# Patient Record
Sex: Female | Born: 1940
Health system: Southern US, Community
[De-identification: ages and names within clinical notes are randomized; demographics above are authoritative.]

## PROBLEM LIST (undated history)

## (undated) DIAGNOSIS — Z923 Personal history of irradiation: Secondary | ICD-10-CM

## (undated) DIAGNOSIS — R7309 Other abnormal glucose: Secondary | ICD-10-CM

## (undated) DIAGNOSIS — I10 Essential (primary) hypertension: Secondary | ICD-10-CM

## (undated) DIAGNOSIS — K219 Gastro-esophageal reflux disease without esophagitis: Secondary | ICD-10-CM

## (undated) DIAGNOSIS — E785 Hyperlipidemia, unspecified: Secondary | ICD-10-CM

## (undated) DIAGNOSIS — H269 Unspecified cataract: Secondary | ICD-10-CM

## (undated) DIAGNOSIS — D219 Benign neoplasm of connective and other soft tissue, unspecified: Secondary | ICD-10-CM

## (undated) DIAGNOSIS — M8580 Other specified disorders of bone density and structure, unspecified site: Secondary | ICD-10-CM

## (undated) DIAGNOSIS — C9110 Chronic lymphocytic leukemia of B-cell type not having achieved remission: Secondary | ICD-10-CM

## (undated) DIAGNOSIS — C8590 Non-Hodgkin lymphoma, unspecified, unspecified site: Secondary | ICD-10-CM

## (undated) HISTORY — DX: Hyperlipidemia, unspecified: E78.5

## (undated) HISTORY — DX: Other specified disorders of bone density and structure, unspecified site: M85.80

## (undated) HISTORY — DX: Gastro-esophageal reflux disease without esophagitis: K21.9

## (undated) HISTORY — PX: ABDOMINAL HYSTERECTOMY: SHX81

## (undated) HISTORY — PX: TONSILLECTOMY AND ADENOIDECTOMY: SUR1326

## (undated) HISTORY — DX: Unspecified cataract: H26.9

## (undated) HISTORY — DX: Chronic lymphocytic leukemia of B-cell type not having achieved remission: C91.10

## (undated) HISTORY — DX: Benign neoplasm of connective and other soft tissue, unspecified: D21.9

## (undated) HISTORY — DX: Other abnormal glucose: R73.09

## (undated) HISTORY — DX: Essential (primary) hypertension: I10

## (undated) HISTORY — PX: APPENDECTOMY: SHX54

---

## 1997-10-28 ENCOUNTER — Ambulatory Visit (HOSPITAL_COMMUNITY): Admission: RE | Admit: 1997-10-28 | Discharge: 1997-10-28 | Payer: Self-pay | Admitting: Gastroenterology

## 1997-11-05 ENCOUNTER — Ambulatory Visit (HOSPITAL_COMMUNITY): Admission: RE | Admit: 1997-11-05 | Discharge: 1997-11-05 | Payer: Self-pay | Admitting: Obstetrics and Gynecology

## 1998-11-16 ENCOUNTER — Ambulatory Visit (HOSPITAL_COMMUNITY): Admission: RE | Admit: 1998-11-16 | Discharge: 1998-11-16 | Payer: Self-pay | Admitting: Obstetrics and Gynecology

## 1998-11-16 ENCOUNTER — Encounter: Payer: Self-pay | Admitting: Obstetrics and Gynecology

## 1999-11-17 ENCOUNTER — Ambulatory Visit (HOSPITAL_COMMUNITY): Admission: RE | Admit: 1999-11-17 | Discharge: 1999-11-17 | Payer: Self-pay | Admitting: Obstetrics and Gynecology

## 1999-11-17 ENCOUNTER — Encounter: Payer: Self-pay | Admitting: Obstetrics and Gynecology

## 1999-11-29 ENCOUNTER — Other Ambulatory Visit: Admission: RE | Admit: 1999-11-29 | Discharge: 1999-11-29 | Payer: Self-pay | Admitting: Obstetrics and Gynecology

## 2000-10-19 ENCOUNTER — Encounter: Payer: Self-pay | Admitting: Obstetrics and Gynecology

## 2000-10-19 ENCOUNTER — Encounter: Admission: RE | Admit: 2000-10-19 | Discharge: 2000-10-19 | Payer: Self-pay | Admitting: Obstetrics and Gynecology

## 2000-11-23 ENCOUNTER — Encounter: Payer: Self-pay | Admitting: Obstetrics and Gynecology

## 2000-11-23 ENCOUNTER — Ambulatory Visit (HOSPITAL_COMMUNITY): Admission: RE | Admit: 2000-11-23 | Discharge: 2000-11-23 | Payer: Self-pay | Admitting: Obstetrics and Gynecology

## 2001-11-15 ENCOUNTER — Other Ambulatory Visit: Admission: RE | Admit: 2001-11-15 | Discharge: 2001-11-15 | Payer: Self-pay | Admitting: Obstetrics and Gynecology

## 2001-11-26 ENCOUNTER — Ambulatory Visit (HOSPITAL_COMMUNITY): Admission: RE | Admit: 2001-11-26 | Discharge: 2001-11-26 | Payer: Self-pay | Admitting: Obstetrics and Gynecology

## 2001-11-26 ENCOUNTER — Encounter: Payer: Self-pay | Admitting: Obstetrics and Gynecology

## 2001-12-10 ENCOUNTER — Ambulatory Visit (HOSPITAL_COMMUNITY): Admission: RE | Admit: 2001-12-10 | Discharge: 2001-12-10 | Payer: Self-pay | Admitting: Gastroenterology

## 2002-11-29 ENCOUNTER — Encounter: Payer: Self-pay | Admitting: Obstetrics and Gynecology

## 2002-11-29 ENCOUNTER — Ambulatory Visit (HOSPITAL_COMMUNITY): Admission: RE | Admit: 2002-11-29 | Discharge: 2002-11-29 | Payer: Self-pay | Admitting: Obstetrics and Gynecology

## 2003-12-03 ENCOUNTER — Ambulatory Visit (HOSPITAL_COMMUNITY): Admission: RE | Admit: 2003-12-03 | Discharge: 2003-12-03 | Payer: Self-pay | Admitting: Obstetrics and Gynecology

## 2004-12-02 ENCOUNTER — Ambulatory Visit (HOSPITAL_COMMUNITY): Admission: RE | Admit: 2004-12-02 | Discharge: 2004-12-02 | Payer: Self-pay | Admitting: Internal Medicine

## 2005-08-02 ENCOUNTER — Encounter: Admission: RE | Admit: 2005-08-02 | Discharge: 2005-08-02 | Payer: Self-pay | Admitting: Obstetrics and Gynecology

## 2005-10-01 ENCOUNTER — Emergency Department (HOSPITAL_COMMUNITY): Admission: EM | Admit: 2005-10-01 | Discharge: 2005-10-01 | Payer: Self-pay | Admitting: Emergency Medicine

## 2005-12-09 ENCOUNTER — Ambulatory Visit (HOSPITAL_COMMUNITY): Admission: RE | Admit: 2005-12-09 | Discharge: 2005-12-09 | Payer: Self-pay | Admitting: Obstetrics and Gynecology

## 2006-12-15 ENCOUNTER — Ambulatory Visit (HOSPITAL_COMMUNITY): Admission: RE | Admit: 2006-12-15 | Discharge: 2006-12-15 | Payer: Self-pay | Admitting: Obstetrics and Gynecology

## 2007-05-07 ENCOUNTER — Encounter: Admission: RE | Admit: 2007-05-07 | Discharge: 2007-05-07 | Payer: Self-pay | Admitting: Obstetrics and Gynecology

## 2007-12-18 ENCOUNTER — Ambulatory Visit (HOSPITAL_COMMUNITY): Admission: RE | Admit: 2007-12-18 | Discharge: 2007-12-18 | Payer: Self-pay | Admitting: Obstetrics and Gynecology

## 2008-12-18 ENCOUNTER — Ambulatory Visit (HOSPITAL_COMMUNITY): Admission: RE | Admit: 2008-12-18 | Discharge: 2008-12-18 | Payer: Self-pay | Admitting: Obstetrics and Gynecology

## 2009-12-21 ENCOUNTER — Ambulatory Visit (HOSPITAL_COMMUNITY): Admission: RE | Admit: 2009-12-21 | Discharge: 2009-12-21 | Payer: Self-pay | Admitting: Obstetrics and Gynecology

## 2010-04-04 HISTORY — PX: PAROTID GLAND TUMOR EXCISION: SHX5221

## 2010-06-15 ENCOUNTER — Other Ambulatory Visit (HOSPITAL_COMMUNITY)
Admission: RE | Admit: 2010-06-15 | Discharge: 2010-06-15 | Disposition: A | Discharge status: Home / Self Care | Payer: Medicare Other | Source: Ambulatory Visit | Attending: Otolaryngology | Admitting: Otolaryngology

## 2010-06-15 ENCOUNTER — Other Ambulatory Visit: Payer: Self-pay | Admitting: Otolaryngology

## 2010-06-15 DIAGNOSIS — R220 Localized swelling, mass and lump, head: Secondary | ICD-10-CM | POA: Insufficient documentation

## 2010-06-25 ENCOUNTER — Other Ambulatory Visit: Payer: Self-pay | Admitting: Otolaryngology

## 2010-07-02 ENCOUNTER — Ambulatory Visit
Admission: RE | Admit: 2010-07-02 | Discharge: 2010-07-02 | Disposition: A | Discharge status: Home / Self Care | Payer: Medicare Other | Source: Ambulatory Visit | Attending: Otolaryngology | Admitting: Otolaryngology

## 2010-07-02 MED ORDER — IOHEXOL 300 MG/ML  SOLN
75.0000 mL | Freq: Once | INTRAMUSCULAR | Status: AC | PRN
  Administered 2010-07-02: 75 mL via INTRAVENOUS

## 2010-09-08 ENCOUNTER — Other Ambulatory Visit: Payer: Self-pay | Admitting: Otolaryngology

## 2010-09-08 ENCOUNTER — Other Ambulatory Visit (HOSPITAL_COMMUNITY)
Admission: RE | Admit: 2010-09-08 | Discharge: 2010-09-08 | Disposition: A | Discharge status: Home / Self Care | Payer: Medicare Other | Source: Ambulatory Visit | Attending: Otolaryngology | Admitting: Otolaryngology

## 2010-09-08 DIAGNOSIS — R220 Localized swelling, mass and lump, head: Secondary | ICD-10-CM | POA: Insufficient documentation

## 2010-09-08 DIAGNOSIS — R221 Localized swelling, mass and lump, neck: Secondary | ICD-10-CM | POA: Insufficient documentation

## 2010-10-25 ENCOUNTER — Encounter (HOSPITAL_COMMUNITY)
Admission: RE | Admit: 2010-10-25 | Discharge: 2010-10-25 | Disposition: A | Discharge status: Home / Self Care | Payer: Medicare Other | Source: Ambulatory Visit | Attending: Otolaryngology | Admitting: Otolaryngology

## 2010-10-25 ENCOUNTER — Other Ambulatory Visit (HOSPITAL_COMMUNITY): Payer: Self-pay | Admitting: Otolaryngology

## 2010-10-25 ENCOUNTER — Ambulatory Visit (HOSPITAL_COMMUNITY)
Admission: RE | Admit: 2010-10-25 | Discharge: 2010-10-25 | Disposition: A | Discharge status: Home / Self Care | Payer: Medicare Other | Source: Ambulatory Visit | Attending: Otolaryngology | Admitting: Otolaryngology

## 2010-10-25 DIAGNOSIS — D490 Neoplasm of unspecified behavior of digestive system: Secondary | ICD-10-CM

## 2010-10-25 DIAGNOSIS — Z01812 Encounter for preprocedural laboratory examination: Secondary | ICD-10-CM | POA: Insufficient documentation

## 2010-10-25 DIAGNOSIS — Z01818 Encounter for other preprocedural examination: Secondary | ICD-10-CM | POA: Insufficient documentation

## 2010-10-25 DIAGNOSIS — C768 Malignant neoplasm of other specified ill-defined sites: Secondary | ICD-10-CM | POA: Insufficient documentation

## 2010-10-25 LAB — BASIC METABOLIC PANEL
BUN: 17 mg/dL (ref 6–23)
CO2: 30 mEq/L (ref 19–32)
Calcium: 10.4 mg/dL (ref 8.4–10.5)
Chloride: 102 mEq/L (ref 96–112)
Creatinine, Ser: 1.05 mg/dL (ref 0.50–1.10)
GFR calc Af Amer: >60 mL/min (ref >60)
GFR calc non Af Amer: 52 mL/min — ABNORMAL LOW (ref >60)
Glucose, Bld: 103 mg/dL — ABNORMAL HIGH (ref 70–99)
Potassium: 4.4 mEq/L (ref 3.5–5.1)
Sodium: 141 mEq/L (ref 135–145)

## 2010-10-25 LAB — CBC
HCT: 43.9 % (ref 36.0–46.0)
Hemoglobin: 15.3 g/dL — ABNORMAL HIGH (ref 12.0–15.0)
MCH: 31.2 pg (ref 26.0–34.0)
MCHC: 34.9 g/dL (ref 30.0–36.0)
MCV: 89.6 fL (ref 78.0–100.0)
Platelets: 188 10*3/uL (ref 150–400)
RBC: 4.9 MIL/uL (ref 3.87–5.11)
RDW: 13.1 % (ref 11.5–15.5)
WBC: 6.1 10*3/uL (ref 4.0–10.5)

## 2010-10-25 LAB — SURGICAL PCR SCREEN
MRSA, PCR: NEGATIVE
Staphylococcus aureus: NEGATIVE

## 2010-11-03 ENCOUNTER — Other Ambulatory Visit: Payer: Self-pay | Admitting: Otolaryngology

## 2010-11-03 ENCOUNTER — Ambulatory Visit (HOSPITAL_COMMUNITY)
Admission: RE | Admit: 2010-11-03 | Discharge: 2010-11-04 | Disposition: A | Discharge status: Home / Self Care | Payer: Medicare Other | Source: Ambulatory Visit | Attending: Otolaryngology | Admitting: Otolaryngology

## 2010-11-03 DIAGNOSIS — Z01812 Encounter for preprocedural laboratory examination: Secondary | ICD-10-CM | POA: Insufficient documentation

## 2010-11-03 DIAGNOSIS — Z01810 Encounter for preprocedural cardiovascular examination: Secondary | ICD-10-CM | POA: Insufficient documentation

## 2010-11-03 DIAGNOSIS — I10 Essential (primary) hypertension: Secondary | ICD-10-CM | POA: Insufficient documentation

## 2010-11-03 DIAGNOSIS — D119 Benign neoplasm of major salivary gland, unspecified: Secondary | ICD-10-CM | POA: Insufficient documentation

## 2010-11-03 DIAGNOSIS — Z01818 Encounter for other preprocedural examination: Secondary | ICD-10-CM | POA: Insufficient documentation

## 2010-12-07 NOTE — Op Note (Signed)
NAMESELEEN, WALTER                   ACCOUNT NO.:  [id number]  MEDICAL RECORD NO.:  [id number]  LOCATION:  5127                         FACILITY:  [facility]  PHYSICIAN:  Gloris Manchester. Lazarus Salines, M.D. DATE OF BIRTH:  [birth year]  DATE OF PROCEDURE:  11/03/2010 DATE OF DISCHARGE:                              OPERATIVE REPORT   PREOPERATIVE DIAGNOSIS:  Right parotid tumor.  POSTOPERATIVE DIAGNOSIS:  Right parotid tumor.  PROCEDURE PERFORMED:  Right superficial parotidectomy with facial nerve preservation.  SURGEON:  Gloris Manchester. Lazarus Salines, MD  ASSISTANT:  Aquilla Hacker, [facility]  ANESTHESIA:  General orotracheal.  BLOOD LOSS:  Less than 20 mL.  COMPLICATIONS:  None.  FINDINGS:  A 2.5-cm rubbery tumor in the tail of the right parotid gland.  Some fatty transformation of the glandular tissue.  Facial nerve identified and preserved.  PROCEDURE:  With the patient in a comfortable supine position, general orotracheal anesthesia was induced without difficulty.  At an appropriate level, the patient was placed in a slight reverse Trendelenburg.  The head was rotated to the left for access to the right neck.  A shoulder roll was placed and the neck was extended and the head supported in the standard fashion.  The NIMS system was applied in the standard fashion.  The neck was palpated with the findings as described above.  1% Xylocaine with 1:100,000 epinephrine, 7 mL total was infiltrated along the proposed wound line for intraoperative hemostasis. Several minutes were allowed for this to take effect.  A sterile preparation and draping of the right neck was accomplished in the standard fashion.  Crosshatches were applied on the transverse portion of the wound and at the ear lobule.  A modified Blair incision was sharply executed and carried down through the skin and subcutaneous fat.  The periparotid plane was dissected forward.  The skin flaps were dissected off the sternocleidomastoid muscle  inferiorly and posteriorly.  The earlobe was developed from the cartilaginous canal.  The earlobe was held backwards and the anterior flap was held forward with 2-0 silk tieback sutures.  Using the [facility] thermal scalpel, the plane in front of the cartilaginous canal was developed on a broad front up into the preauricular region, down behind the small tail of the gland and then down to the sternocleidomastoid muscle.  The tail of the gland was developed forward and the parotid gland including tumor rolled forward off the sternocleidomastoid muscle.  Working on a broad front, the plane was deepened to the bony canal.  Vessels were controlled with bipolar cautery or with a Shaw scalpel as identified.  Digastric muscle was identified.  Working in the notch between the external canal and the mastoid bone, the facial nerve was identified.  Using moist sponges and the [facility], the nerve was dissected forward and the midportion of the gland was incised using the Shaw scalpel.  The pes anserinus was identified.  The location of the tumor was such that it was felt to be appropriate to dissect along the inferior branch forward and leave the superior gland and branch alone.  The superior-most branch of the inferior branch was developed forward using the [facility] scalpel and  the gland was rolled downward away from the branch.  A second branch in the same area was developed in the same fashion.  A more inferior branch was developed and finally a fourth branch probably the cervical branch was developed forward.  Hemostasis was achieved with a Shaw scalpel, and with occasional small arteries and veins with a 3-0 silk ligature.  Care was taken not to violate the tumor which was quite anteroinferior in the gland.  The gland and tumor itself were developed forward off the sternocleidomastoid muscle and finally off the deep lobe of the parotid gland keeping the dissected facial nerve in direct vision.   The anterior aspect of the gland was dissected downward off the mastoid muscle and communicated with the inferior portion and then the specimen was fully excised.  The tumor was intact and had not been violated.  The facial nerve was also intact. Hemostasis was observed.  The specimen was sent for permanent pathologic interpretation.  The wound was irrigated.  Valsalva revealed no further bleeding.  A 10- French drain was placed in the depth of the wound.  The wound was reapproximated with interrupted 4-0 chromic suture in the subcutaneous layer, the drain was secured with a 3-0 nylon stitch, and the wound was closed in a cosmetic fashion with a running simple 5-0 Ethilon.  A good cosmetic reapproximation was accomplished.  The bulk of the gland was allowed to remain behind and a cosmetic contour of the cheek was excellent.  Hemostasis was observed and the drain was functioning adequately.  At this point, the procedure was completed.  The patient was returned to Anesthesia, awakened, extubated, and transferred to recovery in stable condition.  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 Anticipate routine postoperative recovery with attention to ice, elevation, analgesia, and suction drainage x24 hours.     Gloris Manchester. Lazarus Salines, M.D.     KTW/MEDQ  D:  11/03/2010  T:  11/03/2010  Job:  [id number]  cc:   Lovenia Kim, D.O.  Electronically Signed by Flo Shanks M.D. on 12/07/2010 09:55:12 AM

## 2010-12-13 ENCOUNTER — Other Ambulatory Visit (HOSPITAL_COMMUNITY): Payer: Self-pay | Admitting: Internal Medicine

## 2010-12-13 DIAGNOSIS — Z139 Encounter for screening, unspecified: Secondary | ICD-10-CM

## 2010-12-23 ENCOUNTER — Ambulatory Visit (HOSPITAL_COMMUNITY)
Admission: RE | Admit: 2010-12-23 | Discharge: 2010-12-23 | Disposition: A | Discharge status: Home / Self Care | Payer: Medicare Other | Source: Ambulatory Visit | Attending: Internal Medicine | Admitting: Internal Medicine

## 2010-12-23 DIAGNOSIS — Z139 Encounter for screening, unspecified: Secondary | ICD-10-CM

## 2010-12-23 DIAGNOSIS — Z1231 Encounter for screening mammogram for malignant neoplasm of breast: Secondary | ICD-10-CM | POA: Insufficient documentation

## 2011-03-05 LAB — HM PAP SMEAR: HM Pap smear: NORMAL

## 2011-05-31 ENCOUNTER — Ambulatory Visit (INDEPENDENT_AMBULATORY_CARE_PROVIDER_SITE_OTHER): Payer: Medicare Other | Admitting: Obstetrics and Gynecology

## 2011-05-31 DIAGNOSIS — Z124 Encounter for screening for malignant neoplasm of cervix: Secondary | ICD-10-CM

## 2011-11-25 ENCOUNTER — Other Ambulatory Visit (HOSPITAL_COMMUNITY): Payer: Self-pay | Admitting: Internal Medicine

## 2011-11-25 DIAGNOSIS — Z1231 Encounter for screening mammogram for malignant neoplasm of breast: Secondary | ICD-10-CM

## 2011-12-04 LAB — HM MAMMOGRAPHY: HM Mammogram: NORMAL

## 2011-12-26 ENCOUNTER — Ambulatory Visit (HOSPITAL_COMMUNITY)
Admission: RE | Admit: 2011-12-26 | Discharge: 2011-12-26 | Disposition: A | Discharge status: Home / Self Care | Payer: Medicare Other | Source: Ambulatory Visit | Attending: Internal Medicine | Admitting: Internal Medicine

## 2011-12-26 DIAGNOSIS — Z1231 Encounter for screening mammogram for malignant neoplasm of breast: Secondary | ICD-10-CM | POA: Insufficient documentation

## 2012-04-04 LAB — HM DEXA SCAN

## 2012-04-19 LAB — HM COLONOSCOPY

## 2012-11-21 ENCOUNTER — Other Ambulatory Visit (HOSPITAL_COMMUNITY): Payer: Self-pay | Admitting: Internal Medicine

## 2012-11-21 DIAGNOSIS — Z1231 Encounter for screening mammogram for malignant neoplasm of breast: Secondary | ICD-10-CM

## 2012-12-26 ENCOUNTER — Ambulatory Visit (HOSPITAL_COMMUNITY)
Admission: RE | Admit: 2012-12-26 | Discharge: 2012-12-26 | Disposition: A | Discharge status: Home / Self Care | Payer: Medicare Other | Source: Ambulatory Visit | Attending: Internal Medicine | Admitting: Internal Medicine

## 2012-12-26 DIAGNOSIS — Z1231 Encounter for screening mammogram for malignant neoplasm of breast: Secondary | ICD-10-CM

## 2013-02-22 ENCOUNTER — Encounter: Payer: Self-pay | Admitting: *Deleted

## 2013-02-22 DIAGNOSIS — I10 Essential (primary) hypertension: Secondary | ICD-10-CM | POA: Insufficient documentation

## 2013-02-22 DIAGNOSIS — K219 Gastro-esophageal reflux disease without esophagitis: Secondary | ICD-10-CM | POA: Insufficient documentation

## 2013-02-22 DIAGNOSIS — M8580 Other specified disorders of bone density and structure, unspecified site: Secondary | ICD-10-CM | POA: Insufficient documentation

## 2013-02-22 DIAGNOSIS — E785 Hyperlipidemia, unspecified: Secondary | ICD-10-CM | POA: Insufficient documentation

## 2013-02-22 DIAGNOSIS — R7309 Other abnormal glucose: Secondary | ICD-10-CM | POA: Insufficient documentation

## 2013-02-25 ENCOUNTER — Ambulatory Visit: Payer: Medicare Other | Admitting: Emergency Medicine

## 2013-02-25 ENCOUNTER — Encounter: Payer: Self-pay | Admitting: Emergency Medicine

## 2013-02-25 VITALS — BP 144/82 | HR 68 | Temp 97.8°F | Resp 16 | Ht 60.24 in | Wt 153.0 lb

## 2013-02-25 DIAGNOSIS — I10 Essential (primary) hypertension: Secondary | ICD-10-CM

## 2013-02-25 DIAGNOSIS — R7309 Other abnormal glucose: Secondary | ICD-10-CM

## 2013-02-25 DIAGNOSIS — E538 Deficiency of other specified B group vitamins: Secondary | ICD-10-CM

## 2013-02-25 DIAGNOSIS — E559 Vitamin D deficiency, unspecified: Secondary | ICD-10-CM

## 2013-02-25 DIAGNOSIS — Z0000 Encounter for general adult medical examination without abnormal findings: Secondary | ICD-10-CM

## 2013-02-25 DIAGNOSIS — E782 Mixed hyperlipidemia: Secondary | ICD-10-CM

## 2013-02-25 DIAGNOSIS — R5381 Other malaise: Secondary | ICD-10-CM

## 2013-02-25 LAB — VITAMIN B12: Vitamin B-12: 503 pg/mL (ref 211–911)

## 2013-02-25 LAB — BASIC METABOLIC PANEL WITH GFR
BUN: 16 mg/dL (ref 6–23)
CO2: 27 mEq/L (ref 19–32)
Calcium: 10.5 mg/dL (ref 8.4–10.5)
Chloride: 103 mEq/L (ref 96–112)
Creat: 1.08 mg/dL (ref 0.50–1.10)
GFR, Est African American: 59 mL/min — ABNORMAL LOW
GFR, Est Non African American: 51 mL/min — ABNORMAL LOW
Glucose, Bld: 93 mg/dL (ref 70–99)
Potassium: 4.4 mEq/L (ref 3.5–5.3)
Sodium: 140 mEq/L (ref 135–145)

## 2013-02-25 LAB — CBC WITH DIFFERENTIAL/PLATELET
Basophils Absolute: 0 10*3/uL (ref 0.0–0.1)
Basophils Relative: 1 % (ref 0–1)
Eosinophils Absolute: 0.1 10*3/uL (ref 0.0–0.7)
Eosinophils Relative: 2 % (ref 0–5)
HCT: 43 % (ref 36.0–46.0)
Hemoglobin: 14.9 g/dL (ref 12.0–15.0)
Lymphocytes Relative: 46 % (ref 12–46)
Lymphs Abs: 2.2 10*3/uL (ref 0.7–4.0)
MCH: 30.2 pg (ref 26.0–34.0)
MCHC: 34.7 g/dL (ref 30.0–36.0)
MCV: 87 fL (ref 78.0–100.0)
Monocytes Absolute: 0.5 10*3/uL (ref 0.1–1.0)
Monocytes Relative: 11 % (ref 3–12)
Neutro Abs: 2 10*3/uL (ref 1.7–7.7)
Neutrophils Relative %: 40 % — ABNORMAL LOW (ref 43–77)
Platelets: 190 10*3/uL (ref 150–400)
RBC: 4.94 MIL/uL (ref 3.87–5.11)
RDW: 14 % (ref 11.5–15.5)
WBC: 4.9 10*3/uL (ref 4.0–10.5)

## 2013-02-25 LAB — HEPATIC FUNCTION PANEL
ALT: 17 U/L (ref 0–35)
AST: 21 U/L (ref 0–37)
Albumin: 4.8 g/dL (ref 3.5–5.2)
Alkaline Phosphatase: 70 U/L (ref 39–117)
Bilirubin, Direct: 0.1 mg/dL (ref 0.0–0.3)
Indirect Bilirubin: 0.6 mg/dL (ref 0.0–0.9)
Total Bilirubin: 0.7 mg/dL (ref 0.3–1.2)
Total Protein: 7.3 g/dL (ref 6.0–8.3)

## 2013-02-25 LAB — HEMOGLOBIN A1C
Hgb A1c MFr Bld: 5.8 % — ABNORMAL HIGH (ref <5.7)
Mean Plasma Glucose: 120 mg/dL — ABNORMAL HIGH (ref <117)

## 2013-02-25 LAB — LIPID PANEL
Cholesterol: 191 mg/dL (ref 0–200)
HDL: 55 mg/dL (ref >39)
LDL Cholesterol: 112 mg/dL — ABNORMAL HIGH (ref 0–99)
Total CHOL/HDL Ratio: 3.5 Ratio
Triglycerides: 121 mg/dL (ref <150)
VLDL: 24 mg/dL (ref 0–40)

## 2013-02-25 LAB — MAGNESIUM: Magnesium: 2.3 mg/dL (ref 1.5–2.5)

## 2013-02-25 LAB — TSH: TSH: 1.579 u[IU]/mL (ref 0.350–4.500)

## 2013-02-25 NOTE — Patient Instructions (Signed)
Fat and Cholesterol Control Diet  Fat and cholesterol levels in your blood and organs are influenced by your diet. High levels of fat and cholesterol may lead to diseases of the heart, small and large blood vessels, gallbladder, liver, and pancreas.  CONTROLLING FAT AND CHOLESTEROL WITH DIET  Although exercise and lifestyle factors are important, your diet is key. That is because certain foods are known to raise cholesterol and others to lower it. The goal is to balance foods for their effect on cholesterol and more importantly, to replace saturated and trans fat with other types of fat, such as monounsaturated fat, polyunsaturated fat, and omega-3 fatty acids.  On average, a person should consume no more than 15 to 17 g of saturated fat daily. Saturated and trans fats are considered "bad" fats, and they will raise LDL cholesterol. Saturated fats are primarily found in animal products such as meats, butter, and cream. However, that does not mean you need to give up all your favorite foods. Today, there are good tasting, low-fat, low-cholesterol substitutes for most of the things you like to eat. Choose low-fat or nonfat alternatives. Choose round or loin cuts of red meat. These types of cuts are lowest in fat and cholesterol. Chicken (without the skin), fish, veal, and ground turkey breast are great choices. Eliminate fatty meats, such as hot dogs and salami. Even shellfish have little or no saturated fat. Have a 3 oz (85 g) portion when you eat lean meat, poultry, or fish.  Trans fats are also called "partially hydrogenated oils." They are oils that have been scientifically manipulated so that they are solid at room temperature resulting in a longer shelf life and improved taste and texture of foods in which they are added. Trans fats are found in stick margarine, some tub margarines, cookies, crackers, and baked goods.   When baking and cooking, oils are a great substitute for butter. The monounsaturated oils are  especially beneficial since it is believed they lower LDL and raise HDL. The oils you should avoid entirely are saturated tropical oils, such as coconut and palm.   Remember to eat a lot from food groups that are naturally free of saturated and trans fat, including fish, fruit, vegetables, beans, grains (barley, rice, couscous, bulgur wheat), and pasta (without cream sauces).   IDENTIFYING FOODS THAT LOWER FAT AND CHOLESTEROL   Soluble fiber may lower your cholesterol. This type of fiber is found in fruits such as apples, vegetables such as broccoli, potatoes, and carrots, legumes such as beans, peas, and lentils, and grains such as barley. Foods fortified with plant sterols (phytosterol) may also lower cholesterol. You should eat at least 2 g per day of these foods for a cholesterol lowering effect.   Read package labels to identify low-saturated fats, trans fat free, and low-fat foods at the supermarket. Select cheeses that have only 2 to 3 g saturated fat per ounce. Use a heart-healthy tub margarine that is free of trans fats or partially hydrogenated oil. When buying baked goods (cookies, crackers), avoid partially hydrogenated oils. Breads and muffins should be made from whole grains (whole-wheat or whole oat flour, instead of "flour" or "enriched flour"). Buy non-creamy canned soups with reduced salt and no added fats.   FOOD PREPARATION TECHNIQUES   Never deep-fry. If you must fry, either stir-fry, which uses very little fat, or use non-stick cooking sprays. When possible, broil, bake, or roast meats, and steam vegetables. Instead of putting butter or margarine on vegetables, use lemon   and herbs, applesauce, and cinnamon (for squash and sweet potatoes). Use nonfat yogurt, salsa, and low-fat dressings for salads.   LOW-SATURATED FAT / LOW-FAT FOOD SUBSTITUTES  Meats / Saturated Fat (g)  · Avoid: Steak, marbled (3 oz/85 g) / 11 g  · Choose: Steak, lean (3 oz/85 g) / 4 g  · Avoid: Hamburger (3 oz/85 g) / 7  g  · Choose: Hamburger, lean (3 oz/85 g) / 5 g  · Avoid: Ham (3 oz/85 g) / 6 g  · Choose: Ham, lean cut (3 oz/85 g) / 2.4 g  · Avoid: Chicken, with skin, dark meat (3 oz/85 g) / 4 g  · Choose: Chicken, skin removed, dark meat (3 oz/85 g) / 2 g  · Avoid: Chicken, with skin, light meat (3 oz/85 g) / 2.5 g  · Choose: Chicken, skin removed, light meat (3 oz/85 g) / 1 g  Dairy / Saturated Fat (g)  · Avoid: Whole milk (1 cup) / 5 g  · Choose: Low-fat milk, 2% (1 cup) / 3 g  · Choose: Low-fat milk, 1% (1 cup) / 1.5 g  · Choose: Skim milk (1 cup) / 0.3 g  · Avoid: Hard cheese (1 oz/28 g) / 6 g  · Choose: Skim milk cheese (1 oz/28 g) / 2 to 3 g  · Avoid: Cottage cheese, 4% fat (1 cup) / 6.5 g  · Choose: Low-fat cottage cheese, 1% fat (1 cup) / 1.5 g  · Avoid: Ice cream (1 cup) / 9 g  · Choose: Sherbet (1 cup) / 2.5 g  · Choose: Nonfat frozen yogurt (1 cup) / 0.3 g  · Choose: Frozen fruit bar / trace  · Avoid: Whipped cream (1 tbs) / 3.5 g  · Choose: Nondairy whipped topping (1 tbs) / 1 g  Condiments / Saturated Fat (g)  · Avoid: Mayonnaise (1 tbs) / 2 g  · Choose: Low-fat mayonnaise (1 tbs) / 1 g  · Avoid: Butter (1 tbs) / 7 g  · Choose: Extra light margarine (1 tbs) / 1 g  · Avoid: Coconut oil (1 tbs) / 11.8 g  · Choose: Olive oil (1 tbs) / 1.8 g  · Choose: Corn oil (1 tbs) / 1.7 g  · Choose: Safflower oil (1 tbs) / 1.2 g  · Choose: Sunflower oil (1 tbs) / 1.4 g  · Choose: Soybean oil (1 tbs) / 2.4 g  · Choose: Canola oil (1 tbs) / 1 g  Document Released: 03/21/2005 Document Revised: 07/16/2012 Document Reviewed: 09/09/2010  Exit[employer]® Patient Information ©2014 [employer].

## 2013-02-26 LAB — URINALYSIS, MICROSCOPIC ONLY
Casts: NONE SEEN
Crystals: NONE SEEN

## 2013-02-26 LAB — URINALYSIS, ROUTINE W REFLEX MICROSCOPIC
Bilirubin Urine: NEGATIVE
Glucose, UA: NEGATIVE mg/dL
Hgb urine dipstick: NEGATIVE
Ketones, ur: NEGATIVE mg/dL
Nitrite: NEGATIVE
Protein, ur: NEGATIVE mg/dL
Specific Gravity, Urine: 1.016 (ref 1.005–1.030)
Urobilinogen, UA: 0.2 mg/dL (ref 0.0–1.0)
pH: 6.5 (ref 5.0–8.0)

## 2013-02-26 LAB — INSULIN, FASTING: Insulin fasting, serum: 20 u[IU]/mL (ref 3–28)

## 2013-02-26 LAB — MICROALBUMIN / CREATININE URINE RATIO
Creatinine, Urine: 130.2 mg/dL
Microalb Creat Ratio: 3.8 mg/g (ref 0.0–30.0)
Microalb, Ur: 0.5 mg/dL (ref 0.00–1.89)

## 2013-02-26 LAB — VITAMIN D 25 HYDROXY (VIT D DEFICIENCY, FRACTURES): Vit D, 25-Hydroxy: 47 ng/mL (ref 30–89)

## 2013-03-02 NOTE — Progress Notes (Signed)
Subjective:    Patient ID: Jacqueline Hancock, female    DOB: 1940-07-24, 72 y.o.   MRN: [id number]  HPI Comments: 72 yo female CPE and presents for 3 month F/U for HTN, Cholesterol, Pre-Dm, D. Deficient. She has been doing well overall. Denies any falls. She notes BP and BS all good at home. She does have mild fatigue but thinks may be from stopping vitamins.   Hypertension  Hyperlipidemia    Current Outpatient Prescriptions on File Prior to Visit  Medication Sig Dispense Refill  . amLODipine (NORVASC) 5 MG tablet Take 5 mg by mouth daily.      . Ascorbic Acid (VITAMIN C) 1000 MG tablet Take 1,000 mg by mouth daily.      Marland Kitchen aspirin 81 MG tablet Take 81 mg by mouth daily.      . Cholecalciferol (VITAMIN D) 2000 UNITS tablet Take 2,000 Units by mouth 2 (two) times daily.      . Omega-3 Fatty Acids (FISH OIL) 1000 MG CAPS Take by mouth 2 (two) times daily.      Marland Kitchen omeprazole (PRILOSEC) 20 MG capsule Take 20 mg by mouth daily.       No current facility-administered medications on file prior to visit.   ALLERGIES Nexium and Ranitidine  Past Medical History  Diagnosis Date  . Hypertension   . Hyperlipidemia   . Elevated hemoglobin A1c   . GERD (gastroesophageal reflux disease)   . Osteopenia   . Fibroids   . Early cataracts, bilateral     Past Surgical History  Procedure Laterality Date  . 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 Abdominal hysterectomy    . Appendectomy    . Tonsillectomy and adenoidectomy      History  Substance Use Topics  . Smoking status: Never Smoker   . Smokeless tobacco: Not on file  . Alcohol Use: Not on file    Family History  Problem Relation Age of Onset  . Leukemia Mother   . Hypertension Father   . Diabetes Sister   . Heart disease Brother   . Diabetes Brother      Review of Systems  Constitutional: Positive for fatigue.  HENT:       Dr. Lazarus Salines- prn  Eyes:       Dr Nile Riggs- due  Gastrointestinal:       Dr. Kinnie Scales due 2019   Genitourinary:       GYN Dr. Penni Bombard- prn  All other systems reviewed and are negative.    BP 144/82  Pulse 68  Temp(Src) 97.8 F (36.6 C) (Temporal)  Resp 16  Ht 5' 0.24" (1.53 m)  Wt 153 lb (69.4 kg)  BMI 29.65 kg/m2     Objective:   Physical Exam  Nursing note and vitals reviewed. Constitutional: She is oriented to person, place, and time. She appears well-developed and well-nourished.  HENT:  Head: Normocephalic and atraumatic.  Right Ear: External ear normal.  Left Ear: External ear normal.  Nose: Nose normal.  Mouth/Throat: Oropharynx is clear and moist. No oropharyngeal exudate.  Eyes: Conjunctivae and EOM are normal. Pupils are equal, round, and reactive to light. No scleral icterus.  Neck: Normal range of motion. Neck supple. No JVD present. No tracheal deviation present. No thyromegaly present.  Cardiovascular: Normal rate, regular rhythm, normal heart sounds and intact distal pulses.   Pulmonary/Chest: Breath sounds normal.  Abdominal: Soft. Bowel sounds are normal. She exhibits no distension and no mass.  There is no tenderness. There is no rebound and no guarding.  Genitourinary:  Defer GYN  Musculoskeletal: Normal range of motion. She exhibits no edema and no tenderness.  Crepitus bil knees  Lymphadenopathy:    She has no cervical adenopathy.  Neurological: She is alert and oriented to person, place, and time. She has normal reflexes. No cranial nerve deficit. She exhibits normal muscle tone. Coordination normal.  Skin: Skin is warm and dry. No rash noted. No erythema. No pallor.  Psychiatric: She has a normal mood and affect. Her behavior is normal. Judgment and thought content normal.          Assessment & Plan:  1. CPE and 3 month F/U for HTN, Cholesterol, Pre-Dm, D. Deficient- check labs 2. Anemia/ fatigue check labs

## 2013-03-04 LAB — TB SKIN TEST
Induration: 0 mm
TB Skin Test: NEGATIVE

## 2013-03-25 ENCOUNTER — Other Ambulatory Visit: Payer: Medicare Other | Admitting: Internal Medicine

## 2013-03-25 DIAGNOSIS — I10 Essential (primary) hypertension: Secondary | ICD-10-CM

## 2013-03-25 DIAGNOSIS — R7309 Other abnormal glucose: Secondary | ICD-10-CM

## 2013-03-25 DIAGNOSIS — E538 Deficiency of other specified B group vitamins: Secondary | ICD-10-CM

## 2013-03-25 DIAGNOSIS — R5381 Other malaise: Secondary | ICD-10-CM

## 2013-03-25 DIAGNOSIS — Z0000 Encounter for general adult medical examination without abnormal findings: Secondary | ICD-10-CM

## 2013-03-25 DIAGNOSIS — E559 Vitamin D deficiency, unspecified: Secondary | ICD-10-CM

## 2013-03-25 DIAGNOSIS — E782 Mixed hyperlipidemia: Secondary | ICD-10-CM

## 2013-03-25 LAB — POC HEMOCCULT BLD/STL (HOME/3-CARD/SCREEN)
Card #2 Fecal Occult Blod, POC: NEGATIVE
Card #3 Fecal Occult Blood, POC: NEGATIVE
Fecal Occult Blood, POC: NEGATIVE

## 2013-05-24 ENCOUNTER — Other Ambulatory Visit: Payer: Self-pay | Admitting: Internal Medicine

## 2013-06-20 ENCOUNTER — Ambulatory Visit (INDEPENDENT_AMBULATORY_CARE_PROVIDER_SITE_OTHER): Payer: Medicare Other | Admitting: Internal Medicine

## 2013-06-20 ENCOUNTER — Encounter: Payer: Self-pay | Admitting: Internal Medicine

## 2013-06-20 VITALS — BP 118/62 | HR 72 | Temp 97.5°F | Resp 18 | Ht 60.5 in | Wt 151.2 lb

## 2013-06-20 DIAGNOSIS — I10 Essential (primary) hypertension: Secondary | ICD-10-CM

## 2013-06-20 DIAGNOSIS — E785 Hyperlipidemia, unspecified: Secondary | ICD-10-CM

## 2013-06-20 DIAGNOSIS — E559 Vitamin D deficiency, unspecified: Secondary | ICD-10-CM

## 2013-06-20 DIAGNOSIS — Z79899 Other long term (current) drug therapy: Secondary | ICD-10-CM | POA: Insufficient documentation

## 2013-06-20 DIAGNOSIS — R7309 Other abnormal glucose: Secondary | ICD-10-CM

## 2013-06-20 LAB — CBC WITH DIFFERENTIAL/PLATELET
Basophils Absolute: 0 10*3/uL (ref 0.0–0.1)
Basophils Relative: 0 % (ref 0–1)
Eosinophils Absolute: 0.1 10*3/uL (ref 0.0–0.7)
Eosinophils Relative: 2 % (ref 0–5)
HCT: 43.2 % (ref 36.0–46.0)
Hemoglobin: 15.1 g/dL — ABNORMAL HIGH (ref 12.0–15.0)
Lymphocytes Relative: 44 % (ref 12–46)
Lymphs Abs: 2 10*3/uL (ref 0.7–4.0)
MCH: 30.5 pg (ref 26.0–34.0)
MCHC: 35 g/dL (ref 30.0–36.0)
MCV: 87.3 fL (ref 78.0–100.0)
Monocytes Absolute: 0.4 10*3/uL (ref 0.1–1.0)
Monocytes Relative: 9 % (ref 3–12)
Neutro Abs: 2.1 10*3/uL (ref 1.7–7.7)
Neutrophils Relative %: 45 % (ref 43–77)
Platelets: 202 10*3/uL (ref 150–400)
RBC: 4.95 MIL/uL (ref 3.87–5.11)
RDW: 14 % (ref 11.5–15.5)
WBC: 4.6 10*3/uL (ref 4.0–10.5)

## 2013-06-20 LAB — HEMOGLOBIN A1C
Hgb A1c MFr Bld: 5.7 % — ABNORMAL HIGH (ref <5.7)
Mean Plasma Glucose: 117 mg/dL — ABNORMAL HIGH (ref <117)

## 2013-06-20 NOTE — Patient Instructions (Signed)

## 2013-06-20 NOTE — Progress Notes (Signed)
Patient ID: Jacqueline Hancock, female   DOB: 1940-06-09, 73 y.o.   MRN: [id number]    This very nice 73 y.o. MBF presents for 3 month follow up with Hypertension, Hyperlipidemia, Pre-Diabetes and Vitamin D Deficiency.    HTN predates since the 1990's.. BP has been controlled at home. Today's BP: 118/62 mmHg . Calculated GFR 51 in Nov 2014 consistant with Stage 3 CKDPatient denies any cardiac type chest pain, palpitations, dyspnea/orthopnea/PND, dizziness, claudication, or dependent edema.   Hyperlipidemia is not controlled with diet. Last lipids as below in Nov 2014 - not at goal.  . Patient denies myalgias or other med SE's.  Lab Results  Component Value Date   CHOL 191 02/25/2013   HDL 55 02/25/2013   LDLCALC 112* 02/25/2013   TRIG 121 02/25/2013   CHOLHDL 3.5 02/25/2013    Also, the patient has history of PreDiabetes since 2011 with last A1c of 5.8% in Nov 2014. Patient denies any symptoms of reactive hypoglycemia, diabetic polys, paresthesias or visual blurring.   Further, Patient has history of Vitamin D Deficiency with last vitamin D of 47 . Patient supplements vitamin D without any suspected side-effects.  Medication Sig  . amLODipine (NORVASC) 5 MG tablet take 1 tablet by mouth once daily  . Ascorbic Acid (VITAMIN C) 1000 MG tablet Take 1,000 mg by mouth daily.  Marland Kitchen aspirin 81 MG tablet Take 81 mg by mouth daily.  . Cholecalciferol (VITAMIN D) 2000 UNITS tablet Take 2,000 Units by mouth 2 (two) times daily.  . Omega-3 Fatty Acids (FISH OIL) 1000 MG CAPS Take by mouth 2 (two) times daily.  Marland Kitchen omeprazole (PRILOSEC) 20 MG capsule Take 20 mg by mouth daily.  . vitamin E 200 UNIT capsule Take 200 Units by mouth daily.      Allergies  Allergen Reactions  . Nexium [Esomeprazole Magnesium]     Headache  . Ranitidine     myalgias    PMHx:   Past Medical History  Diagnosis Date  . Hypertension   . Hyperlipidemia   . Elevated hemoglobin A1c   . GERD (gastroesophageal reflux disease)    . Osteopenia   . Fibroids   . Early cataracts, bilateral     FHx:    Reviewed / unchanged  SHx:    Reviewed / unchanged   Systems Review: Constitutional: Denies fever, chills, wt changes, headaches, insomnia, fatigue, night sweats, change in appetite. Eyes: Denies redness, blurred vision, diplopia, discharge, itchy, watery eyes.  ENT: Denies discharge, congestion, post nasal drip, epistaxis, sore throat, earache, hearing loss, dental pain, tinnitus, vertigo, sinus pain, snoring.  CV: Denies chest pain, palpitations, irregular heartbeat, syncope, dyspnea, diaphoresis, orthopnea, PND, claudication, edema. Respiratory: denies cough, dyspnea, DOE, pleurisy, hoarseness, laryngitis, wheezing.  Gastrointestinal: Denies dysphagia, odynophagia, heartburn, reflux, water brash, abdominal pain or cramps, nausea, vomiting, bloating, diarrhea, constipation, hematemesis, melena, hematochezia,  or hemorrhoids. Genitourinary: Denies dysuria, frequency, urgency, nocturia, hesitancy, discharge, hematuria, flank pain. Musculoskeletal: Denies arthralgias, myalgias, stiffness, jt. swelling, pain, limp, strain/sprain.  Skin: Denies pruritus, rash, hives, warts, acne, eczema, change in skin lesion(s). Neuro: No weakness, tremor, incoordination, spasms, paresthesia, or pain. Psychiatric: Denies confusion, memory loss, or sensory loss. Endo: Denies change in weight, skin, hair change.  Heme/Lymph: No excessive bleeding, bruising, orenlarged lymph nodes.   Exam:  BP 118/62  Pulse 72  Temp(Src) 97.5 F (36.4 C) (Temporal)  Resp 18  Ht 5' 0.5" (1.537 m)  Wt 151 lb 3.2 oz (68.584 kg)  BMI 29.03 kg/m2  Appears well nourished - in no distress. Eyes: PERRLA, EOMs, conjunctiva no swelling or erythema. Sinuses: No frontal/maxillary tenderness ENT/Mouth: EAC's clear, TM's nl w/o erythema, bulging. Nares clear w/o erythema, swelling, exudates. Oropharynx clear without erythema or exudates. Oral hygiene is good.  Tongue normal, non obstructing. Hearing intact.  Neck: Supple. Thyroid nl. Car 2+/2+ without bruits, nodes or JVD. Chest: Respirations nl with BS clear & equal w/o rales, rhonchi, wheezing or stridor.  Cor: Heart sounds normal w/ regular rate and rhythm without sig. murmurs, gallops, clicks, or rubs. Peripheral pulses normal and equal  without edema.  Abdomen: Soft & bowel sounds normal. Non-tender w/o guarding, rebound, hernias, masses, or organomegaly.  Lymphatics: Unremarkable.  Musculoskeletal: Full ROM all peripheral extremities, joint stability, 5/5 strength, and normal gait.  Skin: Warm, dry without exposed rashes, lesions, ecchymosis apparent.  Neuro: Cranial nerves intact, reflexes equal bilaterally. Sensory-motor testing grossly intact. Tendon reflexes grossly intact.  Pysch: Alert & oriented x 3. Insight and judgement nl & appropriate. No ideations.  Assessment and Plan:  1. Hypertension - Continue monitor blood pressure at home. Continue diet/meds same.  2. Hyperlipidemia - Continue diet/meds, exercise,& lifestyle modifications. Continue monitor periodic cholesterol/liver & renal functions   3. Pre-diabetes - Continue diet, exercise, lifestyle modifications. Monitor appropriate labs.  4. Vitamin D Deficiency - Continue supplementation.  Recommended regular exercise, BP monitoring, weight control, and discussed med and SE's. Recommended labs to assess and monitor clinical status. Further disposition pending results of labs.

## 2013-06-21 LAB — BASIC METABOLIC PANEL WITH GFR
BUN: 14 mg/dL (ref 6–23)
CO2: 30 mEq/L (ref 19–32)
Calcium: 10.1 mg/dL (ref 8.4–10.5)
Chloride: 99 mEq/L (ref 96–112)
Creat: 0.98 mg/dL (ref 0.50–1.10)
GFR, Est African American: 67 mL/min
GFR, Est Non African American: 58 mL/min — ABNORMAL LOW
Glucose, Bld: 98 mg/dL (ref 70–99)
Potassium: 4.1 mEq/L (ref 3.5–5.3)
Sodium: 139 mEq/L (ref 135–145)

## 2013-06-21 LAB — LIPID PANEL
Cholesterol: 201 mg/dL — ABNORMAL HIGH (ref 0–200)
HDL: 53 mg/dL (ref >39)
LDL Cholesterol: 120 mg/dL — ABNORMAL HIGH (ref 0–99)
Total CHOL/HDL Ratio: 3.8 Ratio
Triglycerides: 142 mg/dL (ref <150)
VLDL: 28 mg/dL (ref 0–40)

## 2013-06-21 LAB — HEPATIC FUNCTION PANEL
ALT: 17 U/L (ref 0–35)
AST: 21 U/L (ref 0–37)
Albumin: 5 g/dL (ref 3.5–5.2)
Alkaline Phosphatase: 63 U/L (ref 39–117)
Bilirubin, Direct: 0.2 mg/dL (ref 0.0–0.3)
Indirect Bilirubin: 1 mg/dL (ref 0.2–1.2)
Total Bilirubin: 1.2 mg/dL (ref 0.2–1.2)
Total Protein: 7.2 g/dL (ref 6.0–8.3)

## 2013-06-21 LAB — VITAMIN D 25 HYDROXY (VIT D DEFICIENCY, FRACTURES): Vit D, 25-Hydroxy: 74 ng/mL (ref 30–89)

## 2013-06-21 LAB — INSULIN, FASTING: Insulin fasting, serum: 24 u[IU]/mL (ref 3–28)

## 2013-06-21 LAB — MAGNESIUM: Magnesium: 2.2 mg/dL (ref 1.5–2.5)

## 2013-06-21 LAB — TSH: TSH: 2.718 u[IU]/mL (ref 0.350–4.500)

## 2013-09-20 ENCOUNTER — Encounter: Payer: Self-pay | Admitting: Emergency Medicine

## 2013-09-20 ENCOUNTER — Ambulatory Visit (INDEPENDENT_AMBULATORY_CARE_PROVIDER_SITE_OTHER): Payer: Medicare Other | Admitting: Emergency Medicine

## 2013-09-20 VITALS — BP 118/70 | HR 72 | Temp 98.0°F | Resp 16 | Ht 60.5 in | Wt 155.0 lb

## 2013-09-20 DIAGNOSIS — R209 Unspecified disturbances of skin sensation: Secondary | ICD-10-CM

## 2013-09-20 DIAGNOSIS — R7309 Other abnormal glucose: Secondary | ICD-10-CM

## 2013-09-20 DIAGNOSIS — E782 Mixed hyperlipidemia: Secondary | ICD-10-CM

## 2013-09-20 DIAGNOSIS — E538 Deficiency of other specified B group vitamins: Secondary | ICD-10-CM

## 2013-09-20 DIAGNOSIS — R202 Paresthesia of skin: Secondary | ICD-10-CM

## 2013-09-20 DIAGNOSIS — D649 Anemia, unspecified: Secondary | ICD-10-CM

## 2013-09-20 DIAGNOSIS — I10 Essential (primary) hypertension: Secondary | ICD-10-CM

## 2013-09-20 LAB — CBC WITH DIFFERENTIAL/PLATELET
Basophils Absolute: 0 10*3/uL (ref 0.0–0.1)
Basophils Relative: 0 % (ref 0–1)
Eosinophils Absolute: 0.1 10*3/uL (ref 0.0–0.7)
Eosinophils Relative: 3 % (ref 0–5)
HCT: 40.8 % (ref 36.0–46.0)
Hemoglobin: 14.1 g/dL (ref 12.0–15.0)
Lymphocytes Relative: 43 % (ref 12–46)
Lymphs Abs: 2.1 10*3/uL (ref 0.7–4.0)
MCH: 29.9 pg (ref 26.0–34.0)
MCHC: 34.6 g/dL (ref 30.0–36.0)
MCV: 86.4 fL (ref 78.0–100.0)
Monocytes Absolute: 0.4 10*3/uL (ref 0.1–1.0)
Monocytes Relative: 9 % (ref 3–12)
Neutro Abs: 2.2 10*3/uL (ref 1.7–7.7)
Neutrophils Relative %: 45 % (ref 43–77)
Platelets: 149 10*3/uL — ABNORMAL LOW (ref 150–400)
RBC: 4.72 MIL/uL (ref 3.87–5.11)
RDW: 13.8 % (ref 11.5–15.5)
WBC: 4.9 10*3/uL (ref 4.0–10.5)

## 2013-09-20 LAB — HEMOGLOBIN A1C
Hgb A1c MFr Bld: 5.8 % — ABNORMAL HIGH (ref <5.7)
Mean Plasma Glucose: 120 mg/dL — ABNORMAL HIGH (ref <117)

## 2013-09-20 NOTE — Progress Notes (Signed)
Subjective:    Patient ID: Jacqueline Hancock, female    DOB: 1941-02-24, 73 y.o.   MRN: [id number]  HPI Comments: 73 yo AAF presents for 3 month F/U for HTN, Cholesterol, Pre-Dm, D. Deficient. She is not exercising as much with hot weather. She is eating healthy for the most part. She notes BP good. She has not ever been on statin.   She notes right arm occasional crawling sensation. She denies recent injury or strain to neck/arm.   WBC             4.6   06/20/2013 HGB            15.1   06/20/2013 HCT            43.2   06/20/2013 PLT             202   06/20/2013 GLUCOSE          98   06/20/2013 CHOL            201   06/20/2013 TRIG            142   06/20/2013 HDL              53   06/20/2013 LDLCALC         120   06/20/2013 ALT              17   06/20/2013 AST              21   06/20/2013 NA              139   06/20/2013 K               4.1   06/20/2013 CL               99   06/20/2013 CREATININE     0.98   06/20/2013 BUN              14   06/20/2013 CO2              30   06/20/2013 TSH           2.718   06/20/2013 HGBA1C          5.7   06/20/2013 MICROALBUR     0.50   02/25/2013   Hypertension      Medication List       This list is accurate as of: 09/20/13 11:18 AM.  Always use your most recent med list.               amLODipine 5 MG tablet  Commonly known as:  NORVASC  take 1 tablet by mouth once daily     aspirin 81 MG tablet  Take 81 mg by mouth daily.     Fish Oil 1000 MG Caps  Take by mouth 2 (two) times daily.     vitamin C 1000 MG tablet  Take 1,000 mg by mouth daily.     Vitamin D 2000 UNITS tablet  Take 2,000 Units by mouth 2 (two) times daily.     vitamin E 200 UNIT capsule  Take 200 Units by mouth daily.       Allergies  Allergen Reactions  . Nexium [Esomeprazole Magnesium]     Headache  . Ranitidine     myalgias   Past Medical History  Diagnosis Date  . Hypertension   . Hyperlipidemia   . Elevated hemoglobin  A1c   . GERD (gastroesophageal reflux  disease)   . Osteopenia   . Fibroids   . Early cataracts, bilateral      Review of Systems  Neurological: Positive for numbness.  All other systems reviewed and are negative.  BP 118/70  Pulse 72  Temp(Src) 98 F (36.7 C) (Temporal)  Resp 16  Ht 5' 0.5" (1.537 m)  Wt 155 lb (70.308 kg)  BMI 29.76 kg/m2     Objective:   Physical Exam  Nursing note and vitals reviewed. Constitutional: She is oriented to person, place, and time. She appears well-developed and well-nourished. No distress.  HENT:  Head: Normocephalic and atraumatic.  Right Ear: External ear normal.  Left Ear: External ear normal.  Nose: Nose normal.  Mouth/Throat: Oropharynx is clear and moist.  Eyes: Conjunctivae and EOM are normal.  Neck: Normal range of motion. Neck supple. No JVD present. No thyromegaly present.  Cardiovascular: Normal rate, regular rhythm, normal heart sounds and intact distal pulses.   Pulmonary/Chest: Effort normal and breath sounds normal.  Abdominal: Soft. Bowel sounds are normal. She exhibits no distension and no mass. There is no tenderness. There is no rebound and no guarding.  Musculoskeletal: Normal range of motion. She exhibits no edema and no tenderness.  Lymphadenopathy:    She has no cervical adenopathy.  Neurological: She is alert and oriented to person, place, and time. No cranial nerve deficit.  Skin: Skin is warm and dry. No rash noted. No erythema. No pallor.  Psychiatric: She has a normal mood and affect. Her behavior is normal. Judgment and thought content normal.          Assessment & Plan:  1.  3 month F/U for HTN, Cholesterol, Pre-Dm, D. Deficient. Needs healthy diet, cardio QD and obtain healthy weight. Check Labs, Check BP if >130/80 call office   2. ? Anemia vs Degenerative changes with Arm tingling- Check labs, if NEG get neck / shoulder Right xray, patient w/c

## 2013-09-20 NOTE — Patient Instructions (Signed)
FYI Degenerative Disk Disease Degenerative disk disease is a condition caused by the changes that occur in the cushions of the backbone (spinal disks) as you grow older. Spinal disks are soft and compressible disks located between the bones of the spine (vertebrae). They act like shock absorbers. Degenerative disk disease can affect the whole spine. However, the neck and lower back are most commonly affected. Many changes can occur in the spinal disks with aging, such as:  The spinal disks may dry and shrink.  Small tears may occur in the tough, outer covering of the disk (annulus).  The disk space may become smaller due to loss of water.  Abnormal growths in the bone (spurs) may occur. This can put pressure on the nerve roots exiting the spinal canal, causing pain.  The spinal canal may become narrowed. CAUSES  Degenerative disk disease is a condition caused by the changes that occur in the spinal disks with aging. The exact cause is not known, but there is a genetic basis for many patients. Degenerative changes can occur due to loss of fluid in the disk. This makes the disk thinner and reduces the space between the backbones. Small cracks can develop in the outer layer of the disk. This can lead to the breakdown of the disk. You are more likely to get degenerative disk disease if you are overweight. Smoking cigarettes and doing heavy work such as weightlifting can also increase your risk of this condition. Degenerative changes can start after a sudden injury. Growth of bone spurs can compress the nerve roots and cause pain.  SYMPTOMS  The symptoms vary from person to person. Some people may have no pain, while others have severe pain. The pain may be so severe that it can limit your activities. The location of the pain depends on the part of your backbone that is affected. You will have neck or arm pain if a disk in the neck area is affected. You will have pain in your back, buttocks, or legs if a  disk in the lower back is affected. The pain becomes worse while bending, reaching up, or with twisting movements. The pain may start gradually and then get worse as time passes. It may also start after a major or minor injury. You may feel numbness or tingling in the arms or legs.  DIAGNOSIS  Your caregiver will ask you about your symptoms and about activities or habits that may cause the pain. He or she may also ask about any injuries, diseases, or treatments you have had earlier. Your caregiver will examine you to check for the range of movement that is possible in the affected area, to check for strength in your extremities, and to check for sensation in the areas of the arms and legs supplied by different nerve roots. An X-ray of the spine may be taken. Your caregiver may suggest other imaging tests, such as magnetic resonance imaging (MRI), if needed.  TREATMENT  Treatment includes rest, modifying your activities, and applying ice and heat. Your caregiver may prescribe medicines to reduce your pain and may ask you to do some exercises to strengthen your back. In some cases, you may need surgery. You and your caregiver will decide on the treatment that is best for you. HOME CARE INSTRUCTIONS   Follow proper lifting and walking techniques as advised by your caregiver.  Maintain good posture.  Exercise regularly as advised.  Perform relaxation exercises.  Change your sitting, standing, and sleeping habits as advised. Change  positions frequently.  Lose weight as advised.  Stop smoking if you smoke.  Wear supportive footwear. SEEK MEDICAL CARE IF:  Your pain does not go away within 1 to 4 weeks. SEEK IMMEDIATE MEDICAL CARE IF:   Your pain is severe.  You notice weakness in your arms, hands, or legs.  You begin to lose control of your bladder or bowel movements. MAKE SURE YOU:   Understand these instructions.  Will watch your condition.  Will get help right away if you are not  doing well or get worse. Document Released: 01/16/2007 Document Revised: 06/13/2011 Document Reviewed: 01/16/2007 [facility] Patient Information 2015 [place], [place]. This information is not intended to replace advice given to you by your health care provider. Make sure you discuss any questions you have with your health care provider.

## 2013-09-21 LAB — IRON AND TIBC
%SAT: 25 % (ref 20–55)
Iron: 80 ug/dL (ref 42–145)
TIBC: 325 ug/dL (ref 250–470)
UIBC: 245 ug/dL (ref 125–400)

## 2013-09-21 LAB — BASIC METABOLIC PANEL WITH GFR
BUN: 16 mg/dL (ref 6–23)
CO2: 24 mEq/L (ref 19–32)
Calcium: 9.6 mg/dL (ref 8.4–10.5)
Chloride: 104 mEq/L (ref 96–112)
Creat: 1.02 mg/dL (ref 0.50–1.10)
GFR, Est African American: 63 mL/min
GFR, Est Non African American: 55 mL/min — ABNORMAL LOW
Glucose, Bld: 89 mg/dL (ref 70–99)
Potassium: 4.2 mEq/L (ref 3.5–5.3)
Sodium: 141 mEq/L (ref 135–145)

## 2013-09-21 LAB — LIPID PANEL
Cholesterol: 175 mg/dL (ref 0–200)
HDL: 48 mg/dL (ref >39)
LDL Cholesterol: 104 mg/dL — ABNORMAL HIGH (ref 0–99)
Total CHOL/HDL Ratio: 3.6 Ratio
Triglycerides: 115 mg/dL (ref <150)
VLDL: 23 mg/dL (ref 0–40)

## 2013-09-21 LAB — HEPATIC FUNCTION PANEL
ALT: 14 U/L (ref 0–35)
AST: 19 U/L (ref 0–37)
Albumin: 4.3 g/dL (ref 3.5–5.2)
Alkaline Phosphatase: 55 U/L (ref 39–117)
Bilirubin, Direct: 0.2 mg/dL (ref 0.0–0.3)
Indirect Bilirubin: 0.7 mg/dL (ref 0.2–1.2)
Total Bilirubin: 0.9 mg/dL (ref 0.2–1.2)
Total Protein: 6.5 g/dL (ref 6.0–8.3)

## 2013-09-21 LAB — FOLATE RBC: RBC Folate: 212 ng/mL — ABNORMAL LOW (ref >280)

## 2013-09-21 LAB — INSULIN, FASTING: Insulin fasting, serum: 23 u[IU]/mL (ref 3–28)

## 2013-09-21 LAB — VITAMIN B12: Vitamin B-12: 357 pg/mL (ref 211–911)

## 2013-09-21 LAB — TSH: TSH: 1.425 u[IU]/mL (ref 0.350–4.500)

## 2013-09-23 ENCOUNTER — Other Ambulatory Visit: Payer: Self-pay | Admitting: *Deleted

## 2013-09-23 MED ORDER — AMLODIPINE BESYLATE 5 MG PO TABS: ORAL_TABLET | ORAL | Status: DC

## 2013-09-25 ENCOUNTER — Ambulatory Visit: Payer: Self-pay | Admitting: Emergency Medicine

## 2013-11-11 ENCOUNTER — Emergency Department (HOSPITAL_COMMUNITY): Payer: Medicare Other

## 2013-11-11 ENCOUNTER — Encounter (HOSPITAL_COMMUNITY): Payer: Self-pay | Admitting: Emergency Medicine

## 2013-11-11 ENCOUNTER — Emergency Department (HOSPITAL_COMMUNITY)
Admission: EM | Admit: 2013-11-11 | Discharge: 2013-11-11 | Disposition: A | Discharge status: Nursing Home (Includes ICF) | Payer: Medicare Other | Source: Home / Self Care | Attending: Family Medicine | Admitting: Family Medicine

## 2013-11-11 ENCOUNTER — Encounter (HOSPITAL_COMMUNITY): Payer: Self-pay | Admitting: Family Medicine

## 2013-11-11 ENCOUNTER — Emergency Department (HOSPITAL_COMMUNITY)
Admission: EM | Admit: 2013-11-11 | Discharge: 2013-11-11 | Disposition: A | Discharge status: Home / Self Care | Payer: Medicare Other | Attending: Emergency Medicine | Admitting: Emergency Medicine

## 2013-11-11 DIAGNOSIS — H1132 Conjunctival hemorrhage, left eye: Secondary | ICD-10-CM

## 2013-11-11 DIAGNOSIS — H1130 Conjunctival hemorrhage, unspecified eye: Secondary | ICD-10-CM | POA: Insufficient documentation

## 2013-11-11 DIAGNOSIS — S0502XA Injury of conjunctiva and corneal abrasion without foreign body, left eye, initial encounter: Secondary | ICD-10-CM

## 2013-11-11 DIAGNOSIS — S0510XA Contusion of eyeball and orbital tissues, unspecified eye, initial encounter: Secondary | ICD-10-CM | POA: Insufficient documentation

## 2013-11-11 DIAGNOSIS — Z7982 Long term (current) use of aspirin: Secondary | ICD-10-CM | POA: Diagnosis not present

## 2013-11-11 DIAGNOSIS — I10 Essential (primary) hypertension: Secondary | ICD-10-CM | POA: Diagnosis not present

## 2013-11-11 DIAGNOSIS — Z862 Personal history of diseases of the blood and blood-forming organs and certain disorders involving the immune mechanism: Secondary | ICD-10-CM | POA: Diagnosis not present

## 2013-11-11 DIAGNOSIS — S058X9A Other injuries of unspecified eye and orbit, initial encounter: Secondary | ICD-10-CM | POA: Diagnosis not present

## 2013-11-11 DIAGNOSIS — IMO0002 Reserved for concepts with insufficient information to code with codable children: Secondary | ICD-10-CM

## 2013-11-11 DIAGNOSIS — Z8542 Personal history of malignant neoplasm of other parts of uterus: Secondary | ICD-10-CM | POA: Diagnosis not present

## 2013-11-11 DIAGNOSIS — T148XXA Other injury of unspecified body region, initial encounter: Secondary | ICD-10-CM

## 2013-11-11 DIAGNOSIS — Z8719 Personal history of other diseases of the digestive system: Secondary | ICD-10-CM | POA: Insufficient documentation

## 2013-11-11 DIAGNOSIS — M949 Disorder of cartilage, unspecified: Secondary | ICD-10-CM

## 2013-11-11 DIAGNOSIS — Z79899 Other long term (current) drug therapy: Secondary | ICD-10-CM | POA: Diagnosis not present

## 2013-11-11 DIAGNOSIS — Z90710 Acquired absence of both cervix and uterus: Secondary | ICD-10-CM | POA: Diagnosis not present

## 2013-11-11 DIAGNOSIS — M899 Disorder of bone, unspecified: Secondary | ICD-10-CM | POA: Diagnosis not present

## 2013-11-11 DIAGNOSIS — Z8639 Personal history of other endocrine, nutritional and metabolic disease: Secondary | ICD-10-CM | POA: Insufficient documentation

## 2013-11-11 DIAGNOSIS — S0512XA Contusion of eyeball and orbital tissues, left eye, initial encounter: Secondary | ICD-10-CM

## 2013-11-11 MED ORDER — FLUORESCEIN SODIUM 1 MG OP STRP
1.0000 | ORAL_STRIP | Freq: Once | OPHTHALMIC | Status: AC
  Administered 2013-11-11: 1 via OPHTHALMIC
  Filled 2013-11-11: qty 1

## 2013-11-11 MED ORDER — CIPROFLOXACIN HCL 0.3 % OP SOLN
2.0000 [drp] | OPHTHALMIC | Status: DC
Start: 2013-11-11 — End: 2014-08-18

## 2013-11-11 MED ORDER — TETRACAINE HCL 0.5 % OP SOLN
1.0000 [drp] | Freq: Once | OPHTHALMIC | Status: AC
  Administered 2013-11-11: 1 [drp] via OPHTHALMIC
  Filled 2013-11-11: qty 2

## 2013-11-11 NOTE — ED Notes (Signed)
Patient placed in room. 

## 2013-11-11 NOTE — ED Notes (Signed)
Eye equipment placed at the bedside for PA Exam. PA made aware.

## 2013-11-11 NOTE — Discharge Instructions (Signed)
Please follow the instructions below. Please follow-up with your primary care provider form your visit today.  Don't hesitate to return for any concerns regarding your vision, or new/ worsening pain/    You have a scratch of the eye on the cornea (the clear part of the eye). This condition may be caused by trauma. It is a common problem for people who wear contact lenses. Proper treatment is important. No evidence of infection is noted today, but you could develop an infection called a corneal ulcer or have some retained foreign body that may or may not have been noted today in the ED. Ulcers are not only painful, but they may also scar the cornea and cause permanent damage to the eye.  If you wear contact lenses, do not use them until your eye caregiver approves. Follow-up care is necessary to be sure the corneal abrasion is healing if not completely resolved in 2-3 days. See your caregiver or eye specialist as suggested for followup. RETURN IMMEDIATELY IF YOU DEVELOP severe pain, pus drainage, new change in vision, fever, or other concerns.
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
## 2013-11-11 NOTE — ED Notes (Signed)
PA and NP at the bedside.
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
## 2013-11-12 NOTE — ED Provider Notes (Signed)
Medical screening examination/treatment/procedure(s) were performed by non-physician practitioner and as supervising physician I was immediately available for consultation/collaboration.   EKG Interpretation None       Threasa Beards, MD 11/12/13 [phone]

## 2013-11-28 ENCOUNTER — Other Ambulatory Visit: Payer: Self-pay | Admitting: Obstetrics and Gynecology

## 2013-11-28 DIAGNOSIS — Z1231 Encounter for screening mammogram for malignant neoplasm of breast: Secondary | ICD-10-CM

## 2013-12-04 ENCOUNTER — Telehealth: Payer: Self-pay | Admitting: Internal Medicine

## 2013-12-23 ENCOUNTER — Ambulatory Visit: Payer: Self-pay | Admitting: Physician Assistant

## 2013-12-31 ENCOUNTER — Ambulatory Visit (HOSPITAL_COMMUNITY)
Admission: RE | Admit: 2013-12-31 | Discharge: 2013-12-31 | Disposition: A | Discharge status: Home / Self Care | Payer: Medicare Other | Source: Ambulatory Visit | Attending: Obstetrics and Gynecology | Admitting: Obstetrics and Gynecology

## 2013-12-31 DIAGNOSIS — Z1231 Encounter for screening mammogram for malignant neoplasm of breast: Secondary | ICD-10-CM | POA: Insufficient documentation

## 2014-01-21 ENCOUNTER — Other Ambulatory Visit: Payer: Self-pay | Admitting: Emergency Medicine

## 2014-01-21 MED ORDER — AMLODIPINE BESYLATE 5 MG PO TABS: 5.0000 mg | ORAL_TABLET | Freq: Every day | ORAL | Status: DC

## 2014-02-26 ENCOUNTER — Encounter: Payer: Self-pay | Admitting: Emergency Medicine

## 2014-03-25 NOTE — Telephone Encounter (Signed)
ERROR

## 2014-03-26 ENCOUNTER — Encounter: Payer: Self-pay | Admitting: Emergency Medicine

## 2014-04-03 ENCOUNTER — Telehealth: Payer: Self-pay | Admitting: Internal Medicine

## 2014-04-03 NOTE — Telephone Encounter (Signed)
Calling to [place] for 05-2014  Thank you, Katrina Judeth Horn [facility] [phone] Fax [phone]

## 2014-05-16 ENCOUNTER — Encounter: Payer: Self-pay | Admitting: Emergency Medicine

## 2014-05-16 ENCOUNTER — Ambulatory Visit (INDEPENDENT_AMBULATORY_CARE_PROVIDER_SITE_OTHER): Payer: Medicare Other | Admitting: Emergency Medicine

## 2014-05-16 VITALS — BP 140/82 | HR 76 | Temp 98.0°F | Resp 16 | Ht 60.5 in | Wt 158.0 lb

## 2014-05-16 DIAGNOSIS — R6889 Other general symptoms and signs: Secondary | ICD-10-CM | POA: Diagnosis not present

## 2014-05-16 DIAGNOSIS — R0789 Other chest pain: Secondary | ICD-10-CM

## 2014-05-16 DIAGNOSIS — E782 Mixed hyperlipidemia: Secondary | ICD-10-CM | POA: Diagnosis not present

## 2014-05-16 DIAGNOSIS — I10 Essential (primary) hypertension: Secondary | ICD-10-CM | POA: Diagnosis not present

## 2014-05-16 DIAGNOSIS — R7309 Other abnormal glucose: Secondary | ICD-10-CM | POA: Diagnosis not present

## 2014-05-16 DIAGNOSIS — Z0000 Encounter for general adult medical examination without abnormal findings: Secondary | ICD-10-CM

## 2014-05-16 DIAGNOSIS — R739 Hyperglycemia, unspecified: Secondary | ICD-10-CM | POA: Diagnosis not present

## 2014-05-16 DIAGNOSIS — H65193 Other acute nonsuppurative otitis media, bilateral: Secondary | ICD-10-CM

## 2014-05-16 DIAGNOSIS — Z1212 Encounter for screening for malignant neoplasm of rectum: Secondary | ICD-10-CM

## 2014-05-16 DIAGNOSIS — R5383 Other fatigue: Secondary | ICD-10-CM

## 2014-05-16 DIAGNOSIS — R5381 Other malaise: Secondary | ICD-10-CM | POA: Diagnosis not present

## 2014-05-16 DIAGNOSIS — E538 Deficiency of other specified B group vitamins: Secondary | ICD-10-CM | POA: Diagnosis not present

## 2014-05-16 DIAGNOSIS — E559 Vitamin D deficiency, unspecified: Secondary | ICD-10-CM | POA: Diagnosis not present

## 2014-05-16 DIAGNOSIS — Z0001 Encounter for general adult medical examination with abnormal findings: Secondary | ICD-10-CM | POA: Diagnosis not present

## 2014-05-16 MED ORDER — AMOXICILLIN-POT CLAVULANATE 875-125 MG PO TABS: 1.0000 | ORAL_TABLET | Freq: Two times a day (BID) | ORAL | Status: AC

## 2014-05-16 NOTE — Patient Instructions (Signed)

## 2014-05-17 LAB — CBC WITH DIFFERENTIAL/PLATELET

## 2014-05-17 LAB — BASIC METABOLIC PANEL WITH GFR
BUN: 14 mg/dL (ref 6–23)
CO2: 29 mEq/L (ref 19–32)
Calcium: 10.1 mg/dL (ref 8.4–10.5)
Chloride: 101 mEq/L (ref 96–112)
Creat: 1 mg/dL (ref 0.50–1.10)
GFR, Est African American: 65 mL/min
GFR, Est Non African American: 56 mL/min — ABNORMAL LOW
Glucose, Bld: 93 mg/dL (ref 70–99)
Potassium: 4 mEq/L (ref 3.5–5.3)
Sodium: 141 mEq/L (ref 135–145)

## 2014-05-17 LAB — URINALYSIS, ROUTINE W REFLEX MICROSCOPIC
Bilirubin Urine: NEGATIVE
Glucose, UA: NEGATIVE mg/dL
Hgb urine dipstick: NEGATIVE
Ketones, ur: NEGATIVE mg/dL
Leukocytes, UA: NEGATIVE
Nitrite: NEGATIVE
Protein, ur: NEGATIVE mg/dL
Specific Gravity, Urine: <1.005 — ABNORMAL LOW (ref 1.005–1.030)
Urobilinogen, UA: 0.2 mg/dL (ref 0.0–1.0)
pH: 6.5 (ref 5.0–8.0)

## 2014-05-17 LAB — HEPATIC FUNCTION PANEL
ALT: 19 U/L (ref 0–35)
AST: 23 U/L (ref 0–37)
Albumin: 4.8 g/dL (ref 3.5–5.2)
Alkaline Phosphatase: 65 U/L (ref 39–117)
Bilirubin, Direct: 0.2 mg/dL (ref 0.0–0.3)
Indirect Bilirubin: 1.2 mg/dL (ref 0.2–1.2)
Total Bilirubin: 1.4 mg/dL — ABNORMAL HIGH (ref 0.2–1.2)
Total Protein: 7.4 g/dL (ref 6.0–8.3)

## 2014-05-17 LAB — LIPID PANEL
Cholesterol: 211 mg/dL — ABNORMAL HIGH (ref 0–200)
HDL: 54 mg/dL (ref >39)
LDL Cholesterol: 127 mg/dL — ABNORMAL HIGH (ref 0–99)
Total CHOL/HDL Ratio: 3.9 Ratio
Triglycerides: 152 mg/dL — ABNORMAL HIGH (ref <150)
VLDL: 30 mg/dL (ref 0–40)

## 2014-05-17 LAB — MICROALBUMIN / CREATININE URINE RATIO
Creatinine, Urine: 31.8 mg/dL
Microalb Creat Ratio: 6.3 mg/g (ref 0.0–30.0)
Microalb, Ur: 0.2 mg/dL (ref <2.0)

## 2014-05-17 LAB — TSH: TSH: 1.778 u[IU]/mL (ref 0.350–4.500)

## 2014-05-17 LAB — HEMOGLOBIN A1C

## 2014-05-17 LAB — INSULIN, FASTING: Insulin fasting, serum: 13.1 u[IU]/mL (ref 2.0–19.6)

## 2014-05-17 LAB — VITAMIN B12: Vitamin B-12: 588 pg/mL (ref 211–911)

## 2014-05-17 LAB — MAGNESIUM: Magnesium: 2.3 mg/dL (ref 1.5–2.5)

## 2014-05-17 LAB — VITAMIN D 25 HYDROXY (VIT D DEFICIENCY, FRACTURES): Vit D, 25-Hydroxy: 36 ng/mL (ref 30–100)

## 2014-05-18 LAB — FOLATE RBC: RBC Folate: 573 ng/mL (ref >280)
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
## 2014-05-28 ENCOUNTER — Encounter: Payer: Self-pay | Admitting: Cardiology

## 2014-05-28 DIAGNOSIS — I10 Essential (primary) hypertension: Secondary | ICD-10-CM | POA: Diagnosis not present

## 2014-05-28 DIAGNOSIS — K219 Gastro-esophageal reflux disease without esophagitis: Secondary | ICD-10-CM | POA: Diagnosis not present

## 2014-05-28 DIAGNOSIS — R0789 Other chest pain: Secondary | ICD-10-CM | POA: Diagnosis not present

## 2014-05-28 DIAGNOSIS — E668 Other obesity: Secondary | ICD-10-CM | POA: Diagnosis not present

## 2014-05-28 DIAGNOSIS — R079 Chest pain, unspecified: Secondary | ICD-10-CM | POA: Diagnosis not present

## 2014-05-28 DIAGNOSIS — E784 Other hyperlipidemia: Secondary | ICD-10-CM | POA: Diagnosis not present
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
## 2014-07-17 DIAGNOSIS — K219 Gastro-esophageal reflux disease without esophagitis: Secondary | ICD-10-CM | POA: Diagnosis not present

## 2014-07-17 DIAGNOSIS — R0789 Other chest pain: Secondary | ICD-10-CM | POA: Diagnosis not present

## 2014-07-17 DIAGNOSIS — I10 Essential (primary) hypertension: Secondary | ICD-10-CM | POA: Diagnosis not present

## 2014-07-25 ENCOUNTER — Other Ambulatory Visit: Payer: Self-pay | Admitting: *Deleted

## 2014-07-25 MED ORDER — AMLODIPINE BESYLATE 5 MG PO TABS: 5.0000 mg | ORAL_TABLET | Freq: Every day | ORAL | Status: DC

## 2014-08-17 ENCOUNTER — Encounter: Payer: Self-pay | Admitting: Internal Medicine

## 2014-08-17 NOTE — Progress Notes (Signed)
Patient ID: Jacqueline Hancock, female   DOB: June 20, 1940, 74 y.o.   MRN: [id number]  [name]  W

## 2014-08-18 ENCOUNTER — Ambulatory Visit: Payer: Medicare Other | Admitting: Internal Medicine

## 2014-08-18 ENCOUNTER — Ambulatory Visit (INDEPENDENT_AMBULATORY_CARE_PROVIDER_SITE_OTHER): Payer: Medicare Other | Admitting: Internal Medicine

## 2014-08-18 VITALS — BP 118/74 | HR 62 | Temp 98.2°F | Resp 16 | Ht 60.5 in | Wt 158.0 lb

## 2014-08-18 DIAGNOSIS — I10 Essential (primary) hypertension: Secondary | ICD-10-CM | POA: Diagnosis not present

## 2014-08-18 DIAGNOSIS — R7309 Other abnormal glucose: Secondary | ICD-10-CM | POA: Diagnosis not present

## 2014-08-18 DIAGNOSIS — Z79899 Other long term (current) drug therapy: Secondary | ICD-10-CM | POA: Diagnosis not present

## 2014-08-18 DIAGNOSIS — E785 Hyperlipidemia, unspecified: Secondary | ICD-10-CM | POA: Diagnosis not present

## 2014-08-18 DIAGNOSIS — E559 Vitamin D deficiency, unspecified: Secondary | ICD-10-CM

## 2014-08-18 LAB — HEPATIC FUNCTION PANEL
ALT: 20 U/L (ref 0–35)
AST: 20 U/L (ref 0–37)
Albumin: 4.6 g/dL (ref 3.5–5.2)
Alkaline Phosphatase: 63 U/L (ref 39–117)
Bilirubin, Direct: 0.3 mg/dL (ref 0.0–0.3)
Indirect Bilirubin: 0.8 mg/dL (ref 0.2–1.2)
Total Bilirubin: 1.1 mg/dL (ref 0.2–1.2)
Total Protein: 7.1 g/dL (ref 6.0–8.3)

## 2014-08-18 LAB — LIPID PANEL
Cholesterol: 194 mg/dL (ref 0–200)
HDL: 48 mg/dL (ref >=46)
LDL Cholesterol: 117 mg/dL — ABNORMAL HIGH (ref 0–99)
Total CHOL/HDL Ratio: 4 Ratio
Triglycerides: 146 mg/dL (ref <150)
VLDL: 29 mg/dL (ref 0–40)

## 2014-08-18 LAB — BASIC METABOLIC PANEL WITH GFR
BUN: 15 mg/dL (ref 6–23)
CO2: 29 mEq/L (ref 19–32)
Calcium: 10.2 mg/dL (ref 8.4–10.5)
Chloride: 102 mEq/L (ref 96–112)
Creat: 1.04 mg/dL (ref 0.50–1.10)
GFR, Est African American: 61 mL/min
GFR, Est Non African American: 53 mL/min — ABNORMAL LOW
Glucose, Bld: 94 mg/dL (ref 70–99)
Potassium: 4.4 mEq/L (ref 3.5–5.3)
Sodium: 143 mEq/L (ref 135–145)

## 2014-08-18 LAB — CBC WITH DIFFERENTIAL/PLATELET
Basophils Absolute: 0 10*3/uL (ref 0.0–0.1)
Basophils Relative: 0 % (ref 0–1)
Eosinophils Absolute: 0.1 10*3/uL (ref 0.0–0.7)
Eosinophils Relative: 2 % (ref 0–5)
HCT: 42.3 % (ref 36.0–46.0)
Hemoglobin: 14.5 g/dL (ref 12.0–15.0)
Lymphocytes Relative: 45 % (ref 12–46)
Lymphs Abs: 2.1 10*3/uL (ref 0.7–4.0)
MCH: 29.9 pg (ref 26.0–34.0)
MCHC: 34.3 g/dL (ref 30.0–36.0)
MCV: 87.2 fL (ref 78.0–100.0)
MPV: 11.4 fL (ref 8.6–12.4)
Monocytes Absolute: 0.4 10*3/uL (ref 0.1–1.0)
Monocytes Relative: 9 % (ref 3–12)
Neutro Abs: 2.1 10*3/uL (ref 1.7–7.7)
Neutrophils Relative %: 44 % (ref 43–77)
Platelets: 195 10*3/uL (ref 150–400)
RBC: 4.85 MIL/uL (ref 3.87–5.11)
RDW: 14 % (ref 11.5–15.5)
WBC: 4.7 10*3/uL (ref 4.0–10.5)

## 2014-08-18 LAB — MAGNESIUM: Magnesium: 2.3 mg/dL (ref 1.5–2.5)

## 2014-08-18 LAB — HEMOGLOBIN A1C
Hgb A1c MFr Bld: 5.8 % — ABNORMAL HIGH (ref <5.7)
Mean Plasma Glucose: 120 mg/dL — ABNORMAL HIGH (ref <117)

## 2014-08-18 NOTE — Progress Notes (Signed)
Patient ID: Jacqueline Hancock, female   DOB: 10/31/1940, 74 y.o.   MRN: [id number]  Assessment and Plan:  Hypertension:  -Continue medication,  -monitor blood pressure at home.  -Continue DASH diet.   -Reminder to go to the ER if any CP, SOB, nausea, dizziness, severe HA, changes vision/speech, left arm numbness and tingling, and jaw pain.  Cholesterol: -Continue diet and exercise.  -Check cholesterol.   Pre-diabetes: -Continue diet and exercise.  -Check A1C  Vitamin D Def: -check level -continue medications.   Continue diet and meds as discussed. Further disposition pending results of labs.  HPI 74 y.o. female  presents for 3 month follow up with hypertension, hyperlipidemia, prediabetes and vitamin D.   Her blood pressure has been controlled at home, today their BP is BP: 118/74 mmHg.   She does workout.  She reports some mild stretching and doing a little bit of walking.   She denies chest pain, shortness of breath, dizziness.   She is not on cholesterol medication and denies myalgias. Her cholesterol is not at goal. The cholesterol last visit was:   Lab Results  Component Value Date   CHOL 211* 05/16/2014   HDL 54 05/16/2014   LDLCALC 127* 05/16/2014   TRIG 152* 05/16/2014   CHOLHDL 3.9 05/16/2014     She has been working on diet and exercise for prediabetes, and denies foot ulcerations, hyperglycemia, hypoglycemia , increased appetite, nausea, paresthesia of the feet, polydipsia, polyuria, visual disturbances, vomiting and weight loss. Last A1C in the office was:  Lab Results  Component Value Date   HGBA1C CANCELED 05/16/2014    Patient is on Vitamin D supplement.  Lab Results  Component Value Date   VD25OH 36 05/16/2014      Current Medications:  Current Outpatient Prescriptions on File Prior to Visit  Medication Sig Dispense Refill  . amLODipine (NORVASC) 5 MG tablet Take 1 tablet (5 mg total) by mouth daily. 90 tablet 1  . Ascorbic Acid (VITAMIN C) 1000 MG tablet  Take 1,000 mg by mouth daily.    Marland Kitchen aspirin EC 81 MG tablet Take 81 mg by mouth daily.    Marland Kitchen b complex vitamins tablet Take 1 tablet by mouth daily.    . Cholecalciferol (VITAMIN D) 2000 UNITS tablet Take 4,000 Units by mouth daily.     . vitamin E 200 UNIT capsule Take 200 Units by mouth daily.     No current facility-administered medications on file prior to visit.    Medical History:  Past Medical History  Diagnosis Date  . Hypertension   . Hyperlipidemia   . Elevated hemoglobin A1c   . GERD (gastroesophageal reflux disease)   . Osteopenia   . Fibroids   . Early cataracts, bilateral     Allergies:  Allergies  Allergen Reactions  . Nexium [Esomeprazole Magnesium] Other (See Comments)    Headache  . Ranitidine Other (See Comments)    myalgias     Review of Systems:  Review of Systems  Constitutional: Negative for fever, chills and malaise/fatigue.  HENT: Negative for congestion, ear pain, sore throat and tinnitus.   Eyes: Negative.   Respiratory: Negative for cough, shortness of breath and wheezing.   Cardiovascular: Negative for chest pain, palpitations and leg swelling.  Gastrointestinal: Negative for heartburn, nausea, vomiting, diarrhea, constipation, blood in stool and melena.  Genitourinary: Negative.   Skin: Negative.   Neurological: Negative for dizziness, sensory change and headaches.  Psychiatric/Behavioral: Negative for depression. The patient is not  nervous/anxious and does not have insomnia.     Family history- Review and unchanged  Social history- Review and unchanged  Physical Exam: BP 118/74 mmHg  Pulse 62  Temp(Src) 98.2 F (36.8 C) (Temporal)  Resp 16  Ht 5' 0.5" (1.537 m)  Wt 158 lb (71.668 kg)  BMI 30.34 kg/m2 Wt Readings from Last 3 Encounters:  08/18/14 158 lb (71.668 kg)  05/16/14 158 lb (71.668 kg)  09/20/13 155 lb (70.308 kg)    General Appearance: Well nourished well developed, in no apparent distress. Eyes: PERRLA, EOMs,  conjunctiva no swelling or erythema ENT/Mouth: Ear canals normal without obstruction, swelling, erythma, discharge.  TMs normal bilaterally.  Oropharynx moist, clear, without exudate, or postoropharyngeal swelling. Neck: Supple, thyroid normal,no cervical adenopathy  Respiratory: Respiratory effort normal, Breath sounds clear A&P without rhonchi, wheeze, or rale.  No retractions, no accessory usage. Cardio: RRR with no MRGs. Brisk peripheral pulses without edema.  Abdomen: Soft, + BS,  Non tender, no guarding, rebound, hernias, masses. Musculoskeletal: Full ROM, 5/5 strength, Normal gait Skin: Warm, dry without rashes, lesions, ecchymosis.  Neuro: Awake and oriented X 3, Cranial nerves intact. Normal muscle tone, no cerebellar symptoms. Psych: Normal affect, Insight and Judgment appropriate.    FORCUCCI, COURTNEY, PA-C 10:11 AM [facility]

## 2014-08-18 NOTE — Patient Instructions (Signed)
We want weight loss that will last so you should lose 1-2 pounds a week.  THAT IS IT! Please pick THREE things a month to change. Once it is a habit check off the item. Then pick another three items off the list to become habits.  If you are already doing a habit on the list GREAT!  Cross that item off! o Don't drink your calories. Ie, alcohol, soda, fruit juice, and sweet tea.  o Drink more water. Drink a glass when you feel hungry or before each meal.  o Eat breakfast - Complex carb and protein (likeDannon light and fit yogurt, oatmeal, fruit, eggs, turkey bacon). o Measure your cereal.  Eat no more than one cup a day. (ie Kashi) o Eat an apple a day. o Add a vegetable a day. o Try a new vegetable a month. o Use Pam! Stop using oil or butter to cook. o Don't finish your plate or use smaller plates. o Share your dessert. o Eat sugar free Jello for dessert or frozen grapes. o Don't eat 2-3 hours before bed. o Switch to whole wheat bread, pasta, and brown rice. o Make healthier choices when you eat out. No fries! o Pick baked chicken, NOT fried. o Don't forget to SLOW DOWN when you eat. It is not going anywhere.  o Take the stairs. o Park far away in the parking lot o Lift soup cans (or weights) for 10 minutes while watching TV. o Walk at work for 10 minutes during break. o Walk outside 1 time a week with your friend, kids, dog, or significant other. o Start a walking group at church. o Walk the mall as much as you can tolerate.  o Keep a food diary. o Weigh yourself daily. o Walk for 15 minutes 3 days per week. o Cook at home more often and eat out less.  If life happens and you go back to old habits, it is okay.  Just start over. You can do it!   If you experience chest pain, get short of breath, or tired during the exercise, please stop immediately and inform your doctor.   

## 2014-08-19 LAB — INSULIN, RANDOM: Insulin: 19.3 u[IU]/mL (ref 2.0–19.6)

## 2014-08-20 LAB — VITAMIN D 1,25 DIHYDROXY
Vitamin D 1, 25 (OH)2 Total: 70 pg/mL (ref 18–72)
Vitamin D2 1, 25 (OH)2: <8 pg/mL
Vitamin D3 1, 25 (OH)2: 70 pg/mL

## 2014-08-25 ENCOUNTER — Ambulatory Visit: Payer: Self-pay | Admitting: Internal Medicine

## 2014-08-27 ENCOUNTER — Ambulatory Visit: Payer: Self-pay | Admitting: Internal Medicine

## 2015-01-20 ENCOUNTER — Other Ambulatory Visit: Payer: Self-pay | Admitting: Internal Medicine

## 2015-01-23 ENCOUNTER — Other Ambulatory Visit: Payer: Self-pay

## 2015-01-23 DIAGNOSIS — Z1231 Encounter for screening mammogram for malignant neoplasm of breast: Secondary | ICD-10-CM

## 2015-01-28 ENCOUNTER — Encounter: Payer: Self-pay | Admitting: Internal Medicine

## 2015-01-28 ENCOUNTER — Ambulatory Visit (INDEPENDENT_AMBULATORY_CARE_PROVIDER_SITE_OTHER): Payer: Medicare Other | Admitting: Internal Medicine

## 2015-01-28 ENCOUNTER — Other Ambulatory Visit: Payer: Self-pay | Admitting: *Deleted

## 2015-01-28 VITALS — BP 136/82 | HR 76 | Temp 97.7°F | Resp 16 | Ht 60.5 in | Wt 159.8 lb

## 2015-01-28 DIAGNOSIS — E785 Hyperlipidemia, unspecified: Secondary | ICD-10-CM | POA: Diagnosis not present

## 2015-01-28 DIAGNOSIS — E559 Vitamin D deficiency, unspecified: Secondary | ICD-10-CM | POA: Diagnosis not present

## 2015-01-28 DIAGNOSIS — K219 Gastro-esophageal reflux disease without esophagitis: Secondary | ICD-10-CM | POA: Diagnosis not present

## 2015-01-28 DIAGNOSIS — H903 Sensorineural hearing loss, bilateral: Secondary | ICD-10-CM | POA: Diagnosis not present

## 2015-01-28 DIAGNOSIS — Z6830 Body mass index (BMI) 30.0-30.9, adult: Secondary | ICD-10-CM | POA: Diagnosis not present

## 2015-01-28 DIAGNOSIS — Z79899 Other long term (current) drug therapy: Secondary | ICD-10-CM | POA: Diagnosis not present

## 2015-01-28 DIAGNOSIS — I10 Essential (primary) hypertension: Secondary | ICD-10-CM

## 2015-01-28 DIAGNOSIS — R7309 Other abnormal glucose: Secondary | ICD-10-CM

## 2015-01-28 DIAGNOSIS — H68012 Acute Eustachian salpingitis, left ear: Secondary | ICD-10-CM | POA: Diagnosis not present

## 2015-01-28 DIAGNOSIS — H9012 Conductive hearing loss, unilateral, left ear, with unrestricted hearing on the contralateral side: Secondary | ICD-10-CM | POA: Diagnosis not present

## 2015-01-28 DIAGNOSIS — Z23 Encounter for immunization: Secondary | ICD-10-CM

## 2015-01-28 LAB — CBC WITH DIFFERENTIAL/PLATELET
Basophils Absolute: 0 10*3/uL (ref 0.0–0.1)
Basophils Relative: 0 % (ref 0–1)
Eosinophils Absolute: 0.1 10*3/uL (ref 0.0–0.7)
Eosinophils Relative: 2 % (ref 0–5)
HCT: 41.5 % (ref 36.0–46.0)
Hemoglobin: 14.6 g/dL (ref 12.0–15.0)
Lymphocytes Relative: 49 % — ABNORMAL HIGH (ref 12–46)
Lymphs Abs: 2.7 10*3/uL (ref 0.7–4.0)
MCH: 30.6 pg (ref 26.0–34.0)
MCHC: 35.2 g/dL (ref 30.0–36.0)
MCV: 87 fL (ref 78.0–100.0)
MPV: 11.6 fL (ref 8.6–12.4)
Monocytes Absolute: 0.6 10*3/uL (ref 0.1–1.0)
Monocytes Relative: 11 % (ref 3–12)
Neutro Abs: 2.1 10*3/uL (ref 1.7–7.7)
Neutrophils Relative %: 38 % — ABNORMAL LOW (ref 43–77)
Platelets: 180 10*3/uL (ref 150–400)
RBC: 4.77 MIL/uL (ref 3.87–5.11)
RDW: 13.7 % (ref 11.5–15.5)
WBC: 5.5 10*3/uL (ref 4.0–10.5)

## 2015-01-28 LAB — LIPID PANEL
Cholesterol: 180 mg/dL (ref 125–200)
HDL: 50 mg/dL (ref >=46)
LDL Cholesterol: 96 mg/dL (ref <130)
Total CHOL/HDL Ratio: 3.6 Ratio (ref <=5.0)
Triglycerides: 168 mg/dL — ABNORMAL HIGH (ref <150)
VLDL: 34 mg/dL — ABNORMAL HIGH (ref <30)

## 2015-01-28 LAB — HEPATIC FUNCTION PANEL
ALT: 23 U/L (ref 6–29)
AST: 23 U/L (ref 10–35)
Albumin: 4.9 g/dL (ref 3.6–5.1)
Alkaline Phosphatase: 65 U/L (ref 33–130)
Bilirubin, Direct: 0.2 mg/dL (ref <=0.2)
Indirect Bilirubin: 0.9 mg/dL (ref 0.2–1.2)
Total Bilirubin: 1.1 mg/dL (ref 0.2–1.2)
Total Protein: 7.3 g/dL (ref 6.1–8.1)

## 2015-01-28 LAB — BASIC METABOLIC PANEL WITH GFR
BUN: 18 mg/dL (ref 7–25)
CO2: 28 mmol/L (ref 20–31)
Calcium: 10.2 mg/dL (ref 8.6–10.4)
Chloride: 101 mmol/L (ref 98–110)
Creat: 1.06 mg/dL — ABNORMAL HIGH (ref 0.60–0.93)
GFR, Est African American: 60 mL/min (ref >=60)
GFR, Est Non African American: 52 mL/min — ABNORMAL LOW (ref >=60)
Glucose, Bld: 107 mg/dL — ABNORMAL HIGH (ref 65–99)
Potassium: 4 mmol/L (ref 3.5–5.3)
Sodium: 140 mmol/L (ref 135–146)

## 2015-01-28 LAB — MAGNESIUM: Magnesium: 2.3 mg/dL (ref 1.5–2.5)

## 2015-01-28 MED ORDER — RANITIDINE HCL 300 MG PO TABS: ORAL_TABLET | ORAL | Status: DC

## 2015-01-28 MED ORDER — AMLODIPINE BESYLATE 5 MG PO TABS: 5.0000 mg | ORAL_TABLET | Freq: Every day | ORAL | Status: DC

## 2015-01-28 NOTE — Progress Notes (Signed)
Patient ID: Jacqueline Hancock, female   DOB: May 25, 1940, 74 y.o.   MRN: [id number]     This very nice 74 y.o. MBF presents for  follow up with Hypertension, Hyperlipidemia, Pre-Diabetes and Vitamin D Deficiency. Patient is also c/o some intermittent HB & reflux and admits some dietary indiscretions.     Patient is treated for HTN & BP has been controlled at home. Today's BP: 136/82 mmHg. Patient has had no complaints of any cardiac type chest pain, palpitations, dyspnea/orthopnea/PND, dizziness, claudication, or dependent edema.     Hyperlipidemia is controlled with diet & meds. Patient denies myalgias or other med SE's. Last Lipids were not at goal with Cholesterol 194; HDL 48; LDL 117*; Triglycerides 146 on 08/18/2014.      Also, the patient has history of Morbid Obesity (BMI 30.68) and consequent PreDiabetes and has had no symptoms of reactive hypoglycemia, diabetic polys, paresthesias or visual blurring.  Last A1c was  5.8% on 08/18/2014.      Further, the patient also has history of Vitamin D Deficiency and supplements vitamin D without any suspected side-effects. Last vitamin D was  36 on 05/16/2014.  Medication Sig  . amLODipine  5 MG tablet take 1 tablet by mouth once daily  . VITAMIN C 1000 MG tablet Take 1,000 mg by mouth daily.  Marland Kitchen aspirin EC 81 MG tablet Take 81 mg by mouth daily.  Marland Kitchen b complex vitamins tablet Take 1 tablet by mouth daily.  Marland Kitchen VITAMIN  2000 UNITS tablet Take 4,000 Units by mouth daily.   . vitamin E 200 UNIT capsule Take 200 Units by mouth daily.   No facility-administered medications prior to visit.    Allergies  Allergen Reactions  . Nexium [Esomeprazole Magnesium] Other (See Comments)    Headache  . Ranitidine Other (See Comments)    myalgias   PMHx:   Past Medical History  Diagnosis Date  . Hypertension   . Hyperlipidemia   . Elevated hemoglobin A1c   . GERD (gastroesophageal reflux disease)   . Osteopenia   . Fibroids   . Early cataracts, bilateral     Immunization History  Administered Date(s) Administered  . Influenza, High Dose Seasonal PF 01/28/2015  . Influenza-Unspecified 01/29/2013, 12/27/2013  . PPD Test 02/25/2013  . Pneumococcal Polysaccharide-23 02/25/2013  . Td 09/13/2001, 02/20/2012  . Tdap 02/25/2013  . Zoster 10/23/2006   Past Surgical History  Procedure Laterality Date  . 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 Abdominal hysterectomy    . Appendectomy    . Tonsillectomy and adenoidectomy     FHx:    Reviewed / unchanged  SHx:    Reviewed / unchanged  Systems Review:  Constitutional: Denies fever, chills, wt changes, headaches, insomnia, fatigue, night sweats, change in appetite. Eyes: Denies redness, blurred vision, diplopia, discharge, itchy, watery eyes.  ENT: Denies discharge, congestion, post nasal drip, epistaxis, sore throat, earache, hearing loss, dental pain, tinnitus, vertigo, sinus pain, snoring.  CV: Denies chest pain, palpitations, irregular heartbeat, syncope, dyspnea, diaphoresis, orthopnea, PND, claudication or edema. Respiratory: denies cough, dyspnea, DOE, pleurisy, hoarseness, laryngitis, wheezing.  Gastrointestinal: Denies dysphagia, odynophagia, heartburn, reflux, water brash, abdominal pain or cramps, nausea, vomiting, bloating, diarrhea, constipation, hematemesis, melena, hematochezia  or hemorrhoids. Genitourinary: Denies dysuria, frequency, urgency, nocturia, hesitancy, discharge, hematuria or flank pain. Musculoskeletal: Denies arthralgias, myalgias, stiffness, jt. swelling, pain, limping or strain/sprain.  Skin: Denies pruritus, rash, hives, warts, acne, eczema or change in skin lesion(s). Neuro: No  weakness, tremor, incoordination, spasms, paresthesia or pain. Psychiatric: Denies confusion, memory loss or sensory loss. Endo: Denies change in weight, skin or hair change.  Heme/Lymph: No excessive bleeding, bruising or enlarged lymph nodes.  Physical Exam  BP 136/82 mmHg   Pulse 76  Temp(Src) 97.7 F (36.5 C)  Resp 16  Ht 5' 0.5" (1.537 m)  Wt 159 lb 12.8 oz (72.485 kg)  BMI 30.68 kg/m2  Appears well nourished and in no distress. Eyes: PERRLA, EOMs, conjunctiva no swelling or erythema. Sinuses: No frontal/maxillary tenderness ENT/Mouth: EAC's clear, TM's nl w/o erythema, bulging. Nares clear w/o erythema, swelling, exudates. Oropharynx clear without erythema or exudates. Oral hygiene is good. Tongue normal, non obstructing. Hearing intact.  Neck: Supple. Thyroid nl. Car 2+/2+ without bruits, nodes or JVD. Chest: Respirations nl with BS clear & equal w/o rales, rhonchi, wheezing or stridor.  Cor: Heart sounds normal w/ regular rate and rhythm without sig. murmurs, gallops, clicks, or rubs. Peripheral pulses normal and equal  without edema.  Abdomen: Soft & bowel sounds normal. Non-tender w/o guarding, rebound, hernias, masses, or organomegaly.  Lymphatics: Unremarkable.  Musculoskeletal: Full ROM all peripheral extremities, joint stability, 5/5 strength, and normal gait.  Skin: Warm, dry without exposed rashes, lesions or ecchymosis apparent.  Neuro: Cranial nerves intact, reflexes equal bilaterally. Sensory-motor testing grossly intact by monofilament testing to the toes. Tendon reflexes grossly intact.  Pysch: Alert & oriented x 3.  Insight and judgement nl & appropriate. No ideations.  Assessment and Plan:  1. Essential hypertension  - TSH  2. Hyperlipidemia   3. PreDiabetes  - Hemoglobin A1c - Insulin, random  4. Vitamin D deficiency  - Vit D  25 hydroxy   5. Gastroesophageal reflux disease  - Diet discussed w/written instructions  - ranitidine (ZANTAC) 300 MG tablet; Take 1 to 2 tablets daily for heartburn   Dispense: 180 tablet; Refill: 99  6. Need for prophylactic vaccination and inoculation against influenza  - Flu vaccine HIGH DOSE PF (Fluzone High dose)  7. Medication management  - CBC with Differential/Platelet - BASIC  METABOLIC PANEL WITH GFR - Hepatic function panel - Magnesium - Lipid panel  8. BMI 30.68,  adult   Recommended regular exercise, BP monitoring, weight control, and discussed med and SE's. Recommended labs to assess and monitor clinical status. Further disposition pending results of labs. Over 30 minutes of exam, counseling, chart review was performed

## 2015-01-28 NOTE — Patient Instructions (Addendum)
Food Choices for Gastroesophageal Reflux Disease, Adult When you have gastroesophageal reflux disease (GERD), the foods you eat and your eating habits are very important. Choosing the right foods can help ease the discomfort of GERD. WHAT GENERAL GUIDELINES DO I NEED TO FOLLOW?  Choose fruits, vegetables, whole grains, low-fat dairy products, and low-fat meat, fish, and poultry.  Limit fats such as oils, salad dressings, butter, nuts, and avocado.  Keep a food diary to identify foods that cause symptoms.  Avoid foods that cause reflux. These may be different for different people.  Eat frequent small meals instead of three large meals each day.  Eat your meals slowly, in a relaxed setting.  Limit fried foods.  Cook foods using methods other than frying.  Avoid drinking alcohol.  Avoid drinking large amounts of liquids with your meals.  Avoid bending over or lying down until 2-3 hours after eating. WHAT FOODS ARE NOT RECOMMENDED? The following are some foods and drinks that may worsen your symptoms: Vegetables Tomatoes. Tomato juice. Tomato and spaghetti sauce. Chili peppers. Onion and garlic. Horseradish. Fruits Oranges, grapefruit, and lemon (fruit and juice). Meats High-fat meats, fish, and poultry. This includes hot dogs, ribs, ham, sausage, salami, and bacon. Dairy Whole milk and chocolate milk. Sour cream. Cream. Butter. Ice cream. Cream cheese.  Beverages Coffee and tea, with or without caffeine. Carbonated beverages or energy drinks. Condiments Hot sauce. Barbecue sauce.  Sweets/Desserts Chocolate and cocoa. Donuts. Peppermint and spearmint. Fats and Oils High-fat foods, including [place] fries and potato chips. Other Vinegar. Strong spices, such as black pepper, white pepper, red pepper, cayenne, curry powder, cloves, ginger, and chili powder. The items listed above may not be a complete list of foods and beverages to avoid. Contact your dietitian for more  information.   This information is not intended to replace advice given to you by your health care provider. Make sure you discuss any questions you have with your health care provider.   Document Released: 03/21/2005 Document Revised: 04/11/2014 Document Reviewed: 01/23/2013 Elsevier Interactive Patient Education 2016 [employer].  Gastroesophageal Reflux Disease, Adult Normally, food travels down the esophagus and stays in the stomach to be digested. However, when a person has gastroesophageal reflux disease (GERD), food and stomach acid move back up into the esophagus. When this happens, the esophagus becomes sore and inflamed. Over time, GERD can create small holes (ulcers) in the lining of the esophagus.  CAUSES This condition is caused by a problem with the muscle between the esophagus and the stomach (lower esophageal sphincter, or LES). Normally, the LES muscle closes after food passes through the esophagus to the stomach. When the LES is weakened or abnormal, it does not close properly, and that allows food and stomach acid to go back up into the esophagus. The LES can be weakened by certain dietary substances, medicines, and medical conditions, including:  Tobacco use.  Pregnancy.  Having a hiatal hernia.  Heavy alcohol use.  Certain foods and beverages, such as coffee, chocolate, onions, and peppermint. RISK FACTORS This condition is more likely to develop in:  People who have an increased body weight.  People who have connective tissue disorders.  People who use NSAID medicines. SYMPTOMS Symptoms of this condition include:  Heartburn.  Difficult or painful swallowing.  The feeling of having a lump in the throat.  Abitter taste in the mouth.  Bad breath.  Having a large amount of saliva.  Having an upset or bloated stomach.  Belching.  Chest pain.  Shortness of breath or wheezing.  Ongoing (chronic) cough or a night-time cough.  Wearing away of  tooth enamel.  Weight loss. Different conditions can cause chest pain. Make sure to see your health care provider if you experience chest pain. DIAGNOSIS Your health care provider will take a medical history and perform a physical exam. To determine if you have mild or severe GERD, your health care provider may also monitor how you respond to treatment. You may also have other tests, including:  An endoscopy toexamine your stomach and esophagus with a small camera.  A test thatmeasures the acidity level in your esophagus.  A test thatmeasures how much pressure is on your esophagus.  A barium swallow or modified barium swallow to show the shape, size, and functioning of your esophagus. TREATMENT The goal of treatment is to help relieve your symptoms and to prevent complications. Treatment for this condition may vary depending on how severe your symptoms are. Your health care provider may recommend:  Changes to your diet.  Medicine.  Surgery. HOME CARE INSTRUCTIONS Diet  Follow a diet as recommended by your health care provider. This may involve avoiding foods and drinks such as:  Coffee and tea (with or without caffeine).  Drinks that containalcohol.  Energy drinks and sports drinks.  Carbonated drinks or sodas.  Chocolate and cocoa.  Peppermint and mint flavorings.  Garlic and onions.  Horseradish.  Spicy and acidic foods, including peppers, chili powder, curry powder, vinegar, hot sauces, and barbecue sauce.  Citrus fruit juices and citrus fruits, such as oranges, lemons, and limes.  Tomato-based foods, such as red sauce, chili, salsa, and pizza with red sauce.  Fried and fatty foods, such as donuts, french fries, potato chips, and high-fat dressings.  High-fat meats, such as hot dogs and fatty cuts of red and white meats, such as rib eye steak, sausage, ham, and bacon.  High-fat dairy items, such as whole milk, butter, and cream cheese.  Eat small,  frequent meals instead of large meals.  Avoid drinking large amounts of liquid with your meals.  Avoid eating meals during the 2-3 hours before bedtime.  Avoid lying down right after you eat.  Do not exercise right after you eat. General Instructions  Pay attention to any changes in your symptoms.  Take over-the-counter and prescription medicines only as told by your health care provider. Do not take aspirin, ibuprofen, or other NSAIDs unless your health care provider told you to do so.  Do not use any tobacco products, including cigarettes, chewing tobacco, and e-cigarettes. If you need help quitting, ask your health care provider.  Wear loose-fitting clothing. Do not wear anything tight around your waist that causes pressure on your abdomen.  Raise (elevate) the head of your bed 6 inches (15cm).  Try to reduce your stress, such as with yoga or meditation. If you need help reducing stress, ask your health care provider.  If you are overweight, reduce your weight to an amount that is healthy for you. Ask your health care provider for guidance about a safe weight loss goal.  Keep all follow-up visits as told by your health care provider. This is important. SEEK MEDICAL CARE IF:  You have new symptoms.  You have unexplained weight loss.  You have difficulty swallowing, or it hurts to swallow.  You have wheezing or a persistent cough.  Your symptoms do not improve with treatment.  You have a hoarse voice. SEEK IMMEDIATE MEDICAL CARE IF:  You have pain  in your arms, neck, jaw, teeth, or back.  You feel sweaty, dizzy, or light-headed.  You have chest pain or shortness of breath.  You vomit and your vomit looks like blood or coffee grounds.  You faint.  Your stool is bloody or black.  You cannot swallow, drink, or eat.   This information is not intended to replace advice given to you by your health care provider. Make sure you discuss any questions you have with  your health care provider.   Document Released: 12/29/2004 Document Revised: 12/10/2014 Document Reviewed: 07/16/2014 Elsevier Interactive Patient Education 2016 [employer] Inc[employer]   +++++++++++++++++++++++++++++++++++++++++ Hypertension As your heart beats, it forces blood through your arteries. This force is your blood pressure. If the pressure is too high, it is called hypertension (HTN) or high blood pressure. HTN is dangerous because you may have it and not know it. High blood pressure may mean that your heart has to work harder to pump blood. Your arteries may be narrow or stiff. The extra work puts you at risk for heart disease, stroke, and other problems.  Blood pressure consists of two numbers, a higher number over a lower, 110/72, for example. It is stated as "110 over 72." The ideal is below 120 for the top number (systolic) and under 80 for the bottom (diastolic). Write down your blood pressure today. You should pay close attention to your blood pressure if you have certain conditions such as:  Heart failure.  Prior heart attack.  Diabetes  Chronic kidney disease.  Prior stroke.  Multiple risk factors for heart disease. To see if you have HTN, your blood pressure should be measured while you are seated with your arm held at the level of the heart. It should be measured at least twice. A one-time elevated blood pressure reading (especially in the Emergency Department) does not mean that you need treatment. There may be conditions in which the blood pressure is different between your right and left arms. It is important to see your caregiver soon for a recheck. Most people have essential hypertension which means that there is not a specific cause. This type of high blood pressure may be lowered by changing lifestyle factors such as:  Stress.  Smoking.  Lack of exercise.  Excessive weight.  Drug/tobacco/alcohol use.  Eating less salt. Most people do not have symptoms from  high blood pressure until it has caused damage to the body. Effective treatment can often prevent, delay or reduce that damage. TREATMENT  When a cause has been identified, treatment for high blood pressure is directed at the cause. There are a large number of medications to treat HTN. These fall into several categories, and your caregiver will help you select the medicines that are best for you. Medications may have side effects. You should review side effects with your caregiver. If your blood pressure stays high after you have made lifestyle changes or started on medicines,   Your medication(s) may need to be changed.  Other problems may need to be addressed.  Be certain you understand your prescriptions, and know how and when to take your medicine.  Be sure to follow up with your caregiver within the time frame advised (usually within two weeks) to have your blood pressure rechecked and to review your medications.  If you are taking more than one medicine to lower your blood pressure, make sure you know how and at what times they should be taken. Taking two medicines at the same time can result  in blood pressure that is too low. SEEK IMMEDIATE MEDICAL CARE IF:  You develop a severe headache, blurred or changing vision, or confusion.  You have unusual weakness or numbness, or a faint feeling.  You have severe chest or abdominal pain, vomiting, or breathing problems. MAKE SURE YOU:   Understand these instructions.  Will watch your condition.  Will get help right away if you are not doing well or get worse.  Diabetes and Exercise Exercising regularly is important. It is not just about losing weight. It has many health benefits, such as:  Improving your overall fitness, flexibility, and endurance.  Increasing your bone density.  Helping with weight control.  Decreasing your body fat.  Increasing your muscle strength.  Reducing stress and tension.  Improving your overall  health. People with diabetes who exercise gain additional benefits because exercise:  Reduces appetite.  Improves the body's use of blood sugar (glucose).  Helps lower or control blood glucose.  Decreases blood pressure.  Helps control blood lipids (such as cholesterol and triglycerides).  Improves the body's use of the hormone insulin by:  Increasing the body's insulin sensitivity.  Reducing the body's insulin needs.  Decreases the risk for heart disease because exercising:  Lowers cholesterol and triglycerides levels.  Increases the levels of good cholesterol (such as high-density lipoproteins [HDL]) in the body.  Lowers blood glucose levels. YOUR ACTIVITY PLAN  Choose an activity that you enjoy and set realistic goals. Your health care provider or diabetes educator can help you make an activity plan that works for you. You can break activities into 2 or 3 sessions throughout the day. Doing so is as good as one long session. Exercise ideas include:  Taking the dog for a walk.  Taking the stairs instead of the elevator.  Dancing to your favorite song.  Doing your favorite exercise with a friend. RECOMMENDATIONS FOR EXERCISING WITH TYPE 1 OR TYPE 2 DIABETES   Check your blood glucose before exercising. If blood glucose levels are greater than 240 mg/dL, check for urine ketones. Do not exercise if ketones are present.  Avoid injecting insulin into areas of the body that are going to be exercised. For example, avoid injecting insulin into:  The arms when playing tennis.  The legs when jogging.  Keep a record of:  Food intake before and after you exercise.  Expected peak times of insulin action.  Blood glucose levels before and after you exercise.  The type and amount of exercise you have done.  Review your records with your health care provider. Your health care provider will help you to develop guidelines for adjusting food intake and insulin amounts before and  after exercising.  If you take insulin or oral hypoglycemic agents, watch for signs and symptoms of hypoglycemia. They include:  Dizziness.  Shaking.  Sweating.  Chills.  Confusion.  Drink plenty of water while you exercise to prevent dehydration or heat stroke. Body water is lost during exercise and must be replaced.  Talk to your health care provider before starting an exercise program to make sure it is safe for you. Remember, almost any type of activity is better than none.  Cholesterol Cholesterol is a white, waxy, fat-like protein needed by your body in small amounts. The liver makes all the cholesterol you need. It is carried from the liver by the blood through the blood vessels. Deposits (plaque) may build up on blood vessel walls. This makes the arteries narrower and stiffer. Plaque increases the risk  for heart attack and stroke. You cannot feel your cholesterol level even if it is very high. The only way to know is by a blood test to check your lipid (fats) levels. Once you know your cholesterol levels, you should keep a record of the test results. Work with your caregiver to to keep your levels in the desired range. WHAT THE RESULTS MEAN:  Total cholesterol is a rough measure of all the cholesterol in your blood.  LDL is the so-called bad cholesterol. This is the type that deposits cholesterol in the walls of the arteries. You want this level to be low.  HDL is the good cholesterol because it cleans the arteries and carries the LDL away. You want this level to be high.  Triglycerides are fat that the body can either burn for energy or store. High levels are closely linked to heart disease. DESIRED LEVELS:  Total cholesterol below 200.  LDL below 100 for people at risk, below 70 for very high risk.  HDL above 50 is good, above 60 is best.  Triglycerides below 150. HOW TO LOWER YOUR CHOLESTEROL:  Diet.  Choose fish or white meat chicken and [place], roasted or  baked. Limit fatty cuts of red meat, fried foods, and processed meats, such as sausage and lunch meat.  Eat lots of fresh fruits and vegetables. Choose whole grains, beans, pasta, potatoes and cereals.  Use only small amounts of olive, corn or canola oils. Avoid butter, mayonnaise, shortening or palm kernel oils. Avoid foods with trans-fats.  Use skim/nonfat milk and low-fat/nonfat yogurt and cheeses. Avoid whole milk, cream, ice cream, egg yolks and cheeses. Healthy desserts include angel food cake, ginger snaps, animal crackers, hard candy, popsicles, and low-fat/nonfat frozen yogurt. Avoid pastries, cakes, pies and cookies.  Exercise.  A regular program helps decrease LDL and raises HDL.  Helps with weight control.  Do things that increase your activity level like gardening, walking, or taking the stairs.  Medication.  May be prescribed by your caregiver to help lowering cholesterol and the risk for heart disease.  You may need medicine even if your levels are normal if you have several risk factors. HOME CARE INSTRUCTIONS   Follow your diet and exercise programs as suggested by your caregiver.  Take medications as directed.  Have blood work done when your caregiver feels it is necessary. MAKE SURE YOU:   Understand these instructions.  Will watch your condition.  Will get help right away if you are not doing well or get worse.  Vitamin D Deficiency Vitamin D is an important vitamin that your body needs. Having too little of it in your body is called a deficiency. A very bad deficiency can make your bones soft and can cause a condition called rickets.  Vitamin D is important to your body for different reasons, such as:   It helps your body absorb 2 minerals called calcium and phosphorus.  It helps make your bones healthy.  It may prevent some diseases, such as diabetes and multiple sclerosis.  It helps your muscles and heart. You can get vitamin D in several ways. It  is a natural part of some foods. The vitamin is also added to some dairy products and cereals. Some people take vitamin D supplements. Also, your body makes vitamin D when you are in the sun. It changes the sun's rays into a form of the vitamin that your body can use. CAUSES   Not eating enough foods that contain vitamin D.  Not getting enough sunlight.  Having certain digestive system diseases that make it hard to absorb vitamin D. These diseases include Crohn's disease, chronic pancreatitis, and cystic fibrosis.  Having a surgery in which part of the stomach or small intestine is removed.  Being obese. Fat cells pull vitamin D out of your blood. That means that obese people may not have enough vitamin D left in their blood and in other body tissues.  Having chronic kidney or liver disease. RISK FACTORS Risk factors are things that make you more likely to develop a vitamin D deficiency. They include:  Being older.  Not being able to get outside very much.  Living in a nursing home.  Having had broken bones.  Having weak or thin bones (osteoporosis).  Having a disease or condition that changes how your body absorbs vitamin D.  Having dark skin.  Some medicines such as seizure medicines or steroids.  Being overweight or obese. SYMPTOMS Mild cases of vitamin D deficiency may not have any symptoms. If you have a very bad case, symptoms may include:  Bone pain.  Muscle pain.  Falling often.  Broken bones caused by a minor injury, due to osteoporosis. DIAGNOSIS A blood test is the best way to tell if you have a vitamin D deficiency. TREATMENT Vitamin D deficiency can be treated in different ways. Treatment for vitamin D deficiency depends on what is causing it. Options include:  Taking vitamin D supplements.  Taking a calcium supplement. Your caregiver will suggest what dose is best for you. HOME CARE INSTRUCTIONS  Take any supplements that your caregiver prescribes.  Follow the directions carefully. Take only the suggested amount.  Have your blood tested 2 months after you start taking supplements.  Eat foods that contain vitamin D. Healthy choices include:  Fortified dairy products, cereals, or juices. Fortified means vitamin D has been added to the food. Check the label on the package to be sure.  Fatty fish like salmon or trout.  Eggs.  Oysters.  Do not use a tanning bed.  Keep your weight at a healthy level. Lose weight if you need to.  Keep all follow-up appointments. Your caregiver will need to perform blood tests to make sure your vitamin D deficiency is going away. SEEK MEDICAL CARE IF:  You have any questions about your treatment.  You continue to have symptoms of vitamin D deficiency.  You have nausea or vomiting.  You are constipated.  You feel confused.  You have severe abdominal or back pain. MAKE SURE YOU:  Understand these instructions.  Will watch your condition.  Will get help right away if you are not doing well or get worse.

## 2015-01-29 ENCOUNTER — Ambulatory Visit
Admission: RE | Admit: 2015-01-29 | Discharge: 2015-01-29 | Disposition: A | Discharge status: Home / Self Care | Payer: Medicare Other | Source: Ambulatory Visit

## 2015-01-29 DIAGNOSIS — Z1231 Encounter for screening mammogram for malignant neoplasm of breast: Secondary | ICD-10-CM | POA: Diagnosis not present

## 2015-01-29 LAB — HEMOGLOBIN A1C
Hgb A1c MFr Bld: 5.7 % — ABNORMAL HIGH (ref <5.7)
Mean Plasma Glucose: 117 mg/dL — ABNORMAL HIGH (ref <117)

## 2015-01-29 LAB — VITAMIN D 25 HYDROXY (VIT D DEFICIENCY, FRACTURES): Vit D, 25-Hydroxy: 48 ng/mL (ref 30–100)

## 2015-01-29 LAB — TSH: TSH: 2.064 u[IU]/mL (ref 0.350–4.500)

## 2015-01-29 LAB — INSULIN, RANDOM: Insulin: 39.4 u[IU]/mL — ABNORMAL HIGH (ref 2.0–19.6)

## 2015-05-05 ENCOUNTER — Encounter: Payer: Self-pay | Admitting: Physician Assistant

## 2015-05-05 ENCOUNTER — Ambulatory Visit (INDEPENDENT_AMBULATORY_CARE_PROVIDER_SITE_OTHER): Payer: Medicare Other | Admitting: Physician Assistant

## 2015-05-05 VITALS — BP 140/80 | HR 82 | Temp 97.0°F | Resp 16 | Ht 60.5 in | Wt 151.8 lb

## 2015-05-05 DIAGNOSIS — I10 Essential (primary) hypertension: Secondary | ICD-10-CM | POA: Diagnosis not present

## 2015-05-05 DIAGNOSIS — K219 Gastro-esophageal reflux disease without esophagitis: Secondary | ICD-10-CM

## 2015-05-05 DIAGNOSIS — E785 Hyperlipidemia, unspecified: Secondary | ICD-10-CM | POA: Diagnosis not present

## 2015-05-05 DIAGNOSIS — E559 Vitamin D deficiency, unspecified: Secondary | ICD-10-CM

## 2015-05-05 DIAGNOSIS — Z79899 Other long term (current) drug therapy: Secondary | ICD-10-CM

## 2015-05-05 DIAGNOSIS — Z0001 Encounter for general adult medical examination with abnormal findings: Secondary | ICD-10-CM | POA: Diagnosis not present

## 2015-05-05 DIAGNOSIS — R6889 Other general symptoms and signs: Secondary | ICD-10-CM | POA: Diagnosis not present

## 2015-05-05 DIAGNOSIS — D259 Leiomyoma of uterus, unspecified: Secondary | ICD-10-CM | POA: Diagnosis not present

## 2015-05-05 DIAGNOSIS — R7309 Other abnormal glucose: Secondary | ICD-10-CM | POA: Diagnosis not present

## 2015-05-05 DIAGNOSIS — Z0000 Encounter for general adult medical examination without abnormal findings: Secondary | ICD-10-CM

## 2015-05-05 DIAGNOSIS — M8580 Other specified disorders of bone density and structure, unspecified site: Secondary | ICD-10-CM

## 2015-05-05 LAB — CBC WITH DIFFERENTIAL/PLATELET
Basophils Absolute: 0 10*3/uL (ref 0.0–0.1)
Basophils Relative: 0 % (ref 0–1)
Eosinophils Absolute: 0.1 10*3/uL (ref 0.0–0.7)
Eosinophils Relative: 2 % (ref 0–5)
HCT: 43.1 % (ref 36.0–46.0)
Hemoglobin: 14.7 g/dL (ref 12.0–15.0)
Lymphocytes Relative: 45 % (ref 12–46)
Lymphs Abs: 2 10*3/uL (ref 0.7–4.0)
MCH: 30.1 pg (ref 26.0–34.0)
MCHC: 34.1 g/dL (ref 30.0–36.0)
MCV: 88.3 fL (ref 78.0–100.0)
MPV: 10.8 fL (ref 8.6–12.4)
Monocytes Absolute: 0.4 10*3/uL (ref 0.1–1.0)
Monocytes Relative: 9 % (ref 3–12)
Neutro Abs: 2 10*3/uL (ref 1.7–7.7)
Neutrophils Relative %: 44 % (ref 43–77)
Platelets: 182 10*3/uL (ref 150–400)
RBC: 4.88 MIL/uL (ref 3.87–5.11)
RDW: 13.7 % (ref 11.5–15.5)
WBC: 4.5 10*3/uL (ref 4.0–10.5)

## 2015-05-05 LAB — BASIC METABOLIC PANEL WITH GFR
BUN: 12 mg/dL (ref 7–25)
CO2: 27 mmol/L (ref 20–31)
Calcium: 10.5 mg/dL — ABNORMAL HIGH (ref 8.6–10.4)
Chloride: 103 mmol/L (ref 98–110)
Creat: 1.11 mg/dL — ABNORMAL HIGH (ref 0.60–0.93)
GFR, Est African American: 57 mL/min — ABNORMAL LOW (ref >=60)
GFR, Est Non African American: 49 mL/min — ABNORMAL LOW (ref >=60)
Glucose, Bld: 85 mg/dL (ref 65–99)
Potassium: 4.3 mmol/L (ref 3.5–5.3)
Sodium: 139 mmol/L (ref 135–146)

## 2015-05-05 LAB — LIPID PANEL
Cholesterol: 179 mg/dL (ref 125–200)
HDL: 49 mg/dL (ref >=46)
LDL Cholesterol: 103 mg/dL (ref <130)
Total CHOL/HDL Ratio: 3.7 Ratio (ref <=5.0)
Triglycerides: 133 mg/dL (ref <150)
VLDL: 27 mg/dL (ref <30)

## 2015-05-05 LAB — HEPATIC FUNCTION PANEL
ALT: 17 U/L (ref 6–29)
AST: 22 U/L (ref 10–35)
Albumin: 4.5 g/dL (ref 3.6–5.1)
Alkaline Phosphatase: 67 U/L (ref 33–130)
Bilirubin, Direct: 0.2 mg/dL (ref <=0.2)
Indirect Bilirubin: 0.9 mg/dL (ref 0.2–1.2)
Total Bilirubin: 1.1 mg/dL (ref 0.2–1.2)
Total Protein: 7.2 g/dL (ref 6.1–8.1)

## 2015-05-05 LAB — TSH: TSH: 1.938 u[IU]/mL (ref 0.350–4.500)

## 2015-05-05 LAB — MAGNESIUM: Magnesium: 2 mg/dL (ref 1.5–2.5)

## 2015-05-05 MED ORDER — AMLODIPINE BESYLATE 5 MG PO TABS: 5.0000 mg | ORAL_TABLET | Freq: Every day | ORAL | Status: DC

## 2015-05-05 NOTE — Patient Instructions (Signed)
Preventive Care for Adults A healthy lifestyle and preventive care can promote health and wellness. Preventive health guidelines for women include the following key practices.  A routine yearly physical is a good way to check with your health care provider about your health and preventive screening. It is a chance to share any concerns and updates on your health and to receive a thorough exam.  Visit your dentist for a routine exam and preventive care every 6 months. Brush your teeth twice a day and floss once a day. Good oral hygiene prevents tooth decay and gum disease.  The frequency of eye exams is based on your age, health, family medical history, use of contact lenses, and other factors. Follow your health care provider's recommendations for frequency of eye exams.  Eat a healthy diet. Foods like vegetables, fruits, whole grains, low-fat dairy products, and lean protein foods contain the nutrients you need without too many calories. Decrease your intake of foods high in solid fats, added sugars, and salt. Eat the right amount of calories for you.Get information about a proper diet from your health care provider, if necessary.  Regular physical exercise is one of the most important things you can do for your health. Most adults should get at least 150 minutes of moderate-intensity exercise (any activity that increases your heart rate and causes you to sweat) each week. In addition, most adults need muscle-strengthening exercises on 2 or more days a week.  Maintain a healthy weight. The body mass index (BMI) is a screening tool to identify possible weight problems. It provides an estimate of body fat based on height and weight. Your health care provider can find your BMI and can help you achieve or maintain a healthy weight.For adults 20 years and older:  A BMI below 18.5 is considered underweight.  A BMI of 18.5 to 24.9 is normal.  A BMI of 25 to 29.9 is considered overweight.  A BMI of  30 and above is considered obese.  Maintain normal blood lipids and cholesterol levels by exercising and minimizing your intake of saturated fat. Eat a balanced diet with plenty of fruit and vegetables. If your lipid or cholesterol levels are high, you are over 50, or you are at high risk for heart disease, you may need your cholesterol levels checked more frequently.Ongoing high lipid and cholesterol levels should be treated with medicines if diet and exercise are not working.  If you smoke, find out from your health care provider how to quit. If you do not use tobacco, do not start.  Lung cancer screening is recommended for adults aged 86-80 years who are at high risk for developing lung cancer because of a history of smoking. A yearly low-dose CT scan of the lungs is recommended for people who have at least a 30-pack-year history of smoking and are a current smoker or have quit within the past 15 years. A pack year of smoking is smoking an average of 1 pack of cigarettes a day for 1 year (for example: 1 pack a day for 30 years or 2 packs a day for 15 years). Yearly screening should continue until the smoker has stopped smoking for at least 15 years. Yearly screening should be stopped for people who develop a health problem that would prevent them from having lung cancer treatment.  Avoid use of street drugs. Do not share needles with anyone. Ask for help if you need support or instructions about stopping the use of drugs.  High blood  pressure causes heart disease and increases the risk of stroke.  Ongoing high blood pressure should be treated with medicines if weight loss and exercise do not work.  If you are 55-79 years old, ask your health care provider if you should take aspirin to prevent strokes.  Diabetes screening involves taking a blood sample to check your fasting blood sugar level. This should be done once every 3 years, after age 45, if you are within normal weight and without risk  factors for diabetes. Testing should be considered at a younger age or be carried out more frequently if you are overweight and have at least 1 risk factor for diabetes.  Breast cancer screening is essential preventive care for women. You should practice "breast self-awareness." This means understanding the normal appearance and feel of your breasts and may include breast self-examination. Any changes detected, no matter how small, should be reported to a health care provider. Women in their 20s and 30s should have a clinical breast exam (CBE) by a health care provider as part of a regular health exam every 1 to 3 years. After age 40, women should have a CBE every year. Starting at age 40, women should consider having a mammogram (breast X-ray test) every year. Women who have a family history of breast cancer should talk to their health care provider about genetic screening. Women at a high risk of breast cancer should talk to their health care providers about having an MRI and a mammogram every year.  Breast cancer gene (BRCA)-related cancer risk assessment is recommended for women who have family members with BRCA-related cancers. BRCA-related cancers include breast, ovarian, tubal, and peritoneal cancers. Having family members with these cancers may be associated with an increased risk for harmful changes (mutations) in the breast cancer genes BRCA1 and BRCA2. Results of the assessment will determine the need for genetic counseling and BRCA1 and BRCA2 testing.  Routine pelvic exams to screen for cancer are no longer recommended for nonpregnant women who are considered low risk for cancer of the pelvic organs (ovaries, uterus, and vagina) and who do not have symptoms. Ask your health care provider if a screening pelvic exam is right for you.  If you have had past treatment for cervical cancer or a condition that could lead to cancer, you need Pap tests and screening for cancer for at least 20 years after  your treatment. If Pap tests have been discontinued, your risk factors (such as having a new sexual partner) need to be reassessed to determine if screening should be resumed. Some women have medical problems that increase the chance of getting cervical cancer. In these cases, your health care provider may recommend more frequent screening and Pap tests.    Colorectal cancer can be detected and often prevented. Most routine colorectal cancer screening begins at the age of 50 years and continues through age 75 years. However, your health care provider may recommend screening at an earlier age if you have risk factors for colon cancer. On a yearly basis, your health care provider may provide home test kits to check for hidden blood in the stool. Use of a small camera at the end of a tube, to directly examine the colon (sigmoidoscopy or colonoscopy), can detect the earliest forms of colorectal cancer. Talk to your health care provider about this at age 50, when routine screening begins. Direct exam of the colon should be repeated every 5-10 years through age 75 years, unless early forms of pre-cancerous polyps   or small growths are found.  Osteoporosis is a disease in which the bones lose minerals and strength with aging. This can result in serious bone fractures or breaks. The risk of osteoporosis can be identified using a bone density scan. Women ages 62 years and over and women at risk for fractures or osteoporosis should discuss screening with their health care providers. Ask your health care provider whether you should take a calcium supplement or vitamin D to reduce the rate of osteoporosis.  Menopause can be associated with physical symptoms and risks. Hormone replacement therapy is available to decrease symptoms and risks. You should talk to your health care provider about whether hormone replacement therapy is right for you.  Use sunscreen. Apply sunscreen liberally and repeatedly throughout the day.  You should seek shade when your shadow is shorter than you. Protect yourself by wearing long sleeves, pants, a wide-brimmed hat, and sunglasses year round, whenever you are outdoors.  Once a month, do a whole body skin exam, using a mirror to look at the skin on your back. Tell your health care provider of new moles, moles that have irregular borders, moles that are larger than a pencil eraser, or moles that have changed in shape or color.  Stay current with required vaccines (immunizations).  Influenza vaccine. All adults should be immunized every year.  Tetanus, diphtheria, and acellular pertussis (Td, Tdap) vaccine. Pregnant women should receive 1 dose of Tdap vaccine during each pregnancy. The dose should be obtained regardless of the length of time since the last dose. Immunization is preferred during the 27th-36th week of gestation. An adult who has not previously received Tdap or who does not know her vaccine status should receive 1 dose of Tdap. This initial dose should be followed by tetanus and diphtheria toxoids (Td) booster doses every 10 years. Adults with an unknown or incomplete history of completing a 3-dose immunization series with Td-containing vaccines should begin or complete a primary immunization series including a Tdap dose. Adults should receive a Td booster every 10 years.    Zoster vaccine. One dose is recommended for adults aged 4 years or older unless certain conditions are present.    Pneumococcal 13-valent conjugate (PCV13) vaccine. When indicated, a person who is uncertain of her immunization history and has no record of immunization should receive the PCV13 vaccine. An adult aged 35 years or older who has certain medical conditions and has not been previously immunized should receive 1 dose of PCV13 vaccine. This PCV13 should be followed with a dose of pneumococcal polysaccharide (PPSV23) vaccine. The PPSV23 vaccine dose should be obtained at least 8 weeks after the  dose of PCV13 vaccine. An adult aged 85 years or older who has certain medical conditions and previously received 1 or more doses of PPSV23 vaccine should receive 1 dose of PCV13. The PCV13 vaccine dose should be obtained 1 or more years after the last PPSV23 vaccine dose.    Pneumococcal polysaccharide (PPSV23) vaccine. When PCV13 is also indicated, PCV13 should be obtained first. All adults aged 15 years and older should be immunized. An adult younger than age 38 years who has certain medical conditions should be immunized. Any person who resides in a nursing home or long-term care facility should be immunized. An adult smoker should be immunized. People with an immunocompromised condition and certain other conditions should receive both PCV13 and PPSV23 vaccines. People with human immunodeficiency virus (HIV) infection should be immunized as soon as possible after diagnosis. Immunization during  chemotherapy or radiation therapy should be avoided. Routine use of PPSV23 vaccine is not recommended for American Indians, [place] Natives, or people younger than 65 years unless there are medical conditions that require PPSV23 vaccine. When indicated, people who have unknown immunization and have no record of immunization should receive PPSV23 vaccine. One-time revaccination 5 years after the first dose of PPSV23 is recommended for people aged 19-64 years who have chronic kidney failure, nephrotic syndrome, asplenia, or immunocompromised conditions. People who received 1-2 doses of PPSV23 before age 41 years should receive another dose of PPSV23 vaccine at age 24 years or later if at least 5 years have passed since the previous dose. Doses of PPSV23 are not needed for people immunized with PPSV23 at or after age 33 years.   Preventive Services / Frequency  Ages 3 years and over  Blood pressure check.  Lipid and cholesterol check.  Lung cancer screening. / Every year if you are aged 70-80 years and have a  30-pack-year history of smoking and currently smoke or have quit within the past 15 years. Yearly screening is stopped once you have quit smoking for at least 15 years or develop a health problem that would prevent you from having lung cancer treatment.  Clinical breast exam.** / Every year after age 30 years.  BRCA-related cancer risk assessment.** / For women who have family members with a BRCA-related cancer (breast, ovarian, tubal, or peritoneal cancers).  Mammogram.** / Every year beginning at age 44 years and continuing for as long as you are in good health. Consult with your health care provider.  Pap test.** / Every 3 years starting at age 75 years through age 63 or 28 years with 3 consecutive normal Pap tests. Testing can be stopped between 65 and 70 years with 3 consecutive normal Pap tests and no abnormal Pap or HPV tests in the past 10 years.  Fecal occult blood test (FOBT) of stool. / Every year beginning at age 66 years and continuing until age 39 years. You may not need to do this test if you get a colonoscopy every 10 years.  Flexible sigmoidoscopy or colonoscopy.** / Every 5 years for a flexible sigmoidoscopy or every 10 years for a colonoscopy beginning at age 14 years and continuing until age 52 years.  Hepatitis C blood test.** / For all people born from [birth year] through 1965 and any individual with known risks for hepatitis C.  Osteoporosis screening.** / A one-time screening for women ages 63 years and over and women at risk for fractures or osteoporosis.  Skin self-exam. / Monthly.  Influenza vaccine. / Every year.  Tetanus, diphtheria, and acellular pertussis (Tdap/Td) vaccine.** / 1 dose of Td every 10 years.  Zoster vaccine.** / 1 dose for adults aged 32 years or older.  Pneumococcal 13-valent conjugate (PCV13) vaccine.** / Consult your health care provider.  Pneumococcal polysaccharide (PPSV23) vaccine.** / 1 dose for all adults aged 55 years and older. Screening  for abdominal aortic aneurysm (AAA)  by ultrasound is recommended for people who have history of high blood pressure or who are current or former smokers.   Hiatal Hernia A hiatal hernia occurs when part of your stomach slides above the muscle that separates your abdomen from your chest (diaphragm). You can be born with a hiatal hernia (congenital), or it may develop over time. In almost all cases of hiatal hernia, only the top part of the stomach pushes through.  Many people have a hiatal hernia with no symptoms.  The larger the hernia, the more likely that you will have symptoms. In some cases, a hiatal hernia allows stomach acid to flow back into the tube that carries food from your mouth to your stomach (esophagus). This may cause heartburn symptoms. Severe heartburn symptoms may mean you have developed a condition called gastroesophageal reflux disease (GERD).  CAUSES  Hiatal hernias are caused by a weakness in the opening (hiatus) where your esophagus passes through your diaphragm to attach to the upper part of your stomach. You may be born with a weakness in your hiatus, or a weakness can develop. RISK FACTORS Older age is a major risk factor for a hiatal hernia. Anything that increases pressure on your diaphragm can also increase your risk of a hiatal hernia. This includes:  Pregnancy.  Excess weight.  Frequent constipation. SIGNS AND SYMPTOMS  People with a hiatal hernia often have no symptoms. If symptoms develop, they are almost always caused by GERD. They may include:  Heartburn.  Belching.  Indigestion.  Trouble swallowing.  Coughing or wheezing.  Sore throat.  Hoarseness.  Chest pain. DIAGNOSIS  A hiatal hernia is sometimes found during an exam for another problem. Your health care provider may suspect a hiatal hernia if you have symptoms of GERD. Tests may be done to diagnose GERD. These may include:  X-rays of your stomach or chest.  An upper gastrointestinal  (GI) series. This is an X-ray exam of your GI tract involving the use of a chalky liquid that you swallow. The liquid shows up clearly on the X-ray.  Endoscopy. This is a procedure to look into your stomach using a thin, flexible tube that has a tiny camera and light on the end of it. TREATMENT  If you have no symptoms, you may not need treatment. If you have symptoms, treatment may include:  Dietary and lifestyle changes to help reduce GERD symptoms.  Medicines. These may include:  Over-the-counter antacids.  Medicines that make your stomach empty more quickly.  Medicines that block the production of stomach acid (H2 blockers).  Stronger medicines to reduce stomach acid (proton pump inhibitors).  You may need surgery to repair the hernia if other treatments are not helping. HOME CARE INSTRUCTIONS   Take all medicines as directed by your health care provider.  Quit smoking, if you smoke.  Try to achieve and maintain a healthy body weight.  Eat frequent small meals instead of three large meals a day. This keeps your stomach from getting too full.  Eat slowly.  Do not lie down right after eating.  Do noteat 1-2 hours before bed.   Do not drink beverages with caffeine. These include cola, coffee, cocoa, and tea.  Do not drink alcohol.  Avoid foods that can make symptoms of GERD worse. These may include:  Fatty foods.  Citrus fruits.  Other foods and drinks that contain acid.  Avoid putting pressure on your belly. Anything that puts pressure on your belly increases the amount of acid that may be pushed up into your esophagus.   Avoid bending over, especially after eating.  Raise the head of your bed by putting blocks under the legs. This keeps your head and esophagus higher than your stomach.  Do not wear tight clothing around your chest or stomach.  Try not to strain when having a bowel movement, when urinating, or when lifting heavy objects. SEEK MEDICAL CARE  IF:  Your symptoms are not controlled with medicines or lifestyle changes.  You are having  trouble swallowing.  You have coughing or wheezing that will not go away. SEEK IMMEDIATE MEDICAL CARE IF:  Your pain is getting worse.  Your pain spreads to your arms, neck, jaw, teeth, or back.  You have shortness of breath.  You sweat for no reason.  You feel sick to your stomach (nauseous) or vomit.  You vomit blood.  You have bright red blood in your stools.  You have black, tarry stools.    This information is not intended to replace advice given to you by your health care provider. Make sure you discuss any questions you have with your health care provider.   Document Released: 06/11/2003 Document Revised: 04/11/2014 Document Reviewed: 03/08/2013 [employer] Interactive Patient Education [employer].
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
## 2015-05-06 LAB — VITAMIN D 25 HYDROXY (VIT D DEFICIENCY, FRACTURES): Vit D, 25-Hydroxy: 50 ng/mL (ref 30–100)

## 2015-05-06 LAB — HEMOGLOBIN A1C
Hgb A1c MFr Bld: 5.7 % — ABNORMAL HIGH (ref <5.7)
Mean Plasma Glucose: 117 mg/dL — ABNORMAL HIGH (ref <117)

## 2015-05-20 ENCOUNTER — Encounter: Payer: Self-pay | Admitting: Emergency Medicine

## 2015-06-12 ENCOUNTER — Ambulatory Visit (INDEPENDENT_AMBULATORY_CARE_PROVIDER_SITE_OTHER): Payer: Medicare Other | Admitting: *Deleted

## 2015-06-15 LAB — PTH, INTACT AND CALCIUM
Calcium: 10.3 mg/dL (ref 8.4–10.5)
PTH: 43 pg/mL (ref 14–64)

## 2015-08-07 ENCOUNTER — Encounter: Payer: Self-pay | Admitting: Physician Assistant

## 2015-09-10 ENCOUNTER — Ambulatory Visit (INDEPENDENT_AMBULATORY_CARE_PROVIDER_SITE_OTHER): Payer: Medicare Other | Admitting: Physician Assistant

## 2015-09-10 ENCOUNTER — Encounter: Payer: Self-pay | Admitting: Physician Assistant

## 2015-09-10 VITALS — BP 140/86 | HR 76 | Temp 97.9°F | Resp 14 | Ht 60.5 in | Wt 151.4 lb

## 2015-09-10 DIAGNOSIS — Z136 Encounter for screening for cardiovascular disorders: Secondary | ICD-10-CM

## 2015-09-10 DIAGNOSIS — K219 Gastro-esophageal reflux disease without esophagitis: Secondary | ICD-10-CM

## 2015-09-10 DIAGNOSIS — N183 Chronic kidney disease, stage 3 unspecified: Secondary | ICD-10-CM

## 2015-09-10 DIAGNOSIS — Z0000 Encounter for general adult medical examination without abnormal findings: Secondary | ICD-10-CM

## 2015-09-10 DIAGNOSIS — N1832 Chronic kidney disease, stage 3b: Secondary | ICD-10-CM | POA: Insufficient documentation

## 2015-09-10 DIAGNOSIS — I10 Essential (primary) hypertension: Secondary | ICD-10-CM | POA: Diagnosis not present

## 2015-09-10 DIAGNOSIS — Z79899 Other long term (current) drug therapy: Secondary | ICD-10-CM

## 2015-09-10 DIAGNOSIS — R7309 Other abnormal glucose: Secondary | ICD-10-CM

## 2015-09-10 DIAGNOSIS — D259 Leiomyoma of uterus, unspecified: Secondary | ICD-10-CM

## 2015-09-10 DIAGNOSIS — E559 Vitamin D deficiency, unspecified: Secondary | ICD-10-CM

## 2015-09-10 DIAGNOSIS — M8580 Other specified disorders of bone density and structure, unspecified site: Secondary | ICD-10-CM

## 2015-09-10 DIAGNOSIS — E785 Hyperlipidemia, unspecified: Secondary | ICD-10-CM

## 2015-09-10 DIAGNOSIS — Z0001 Encounter for general adult medical examination with abnormal findings: Secondary | ICD-10-CM

## 2015-09-10 LAB — LIPID PANEL
Cholesterol: 186 mg/dL (ref 125–200)
HDL: 57 mg/dL (ref >=46)
LDL Cholesterol: 100 mg/dL (ref <130)
Total CHOL/HDL Ratio: 3.3 Ratio (ref <=5.0)
Triglycerides: 143 mg/dL (ref <150)
VLDL: 29 mg/dL (ref <30)

## 2015-09-10 LAB — HEPATIC FUNCTION PANEL
ALT: 13 U/L (ref 6–29)
AST: 17 U/L (ref 10–35)
Albumin: 4.6 g/dL (ref 3.6–5.1)
Alkaline Phosphatase: 66 U/L (ref 33–130)
Bilirubin, Direct: 0.2 mg/dL (ref <=0.2)
Indirect Bilirubin: 0.7 mg/dL (ref 0.2–1.2)
Total Bilirubin: 0.9 mg/dL (ref 0.2–1.2)
Total Protein: 7 g/dL (ref 6.1–8.1)

## 2015-09-10 LAB — CBC WITH DIFFERENTIAL/PLATELET
Basophils Absolute: 0 cells/uL (ref 0–200)
Basophils Relative: 0 %
Eosinophils Absolute: 156 cells/uL (ref 15–500)
Eosinophils Relative: 3 %
HCT: 42.3 % (ref 35.0–45.0)
Hemoglobin: 14.5 g/dL (ref 11.7–15.5)
Lymphocytes Relative: 39 %
Lymphs Abs: 2028 cells/uL (ref 850–3900)
MCH: 30.9 pg (ref 27.0–33.0)
MCHC: 34.3 g/dL (ref 32.0–36.0)
MCV: 90 fL (ref 80.0–100.0)
MPV: 11.2 fL (ref 7.5–12.5)
Monocytes Absolute: 572 cells/uL (ref 200–950)
Monocytes Relative: 11 %
Neutro Abs: 2444 cells/uL (ref 1500–7800)
Neutrophils Relative %: 47 %
Platelets: 183 10*3/uL (ref 140–400)
RBC: 4.7 MIL/uL (ref 3.80–5.10)
RDW: 13.5 % (ref 11.0–15.0)
WBC: 5.2 10*3/uL (ref 3.8–10.8)

## 2015-09-10 LAB — HEMOGLOBIN A1C
Hgb A1c MFr Bld: 5.5 % (ref <5.7)
Mean Plasma Glucose: 111 mg/dL

## 2015-09-10 LAB — BASIC METABOLIC PANEL WITH GFR
BUN: 14 mg/dL (ref 7–25)
CO2: 27 mmol/L (ref 20–31)
Calcium: 9.8 mg/dL (ref 8.6–10.4)
Chloride: 104 mmol/L (ref 98–110)
Creat: 1.14 mg/dL — ABNORMAL HIGH (ref 0.60–0.93)
GFR, Est African American: 54 mL/min — ABNORMAL LOW (ref >=60)
GFR, Est Non African American: 47 mL/min — ABNORMAL LOW (ref >=60)
Glucose, Bld: 87 mg/dL (ref 65–99)
Potassium: 4.5 mmol/L (ref 3.5–5.3)
Sodium: 139 mmol/L (ref 135–146)

## 2015-09-10 LAB — TSH: TSH: 2.17 mIU/L

## 2015-09-10 LAB — MAGNESIUM: Magnesium: 2.2 mg/dL (ref 1.5–2.5)

## 2015-09-10 NOTE — Patient Instructions (Signed)
Monitor your blood pressure at home. Go to the ER if any CP, SOB, nausea, dizziness, severe HA, changes vision/speech  Goal BP:  For patients younger than 60: Goal BP < 140/90. For patients 60 and older: Goal BP < 150/90. For patients with diabetes: Goal BP < 140/90. Your most recent BP: BP: 140/86 mmHg   Take your medications faithfully as instructed. Maintain a healthy weight. Get at least 150 minutes of aerobic exercise per week. Minimize salt intake. Minimize alcohol intake  DASH Eating Plan DASH stands for "Dietary Approaches to Stop Hypertension." The DASH eating plan is a healthy eating plan that has been shown to reduce high blood pressure (hypertension). Additional health benefits may include reducing the risk of type 2 diabetes mellitus, heart disease, and stroke. The DASH eating plan may also help with weight loss. WHAT DO I NEED TO KNOW ABOUT THE DASH EATING PLAN? For the DASH eating plan, you will follow these general guidelines:  Choose foods with a percent daily value for sodium of less than 5% (as listed on the food label).  Use salt-free seasonings or herbs instead of table salt or sea salt.  Check with your health care provider or pharmacist before using salt substitutes.  Eat lower-sodium products, often labeled as "lower sodium" or "no salt added."  Eat fresh foods.  Eat more vegetables, fruits, and low-fat dairy products.  Choose whole grains. Look for the word "whole" as the first word in the ingredient list.  Choose fish and skinless chicken or [place] more often than red meat. Limit fish, poultry, and meat to 6 oz (170 g) each day.  Limit sweets, desserts, sugars, and sugary drinks.  Choose heart-healthy fats.  Limit cheese to 1 oz (28 g) per day.  Eat more home-cooked food and less restaurant, buffet, and fast food.  Limit fried foods.  Cook foods using methods other than frying.  Limit canned vegetables. If you do use them, rinse them well to  decrease the sodium.  When eating at a restaurant, ask that your food be prepared with less salt, or no salt if possible. WHAT FOODS CAN I EAT? Seek help from a dietitian for individual calorie needs. Grains Whole grain or whole wheat bread. Brown rice. Whole grain or whole wheat pasta. Quinoa, bulgur, and whole grain cereals. Low-sodium cereals. Corn or whole wheat flour tortillas. Whole grain cornbread. Whole grain crackers. Low-sodium crackers. Vegetables Fresh or frozen vegetables (raw, steamed, roasted, or grilled). Low-sodium or reduced-sodium tomato and vegetable juices. Low-sodium or reduced-sodium tomato sauce and paste. Low-sodium or reduced-sodium canned vegetables.  Fruits All fresh, canned (in natural juice), or frozen fruits. Meat and Other Protein Products Ground beef (85% or leaner), grass-fed beef, or beef trimmed of fat. Skinless chicken or [place]. Ground chicken or [place]. Pork trimmed of fat. All fish and seafood. Eggs. Dried beans, peas, or lentils. Unsalted nuts and seeds. Unsalted canned beans. Dairy Low-fat dairy products, such as skim or 1% milk, 2% or reduced-fat cheeses, low-fat ricotta or cottage cheese, or plain low-fat yogurt. Low-sodium or reduced-sodium cheeses. Fats and Oils Tub margarines without trans fats. Light or reduced-fat mayonnaise and salad dressings (reduced sodium). Avocado. Safflower, olive, or canola oils. Natural peanut or almond butter. Other Unsalted popcorn and pretzels. The items listed above may not be a complete list of recommended foods or beverages. Contact your dietitian for more options. WHAT FOODS ARE NOT RECOMMENDED? Grains White bread. White pasta. White rice. Refined cornbread. Bagels and croissants. Crackers that contain  trans fat. Vegetables Creamed or fried vegetables. Vegetables in a cheese sauce. Regular canned vegetables. Regular canned tomato sauce and paste. Regular tomato and vegetable juices. Fruits Dried fruits. Canned  fruit in light or heavy syrup. Fruit juice. Meat and Other Protein Products Fatty cuts of meat. Ribs, chicken wings, bacon, sausage, bologna, salami, chitterlings, fatback, hot dogs, bratwurst, and packaged luncheon meats. Salted nuts and seeds. Canned beans with salt. Dairy Whole or 2% milk, cream, half-and-half, and cream cheese. Whole-fat or sweetened yogurt. Full-fat cheeses or blue cheese. Nondairy creamers and whipped toppings. Processed cheese, cheese spreads, or cheese curds. Condiments Onion and garlic salt, seasoned salt, table salt, and sea salt. Canned and packaged gravies. Worcestershire sauce. Tartar sauce. Barbecue sauce. Teriyaki sauce. Soy sauce, including reduced sodium. Steak sauce. Fish sauce. Oyster sauce. Cocktail sauce. Horseradish. Ketchup and mustard. Meat flavorings and tenderizers. Bouillon cubes. Hot sauce. Tabasco sauce. Marinades. Taco seasonings. Relishes. Fats and Oils Butter, stick margarine, lard, shortening, ghee, and bacon fat. Coconut, palm kernel, or palm oils. Regular salad dressings. Other Pickles and olives. Salted popcorn and pretzels. The items listed above may not be a complete list of foods and beverages to avoid. Contact your dietitian for more information. WHERE CAN I FIND MORE INFORMATION? National Heart, Lung, and Blood Institute: www.nhlbi.nih.gov/health/health-topics/topics/dash/ Document Released: 03/10/2011 Document Revised: 08/05/2013 Document Reviewed: 01/23/2013 ExitCare Patient Information 2015 [employer]. This information is not intended to replace advice given to you by your health care provider. Make sure you discuss any questions you have with your health care provider.  

## 2015-09-10 NOTE — Progress Notes (Signed)
CPE AND FOLLOW UP  Assessment:   1. Essential hypertension - continue medications, DASH diet, exercise and monitor at home. Call if greater than 130/80.  - CBC with Differential/Platelet - BASIC METABOLIC PANEL WITH GFR - Hepatic function panel - TSH  2. PreDiabetes Discussed general issues about diabetes pathophysiology and management., Educational material distributed., Suggested low cholesterol diet., Encouraged aerobic exercise., Discussed foot care., Reminded to get yearly retinal exam. - Hemoglobin A1c  3. Morbid obesity, unspecified obesity type ([place]) Obesity with co morbidities- long discussion about weight loss, diet, and exercise  4. Hyperlipidemia -continue medications, check lipids, decrease fatty foods, increase activity.  - Lipid panel  5. Osteopenia Osteopenia- get dexa with GYN, continue Vit D and Ca, weight bearing exercises  6. Vitamin D deficiency - VITAMIN D 25 Hydroxy (Vit-D Deficiency, Fractures)  7. Medication management - Magnesium  8. Gastroesophageal reflux disease, esophagitis presence not specified Continue zantac, symptoms controlled  9. 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 AA female who presents for CPEand 3 month follow up on hypertension, prediabetes, hyperlipidemia, vitamin D def.   Her blood pressure has been controlled at home, today their BP is BP: 140/86 mmHg  BP Readings from Last 3 Encounters:  09/10/15 140/86  05/05/15 140/80  01/28/15 136/82   She does workout, does it in her house. She denies chest pain, shortness of breath, dizziness.  She is not on cholesterol medication and denies  myalgias. Her cholesterol is at goal. The cholesterol last visit was:   Lab Results  Component Value Date   CHOL 179 05/05/2015   HDL 49 05/05/2015   LDLCALC 103 05/05/2015   TRIG 133 05/05/2015   CHOLHDL 3.7 05/05/2015   She has been working on diet and exercise for prediabetes, and denies paresthesia of the feet, polydipsia, polyuria and visual disturbances. Last A1C in the office was:  Lab Results  Component Value Date   HGBA1C 5.7* 05/05/2015   Lab Results  Component Value Date   GFRAA 57* 05/05/2015   Patient is on Vitamin D supplement. Lab Results  Component Value Date   VD25OH 50 05/05/2015     BMI is Body mass index is 29.07 kg/(m^2)., she is working on diet and exercise. Wt Readings from Last 3 Encounters:  09/10/15 151 lb 6.4 oz (68.675 kg)  05/05/15 151 lb 12.8 oz (68.856 kg)  01/28/15 159 lb 12.8 oz (72.485 kg)    Medication Review Current Outpatient Prescriptions on File Prior to Visit  Medication Sig Dispense Refill  . amLODipine (NORVASC) 5 MG tablet Take 1 tablet (5 mg total) by mouth daily. 90 tablet 1  . Ascorbic Acid (VITAMIN C) 1000 MG tablet Take 1,000 mg by mouth daily.    Marland Kitchen aspirin EC 81 MG tablet Take 81 mg by mouth daily.    Marland Kitchen b complex vitamins tablet Take 1 tablet by mouth daily.    . Cholecalciferol (VITAMIN D) 2000 UNITS tablet Take 4,000 Units by mouth daily.     . ranitidine (ZANTAC) 300 MG tablet Take 1 to 2 tablets daily for heartburn & reflux to allow wean and transition from PPI / pantoprazole 180 tablet 99  .  vitamin E 200 UNIT capsule Take 200 Units by mouth daily.     No current facility-administered medications on file prior to visit.   Allergies  Allergen Reactions  . Nexium [Esomeprazole Magnesium] Other (See Comments)    Headache    Current Problems (verified) Patient Active Problem List   Diagnosis Date Noted  . BMI 30.68,  adult 01/28/2015  . Morbid obesity (BMI 30.68)   ([place]) 01/28/2015  . Vitamin D deficiency  06/20/2013  . Medication management 06/20/2013  . Hypertension   . Hyperlipidemia   . PreDiabetes   . GERD (gastroesophageal reflux disease)   . Osteopenia   . Fibroids     Screening Tests Immunization History  Administered Date(s) Administered  . Influenza, High Dose Seasonal PF 01/28/2015  . Influenza-Unspecified 01/29/2013, 12/27/2013  . PPD Test 02/25/2013  . Pneumococcal Polysaccharide-23 02/25/2013  . Td 09/13/2001, 02/20/2012  . Tdap 02/25/2013  . Zoster 10/23/2006   Preventative care: Last colonoscopy: 2014 due 2019 Last mammogram: 01/2015 Last pap smear/pelvic exam: 2015 at GYN q 2 years DEXA: 2014 at GYN stable osteopenia  Prior vaccinations: TD or Tdap: 2014  Influenza: 2016 Pneumococcal: 2014 Prevnar13: DUE out of in the office Shingles/Zostavax: 2008  Names of Other Physician/Practitioners you currently use: 1. [facility]- here for primary care 2. Dr. Gershon Crane, eye doctor, last visit 1 year ago, she is due, she wears glasses 3. Has partial, dentist, last visit 3 years ago Patient Care Team: Unk Pinto, MD as PCP - General (Internal Medicine) Jodi Marble, MD as Consulting Physician (Otolaryngology) Richmond Campbell, MD as Consulting Physician (Gastroenterology) Eldred Manges, MD as Consulting Physician (Obstetrics and Gynecology) Rutherford Guys, MD as Consulting Physician (Ophthalmology)  Past Surgical History  Procedure Laterality Date  . 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 Abdominal hysterectomy    . Appendectomy    . Tonsillectomy and adenoidectomy     Family History  Problem Relation Age of Onset  . Leukemia Mother   . Hypertension Father   . Diabetes Sister   . Heart disease Brother   . Diabetes Brother    Social History  Substance Use Topics  . Smoking status: Never Smoker   . Smokeless tobacco: None  . Alcohol Use: No    Objective:   Today's Vitals   09/10/15 1008  BP:  140/86  Pulse: 76  Temp: 97.9 F (36.6 C)  TempSrc: Temporal  Resp: 14  Height: 5' 0.5" (1.537 m)  Weight: 151 lb 6.4 oz (68.675 kg)  SpO2: 97%   Body mass index is 29.07 kg/(m^2).  General appearance: alert, no distress, WD/WN,  female HEENT: normocephalic, sclerae anicteric, TMs pearly, nares patent, no discharge or erythema, pharynx normal Oral cavity: MMM, no lesions Neck: supple, no lymphadenopathy, no thyromegaly, no masses Heart: RRR, normal S1, S2, no murmurs Lungs: CTA bilaterally, no wheezes, rhonchi, or rales Abdomen: +bs, soft, non tender, non distended, no masses, no hepatomegaly, no splenomegaly Musculoskeletal: nontender, no swelling, no obvious deformity Extremities: no edema, no cyanosis, no clubbing Pulses: 2+ symmetric, upper and lower extremities, normal cap refill Neurological: alert, oriented x 3, CN2-12 intact, strength normal upper extremities and lower extremities, sensation normal throughout, DTRs 2+ throughout, no cerebellar signs, gait normal Psychiatric: normal affect, behavior normal, pleasant  Breast: defer Gyn: defer Rectal: defer     Vicie Mutters, PA-C   09/10/2015

## 2015-09-10 NOTE — Addendum Note (Signed)
Addended by: Vicie Mutters R on: 09/10/2015 12:17 PM   Modules accepted: Orders, SmartSet

## 2015-09-11 LAB — VITAMIN D 25 HYDROXY (VIT D DEFICIENCY, FRACTURES): Vit D, 25-Hydroxy: 53 ng/mL (ref 30–100)

## 2015-09-20 NOTE — Addendum Note (Signed)
Addended by: HELMER, REBECCA A on: 09/20/2015 05:03 PM   Modules accepted: Orders

## 2015-12-11 ENCOUNTER — Encounter: Payer: Self-pay | Admitting: Internal Medicine

## 2015-12-11 ENCOUNTER — Ambulatory Visit (INDEPENDENT_AMBULATORY_CARE_PROVIDER_SITE_OTHER): Payer: Medicare Other | Admitting: Internal Medicine

## 2015-12-11 VITALS — BP 136/82 | HR 76 | Temp 97.0°F | Ht 60.5 in | Wt 148.8 lb

## 2015-12-11 DIAGNOSIS — R7309 Other abnormal glucose: Secondary | ICD-10-CM

## 2015-12-11 DIAGNOSIS — E785 Hyperlipidemia, unspecified: Secondary | ICD-10-CM

## 2015-12-11 DIAGNOSIS — K219 Gastro-esophageal reflux disease without esophagitis: Secondary | ICD-10-CM | POA: Diagnosis not present

## 2015-12-11 DIAGNOSIS — Z79899 Other long term (current) drug therapy: Secondary | ICD-10-CM

## 2015-12-11 DIAGNOSIS — I10 Essential (primary) hypertension: Secondary | ICD-10-CM

## 2015-12-11 DIAGNOSIS — E559 Vitamin D deficiency, unspecified: Secondary | ICD-10-CM | POA: Diagnosis not present

## 2015-12-11 DIAGNOSIS — Z23 Encounter for immunization: Secondary | ICD-10-CM

## 2015-12-11 DIAGNOSIS — N183 Chronic kidney disease, stage 3 unspecified: Secondary | ICD-10-CM

## 2015-12-11 LAB — CBC WITH DIFFERENTIAL/PLATELET
Basophils Absolute: 0 cells/uL (ref 0–200)
Basophils Relative: 0 %
Eosinophils Absolute: 116 cells/uL (ref 15–500)
Eosinophils Relative: 2 %
HCT: 43.7 % (ref 35.0–45.0)
Hemoglobin: 15.5 g/dL (ref 11.7–15.5)
Lymphocytes Relative: 44 %
Lymphs Abs: 2552 cells/uL (ref 850–3900)
MCH: 31.1 pg (ref 27.0–33.0)
MCHC: 35.5 g/dL (ref 32.0–36.0)
MCV: 87.6 fL (ref 80.0–100.0)
MPV: 11.6 fL (ref 7.5–12.5)
Monocytes Absolute: 754 cells/uL (ref 200–950)
Monocytes Relative: 13 %
Neutro Abs: 2378 cells/uL (ref 1500–7800)
Neutrophils Relative %: 41 %
Platelets: 173 10*3/uL (ref 140–400)
RBC: 4.99 MIL/uL (ref 3.80–5.10)
RDW: 13.5 % (ref 11.0–15.0)
WBC: 5.8 10*3/uL (ref 3.8–10.8)

## 2015-12-11 LAB — TSH: TSH: 2.35 mIU/L

## 2015-12-11 NOTE — Progress Notes (Signed)
[place] [facility] Unk Pinto, M.D.        Jacqueline Hancock. Silverio Lay, P.A.-C       Starlyn Skeans, P.A.-C   [facility]                [address] [place], [place] [id number] Telephone [phone] Telefax [phone] ______________________________________________________________________     This very nice 75 y.o. MBF presents for 3 month follow up with Hypertension, Hyperlipidemia, Pre-Diabetes and Vitamin D Deficiency. Patient also has hx/o GERD currently controlled with diet and medications.      Patient is treated for HTN circa 2000 & BP has been controlled at home. Today's BP is 136/82. Patient has had no complaints of any cardiac type chest pain, palpitations, dyspnea/orthopnea/PND, dizziness, claudication, or dependent edema.     Hyperlipidemia is controlled with diet & meds. Patient denies myalgias or other med SE's. Last Lipids were at goal: Lab Results  Component Value Date   CHOL 186 09/10/2015   HDL 57 09/10/2015   LDLCALC 100 09/10/2015   TRIG 143 09/10/2015   CHOLHDL 3.3 09/10/2015      Also, the patient has history of PreDiabetes with A1c 5.8% in and has had no symptoms of reactive hypoglycemia, diabetic polys, paresthesias or visual blurring.  Last A1c was at goal: Lab Results  Component Value Date   HGBA1C 5.5 09/10/2015      Further, the patient also has history of Vitamin D Deficiency of "36" in 2016 and supplements vitamin D without any suspected side-effects. Last vitamin D was still low:  Lab Results  Component Value Date   VD25OH 73 09/10/2015   Current Outpatient Prescriptions on File Prior to Visit  Medication Sig  . amLODipine (NORVASC) 5 MG tablet Take 1 tablet (5 mg total) by mouth daily.  . Ascorbic Acid (VITAMIN C) 1000 MG tablet Take 1,000 mg by mouth daily.  Marland Kitchen aspirin EC 81 MG tablet Take 81 mg by mouth daily.  Marland Kitchen b complex vitamins tablet Take 1 tablet by mouth daily.   . Cholecalciferol (VITAMIN D) 2000 UNITS tablet Take 4,000 Units by mouth daily.   . ranitidine (ZANTAC) 300 MG tablet Take 1 to 2 tablets daily for heartburn & reflux to allow wean and transition from PPI / pantoprazole  . vitamin E 200 UNIT capsule Take 200 Units by mouth daily.   No current facility-administered medications on file prior to visit.    Allergies  Allergen Reactions  . Nexium [Esomeprazole Magnesium] Other (See Comments)    Headache   PMHx:   Past Medical History:  Diagnosis Date  . Early cataracts, bilateral   . Elevated hemoglobin A1c   . Fibroids   . GERD (gastroesophageal reflux disease)   . Hyperlipidemia   . Hypertension   . Osteopenia    Immunization History  Administered Date(s) Administered  . Influenza, High Dose Seasonal PF 01/28/2015, 12/11/2015  . Influenza-Unspecified 01/29/2013, 12/27/2013  . PPD Test 02/25/2013  . Pneumococcal Polysaccharide-23 02/25/2013  . Td 09/13/2001, 02/20/2012  . Tdap 02/25/2013  . Zoster 10/23/2006   Past Surgical History:  Procedure Laterality Date  . ABDOMINAL HYSTERECTOMY    . APPENDECTOMY    . 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 TONSILLECTOMY AND ADENOIDECTOMY     FHx:    Reviewed / unchanged  SHx:    Reviewed / unchanged  Systems Review:  Constitutional: Denies fever, chills, wt changes, headaches, insomnia, fatigue, night sweats, change in appetite. Eyes: Denies redness, blurred vision, diplopia, discharge, itchy, watery eyes.  ENT: Denies discharge, congestion, post nasal drip, epistaxis, sore throat, earache, hearing loss, dental pain, tinnitus, vertigo, sinus pain, snoring.  CV: Denies chest pain, palpitations, irregular heartbeat, syncope, dyspnea, diaphoresis, orthopnea, PND, claudication or edema. Respiratory: denies cough, dyspnea, DOE, pleurisy, hoarseness, laryngitis, wheezing.  Gastrointestinal: Denies dysphagia, odynophagia, heartburn, reflux, water brash, abdominal pain or  cramps, nausea, vomiting, bloating, diarrhea, constipation, hematemesis, melena, hematochezia  or hemorrhoids. Genitourinary: Denies dysuria, frequency, urgency, nocturia, hesitancy, discharge, hematuria or flank pain. Musculoskeletal: Denies arthralgias, myalgias, stiffness, jt. swelling, pain, limping or strain/sprain.  Skin: Denies pruritus, rash, hives, warts, acne, eczema or change in skin lesion(s). Neuro: No weakness, tremor, incoordination, spasms, paresthesia or pain. Psychiatric: Denies confusion, memory loss or sensory loss. Endo: Denies change in weight, skin or hair change.  Heme/Lymph: No excessive bleeding, bruising or enlarged lymph nodes.  Physical Exam BP 136/82   Pulse 76   Temp 97 F (36.1 C)   Ht 5' 0.5" (1.537 m)   Wt 148 lb 12.8 oz (67.5 kg)   BMI 28.58 kg/m   Appears well nourished and in no distress.  Eyes: PERRLA, EOMs, conjunctiva no swelling or erythema. Sinuses: No frontal/maxillary tenderness ENT/Mouth: EAC's clear, TM's nl w/o erythema, bulging. Nares clear w/o erythema, swelling, exudates. Oropharynx clear without erythema or exudates. Oral hygiene is good. Tongue normal, non obstructing. Hearing intact.  Neck: Supple. Thyroid nl. Car 2+/2+ without bruits, nodes or JVD. Chest: Respirations nl with BS clear & equal w/o rales, rhonchi, wheezing or stridor.  Cor: Heart sounds normal w/ regular rate and rhythm without sig. murmurs, gallops, clicks, or rubs. Peripheral pulses normal and equal  without edema.  Abdomen: Soft & bowel sounds normal. Non-tender w/o guarding, rebound, hernias, masses, or organomegaly.  Lymphatics: Unremarkable.  Musculoskeletal: Full ROM all peripheral extremities, joint stability, 5/5 strength, and normal gait.  Skin: Warm, dry without exposed rashes, lesions or ecchymosis apparent.  Neuro: Cranial nerves intact, reflexes equal bilaterally. Sensory-motor testing grossly intact. Tendon reflexes grossly intact.  Pysch: Alert &  oriented x 3.  Insight and judgement nl & appropriate. No ideations.  Assessment and Plan:  1. Essential hypertension  - Continue medication, monitor blood pressure at home. Continue DASH diet. Reminder to go to the ER if any CP, SOB, nausea, dizziness, severe HA, changes vision/speech, left arm numbness and tingling and jaw pain. - TSH  2. Hyperlipidemia  - Continue diet/meds, exercise,& lifestyle modifications. Continue monitor periodic cholesterol/liver & renal functions  - Lipid panel - TSH  3. PreDiabetes  - Continue diet, exercise, lifestyle modifications. Monitor appropriate labs. - Hemoglobin A1c - Insulin, random  4. Vitamin D deficiency  - Continue supplementation. - VITAMIN D 25 Hydroxy   5. Gastroesophageal reflux disease   6. CKD (chronic kidney disease) stage 3, GFR 30-59 ml/min   7. Medication management  - CBC with Differential/Platelet - BASIC METABOLIC PANEL WITH GFR - Hepatic function panel - Magnesium  8. Need for prophylactic vaccination and inoculation against influenza  - Flu vaccine HIGH DOSE PF (Fluzone High dose)   Recommended regular exercise, BP monitoring, weight control, and discussed med and SE's. Recommended labs to assess and monitor clinical status. Further disposition pending results of labs. Over 30 minutes of exam, counseling, chart review was performed

## 2015-12-11 NOTE — Patient Instructions (Signed)

## 2015-12-12 LAB — BASIC METABOLIC PANEL WITHOUT GFR
BUN: 13 mg/dL (ref 7–25)
CO2: 24 mmol/L (ref 20–31)
Calcium: 10.2 mg/dL (ref 8.6–10.4)
Chloride: 103 mmol/L (ref 98–110)
Creat: 1.08 mg/dL — ABNORMAL HIGH (ref 0.60–0.93)
GFR, Est African American: 58 mL/min — ABNORMAL LOW (ref >=60)
GFR, Est Non African American: 50 mL/min — ABNORMAL LOW (ref >=60)
Glucose, Bld: 96 mg/dL (ref 65–99)
Potassium: 4.3 mmol/L (ref 3.5–5.3)
Sodium: 142 mmol/L (ref 135–146)

## 2015-12-12 LAB — MAGNESIUM: Magnesium: 2.3 mg/dL (ref 1.5–2.5)

## 2015-12-12 LAB — HEPATIC FUNCTION PANEL
ALT: 13 U/L (ref 6–29)
AST: 18 U/L (ref 10–35)
Albumin: 5 g/dL (ref 3.6–5.1)
Alkaline Phosphatase: 64 U/L (ref 33–130)
Bilirubin, Direct: 0.2 mg/dL (ref <=0.2)
Indirect Bilirubin: 1 mg/dL (ref 0.2–1.2)
Total Bilirubin: 1.2 mg/dL (ref 0.2–1.2)
Total Protein: 7.3 g/dL (ref 6.1–8.1)

## 2015-12-12 LAB — HEMOGLOBIN A1C
Hgb A1c MFr Bld: 5.2 % (ref <5.7)
Mean Plasma Glucose: 103 mg/dL

## 2015-12-12 LAB — LIPID PANEL
Cholesterol: 197 mg/dL (ref 125–200)
HDL: 65 mg/dL (ref >=46)
LDL Cholesterol: 115 mg/dL (ref <130)
Total CHOL/HDL Ratio: 3 ratio (ref <=5.0)
Triglycerides: 85 mg/dL (ref <150)
VLDL: 17 mg/dL (ref <30)

## 2015-12-12 LAB — VITAMIN D 25 HYDROXY (VIT D DEFICIENCY, FRACTURES): Vit D, 25-Hydroxy: 45 ng/mL (ref 30–100)

## 2015-12-12 LAB — INSULIN, RANDOM: Insulin: 16.2 u[IU]/mL (ref 2.0–19.6)

## 2015-12-18 ENCOUNTER — Ambulatory Visit: Payer: Self-pay | Admitting: Internal Medicine

## 2016-01-06 ENCOUNTER — Other Ambulatory Visit: Payer: Self-pay | Admitting: Obstetrics and Gynecology

## 2016-01-06 DIAGNOSIS — Z1231 Encounter for screening mammogram for malignant neoplasm of breast: Secondary | ICD-10-CM

## 2016-01-20 ENCOUNTER — Other Ambulatory Visit: Payer: Self-pay | Admitting: *Deleted

## 2016-01-20 MED ORDER — AMLODIPINE BESYLATE 5 MG PO TABS: 5.0000 mg | ORAL_TABLET | Freq: Every day | ORAL | 1 refills | Status: DC

## 2016-02-07 ENCOUNTER — Other Ambulatory Visit: Payer: Self-pay | Admitting: Physician Assistant

## 2016-03-11 ENCOUNTER — Other Ambulatory Visit: Payer: Self-pay | Admitting: Physician Assistant

## 2016-03-11 ENCOUNTER — Encounter: Payer: Self-pay | Admitting: Physician Assistant

## 2016-03-11 ENCOUNTER — Ambulatory Visit (INDEPENDENT_AMBULATORY_CARE_PROVIDER_SITE_OTHER): Payer: Medicare Other | Admitting: Physician Assistant

## 2016-03-11 ENCOUNTER — Ambulatory Visit
Admission: RE | Admit: 2016-03-11 | Discharge: 2016-03-11 | Disposition: A | Discharge status: Home / Self Care | Payer: Medicare Other | Source: Ambulatory Visit | Attending: Obstetrics and Gynecology | Admitting: Obstetrics and Gynecology

## 2016-03-11 VITALS — BP 128/80 | HR 84 | Temp 97.1°F | Resp 16 | Ht 60.5 in | Wt 148.8 lb

## 2016-03-11 DIAGNOSIS — N183 Chronic kidney disease, stage 3 unspecified: Secondary | ICD-10-CM

## 2016-03-11 DIAGNOSIS — Z79899 Other long term (current) drug therapy: Secondary | ICD-10-CM | POA: Diagnosis not present

## 2016-03-11 DIAGNOSIS — Z1231 Encounter for screening mammogram for malignant neoplasm of breast: Secondary | ICD-10-CM

## 2016-03-11 DIAGNOSIS — E785 Hyperlipidemia, unspecified: Secondary | ICD-10-CM | POA: Diagnosis not present

## 2016-03-11 DIAGNOSIS — I10 Essential (primary) hypertension: Secondary | ICD-10-CM | POA: Diagnosis not present

## 2016-03-11 NOTE — Patient Instructions (Signed)
    Bad carbs also include fruit juice, alcohol, and sweet tea. These are empty calories that do not signal to your brain that you are full.   Please remember the good carbs are still carbs which convert into sugar. So please measure them out no more than 1/2-1 cup of rice, oatmeal, pasta, and beans  Veggies are however free foods! Pile them on.   Not all fruit is created equal. Please see the list below, the fruit at the bottom is higher in sugars than the fruit at the top. Please avoid all dried fruits.     What is the TMJ? The temporomandibular (tem-PUH-ro-man-DIB-yoo-ler) joint, or the TMJ, connects the upper and lower jawbones. This joint allows the jaw to open wide and move back and forth when you chew, talk, or yawn.There are also several muscles that help this joint move. There can be muscle tightness and pain in the muscle that can cause several symptoms.  What causes TMJ pain? There are many causes of TMJ pain. Repeated chewing (for example, chewing gum) and clenching your teeth can cause pain in the joint. Some TMJ pain has no obvious cause. What can I do to ease the pain? There are many things you can do to help your pain get better. When you have pain:  Eat soft foods and stay away from chewy foods (for example, taffy) Try to use both sides of your mouth to chew Don't chew gum Massage Don't open your mouth wide (for example, during yawning or singing) Don't bite your cheeks or fingernails Lower your amount of stress and worry Applying a warm, damp washcloth to the joint may help. Over-the-counter pain medicines such as ibuprofen (one brand: Advil) or acetaminophen (one brand: Tylenol) might also help. Do not use these medicines if you are allergic to them or if your doctor told you not to use them. How can I stop the pain from coming back? When your pain is better, you can do these exercises to make your muscles stronger and to keep the pain from coming back:  Resisted mouth  opening: Place your thumb or two fingers under your chin and open your mouth slowly, pushing up lightly on your chin with your thumb. Hold for three to six seconds. Close your mouth slowly. Resisted mouth closing: Place your thumbs under your chin and your two index fingers on the ridge between your mouth and the bottom of your chin. Push down lightly on your chin as you close your mouth. Tongue up: Slowly open and close your mouth while keeping the tongue touching the roof of the mouth. Side-to-side jaw movement: Place an object about one fourth of an inch thick (for example, two tongue depressors) between your front teeth. Slowly move your jaw from side to side. Increase the thickness of the object as the exercise becomes easier Forward jaw movement: Place an object about one fourth of an inch thick between your front teeth and move the bottom jaw forward so that the bottom teeth are in front of the top teeth. Increase the thickness of the object as the exercise becomes easier. These exercises should not be painful. If it hurts to do these exercises, stop doing them and talk to your family doctor.

## 2016-03-11 NOTE — Progress Notes (Signed)
Patient ID: JORDAIN ABELSON, female   DOB: 1940/05/15, 75 y.o.   MRN: [id number]  Assessment and Plan:  Hypertension:  -Continue medication,  -monitor blood pressure at home.  -Continue DASH diet.   -Reminder to go to the ER if any CP, SOB, nausea, dizziness, severe HA, changes vision/speech, left arm numbness and tingling, and jaw pain.  Cholesterol: -Continue diet and exercise.  -Check cholesterol.   Pre-diabetes: -Continue diet and exercise.  -Check A1C  Vitamin D Def: -check level -continue medications.   Continue diet and meds as discussed. Further disposition pending results of labs. Future Appointments Date Time Provider [place]  03/11/2016 10:45 AM Vicie Mutters, PA-C [facility] None  03/11/2016 4:20 PM [facility]  06/10/2016 10:45 AM Unk Pinto, MD [facility] None  09/13/2016 10:00 AM Vicie Mutters, PA-C [facility] None    HPI 75 y.o. female  presents for 3 month follow up with hypertension, hyperlipidemia, prediabetes and vitamin D.   Her blood pressure has been controlled at home, today their BP is BP: 128/80.   She does workout.  She reports some mild stretching and doing a little bit of walking.   She denies chest pain, shortness of breath, dizziness.  Has appointment at 4 pm for [facility].   She is not on cholesterol medication and denies myalgias. Her cholesterol is not at goal. The cholesterol last visit was:   Lab Results  Component Value Date   CHOL 197 12/11/2015   HDL 65 12/11/2015   LDLCALC 115 12/11/2015   TRIG 85 12/11/2015   CHOLHDL 3.0 12/11/2015    She has been working on diet and exercise for prediabetes, and denies foot ulcerations, hyperglycemia, hypoglycemia , increased appetite, nausea, paresthesia of the feet, polydipsia, polyuria, visual disturbances, vomiting and weight loss. Last A1C in the office was:  Lab Results  Component Value Date   HGBA1C 5.2 12/11/2015   Patient is on Vitamin D supplement.  Lab  Results  Component Value Date   VD25OH 45 12/11/2015     BMI is Body mass index is 28.58 kg/m., she is working on diet and exercise. Wt Readings from Last 3 Encounters:  03/11/16 148 lb 12.8 oz (67.5 kg)  12/11/15 148 lb 12.8 oz (67.5 kg)  09/10/15 151 lb 6.4 oz (68.7 kg)    Current Medications:  Current Outpatient Prescriptions on File Prior to Visit  Medication Sig Dispense Refill  . amLODipine (NORVASC) 5 MG tablet Take 1 tablet (5 mg total) by mouth daily. 90 tablet 1  . Ascorbic Acid (VITAMIN C) 1000 MG tablet Take 1,000 mg by mouth daily.    Marland Kitchen aspirin EC 81 MG tablet Take 81 mg by mouth daily.    Marland Kitchen b complex vitamins tablet Take 1 tablet by mouth daily.    . Cholecalciferol (VITAMIN D) 2000 UNITS tablet Take 4,000 Units by mouth daily.     . ranitidine (ZANTAC) 300 MG tablet Take 1 to 2 tablets daily for heartburn & reflux to allow wean and transition from PPI / pantoprazole 180 tablet 99  . vitamin E 200 UNIT capsule Take 200 Units by mouth daily.     No current facility-administered medications on file prior to visit.     Medical History:  Past Medical History:  Diagnosis Date  . Early cataracts, bilateral   . Elevated hemoglobin A1c   . Fibroids   . GERD (gastroesophageal reflux disease)   . Hyperlipidemia   . Hypertension   . Osteopenia  Allergies:  Allergies  Allergen Reactions  . Nexium [Esomeprazole Magnesium] Other (See Comments)    Headache     Review of Systems:  Review of Systems  Constitutional: Negative for chills, fever and malaise/fatigue.  HENT: Negative for congestion, ear pain, sore throat and tinnitus.   Eyes: Negative.   Respiratory: Negative for cough, shortness of breath and wheezing.   Cardiovascular: Negative for chest pain, palpitations and leg swelling.  Gastrointestinal: Negative for blood in stool, constipation, diarrhea, heartburn, melena, nausea and vomiting.  Genitourinary: Negative.   Skin: Negative.   Neurological:  Negative for dizziness, sensory change and headaches.  Psychiatric/Behavioral: Negative for depression. The patient is not nervous/anxious and does not have insomnia.     Family history- Review and unchanged  Social history- Review and unchanged  Physical Exam: BP 128/80   Pulse 84   Temp 97.1 F (36.2 C)   Resp 16   Ht 5' 0.5" (1.537 m)   Wt 148 lb 12.8 oz (67.5 kg)   SpO2 99%   BMI 28.58 kg/m  Wt Readings from Last 3 Encounters:  03/11/16 148 lb 12.8 oz (67.5 kg)  12/11/15 148 lb 12.8 oz (67.5 kg)  09/10/15 151 lb 6.4 oz (68.7 kg)    General Appearance: Well nourished well developed, in no apparent distress. Eyes: PERRLA, EOMs, conjunctiva no swelling or erythema ENT/Mouth: Ear canals normal without obstruction, swelling, erythma, discharge.  TMs normal bilaterally.  Oropharynx moist, clear, without exudate, or postoropharyngeal swelling. Neck: Supple, thyroid normal,no cervical adenopathy  Respiratory: Respiratory effort normal, Breath sounds clear A&P without rhonchi, wheeze, or rale.  No retractions, no accessory usage. Cardio: RRR with no MRGs. Brisk peripheral pulses without edema.  Abdomen: Soft, + BS,  Non tender, no guarding, rebound, hernias, masses. Musculoskeletal: Full ROM, 5/5 strength, Normal gait Skin: Warm, dry without rashes, lesions, ecchymosis.  Neuro: Awake and oriented X 3, Cranial nerves intact. Normal muscle tone, no cerebellar symptoms. Psych: Normal affect, Insight and Judgment appropriate.    Vicie Mutters, PA-C 9:34 AM [facility]

## 2016-03-15 LAB — BASIC METABOLIC PANEL WITH GFR
BUN: 12 mg/dL (ref 7–25)
CO2: 30 mmol/L (ref 20–31)
Calcium: 10.3 mg/dL (ref 8.6–10.4)
Chloride: 104 mmol/L (ref 98–110)
Creat: 1.18 mg/dL — ABNORMAL HIGH (ref 0.60–0.93)
GFR, Est African American: 52 mL/min — ABNORMAL LOW (ref >=60)
GFR, Est Non African American: 45 mL/min — ABNORMAL LOW (ref >=60)
Glucose, Bld: 81 mg/dL (ref 65–99)
Potassium: 4.2 mmol/L (ref 3.5–5.3)
Sodium: 141 mmol/L (ref 135–146)

## 2016-03-15 LAB — CBC WITH DIFFERENTIAL/PLATELET
Basophils Absolute: 60 cells/uL (ref 0–200)
Basophils Relative: 1 %
Eosinophils Absolute: 180 cells/uL (ref 15–500)
Eosinophils Relative: 3 %
HCT: 45.6 % — ABNORMAL HIGH (ref 35.0–45.0)
Hemoglobin: 15.7 g/dL — ABNORMAL HIGH (ref 11.7–15.5)
Lymphocytes Relative: 47 %
Lymphs Abs: 2820 cells/uL (ref 850–3900)
MCH: 30.4 pg (ref 27.0–33.0)
MCHC: 34.4 g/dL (ref 32.0–36.0)
MCV: 88.4 fL (ref 80.0–100.0)
MPV: 12.1 fL (ref 7.5–12.5)
Monocytes Absolute: 540 cells/uL (ref 200–950)
Monocytes Relative: 9 %
Neutro Abs: 2400 cells/uL (ref 1500–7800)
Neutrophils Relative %: 40 %
Platelets: 192 10*3/uL (ref 140–400)
RBC: 5.16 MIL/uL — ABNORMAL HIGH (ref 3.80–5.10)
RDW: 13 % (ref 11.0–15.0)
WBC: 6 10*3/uL (ref 3.8–10.8)

## 2016-03-15 LAB — LIPID PANEL
Cholesterol: 204 mg/dL — ABNORMAL HIGH (ref <200)
HDL: 63 mg/dL (ref >50)
LDL Cholesterol: 113 mg/dL — ABNORMAL HIGH (ref <100)
Total CHOL/HDL Ratio: 3.2 Ratio (ref <5.0)
Triglycerides: 138 mg/dL (ref <150)
VLDL: 28 mg/dL (ref <30)

## 2016-03-15 LAB — HEPATIC FUNCTION PANEL
ALT: 14 U/L (ref 6–29)
AST: 20 U/L (ref 10–35)
Albumin: 5 g/dL (ref 3.6–5.1)
Alkaline Phosphatase: 66 U/L (ref 33–130)
Bilirubin, Direct: 0.2 mg/dL (ref <=0.2)
Indirect Bilirubin: 0.9 mg/dL (ref 0.2–1.2)
Total Bilirubin: 1.1 mg/dL (ref 0.2–1.2)
Total Protein: 7.2 g/dL (ref 6.1–8.1)

## 2016-03-15 LAB — TSH: TSH: 2.41 mIU/L

## 2016-03-15 LAB — MAGNESIUM: Magnesium: 2.1 mg/dL (ref 1.5–2.5)

## 2016-05-10 ENCOUNTER — Other Ambulatory Visit: Payer: Self-pay | Admitting: Internal Medicine

## 2016-05-30 DIAGNOSIS — H524 Presbyopia: Secondary | ICD-10-CM | POA: Diagnosis not present

## 2016-05-30 DIAGNOSIS — H5203 Hypermetropia, bilateral: Secondary | ICD-10-CM | POA: Diagnosis not present

## 2016-06-10 ENCOUNTER — Encounter: Payer: Self-pay | Admitting: Internal Medicine

## 2016-06-10 ENCOUNTER — Ambulatory Visit (INDEPENDENT_AMBULATORY_CARE_PROVIDER_SITE_OTHER): Payer: Medicare Other | Admitting: Internal Medicine

## 2016-06-10 VITALS — BP 122/64 | HR 72 | Temp 97.9°F | Resp 16 | Ht 60.5 in | Wt 146.6 lb

## 2016-06-10 DIAGNOSIS — I10 Essential (primary) hypertension: Secondary | ICD-10-CM | POA: Diagnosis not present

## 2016-06-10 DIAGNOSIS — E782 Mixed hyperlipidemia: Secondary | ICD-10-CM | POA: Diagnosis not present

## 2016-06-10 DIAGNOSIS — E559 Vitamin D deficiency, unspecified: Secondary | ICD-10-CM | POA: Diagnosis not present

## 2016-06-10 DIAGNOSIS — Z79899 Other long term (current) drug therapy: Secondary | ICD-10-CM

## 2016-06-10 DIAGNOSIS — R7309 Other abnormal glucose: Secondary | ICD-10-CM | POA: Diagnosis not present

## 2016-06-10 DIAGNOSIS — K219 Gastro-esophageal reflux disease without esophagitis: Secondary | ICD-10-CM | POA: Diagnosis not present

## 2016-06-10 LAB — LIPID PANEL
Cholesterol: 207 mg/dL — ABNORMAL HIGH (ref <200)
HDL: 68 mg/dL (ref >50)
LDL Cholesterol: 107 mg/dL — ABNORMAL HIGH (ref <100)
Total CHOL/HDL Ratio: 3 Ratio (ref <5.0)
Triglycerides: 161 mg/dL — ABNORMAL HIGH (ref <150)
VLDL: 32 mg/dL — ABNORMAL HIGH (ref <30)

## 2016-06-10 LAB — BASIC METABOLIC PANEL WITH GFR
BUN: 19 mg/dL (ref 7–25)
CO2: 26 mmol/L (ref 20–31)
Calcium: 10 mg/dL (ref 8.6–10.4)
Chloride: 101 mmol/L (ref 98–110)
Creat: 1.14 mg/dL — ABNORMAL HIGH (ref 0.60–0.93)
GFR, Est African American: 54 mL/min — ABNORMAL LOW (ref >=60)
GFR, Est Non African American: 47 mL/min — ABNORMAL LOW (ref >=60)
Glucose, Bld: 82 mg/dL (ref 65–99)
Potassium: 4.7 mmol/L (ref 3.5–5.3)
Sodium: 140 mmol/L (ref 135–146)

## 2016-06-10 LAB — HEPATIC FUNCTION PANEL
ALT: 17 U/L (ref 6–29)
AST: 20 U/L (ref 10–35)
Albumin: 5.1 g/dL (ref 3.6–5.1)
Alkaline Phosphatase: 75 U/L (ref 33–130)
Bilirubin, Direct: 0.2 mg/dL (ref <=0.2)
Indirect Bilirubin: 0.9 mg/dL (ref 0.2–1.2)
Total Bilirubin: 1.1 mg/dL (ref 0.2–1.2)
Total Protein: 7.5 g/dL (ref 6.1–8.1)

## 2016-06-10 LAB — MAGNESIUM: Magnesium: 2.1 mg/dL (ref 1.5–2.5)

## 2016-06-10 LAB — CBC WITH DIFFERENTIAL/PLATELET
Basophils Absolute: 0 cells/uL (ref 0–200)
Basophils Relative: 0 %
Eosinophils Absolute: 118 cells/uL (ref 15–500)
Eosinophils Relative: 2 %
HCT: 45.5 % — ABNORMAL HIGH (ref 35.0–45.0)
Hemoglobin: 15.4 g/dL (ref 11.7–15.5)
Lymphocytes Relative: 49 %
Lymphs Abs: 2891 cells/uL (ref 850–3900)
MCH: 30.8 pg (ref 27.0–33.0)
MCHC: 33.8 g/dL (ref 32.0–36.0)
MCV: 91 fL (ref 80.0–100.0)
MPV: 11.1 fL (ref 7.5–12.5)
Monocytes Absolute: 708 cells/uL (ref 200–950)
Monocytes Relative: 12 %
Neutro Abs: 2183 cells/uL (ref 1500–7800)
Neutrophils Relative %: 37 %
Platelets: 181 10*3/uL (ref 140–400)
RBC: 5 MIL/uL (ref 3.80–5.10)
RDW: 13.8 % (ref 11.0–15.0)
WBC: 5.9 10*3/uL (ref 3.8–10.8)

## 2016-06-10 LAB — TSH: TSH: 2.26 mIU/L

## 2016-06-10 MED ORDER — RANITIDINE HCL 300 MG PO TABS: ORAL_TABLET | ORAL | 1 refills | Status: DC

## 2016-06-10 NOTE — Patient Instructions (Signed)

## 2016-06-10 NOTE — Progress Notes (Signed)
[place] [facility] [facility] Unk Pinto, M.D.        Uvaldo Bristle. Silverio Lay, P.A.-C       Starlyn Skeans, P.A.-C  [facility]                [address] [place], [place] [address] Telephone [phone] Telefax [phone] ______________________________________________________________________     This very nice 76 y.o. MBF presents for 3 month follow up with Hypertension, Hyperlipidemia, Pre-Diabetes and Vitamin D Deficiency. Patient also has GERD & take Ranitidine sporadically and has occasional dyspeptic flares from GI indiscretions.      Patient is treated for HTN (2000)  & BP has been controlled at home. Today's BP is at goal - 122/64. Patient has had no complaints of any cardiac type chest pain, palpitations, dyspnea/orthopnea/PND, dizziness, claudication, or dependent edema.     Hyperlipidemia is controlled with diet & meds. Patient denies myalgias or other med SE's. Last Lipids were  Lab Results  Component Value Date   CHOL 204 (H) 03/11/2016   HDL 63 03/11/2016   LDLCALC 113 (H) 03/11/2016   TRIG 138 03/11/2016   CHOLHDL 3.2 03/11/2016      Also, the patient has history of PreDiabetes (A1c 5.8% in 2008) and has had no symptoms of reactive hypoglycemia, diabetic polys, paresthesias or visual blurring.  Last A1c was  Lab Results  Component Value Date   HGBA1C 5.2 12/11/2015      Further, the patient also has history of Vitamin D Deficiency ("36" in 2016)  and supplements vitamin D sporadically. Last vitamin D was   Lab Results  Component Value Date   VD25OH 45 12/11/2015   Current Outpatient Prescriptions on File Prior to Visit  Medication Sig  . amLODipine 5 MG tablet TAKE 1 TAB  DAILY  . VITAMIN C 1000 MG  Take  daily.  Marland Kitchen aspirin EC 81 MG  Take  daily.  Marland Kitchen b complex vitamins  Take 1 tab daily.  Marland Kitchen VITAMIN D 2000 UNITS  Take 4,000 Units  daily.   . ranitidine  300 MG  Take 1 to 2 tab daily for  heartburn & reflux  . vitamin E 200 UNIT  Take 200 Units by mouth daily.   Allergies  Allergen Reactions  . Nexium [Esomeprazole] Headache   PMHx:   Past Medical History:  Diagnosis Date  . Early cataracts, bilateral   . Elevated hemoglobin A1c   . Fibroids   . GERD (gastroesophageal reflux disease)   . Hyperlipidemia   . Hypertension   . Osteopenia    Immunization History  Administered Date(s) Administered  . Influenza, High Dose Seasonal PF 01/28/2015, 12/11/2015  . Influenza-Unspecified 01/29/2013, 12/27/2013  . PPD Test 02/25/2013  . Pneumococcal Polysaccharide-23 02/25/2013  . Td 09/13/2001, 02/20/2012  . Tdap 02/25/2013  . Zoster 10/23/2006   Past Surgical History:  Procedure Laterality Date  . ABDOMINAL HYSTERECTOMY    . APPENDECTOMY    . 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 TONSILLECTOMY AND ADENOIDECTOMY     FHx:    Reviewed / unchanged  SHx:    Reviewed / unchanged  Systems Review:  Constitutional: Denies fever, chills, wt changes, headaches, insomnia, fatigue, night sweats, change in appetite. Eyes: Denies redness, blurred vision, diplopia, discharge, itchy, watery eyes.  ENT: Denies discharge, congestion, post nasal drip, epistaxis, sore throat, earache, hearing loss,  dental pain, tinnitus, vertigo, sinus pain, snoring.  CV: Denies chest pain, palpitations, irregular heartbeat, syncope, dyspnea, diaphoresis, orthopnea, PND, claudication or edema. Respiratory: denies cough, dyspnea, DOE, pleurisy, hoarseness, laryngitis, wheezing.  Gastrointestinal: Denies dysphagia, odynophagia,  water brash, abdominal pain or cramps, nausea, vomiting, bloating, diarrhea, constipation, hematemesis, melena, hematochezia  or hemorrhoids. Has occasional heartburn &  Reflux sx's. Genitourinary: Denies dysuria, frequency, urgency, nocturia, hesitancy, discharge, hematuria or flank pain. Musculoskeletal: Denies arthralgias, myalgias, stiffness, jt. swelling, pain,  limping or strain/sprain.  Skin: Denies pruritus, rash, hives, warts, acne, eczema or change in skin lesion(s). Neuro: No weakness, tremor, incoordination, spasms, paresthesia or pain. Psychiatric: Denies confusion, memory loss or sensory loss. Endo: Denies change in weight, skin or hair change.  Heme/Lymph: No excessive bleeding, bruising or enlarged lymph nodes.  Physical Exam  BP 122/64   Pulse 72   Temp 97.9 F (36.6 C)   Resp 16   Ht 5' 0.5" (1.537 m)   Wt 146 lb 9.6 oz (66.5 kg)   BMI 28.16 kg/m   Appears well nourished and in no distress.  Eyes: PERRLA, EOMs, conjunctiva no swelling or erythema. Sinuses: No frontal/maxillary tenderness ENT/Mouth: EAC's clear, TM's nl w/o erythema, bulging. Nares clear w/o erythema, swelling, exudates. Oropharynx clear without erythema or exudates. Oral hygiene is good. Tongue normal, non obstructing. Hearing intact.  Neck: Supple. Thyroid nl. Car 2+/2+ without bruits, nodes or JVD. Chest: Respirations nl with BS clear & equal w/o rales, rhonchi, wheezing or stridor.  Cor: Heart sounds normal w/ regular rate and rhythm without sig. murmurs, gallops, clicks, or rubs. Peripheral pulses normal and equal  without edema.  Abdomen: Soft & bowel sounds normal. Non-tender w/o guarding, rebound, hernias, masses, or organomegaly.  Lymphatics: Unremarkable.  Musculoskeletal: Full ROM all peripheral extremities, joint stability, 5/5 strength, and normal gait.  Skin: Warm, dry without exposed rashes, lesions or ecchymosis apparent.  Neuro: Cranial nerves intact, reflexes equal bilaterally. Sensory-motor testing grossly intact. Tendon reflexes grossly intact.  Pysch: Alert & oriented x 3.  Insight and judgement nl & appropriate. No ideations.  Assessment and Plan:  1. Essential hypertension  - Continue medication, monitor blood pressure at home.  - Continue DASH diet. Reminder to go to the ER if any CP,  SOB, nausea, dizziness, severe HA, changes  vision/speech,  left arm numbness and tingling and jaw pain. - CBC with Differential/Platelet - BASIC METABOLIC PANEL WITH GFR - Magnesium - TSH  2. Mixed hyperlipidemia  - Continue diet/meds, exercise,& lifestyle modifications.  - Continue monitor periodic cholesterol/liver & renal functions  - Hepatic function panel - Lipid panel - TSH  3. PreDiabetes  - Continue diet, exercise, lifestyle modifications.  - Monitor appropriate labs. - Hemoglobin A1c - Insulin, random  4. Vitamin D deficiency  - Continue supplementation. - VITAMIN D 25 Hydroxy   5. Gastroesophageal reflux disease   6. Medication management  - CBC with Differential/Platelet - BASIC METABOLIC PANEL WITH GFR - Hepatic function panel - Magnesium - Lipid panel - TSH - Hemoglobin A1c - Insulin, random - VITAMIN D 25 Hydroxy        Recommended regular exercise, BP monitoring, weight control, and discussed med and SE's. Recommended labs to assess and monitor clinical status. Further disposition pending results of labs. Over 30 minutes of exam, counseling, chart review was performed

## 2016-06-11 LAB — HEMOGLOBIN A1C
Hgb A1c MFr Bld: 5.3 % (ref <5.7)
Mean Plasma Glucose: 105 mg/dL

## 2016-06-11 LAB — VITAMIN D 25 HYDROXY (VIT D DEFICIENCY, FRACTURES): Vit D, 25-Hydroxy: 44 ng/mL (ref 30–100)

## 2016-06-13 LAB — INSULIN, RANDOM: Insulin: 19 u[IU]/mL (ref 2.0–19.6)

## 2016-06-29 ENCOUNTER — Other Ambulatory Visit: Payer: Self-pay | Admitting: *Deleted

## 2016-06-29 MED ORDER — CIMETIDINE 400 MG PO TABS: 400.0000 mg | ORAL_TABLET | Freq: Two times a day (BID) | ORAL | 1 refills | Status: DC

## 2016-07-22 DIAGNOSIS — Z01419 Encounter for gynecological examination (general) (routine) without abnormal findings: Secondary | ICD-10-CM | POA: Diagnosis not present

## 2016-08-30 ENCOUNTER — Other Ambulatory Visit: Payer: Self-pay | Admitting: *Deleted

## 2016-08-30 MED ORDER — AMLODIPINE BESYLATE 5 MG PO TABS: 5.0000 mg | ORAL_TABLET | Freq: Every day | ORAL | 1 refills | Status: DC

## 2016-09-13 ENCOUNTER — Encounter: Payer: Self-pay | Admitting: Physician Assistant

## 2016-09-30 NOTE — Progress Notes (Signed)
CPE AND FOLLOW UP  Assessment:   Encounter for general adult medical examination with abnormal findings Declines prevnar this visit  Essential hypertension - continue medications, DASH diet, exercise and monitor at home. Call if greater than 130/80.  -     CBC with Differential/Platelet -     Hepatic function panel -     BASIC METABOLIC PANEL WITH GFR -     TSH -     Urinalysis, Routine w reflex microscopic -     Microalbumin / creatinine urine ratio -     EKG 12-Lead  CKD (chronic kidney disease) stage 3, GFR 30-59 ml/min Increase fluids, avoid NSAIDS, monitor sugars, will monitor -     BASIC METABOLIC PANEL WITH GFR  Hyperlipidemia, unspecified hyperlipidemia type -continue medications, check lipids, decrease fatty foods, increase activity.  -     Lipid panel  PreDiabetes Discussed general issues about diabetes pathophysiology and management., Educational material distributed., Suggested low cholesterol diet., Encouraged aerobic exercise., Discussed foot care., Reminded to get yearly retinal exam.  Medication management -     Magnesium  Morbid obesity (BMI 30.68)   (HCC) - long discussion about weight loss, diet, and exercise  Gastroesophageal reflux disease, esophagitis presence not specified Continue PPI/H2 blocker, diet discussed  Osteopenia, unspecified location Monitored at osteoporosis  Vitamin D deficiency Continue supplement  BMI 30.68,  adult  Anemia, unspecified type -     Iron and TIBC -     Vitamin B12  Cough  multifactorial-  get on PPI, may need to refer GI since intolerant of several meds.  Get on allergy pill, voice rest, suppress cough with OTC sugar free candy  Follow up if not better or if gets worse call office or go to ER   Over 30 minutes of exam, counseling, chart review, and critical decision making was performed Future Appointments Date Time Provider [place]  10/03/2016 2:00 PM Vicie Mutters, PA-C [facility] None   10/04/2017 2:00 PM Vicie Mutters, PA-C [facility] None     Subjective:   Jacqueline Hancock is a 76 y.o. AA female who presents for CPE and 3 month follow up on hypertension, prediabetes, hyperlipidemia, vitamin D def.   Patient started allergy pill Sunday, started to have sneezing, coughing, non productive, clear runny rhinorrhea, had some leg swelling at the end of the day, throat itching. Weight is stable, no fever or chills. No SOB, no orthopnea but sleeps on wedge for GERD. She is unable to tolerate nexium, zantac, and tagamet.   Her blood pressure has been controlled at home, today their BP is BP: 124/86  BP Readings from Last 3 Encounters:  10/03/16 124/86  06/10/16 122/64  03/11/16 128/80   She does workout, does it in her house. She denies chest pain, shortness of breath, dizziness.  She is not on cholesterol medication and denies myalgias. Her cholesterol is at goal. The cholesterol last visit was:   Lab Results  Component Value Date   CHOL 207 (H) 06/10/2016   HDL 68 06/10/2016   LDLCALC 107 (H) 06/10/2016   TRIG 161 (H) 06/10/2016   CHOLHDL 3.0 06/10/2016   She has been working on diet and exercise for prediabetes, and denies paresthesia of the feet, polydipsia, polyuria and visual disturbances. Last A1C in the office was:  Lab Results  Component Value Date   HGBA1C 5.3 06/10/2016   Lab Results  Component Value Date   GFRAA 54 (L) 06/10/2016   Patient is on Vitamin D supplement.  Lab Results  Component Value Date   VD25OH 44 06/10/2016     BMI is Body mass index is 28.62 kg/m., she is working on diet and exercise. Wt Readings from Last 3 Encounters:  10/03/16 149 lb (67.6 kg)  06/10/16 146 lb 9.6 oz (66.5 kg)  03/11/16 148 lb 12.8 oz (67.5 kg)    Medication Review Current Outpatient Prescriptions on File Prior to Visit  Medication Sig Dispense Refill  . amLODipine (NORVASC) 5 MG tablet Take 1 tablet (5 mg total) by mouth daily. 90 tablet 1  . Ascorbic Acid  (VITAMIN C) 1000 MG tablet Take 1,000 mg by mouth daily.    Marland Kitchen aspirin EC 81 MG tablet Take 81 mg by mouth daily.    Marland Kitchen b complex vitamins tablet Take 1 tablet by mouth daily.    . Cholecalciferol (VITAMIN D) 2000 UNITS tablet Take 4,000 Units by mouth daily.     . cimetidine (TAGAMET) 400 MG tablet Take 1 tablet (400 mg total) by mouth 2 (two) times daily. 180 tablet 1  . vitamin E 200 UNIT capsule Take 200 Units by mouth daily.     No current facility-administered medications on file prior to visit.    Allergies  Allergen Reactions  . Nexium [Esomeprazole Magnesium] Other (See Comments)    Headache    Current Problems (verified) Patient Active Problem List   Diagnosis Date Noted  . CKD (chronic kidney disease) stage 3, GFR 30-59 ml/min 09/10/2015  . BMI 30.68,  adult 01/28/2015  . Morbid obesity (BMI 30.68)   ([place]) 01/28/2015  . Vitamin D deficiency 06/20/2013  . Medication management 06/20/2013  . Hypertension   . Hyperlipidemia   . PreDiabetes   . GERD (gastroesophageal reflux disease)   . Osteopenia     Screening Tests Immunization History  Administered Date(s) Administered  . Influenza, High Dose Seasonal PF 01/28/2015, 12/11/2015  . Influenza-Unspecified 01/29/2013, 12/27/2013  . PPD Test 02/25/2013  . Pneumococcal Polysaccharide-23 02/25/2013  . Td 09/13/2001, 02/20/2012  . Tdap 02/25/2013  . Zoster 10/23/2006   Preventative care: Last colonoscopy: 2014 due 2019 Last mammogram: 03/2016 Last pap smear/pelvic exam: 2015 at GYN q 2 years DEXA: 2014 at GYN stable osteopenia CXR 2012  Prior vaccinations: TD or Tdap: 2014  Influenza: 2016 Pneumococcal: 2014 Prevnar13: Declines, wants next OV Shingles/Zostavax: 2008  Names of Other Physician/Practitioners you currently use: 1. [facility]- here for primary care 2. Dr. Gershon Crane, eye doctor, last visit Jan 2018  she wears glasses 3. Has partial, dentist, last visit 3 years  ago Patient Care Team: Unk Pinto, MD as PCP - General (Internal Medicine) Jodi Marble, MD as Consulting Physician (Otolaryngology) Richmond Campbell, MD as Consulting Physician (Gastroenterology) Leo Grosser Seymour Bars, MD as Consulting Physician (Obstetrics and Gynecology) Rutherford Guys, MD as Consulting Physician (Ophthalmology)  Allergies Allergies  Allergen Reactions  . Nexium [Esomeprazole Magnesium] Other (See Comments)    Headache    SURGICAL HISTORY She  has a past surgical history that includes Parotid gland tumor excision (2012); Abdominal hysterectomy; Appendectomy; and Tonsillectomy and adenoidectomy. FAMILY HISTORY Her family history includes Diabetes in her brother and sister; Heart disease in her brother; Hypertension in her father; Leukemia in her mother. SOCIAL HISTORY She  reports that she has never smoked. She has never used smokeless tobacco. She reports that she does not drink alcohol.  Objective:   Today's Vitals   10/03/16 1339  BP: 124/86  Pulse: 78  Resp: 16  Temp:  97.2 F (36.2 C)  SpO2: 98%  Weight: 149 lb (67.6 kg)  Height: 5' 0.5" (1.537 m)  PainSc: 5    Body mass index is 28.62 kg/m.  General appearance: alert, no distress, WD/WN,  female HEENT: normocephalic, sclerae anicteric, TMs pearly, nares patent, no discharge or erythema, pharynx normal Oral cavity: MMM, no lesions Neck: supple, no lymphadenopathy, no thyromegaly, no masses Heart: RRR, normal S1, S2, no murmurs Lungs: CTA bilaterally, no wheezes, rhonchi, or rales Abdomen: +bs, soft, non tender, non distended, no masses, no hepatomegaly, no splenomegaly Musculoskeletal: nontender, no swelling, no obvious deformity Extremities: no edema, no cyanosis, no clubbing Pulses: 2+ symmetric, upper and lower extremities, normal cap refill Neurological: alert, oriented x 3, CN2-12 intact, strength normal upper extremities and lower extremities, sensation normal throughout, DTRs 2+  throughout, no cerebellar signs, gait normal Psychiatric: normal affect, behavior normal, pleasant  Breast: defer Gyn: defer Rectal: defer     Vicie Mutters, PA-C   10/03/2016

## 2016-10-03 ENCOUNTER — Encounter: Payer: Self-pay | Admitting: Physician Assistant

## 2016-10-03 ENCOUNTER — Ambulatory Visit (INDEPENDENT_AMBULATORY_CARE_PROVIDER_SITE_OTHER): Payer: Medicare Other | Admitting: Physician Assistant

## 2016-10-03 VITALS — BP 124/86 | HR 78 | Temp 97.2°F | Resp 16 | Ht 60.5 in | Wt 149.0 lb

## 2016-10-03 DIAGNOSIS — I10 Essential (primary) hypertension: Secondary | ICD-10-CM

## 2016-10-03 DIAGNOSIS — E785 Hyperlipidemia, unspecified: Secondary | ICD-10-CM

## 2016-10-03 DIAGNOSIS — M8580 Other specified disorders of bone density and structure, unspecified site: Secondary | ICD-10-CM | POA: Diagnosis not present

## 2016-10-03 DIAGNOSIS — Z79899 Other long term (current) drug therapy: Secondary | ICD-10-CM | POA: Diagnosis not present

## 2016-10-03 DIAGNOSIS — K219 Gastro-esophageal reflux disease without esophagitis: Secondary | ICD-10-CM

## 2016-10-03 DIAGNOSIS — Z136 Encounter for screening for cardiovascular disorders: Secondary | ICD-10-CM | POA: Diagnosis not present

## 2016-10-03 DIAGNOSIS — R059 Cough, unspecified: Secondary | ICD-10-CM

## 2016-10-03 DIAGNOSIS — R6889 Other general symptoms and signs: Secondary | ICD-10-CM | POA: Diagnosis not present

## 2016-10-03 DIAGNOSIS — Z6830 Body mass index (BMI) 30.0-30.9, adult: Secondary | ICD-10-CM

## 2016-10-03 DIAGNOSIS — N183 Chronic kidney disease, stage 3 unspecified: Secondary | ICD-10-CM

## 2016-10-03 DIAGNOSIS — R7309 Other abnormal glucose: Secondary | ICD-10-CM

## 2016-10-03 DIAGNOSIS — R05 Cough: Secondary | ICD-10-CM

## 2016-10-03 DIAGNOSIS — Z0001 Encounter for general adult medical examination with abnormal findings: Secondary | ICD-10-CM | POA: Diagnosis not present

## 2016-10-03 DIAGNOSIS — D649 Anemia, unspecified: Secondary | ICD-10-CM | POA: Diagnosis not present

## 2016-10-03 DIAGNOSIS — E559 Vitamin D deficiency, unspecified: Secondary | ICD-10-CM | POA: Diagnosis not present

## 2016-10-03 LAB — TSH: TSH: 2.23 mIU/L

## 2016-10-03 LAB — LIPID PANEL
Cholesterol: 182 mg/dL (ref <200)
HDL: 53 mg/dL (ref >50)
LDL Cholesterol: 94 mg/dL (ref <100)
Total CHOL/HDL Ratio: 3.4 Ratio (ref <5.0)
Triglycerides: 174 mg/dL — ABNORMAL HIGH (ref <150)
VLDL: 35 mg/dL — ABNORMAL HIGH (ref <30)

## 2016-10-03 LAB — CBC WITH DIFFERENTIAL/PLATELET
Basophils Absolute: 0 cells/uL (ref 0–200)
Basophils Relative: 0 %
Eosinophils Absolute: 178 cells/uL (ref 15–500)
Eosinophils Relative: 2 %
HCT: 43.8 % (ref 35.0–45.0)
Hemoglobin: 14.9 g/dL (ref 11.7–15.5)
Lymphocytes Relative: 41 %
Lymphs Abs: 3649 cells/uL (ref 850–3900)
MCH: 30.5 pg (ref 27.0–33.0)
MCHC: 34 g/dL (ref 32.0–36.0)
MCV: 89.8 fL (ref 80.0–100.0)
MPV: 11.2 fL (ref 7.5–12.5)
Monocytes Absolute: 801 cells/uL (ref 200–950)
Monocytes Relative: 9 %
Neutro Abs: 4272 cells/uL (ref 1500–7800)
Neutrophils Relative %: 48 %
Platelets: 202 10*3/uL (ref 140–400)
RBC: 4.88 MIL/uL (ref 3.80–5.10)
RDW: 13.4 % (ref 11.0–15.0)
WBC: 8.9 10*3/uL (ref 3.8–10.8)

## 2016-10-03 LAB — BASIC METABOLIC PANEL WITH GFR
BUN: 15 mg/dL (ref 7–25)
CO2: 25 mmol/L (ref 20–31)
Calcium: 10.1 mg/dL (ref 8.6–10.4)
Chloride: 98 mmol/L (ref 98–110)
Creat: 1.21 mg/dL — ABNORMAL HIGH (ref 0.60–0.93)
GFR, Est African American: 50 mL/min — ABNORMAL LOW (ref >=60)
GFR, Est Non African American: 44 mL/min — ABNORMAL LOW (ref >=60)
Glucose, Bld: 96 mg/dL (ref 65–99)
Potassium: 4.5 mmol/L (ref 3.5–5.3)
Sodium: 139 mmol/L (ref 135–146)

## 2016-10-03 LAB — IRON AND TIBC
%SAT: 12 % (ref 11–50)
Iron: 44 ug/dL — ABNORMAL LOW (ref 45–160)
TIBC: 368 ug/dL (ref 250–450)
UIBC: 324 ug/dL

## 2016-10-03 LAB — HEPATIC FUNCTION PANEL
ALT: 15 U/L (ref 6–29)
AST: 19 U/L (ref 10–35)
Albumin: 4.9 g/dL (ref 3.6–5.1)
Alkaline Phosphatase: 76 U/L (ref 33–130)
Bilirubin, Direct: 0.2 mg/dL (ref <=0.2)
Indirect Bilirubin: 0.8 mg/dL (ref 0.2–1.2)
Total Bilirubin: 1 mg/dL (ref 0.2–1.2)
Total Protein: 7.5 g/dL (ref 6.1–8.1)

## 2016-10-03 LAB — MAGNESIUM: Magnesium: 2.2 mg/dL (ref 1.5–2.5)

## 2016-10-03 NOTE — Patient Instructions (Addendum)
Get back on omeprazole 20mg  from over the counter for 2 weeks See if this helps the cough Stay on the allergy pill Call the office if you have any worsening cough, or if you get any fever/chills.   Common causes of cough OR hoarseness OR sore throat:   Allergies, Viral Infections, Acid Reflux and Bacterial Infections.  1) Allergies and viral infections cause a cough OR sore throat by post nasal drip and are often worse at night, can also have sneezing, lower grade fevers, clear/yellow mucus. This is best treated with allergy medications or nasal sprays.  Please get on allegra for 1-2 weeks The strongest is allegra or fexafinadine  Cheapest at [employer], [employer], [employer]  2) Bacterial infections are more severe than allergies or viral infections with fever, teeth pain, fatigue. This can be treated with prednisone and the same over the counter medication and after 7 days can be treated with an antibiotic.   3) Silent reflux/GERD can cause a cough OR sore throat OR hoarseness WITHOUT heart burn because the esophagus that goes to the stomach and trachea that goes to the lungs are very close and when you lay down the acid can irritate your throat and lungs. This can cause hoarseness, cough, and wheezing. Please stop any alcohol or anti-inflammatories like aleve/advil/ibuprofen and start an over the counter Prilosec or omeprazole 1-2 times daily 65mins before food for 2 weeks, then switch to over the counter zantac/ratinidine or pepcid/famotadine once at night for 2 weeks.   4) sometimes irritation causes more irritation. Try voice rest, use sugar free cough drops to prevent coughing, and try to stop clearing your throat.   If you ever have a cough that does not go away after trying these things please make a follow up visit for further evaluation or we can refer you to a specialist. Or if you ever have shortness of breath or chest pain go to the ER.    Please pick one of the over the counter allergy  medications below and take it once daily for allergies.  Claritin or loratadine cheapest but likely the weakest  Zyrtec or certizine at night because it can make you sleepy The strongest is allegra or fexafinadine  Cheapest at [employer], [employer], [employer]  - Try the Flonase or Nasonex. Remember to spray each nostril twice towards the outer part of your eye.  Do not sniff but instead pinch your nose and tilt your head back to help the medicine get into your sinuses.  The best time to do this is at bedtime.Stop if you get blurred vision or nose bleeds.  -While drinking fluids, pinch and hold nose close and swallow, to help open eustachian tubes to drain fluid behind ear drums.  if worsening HA, changes vision/speech, imbalance, weakness go to the ER   Simple math prevails.    1st - exercise does not produce significant weight loss - at best one converts fat into muscle , "bulks up", loses inches, but usually stays "weight neutral"     2nd - think of your body weightas a check book: If you eat more calories than you burn up - you save money or gain weight .... Or if you spend more money than you put in the check book, ie burn up more calories than you eat, then you lose weight     3rd - if you walk or run 1 mile, you burn up 100 calories - you have to burn up 3,500 calories to lose 1  pound, ie you have to walk/run 35 miles to lose 1 measly pound. So if you want to lose 10 #, then you have to walk/run 350 miles, so.... clearly exercise is not the solution.     4. So if you consume 1,500 calories, then you have to burn up the equivalent of 15 miles to stay weight neutral - It also stands to reason that if you consume 1,500 cal/day and don't lose weight, then you must be burning up about 1,500 cals/day to stay weight neutral.     5. If you really want to lose weight, you must cut your calorie intake 300 calories /day and at that rate you should lose about 1 # every 3 days.   6. Please purchase Dr Fara Olden  Fuhrman's book(s) "The End of Dieting" & "Eat to Live" . It has some great concepts and recipes.

## 2016-10-04 LAB — URINALYSIS, ROUTINE W REFLEX MICROSCOPIC
Bilirubin Urine: NEGATIVE
Glucose, UA: NEGATIVE
Ketones, ur: NEGATIVE
Nitrite: NEGATIVE
Protein, ur: NEGATIVE
Specific Gravity, Urine: 1.016 (ref 1.001–1.035)
pH: 6.5 (ref 5.0–8.0)

## 2016-10-04 LAB — VITAMIN B12: Vitamin B-12: 497 pg/mL (ref 200–1100)

## 2016-10-04 LAB — URINALYSIS, MICROSCOPIC ONLY
Bacteria, UA: NONE SEEN [HPF]
Casts: NONE SEEN [LPF]
Crystals: NONE SEEN [HPF]
RBC / HPF: NONE SEEN RBC/HPF (ref <=2)
Yeast: NONE SEEN [HPF]

## 2016-10-04 LAB — MICROALBUMIN / CREATININE URINE RATIO
Creatinine, Urine: 174 mg/dL (ref 20–320)
Microalb Creat Ratio: 8 mcg/mg creat (ref <30)
Microalb, Ur: 1.4 mg/dL

## 2016-10-06 NOTE — Progress Notes (Signed)
Pt aware of lab results & voiced understanding of those results.

## 2017-01-10 ENCOUNTER — Other Ambulatory Visit: Payer: Self-pay | Admitting: Internal Medicine

## 2017-01-23 NOTE — Progress Notes (Signed)
MEDICARE ANNUAL WELLNESS VISIT AND FOLLOW UP  Assessment:    Essential hypertension - continue medications, DASH diet, exercise and monitor at home. Call if greater than 130/80.  -     CBC with Differential/Platelet -     BASIC METABOLIC PANEL WITH GFR -     Hepatic function panel -     TSH  CKD (chronic kidney disease) stage 3, GFR 30-59 ml/min (HCC) -     BASIC METABOLIC PANEL WITH GFR - Increase fluids, avoid NSAIDS, monitor sugars, will monitor  Hyperlipidemia, unspecified hyperlipidemia type -     Lipid panel -continue medications, check lipids, decrease fatty foods, increase activity.   Medication management -     Magnesium  PreDiabetes Discussed general issues about diabetes pathophysiology and management., Educational material distributed., Suggested low cholesterol diet., Encouraged aerobic exercise., Discussed foot care., Reminded to get yearly retinal exam.  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, doing heating pad and call back  Over 30 minutes of exam, counseling, chart review, and critical decision making was performed Future Appointments Date Time Provider [place]  10/04/2017 2:00 PM Vicie Mutters, PA-C [facility] None    Plan:   During the course of the visit the patient was educated and counseled about appropriate screening and preventive services including:    Pneumococcal vaccine   Influenza vaccine  Td vaccine  Prevnar 13  Screening electrocardiogram  Screening  mammography  Bone densitometry screening  Colorectal cancer screening  Diabetes screening  Glaucoma screening  Nutrition counseling   Advanced directives: given info/requested copies  Subjective:   LAVERNA DOSSETT is a 76 y.o. AA female who presents for Medicare Annual Wellness Visit and 3 month follow up on hypertension, prediabetes, hyperlipidemia, vitamin D def.   She states x 4 months, when she chews/eats, she will have clear liquid rolls down her right ear/neck, states will actually feel the liquid, will have soreness. 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 She does have GERd, she does feel like there is something in her throat.   Her blood pressure has been controlled at home, today their BP is BP: 120/80 She does workout, does it in her house. She denies chest pain, shortness of breath, dizziness.  She is not on cholesterol medication and denies myalgias. Her cholesterol is at goal. The cholesterol last visit was:   Lab Results  Component Value Date   CHOL 182 10/03/2016   HDL 53 10/03/2016   LDLCALC 94 10/03/2016   TRIG 174 (H) 10/03/2016   CHOLHDL 3.4 10/03/2016   She has been working on diet and exercise for prediabetes, and denies paresthesia of the feet, polydipsia, polyuria and visual disturbances. Last A1C in the office was:  Lab Results  Component Value Date   HGBA1C 5.3 06/10/2016   Lab Results  Component Value Date   GFRAA 50 (L) 10/03/2016   Patient is on Vitamin D supplement. Lab Results  Component Value Date   VD25OH 44 06/10/2016     BMI is Body mass index is 29.31 kg/m., she is  working on diet and exercise. Wt Readings from Last 3 Encounters:  01/24/17 152 lb 9.6 oz (69.2 kg)  10/03/16 149 lb (67.6 kg)  06/10/16 146 lb 9.6 oz (66.5 kg)    Medication Review Current Outpatient Prescriptions on File Prior to Visit  Medication Sig Dispense Refill  . amLODipine (NORVASC) 5 MG tablet TAKE 1 TABLET BY MOUTH  DAILY 90 tablet 1  . Ascorbic  Acid (VITAMIN C) 1000 MG tablet Take 1,000 mg by mouth daily.    Marland Kitchen aspirin EC 81 MG tablet Take 81 mg by mouth daily.    Marland Kitchen b complex vitamins tablet Take 1 tablet by mouth daily.    . Cholecalciferol (VITAMIN D) 2000 UNITS tablet Take 4,000 Units by mouth daily.     . vitamin E 200 UNIT capsule Take 200 Units by mouth daily.     No current facility-administered medications on file prior to visit.     Current Problems (verified) Patient Active Problem List   Diagnosis Date Noted  . CKD (chronic kidney disease) stage 3, GFR 30-59 ml/min (HCC) 09/10/2015  . BMI 30.68,  adult 01/28/2015  . Vitamin D deficiency 06/20/2013  . Medication management 06/20/2013  . Hypertension   . Hyperlipidemia   . PreDiabetes   . GERD (gastroesophageal reflux disease)   . Osteopenia     Screening Tests Immunization History  Administered Date(s) Administered  . Influenza, High Dose Seasonal PF 01/28/2015, 12/11/2015  . Influenza-Unspecified 01/29/2013, 12/27/2013  . PPD Test 02/25/2013  . Pneumococcal Conjugate-13 01/24/2017  . Pneumococcal Polysaccharide-23 02/25/2013  . Td 09/13/2001, 02/20/2012  . Tdap 02/25/2013  . Zoster 10/23/2006   Preventative care: Last colonoscopy: 2014 due 2019 Last mammogram: 03/2016 Last pap smear/pelvic exam: 2015 at GYN q 2 years DEXA: 2014 at GYN stable osteopenia CXR 2012  Prior vaccinations: TD or Tdap: 2014  Influenza: out of high dose flu in the office Pneumococcal: 2014 Prevnar13: 2018 Shingles/Zostavax: 2008  Names of Other Physician/Practitioners you currently use: 1. [facility]- here for primary care 2. Dr. Gershon Crane, eye doctor, last visit Jan 2018 3. Has partial, dentist, last visit 2 years Patient Care Team: Unk Pinto, MD as PCP - General (Internal Medicine) Jodi Marble, MD as Consulting Physician (Otolaryngology) Richmond Campbell, MD as Consulting Physician (Gastroenterology) Leo Grosser Seymour Bars,  MD as Consulting Physician (Obstetrics and Gynecology) Rutherford Guys, MD as Consulting Physician (Ophthalmology)  Allergies Allergies  Allergen Reactions  . Nexium [Esomeprazole Magnesium] Other (See Comments)    Headache    SURGICAL HISTORY She  has a past surgical history that includes Parotid gland tumor excision (2012); Abdominal hysterectomy; Appendectomy; and Tonsillectomy and adenoidectomy. FAMILY HISTORY Her family history includes Diabetes in her brother and sister; Heart disease in her brother; Hypertension in her father; Leukemia in her mother. SOCIAL HISTORY She  reports that she has never smoked. She has never used smokeless tobacco. She reports that she does not drink alcohol.  MEDICARE WELLNESS OBJECTIVES: Physical activity: Current Exercise Habits: Home exercise routine, Type of exercise: walking;strength training/weights, Time (Minutes): 15, Frequency (Times/Week): 6, Weekly Exercise (Minutes/Week): 90, Intensity: Mild Cardiac risk factors: Cardiac Risk Factors include: advanced age (>31men, >59 women);dyslipidemia;hypertension;sedentary lifestyle Depression/mood screen:   Depression screen [facility] 2/9 01/24/2017  Decreased Interest 0  Down, Depressed, Hopeless 0  PHQ - 2 Score 0    ADLs:  In your present state of health, do you have any difficulty performing the following activities: 01/24/2017 06/10/2016  Hearing? N N  Vision? N N  Difficulty concentrating or making decisions? N N  Walking or climbing stairs? N N  Dressing or bathing? N N  Doing errands, shopping? N N  Some recent data might be hidden     Cognitive Testing  Alert? Yes  Normal Appearance?Yes  Oriented to person? Yes  Place? Yes   Time? Yes  Recall of three objects?  Yes  Can perform simple calculations? Yes  Displays appropriate judgment?Yes  Can read the correct time from a watch face?Yes  EOL planning: Does Patient Have a Medical Advance Directive?: Yes Type of Advance Directive:  Healthcare Power of Attorney, Living will Copy of [place] in Chart?: No - copy requested   Objective:   Today's Vitals   01/24/17 0915  BP: 120/80  Pulse: 77  Resp: 14  Temp: (!) 97.4 F (36.3 C)  SpO2: 99%  Weight: 152 lb 9.6 oz (69.2 kg)  Height: 5' 0.5" (1.537 m)  PainSc: 0-No pain   Body mass index is 29.31 kg/m.  General appearance: alert, no distress, WD/WN,  female HEENT: normocephalic, sclerae anicteric, TMs pearly, nares patent, no discharge or erythema, pharynx normal Oral cavity: MMM, no lesions Neck: supple, no lymphadenopathy, no thyromegaly, no masses Heart: RRR, normal S1, S2, no murmurs Lungs: CTA bilaterally, no wheezes, rhonchi, or rales Abdomen: +bs, soft, + epigastric tender, non distended, no masses, no hepatomegaly, no splenomegaly Musculoskeletal: nontender, no swelling, no obvious deformity Extremities: no edema, no cyanosis, no clubbing Pulses: 2+ symmetric, upper and lower extremities, normal cap refill Neurological: alert, oriented x 3, CN2-12 intact, strength normal upper extremities and lower extremities, sensation normal throughout, DTRs 2+ throughout, no cerebellar signs, gait normal Psychiatric: normal affect, behavior normal, pleasant  Skin: seb cyst right post auricular, no lymphadenopathy Breast: defer Gyn: defer Rectal: defer   Medicare Attestation I have personally reviewed: The patient's medical and social history Their use of alcohol, tobacco or illicit drugs Their current medications and supplements The patient's functional ability including ADLs,fall risks, home safety risks, cognitive, and hearing and visual impairment Diet and physical activities Evidence for depression or mood disorders  The patient's weight, height, BMI, and visual acuity have been recorded in the chart.  I have made referrals, counseling, and provided education to the patient based on review of the above and I have provided the patient  with a written personalized care plan for preventive services.     Vicie Mutters, PA-C   01/24/2017

## 2017-01-24 ENCOUNTER — Encounter: Payer: Self-pay | Admitting: Physician Assistant

## 2017-01-24 ENCOUNTER — Ambulatory Visit (INDEPENDENT_AMBULATORY_CARE_PROVIDER_SITE_OTHER): Payer: Medicare Other | Admitting: Physician Assistant

## 2017-01-24 VITALS — BP 120/80 | HR 77 | Temp 97.4°F | Resp 14 | Ht 60.5 in | Wt 152.6 lb

## 2017-01-24 DIAGNOSIS — M8580 Other specified disorders of bone density and structure, unspecified site: Secondary | ICD-10-CM

## 2017-01-24 DIAGNOSIS — R7309 Other abnormal glucose: Secondary | ICD-10-CM | POA: Diagnosis not present

## 2017-01-24 DIAGNOSIS — I10 Essential (primary) hypertension: Secondary | ICD-10-CM

## 2017-01-24 DIAGNOSIS — H9201 Otalgia, right ear: Secondary | ICD-10-CM | POA: Diagnosis not present

## 2017-01-24 DIAGNOSIS — Z0001 Encounter for general adult medical examination with abnormal findings: Secondary | ICD-10-CM | POA: Diagnosis not present

## 2017-01-24 DIAGNOSIS — E559 Vitamin D deficiency, unspecified: Secondary | ICD-10-CM

## 2017-01-24 DIAGNOSIS — N183 Chronic kidney disease, stage 3 unspecified: Secondary | ICD-10-CM

## 2017-01-24 DIAGNOSIS — Z79899 Other long term (current) drug therapy: Secondary | ICD-10-CM | POA: Diagnosis not present

## 2017-01-24 DIAGNOSIS — K219 Gastro-esophageal reflux disease without esophagitis: Secondary | ICD-10-CM | POA: Diagnosis not present

## 2017-01-24 DIAGNOSIS — R6889 Other general symptoms and signs: Secondary | ICD-10-CM | POA: Diagnosis not present

## 2017-01-24 DIAGNOSIS — E785 Hyperlipidemia, unspecified: Secondary | ICD-10-CM | POA: Diagnosis not present

## 2017-01-24 DIAGNOSIS — Z0000 Encounter for general adult medical examination without abnormal findings: Secondary | ICD-10-CM

## 2017-01-24 DIAGNOSIS — Z23 Encounter for immunization: Secondary | ICD-10-CM

## 2017-01-24 LAB — CBC WITH DIFFERENTIAL/PLATELET
Basophils Absolute: 32 cells/uL (ref 0–200)
Basophils Relative: 0.6 %
Eosinophils Absolute: 130 cells/uL (ref 15–500)
Eosinophils Relative: 2.4 %
HCT: 42 % (ref 35.0–45.0)
Hemoglobin: 14.5 g/dL (ref 11.7–15.5)
Lymphs Abs: 2743 cells/uL (ref 850–3900)
MCH: 30.3 pg (ref 27.0–33.0)
MCHC: 34.5 g/dL (ref 32.0–36.0)
MCV: 87.9 fL (ref 80.0–100.0)
MPV: 11.3 fL (ref 7.5–12.5)
Monocytes Relative: 8.3 %
Neutro Abs: 2047 cells/uL (ref 1500–7800)
Neutrophils Relative %: 37.9 %
Platelets: 202 10*3/uL (ref 140–400)
RBC: 4.78 10*6/uL (ref 3.80–5.10)
RDW: 12.7 % (ref 11.0–15.0)
Total Lymphocyte: 50.8 %
WBC mixed population: 448 cells/uL (ref 200–950)
WBC: 5.4 10*3/uL (ref 3.8–10.8)

## 2017-01-24 LAB — LIPID PANEL
Cholesterol: 190 mg/dL (ref <200)
HDL: 58 mg/dL (ref >50)
LDL Cholesterol (Calc): 106 mg/dL (calc) — ABNORMAL HIGH
Non-HDL Cholesterol (Calc): 132 mg/dL (calc) — ABNORMAL HIGH (ref <130)
Total CHOL/HDL Ratio: 3.3 (calc) (ref <5.0)
Triglycerides: 147 mg/dL (ref <150)

## 2017-01-24 LAB — HEPATIC FUNCTION PANEL
AG Ratio: 2.3 (calc) (ref 1.0–2.5)
ALT: 17 U/L (ref 6–29)
AST: 21 U/L (ref 10–35)
Albumin: 4.9 g/dL (ref 3.6–5.1)
Alkaline phosphatase (APISO): 63 U/L (ref 33–130)
Bilirubin, Direct: 0.2 mg/dL (ref 0.0–0.2)
Globulin: 2.1 g/dL (calc) (ref 1.9–3.7)
Indirect Bilirubin: 1 mg/dL (calc) (ref 0.2–1.2)
Total Bilirubin: 1.2 mg/dL (ref 0.2–1.2)
Total Protein: 7 g/dL (ref 6.1–8.1)

## 2017-01-24 LAB — BASIC METABOLIC PANEL WITH GFR
BUN/Creatinine Ratio: 14 (calc) (ref 6–22)
BUN: 16 mg/dL (ref 7–25)
CO2: 32 mmol/L (ref 20–32)
Calcium: 10.2 mg/dL (ref 8.6–10.4)
Chloride: 101 mmol/L (ref 98–110)
Creat: 1.12 mg/dL — ABNORMAL HIGH (ref 0.60–0.93)
GFR, Est African American: 55 mL/min/{1.73_m2} — ABNORMAL LOW (ref >=60)
GFR, Est Non African American: 48 mL/min/{1.73_m2} — ABNORMAL LOW (ref >=60)
Glucose, Bld: 97 mg/dL (ref 65–99)
Potassium: 4.8 mmol/L (ref 3.5–5.3)
Sodium: 141 mmol/L (ref 135–146)

## 2017-01-24 LAB — MAGNESIUM: Magnesium: 2.2 mg/dL (ref 1.5–2.5)

## 2017-01-24 LAB — TSH: TSH: 2.18 mIU/L (ref 0.40–4.50)

## 2017-01-24 NOTE — Patient Instructions (Addendum)
Get on zantac or pepcid at night for sleep for at least 4 weeks Do heating pad on right cheek twice a day for 7 days If it does not get better can refer to Ear, nose and throat doctor     Bad carbs also include fruit juice, alcohol, and sweet tea. These are empty calories that do not signal to your brain that you are full.   Please remember the good carbs are still carbs which convert into sugar. So please measure them out no more than 1/2-1 cup of rice, oatmeal, pasta, and beans  Veggies are however free foods! Pile them on.   Not all fruit is created equal. Please see the list below, the fruit at the bottom is higher in sugars than the fruit at the top. Please avoid all dried fruits.

## 2017-01-25 NOTE — Progress Notes (Signed)
Pt aware of lab results & voiced understanding of those results.

## 2017-02-27 ENCOUNTER — Other Ambulatory Visit: Payer: Self-pay | Admitting: *Deleted

## 2017-02-27 MED ORDER — AMLODIPINE BESYLATE 5 MG PO TABS: 5.0000 mg | ORAL_TABLET | Freq: Every day | ORAL | 1 refills | Status: DC

## 2017-03-07 ENCOUNTER — Telehealth: Payer: Self-pay

## 2017-03-07 NOTE — Telephone Encounter (Signed)
Pt called to report a recall on her [place], pt states she seen it on TV.   Pt has been instructed to call her pharmacy & find out if this recall is on her meds. The recall is on one specific manufacturer & not all. Pt states she will call & ascertain that information & will call our office back with that information & if it was her med that was recalled, we will write a new rx for her.  Pt voiced understanding & hung up.

## 2017-03-09 ENCOUNTER — Other Ambulatory Visit: Payer: Self-pay | Admitting: *Deleted

## 2017-03-09 MED ORDER — RANITIDINE HCL 300 MG PO TABS: 300.0000 mg | ORAL_TABLET | Freq: Two times a day (BID) | ORAL | 1 refills | Status: DC

## 2017-03-20 DIAGNOSIS — Z1231 Encounter for screening mammogram for malignant neoplasm of breast: Secondary | ICD-10-CM | POA: Diagnosis not present

## 2017-04-05 DIAGNOSIS — K219 Gastro-esophageal reflux disease without esophagitis: Secondary | ICD-10-CM | POA: Diagnosis not present

## 2017-04-05 DIAGNOSIS — L7452 Secondary focal hyperhidrosis: Secondary | ICD-10-CM | POA: Diagnosis not present

## 2017-05-23 DIAGNOSIS — Z86010 Personal history of colonic polyps: Secondary | ICD-10-CM | POA: Diagnosis not present

## 2017-05-23 DIAGNOSIS — Z1211 Encounter for screening for malignant neoplasm of colon: Secondary | ICD-10-CM | POA: Diagnosis not present

## 2017-05-23 LAB — HM COLONOSCOPY

## 2017-06-13 ENCOUNTER — Other Ambulatory Visit: Payer: Self-pay | Admitting: *Deleted

## 2017-06-13 MED ORDER — AMLODIPINE BESYLATE 5 MG PO TABS: 5.0000 mg | ORAL_TABLET | Freq: Every day | ORAL | 0 refills | Status: DC

## 2017-06-20 DIAGNOSIS — H524 Presbyopia: Secondary | ICD-10-CM | POA: Diagnosis not present

## 2017-06-20 DIAGNOSIS — H5203 Hypermetropia, bilateral: Secondary | ICD-10-CM | POA: Diagnosis not present

## 2017-06-20 DIAGNOSIS — H2511 Age-related nuclear cataract, right eye: Secondary | ICD-10-CM | POA: Diagnosis not present

## 2017-06-20 DIAGNOSIS — H2512 Age-related nuclear cataract, left eye: Secondary | ICD-10-CM | POA: Diagnosis not present

## 2017-06-20 DIAGNOSIS — H25012 Cortical age-related cataract, left eye: Secondary | ICD-10-CM | POA: Diagnosis not present

## 2017-07-17 ENCOUNTER — Encounter: Payer: Self-pay | Admitting: Internal Medicine

## 2017-07-17 ENCOUNTER — Ambulatory Visit (INDEPENDENT_AMBULATORY_CARE_PROVIDER_SITE_OTHER): Payer: Medicare Other | Admitting: Internal Medicine

## 2017-07-17 VITALS — BP 126/60 | HR 88 | Temp 97.9°F | Resp 16 | Ht 60.5 in | Wt 157.6 lb

## 2017-07-17 DIAGNOSIS — I10 Essential (primary) hypertension: Secondary | ICD-10-CM

## 2017-07-17 DIAGNOSIS — B351 Tinea unguium: Secondary | ICD-10-CM | POA: Diagnosis not present

## 2017-07-17 DIAGNOSIS — J40 Bronchitis, not specified as acute or chronic: Secondary | ICD-10-CM | POA: Diagnosis not present

## 2017-07-17 DIAGNOSIS — Z79899 Other long term (current) drug therapy: Secondary | ICD-10-CM | POA: Diagnosis not present

## 2017-07-17 DIAGNOSIS — K219 Gastro-esophageal reflux disease without esophagitis: Secondary | ICD-10-CM | POA: Diagnosis not present

## 2017-07-17 DIAGNOSIS — R7309 Other abnormal glucose: Secondary | ICD-10-CM | POA: Diagnosis not present

## 2017-07-17 DIAGNOSIS — E559 Vitamin D deficiency, unspecified: Secondary | ICD-10-CM | POA: Diagnosis not present

## 2017-07-17 DIAGNOSIS — E782 Mixed hyperlipidemia: Secondary | ICD-10-CM

## 2017-07-17 MED ORDER — TERBINAFINE HCL 250 MG PO TABS: ORAL_TABLET | ORAL | 0 refills | Status: DC

## 2017-07-17 MED ORDER — PROMETHAZINE-DM 6.25-15 MG/5ML PO SYRP: ORAL_SOLUTION | ORAL | 1 refills | Status: DC

## 2017-07-17 MED ORDER — AZITHROMYCIN 250 MG PO TABS: ORAL_TABLET | ORAL | 1 refills | Status: DC

## 2017-07-17 MED ORDER — PREDNISONE 20 MG PO TABS: ORAL_TABLET | ORAL | 0 refills | Status: DC

## 2017-07-17 NOTE — Patient Instructions (Signed)

## 2017-07-17 NOTE — Progress Notes (Signed)
This very nice 77 y.o. MBF presents for 3 month follow up with HTN, HLD, Pre-Diabetes and Vitamin D Deficiency. Patient has hx/o GERD controlled on Omeprazole.      Patient also expresses concern re: thickened/discolored toenails. In addition she has a 4-5 day prodrome of head & chest congestion with a thick greenish yellow sputum.      Patient is treated for HTN (2000)  & BP has been controlled at home. Today's BP is at goal - 126/60. Patient has had no complaints of any cardiac type chest pain, palpitations, dyspnea / orthopnea / PND, dizziness, claudication, or dependent edema.     Hyperlipidemia is not controlled with diet. Last Lipids were not at goal: Lab Results  Component Value Date   CHOL 190 01/24/2017   HDL 58 01/24/2017   LDLCALC 106 (H) 01/24/2017   TRIG 147 01/24/2017   CHOLHDL 3.3 01/24/2017      Also, the patient has history of PreDiabetes (A1c 5.8%/2008) and has had no symptoms of reactive hypoglycemia, diabetic polys, paresthesias or visual blurring.  She has gained 12# over the last 6 months. Last A1c was Normal & at goal: Lab Results  Component Value Date   HGBA1C 5.3 06/10/2016      Further, the patient also has history of Vitamin D Deficiency ("36"/2016) and supplements vitamin D without any suspected side-effects. Last vitamin D was low: Lab Results  Component Value Date   VD25OH 64 06/10/2016   Current Outpatient Medications on File Prior to Visit  Medication Sig  . amLODipine (NORVASC) 5 MG tablet Take 1 tablet (5 mg total) by mouth daily.  . Ascorbic Acid (VITAMIN C) 1000 MG tablet Take 1,000 mg by mouth daily.  Marland Kitchen aspirin EC 81 MG tablet Take 81 mg by mouth daily.  Marland Kitchen b complex vitamins tablet Take 1 tablet by mouth daily.  . Cholecalciferol (VITAMIN D) 2000 UNITS tablet Take 4,000 Units by mouth daily.   Marland Kitchen omeprazole (PRILOSEC) 40 MG capsule Take 40 mg by mouth daily.  . vitamin E 200 UNIT capsule Take 200 Units by mouth daily.   No current  facility-administered medications on file prior to visit.    Allergies  Allergen Reactions  . Nexium [Esomeprazole Magnesium] Other (See Comments)    Headache   PMHx:   Past Medical History:  Diagnosis Date  . Early cataracts, bilateral   . Elevated hemoglobin A1c   . Fibroids   . GERD (gastroesophageal reflux disease)   . Hyperlipidemia   . Hypertension   . Osteopenia    Immunization History  Administered Date(s) Administered  . Influenza, High Dose Seasonal PF 01/28/2015, 12/11/2015  . Influenza-Unspecified 01/29/2013, 12/27/2013, 01/25/2017  . PPD Test 02/25/2013  . Pneumococcal Conjugate-13 01/24/2017  . Pneumococcal Polysaccharide-23 02/25/2013  . Td 09/13/2001, 02/20/2012  . Tdap 02/25/2013  . Zoster 10/23/2006   Past Surgical History:  Procedure Laterality Date  . ABDOMINAL HYSTERECTOMY    . APPENDECTOMY    . 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 TONSILLECTOMY AND ADENOIDECTOMY     FHx:    Reviewed / unchanged  SHx:    Reviewed / unchanged  Systems Review:  Constitutional: Denies fever, chills, wt changes, headaches, insomnia, fatigue, night sweats, change in appetite. Eyes: Denies redness, blurred vision, diplopia, discharge, itchy, watery eyes.  ENT: Denies discharge, congestion, post nasal drip, epistaxis, sore throat, earache, hearing loss, dental pain, tinnitus, vertigo, sinus pain, snoring.  CV: Denies chest  pain, palpitations, irregular heartbeat, syncope, dyspnea, diaphoresis, orthopnea, PND, claudication or edema. Respiratory:  Has congestion as above.  Gastrointestinal: Denies dysphagia, odynophagia, heartburn, reflux, water brash, abdominal pain or cramps, nausea, vomiting, bloating, diarrhea, constipation, hematemesis, melena, hematochezia  or hemorrhoids. Genitourinary: Denies dysuria, frequency, urgency, nocturia, hesitancy, discharge, hematuria or flank pain. Musculoskeletal: Denies arthralgias, myalgias, stiffness, jt. swelling, pain,  limping or strain/sprain.  Skin: Denies pruritus, rash, hives, warts, acne, eczema or change in skin lesion(s). Neuro: No weakness, tremor, incoordination, spasms, paresthesia or pain. Psychiatric: Denies confusion, memory loss or sensory loss. Endo: Denies change in weight, skin or hair change.  Heme/Lymph: No excessive bleeding, bruising or enlarged lymph nodes.  Physical Exam  BP 126/60   Pulse 88   Temp 97.9 F (36.6 C)   Resp 16   Ht 5' 0.5" (1.537 m)   Wt 157 lb 9.6 oz (71.5 kg)   BMI 30.27 kg/m   Appears  over nourished, well groomed  and in no distress. Congested cough. No stridor. Eyes: PERRLA, EOMs, conjunctiva no swelling or erythema. Sinuses: No frontal/maxillary tenderness ENT/Mouth: EAC's clear, TM's nl w/o erythema, bulging. Nares clear w/o erythema, swelling, exudates. Oropharynx clear without erythema or exudates. Oral hygiene is good. Tongue normal, non obstructing. Hearing intact.  Neck: Supple. Thyroid not palpable. Car 2+/2+ without bruits, nodes or JVD. Chest: Bilat scattered coarse rales &  Rhonchi w/o wheezing or stridor.  Cor: Heart sounds normal w/ regular rate and rhythm without sig. murmurs, gallops, clicks or rubs. Peripheral pulses normal and equal  without edema.  Abdomen: Soft, rotund & bowel sounds normal. Non-tender w/o guarding, rebound, hernias, masses or organomegaly.  Lymphatics: Unremarkable.  Musculoskeletal: Full ROM all peripheral extremities, joint stability, 5/5 strength and normal gait.  Skin: Warm, dry without exposed rashes, lesions or ecchymosis apparent. Thickened dystrophic chalky toenails.  Neuro: Cranial nerves intact, reflexes equal bilaterally. Sensory-motor testing grossly intact. Tendon reflexes grossly intact.  Pysch: Alert & oriented x 3.  Insight and judgement nl & appropriate. No ideations.  Assessment and Plan:  1. Essential hypertension  - Continue medication, monitor blood pressure at home.  - Continue DASH diet.   Reminder to go to the ER if any CP,  SOB, nausea, dizziness, severe HA, changes vision/speech.  - CBC with Differential/Platelet - BASIC METABOLIC PANEL WITH GFR - Magnesium - TSH  2. Hyperlipidemia, mixed  - Continue diet/meds, exercise,& lifestyle modifications.  - Continue monitor periodic cholesterol/liver & renal functions   - Hepatic function panel - Lipid panel - TSH  3. Abnormal glucose  - Hemoglobin A1c - Insulin, random  4. Vitamin D deficiency  - Continue diet, exercise, lifestyle modifications.  - Monitor appropriate labs. - Continue supplementation.  - VITAMIN D 25 Hydroxy  5. PreDiabetes  - Hemoglobin A1c - Insulin, random  6. Gastroesophageal reflux disease, esophagitis presence not specified  - CBC with Differential/Platelet  7. Bronchitis  - predniSONE (DELTASONE) 20 MG tablet; 1 tab 3 x day for 2 days, then 1 tab 2 x day for 2 days, then 1 tab 1 x day for 3 days  Dispense: 13 tablet; Refill: 0  - azithromycin (ZITHROMAX) 250 MG tablet; Take 2 tablets (500 mg) on  Day 1,  followed by 1 tablet (250 mg) once daily on Days 2 through 5.  Dispense: 6 each; Refill: 1  - promethazine-dextromethorphan (PROMETHAZINE-DM) 6.25-15 MG/5ML syrup; Take 1 to 2 tsp enery 4 hours if needed for cough  Dispense: 360 mL; Refill: 1  8.  Onychomycosis of toenail  - terbinafine (LAMISIL) 250 MG tablet; Take 1 tablet daily for Toenail Fungus  Dispense: 90 tablet; Refill: 0 - [place] - 6 weeks to check HFP  9. Medication management  - CBC with Differential/Platelet - BASIC METABOLIC PANEL WITH GFR - Hepatic function panel - Magnesium - Lipid panel - TSH - Hemoglobin A1c - Insulin, random - VITAMIN D 25 Hydroxyl           Discussed  regular exercise, BP monitoring, weight control to achieve/maintain BMI less than 25 and discussed med and SE's. Recommended labs to assess and monitor clinical status with further disposition pending results of labs. Over 30 minutes of  exam, counseling, chart review was performed.

## 2017-07-18 LAB — LIPID PANEL
Cholesterol: 145 mg/dL (ref <200)
HDL: 45 mg/dL — ABNORMAL LOW (ref >50)
LDL Cholesterol (Calc): 81 mg/dL (calc)
Non-HDL Cholesterol (Calc): 100 mg/dL (calc) (ref <130)
Total CHOL/HDL Ratio: 3.2 (calc) (ref <5.0)
Triglycerides: 97 mg/dL (ref <150)

## 2017-07-18 LAB — CBC WITH DIFFERENTIAL/PLATELET
Basophils Absolute: 35 cells/uL (ref 0–200)
Basophils Relative: 0.3 %
Eosinophils Absolute: 118 cells/uL (ref 15–500)
Eosinophils Relative: 1 %
HCT: 35.8 % (ref 35.0–45.0)
Hemoglobin: 12.2 g/dL (ref 11.7–15.5)
Lymphs Abs: 3387 cells/uL (ref 850–3900)
MCH: 29.8 pg (ref 27.0–33.0)
MCHC: 34.1 g/dL (ref 32.0–36.0)
MCV: 87.3 fL (ref 80.0–100.0)
MPV: 12 fL (ref 7.5–12.5)
Monocytes Relative: 11.4 %
Neutro Abs: 6915 cells/uL (ref 1500–7800)
Neutrophils Relative %: 58.6 %
Platelets: 173 10*3/uL (ref 140–400)
RBC: 4.1 10*6/uL (ref 3.80–5.10)
RDW: 12.4 % (ref 11.0–15.0)
Total Lymphocyte: 28.7 %
WBC mixed population: 1345 cells/uL — ABNORMAL HIGH (ref 200–950)
WBC: 11.8 10*3/uL — ABNORMAL HIGH (ref 3.8–10.8)

## 2017-07-18 LAB — BASIC METABOLIC PANEL WITH GFR
BUN/Creatinine Ratio: 13 (calc) (ref 6–22)
BUN: 16 mg/dL (ref 7–25)
CO2: 29 mmol/L (ref 20–32)
Calcium: 9.8 mg/dL (ref 8.6–10.4)
Chloride: 102 mmol/L (ref 98–110)
Creat: 1.22 mg/dL — ABNORMAL HIGH (ref 0.60–0.93)
GFR, Est African American: 49 mL/min/{1.73_m2} — ABNORMAL LOW (ref >=60)
GFR, Est Non African American: 43 mL/min/{1.73_m2} — ABNORMAL LOW (ref >=60)
Glucose, Bld: 107 mg/dL — ABNORMAL HIGH (ref 65–99)
Potassium: 4.3 mmol/L (ref 3.5–5.3)
Sodium: 141 mmol/L (ref 135–146)

## 2017-07-18 LAB — HEMOGLOBIN A1C
Hgb A1c MFr Bld: 5.7 %{Hb} — ABNORMAL HIGH (ref <5.7)
Mean Plasma Glucose: 117 (calc)
eAG (mmol/L): 6.5 (calc)

## 2017-07-18 LAB — HEPATIC FUNCTION PANEL
AG Ratio: 1.8 (calc) (ref 1.0–2.5)
ALT: 24 U/L (ref 6–29)
AST: 24 U/L (ref 10–35)
Albumin: 4.5 g/dL (ref 3.6–5.1)
Alkaline phosphatase (APISO): 71 U/L (ref 33–130)
Bilirubin, Direct: 0.3 mg/dL — ABNORMAL HIGH (ref 0.0–0.2)
Globulin: 2.5 g/dL (calc) (ref 1.9–3.7)
Indirect Bilirubin: 1.2 mg/dL (calc) (ref 0.2–1.2)
Total Bilirubin: 1.5 mg/dL — ABNORMAL HIGH (ref 0.2–1.2)
Total Protein: 7 g/dL (ref 6.1–8.1)

## 2017-07-18 LAB — TSH: TSH: 2.1 m[IU]/L (ref 0.40–4.50)

## 2017-07-18 LAB — MAGNESIUM: Magnesium: 2.3 mg/dL (ref 1.5–2.5)

## 2017-07-18 LAB — VITAMIN D 25 HYDROXY (VIT D DEFICIENCY, FRACTURES): Vit D, 25-Hydroxy: 49 ng/mL (ref 30–100)

## 2017-07-18 LAB — INSULIN, RANDOM: Insulin: 16.1 u[IU]/mL (ref 2.0–19.6)

## 2017-07-26 ENCOUNTER — Ambulatory Visit: Payer: Self-pay | Admitting: Internal Medicine

## 2017-08-01 ENCOUNTER — Other Ambulatory Visit: Payer: Self-pay | Admitting: Internal Medicine

## 2017-08-02 DIAGNOSIS — H2511 Age-related nuclear cataract, right eye: Secondary | ICD-10-CM | POA: Diagnosis not present

## 2017-08-02 DIAGNOSIS — H2512 Age-related nuclear cataract, left eye: Secondary | ICD-10-CM | POA: Diagnosis not present

## 2017-08-02 DIAGNOSIS — H25012 Cortical age-related cataract, left eye: Secondary | ICD-10-CM | POA: Diagnosis not present

## 2017-08-02 DIAGNOSIS — H25011 Cortical age-related cataract, right eye: Secondary | ICD-10-CM | POA: Diagnosis not present

## 2017-08-16 DIAGNOSIS — H2512 Age-related nuclear cataract, left eye: Secondary | ICD-10-CM | POA: Diagnosis not present

## 2017-08-16 DIAGNOSIS — H25012 Cortical age-related cataract, left eye: Secondary | ICD-10-CM | POA: Diagnosis not present

## 2017-08-16 DIAGNOSIS — H2511 Age-related nuclear cataract, right eye: Secondary | ICD-10-CM | POA: Diagnosis not present

## 2017-08-29 ENCOUNTER — Ambulatory Visit (INDEPENDENT_AMBULATORY_CARE_PROVIDER_SITE_OTHER): Payer: Medicare Other | Admitting: *Deleted

## 2017-08-29 DIAGNOSIS — Z79899 Other long term (current) drug therapy: Secondary | ICD-10-CM | POA: Diagnosis not present

## 2017-08-29 DIAGNOSIS — B351 Tinea unguium: Secondary | ICD-10-CM

## 2017-08-29 LAB — HEPATIC FUNCTION PANEL
AG Ratio: 2.1 (calc) (ref 1.0–2.5)
ALT: 16 U/L (ref 6–29)
AST: 19 U/L (ref 10–35)
Albumin: 4.6 g/dL (ref 3.6–5.1)
Alkaline phosphatase (APISO): 63 U/L (ref 33–130)
Bilirubin, Direct: 0.2 mg/dL (ref 0.0–0.2)
Globulin: 2.2 g/dL (calc) (ref 1.9–3.7)
Indirect Bilirubin: 0.7 mg/dL (calc) (ref 0.2–1.2)
Total Bilirubin: 0.9 mg/dL (ref 0.2–1.2)
Total Protein: 6.8 g/dL (ref 6.1–8.1)

## 2017-08-29 NOTE — Progress Notes (Signed)
Patient is here for a HFP due to starting Terbinafine for toe nail fungus. Patient states her legs have been hurting and asked if the medication could be the cause.  Per Dr Melford Aase, the medication should be the cause and advised the patient that the OTC paint on products do not work as well as the oral RX.

## 2017-09-02 HISTORY — PX: CATARACT EXTRACTION, BILATERAL: SHX1313

## 2017-09-11 ENCOUNTER — Other Ambulatory Visit: Payer: Self-pay | Admitting: *Deleted

## 2017-09-11 MED ORDER — AMLODIPINE BESYLATE 5 MG PO TABS: 5.0000 mg | ORAL_TABLET | Freq: Every day | ORAL | 1 refills | Status: DC

## 2017-10-04 ENCOUNTER — Encounter: Payer: Self-pay | Admitting: Physician Assistant

## 2017-10-30 ENCOUNTER — Encounter: Payer: Self-pay | Admitting: Physician Assistant

## 2017-12-06 NOTE — Progress Notes (Signed)
MEDICARE ANNUAL WELLNESS VISIT AND FOLLOW UP  Assessment:    Essential hypertension - continue medications, DASH diet, exercise and monitor at home. Call if greater than 130/80.  -     CBC with Differential/Platelet -     BASIC METABOLIC PANEL WITH GFR -     Hepatic function panel -     TSH  CKD (chronic kidney disease) stage 3, GFR 30-59 ml/min (HCC) -     BASIC METABOLIC PANEL WITH GFR - Increase fluids, avoid NSAIDS, monitor sugars, will monitor  Hyperlipidemia, unspecified hyperlipidemia type -     Lipid panel -continue medications, check lipids, decrease fatty foods, increase activity.   Medication management -     Magnesium  PreDiabetes Discussed disease progression and risks Discussed diet/exercise, weight management and risk modification  Vitamin D deficiency Continue supplement  Encounter for Medicare annual wellness exam 77 year  Osteopenia, unspecified location Osteopenia- get dexa with MGM, continue Vit D and Ca, weight bearing exercises  Gastroesophageal reflux disease, esophagitis presence not specified Continue PPI  BMI 30 - follow up 4 months for progress monitoring - increase veggies, decrease carbs - long discussion about weight loss, diet, and exercise   Over 30 minutes of exam, counseling, chart review, and critical decision making was performed No future appointments.  Plan:   During the course of the visit the patient was educated and counseled about appropriate screening and preventive services including:    Pneumococcal vaccine   Influenza vaccine  Td vaccine  Prevnar 13  Screening electrocardiogram  Screening mammography  Bone densitometry screening  Colorectal cancer screening  Diabetes screening  Glaucoma screening  Nutrition counseling   Advanced directives: given info/requested copies  Subjective:   Jacqueline Hancock is a 77 y.o. AA female who presents for Medicare Annual Wellness Visit and 3 month follow up on  hypertension, prediabetes, hyperlipidemia, vitamin D def.   She states she feels that her stomach is getting larger, she feels bloated, she is on prilosec 40mg  that helps with her heart burn, she has BM daily, sometimes loose stool, no diarrhea, no cramping, some urgency occ. She had CT AB 2009 that was normal, colonoscopy 05/2017.   BMI is Body mass index is 30.73 kg/m., she is working on diet and exercise. Going to start [facility] Monday.  Wt Readings from Last 3 Encounters:  12/08/17 160 lb (72.6 kg)  07/17/17 157 lb 9.6 oz (71.5 kg)  01/24/17 152 lb 9.6 oz (69.2 kg)    Her blood pressure has been controlled at home, today their BP is BP: 120/70 She does workout, does it in her house. She denies chest pain, shortness of breath, dizziness.  She is not on cholesterol medication and denies myalgias. Her cholesterol is at goal. The cholesterol last visit was:   Lab Results  Component Value Date   CHOL 145 07/17/2017   HDL 45 (L) 07/17/2017   LDLCALC 81 07/17/2017   TRIG 97 07/17/2017   CHOLHDL 3.2 07/17/2017   She has been working on diet and exercise for prediabetes, and denies paresthesia of the feet, polydipsia, polyuria and visual disturbances. Last A1C in the office was:  Lab Results  Component Value Date   HGBA1C 5.7 (H) 07/17/2017   Lab Results  Component Value Date   GFRAA 49 (L) 07/17/2017   Patient is on Vitamin D supplement. Lab Results  Component Value Date   VD25OH 49 07/17/2017      Medication Review Current Outpatient Medications on File Prior  to Visit  Medication Sig Dispense Refill  . amLODipine (NORVASC) 5 MG tablet Take 1 tablet (5 mg total) by mouth daily. 90 tablet 1  . Ascorbic Acid (VITAMIN C) 1000 MG tablet Take 1,000 mg by mouth daily.    Marland Kitchen aspirin EC 81 MG tablet Take 81 mg by mouth daily.    Marland Kitchen b complex vitamins tablet Take 1 tablet by mouth daily.    . Cholecalciferol (VITAMIN D) 2000 UNITS tablet Take 4,000 Units by mouth daily.     Marland Kitchen omeprazole  (PRILOSEC) 40 MG capsule Take 40 mg by mouth daily.    . promethazine-dextromethorphan (PROMETHAZINE-DM) 6.25-15 MG/5ML syrup Take 1 to 2 tsp enery 4 hours if needed for cough 360 mL 1  . terbinafine (LAMISIL) 250 MG tablet Take 1 tablet daily for Toenail Fungus 90 tablet 0  . vitamin E 200 UNIT capsule Take 200 Units by mouth daily.     No current facility-administered medications on file prior to visit.     Current Problems (verified) Patient Active Problem List   Diagnosis Date Noted  . CKD (chronic kidney disease) stage 3, GFR 30-59 ml/min (HCC) 09/10/2015  . Vitamin D deficiency 06/20/2013  . Medication management 06/20/2013  . Hypertension   . Hyperlipidemia   . PreDiabetes   . GERD (gastroesophageal reflux disease)   . Osteopenia     Screening Tests Immunization History  Administered Date(s) Administered  . Influenza, High Dose Seasonal PF 01/28/2015, 12/11/2015  . Influenza-Unspecified 01/29/2013, 12/27/2013, 01/25/2017  . PPD Test 02/25/2013  . Pneumococcal Conjugate-13 01/24/2017  . Pneumococcal Polysaccharide-23 02/25/2013  . Td 09/13/2001, 02/20/2012  . Tdap 02/25/2013  . Zoster 10/23/2006   Preventative care: Last colonoscopy:  2019 Last mammogram: 03/2016 Last pap smear/pelvic exam: 2015 at GYN q 2 years DEXA: 2014 at GYN stable osteopenia CXR 2012  Prior vaccinations: TD or Tdap: 2014  Influenza:  2018 Pneumococcal: 2014 Prevnar13: 2018 Shingles/Zostavax: 2008  Names of Other Physician/Practitioners you currently use: 1. [facility]- here for primary care 2. Dr. Gershon Crane, eye doctor, last visit May 2018 3. Has partial, dentist, last visit 2 years Patient Care Team: Unk Pinto, MD as PCP - General (Internal Medicine) Jodi Marble, MD as Consulting Physician (Otolaryngology) Richmond Campbell, MD as Consulting Physician (Gastroenterology) Leo Grosser Seymour Bars, MD as Consulting Physician (Obstetrics and  Gynecology) Rutherford Guys, MD as Consulting Physician (Ophthalmology)  Allergies Allergies  Allergen Reactions  . Nexium [Esomeprazole Magnesium] Other (See Comments)    Headache    SURGICAL HISTORY She  has a past surgical history that includes Parotid gland tumor excision (2012); Abdominal hysterectomy; Appendectomy; and Tonsillectomy and adenoidectomy. FAMILY HISTORY Her family history includes Diabetes in her brother and sister; Heart disease in her brother; Hypertension in her father; Leukemia in her mother. SOCIAL HISTORY She  reports that she has never smoked. She has never used smokeless tobacco. She reports that she does not drink alcohol.  MEDICARE WELLNESS OBJECTIVES: Physical activity: Current Exercise Habits: The patient does not participate in regular exercise at present(wants to start gym monday) Cardiac risk factors: Cardiac Risk Factors include: advanced age (>21men, >38 women);dyslipidemia;hypertension;sedentary lifestyle;obesity (BMI >30kg/m2) Depression/mood screen:   Depression screen [facility] 2/9 12/08/2017  Decreased Interest 0  Down, Depressed, Hopeless 0  PHQ - 2 Score 0    ADLs:  In your present state of health, do you have any difficulty performing the following activities: 12/08/2017 07/17/2017  Hearing? N N  Vision? N N  Difficulty  concentrating or making decisions? N N  Walking or climbing stairs? N N  Dressing or bathing? N N  Doing errands, shopping? N N  Some recent data might be hidden     Cognitive Testing  Alert? Yes  Normal Appearance?Yes  Oriented to person? Yes  Place? Yes   Time? Yes  Recall of three objects?  Yes  Can perform simple calculations? Yes  Displays appropriate judgment?Yes  Can read the correct time from a watch face?Yes  EOL planning: Does Patient Have a Medical Advance Directive?: Yes Type of Advance Directive: Healthcare Power of Attorney, Living will Copy of [place] in Chart?: No - copy requested    Objective:   Today's Vitals   12/08/17 0919  BP: 120/70  Pulse: 73  Resp: 14  Temp: 98.6 F (37 C)  SpO2: 99%  Weight: 160 lb (72.6 kg)  Height: 5' 0.5" (1.537 m)   Body mass index is 30.73 kg/m.  General appearance: alert, no distress, WD/WN,  female HEENT: normocephalic, sclerae anicteric, TMs pearly, nares patent, no discharge or erythema, pharynx normal Oral cavity: MMM, no lesions Neck: supple, no lymphadenopathy, no thyromegaly, no masses Heart: RRR, normal S1, S2, no murmurs Lungs: CTA bilaterally, no wheezes, rhonchi, or rales Abdomen: +bs, soft, + epigastric tender, non distended, no masses, no hepatomegaly, no splenomegaly Musculoskeletal: nontender, no swelling, no obvious deformity Extremities: no edema, no cyanosis, no clubbing Pulses: 2+ symmetric, upper and lower extremities, normal cap refill Neurological: alert, oriented x 3, CN2-12 intact, strength normal upper extremities and lower extremities, sensation normal throughout, DTRs 2+ throughout, no cerebellar signs, gait normal Psychiatric: normal affect, behavior normal, pleasant  Skin: seb cyst right post auricular, no lymphadenopathy Breast: defer Gyn: defer Rectal: defer   Medicare Attestation I have personally reviewed: The patient's medical and social history Their use of alcohol, tobacco or illicit drugs Their current medications and supplements The patient's functional ability including ADLs,fall risks, home safety risks, cognitive, and hearing and visual impairment Diet and physical activities Evidence for depression or mood disorders  The patient's weight, height, BMI, and visual acuity have been recorded in the chart.  I have made referrals, counseling, and provided education to the patient based on review of the above and I have provided the patient with a written personalized care plan for preventive services.     Vicie Mutters, PA-C   12/08/2017

## 2017-12-08 ENCOUNTER — Ambulatory Visit (INDEPENDENT_AMBULATORY_CARE_PROVIDER_SITE_OTHER): Payer: Medicare Other | Admitting: Physician Assistant

## 2017-12-08 ENCOUNTER — Encounter: Payer: Self-pay | Admitting: Physician Assistant

## 2017-12-08 VITALS — BP 120/70 | HR 73 | Temp 98.6°F | Resp 14 | Ht 60.5 in | Wt 160.0 lb

## 2017-12-08 DIAGNOSIS — N183 Chronic kidney disease, stage 3 unspecified: Secondary | ICD-10-CM

## 2017-12-08 DIAGNOSIS — Z6830 Body mass index (BMI) 30.0-30.9, adult: Secondary | ICD-10-CM

## 2017-12-08 DIAGNOSIS — E785 Hyperlipidemia, unspecified: Secondary | ICD-10-CM

## 2017-12-08 DIAGNOSIS — E559 Vitamin D deficiency, unspecified: Secondary | ICD-10-CM | POA: Diagnosis not present

## 2017-12-08 DIAGNOSIS — R7309 Other abnormal glucose: Secondary | ICD-10-CM | POA: Diagnosis not present

## 2017-12-08 DIAGNOSIS — M8580 Other specified disorders of bone density and structure, unspecified site: Secondary | ICD-10-CM

## 2017-12-08 DIAGNOSIS — K219 Gastro-esophageal reflux disease without esophagitis: Secondary | ICD-10-CM

## 2017-12-08 DIAGNOSIS — Z79899 Other long term (current) drug therapy: Secondary | ICD-10-CM

## 2017-12-08 DIAGNOSIS — Z0000 Encounter for general adult medical examination without abnormal findings: Secondary | ICD-10-CM

## 2017-12-08 DIAGNOSIS — Z0001 Encounter for general adult medical examination with abnormal findings: Secondary | ICD-10-CM | POA: Diagnosis not present

## 2017-12-08 DIAGNOSIS — R6889 Other general symptoms and signs: Secondary | ICD-10-CM

## 2017-12-08 DIAGNOSIS — I10 Essential (primary) hypertension: Secondary | ICD-10-CM

## 2017-12-08 NOTE — Patient Instructions (Addendum)
Being dehydrated can hurt your kidneys, cause fatigue, headaches, muscle aches, joint pain, and dry skin/nails so please increase your fluids.   Drink 80-100 oz a day of water, measure it out! Eat 3 meals a day, have to do breakfast, eat protein- hard boiled eggs, protein bar like nature [place] protein bar, greek yogurt like oikos triple zero, chobani 100, or light n fit greek  Can check out plantnanny app on your phone to help you keep track of your water   Check out  Mini habits for weight loss book  2 apps for tracking food is myfitness pal  loseit OR can take picture of your food  HOW TO SCHEDULE A MAMMOGRAM- due Dec  Lea  7 a.m.-6:30 p.m., Monday 7 a.m.-5 p.m., Tuesday-Friday Schedule an appointment by calling [phone].  For the bloating try an over the counter probiotic once a day for 1 month and see if this helps.    FIBER SUPPLEMENT  Benefiber or Citracel is good for constipation/diarrhea/irritable bowel syndrome, it helps with weight loss and can help lower your bad cholesterol. Please do 1 TBSP in the morning in water, coffee, or tea. It can take up to a month before you can see a difference with your bowel movements. It is cheapest from [employer], sam's, [employer].      When it comes to diets, agreement about the perfect plan isn't easy to find, even among the experts. Experts at the [employer] developed an idea known as the Healthy Eating Plate. Just imagine a plate divided into logical, healthy portions.  The emphasis is on diet quality:  Load up on vegetables and fruits - one-half of your plate: Aim for color and variety, and remember that potatoes don't count.  Go for whole grains - one-quarter of your plate: Whole wheat, barley, wheat berries, quinoa, oats, brown rice, and foods made with them. If you want pasta, go with whole wheat pasta.  Protein power - one-quarter of your plate: Fish, chicken,  beans, and nuts are all healthy, versatile protein sources. Limit red meat.  The diet, however, does go beyond the plate, offering a few other suggestions.  Use healthy plant oils, such as olive, canola, soy, corn, sunflower and peanut. Check the labels, and avoid partially hydrogenated oil, which have unhealthy trans fats.  If you're thirsty, drink water. Coffee and tea are good in moderation, but skip sugary drinks and limit milk and dairy products to one or two daily servings.  The type of carbohydrate in the diet is more important than the amount. Some sources of carbohydrates, such as vegetables, fruits, whole grains, and beans-are healthier than others.  Finally, stay active.

## 2017-12-09 LAB — TSH: TSH: 2 mIU/L (ref 0.40–4.50)

## 2017-12-09 LAB — COMPLETE METABOLIC PANEL WITH GFR
AG Ratio: 2.1 (calc) (ref 1.0–2.5)
ALT: 18 U/L (ref 6–29)
AST: 20 U/L (ref 10–35)
Albumin: 4.9 g/dL (ref 3.6–5.1)
Alkaline phosphatase (APISO): 73 U/L (ref 33–130)
BUN/Creatinine Ratio: 16 (calc) (ref 6–22)
BUN: 22 mg/dL (ref 7–25)
CO2: 29 mmol/L (ref 20–32)
Calcium: 10.6 mg/dL — ABNORMAL HIGH (ref 8.6–10.4)
Chloride: 103 mmol/L (ref 98–110)
Creat: 1.35 mg/dL — ABNORMAL HIGH (ref 0.60–0.93)
GFR, Est African American: 44 mL/min/{1.73_m2} — ABNORMAL LOW (ref >=60)
GFR, Est Non African American: 38 mL/min/{1.73_m2} — ABNORMAL LOW (ref >=60)
Globulin: 2.3 g/dL (calc) (ref 1.9–3.7)
Glucose, Bld: 99 mg/dL (ref 65–99)
Potassium: 5.1 mmol/L (ref 3.5–5.3)
Sodium: 142 mmol/L (ref 135–146)
Total Bilirubin: 0.7 mg/dL (ref 0.2–1.2)
Total Protein: 7.2 g/dL (ref 6.1–8.1)

## 2017-12-09 LAB — HEMOGLOBIN A1C
Hgb A1c MFr Bld: 5.7 % of total Hgb — ABNORMAL HIGH (ref <5.7)
Mean Plasma Glucose: 117 (calc)
eAG (mmol/L): 6.5 (calc)

## 2017-12-09 LAB — CBC WITH DIFFERENTIAL/PLATELET
Basophils Absolute: 20 cells/uL (ref 0–200)
Basophils Relative: 0.4 %
Eosinophils Absolute: 163 cells/uL (ref 15–500)
Eosinophils Relative: 3.2 %
HCT: 43.4 % (ref 35.0–45.0)
Hemoglobin: 14.9 g/dL (ref 11.7–15.5)
Lymphs Abs: 2310 cells/uL (ref 850–3900)
MCH: 30.4 pg (ref 27.0–33.0)
MCHC: 34.3 g/dL (ref 32.0–36.0)
MCV: 88.6 fL (ref 80.0–100.0)
MPV: 12.6 fL — ABNORMAL HIGH (ref 7.5–12.5)
Monocytes Relative: 9.9 %
Neutro Abs: 2101 cells/uL (ref 1500–7800)
Neutrophils Relative %: 41.2 %
Platelets: 140 10*3/uL (ref 140–400)
RBC: 4.9 10*6/uL (ref 3.80–5.10)
RDW: 12.5 % (ref 11.0–15.0)
Total Lymphocyte: 45.3 %
WBC mixed population: 505 cells/uL (ref 200–950)
WBC: 5.1 10*3/uL (ref 3.8–10.8)

## 2017-12-09 LAB — LIPID PANEL
Cholesterol: 164 mg/dL (ref <200)
HDL: 43 mg/dL — ABNORMAL LOW (ref >50)
LDL Cholesterol (Calc): 92 mg/dL (calc)
Non-HDL Cholesterol (Calc): 121 mg/dL (calc) (ref <130)
Total CHOL/HDL Ratio: 3.8 (calc) (ref <5.0)
Triglycerides: 203 mg/dL — ABNORMAL HIGH (ref <150)

## 2017-12-09 LAB — MAGNESIUM: Magnesium: 2.2 mg/dL (ref 1.5–2.5)

## 2017-12-10 NOTE — Addendum Note (Signed)
Addended by: Vicie Mutters R on: 12/10/2017 05:58 AM   Modules accepted: Orders

## 2017-12-12 ENCOUNTER — Other Ambulatory Visit: Payer: Self-pay | Admitting: Internal Medicine

## 2018-01-02 DIAGNOSIS — Z961 Presence of intraocular lens: Secondary | ICD-10-CM | POA: Diagnosis not present

## 2018-01-04 ENCOUNTER — Other Ambulatory Visit: Payer: Self-pay | Admitting: Physician Assistant

## 2018-01-04 DIAGNOSIS — Z1231 Encounter for screening mammogram for malignant neoplasm of breast: Secondary | ICD-10-CM

## 2018-01-09 ENCOUNTER — Ambulatory Visit (INDEPENDENT_AMBULATORY_CARE_PROVIDER_SITE_OTHER): Payer: Medicare Other

## 2018-01-09 DIAGNOSIS — N183 Chronic kidney disease, stage 3 unspecified: Secondary | ICD-10-CM

## 2018-01-09 NOTE — Progress Notes (Signed)
Pt presents for bld lab work which those labs have been in [facility]. Pt reports no new meds or concerns at this time.

## 2018-01-10 LAB — BASIC METABOLIC PANEL WITH GFR
BUN/Creatinine Ratio: 16 (calc) (ref 6–22)
BUN: 21 mg/dL (ref 7–25)
CO2: 30 mmol/L (ref 20–32)
Calcium: 10 mg/dL (ref 8.6–10.4)
Chloride: 103 mmol/L (ref 98–110)
Creat: 1.29 mg/dL — ABNORMAL HIGH (ref 0.60–0.93)
GFR, Est African American: 46 mL/min/{1.73_m2} — ABNORMAL LOW (ref >=60)
GFR, Est Non African American: 40 mL/min/{1.73_m2} — ABNORMAL LOW (ref >=60)
Glucose, Bld: 98 mg/dL (ref 65–99)
Potassium: 5 mmol/L (ref 3.5–5.3)
Sodium: 142 mmol/L (ref 135–146)

## 2018-01-10 LAB — PTH, INTACT AND CALCIUM
Calcium: 10 mg/dL (ref 8.6–10.4)
PTH: 55 pg/mL (ref 14–64)

## 2018-03-05 ENCOUNTER — Ambulatory Visit
Admission: RE | Admit: 2018-03-05 | Discharge: 2018-03-05 | Disposition: A | Discharge status: Home / Self Care | Payer: Medicare Other | Source: Ambulatory Visit | Attending: Physician Assistant | Admitting: Physician Assistant

## 2018-03-05 DIAGNOSIS — Z13820 Encounter for screening for osteoporosis: Secondary | ICD-10-CM | POA: Diagnosis not present

## 2018-03-05 DIAGNOSIS — M8580 Other specified disorders of bone density and structure, unspecified site: Secondary | ICD-10-CM

## 2018-03-05 DIAGNOSIS — Z780 Asymptomatic menopausal state: Secondary | ICD-10-CM | POA: Diagnosis not present

## 2018-03-16 ENCOUNTER — Other Ambulatory Visit: Payer: Self-pay

## 2018-03-16 MED ORDER — AMLODIPINE BESYLATE 5 MG PO TABS: 5.0000 mg | ORAL_TABLET | Freq: Every day | ORAL | 1 refills | Status: DC

## 2018-04-12 NOTE — Progress Notes (Signed)
MEDICARE ANNUAL WELLNESS VISIT AND FOLLOW UP  Assessment:    Essential hypertension - continue medications, DASH diet, exercise and monitor at home. Call if greater than 130/80.  -     CBC with Differential/Platelet -     BASIC METABOLIC PANEL WITH GFR -     Hepatic function panel -     TSH  CKD (chronic kidney disease) stage 3, GFR 30-59 ml/min (HCC) -     BASIC METABOLIC PANEL WITH GFR - Increase fluids, avoid NSAIDS, monitor sugars, will monitor  Hyperlipidemia, unspecified hyperlipidemia type -     Lipid panel -continue medications, check lipids, decrease fatty foods, increase activity.   Medication management -     Magnesium  PreDiabetes Discussed disease progression and risks Discussed diet/exercise, weight management and risk modification  Vitamin D deficiency Continue supplement  Encounter for Medicare annual wellness exam 1 year Get MGM  Osteopenia, unspecified location  continue Vit D and Ca, weight bearing exercises  Gastroesophageal reflux disease, esophagitis presence not specified Continue PPI  BMI 31 - follow up 6 months for progress monitoring - increase veggies, decrease carbs - long discussion about weight loss, diet, and exercise   Over 30 minutes of exam, counseling, chart review, and critical decision making was performed Future Appointments  Date Time Provider [place]  12/14/2018 10:45 AM Vicie Mutters, PA-C [facility] None  04/23/2019 10:00 AM Vicie Mutters, PA-C [facility] None    Plan:   During the course of the visit the patient was educated and counseled about appropriate screening and preventive services including:    Pneumococcal vaccine   Influenza vaccine  Td vaccine  Prevnar 13  Screening electrocardiogram  Screening mammography  Bone densitometry screening  Colorectal cancer screening  Diabetes screening  Glaucoma screening  Nutrition counseling   Advanced directives: given info/requested  copies  Subjective:   Jacqueline Hancock is a 78 y.o. AA female who presents for Medicare Annual Wellness Visit and 3 month follow up on hypertension, prediabetes, hyperlipidemia, vitamin D def.   BMI is Body mass index is 31.05 kg/m., she is working on diet and exercise. Wt Readings from Last 3 Encounters:  04/13/18 159 lb (72.1 kg)  12/08/17 160 lb (72.6 kg)  07/17/17 157 lb 9.6 oz (71.5 kg)    Her blood pressure has been controlled at home, today their BP is BP: 140/84 She does workout, does it in her house. She denies chest pain, shortness of breath, dizziness.  She is not on cholesterol medication and denies myalgias. Her cholesterol is at goal. The cholesterol last visit was:   Lab Results  Component Value Date   CHOL 164 12/08/2017   HDL 43 (L) 12/08/2017   LDLCALC 92 12/08/2017   TRIG 203 (H) 12/08/2017   CHOLHDL 3.8 12/08/2017   She has been working on diet and exercise for prediabetes, and denies paresthesia of the feet, polydipsia, polyuria and visual disturbances. Last A1C in the office was:  Lab Results  Component Value Date   HGBA1C 5.7 (H) 12/08/2017   Lab Results  Component Value Date   GFRAA 46 (L) 01/09/2018   Patient is on Vitamin D supplement. Lab Results  Component Value Date   VD25OH 49 07/17/2017      Medication Review Current Outpatient Medications on File Prior to Visit  Medication Sig Dispense Refill  . amLODipine (NORVASC) 5 MG tablet Take 1 tablet (5 mg total) by mouth daily. 90 tablet 1  . Ascorbic Acid (VITAMIN C) 1000 MG  tablet Take 1,000 mg by mouth daily.    Marland Kitchen aspirin EC 81 MG tablet Take 81 mg by mouth daily.    Marland Kitchen b complex vitamins tablet Take 1 tablet by mouth daily.    . Cholecalciferol (VITAMIN D) 2000 UNITS tablet Take 4,000 Units by mouth daily.      No current facility-administered medications on file prior to visit.     Current Problems (verified) Patient Active Problem List   Diagnosis Date Noted  . CKD (chronic kidney  disease) stage 3, GFR 30-59 ml/min (HCC) 09/10/2015  . Vitamin D deficiency 06/20/2013  . Medication management 06/20/2013  . Hypertension   . Hyperlipidemia   . PreDiabetes   . GERD (gastroesophageal reflux disease)   . Osteopenia     Screening Tests Immunization History  Administered Date(s) Administered  . Influenza, High Dose Seasonal PF 01/28/2015, 12/11/2015  . Influenza-Unspecified 01/29/2013, 12/27/2013, 01/25/2017  . PPD Test 02/25/2013  . Pneumococcal Conjugate-13 01/24/2017  . Pneumococcal Polysaccharide-23 02/25/2013  . Td 09/13/2001, 02/20/2012  . Tdap 02/25/2013  . Zoster 10/23/2006   Preventative care: Last colonoscopy:  2019 Last mammogram: 03/2016 DUE given number Last pap smear/pelvic exam: 2015 at GYN q 2 years DEXA: 03/2018 -0.7 NORMAL CXR 2012  Prior vaccinations: TD or Tdap: 2014  Influenza:  2019 Pneumococcal: 2014 Prevnar13: 2018 Shingles/Zostavax: 2008  Names of Other Physician/Practitioners you currently use: 1. [facility]- here for primary care 2. Dr. Gershon Crane, eye doctor, last visit June 2019 cataract surgery 3. Has partial, dentist, last visit 2 years Patient Care Team: Unk Pinto, MD as PCP - General (Internal Medicine) Jodi Marble, MD as Consulting Physician (Otolaryngology) Richmond Campbell, MD as Consulting Physician (Gastroenterology) Leo Grosser Seymour Bars, MD as Consulting Physician (Obstetrics and Gynecology) Rutherford Guys, MD as Consulting Physician (Ophthalmology)  Allergies Allergies  Allergen Reactions  . Nexium [Esomeprazole Magnesium] Other (See Comments)    Headache    SURGICAL HISTORY She  has a past surgical history that includes Parotid gland tumor excision (2012); Abdominal hysterectomy; Appendectomy; Tonsillectomy and adenoidectomy; and Cataract extraction, bilateral (Bilateral, 09/2017). FAMILY HISTORY Her family history includes Diabetes in her brother and sister;  Heart disease in her brother; Hypertension in her father; Leukemia in her mother. SOCIAL HISTORY She  reports that she has never smoked. She has never used smokeless tobacco. She reports that she does not drink alcohol.  MEDICARE WELLNESS OBJECTIVES: Physical activity: Current Exercise Habits: The patient does not participate in regular exercise at present(has to take care of her husband with dementia) Cardiac risk factors: Cardiac Risk Factors include: advanced age (>58men, >35 women);dyslipidemia;hypertension;sedentary lifestyle;obesity (BMI >30kg/m2) Depression/mood screen:   Depression screen [facility] 2/9 04/13/2018  Decreased Interest 0  Down, Depressed, Hopeless 0  PHQ - 2 Score 0    ADLs:  In your present state of health, do you have any difficulty performing the following activities: 04/13/2018 12/08/2017  Hearing? N N  Vision? N N  Difficulty concentrating or making decisions? N N  Walking or climbing stairs? N N  Dressing or bathing? N N  Doing errands, shopping? N N  Some recent data might be hidden     Cognitive Testing  Alert? Yes  Normal Appearance?Yes  Oriented to person? Yes  Place? Yes   Time? Yes  Recall of three objects?  Yes  Can perform simple calculations? Yes  Displays appropriate judgment?Yes  Can read the correct time from a watch face?Yes  EOL planning: Does Patient Have a Medical  Advance Directive?: Yes Type of Advance Directive: Healthcare Power of Attorney, Living will Does patient want to make changes to medical advance directive?: No - Patient declined Copy of [place] in Chart?: No - copy requested  Yes has a copy  Objective:   Today's Vitals   04/13/18 1009  BP: 140/84  Pulse: 89  Temp: 97.8 F (36.6 C)  SpO2: 98%  Weight: 159 lb (72.1 kg)  Height: 5' (1.524 m)   Body mass index is 31.05 kg/m.  General appearance: alert, no distress, WD/WN,  female HEENT: normocephalic, sclerae anicteric, TMs pearly, nares patent, no  discharge or erythema, pharynx normal Oral cavity: MMM, no lesions Neck: supple, no lymphadenopathy, no thyromegaly, no masses Heart: RRR, normal S1, S2, no murmurs Lungs: CTA bilaterally, no wheezes, rhonchi, or rales Abdomen: +bs, soft, + epigastric tender, non distended, no masses, no hepatomegaly, no splenomegaly Musculoskeletal: nontender, no swelling, no obvious deformity Extremities: no edema, no cyanosis, no clubbing Pulses: 2+ symmetric, upper and lower extremities, normal cap refill Neurological: alert, oriented x 3, CN2-12 intact, strength normal upper extremities and lower extremities, sensation normal throughout, DTRs 2+ throughout, no cerebellar signs, gait normal Psychiatric: normal affect, behavior normal, pleasant  Skin: seb cyst right post auricular, no lymphadenopathy Breast: defer Gyn: defer Rectal: defer   Medicare Attestation I have personally reviewed: The patient's medical and social history Their use of alcohol, tobacco or illicit drugs Their current medications and supplements The patient's functional ability including ADLs,fall risks, home safety risks, cognitive, and hearing and visual impairment Diet and physical activities Evidence for depression or mood disorders  The patient's weight, height, BMI, and visual acuity have been recorded in the chart.  I have made referrals, counseling, and provided education to the patient based on review of the above and I have provided the patient with a written personalized care plan for preventive services.     Vicie Mutters, PA-C   04/13/2018

## 2018-04-13 ENCOUNTER — Encounter: Payer: Self-pay | Admitting: Physician Assistant

## 2018-04-13 ENCOUNTER — Ambulatory Visit (INDEPENDENT_AMBULATORY_CARE_PROVIDER_SITE_OTHER): Payer: Medicare Other | Admitting: Physician Assistant

## 2018-04-13 VITALS — BP 140/84 | HR 89 | Temp 97.8°F | Ht 60.0 in | Wt 159.0 lb

## 2018-04-13 DIAGNOSIS — E559 Vitamin D deficiency, unspecified: Secondary | ICD-10-CM

## 2018-04-13 DIAGNOSIS — N183 Chronic kidney disease, stage 3 unspecified: Secondary | ICD-10-CM

## 2018-04-13 DIAGNOSIS — E785 Hyperlipidemia, unspecified: Secondary | ICD-10-CM

## 2018-04-13 DIAGNOSIS — Z0001 Encounter for general adult medical examination with abnormal findings: Secondary | ICD-10-CM | POA: Diagnosis not present

## 2018-04-13 DIAGNOSIS — M8580 Other specified disorders of bone density and structure, unspecified site: Secondary | ICD-10-CM

## 2018-04-13 DIAGNOSIS — R7309 Other abnormal glucose: Secondary | ICD-10-CM | POA: Diagnosis not present

## 2018-04-13 DIAGNOSIS — R6889 Other general symptoms and signs: Secondary | ICD-10-CM | POA: Diagnosis not present

## 2018-04-13 DIAGNOSIS — Z0000 Encounter for general adult medical examination without abnormal findings: Secondary | ICD-10-CM

## 2018-04-13 DIAGNOSIS — I10 Essential (primary) hypertension: Secondary | ICD-10-CM | POA: Diagnosis not present

## 2018-04-13 DIAGNOSIS — K219 Gastro-esophageal reflux disease without esophagitis: Secondary | ICD-10-CM

## 2018-04-13 DIAGNOSIS — Z79899 Other long term (current) drug therapy: Secondary | ICD-10-CM

## 2018-04-13 NOTE — Patient Instructions (Addendum)
HOW TO SCHEDULE A MAMMOGRAM  [facility]  7 a.m.-6:30 p.m., Monday 7 a.m.-5 p.m., Tuesday-Friday Schedule an appointment by calling [phone].    HYPERTENSION INFORMATION  Monitor your blood pressure at home, please keep a record and bring that in with you to your next office visit.   Go to the ER if any CP, SOB, nausea, dizziness, severe HA, changes vision/speech  Due to a recent study, SPRINT, we have changed our goal for the systolic or top blood pressure number. Ideally we want your top number at 120.  In the [facility] Trial, 5000 people were randomized to a goal BP of 120 and 5000 people were randomized to a goal BP of less than 140. The patients with the goal BP at 120 had LESS DEMENTIA, LESS HEART ATTACKS, AND LESS STROKES, AS WELL AS OVERALL DECREASED MORTALITY OR DEATH RATE.   If you are willing, our goal BP is the top number of 120.  Your most recent BP: BP: 140/84   Take your medications faithfully as instructed. Maintain a healthy weight. Get at least 150 minutes of aerobic exercise per week. Minimize salt intake. Minimize alcohol intake  DASH Eating Plan DASH stands for "Dietary Approaches to Stop Hypertension." The DASH eating plan is a healthy eating plan that has been shown to reduce high blood pressure (hypertension). Additional health benefits may include reducing the risk of type 2 diabetes mellitus, heart disease, and stroke. The DASH eating plan may also help with weight loss. WHAT DO I NEED TO KNOW ABOUT THE DASH EATING PLAN? For the DASH eating plan, you will follow these general guidelines:  Choose foods with a percent daily value for sodium of less than 5% (as listed on the food label).  Use salt-free seasonings or herbs instead of table salt or sea salt.  Check with your health care provider or pharmacist before using salt substitutes.  Eat lower-sodium products, often labeled as "lower sodium" or "no salt added."  Eat  fresh foods.  Eat more vegetables, fruits, and low-fat dairy products.  Choose whole grains. Look for the word "whole" as the first word in the ingredient list.  Choose fish and skinless chicken or [place] more often than red meat. Limit fish, poultry, and meat to 6 oz (170 g) each day.  Limit sweets, desserts, sugars, and sugary drinks.  Choose heart-healthy fats.  Limit cheese to 1 oz (28 g) per day.  Eat more home-cooked food and less restaurant, buffet, and fast food.  Limit fried foods.  Cook foods using methods other than frying.  Limit canned vegetables. If you do use them, rinse them well to decrease the sodium.  When eating at a restaurant, ask that your food be prepared with less salt, or no salt if possible. WHAT FOODS CAN I EAT? Seek help from a dietitian for individual calorie needs. Grains Whole grain or whole wheat bread. Brown rice. Whole grain or whole wheat pasta. Quinoa, bulgur, and whole grain cereals. Low-sodium cereals. Corn or whole wheat flour tortillas. Whole grain cornbread. Whole grain crackers. Low-sodium crackers. Vegetables Fresh or frozen vegetables (raw, steamed, roasted, or grilled). Low-sodium or reduced-sodium tomato and vegetable juices. Low-sodium or reduced-sodium tomato sauce and paste. Low-sodium or reduced-sodium canned vegetables.  Fruits All fresh, canned (in natural juice), or frozen fruits. Meat and Other Protein Products Ground beef (85% or leaner), grass-fed beef, or beef trimmed of fat. Skinless chicken or [place]. Ground chicken or [place]. Pork trimmed of fat.  All fish and seafood. Eggs. Dried beans, peas, or lentils. Unsalted nuts and seeds. Unsalted canned beans. Dairy Low-fat dairy products, such as skim or 1% milk, 2% or reduced-fat cheeses, low-fat ricotta or cottage cheese, or plain low-fat yogurt. Low-sodium or reduced-sodium cheeses. Fats and Oils Tub margarines without trans fats. Light or reduced-fat mayonnaise and salad  dressings (reduced sodium). Avocado. Safflower, olive, or canola oils. Natural peanut or almond butter. Other Unsalted popcorn and pretzels. The items listed above may not be a complete list of recommended foods or beverages. Contact your dietitian for more options. WHAT FOODS ARE NOT RECOMMENDED? Grains White bread. White pasta. White rice. Refined cornbread. Bagels and croissants. Crackers that contain trans fat. Vegetables Creamed or fried vegetables. Vegetables in a cheese sauce. Regular canned vegetables. Regular canned tomato sauce and paste. Regular tomato and vegetable juices. Fruits Dried fruits. Canned fruit in light or heavy syrup. Fruit juice. Meat and Other Protein Products Fatty cuts of meat. Ribs, chicken wings, bacon, sausage, bologna, salami, chitterlings, fatback, hot dogs, bratwurst, and packaged luncheon meats. Salted nuts and seeds. Canned beans with salt. Dairy Whole or 2% milk, cream, half-and-half, and cream cheese. Whole-fat or sweetened yogurt. Full-fat cheeses or blue cheese. Nondairy creamers and whipped toppings. Processed cheese, cheese spreads, or cheese curds. Condiments Onion and garlic salt, seasoned salt, table salt, and sea salt. Canned and packaged gravies. Worcestershire sauce. Tartar sauce. Barbecue sauce. Teriyaki sauce. Soy sauce, including reduced sodium. Steak sauce. Fish sauce. Oyster sauce. Cocktail sauce. Horseradish. Ketchup and mustard. Meat flavorings and tenderizers. Bouillon cubes. Hot sauce. Tabasco sauce. Marinades. Taco seasonings. Relishes. Fats and Oils Butter, stick margarine, lard, shortening, ghee, and bacon fat. Coconut, palm kernel, or palm oils. Regular salad dressings. Other Pickles and olives. Salted popcorn and pretzels. The items listed above may not be a complete list of foods and beverages to avoid. Contact your dietitian for more information. WHERE CAN I FIND MORE INFORMATION? National Heart, Lung, and Blood Institute:  travelstabloid.com Document Released: 03/10/2011 Document Revised: 08/05/2013 Document Reviewed: 01/23/2013 [facility] Patient Information 2015 [place], [place]. This information is not intended to replace advice given to you by your health care provider. Make sure you discuss any questions you have with your health care provider.     Do the carpal tunnel brace from a pharmacy at night for 4-6 weeks, if this does not help we can refer to ortho.  Carpal Tunnel Syndrome  Carpal tunnel syndrome is a condition that causes pain in your hand and arm. The carpal tunnel is a narrow area located on the palm side of your wrist. Repeated wrist motion or certain diseases may cause swelling within the tunnel. This swelling pinches the main nerve in the wrist (median nerve). What are the causes? This condition may be caused by:  Repeated wrist motions.  Wrist injuries.  Arthritis.  A cyst or tumor in the carpal tunnel.  Fluid buildup during pregnancy.  Sometimes the cause of this condition is not known. What increases the risk? This condition is more likely to develop in:  People who have jobs that cause them to repeatedly move their wrists in the same motion, such as Art gallery manager.  Women.  People with certain conditions, such as: ? Diabetes. ? Obesity. ? An underactive thyroid (hypothyroidism). ? Kidney failure.  What are the signs or symptoms? Symptoms of this condition include:  A tingling feeling in your fingers, especially in your thumb, index, and middle fingers.  Tingling or numbness in your  hand.  An aching feeling in your entire arm, especially when your wrist and elbow are bent for long periods of time.  Wrist pain that goes up your arm to your shoulder.  Pain that goes down into your palm or fingers.  A weak feeling in your hands. You may have trouble grabbing and holding items.  Your symptoms may feel worse during the  night. How is this diagnosed? This condition is diagnosed with a medical history and physical exam. You may also have tests, including:  An electromyogram (EMG). This test measures electrical signals sent by your nerves into the muscles.  X-rays.  How is this treated? Treatment for this condition includes:  Lifestyle changes. It is important to stop doing or modify the activity that caused your condition.  Physical or occupational therapy.  Medicines for pain and inflammation. This may include medicine that is injected into your wrist.  A wrist splint.  Surgery.  Follow these instructions at home: If you have a splint:  Wear it as told by your health care provider. Remove it only as told by your health care provider.  Loosen the splint if your fingers become numb and tingle, or if they turn cold and blue.  Keep the splint clean and dry. General instructions  Take over-the-counter and prescription medicines only as told by your health care provider.  Rest your wrist from any activity that may be causing your pain. If your condition is work related, talk to your employer about changes that can be made, such as getting a wrist pad to use while typing.  If directed, apply ice to the painful area: ? Put ice in a plastic bag. ? Place a towel between your skin and the bag. ? Leave the ice on for 20 minutes, 2-3 times per day.  Keep all follow-up visits as told by your health care provider. This is important.  Do any exercises as told by your health care provider, physical therapist, or occupational therapist. Contact a health care provider if:  You have new symptoms.  Your pain is not controlled with medicines.  Your symptoms get worse. This information is not intended to replace advice given to you by your health care provider. Make sure you discuss any questions you have with your health care provider. Document Released: 03/18/2000 Document Revised: 07/30/2015 Document  Reviewed: 08/06/2014 Elsevier Interactive Patient Education  [employer].

## 2018-04-14 LAB — HEMOGLOBIN A1C
Hgb A1c MFr Bld: 5.8 % of total Hgb — ABNORMAL HIGH (ref <5.7)
Mean Plasma Glucose: 120 (calc)
eAG (mmol/L): 6.6 (calc)

## 2018-04-14 LAB — LIPID PANEL
Cholesterol: 175 mg/dL (ref <200)
HDL: 46 mg/dL — ABNORMAL LOW (ref >50)
LDL Cholesterol (Calc): 104 mg/dL (calc) — ABNORMAL HIGH
Non-HDL Cholesterol (Calc): 129 mg/dL (calc) (ref <130)
Total CHOL/HDL Ratio: 3.8 (calc) (ref <5.0)
Triglycerides: 150 mg/dL — ABNORMAL HIGH (ref <150)

## 2018-04-14 LAB — CBC WITH DIFFERENTIAL/PLATELET
Absolute Monocytes: 513 cells/uL (ref 200–950)
Basophils Absolute: 30 cells/uL (ref 0–200)
Basophils Relative: 0.5 %
Eosinophils Absolute: 148 cells/uL (ref 15–500)
Eosinophils Relative: 2.5 %
HCT: 43.6 % (ref 35.0–45.0)
Hemoglobin: 14.8 g/dL (ref 11.7–15.5)
Lymphs Abs: 2897 cells/uL (ref 850–3900)
MCH: 30.3 pg (ref 27.0–33.0)
MCHC: 33.9 g/dL (ref 32.0–36.0)
MCV: 89.3 fL (ref 80.0–100.0)
MPV: 11.5 fL (ref 7.5–12.5)
Monocytes Relative: 8.7 %
Neutro Abs: 2313 cells/uL (ref 1500–7800)
Neutrophils Relative %: 39.2 %
Platelets: 177 10*3/uL (ref 140–400)
RBC: 4.88 10*6/uL (ref 3.80–5.10)
RDW: 12.7 % (ref 11.0–15.0)
Total Lymphocyte: 49.1 %
WBC: 5.9 10*3/uL (ref 3.8–10.8)

## 2018-04-14 LAB — COMPLETE METABOLIC PANEL WITH GFR
AG Ratio: 2 (calc) (ref 1.0–2.5)
ALT: 15 U/L (ref 6–29)
AST: 18 U/L (ref 10–35)
Albumin: 4.7 g/dL (ref 3.6–5.1)
Alkaline phosphatase (APISO): 63 U/L (ref 33–130)
BUN/Creatinine Ratio: 17 (calc) (ref 6–22)
BUN: 20 mg/dL (ref 7–25)
CO2: 30 mmol/L (ref 20–32)
Calcium: 10.3 mg/dL (ref 8.6–10.4)
Chloride: 103 mmol/L (ref 98–110)
Creat: 1.2 mg/dL — ABNORMAL HIGH (ref 0.60–0.93)
GFR, Est African American: 50 mL/min/{1.73_m2} — ABNORMAL LOW (ref >=60)
GFR, Est Non African American: 44 mL/min/{1.73_m2} — ABNORMAL LOW (ref >=60)
Globulin: 2.3 g/dL (calc) (ref 1.9–3.7)
Glucose, Bld: 90 mg/dL (ref 65–99)
Potassium: 4.6 mmol/L (ref 3.5–5.3)
Sodium: 143 mmol/L (ref 135–146)
Total Bilirubin: 0.9 mg/dL (ref 0.2–1.2)
Total Protein: 7 g/dL (ref 6.1–8.1)

## 2018-04-14 LAB — MAGNESIUM: Magnesium: 2 mg/dL (ref 1.5–2.5)

## 2018-04-14 LAB — TSH: TSH: 2.02 mIU/L (ref 0.40–4.50)

## 2018-06-08 ENCOUNTER — Other Ambulatory Visit: Payer: Self-pay | Admitting: Physician Assistant

## 2018-09-10 ENCOUNTER — Other Ambulatory Visit: Payer: Self-pay

## 2018-09-10 MED ORDER — AMLODIPINE BESYLATE 5 MG PO TABS: 5.0000 mg | ORAL_TABLET | Freq: Every day | ORAL | 1 refills | Status: DC

## 2018-10-26 ENCOUNTER — Encounter: Payer: Self-pay | Admitting: Internal Medicine

## 2018-11-05 ENCOUNTER — Other Ambulatory Visit: Payer: Self-pay

## 2018-11-05 ENCOUNTER — Encounter: Payer: Self-pay | Admitting: Internal Medicine

## 2018-11-05 ENCOUNTER — Ambulatory Visit (INDEPENDENT_AMBULATORY_CARE_PROVIDER_SITE_OTHER): Payer: Medicare Other | Admitting: Internal Medicine

## 2018-11-05 VITALS — BP 142/80 | HR 76 | Temp 97.9°F | Resp 16 | Ht 60.0 in | Wt 161.4 lb

## 2018-11-05 DIAGNOSIS — E782 Mixed hyperlipidemia: Secondary | ICD-10-CM | POA: Diagnosis not present

## 2018-11-05 DIAGNOSIS — K219 Gastro-esophageal reflux disease without esophagitis: Secondary | ICD-10-CM | POA: Diagnosis not present

## 2018-11-05 DIAGNOSIS — Z8249 Family history of ischemic heart disease and other diseases of the circulatory system: Secondary | ICD-10-CM

## 2018-11-05 DIAGNOSIS — I10 Essential (primary) hypertension: Secondary | ICD-10-CM | POA: Diagnosis not present

## 2018-11-05 DIAGNOSIS — E559 Vitamin D deficiency, unspecified: Secondary | ICD-10-CM

## 2018-11-05 DIAGNOSIS — Z136 Encounter for screening for cardiovascular disorders: Secondary | ICD-10-CM

## 2018-11-05 DIAGNOSIS — Z0001 Encounter for general adult medical examination with abnormal findings: Secondary | ICD-10-CM | POA: Diagnosis not present

## 2018-11-05 DIAGNOSIS — R7309 Other abnormal glucose: Secondary | ICD-10-CM

## 2018-11-05 DIAGNOSIS — Z79899 Other long term (current) drug therapy: Secondary | ICD-10-CM

## 2018-11-05 DIAGNOSIS — Z1211 Encounter for screening for malignant neoplasm of colon: Secondary | ICD-10-CM

## 2018-11-05 MED ORDER — PREDNISONE 20 MG PO TABS: ORAL_TABLET | ORAL | 0 refills | Status: DC

## 2018-11-05 NOTE — Progress Notes (Signed)
Annual Screening/Preventative Visit & Comprehensive Evaluation &  Examination     This very nice 78 y.o. MBF presents for a Screening /Preventative Visit & comprehensive evaluation and management of multiple medical co-morbidities.  Patient has been followed for HTN, HLD, Prediabetes  and Vitamin D Deficiency. Patient has GERD controlled on her meds.      HTN predates since 2000. Patient's BP has been controlled at home and patient denies any cardiac symptoms as chest pain, palpitations, shortness of breath, dizziness or ankle swelling. 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 Today's BP was elevated at 142/80 and rechecked at 154/80     Patient's hyperlipidemia is not controlled with diet. Last lipids were not at goal: Lab Results  Component Value Date   CHOL 175 04/13/2018   HDL 46 (L) 04/13/2018   LDLCALC 104 (H) 04/13/2018   TRIG 150 (H) 04/13/2018   CHOLHDL 3.8 04/13/2018      Patient has hx/o prediabetes (A1c 5.8% / 2008) and patient denies reactive hypoglycemic symptoms, visual blurring, diabetic polys or paresthesias. Last A1c was not at goal: Lab Results  Component Value Date   HGBA1C 5.8 (H) 04/13/2018      Finally, patient has history of Vitamin D Deficiency ("36" / 2016)  and last Vitamin D was not at goal (70-100):  Lab Results  Component Value Date   VD25OH 49 07/17/2017   Current Outpatient Medications on File Prior to Visit  Medication Sig  . amLODipine (NORVASC) 5 MG tablet Take 1 tablet (5 mg total) by mouth daily.  . Ascorbic Acid (VITAMIN C) 1000 MG tablet Take 1,000 mg by mouth daily.  Marland Kitchen aspirin EC 81 MG tablet Take 81 mg by mouth daily.  . Cholecalciferol (VITAMIN D) 2000 UNITS tablet Take 4,000 Units by mouth daily.    No current facility-administered medications on file prior to visit.    Allergies  Allergen Reactions  . Nexium [Esomeprazole Magnesium] Other (See Comments)    Headache   Past Medical History:  Diagnosis Date  . Early  cataracts, bilateral   . Elevated hemoglobin A1c   . Fibroids   . GERD (gastroesophageal reflux disease)   . Hyperlipidemia   . Hypertension   . Osteopenia    Health Maintenance  Topic Date Due  . INFLUENZA VACCINE  11/03/2018  . TETANUS/TDAP  02/26/2023  . DEXA SCAN  Completed  . PNA vac Low Risk Adult  Completed   Immunization History  Administered Date(s) Administered  . Influenza, High Dose Seasonal PF 01/28/2015, 12/11/2015  . Influenza-Unspecified 01/29/2013, 12/27/2013, 01/25/2017  . PPD Test 02/25/2013  . Pneumococcal Conjugate-13 01/24/2017  . Pneumococcal Polysaccharide-23 02/25/2013  . Td 09/13/2001, 02/20/2012  . Tdap 02/25/2013  . Zoster 10/23/2006   Last Colon - 05/23/2017 - Dr Earlean Shawl - Recc 5 yr f/u - due Feb 2015  Last MGM - 03/2018 at GYN office  Past Surgical History:  Procedure Laterality Date  . ABDOMINAL HYSTERECTOMY    . APPENDECTOMY    . CATARACT EXTRACTION, BILATERAL Bilateral 09/2017   Dr. Richardean Sale  . 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 TONSILLECTOMY AND ADENOIDECTOMY     Family History  Problem Relation Age of Onset  . Leukemia Mother   . Hypertension Father   . Diabetes Sister   . Heart disease Brother   . Diabetes Brother    Social History   Tobacco Use  . Smoking status: Never Smoker  . Smokeless tobacco: Never Used  Substance Use Topics  . Alcohol use: No  . Drug use: Not on file    ROS Constitutional: Denies fever, chills, weight loss/gain, headaches, insomnia,  night sweats, and change in appetite. Does c/o fatigue. Eyes: Denies redness, blurred vision, diplopia, discharge, itchy, watery eyes.  ENT: Denies discharge, congestion, post nasal drip, epistaxis, sore throat, earache, hearing loss, dental pain, Tinnitus, Vertigo, Sinus pain, snoring.  Cardio: Denies chest pain, palpitations, irregular heartbeat, syncope, dyspnea, diaphoresis, orthopnea, PND, claudication, edema Respiratory: denies cough, dyspnea, DOE,  pleurisy, hoarseness, laryngitis, wheezing.  Gastrointestinal: Denies dysphagia, heartburn, reflux, water brash, pain, cramps, nausea, vomiting, bloating, diarrhea, constipation, hematemesis, melena, hematochezia, jaundice, hemorrhoids Genitourinary: Denies dysuria, frequency, urgency, nocturia, hesitancy, discharge, hematuria, flank pain Breast: Breast lumps, nipple discharge, bleeding.  Musculoskeletal: Denies arthralgia, myalgia, stiffness, Jt. Swelling, pain, limp, and strain/sprain. Denies falls. Skin: Denies puritis, rash, hives, warts, acne, eczema, changing in skin lesion Neuro: No weakness, tremor, incoordination, spasms, paresthesia, pain Psychiatric: Denies confusion, memory loss, sensory loss. Denies Depression. Endocrine: Denies change in weight, skin, hair change, nocturia, and paresthesia, diabetic polys, visual blurring, hyper / hypo glycemic episodes.  Heme/Lymph: No excessive bleeding, bruising, enlarged lymph nodes.  Physical Exam  BP (!) 142/80   Pulse 76   Temp 97.9 F (36.6 C)   Resp 16   Ht 5' (1.524 m)   Wt 161 lb 6.4 oz (73.2 kg)   BMI 31.52 kg/m   BP rechecked at 154/80  General Appearance: Over nourished, well groomed and in no apparent distress.  Eyes:  Aphakia, OU.  PERRLA, EOMs, conjunctiva no swelling or erythema, normal fundi and vessels. Sinuses: No frontal/maxillary tenderness ENT/Mouth: EACs patent / TMs  nl. Nares clear without erythema, swelling, mucoid exudates. Oral hygiene is good. No erythema, swelling, or exudate. Tongue normal, non-obstructing. Tonsils not swollen or erythematous. Hearing normal.  Neck: Supple, thyroid not palpable. No bruits, nodes or JVD. Respiratory: Respiratory effort normal.  BS equal and clear bilateral without rales, rhonci, wheezing or stridor. Cardio: Heart sounds are normal with regular rate and rhythm and no murmurs, rubs or gallops. Peripheral pulses are normal and equal bilaterally without edema. No aortic or  femoral bruits. Chest: symmetric with normal excursions and percussion. Breasts: Symmetric, without lumps, nipple discharge, retractions, or fibrocystic changes.  Abdomen: Rotund, soft with bowel sounds active. Nontender, no guarding, rebound, hernias, masses, or organomegaly.  Lymphatics: Non tender without lymphadenopathy.  Musculoskeletal: Full ROM all peripheral extremities, joint stability, 5/5 strength, and normal gait. Skin: Warm and dry without rashes, lesions, cyanosis, clubbing or  ecchymosis.  Neuro: Cranial nerves intact, reflexes equal bilaterally. Normal muscle tone, no cerebellar symptoms. Sensation intact.  Pysch: Alert and oriented X 3, normal affect, Insight and Judgment appropriate.   Assessment and Plan  1. Annual Preventative Screening Examination  2. Essential hypertension  - advised bid monitoring of BP's & call if over 150/90  - EKG 12-Lead - Urinalysis, Routine w reflex microscopic - Microalbumin / creatinine urine ratio - CBC with Differential/Platelet - COMPLETE METABOLIC PANEL WITH GFR - Magnesium  3. Hyperlipidemia, mixed  - EKG 12-Lead - Lipid panel - TSH  4. Abnormal glucose  - EKG 12-Lead - Hemoglobin A1c - Insulin, random  5. Vitamin D deficiency  - VITAMIN D 25 Hydroxyl  6. PreDiabetes  - EKG 12-Lead - Hemoglobin A1c - Insulin, random  7. Gastroesophageal reflux disease  - CBC with Differential/Platelet  8. Screening for colorectal cancer  - POC Hemoccult Bld/Stl   9. Screening for  ischemic heart disease  - EKG 12-Lead  10. FHx: heart disease  - EKG 12-Lead  11. Medication management  - Urinalysis, Routine w reflex microscopic - Microalbumin / creatinine urine ratio - CBC with Differential/Platelet - COMPLETE METABOLIC PANEL WITH GFR - Magnesium - Lipid panel - TSH - Hemoglobin A1c - Insulin, random - VITAMIN D 25 Hydroxyl        Patient was counseled in prudent diet to achieve/maintain BMI less than 25 for  weight control, BP monitoring, regular exercise and medications. Discussed med's effects and SE's. Screening labs and tests as requested with regular follow-up as recommended. Over 40 minutes of exam, counseling, chart review and high complex critical decision making was performed.   Kirtland Bouchard, MD

## 2018-11-05 NOTE — Patient Instructions (Signed)

## 2018-11-06 LAB — URINALYSIS, ROUTINE W REFLEX MICROSCOPIC
Bacteria, UA: NONE SEEN /HPF
Bilirubin Urine: NEGATIVE
Glucose, UA: NEGATIVE
Hgb urine dipstick: NEGATIVE
Hyaline Cast: NONE SEEN /LPF
Ketones, ur: NEGATIVE
Nitrite: NEGATIVE
Protein, ur: NEGATIVE
RBC / HPF: NONE SEEN /HPF (ref 0–2)
Specific Gravity, Urine: 1.01 (ref 1.001–1.03)
pH: 6.5 (ref 5.0–8.0)

## 2018-11-06 LAB — CBC WITH DIFFERENTIAL/PLATELET
Absolute Monocytes: 476 cells/uL (ref 200–950)
Basophils Absolute: 20 cells/uL (ref 0–200)
Basophils Relative: 0.3 %
Eosinophils Absolute: 156 cells/uL (ref 15–500)
Eosinophils Relative: 2.3 %
HCT: 41.5 % (ref 35.0–45.0)
Hemoglobin: 14.3 g/dL (ref 11.7–15.5)
Lymphs Abs: 3522 cells/uL (ref 850–3900)
MCH: 30.8 pg (ref 27.0–33.0)
MCHC: 34.5 g/dL (ref 32.0–36.0)
MCV: 89.4 fL (ref 80.0–100.0)
MPV: 12.7 fL — ABNORMAL HIGH (ref 7.5–12.5)
Monocytes Relative: 7 %
Neutro Abs: 2625 cells/uL (ref 1500–7800)
Neutrophils Relative %: 38.6 %
Platelets: 128 10*3/uL — ABNORMAL LOW (ref 140–400)
RBC: 4.64 10*6/uL (ref 3.80–5.10)
RDW: 12.5 % (ref 11.0–15.0)
Total Lymphocyte: 51.8 %
WBC: 6.8 10*3/uL (ref 3.8–10.8)

## 2018-11-06 LAB — COMPLETE METABOLIC PANEL WITH GFR
AG Ratio: 2.1 (calc) (ref 1.0–2.5)
ALT: 22 U/L (ref 6–29)
AST: 22 U/L (ref 10–35)
Albumin: 4.8 g/dL (ref 3.6–5.1)
Alkaline phosphatase (APISO): 57 U/L (ref 37–153)
BUN/Creatinine Ratio: 14 (calc) (ref 6–22)
BUN: 16 mg/dL (ref 7–25)
CO2: 27 mmol/L (ref 20–32)
Calcium: 10.3 mg/dL (ref 8.6–10.4)
Chloride: 103 mmol/L (ref 98–110)
Creat: 1.15 mg/dL — ABNORMAL HIGH (ref 0.60–0.93)
GFR, Est African American: 53 mL/min/{1.73_m2} — ABNORMAL LOW (ref >=60)
GFR, Est Non African American: 46 mL/min/{1.73_m2} — ABNORMAL LOW (ref >=60)
Globulin: 2.3 g/dL (calc) (ref 1.9–3.7)
Glucose, Bld: 96 mg/dL (ref 65–99)
Potassium: 4.4 mmol/L (ref 3.5–5.3)
Sodium: 141 mmol/L (ref 135–146)
Total Bilirubin: 1.1 mg/dL (ref 0.2–1.2)
Total Protein: 7.1 g/dL (ref 6.1–8.1)

## 2018-11-06 LAB — HEMOGLOBIN A1C
Hgb A1c MFr Bld: 5.8 % of total Hgb — ABNORMAL HIGH (ref <5.7)
Mean Plasma Glucose: 120 (calc)
eAG (mmol/L): 6.6 (calc)

## 2018-11-06 LAB — MICROALBUMIN / CREATININE URINE RATIO
Creatinine, Urine: 67 mg/dL (ref 20–275)
Microalb Creat Ratio: 9 mcg/mg creat (ref <30)
Microalb, Ur: 0.6 mg/dL

## 2018-11-06 LAB — MAGNESIUM: Magnesium: 2.2 mg/dL (ref 1.5–2.5)

## 2018-11-06 LAB — VITAMIN D 25 HYDROXY (VIT D DEFICIENCY, FRACTURES): Vit D, 25-Hydroxy: 49 ng/mL (ref 30–100)

## 2018-11-06 LAB — LIPID PANEL
Cholesterol: 171 mg/dL (ref <200)
HDL: 41 mg/dL — ABNORMAL LOW (ref >=50)
LDL Cholesterol (Calc): 96 mg/dL (calc)
Non-HDL Cholesterol (Calc): 130 mg/dL (calc) — ABNORMAL HIGH (ref <130)
Total CHOL/HDL Ratio: 4.2 (calc) (ref <5.0)
Triglycerides: 217 mg/dL — ABNORMAL HIGH (ref <150)

## 2018-11-06 LAB — TSH: TSH: 3.46 mIU/L (ref 0.40–4.50)

## 2018-11-06 LAB — INSULIN, RANDOM: Insulin: 16 u[IU]/mL

## 2018-12-14 ENCOUNTER — Ambulatory Visit: Payer: Self-pay | Admitting: Physician Assistant

## 2019-01-03 DIAGNOSIS — H524 Presbyopia: Secondary | ICD-10-CM | POA: Diagnosis not present

## 2019-01-03 DIAGNOSIS — H04123 Dry eye syndrome of bilateral lacrimal glands: Secondary | ICD-10-CM | POA: Diagnosis not present

## 2019-01-03 DIAGNOSIS — Z961 Presence of intraocular lens: Secondary | ICD-10-CM | POA: Diagnosis not present

## 2019-01-31 ENCOUNTER — Other Ambulatory Visit: Payer: Self-pay | Admitting: Physician Assistant

## 2019-02-06 NOTE — Progress Notes (Signed)
Patient ID: Jacqueline Hancock, female   DOB: [birth year], 78 y.o.   MRN: [id number]  Assessment and Plan:  Abdominal swelling + upper AB swelling, possible fluid wave and hepatomegaly on exam, will get AB [place] and labs today Weight loss encouraged No nausea, vomiting, AB pain, SOB with exertion, CP -     CBC with Diff -     COMPLETE METABOLIC PANEL WITH GFR -     US Abdomen Complete  AB pain; Future -     Gamma GT  Essential hypertension - continue medications, DASH diet, exercise and monitor at home. Call if greater than 130/80.  -     CBC with Diff -     COMPLETE METABOLIC PANEL WITH GFR -     TSH  Stage 3 chronic kidney disease, unspecified whether stage 3a or 3b CKD -     COMPLETE METABOLIC PANEL WITH GFR Increase fluids, avoid NSAIDS, monitor sugars, will monitor  Hyperlipidemia, unspecified hyperlipidemia type -     Lipid Profile check lipids decrease fatty foods increase activity.   Abnormal glucose -     Hemoglobin A1c ([facility])  Medication management -     Magnesium -     Gamma GT  Vitamin D deficiency -     Vitamin D (25 hydroxy)   Continue diet and meds as discussed. Further disposition pending results of labs. Future Appointments  Date Time Provider [place]  05/14/2019  9:00 AM Vicie Mutters, PA-C [facility] None  08/12/2019  9:30 AM Unk Pinto, MD [facility] None  12/11/2019  3:00 PM Unk Pinto, MD [facility] None    HPI 78 y.o. female  presents for 3 month follow up with hypertension, hyperlipidemia, prediabetes and vitamin D.  She complains of new AB swelling x several months, having to sleep on pillows due to SOB, she denies SOB with exertion, leg swelling, nausea, vomiting, diarrhea, constipation.  Normal CT AB 2009 other than fatty liver.   She also complains of right leg numbness, comes and goes, at night, some SI/right back pain.    Her blood pressure has been controlled at home, today their BP is BP: 138/76.   She does not work out,  however she takes care of her husband with dementia daily, keeps the house, feeds him, and does his medications.  She denies chest pain, shortness of breath, dizziness.  BMI is Body mass index is 31.76 kg/m., she is working on diet and exercise. Wt Readings from Last 3 Encounters:  02/11/19 162 lb 9.6 oz (73.8 kg)  11/05/18 161 lb 6.4 oz (73.2 kg)  04/13/18 159 lb (72.1 kg)    She is not on cholesterol medication and denies myalgias. Her cholesterol is not at goal. The cholesterol last visit was:   Lab Results  Component Value Date   CHOL 171 11/05/2018   HDL 41 (L) 11/05/2018   LDLCALC 96 11/05/2018   TRIG 217 (H) 11/05/2018   CHOLHDL 4.2 11/05/2018    She has been working on diet and exercise for prediabetes, and denies foot ulcerations, hyperglycemia, hypoglycemia , increased appetite, nausea, paresthesia of the feet, polydipsia, polyuria, visual disturbances, vomiting and weight loss. Last A1C in the office was:  Lab Results  Component Value Date   HGBA1C 5.8 (H) 11/05/2018   Patient is on Vitamin D supplement.  Lab Results  Component Value Date   VD25OH 49 11/05/2018      Current Medications:  Current Outpatient Medications on File Prior to Visit  Medication  Sig Dispense Refill  . amLODipine (NORVASC) 5 MG tablet TAKE 1 TABLET BY MOUTH  DAILY 90 tablet 3  . Ascorbic Acid (VITAMIN C) 1000 MG tablet Take 1,000 mg by mouth daily.    Marland Kitchen aspirin EC 81 MG tablet Take 81 mg by mouth daily.    . Cholecalciferol (VITAMIN D) 2000 UNITS tablet Take 4,000 Units by mouth daily.      No current facility-administered medications on file prior to visit.     Medical History:  Past Medical History:  Diagnosis Date  . Early cataracts, bilateral   . Elevated hemoglobin A1c   . Fibroids   . GERD (gastroesophageal reflux disease)   . Hyperlipidemia   . Hypertension   . Osteopenia     Allergies:  Allergies  Allergen Reactions  . Nexium [Esomeprazole Magnesium] Other (See Comments)     Headache     Review of Systems:  Review of Systems  Constitutional: Negative for chills, fever and malaise/fatigue.  HENT: Negative for congestion, ear pain, sore throat and tinnitus.   Eyes: Negative.   Respiratory: Negative for cough, shortness of breath and wheezing.   Cardiovascular: Negative for chest pain, palpitations and leg swelling.  Gastrointestinal: Negative for abdominal pain, blood in stool, constipation, diarrhea, heartburn, melena, nausea and vomiting.       AB bloating  Genitourinary: Negative.  Negative for frequency and urgency.  Musculoskeletal: Positive for back pain. Negative for falls, joint pain, myalgias and neck pain.  Skin: Negative.   Neurological: Negative for dizziness, sensory change and headaches.  Psychiatric/Behavioral: Negative for depression. The patient is not nervous/anxious and does not have insomnia.     Family history- Review and unchanged  Social history- Review and unchanged  Physical Exam: BP 138/76   Pulse 67   Temp 98.1 F (36.7 C)   Wt 162 lb 9.6 oz (73.8 kg)   SpO2 97%   BMI 31.76 kg/m  Wt Readings from Last 3 Encounters:  02/11/19 162 lb 9.6 oz (73.8 kg)  11/05/18 161 lb 6.4 oz (73.2 kg)  04/13/18 159 lb (72.1 kg)   General Appearance: Well nourished well developed, in no apparent distress. Eyes: PERRLA, EOMs, conjunctiva no swelling or erythema ENT/Mouth: Ear canals normal without obstruction, swelling, erythma, discharge.  TMs normal bilaterally.  Oropharynx moist, clear, without exudate, or postoropharyngeal swelling. Neck: Supple, thyroid normal,no cervical adenopathy  Respiratory: Respiratory effort normal, Breath sounds clear A&P without rhonchi, wheeze, or rale.  No retractions, no accessory usage. Cardio: RRR with no MRGs. Brisk peripheral pulses without edema.  Abdomen: Soft, + fluid wave, decreased BS,  Non tender + hepatomegaly, no guarding, rebound, hernias.  Musculoskeletal: Full ROM, 5/5 strength, Normal  gait, negative straight leg raise, + right SI tenderness Skin: Warm, dry without rashes, lesions, ecchymosis.  Neuro: Awake and oriented X 3, Cranial nerves intact. Normal muscle tone, no cerebellar symptoms. Psych: Normal affect, Insight and Judgment appropriate.    Vicie Mutters, PA-C 8:48 AM [facility]

## 2019-02-11 ENCOUNTER — Ambulatory Visit (INDEPENDENT_AMBULATORY_CARE_PROVIDER_SITE_OTHER): Payer: Medicare Other | Admitting: Physician Assistant

## 2019-02-11 ENCOUNTER — Other Ambulatory Visit: Payer: Self-pay

## 2019-02-11 ENCOUNTER — Encounter: Payer: Self-pay | Admitting: Physician Assistant

## 2019-02-11 VITALS — BP 138/76 | HR 67 | Temp 98.1°F | Wt 162.6 lb

## 2019-02-11 DIAGNOSIS — E785 Hyperlipidemia, unspecified: Secondary | ICD-10-CM | POA: Diagnosis not present

## 2019-02-11 DIAGNOSIS — Z79899 Other long term (current) drug therapy: Secondary | ICD-10-CM

## 2019-02-11 DIAGNOSIS — R7309 Other abnormal glucose: Secondary | ICD-10-CM

## 2019-02-11 DIAGNOSIS — R1900 Intra-abdominal and pelvic swelling, mass and lump, unspecified site: Secondary | ICD-10-CM

## 2019-02-11 DIAGNOSIS — E559 Vitamin D deficiency, unspecified: Secondary | ICD-10-CM

## 2019-02-11 DIAGNOSIS — I10 Essential (primary) hypertension: Secondary | ICD-10-CM | POA: Diagnosis not present

## 2019-02-11 DIAGNOSIS — N183 Chronic kidney disease, stage 3 unspecified: Secondary | ICD-10-CM

## 2019-02-11 NOTE — Patient Instructions (Signed)
YOU CAN CALL TO MAKE AN ULTRASOUND..  I have put in an order for an ultrasound for you to have You can set them up at your convenience by calling this number [phone] You will likely have the ultrasound at [address]  If you have any issues call our office and we will set this up for you.  WE WILL SCHEDULE AN ULTRASOUND FOR YOU.   Fatty liver or Nonalcoholic fatty liver disease (NASH)  Now the leading cause of liver failure in the [place].  It is normally from such risk factors as obesity, diabetes, insulin resistance, high cholesterol, or metabolic syndrome.  The only definitive therapy is weight loss and exercise.    Suggest walking 20-30 mins daily.  Decreasing carbohydrates, increasing veggies.  Vitamin E 800 IU a day may be beneficial. Liver cancer has been noted in patient with fatty liver without cirrhosis.  Will monitor closely   Fatty Liver Fatty liver is the accumulation of fat in liver cells. It is also called hepatosteatosis or steatohepatitis. It is normal for your liver to contain some fat. If fat is more than 5 to 10% of your liver's weight, you have fatty liver.  There are often no symptoms (problems) for years while damage is still occurring. People often learn about their fatty liver when they have medical tests for other reasons. Fat can damage your liver for years or even decades without causing problems. When it becomes severe, it can cause fatigue, weight loss, weakness, and confusion. This makes you more likely to develop more serious liver problems. The liver is the largest organ in the body. It does a lot of work and often gives no warning signs when it is sick until late in a disease. The liver has many important jobs including:  Breaking down foods.  Storing vitamins, iron, and other minerals.  Making proteins.  Making bile for food digestion.  Breaking down many products including medications, alcohol and some poisons.   PROGNOSIS  Fatty liver may cause no damage or it can lead to an inflammation of the liver. This is, called steatohepatitis.  Over time the liver may become scarred and hardened. This condition is called cirrhosis. Cirrhosis is serious and may lead to liver failure or cancer. NASH is one of the leading causes of cirrhosis. About 10-20% of Americans have fatty liver and a smaller 2-5% has NASH.  TREATMENT   Weight loss, fat restriction, and exercise in overweight patients produces inconsistent results but is worth trying.  Good control of diabetes may reduce fatty liver.  Eat a balanced, healthy diet.  Increase your physical activity.  There are no medical or surgical treatments for a fatty liver or NASH, but improving your diet and increasing your exercise may help prevent or reverse some of the damage.  General eating tips  What to Avoid . Avoid added sugars o Often added sugar can be found in processed foods such as many condiments, dry cereals, cakes, cookies, chips, crisps, crackers, candies, sweetened drinks, etc.  o Read labels and AVOID/DECREASE use of foods with the following in their ingredient list: Sugar, fructose, high fructose corn syrup, sucrose, glucose, maltose, dextrose, molasses, cane sugar, brown sugar, any type of syrup, agave nectar, etc.   . Avoid snacking in between meals- drink water or if you feel you need a snack, pick a high water content snack such as cucumbers, watermelon, or any veggie.  Marland Kitchen Avoid foods made with flour  o If you are going to eat food made with flour, choose those made with whole-grains; and, minimize your consumption as much as is tolerable . Avoid processed foods o These foods are generally stocked in the middle of the grocery store.  o Focus on shopping on the perimeter of the grocery.  What to Include . Vegetables o GREEN LEAFY VEGETABLES: Kale, spinach, mustard greens, collard greens, cabbage, broccoli, etc. o OTHER: Asparagus,  cauliflower, eggplant, carrots, peas, Brussel sprouts, tomatoes, bell peppers, zucchini, beets, cucumbers, etc. . Grains, seeds, and legumes o Beans: kidney beans, black eyed peas, garbanzo beans, black beans, pinto beans, etc. o Whole, unrefined grains: brown rice, barley, bulgur, oatmeal, etc. . Healthy fats  o Avoid highly processed fats such as vegetable oil o Examples of healthy fats: avocado, olives, virgin olive oil, dark chocolate (?72% Cocoa), nuts (peanuts, almonds, walnuts, cashews, pecans, etc.) o Please still do small amount of these healthy fats, they are dense in calories.  . Low - Moderate Intake of Animal Sources of Protein o Meat sources: chicken, [place], salmon, tuna. Limit to 4 ounces of meat at one time or the size of your palm. o Consider limiting dairy sources, but when choosing dairy focus on: PLAIN [place] yogurt, cottage cheese, high-protein milk . Fruit o Choose berries

## 2019-02-12 LAB — COMPLETE METABOLIC PANEL WITH GFR
AG Ratio: 2.4 (calc) (ref 1.0–2.5)
ALT: 16 U/L (ref 6–29)
AST: 18 U/L (ref 10–35)
Albumin: 4.6 g/dL (ref 3.6–5.1)
Alkaline phosphatase (APISO): 55 U/L (ref 37–153)
BUN/Creatinine Ratio: 14 (calc) (ref 6–22)
BUN: 16 mg/dL (ref 7–25)
CO2: 27 mmol/L (ref 20–32)
Calcium: 10.1 mg/dL (ref 8.6–10.4)
Chloride: 104 mmol/L (ref 98–110)
Creat: 1.15 mg/dL — ABNORMAL HIGH (ref 0.60–0.93)
GFR, Est African American: 53 mL/min/{1.73_m2} — ABNORMAL LOW (ref >=60)
GFR, Est Non African American: 46 mL/min/{1.73_m2} — ABNORMAL LOW (ref >=60)
Globulin: 1.9 g/dL (calc) (ref 1.9–3.7)
Glucose, Bld: 98 mg/dL (ref 65–99)
Potassium: 4.3 mmol/L (ref 3.5–5.3)
Sodium: 139 mmol/L (ref 135–146)
Total Bilirubin: 0.8 mg/dL (ref 0.2–1.2)
Total Protein: 6.5 g/dL (ref 6.1–8.1)

## 2019-02-12 LAB — CBC WITH DIFFERENTIAL/PLATELET
Absolute Monocytes: 602 cells/uL (ref 200–950)
Basophils Absolute: 19 cells/uL (ref 0–200)
Basophils Relative: 0.3 %
Eosinophils Absolute: 102 cells/uL (ref 15–500)
Eosinophils Relative: 1.6 %
HCT: 42.1 % (ref 35.0–45.0)
Hemoglobin: 14.2 g/dL (ref 11.7–15.5)
Lymphs Abs: 3565 cells/uL (ref 850–3900)
MCH: 30.3 pg (ref 27.0–33.0)
MCHC: 33.7 g/dL (ref 32.0–36.0)
MCV: 89.8 fL (ref 80.0–100.0)
MPV: 11.4 fL (ref 7.5–12.5)
Monocytes Relative: 9.4 %
Neutro Abs: 2112 cells/uL (ref 1500–7800)
Neutrophils Relative %: 33 %
Platelets: 161 10*3/uL (ref 140–400)
RBC: 4.69 10*6/uL (ref 3.80–5.10)
RDW: 12.3 % (ref 11.0–15.0)
Total Lymphocyte: 55.7 %
WBC: 6.4 10*3/uL (ref 3.8–10.8)

## 2019-02-12 LAB — GAMMA GT: GGT: 20 U/L (ref 3–65)

## 2019-02-12 LAB — LIPID PANEL
Cholesterol: 155 mg/dL (ref <200)
HDL: 41 mg/dL — ABNORMAL LOW (ref >=50)
LDL Cholesterol (Calc): 91 mg/dL (calc)
Non-HDL Cholesterol (Calc): 114 mg/dL (calc) (ref <130)
Total CHOL/HDL Ratio: 3.8 (calc) (ref <5.0)
Triglycerides: 134 mg/dL (ref <150)

## 2019-02-12 LAB — HEMOGLOBIN A1C
Hgb A1c MFr Bld: 5.8 % of total Hgb — ABNORMAL HIGH (ref <5.7)
Mean Plasma Glucose: 120 (calc)
eAG (mmol/L): 6.6 (calc)

## 2019-02-12 LAB — MAGNESIUM: Magnesium: 2.1 mg/dL (ref 1.5–2.5)

## 2019-02-12 LAB — VITAMIN D 25 HYDROXY (VIT D DEFICIENCY, FRACTURES): Vit D, 25-Hydroxy: 68 ng/mL (ref 30–100)

## 2019-02-12 LAB — TSH: TSH: 2.64 mIU/L (ref 0.40–4.50)

## 2019-02-18 DIAGNOSIS — M8580 Other specified disorders of bone density and structure, unspecified site: Secondary | ICD-10-CM | POA: Diagnosis not present

## 2019-02-18 DIAGNOSIS — Z1231 Encounter for screening mammogram for malignant neoplasm of breast: Secondary | ICD-10-CM | POA: Diagnosis not present

## 2019-02-18 DIAGNOSIS — Z124 Encounter for screening for malignant neoplasm of cervix: Secondary | ICD-10-CM | POA: Diagnosis not present

## 2019-02-20 ENCOUNTER — Ambulatory Visit
Admission: RE | Admit: 2019-02-20 | Discharge: 2019-02-20 | Disposition: A | Discharge status: Home / Self Care | Payer: Medicare Other | Source: Ambulatory Visit | Attending: Physician Assistant | Admitting: Physician Assistant

## 2019-02-20 DIAGNOSIS — R1900 Intra-abdominal and pelvic swelling, mass and lump, unspecified site: Secondary | ICD-10-CM

## 2019-02-20 DIAGNOSIS — K760 Fatty (change of) liver, not elsewhere classified: Secondary | ICD-10-CM | POA: Diagnosis not present

## 2019-02-21 ENCOUNTER — Other Ambulatory Visit: Payer: Self-pay | Admitting: Physician Assistant

## 2019-02-21 DIAGNOSIS — R1900 Intra-abdominal and pelvic swelling, mass and lump, unspecified site: Secondary | ICD-10-CM

## 2019-02-21 DIAGNOSIS — K760 Fatty (change of) liver, not elsewhere classified: Secondary | ICD-10-CM

## 2019-02-21 DIAGNOSIS — R160 Hepatomegaly, not elsewhere classified: Secondary | ICD-10-CM

## 2019-03-06 DIAGNOSIS — M8580 Other specified disorders of bone density and structure, unspecified site: Secondary | ICD-10-CM | POA: Diagnosis not present

## 2019-03-06 DIAGNOSIS — M8589 Other specified disorders of bone density and structure, multiple sites: Secondary | ICD-10-CM | POA: Diagnosis not present

## 2019-03-06 LAB — HM DEXA SCAN

## 2019-03-13 DIAGNOSIS — K760 Fatty (change of) liver, not elsewhere classified: Secondary | ICD-10-CM | POA: Diagnosis not present

## 2019-03-13 DIAGNOSIS — R160 Hepatomegaly, not elsewhere classified: Secondary | ICD-10-CM | POA: Diagnosis not present

## 2019-03-15 ENCOUNTER — Other Ambulatory Visit: Payer: Self-pay | Admitting: Internal Medicine

## 2019-03-15 DIAGNOSIS — R160 Hepatomegaly, not elsewhere classified: Secondary | ICD-10-CM

## 2019-03-18 ENCOUNTER — Encounter: Payer: Self-pay | Admitting: Internal Medicine

## 2019-03-19 ENCOUNTER — Other Ambulatory Visit: Payer: Self-pay | Admitting: *Deleted

## 2019-03-19 MED ORDER — AMLODIPINE BESYLATE 5 MG PO TABS: 5.0000 mg | ORAL_TABLET | Freq: Every day | ORAL | 3 refills | Status: DC

## 2019-04-02 ENCOUNTER — Ambulatory Visit
Admission: RE | Admit: 2019-04-02 | Discharge: 2019-04-02 | Disposition: A | Discharge status: Home / Self Care | Payer: Medicare Other | Source: Ambulatory Visit | Attending: Internal Medicine | Admitting: Internal Medicine

## 2019-04-02 DIAGNOSIS — R160 Hepatomegaly, not elsewhere classified: Secondary | ICD-10-CM

## 2019-04-02 DIAGNOSIS — K760 Fatty (change of) liver, not elsewhere classified: Secondary | ICD-10-CM | POA: Diagnosis not present

## 2019-04-05 DIAGNOSIS — R0600 Dyspnea, unspecified: Secondary | ICD-10-CM

## 2019-04-05 HISTORY — DX: Dyspnea, unspecified: R06.00

## 2019-04-18 ENCOUNTER — Ambulatory Visit: Payer: Self-pay | Admitting: Physician Assistant

## 2019-04-23 ENCOUNTER — Encounter: Payer: Self-pay | Admitting: Physician Assistant

## 2019-05-14 ENCOUNTER — Ambulatory Visit: Payer: Self-pay | Admitting: Physician Assistant

## 2019-05-14 ENCOUNTER — Ambulatory Visit: Payer: Self-pay | Admitting: Adult Health Nurse Practitioner

## 2019-05-21 ENCOUNTER — Ambulatory Visit (INDEPENDENT_AMBULATORY_CARE_PROVIDER_SITE_OTHER): Payer: Medicare Other | Admitting: Adult Health Nurse Practitioner

## 2019-05-21 ENCOUNTER — Encounter: Payer: Self-pay | Admitting: Adult Health Nurse Practitioner

## 2019-05-21 ENCOUNTER — Other Ambulatory Visit: Payer: Self-pay

## 2019-05-21 VITALS — BP 122/62 | HR 77 | Temp 97.9°F | Ht 60.0 in | Wt 155.0 lb

## 2019-05-21 DIAGNOSIS — E785 Hyperlipidemia, unspecified: Secondary | ICD-10-CM

## 2019-05-21 DIAGNOSIS — K219 Gastro-esophageal reflux disease without esophagitis: Secondary | ICD-10-CM

## 2019-05-21 DIAGNOSIS — Z0001 Encounter for general adult medical examination with abnormal findings: Secondary | ICD-10-CM

## 2019-05-21 DIAGNOSIS — Z0000 Encounter for general adult medical examination without abnormal findings: Secondary | ICD-10-CM

## 2019-05-21 DIAGNOSIS — N183 Chronic kidney disease, stage 3 unspecified: Secondary | ICD-10-CM | POA: Diagnosis not present

## 2019-05-21 DIAGNOSIS — R7309 Other abnormal glucose: Secondary | ICD-10-CM

## 2019-05-21 DIAGNOSIS — M8580 Other specified disorders of bone density and structure, unspecified site: Secondary | ICD-10-CM

## 2019-05-21 DIAGNOSIS — I10 Essential (primary) hypertension: Secondary | ICD-10-CM | POA: Diagnosis not present

## 2019-05-21 DIAGNOSIS — R6889 Other general symptoms and signs: Secondary | ICD-10-CM | POA: Diagnosis not present

## 2019-05-21 DIAGNOSIS — E559 Vitamin D deficiency, unspecified: Secondary | ICD-10-CM | POA: Diagnosis not present

## 2019-05-21 DIAGNOSIS — Z79899 Other long term (current) drug therapy: Secondary | ICD-10-CM

## 2019-05-21 DIAGNOSIS — Z6830 Body mass index (BMI) 30.0-30.9, adult: Secondary | ICD-10-CM

## 2019-05-21 MED ORDER — AMLODIPINE BESYLATE 5 MG PO TABS: 5.0000 mg | ORAL_TABLET | Freq: Every day | ORAL | 3 refills | Status: DC

## 2019-05-21 NOTE — Patient Instructions (Signed)
We will call you in 1-3 days with your lab results.  Increase your water intake to 4-5 bottles a day.    Vit D  & Vit C 1,000 mg   are recommended to help protect  against the Covid-19 and other Corona viruses.    Also it's recommended  to take  Zinc 50 mg  to help  protect against the Covid-19   and best place to get  is also on [email or web address]  and don't pay more than 6-8 cents /pill !  ================================ Coronavirus (COVID-19) Are you at risk?  Are you at risk for the Coronavirus (COVID-19)?  To be considered HIGH RISK for Coronavirus (COVID-19), you have to meet the following criteria:  . Traveled to [place], [place], [place], [place] or [place]; or in the [place] to [place], [place], [place]  . or [place]; and have fever, cough, and shortness of breath within the last 2 weeks of travel OR . Been in close contact with a person diagnosed with COVID-19 within the last 2 weeks and have  . fever, cough,and shortness of breath .  . IF YOU DO NOT MEET THESE CRITERIA, YOU ARE CONSIDERED LOW RISK FOR COVID-19.  What to do if you are HIGH RISK for COVID-19?  Marland Kitchen If you are having a medical emergency, call 911. . Seek medical care right away. Before you go to a doctor's office, urgent care or emergency department, .  call ahead and tell them about your recent travel, contact with someone diagnosed with COVID-19  .  and your symptoms.  . You should receive instructions from your physician's office regarding next steps of care.  . When you arrive at healthcare provider, tell the healthcare staff immediately you have returned from  . visiting [place], [place], [place], [place] or [place]; or traveled in the [place] to [place], [place],  . [place] or [place] in the last two weeks or you have been in close contact with a person diagnosed with  . COVID-19 in the last 2 weeks.   . Tell the health care staff about your symptoms: fever, cough and shortness  of breath. . After you have been seen by a medical provider, you will be either: o Tested for (COVID-19) and discharged home on quarantine except to seek medical care if  o symptoms worsen, and asked to  - Stay home and avoid contact with others until you get your results (4-5 days)  - Avoid travel on public transportation if possible (such as bus, train, or airplane) or o Sent to the Emergency Department by EMS for evaluation, COVID-19 testing  and  o possible admission depending on your condition and test results.  What to do if you are LOW RISK for COVID-19?  Reduce your risk of any infection by using the same precautions used for avoiding the common cold or flu:  Marland Kitchen Wash your hands often with soap and warm water for at least 20 seconds.  If soap and water are not readily available,  . use an alcohol-based hand sanitizer with at least 60% alcohol.  . If coughing or sneezing, cover your mouth and nose by coughing or sneezing into the elbow areas of your shirt or coat, .  into a tissue or into your sleeve (not your hands). . Avoid shaking hands with others and consider head nods or verbal greetings only. . Avoid touching your eyes, nose, or mouth with unwashed hands.  . Avoid close contact  with people who are sick. . Avoid places or events with large numbers of people in one location, like concerts or sporting events. . Carefully consider travel plans you have or are making. . If you are planning any travel outside or inside the [place], visit the CDC's Travelers' Health webpage for the latest health notices. . If you have some symptoms but not all symptoms, continue to monitor at home and seek medical attention  . if your symptoms worsen. . If you are having a medical emergency, call 911.   . >>>>>>>>>>>>>>>>>>>>>>>>>>>>>>>>> . We Do NOT Approve of  [facility], [employer]  Our Patients  To Do Home Visits & We Do NOT Approve of [employer] > > > > > > > > > > >  > > > > > > > > > > > > > > > > > > > > > > > > > > > >  Preventive Care for Adults  A healthy lifestyle and preventive care can promote health and wellness. Preventive health guidelines for women include the following key practices.  A routine yearly physical is a good way to check with your health care provider about your health and preventive screening. It is a chance to share any concerns and updates on your health and to receive a thorough exam.  Visit your dentist for a routine exam and preventive care every 6 months. Brush your teeth twice a day and floss once a day. Good oral hygiene prevents tooth decay and gum disease.  The frequency of eye exams is based on your age, health, family medical history, use of contact lenses, and other factors. Follow your health care provider's recommendations for frequency of eye exams.  Eat a healthy diet. Foods like vegetables, fruits, whole grains, low-fat dairy products, and lean protein foods contain the nutrients you need without too many calories. Decrease your intake of foods high in solid fats, added sugars, and salt. Eat the right amount of calories for you. Get information about a proper diet from your health care provider, if necessary.  Regular physical exercise is one of the most important things you can do for your health. Most adults should get at least 150 minutes of moderate-intensity exercise (any activity that increases your heart rate and causes you to sweat) each week. In addition, most adults need muscle-strengthening exercises on 2 or more days a week.  Maintain a healthy weight. The body mass index (BMI) is a screening tool to identify possible weight problems. It provides an estimate of body fat based on height and weight. Your health care provider can find your BMI and can help you achieve or maintain a healthy weight. For adults 20 years and older:  A BMI below 18.5 is considered underweight.  A BMI of 18.5 to 24.9 is  normal.  A BMI of 25 to 29.9 is considered overweight.  A BMI of 30 and above is considered obese.  Maintain normal blood lipids and cholesterol levels by exercising and minimizing your intake of saturated fat. Eat a balanced diet with plenty of fruit and vegetables. If your lipid or cholesterol levels are high, you are over 50, or you are at high risk for heart disease, you may need your cholesterol levels checked more frequently. Ongoing high lipid and cholesterol levels should be treated with medicines if diet and exercise are not working.  If you smoke, find out from your health care provider how to quit. If you do not use  tobacco, do not start.  Lung cancer screening is recommended for adults aged 87-80 years who are at high risk for developing lung cancer because of a history of smoking. A yearly low-dose CT scan of the lungs is recommended for people who have at least a 30-pack-year history of smoking and are a current smoker or have quit within the past 15 years. A pack year of smoking is smoking an average of 1 pack of cigarettes a day for 1 year (for example: 1 pack a day for 30 years or 2 packs a day for 15 years). Yearly screening should continue until the smoker has stopped smoking for at least 15 years. Yearly screening should be stopped for people who develop a health problem that would prevent them from having lung cancer treatment.  Avoid use of street drugs. Do not share needles with anyone. Ask for help if you need support or instructions about stopping the use of drugs.  High blood pressure causes heart disease and increases the risk of stroke.  Ongoing high blood pressure should be treated with medicines if weight loss and exercise do not work.  If you are 65-66 years old, ask your health care provider if you should take aspirin to prevent strokes.  Diabetes screening involves taking a blood sample to check your fasting blood sugar level. This should be done once every 3 years,  after age 38, if you are within normal weight and without risk factors for diabetes. Testing should be considered at a younger age or be carried out more frequently if you are overweight and have at least 1 risk factor for diabetes.  Breast cancer screening is essential preventive care for women. You should practice "breast self-awareness." This means understanding the normal appearance and feel of your breasts and may include breast self-examination. Any changes detected, no matter how small, should be reported to a health care provider. Women in their 34s and 30s should have a clinical breast exam (CBE) by a health care provider as part of a regular health exam every 1 to 3 years. After age 26, women should have a CBE every year. Starting at age 54, women should consider having a mammogram (breast X-ray test) every year. Women who have a family history of breast cancer should talk to their health care provider about genetic screening. Women at a high risk of breast cancer should talk to their health care providers about having an MRI and a mammogram every year.  Breast cancer gene (BRCA)-related cancer risk assessment is recommended for women who have family members with BRCA-related cancers. BRCA-related cancers include breast, ovarian, tubal, and peritoneal cancers. Having family members with these cancers may be associated with an increased risk for harmful changes (mutations) in the breast cancer genes BRCA1 and BRCA2. Results of the assessment will determine the need for genetic counseling and BRCA1 and BRCA2 testing.  Routine pelvic exams to screen for cancer are no longer recommended for nonpregnant women who are considered low risk for cancer of the pelvic organs (ovaries, uterus, and vagina) and who do not have symptoms. Ask your health care provider if a screening pelvic exam is right for you.  If you have had past treatment for cervical cancer or a condition that could lead to cancer, you need  Pap tests and screening for cancer for at least 20 years after your treatment. If Pap tests have been discontinued, your risk factors (such as having a new sexual partner) need to be reassessed to determine  if screening should be resumed. Some women have medical problems that increase the chance of getting cervical cancer. In these cases, your health care provider may recommend more frequent screening and Pap tests.    Colorectal cancer can be detected and often prevented. Most routine colorectal cancer screening begins at the age of 49 years and continues through age 29 years. However, your health care provider may recommend screening at an earlier age if you have risk factors for colon cancer. On a yearly basis, your health care provider may provide home test kits to check for hidden blood in the stool. Use of a small camera at the end of a tube, to directly examine the colon (sigmoidoscopy or colonoscopy), can detect the earliest forms of colorectal cancer. Talk to your health care provider about this at age 67, when routine screening begins.  Direct exam of the colon should be repeated every 5-10 years through age 24 years, unless early forms of pre-cancerous polyps or small growths are found.  Osteoporosis is a disease in which the bones lose minerals and strength with aging. This can result in serious bone fractures or breaks. The risk of osteoporosis can be identified using a bone density scan. Women ages 40 years and over and women at risk for fractures or osteoporosis should discuss screening with their health care providers. Ask your health care provider whether you should take a calcium supplement or vitamin D to reduce the rate of osteoporosis.  Menopause can be associated with physical symptoms and risks. Hormone replacement therapy is available to decrease symptoms and risks. You should talk to your health care provider about whether hormone replacement therapy is right for you.  Use  sunscreen. Apply sunscreen liberally and repeatedly throughout the day. You should seek shade when your shadow is shorter than you. Protect yourself by wearing long sleeves, pants, a wide-brimmed hat, and sunglasses year round, whenever you are outdoors.  Once a month, do a whole body skin exam, using a mirror to look at the skin on your back. Tell your health care provider of new moles, moles that have irregular borders, moles that are larger than a pencil eraser, or moles that have changed in shape or color.  Stay current with required vaccines (immunizations).  Influenza vaccine. All adults should be immunized every year.  Tetanus, diphtheria, and acellular pertussis (Td, Tdap) vaccine. Pregnant women should receive 1 dose of Tdap vaccine during each pregnancy. The dose should be obtained regardless of the length of time since the last dose. Immunization is preferred during the 27th-36th week of gestation. An adult who has not previously received Tdap or who does not know her vaccine status should receive 1 dose of Tdap. This initial dose should be followed by tetanus and diphtheria toxoids (Td) booster doses every 10 years. Adults with an unknown or incomplete history of completing a 3-dose immunization series with Td-containing vaccines should begin or complete a primary immunization series including a Tdap dose. Adults should receive a Td booster every 10 years.    Zoster vaccine. One dose is recommended for adults aged 62 years or older unless certain conditions are present.    Pneumococcal 13-valent conjugate (PCV13) vaccine. When indicated, a person who is uncertain of her immunization history and has no record of immunization should receive the PCV13 vaccine. An adult aged 21 years or older who has certain medical conditions and has not been previously immunized should receive 1 dose of PCV13 vaccine. This PCV13 should be followed with  a dose of pneumococcal polysaccharide (PPSV23) vaccine.  The PPSV23 vaccine dose should be obtained at least 1 or more year(s) after the dose of PCV13 vaccine. An adult aged 81 years or older who has certain medical conditions and previously received 1 or more doses of PPSV23 vaccine should receive 1 dose of PCV13. The PCV13 vaccine dose should be obtained 1 or more years after the last PPSV23 vaccine dose.    Pneumococcal polysaccharide (PPSV23) vaccine. When PCV13 is also indicated, PCV13 should be obtained first. All adults aged 70 years and older should be immunized. An adult younger than age 48 years who has certain medical conditions should be immunized. Any person who resides in a nursing home or long-term care facility should be immunized. An adult smoker should be immunized. People with an immunocompromised condition and certain other conditions should receive both PCV13 and PPSV23 vaccines. People with human immunodeficiency virus (HIV) infection should be immunized as soon as possible after diagnosis. Immunization during chemotherapy or radiation therapy should be avoided. Routine use of PPSV23 vaccine is not recommended for American Indians, [place] Natives, or people younger than 65 years unless there are medical conditions that require PPSV23 vaccine. When indicated, people who have unknown immunization and have no record of immunization should receive PPSV23 vaccine. One-time revaccination 5 years after the first dose of PPSV23 is recommended for people aged 19-64 years who have chronic kidney failure, nephrotic syndrome, asplenia, or immunocompromised conditions. People who received 1-2 doses of PPSV23 before age 61 years should receive another dose of PPSV23 vaccine at age 77 years or later if at least 5 years have passed since the previous dose. Doses of PPSV23 are not needed for people immunized with PPSV23 at or after age 96 years.   Preventive Services / Frequency  Ages 26 years and over  Blood pressure check.  Lipid and cholesterol  check.  Lung cancer screening. / Every year if you are aged 52-80 years and have a 30-pack-year history of smoking and currently smoke or have quit within the past 15 years. Yearly screening is stopped once you have quit smoking for at least 15 years or develop a health problem that would prevent you from having lung cancer treatment.  Clinical breast exam.** / Every year after age 28 years.   BRCA-related cancer risk assessment.** / For women who have family members with a BRCA-related cancer (breast, ovarian, tubal, or peritoneal cancers).  Mammogram.** / Every year beginning at age 19 years and continuing for as long as you are in good health. Consult with your health care provider.  Pap test.** / Every 3 years starting at age 14 years through age 29 or 23 years with 3 consecutive normal Pap tests. Testing can be stopped between 65 and 70 years with 3 consecutive normal Pap tests and no abnormal Pap or HPV tests in the past 10 years.  Fecal occult blood test (FOBT) of stool. / Every year beginning at age 58 years and continuing until age 65 years. You may not need to do this test if you get a colonoscopy every 10 years.  Flexible sigmoidoscopy or colonoscopy.** / Every 5 years for a flexible sigmoidoscopy or every 10 years for a colonoscopy beginning at age 66 years and continuing until age 42 years.  Hepatitis C blood test.** / For all people born from [birth year] through 1965 and any individual with known risks for hepatitis C.  Osteoporosis screening.** / A one-time screening for women ages 70 years and over  and women at risk for fractures or osteoporosis.  Skin self-exam. / Monthly.  Influenza vaccine. / Every year.  Tetanus, diphtheria, and acellular pertussis (Tdap/Td) vaccine.** / 1 dose of Td every 10 years.  Zoster vaccine.** / 1 dose for adults aged 38 years or older.  Pneumococcal 13-valent conjugate (PCV13) vaccine.** / Consult your health care provider.  Pneumococcal  polysaccharide (PPSV23) vaccine.** / 1 dose for all adults aged 15 years and older. Screening for abdominal aortic aneurysm (AAA)  by ultrasound is recommended for people who have history of high blood pressure or who are current or former smokers. ++++++++++++++++++++ Recommend Adult Low Dose Aspirin or  coated  Aspirin 81 mg daily  To reduce risk of Colon Cancer 40 %,  Skin Cancer 26 % ,  Melanoma 46%  and  Pancreatic cancer 60% ++++++++++++++++++++ Vitamin D goal  is between 70-100.  Please make sure that you are taking your Vitamin D as directed.  It is very important as a natural anti-inflammatory  helping hair, skin, and nails, as well as reducing stroke and heart attack risk.  It helps your bones and helps with mood. It also decreases numerous cancer risks so please take it as directed.  Low Vit D is associated with a 200-300% higher risk for CANCER  and 200-300% higher risk for HEART   ATTACK  &  STROKE.   .....................................Marland Kitchen It is also associated with higher death rate at younger ages,  autoimmune diseases like Rheumatoid arthritis, Lupus, Multiple Sclerosis.    Also many other serious conditions, like depression, Alzheimer's Dementia, infertility, muscle aches, fatigue, fibromyalgia - just to name a few. ++++++++++++++++++ Recommend the book "The END of DIETING" by Dr Excell Seltzer  & the book "The END of DIABETES " by Dr Excell Seltzer At [email or web address] - get book & Audio CD's    Being diabetic has a  300% increased risk for heart attack, stroke, cancer, and alzheimer- type vascular dementia. It is very important that you work harder with diet by avoiding all foods that are white. Avoid white rice (brown & wild rice is OK), white potatoes (sweetpotatoes in moderation is OK), White bread or wheat bread or anything made out of white flour like bagels, donuts, rolls, buns, biscuits, cakes, pastries, cookies, pizza crust, and pasta (made from white flour & egg whites)  - vegetarian pasta or spinach or wheat pasta is OK. Multigrain breads like Arnold's or [employer], or multigrain sandwich thins or flatbreads.  Diet, exercise and weight loss can reverse and cure diabetes in the early stages.  Diet, exercise and weight loss is very important in the control and prevention of complications of diabetes which affects every system in your body, ie. Brain - dementia/stroke, eyes - glaucoma/blindness, heart - heart attack/heart failure, kidneys - dialysis, stomach - gastric paralysis, intestines - malabsorption, nerves - severe painful neuritis, circulation - gangrene & loss of a leg(s), and finally cancer and Alzheimers.    I recommend avoid fried & greasy foods,  sweets/candy, white rice (brown or wild rice or Quinoa is OK), white potatoes (sweet potatoes are OK) - anything made from white flour - bagels, doughnuts, rolls, buns, biscuits,white and wheat breads, pizza crust and traditional pasta made of white flour & egg white(vegetarian pasta or spinach or wheat pasta is OK).  Multi-grain bread is OK - like multi-grain flat bread or sandwich thins. Avoid alcohol in excess. Exercise is also important.    Eat all the vegetables you want - avoid meat,  especially red meat and dairy - especially cheese.  Cheese is the most concentrated form of trans-fats which is the worst thing to clog up our arteries. Veggie cheese is OK which can be found in the fresh produce section at [employer] or [employer] or [employer]  +++++++++++++++++++ DASH Eating Plan  DASH stands for "Dietary Approaches to Stop Hypertension."   The DASH eating plan is a healthy eating plan that has been shown to reduce high blood pressure (hypertension). Additional health benefits may include reducing the risk of type 2 diabetes mellitus, heart disease, and stroke. The DASH eating plan may also help with weight loss. WHAT DO I NEED TO KNOW ABOUT THE DASH EATING PLAN? For the DASH eating plan, you will  follow these general guidelines:  Choose foods with a percent daily value for sodium of less than 5% (as listed on the food label).  Use salt-free seasonings or herbs instead of table salt or sea salt.  Check with your health care provider or pharmacist before using salt substitutes.  Eat lower-sodium products, often labeled as "lower sodium" or "no salt added."  Eat fresh foods.  Eat more vegetables, fruits, and low-fat dairy products.  Choose whole grains. Look for the word "whole" as the first word in the ingredient list.  Choose fish   Limit sweets, desserts, sugars, and sugary drinks.  Choose heart-healthy fats.  Eat veggie cheese   Eat more home-cooked food and less restaurant, buffet, and fast food.  Limit fried foods.  Cook foods using methods other than frying.  Limit canned vegetables. If you do use them, rinse them well to decrease the sodium.  When eating at a restaurant, ask that your food be prepared with less salt, or no salt if possible.                      WHAT FOODS CAN I EAT? Read Dr Fara Olden Fuhrman's books on The End of Dieting & The End of Diabetes  Grains Whole grain or whole wheat bread. Brown rice. Whole grain or whole wheat pasta. Quinoa, bulgur, and whole grain cereals. Low-sodium cereals. Corn or whole wheat flour tortillas. Whole grain cornbread. Whole grain crackers. Low-sodium crackers.  Vegetables Fresh or frozen vegetables (raw, steamed, roasted, or grilled). Low-sodium or reduced-sodium tomato and vegetable juices. Low-sodium or reduced-sodium tomato sauce and paste. Low-sodium or reduced-sodium canned vegetables.   Fruits All fresh, canned (in natural juice), or frozen fruits.  Protein Products  All fish and seafood.  Dried beans, peas, or lentils. Unsalted nuts and seeds. Unsalted canned beans.  Dairy Low-fat dairy products, such as skim or 1% milk, 2% or reduced-fat cheeses, low-fat ricotta or cottage cheese, or plain low-fat yogurt.  Low-sodium or reduced-sodium cheeses.  Fats and Oils Tub margarines without trans fats. Light or reduced-fat mayonnaise and salad dressings (reduced sodium). Avocado. Safflower, olive, or canola oils. Natural peanut or almond butter.  Other Unsalted popcorn and pretzels. The items listed above may not be a complete list of recommended foods or beverages. Contact your dietitian for more options.  +++++++++++++++  WHAT FOODS ARE NOT RECOMMENDED? Grains/ White flour or wheat flour White bread. White pasta. White rice. Refined cornbread. Bagels and croissants. Crackers that contain trans fat.  Vegetables  Creamed or fried vegetables. Vegetables in a . Regular canned vegetables. Regular canned tomato sauce and paste. Regular tomato and vegetable juices.  Fruits Dried fruits. Canned fruit in light or heavy syrup. Fruit juice.  Meat and  Other Protein Products Meat in general - RED meat & White meat.  Fatty cuts of meat. Ribs, chicken wings, all processed meats as bacon, sausage, bologna, salami, fatback, hot dogs, bratwurst and packaged luncheon meats.  Dairy Whole or 2% milk, cream, half-and-half, and cream cheese. Whole-fat or sweetened yogurt. Full-fat cheeses or blue cheese. Non-dairy creamers and whipped toppings. Processed cheese, cheese spreads, or cheese curds.  Condiments Onion and garlic salt, seasoned salt, table salt, and sea salt. Canned and packaged gravies. Worcestershire sauce. Tartar sauce. Barbecue sauce. Teriyaki sauce. Soy sauce, including reduced sodium. Steak sauce. Fish sauce. Oyster sauce. Cocktail sauce. Horseradish. Ketchup and mustard. Meat flavorings and tenderizers. Bouillon cubes. Hot sauce. Tabasco sauce. Marinades. Taco seasonings. Relishes.  Fats and Oils Butter, stick margarine, lard, shortening and bacon fat. Coconut, palm kernel, or palm oils. Regular salad dressings.  Pickles and olives. Salted popcorn and pretzels.  The items listed above may not be  a complete list of foods and beverages to avoid.

## 2019-05-21 NOTE — Progress Notes (Signed)
MEDICARE ANNUAL WELLNESS VISIT AND 3 MONTH FOLLOW UP  Assessment:   Encounter for Medicare annual wellness exam [place]  Essential hypertension Continue current medications: Norvasc 5mg  Monitor blood pressure at home; call if consistently over 130/80 Continue DASH diet.   Reminder to go to the ER if any CP, SOB, nausea, dizziness, severe HA, changes vision/speech, left arm numbness and tingling and jaw pain. -     CBC with Differential/Platelet -     BASIC METABOLIC PANEL WITH GFR   Hyperlipidemia, unspecified hyperlipidemia type Continue medications: Discussed dietary and exercise modifications Low fat diet -     Lipid panel  CKD (chronic kidney disease) stage 3, GFR 30-59 ml/min (HCC) Increase fluids  Avoid NSAIDS Blood pressure control Monitor sugars  Will continue to monitor -     BASIC METABOLIC PANEL WITH GFR  Gastroesophageal reflux disease, esophagitis presence not specified Doing well at this time Diet discussed, PRN OTC if needed Monitor for triggers Avoid food with high acid content Avoid excessive cafeine Increase water intake  Vitamin D deficiency Continue supplementation Taking Vitamin D 4,000 IU daily  Osteopenia, unspecified location Osteopenia- DEXA UTD Continue Vit D and Ca, weight bearing exercises Continue to monitor  Abnormal Glucose Discussed disease progression and risks Discussed diet/exercise, weight management and risk modification  BMI 30 - follow up 4 months for progress monitoring - increase veggies, decrease carbs - long discussion about weight loss, diet, and exercise  Medication management Continued  Over 40 minutes of interview, exam, counseling, chart review, and critical decision making was performed Future Appointments  Date Time Provider [place]  12/12/2019 11:00 AM Unk Pinto, MD [facility] None    Plan:   During the course of the visit the patient was educated and counseled about appropriate  screening and preventive services including:    Pneumococcal vaccine   Influenza vaccine  Td vaccine  Prevnar 13  Screening electrocardiogram  Screening mammography  Bone densitometry screening  Colorectal cancer screening  Diabetes screening  Glaucoma screening  Nutrition counseling   Advanced directives: given info/requested copies  Subjective:   Jacqueline Hancock is a 79 y.o. AA female who presents for Medicare Annual Wellness Visit and 3 month follow up on HTN, HLD, GERD, abnormal glucose, weight and vitamin D def.   She reports overall she is doing well today and has no health or medication concerns.  BMI is Body mass index is 30.27 kg/m., she is working on diet and exercise. Going to start [facility] Monday.  Wt Readings from Last 3 Encounters:  05/21/19 155 lb (70.3 kg)  02/11/19 162 lb 9.6 oz (73.8 kg)  11/05/18 161 lb 6.4 oz (73.2 kg)    Her blood pressure has been controlled at home, today their BP is BP: 122/62 She does workout, does it in her house. She denies chest pain, shortness of breath, dizziness.  She is not on cholesterol medication and denies myalgias. Her cholesterol is at goal. The cholesterol last visit was:   Lab Results  Component Value Date   CHOL 155 02/11/2019   HDL 41 (L) 02/11/2019   LDLCALC 91 02/11/2019   TRIG 134 02/11/2019   CHOLHDL 3.8 02/11/2019   She has been working on diet and exercise for prediabetes, and denies paresthesia of the feet, polydipsia, polyuria and visual disturbances. Last A1C in the office was:  Lab Results  Component Value Date   HGBA1C 5.8 (H) 02/11/2019   Lab Results  Component Value Date   GFRAA  53 (L) 02/11/2019   Patient is on Vitamin D supplement. Lab Results  Component Value Date   VD25OH 68 02/11/2019      Medication Review Current Outpatient Medications on File Prior to Visit  Medication Sig Dispense Refill  . Ascorbic Acid (VITAMIN C) 1000 MG tablet Take 1,000 mg by mouth daily.    .  Cholecalciferol (VITAMIN D) 2000 UNITS tablet Take 4,000 Units by mouth daily.     . Ferrous Sulfate (IRON) 325 (65 Fe) MG TABS Take by mouth daily.     No current facility-administered medications on file prior to visit.    Current Problems (verified) Patient Active Problem List   Diagnosis Date Noted  . CKD (chronic kidney disease) stage 3, GFR 30-59 ml/min 09/10/2015  . Vitamin D deficiency 06/20/2013  . Medication management 06/20/2013  . Hypertension   . Hyperlipidemia   . Abnormal glucose   . GERD (gastroesophageal reflux disease)   . Osteopenia     Screening Tests Immunization History  Administered Date(s) Administered  . Influenza, High Dose Seasonal PF 01/28/2015, 12/11/2015, 01/25/2017, 01/22/2018, 01/10/2019, 02/03/2019  . Influenza-Unspecified 01/29/2013, 12/27/2013, 01/25/2017  . PFIZER SARS-COV-2 Vaccination 05/11/2019  . PPD Test 02/25/2013  . Pneumococcal Conjugate-13 01/24/2017  . Pneumococcal Polysaccharide-23 02/25/2013  . Td 09/13/2001, 02/20/2012  . Tdap 02/25/2013  . Zoster 10/23/2006   Preventative care: Last colonoscopy:  2019 Last mammogram: 12/20120 [facility] Last pap smear/pelvic exam: 2015  DEXA: 03/2019 osteopenia CXR 2012  Prior vaccinations: TD or Tdap: 2014  Influenza:  2018 Pneumococcal: 2014 Prevnar13: 2018 Shingles/Zostavax: 2008  COVID: 05/11/2019, Second schedule 05/27/2019  Names of Other Physician/Practitioners you currently use: 1. [facility]- here for primary care 2. Dr. Gershon Crane, eye doctor, last visit May 2018 3. Has partial, dentist, last visit 2 years Patient Care Team: Unk Pinto, MD as PCP - General (Internal Medicine) Jodi Marble, MD as Consulting Physician (Otolaryngology) Richmond Campbell, MD as Consulting Physician (Gastroenterology) Leo Grosser, Seymour Bars, MD (Inactive) as Consulting Physician (Obstetrics and Gynecology) Rutherford Guys, MD as Consulting  Physician (Ophthalmology)  Allergies Allergies  Allergen Reactions  . Nexium [Esomeprazole Magnesium] Other (See Comments)    Headache    SURGICAL HISTORY She  has a past surgical history that includes Parotid gland tumor excision (2012); Abdominal hysterectomy; Appendectomy; Tonsillectomy and adenoidectomy; and Cataract extraction, bilateral (Bilateral, 09/2017). FAMILY HISTORY Her family history includes Diabetes in her brother, brother, and sister; Heart disease in her brother; Hypertension in her father; Leukemia in her mother. SOCIAL HISTORY She  reports that she has never smoked. She has never used smokeless tobacco. She reports that she does not drink alcohol.  MEDICARE WELLNESS OBJECTIVES: Physical activity: Current Exercise Habits: Home exercise routine, Type of exercise: stretching, Time (Minutes): 15, Frequency (Times/Week): 7, Weekly Exercise (Minutes/Week): 105, Intensity: Mild Cardiac risk factors: Cardiac Risk Factors include: advanced age (>61men, >82 women);hypertension Depression/mood screen:   Depression screen [facility] 2/9 05/21/2019  Decreased Interest 0  Down, Depressed, Hopeless 0  PHQ - 2 Score 0    ADLs:  In your present state of health, do you have any difficulty performing the following activities: 05/21/2019 11/05/2018  Hearing? N N  Vision? N N  Difficulty concentrating or making decisions? N N  Walking or climbing stairs? N N  Dressing or bathing? N N  Doing errands, shopping? N N  Preparing Food and eating ? N -  Using the Toilet? N -  In the past six months, have  you accidently leaked urine? N -  Do you have problems with loss of bowel control? N -  Managing your Medications? N -  Managing your Finances? N -  Housekeeping or managing your Housekeeping? N -  Some recent data might be hidden     Cognitive Testing  Alert? Yes  Normal Appearance?Yes  Oriented to person? Yes  Place? Yes   Time? Yes  Recall of three objects?  Yes  Can perform simple  calculations? Yes  Displays appropriate judgment?Yes  Can read the correct time from a watch face?Yes  EOL planning: Does Patient Have a Medical Advance Directive?: Yes Type of Advance Directive: Healthcare Power of Attorney, Living will Does patient want to make changes to medical advance directive?: No - Patient declined   Objective:   Today's Vitals   05/21/19 1055  BP: 122/62  Pulse: 77  Temp: 97.9 F (36.6 C)  SpO2: 99%  Weight: 155 lb (70.3 kg)  Height: 5' (1.524 m)   Body mass index is 30.27 kg/m.  General appearance: alert, no distress, WD/WN,  female HEENT: normocephalic, sclerae anicteric, TMs pearly, nares patent, no discharge or erythema, pharynx normal Oral cavity: MMM, no lesions Neck: supple, no lymphadenopathy, no thyromegaly, no masses Heart: RRR, normal S1, S2, no murmurs Lungs: CTA bilaterally, no wheezes, rhonchi, or rales Abdomen: +bs, soft, + epigastric tender, non distended, no masses, no hepatomegaly, no splenomegaly Musculoskeletal: nontender, no swelling, no obvious deformity Extremities: no edema, no cyanosis, no clubbing Pulses: 2+ symmetric, upper and lower extremities, normal cap refill Neurological: alert, oriented x 3, CN2-12 intact, strength normal upper extremities and lower extremities, sensation normal throughout, DTRs 2+ throughout, no cerebellar signs, gait normal Psychiatric: normal affect, behavior normal, pleasant  Skin: seb cyst right post auricular, no lymphadenopathy Breast: defer Gyn: defer  Medicare Attestation I have personally reviewed: The patient's medical and social history Their use of alcohol, tobacco or illicit drugs Their current medications and supplements The patient's functional ability including ADLs,fall risks, home safety risks, cognitive, and hearing and visual impairment Diet and physical activities Evidence for depression or mood disorders  The patient's weight, height, BMI, and visual acuity have been  recorded in the chart.  I have made referrals, counseling, and provided education to the patient based on review of the above and I have provided the patient with a written personalized care plan for preventive services.     Garnet Sierras, NP   05/27/2019

## 2019-05-22 LAB — COMPLETE METABOLIC PANEL WITH GFR
AG Ratio: 2.2 (calc) (ref 1.0–2.5)
ALT: 15 U/L (ref 6–29)
AST: 17 U/L (ref 10–35)
Albumin: 4.7 g/dL (ref 3.6–5.1)
Alkaline phosphatase (APISO): 66 U/L (ref 37–153)
BUN/Creatinine Ratio: 18 (calc) (ref 6–22)
BUN: 19 mg/dL (ref 7–25)
CO2: 27 mmol/L (ref 20–32)
Calcium: 10.2 mg/dL (ref 8.6–10.4)
Chloride: 104 mmol/L (ref 98–110)
Creat: 1.07 mg/dL — ABNORMAL HIGH (ref 0.60–0.93)
GFR, Est African American: 58 mL/min/{1.73_m2} — ABNORMAL LOW (ref >=60)
GFR, Est Non African American: 50 mL/min/{1.73_m2} — ABNORMAL LOW (ref >=60)
Globulin: 2.1 g/dL (calc) (ref 1.9–3.7)
Glucose, Bld: 102 mg/dL — ABNORMAL HIGH (ref 65–99)
Potassium: 4.3 mmol/L (ref 3.5–5.3)
Sodium: 140 mmol/L (ref 135–146)
Total Bilirubin: 0.9 mg/dL (ref 0.2–1.2)
Total Protein: 6.8 g/dL (ref 6.1–8.1)

## 2019-05-22 LAB — CBC WITH DIFFERENTIAL/PLATELET
Absolute Monocytes: 845 cells/uL (ref 200–950)
Basophils Absolute: 33 cells/uL (ref 0–200)
Basophils Relative: 0.5 %
Eosinophils Absolute: 99 cells/uL (ref 15–500)
Eosinophils Relative: 1.5 %
HCT: 40.8 % (ref 35.0–45.0)
Hemoglobin: 13.9 g/dL (ref 11.7–15.5)
Lymphs Abs: 3241 cells/uL (ref 850–3900)
MCH: 29.8 pg (ref 27.0–33.0)
MCHC: 34.1 g/dL (ref 32.0–36.0)
MCV: 87.6 fL (ref 80.0–100.0)
MPV: 11.5 fL (ref 7.5–12.5)
Monocytes Relative: 12.8 %
Neutro Abs: 2383 cells/uL (ref 1500–7800)
Neutrophils Relative %: 36.1 %
Platelets: 134 10*3/uL — ABNORMAL LOW (ref 140–400)
RBC: 4.66 10*6/uL (ref 3.80–5.10)
RDW: 12.7 % (ref 11.0–15.0)
Total Lymphocyte: 49.1 %
WBC: 6.6 10*3/uL (ref 3.8–10.8)

## 2019-05-22 LAB — LIPID PANEL
Cholesterol: 150 mg/dL (ref <200)
HDL: 41 mg/dL — ABNORMAL LOW (ref >=50)
LDL Cholesterol (Calc): 87 mg/dL (calc)
Non-HDL Cholesterol (Calc): 109 mg/dL (calc) (ref <130)
Total CHOL/HDL Ratio: 3.7 (calc) (ref <5.0)
Triglycerides: 120 mg/dL (ref <150)

## 2019-05-22 LAB — MAGNESIUM: Magnesium: 2.2 mg/dL (ref 1.5–2.5)

## 2019-07-01 NOTE — Progress Notes (Signed)
Assessment and Plan:  Jacqueline Hancock was seen today for sciatica.  Diagnoses and all orders for this visit:  Acute pain of left shoulder -   meloxicam (MOBIC) 15 MG tablet; Take one daily with food for 2 weeks, can take with tylenol, can not take with aleve, iburpofen, then as needed daily for pain  Sciatic nerve pain, right Paresthesias -  meloxicam (MOBIC) 15 MG tablet; Take one daily with food for 2 weeks, can take with tylenol, can not take with aleve, iburpofen, then as needed daily for pain  -     gabapentin (NEURONTIN) 100 MG capsule; Take 1 capsule (100 mg total) by mouth 3 (three) times daily. As needed for nerve pain. Take at bedtimes to help with pain.  Discussed medications and side effects.  May take during the day.  Know how medication effect you before driving, can make drowsy.  Contact office with new or worsening symptoms.  Consider imaging? Ortho/nuero consultations     Further disposition pending results of labs. Discussed med's effects and SE's.   Over 30 minutes of face to face interview exam, counseling, chart review, and critical decision making was performed.   Future Appointments  Date Time Provider [place]  12/12/2019 11:00 AM Unk Pinto, MD [facility] None    ------------------------------------------------------------------------------------------------------------------   HPI 79 y.o.female presents for pain that is throbbing in her right leg and left arm.  She said it happens mostly at night but also during the day. He pain starts at hip all shoots down her leg.  Feels like something is crawling under her skin and it is worse at night. She has tried cy hot, patches.  Heat makes it worse.  Nothing makes it better.  It started a couple months ago.  Denies any tingling in her feet, falls, near misses, muscle weakness or trauma. She reports left arm pain that is tingling crawling under her skin that is intermittent.  Denies any numbness tingling in  finger tips.  Denies any shoulder pain, muscle weakness or trauma.     Past Medical History:  Diagnosis Date  . Early cataracts, bilateral   . Elevated hemoglobin A1c   . Fibroids   . GERD (gastroesophageal reflux disease)   . Hyperlipidemia   . Hypertension   . Osteopenia      Allergies  Allergen Reactions  . Nexium [Esomeprazole Magnesium] Other (See Comments)    Headache    Current Outpatient Medications on File Prior to Visit  Medication Sig  . amLODipine (NORVASC) 5 MG tablet Take 1 tablet (5 mg total) by mouth daily.  . Ascorbic Acid (VITAMIN C) 1000 MG tablet Take 1,000 mg by mouth daily.  . Cholecalciferol (VITAMIN D) 2000 UNITS tablet Take 4,000 Units by mouth daily.   . Ferrous Sulfate (IRON) 325 (65 Fe) MG TABS Take by mouth daily.   No current facility-administered medications on file prior to visit.    ROS: all negative except above.   Physical Exam:  BP 130/66   Pulse 74   Temp (!) 97.5 F (36.4 C)   Wt 154 lb 9.6 oz (70.1 kg)   SpO2 98%   BMI 30.19 kg/m   General Appearance: Well nourished, in no apparent distress. Eyes: PERRLA, EOMs, conjunctiva no swelling or erythema Sinuses: No Frontal/maxillary tenderness Neck: Supple, thyroid normal.  Respiratory: Respiratory effort normal, BS equal bilaterally without rales, rhonchi, wheezing or stridor.  Cardio: RRR with no MRGs. Brisk peripheral pulses without edema.  Abdomen: Soft, + BS.  Non tender, no guarding, rebound, hernias, masses. Lymphatics: Non tender without lymphadenopathy.  Musculoskeletal: Full ROM, 5/5 strength, normal gait. Skin: Warm, dry without rashes, lesions, ecchymosis.  Neuro: Cranial nerves intact. Normal muscle tone, no cerebellar symptoms. Sensation intact.  Psych: Awake and oriented X 3, normal affect, Insight and Judgment appropriate.     Garnet Sierras, NP 12:31 PM [facility]

## 2019-07-02 ENCOUNTER — Ambulatory Visit (INDEPENDENT_AMBULATORY_CARE_PROVIDER_SITE_OTHER): Payer: Medicare Other | Admitting: Adult Health Nurse Practitioner

## 2019-07-02 ENCOUNTER — Other Ambulatory Visit: Payer: Self-pay

## 2019-07-02 ENCOUNTER — Encounter: Payer: Self-pay | Admitting: Adult Health Nurse Practitioner

## 2019-07-02 ENCOUNTER — Other Ambulatory Visit: Payer: Self-pay | Admitting: Adult Health Nurse Practitioner

## 2019-07-02 VITALS — BP 130/66 | HR 74 | Temp 97.5°F | Wt 154.6 lb

## 2019-07-02 DIAGNOSIS — M25512 Pain in left shoulder: Secondary | ICD-10-CM | POA: Diagnosis not present

## 2019-07-02 DIAGNOSIS — R202 Paresthesia of skin: Secondary | ICD-10-CM | POA: Diagnosis not present

## 2019-07-02 DIAGNOSIS — M5431 Sciatica, right side: Secondary | ICD-10-CM

## 2019-07-02 MED ORDER — GABAPENTIN 100 MG PO CAPS: 100.0000 mg | ORAL_CAPSULE | Freq: Three times a day (TID) | ORAL | 2 refills | Status: DC

## 2019-07-02 MED ORDER — MELOXICAM 15 MG PO TABS: ORAL_TABLET | ORAL | 1 refills | Status: DC

## 2019-07-02 NOTE — Patient Instructions (Addendum)
   We are going to send in Meloxicam for you to take one tablet every day for two weeks.  Take this with food to prevent stomach upset.  Please ice your hip and should for 15-73min a time, four times a day.  You may try Voltaren gel (diclofenac)  You can get this at any pharmacy.   We will send in Gabapentin 100mg  tablet for you to take at bedtime.    Please let us know if you have new or worsening symptoms.

## 2019-08-02 ENCOUNTER — Other Ambulatory Visit: Payer: Self-pay | Admitting: Adult Health Nurse Practitioner

## 2019-08-02 DIAGNOSIS — R202 Paresthesia of skin: Secondary | ICD-10-CM

## 2019-08-12 ENCOUNTER — Ambulatory Visit: Payer: Medicare Other | Admitting: Internal Medicine

## 2019-09-29 ENCOUNTER — Other Ambulatory Visit: Payer: Self-pay | Admitting: Adult Health Nurse Practitioner

## 2019-09-29 DIAGNOSIS — R202 Paresthesia of skin: Secondary | ICD-10-CM

## 2019-09-29 DIAGNOSIS — M25512 Pain in left shoulder: Secondary | ICD-10-CM

## 2019-12-11 ENCOUNTER — Encounter: Payer: Medicare Other | Admitting: Internal Medicine

## 2019-12-12 ENCOUNTER — Encounter: Payer: Medicare Other | Admitting: Internal Medicine

## 2019-12-24 ENCOUNTER — Other Ambulatory Visit: Payer: Self-pay

## 2019-12-24 ENCOUNTER — Encounter: Payer: Self-pay | Admitting: Adult Health Nurse Practitioner

## 2019-12-24 ENCOUNTER — Ambulatory Visit (INDEPENDENT_AMBULATORY_CARE_PROVIDER_SITE_OTHER): Payer: Medicare Other | Admitting: Adult Health Nurse Practitioner

## 2019-12-24 VITALS — BP 120/66 | HR 73 | Temp 97.9°F | Ht 59.75 in | Wt 142.0 lb

## 2019-12-24 DIAGNOSIS — R7309 Other abnormal glucose: Secondary | ICD-10-CM

## 2019-12-24 DIAGNOSIS — N183 Chronic kidney disease, stage 3 unspecified: Secondary | ICD-10-CM

## 2019-12-24 DIAGNOSIS — K219 Gastro-esophageal reflux disease without esophagitis: Secondary | ICD-10-CM

## 2019-12-24 DIAGNOSIS — Z136 Encounter for screening for cardiovascular disorders: Secondary | ICD-10-CM

## 2019-12-24 DIAGNOSIS — Z0000 Encounter for general adult medical examination without abnormal findings: Secondary | ICD-10-CM

## 2019-12-24 DIAGNOSIS — E559 Vitamin D deficiency, unspecified: Secondary | ICD-10-CM | POA: Diagnosis not present

## 2019-12-24 DIAGNOSIS — M8580 Other specified disorders of bone density and structure, unspecified site: Secondary | ICD-10-CM

## 2019-12-24 DIAGNOSIS — Z6827 Body mass index (BMI) 27.0-27.9, adult: Secondary | ICD-10-CM

## 2019-12-24 DIAGNOSIS — Z0001 Encounter for general adult medical examination with abnormal findings: Secondary | ICD-10-CM

## 2019-12-24 DIAGNOSIS — Z1389 Encounter for screening for other disorder: Secondary | ICD-10-CM

## 2019-12-24 DIAGNOSIS — E785 Hyperlipidemia, unspecified: Secondary | ICD-10-CM

## 2019-12-24 DIAGNOSIS — D508 Other iron deficiency anemias: Secondary | ICD-10-CM

## 2019-12-24 DIAGNOSIS — R8290 Unspecified abnormal findings in urine: Secondary | ICD-10-CM

## 2019-12-24 DIAGNOSIS — Z79899 Other long term (current) drug therapy: Secondary | ICD-10-CM

## 2019-12-24 DIAGNOSIS — I10 Essential (primary) hypertension: Secondary | ICD-10-CM

## 2019-12-24 DIAGNOSIS — R300 Dysuria: Secondary | ICD-10-CM | POA: Diagnosis not present

## 2019-12-24 MED ORDER — AMLODIPINE BESYLATE 5 MG PO TABS: 5.0000 mg | ORAL_TABLET | Freq: Every day | ORAL | 3 refills | Status: DC

## 2019-12-24 NOTE — Progress Notes (Signed)
[place] VISIT AND FOLLOW UP  Assessment / Plan   [name] was seen today for annual exam.  Diagnoses and all orders for this visit:  Encounter for general adult medical examination with abnormal findings Yearly  Essential hypertension Continue current medications: Amlodipine 5mg  Monitor blood pressure at home; call if consistently over 130/80 Continue DASH diet.   Reminder to go to the ER if any CP, SOB, nausea, dizziness, severe HA, changes vision/speech, left arm numbness and tingling and jaw pain. -     CBC with Differential/Platelet -     COMPLETE METABOLIC PANEL WITH GFR -     TSH -     EKG 12-Lead -     amLODipine (NORVASC) 5 MG tablet; Take 1 tablet (5 mg total) by mouth daily.  Hyperlipidemia, unspecified hyperlipidemia type Controlled with diet and exercise Discussed dietary and exercise modifications Low fat diet -     Lipid panel  Gastroesophageal reflux disease, unspecified whether esophagitis present Doing well at this time Diet discussed Monitor for triggers Avoid food with high acid content Avoid excessive cafeine Increase water intake   Vitamin D deficiency Continue supplementation to maintain goal of 70-100 Taking Vitamin D 5,000 IU daily -     VITAMIN D 25 Hydroxy (Vit-D Deficiency, Fractures)  Osteopenia, unspecified location DEXA UTD, Q2 Follows with GYN  Stage 3 chronic kidney disease, unspecified whether stage 3a or 3b CKD Increase fluids  Avoid NSAIDS Blood pressure control Monitor sugars  Will continue to monitor  Screening for blood or protein in urine -     Urinalysis w microscopic + reflex cultur  Abnormal glucose Discussed dietary and exercise modifications -     Hemoglobin A1c  Other iron deficiency anemia No supplementation at this time -     Iron,Total/Total Iron Binding Cap  BMI 27.0 - 27.9 Improving Down 12lbs since last OV Discussed dietary and exercise modifications  Medication  management Continued  Other orders -     Iron,Total/Total Iron Binding Cap -     Urine Culture -     REFLEXIVE URINE CULTURE -     Ferritin   Over 40 minutes of face to face interview, exam, counseling, chart review, and critical decision making was performed Future Appointments  Date Time Provider [place]  06/24/2020 11:00 AM Garnet Sierras, NP [facility] None  12/28/2020 10:00 AM Garnet Sierras, NP [facility] None      Subjective:   Jacqueline Hancock is a 79 y.o.  female who presents for CPE and 3 month follow up on HTN, HLD, prediabetes/abnormal glucose, vitamin D def.   BMI is Body mass index is 27.97 kg/m., she is working on diet and exercise. Wt Readings from Last 3 Encounters:  12/24/19 142 lb (64.4 kg)  07/02/19 154 lb 9.6 oz (70.1 kg)  05/21/19 155 lb (70.3 kg)    Her blood pressure has been controlled at home, today their BP is BP: 120/66 She does workout, does it in her house. She denies chest pain, shortness of breath, dizziness.  She is not on cholesterol medication and denies myalgias. Her cholesterol is at goal. The cholesterol last visit was:   Lab Results  Component Value Date   CHOL 96 12/24/2019   HDL 28 (L) 12/24/2019   LDLCALC 49 12/24/2019   TRIG 104 12/24/2019   CHOLHDL 3.4 12/24/2019   She has been working on diet and exercise for prediabetes, and denies paresthesia of the feet, polydipsia, polyuria and visual disturbances. Last  A1C in the office was:  Lab Results  Component Value Date   HGBA1C 4.5 12/24/2019   Lab Results  Component Value Date   GFRAA 49 (L) 12/24/2019   Patient is on Vitamin D supplement. Lab Results  Component Value Date   VD25OH 92 12/24/2019      Medication Review Current Outpatient Medications on File Prior to Visit  Medication Sig Dispense Refill  . Ascorbic Acid (VITAMIN C) 1000 MG tablet Take 1,000 mg by mouth daily.    . calcium carbonate (OSCAL) 1500 (600 Ca) MG TABS tablet Take 500 mg by mouth  daily.    . Cholecalciferol (VITAMIN D) 2000 UNITS tablet Take 4,000 Units by mouth daily.     . Ferrous Sulfate (IRON) 325 (65 Fe) MG TABS Take by mouth daily.    Marland Kitchen gabapentin (NEURONTIN) 100 MG capsule Take 1 capsule 3 x /day for Neuropathy Pain 270 capsule 0   No current facility-administered medications on file prior to visit.    Current Problems (verified) Patient Active Problem List   Diagnosis Date Noted  . CKD (chronic kidney disease) stage 3, GFR 30-59 ml/min 09/10/2015  . Vitamin D deficiency 06/20/2013  . Medication management 06/20/2013  . Hypertension   . Hyperlipidemia   . Abnormal glucose   . GERD (gastroesophageal reflux disease)   . Osteopenia     Screening Tests Immunization History  Administered Date(s) Administered  . Influenza, High Dose Seasonal PF 01/28/2015, 12/11/2015, 01/25/2017, 01/22/2018, 01/10/2019, 02/03/2019  . Influenza-Unspecified 01/29/2013, 12/27/2013, 01/25/2017  . PFIZER SARS-COV-2 Vaccination 05/11/2019, 06/03/2019  . PPD Test 02/25/2013  . Pneumococcal Conjugate-13 01/24/2017  . Pneumococcal Polysaccharide-23 02/25/2013  . Td 09/13/2001, 02/20/2012  . Tdap 02/25/2013  . Zoster 10/23/2006   Preventative care: Last colonoscopy:  2019 due in 5years Dr Earlean Shawl Last mammogram: 03/2016 DUE scheduled for 02/2020 Last pap smear/pelvic exam: 2015 at GYN qQ2 years DEXA: 03/2018 osteopenia, Ca & Vit D CXR 2012    Prior vaccinations: TD or Tdap: 2014  Influenza:  2019 Pneumococcal: 2014 Prevnar13: 2018 Shingles/Zostavax: 2008  Names of Other Physician/Practitioners you currently use: 1. [facility]- here for primary care 2. Dr. Gershon Crane, eye doctor, 2020, due for 2021 3. Has partial, dentist, last visit 2 years  Patient Care Team: Unk Pinto, MD as PCP - General (Internal Medicine) Jodi Marble, MD as Consulting Physician (Otolaryngology) Richmond Campbell, MD as Consulting Physician  (Gastroenterology) Leo Grosser, Seymour Bars, MD (Inactive) as Consulting Physician (Obstetrics and Gynecology) Rutherford Guys, MD as Consulting Physician (Ophthalmology)  Allergies Allergies  Allergen Reactions  . Nexium [Esomeprazole Magnesium] Other (See Comments)    Headache    SURGICAL HISTORY She  has a past surgical history that includes Parotid gland tumor excision (2012); Abdominal hysterectomy; Appendectomy; Tonsillectomy and adenoidectomy; and Cataract extraction, bilateral (Bilateral, 09/2017). FAMILY HISTORY Her family history includes Diabetes in her brother, brother, and sister; Heart disease in her brother; Hypertension in her father; Leukemia in her mother. SOCIAL HISTORY She  reports that she has never smoked. She has never used smokeless tobacco. She reports that she does not drink alcohol.    Objective:   Today's Vitals   12/24/19 1355  BP: 120/66  Pulse: 73  Temp: 97.9 F (36.6 C)  SpO2: 96%  Weight: 142 lb (64.4 kg)  Height: 4' 11.75" (1.518 m)   Body mass index is 27.97 kg/m.  General appearance: alert, no distress, WD/WN,  female HEENT: normocephalic, sclerae anicteric, TMs pearly, nares patent, no  discharge or erythema, pharynx normal Oral cavity: MMM, no lesions Neck: supple, no lymphadenopathy, no thyromegaly, no masses Heart: RRR, normal S1, S2, no murmurs Lungs: CTA bilaterally, no wheezes, rhonchi, or rales Abdomen: +bs, soft, + epigastric tender, non distended, no masses, no hepatomegaly, no splenomegaly Musculoskeletal: nontender, no swelling, no obvious deformity Extremities: no edema, no cyanosis, no clubbing Pulses: 2+ symmetric, upper and lower extremities, normal cap refill Neurological: alert, oriented x 3, CN2-12 intact, strength normal upper extremities and lower extremities, sensation normal throughout, DTRs 2+ throughout, no cerebellar signs, gait normal Psychiatric: normal affect, behavior normal, pleasant  Skin: seb cyst right post  auricular, no lymphadenopathy Breast: defer Gyn: defer Rectal: defer  EKG: NSR  Garnet Sierras, NP   12/24/2019

## 2019-12-26 ENCOUNTER — Other Ambulatory Visit: Payer: Self-pay | Admitting: Adult Health Nurse Practitioner

## 2019-12-26 DIAGNOSIS — D72820 Lymphocytosis (symptomatic): Secondary | ICD-10-CM

## 2019-12-26 DIAGNOSIS — D649 Anemia, unspecified: Secondary | ICD-10-CM

## 2019-12-27 LAB — CBC WITH DIFFERENTIAL/PLATELET
Absolute Monocytes: 5435 cells/uL — ABNORMAL HIGH (ref 200–950)
Basophils Absolute: 55 cells/uL (ref 0–200)
Basophils Relative: 0.3 %
Eosinophils Absolute: 110 cells/uL (ref 15–500)
Eosinophils Relative: 0.6 %
HCT: 30.7 % — ABNORMAL LOW (ref 35.0–45.0)
Hemoglobin: 10.3 g/dL — ABNORMAL LOW (ref 11.7–15.5)
Lymphs Abs: 10285 cells/uL — ABNORMAL HIGH (ref 850–3900)
MCH: 30.7 pg (ref 27.0–33.0)
MCHC: 33.6 g/dL (ref 32.0–36.0)
MCV: 91.6 fL (ref 80.0–100.0)
MPV: 11.6 fL (ref 7.5–12.5)
Monocytes Relative: 29.7 %
Neutro Abs: 2416 cells/uL (ref 1500–7800)
Neutrophils Relative %: 13.2 %
Platelets: 135 10*3/uL — ABNORMAL LOW (ref 140–400)
RBC: 3.35 10*6/uL — ABNORMAL LOW (ref 3.80–5.10)
RDW: 13.1 % (ref 11.0–15.0)
Total Lymphocyte: 56.2 %
WBC: 18.3 10*3/uL — ABNORMAL HIGH (ref 3.8–10.8)

## 2019-12-27 LAB — COMPLETE METABOLIC PANEL WITH GFR
AG Ratio: 2.1 (calc) (ref 1.0–2.5)
ALT: 16 U/L (ref 6–29)
AST: 20 U/L (ref 10–35)
Albumin: 4.7 g/dL (ref 3.6–5.1)
Alkaline phosphatase (APISO): 95 U/L (ref 37–153)
BUN/Creatinine Ratio: 17 (calc) (ref 6–22)
BUN: 21 mg/dL (ref 7–25)
CO2: 27 mmol/L (ref 20–32)
Calcium: 10.1 mg/dL (ref 8.6–10.4)
Chloride: 104 mmol/L (ref 98–110)
Creat: 1.22 mg/dL — ABNORMAL HIGH (ref 0.60–0.93)
GFR, Est African American: 49 mL/min/{1.73_m2} — ABNORMAL LOW (ref >=60)
GFR, Est Non African American: 42 mL/min/{1.73_m2} — ABNORMAL LOW (ref >=60)
Globulin: 2.2 g/dL (calc) (ref 1.9–3.7)
Glucose, Bld: 90 mg/dL (ref 65–99)
Potassium: 4.7 mmol/L (ref 3.5–5.3)
Sodium: 142 mmol/L (ref 135–146)
Total Bilirubin: 2 mg/dL — ABNORMAL HIGH (ref 0.2–1.2)
Total Protein: 6.9 g/dL (ref 6.1–8.1)

## 2019-12-27 LAB — URINALYSIS W MICROSCOPIC + REFLEX CULTURE
Bacteria, UA: NONE SEEN /HPF
Bilirubin Urine: NEGATIVE
Glucose, UA: NEGATIVE
Hgb urine dipstick: NEGATIVE
Hyaline Cast: NONE SEEN /LPF
Ketones, ur: NEGATIVE
Nitrites, Initial: NEGATIVE
Protein, ur: NEGATIVE
RBC / HPF: NONE SEEN /HPF (ref 0–2)
Specific Gravity, Urine: 1.017 (ref 1.001–1.03)
pH: 6 (ref 5.0–8.0)

## 2019-12-27 LAB — CULTURE INDICATED

## 2019-12-27 LAB — VITAMIN D 25 HYDROXY (VIT D DEFICIENCY, FRACTURES): Vit D, 25-Hydroxy: 92 ng/mL (ref 30–100)

## 2019-12-27 LAB — URINE CULTURE
MICRO NUMBER:: 10978692
SPECIMEN QUALITY:: ADEQUATE

## 2019-12-27 LAB — HEMOGLOBIN A1C
Hgb A1c MFr Bld: 4.5 % of total Hgb (ref <5.7)
Mean Plasma Glucose: 82 (calc)
eAG (mmol/L): 4.6 (calc)

## 2019-12-27 LAB — LIPID PANEL
Cholesterol: 96 mg/dL (ref <200)
HDL: 28 mg/dL — ABNORMAL LOW (ref >=50)
LDL Cholesterol (Calc): 49 mg/dL (calc)
Non-HDL Cholesterol (Calc): 68 mg/dL (calc) (ref <130)
Total CHOL/HDL Ratio: 3.4 (calc) (ref <5.0)
Triglycerides: 104 mg/dL (ref <150)

## 2019-12-27 LAB — TEST AUTHORIZATION

## 2019-12-27 LAB — IRON, TOTAL/TOTAL IRON BINDING CAP
%SAT: 12 % (calc) — ABNORMAL LOW (ref 16–45)
Iron: 43 ug/dL — ABNORMAL LOW (ref 45–160)
TIBC: 345 mcg/dL (calc) (ref 250–450)

## 2019-12-27 LAB — TSH: TSH: 1.95 mIU/L (ref 0.40–4.50)

## 2019-12-27 LAB — FERRITIN: Ferritin: 412 ng/mL — ABNORMAL HIGH (ref 16–288)

## 2019-12-30 ENCOUNTER — Other Ambulatory Visit: Payer: Medicare Other

## 2019-12-30 ENCOUNTER — Other Ambulatory Visit: Payer: Self-pay

## 2019-12-30 DIAGNOSIS — D72820 Lymphocytosis (symptomatic): Secondary | ICD-10-CM

## 2019-12-30 DIAGNOSIS — D649 Anemia, unspecified: Secondary | ICD-10-CM

## 2020-01-03 LAB — PROTEIN ELECTROPHORESIS, SERUM, WITH REFLEX
Albumin ELP: 3.9 g/dL (ref 3.8–4.8)
Alpha 1: 0.4 g/dL — ABNORMAL HIGH (ref 0.2–0.3)
Alpha 2: 0.5 g/dL (ref 0.5–0.9)
Beta 2: 0.3 g/dL (ref 0.2–0.5)
Beta Globulin: 0.4 g/dL (ref 0.4–0.6)
Gamma Globulin: 0.8 g/dL (ref 0.8–1.7)
Total Protein: 6.3 g/dL (ref 6.1–8.1)

## 2020-01-03 LAB — CBC WITH DIFFERENTIAL/PLATELET
Absolute Monocytes: 6733 cells/uL — ABNORMAL HIGH (ref 200–950)
Basophils Absolute: 56 cells/uL (ref 0–200)
Basophils Relative: 0.3 %
Eosinophils Absolute: 112 cells/uL (ref 15–500)
Eosinophils Relative: 0.6 %
HCT: 29.3 % — ABNORMAL LOW (ref 35.0–45.0)
Hemoglobin: 9.8 g/dL — ABNORMAL LOW (ref 11.7–15.5)
Lymphs Abs: 8947 cells/uL — ABNORMAL HIGH (ref 850–3900)
MCH: 31 pg (ref 27.0–33.0)
MCHC: 33.4 g/dL (ref 32.0–36.0)
MCV: 92.7 fL (ref 80.0–100.0)
MPV: 11.4 fL (ref 7.5–12.5)
Monocytes Relative: 36.2 %
Neutro Abs: 2753 cells/uL (ref 1500–7800)
Neutrophils Relative %: 14.8 %
Platelets: 133 10*3/uL — ABNORMAL LOW (ref 140–400)
RBC: 3.16 10*6/uL — ABNORMAL LOW (ref 3.80–5.10)
RDW: 13.7 % (ref 11.0–15.0)
Total Lymphocyte: 48.1 %
WBC: 18.6 10*3/uL — ABNORMAL HIGH (ref 3.8–10.8)

## 2020-01-03 LAB — LEUKEMIA/LYMPHOMA EVALUATION PANEL
NUMBER OF MARKERS:: 22
VIABILITY:: 89 %

## 2020-01-03 LAB — RETICULOCYTES
ABS Retic: 110600 cells/uL — ABNORMAL HIGH (ref 20000–8000)
Retic Ct Pct: 3.5 %

## 2020-01-06 DIAGNOSIS — Z961 Presence of intraocular lens: Secondary | ICD-10-CM | POA: Diagnosis not present

## 2020-01-06 DIAGNOSIS — H43392 Other vitreous opacities, left eye: Secondary | ICD-10-CM | POA: Diagnosis not present

## 2020-01-06 DIAGNOSIS — H524 Presbyopia: Secondary | ICD-10-CM | POA: Diagnosis not present

## 2020-01-10 ENCOUNTER — Other Ambulatory Visit: Payer: Self-pay | Admitting: Adult Health Nurse Practitioner

## 2020-01-10 ENCOUNTER — Other Ambulatory Visit: Payer: Self-pay

## 2020-01-10 DIAGNOSIS — D649 Anemia, unspecified: Secondary | ICD-10-CM

## 2020-01-10 LAB — POC HEMOCCULT BLD/STL (HOME/3-CARD/SCREEN)
Card #2 Fecal Occult Blod, POC: NEGATIVE
Card #3 Fecal Occult Blood, POC: NEGATIVE
Fecal Occult Blood, POC: NEGATIVE

## 2020-01-14 ENCOUNTER — Other Ambulatory Visit: Payer: Self-pay | Admitting: Adult Health Nurse Practitioner

## 2020-01-14 DIAGNOSIS — N183 Chronic kidney disease, stage 3 unspecified: Secondary | ICD-10-CM

## 2020-01-14 DIAGNOSIS — D649 Anemia, unspecified: Secondary | ICD-10-CM

## 2020-01-22 ENCOUNTER — Other Ambulatory Visit: Payer: Medicare Other

## 2020-01-22 ENCOUNTER — Other Ambulatory Visit: Payer: Self-pay

## 2020-01-22 DIAGNOSIS — D649 Anemia, unspecified: Secondary | ICD-10-CM | POA: Diagnosis not present

## 2020-01-22 DIAGNOSIS — N183 Chronic kidney disease, stage 3 unspecified: Secondary | ICD-10-CM

## 2020-01-23 LAB — CBC WITH DIFFERENTIAL/PLATELET
Absolute Monocytes: 9521 cells/uL — ABNORMAL HIGH (ref 200–950)
Basophils Absolute: 53 cells/uL (ref 0–200)
Basophils Relative: 0.2 %
Eosinophils Absolute: 0 cells/uL — ABNORMAL LOW (ref 15–500)
Eosinophils Relative: 0 %
HCT: 22.6 % — ABNORMAL LOW (ref 35.0–45.0)
Hemoglobin: 7.7 g/dL — ABNORMAL LOW (ref 11.7–15.5)
Lymphs Abs: 13492 cells/uL — ABNORMAL HIGH (ref 850–3900)
MCH: 32 pg (ref 27.0–33.0)
MCHC: 34.1 g/dL (ref 32.0–36.0)
MCV: 93.8 fL (ref 80.0–100.0)
MPV: 11.1 fL (ref 7.5–12.5)
Monocytes Relative: 36.2 %
Neutro Abs: 3235 cells/uL (ref 1500–7800)
Neutrophils Relative %: 12.3 %
Platelets: 149 10*3/uL (ref 140–400)
RBC: 2.41 10*6/uL — ABNORMAL LOW (ref 3.80–5.10)
RDW: 15.6 % — ABNORMAL HIGH (ref 11.0–15.0)
Total Lymphocyte: 51.3 %
WBC: 26.3 10*3/uL — ABNORMAL HIGH (ref 3.8–10.8)

## 2020-01-23 LAB — COMPLETE METABOLIC PANEL WITH GFR
AG Ratio: 2.1 (calc) (ref 1.0–2.5)
ALT: 13 U/L (ref 6–29)
AST: 19 U/L (ref 10–35)
Albumin: 4.4 g/dL (ref 3.6–5.1)
Alkaline phosphatase (APISO): 88 U/L (ref 37–153)
BUN/Creatinine Ratio: 21 (calc) (ref 6–22)
BUN: 27 mg/dL — ABNORMAL HIGH (ref 7–25)
CO2: 25 mmol/L (ref 20–32)
Calcium: 9.5 mg/dL (ref 8.6–10.4)
Chloride: 102 mmol/L (ref 98–110)
Creat: 1.26 mg/dL — ABNORMAL HIGH (ref 0.60–0.93)
GFR, Est African American: 47 mL/min/{1.73_m2} — ABNORMAL LOW (ref >=60)
GFR, Est Non African American: 40 mL/min/{1.73_m2} — ABNORMAL LOW (ref >=60)
Globulin: 2.1 g/dL (calc) (ref 1.9–3.7)
Glucose, Bld: 91 mg/dL (ref 65–99)
Potassium: 4.5 mmol/L (ref 3.5–5.3)
Sodium: 139 mmol/L (ref 135–146)
Total Bilirubin: 3.1 mg/dL — ABNORMAL HIGH (ref 0.2–1.2)
Total Protein: 6.5 g/dL (ref 6.1–8.1)

## 2020-01-24 NOTE — Progress Notes (Signed)
Spoke with patient via telephone.  Instructed to go to ER, [facility].  Patient has been asymptomatic since 12/24/19 OV.  Continual decline in Hgb 10.3 and now 7.7 over the course of one month.  Hemoccult cards in office negative.  Patient reports she will work to get someone to care for her husband so she can go to ER.  Has been feeling more tired over the past week.   Garnet Sierras, Laqueta Jean, DNP [facility] 01/24/2020  10:55 AM

## 2020-01-25 ENCOUNTER — Observation Stay (HOSPITAL_COMMUNITY)
Admission: EM | Admit: 2020-01-25 | Discharge: 2020-01-26 | Disposition: A | Discharge status: Home / Self Care | Payer: Medicare Other | Attending: Internal Medicine | Admitting: Internal Medicine

## 2020-01-25 ENCOUNTER — Other Ambulatory Visit: Payer: Self-pay

## 2020-01-25 ENCOUNTER — Encounter (HOSPITAL_COMMUNITY): Payer: Self-pay | Admitting: Emergency Medicine

## 2020-01-25 DIAGNOSIS — I159 Secondary hypertension, unspecified: Secondary | ICD-10-CM | POA: Diagnosis not present

## 2020-01-25 DIAGNOSIS — D6489 Other specified anemias: Secondary | ICD-10-CM | POA: Diagnosis not present

## 2020-01-25 DIAGNOSIS — D72820 Lymphocytosis (symptomatic): Secondary | ICD-10-CM

## 2020-01-25 DIAGNOSIS — C8310 Mantle cell lymphoma, unspecified site: Secondary | ICD-10-CM | POA: Diagnosis present

## 2020-01-25 DIAGNOSIS — Z20822 Contact with and (suspected) exposure to covid-19: Secondary | ICD-10-CM | POA: Insufficient documentation

## 2020-01-25 DIAGNOSIS — D649 Anemia, unspecified: Secondary | ICD-10-CM | POA: Diagnosis not present

## 2020-01-25 DIAGNOSIS — I129 Hypertensive chronic kidney disease with stage 1 through stage 4 chronic kidney disease, or unspecified chronic kidney disease: Secondary | ICD-10-CM | POA: Diagnosis not present

## 2020-01-25 DIAGNOSIS — D631 Anemia in chronic kidney disease: Secondary | ICD-10-CM | POA: Diagnosis present

## 2020-01-25 DIAGNOSIS — N1832 Chronic kidney disease, stage 3b: Secondary | ICD-10-CM | POA: Diagnosis present

## 2020-01-25 DIAGNOSIS — N183 Chronic kidney disease, stage 3 unspecified: Secondary | ICD-10-CM

## 2020-01-25 DIAGNOSIS — Z79899 Other long term (current) drug therapy: Secondary | ICD-10-CM | POA: Insufficient documentation

## 2020-01-25 DIAGNOSIS — I10 Essential (primary) hypertension: Secondary | ICD-10-CM | POA: Diagnosis present

## 2020-01-25 DIAGNOSIS — E785 Hyperlipidemia, unspecified: Secondary | ICD-10-CM | POA: Diagnosis present

## 2020-01-25 DIAGNOSIS — R799 Abnormal finding of blood chemistry, unspecified: Secondary | ICD-10-CM | POA: Diagnosis present

## 2020-01-25 LAB — CBC WITH DIFFERENTIAL/PLATELET
Abs Immature Granulocytes: 0.05 10*3/uL (ref 0.00–0.07)
Basophils Absolute: 0.1 10*3/uL (ref 0.0–0.1)
Basophils Relative: 0 %
Eosinophils Absolute: 0.1 10*3/uL (ref 0.0–0.5)
Eosinophils Relative: 0 %
HCT: 22 % — ABNORMAL LOW (ref 36.0–46.0)
Hemoglobin: 7.1 g/dL — ABNORMAL LOW (ref 12.0–15.0)
Immature Granulocytes: 0 %
Lymphocytes Relative: 60 %
Lymphs Abs: 14.3 10*3/uL — ABNORMAL HIGH (ref 0.7–4.0)
MCH: 32.1 pg (ref 26.0–34.0)
MCHC: 32.3 g/dL (ref 30.0–36.0)
MCV: 99.5 fL (ref 80.0–100.0)
Monocytes Absolute: 6.6 10*3/uL — ABNORMAL HIGH (ref 0.1–1.0)
Monocytes Relative: 28 %
Neutro Abs: 2.9 10*3/uL (ref 1.7–7.7)
Neutrophils Relative %: 12 %
Platelets: 141 10*3/uL — ABNORMAL LOW (ref 150–400)
RBC: 2.21 MIL/uL — ABNORMAL LOW (ref 3.87–5.11)
RDW: 18.6 % — ABNORMAL HIGH (ref 11.5–15.5)
WBC: 24 10*3/uL — ABNORMAL HIGH (ref 4.0–10.5)
nRBC: 0 % (ref 0.0–0.2)

## 2020-01-25 LAB — COMPREHENSIVE METABOLIC PANEL
ALT: 15 U/L (ref 0–44)
AST: 19 U/L (ref 15–41)
Albumin: 4.2 g/dL (ref 3.5–5.0)
Alkaline Phosphatase: 86 U/L (ref 38–126)
Anion gap: 10 (ref 5–15)
BUN: 21 mg/dL (ref 8–23)
CO2: 26 mmol/L (ref 22–32)
Calcium: 9.2 mg/dL (ref 8.9–10.3)
Chloride: 103 mmol/L (ref 98–111)
Creatinine, Ser: 1.17 mg/dL — ABNORMAL HIGH (ref 0.44–1.00)
GFR, Estimated: 47 mL/min — ABNORMAL LOW (ref >60)
Glucose, Bld: 111 mg/dL — ABNORMAL HIGH (ref 70–99)
Potassium: 4.1 mmol/L (ref 3.5–5.1)
Sodium: 139 mmol/L (ref 135–145)
Total Bilirubin: 3 mg/dL — ABNORMAL HIGH (ref 0.3–1.2)
Total Protein: 6.8 g/dL (ref 6.5–8.1)

## 2020-01-25 LAB — RETICULOCYTES
Immature Retic Fract: 27 % — ABNORMAL HIGH (ref 2.3–15.9)
RBC.: 2.17 MIL/uL — ABNORMAL LOW (ref 3.87–5.11)
Retic Count, Absolute: 172.1 10*3/uL (ref 19.0–186.0)
Retic Ct Pct: 7.9 % — ABNORMAL HIGH (ref 0.4–3.1)

## 2020-01-25 LAB — URINALYSIS, ROUTINE W REFLEX MICROSCOPIC
Bilirubin Urine: NEGATIVE
Glucose, UA: NEGATIVE mg/dL
Hgb urine dipstick: NEGATIVE
Ketones, ur: NEGATIVE mg/dL
Leukocytes,Ua: NEGATIVE
Nitrite: NEGATIVE
Protein, ur: NEGATIVE mg/dL
Specific Gravity, Urine: 1.015 (ref 1.005–1.030)
pH: 5 (ref 5.0–8.0)

## 2020-01-25 LAB — RESPIRATORY PANEL BY RT PCR (FLU A&B, COVID)
Influenza A by PCR: NEGATIVE
Influenza B by PCR: NEGATIVE
SARS Coronavirus 2 by RT PCR: NEGATIVE

## 2020-01-25 LAB — PREPARE RBC (CROSSMATCH)

## 2020-01-25 MED ORDER — FERROUS SULFATE 325 (65 FE) MG PO TABS
325.0000 mg | ORAL_TABLET | Freq: Every day | ORAL | Status: DC
  Administered 2020-01-26: 325 mg via ORAL
  Filled 2020-01-25: qty 1

## 2020-01-25 MED ORDER — AMLODIPINE BESYLATE 5 MG PO TABS
5.0000 mg | ORAL_TABLET | Freq: Every day | ORAL | Status: DC
  Administered 2020-01-26: 5 mg via ORAL
  Filled 2020-01-25: qty 1

## 2020-01-25 MED ORDER — SODIUM CHLORIDE 0.9% FLUSH
3.0000 mL | Freq: Two times a day (BID) | INTRAVENOUS | Status: DC
  Administered 2020-01-25 – 2020-01-26 (×2): 3 mL via INTRAVENOUS

## 2020-01-25 MED ORDER — SODIUM CHLORIDE 0.9% FLUSH: 3.0000 mL | INTRAVENOUS | Status: DC | PRN

## 2020-01-25 MED ORDER — SODIUM CHLORIDE 0.9% IV SOLUTION: Freq: Once | INTRAVENOUS | Status: DC

## 2020-01-25 MED ORDER — ZOLPIDEM TARTRATE 5 MG PO TABS: 5.0000 mg | ORAL_TABLET | Freq: Every evening | ORAL | Status: DC | PRN

## 2020-01-25 MED ORDER — SODIUM CHLORIDE 0.9 % IV SOLN: 10.0000 mL/h | Freq: Once | INTRAVENOUS | Status: DC

## 2020-01-25 MED ORDER — SODIUM CHLORIDE 0.9 % IV SOLN: 250.0000 mL | INTRAVENOUS | Status: DC | PRN

## 2020-01-25 MED ORDER — ENOXAPARIN SODIUM 40 MG/0.4ML ~~LOC~~ SOLN
40.0000 mg | SUBCUTANEOUS | Status: DC
  Administered 2020-01-25: 40 mg via SUBCUTANEOUS
  Filled 2020-01-25: qty 0.4

## 2020-01-25 NOTE — ED Notes (Signed)
Pt reports being difficult to get an IV on. x1 unsuccessful attempt at obtaining an IV.

## 2020-01-25 NOTE — ED Notes (Addendum)
Per blood bank, patient has antibodies in her blood, so it could take 1-3 hours to finish preparing the RBCs.

## 2020-01-25 NOTE — ED Provider Notes (Signed)
Assumed care of patient at change of shift.  Patient was waiting for transfusion with plan for discharge to follow-up with hematology. Physical Exam  BP 140/63   Pulse 76   Temp 98.3 F (36.8 C)   Resp 18   Ht 4\' 11"  (1.499 m)   Wt 66.7 kg   SpO2 100%   BMI 29.69 kg/m   Physical Exam Well-appearing, no acute distress, respirations even unlabored. ED Course/Procedures     Procedures  MDM  Informed by lab, due to antibodies patient is unable to receive transfusion at this time.  Plan is to consult hospitalist for admission for transfusion once available.  Case discussed with Dr. Linda Hedges with [facility] who will consult for admission.      Tacy Learn, PA-C 01/25/20 2032    Lacretia Leigh, MD 01/26/20 [phone]

## 2020-01-25 NOTE — H&P (Signed)
History and Physical    Jacqueline Hancock:[id number] DOB: March 19, 1941 DOA: 01/25/2020  PCP: Unk Pinto, MD (Confirm with patient/family/NH records and if not entered, this has to be entered at [facility] point of entry) Patient coming from: home  I have personally briefly reviewed patient's old medical records in [place]  Chief Complaint: anemia with generalized fatigue  HPI: Jacqueline Hancock is a 79 y.o. female with medical history significant of hypertension, chronic kidney disease --presents the emergency department for evaluation of low blood counts at the urging of her primary provider.  It appears that the patient had a routine evaluation on 12/24/2019.  At that time it was noted that her hemoglobin was 10.3 and her white blood cell count was 18.3 with lymphocytosis (10.3) and monocytosis (5.4).  Patient had labs rechecked on 9/27.  Hemoglobin was then 9.8 with white blood cell count of 18.6.  She then had Hemoccult testing ordered which was negative x3.  On 10/20 hemoglobin was then 7.7 with a white blood cell count of 26.3, 13.5 lymphocytes and 9.5 monocytes.  Patient was called and encouraged to come to the emergency department.  She is asymptomatic except for fatigue over the past week.  She states that she lives at home with her husband who has dementia and she is the primary caregiver.  She denies bloody or black stools, easy bruising or bleeding, blood in her urine.  No fevers, chills or other infectious symptoms.  No chest pain, shortness of breath.  She has had chronic abdominal pain without acute worsening, fatty liver diagnosed about a year ago. Type and cross match ordered but the patient has unusual antibodies and there will be a delay in getting blood. [facility] called to admit the patient for further evaluation of anemia and lymphocytosis.  ED Course: T 98.3  121/52  76  18. Exam by ED-PA unremarkable. Lab: Cmet with Glucose 11, Cr 1.17 (1.26 10/20, 1.22 12/24/19). WBC 24k with 12%  neeutrophils, 60 % lymphocytes, 28% monocytes. Machine read: smudge cells, polychromasia and abnormal lymphocytes. Pathology review of blood smear ordered and pending. Retic % 7.9%, abs Retic 172.1. U/A negative.  Review of Systems: As per HPI otherwise 10 point review of systems negative.   Past Medical History:  Diagnosis Date  . Early cataracts, bilateral   . Elevated hemoglobin A1c   . Fibroids   . GERD (gastroesophageal reflux disease)   . Hyperlipidemia   . Hypertension   . Osteopenia     Past Surgical History:  Procedure Laterality Date  . ABDOMINAL HYSTERECTOMY    . APPENDECTOMY    . CATARACT EXTRACTION, BILATERAL Bilateral 09/2017   Dr. Richardean Sale  . 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 TONSILLECTOMY AND ADENOIDECTOMY     Soc Hx -  Married 74 years. Husband with dementia and she is primary care giver. She has 3 sons - one in [place], one in [place], one in [place]; 8 grandchildren; 9 great-grandchidren. She did work for [employer] after the boys were grown but is now retired. She lives alone with her husband.     reports that she has never smoked. She has never used smokeless tobacco. She reports that she does not drink alcohol. No history on file for drug use.  Allergies  Allergen Reactions  . Nexium [Esomeprazole Magnesium] Other (See Comments)    Headache    Family History  Problem Relation Age of Onset  . Leukemia Mother   .  Hypertension Father   . Diabetes Sister   . Heart disease Brother   . Diabetes Brother   . Diabetes Brother      Prior to Admission medications   Medication Sig Start Date End Date Taking? Authorizing Provider  amLODipine (NORVASC) 5 MG tablet Take 1 tablet (5 mg total) by mouth daily. 12/24/19  Yes McClanahan, Danton Sewer, NP  Ascorbic Acid (VITAMIN C) 1000 MG tablet Take 1,000 mg by mouth daily.   Yes [provider]  calcium carbonate (OSCAL) 1500 (600 Ca) MG TABS tablet Take 500 mg by mouth daily.   Yes [provider]  Cholecalciferol (VITAMIN D) 2000 UNITS tablet Take 4,000 Units by mouth daily.    Yes [provider]  Ferrous Sulfate (IRON) 325 (65 Fe) MG TABS Take 325 mg by mouth daily.    Yes [provider]  gabapentin (NEURONTIN) 100 MG capsule Take 1 capsule 3 x /day for Neuropathy Pain Patient not taking: Reported on 01/25/2020 08/02/19 01/25/20  Unk Pinto, MD    Physical Exam: Vitals:   01/25/20 2015 01/25/20 2030 01/25/20 2045 01/25/20 2100  BP: (!) 127/58 (!) 131/53 (!) 121/53 (!) 121/52  Pulse: 76 76 79 76  Resp: 16   18  Temp:      TempSrc:      SpO2: 100% 100% 100% 100%  Weight:      Height:         Vitals:   01/25/20 2015 01/25/20 2030 01/25/20 2045 01/25/20 2100  BP: (!) 127/58 (!) 131/53 (!) 121/53 (!) 121/52  Pulse: 76 76 79 76  Resp: 16   18  Temp:      TempSrc:      SpO2: 100% 100% 100% 100%  Weight:      Height:       General- WNWD woman in no distress Eyes: PERRL, lids and conjunctivae normal ENMT: Mucous membranes are moist. Posterior pharynx clear of any exudate or lesions.Normal dentition but missing many teeth - she wears a partial.  Neck: normal, supple, no masses, no thyromegaly Nodes: - no cervical, supraclavicular, axillary or inquinal adenopathy Respiratory: clear to auscultation bilaterally, no wheezing, no crackles. Normal respiratory effort. No accessory muscle use.  Cardiovascular: Regular rate and rhythm, no murmurs / rubs / gallops. No extremity edema. 2+ pedal pulses. No carotid bruits.  Abdomen: no tenderness, no masses palpated. Hepatomegaly with palpable liver edged with full exhalation and deep palpation. Bowel sounds positive.  Musculoskeletal: no clubbing / cyanosis. No joint deformity upper and lower extremities. Good ROM, no contractures. Normal muscle tone.  Skin: no rashes, lesions, ulcers. No induration Neurologic: CN 2-12 grossly intact. Sensation intact, DTR normal. Strength 5/5 in all 4.    Psychiatric: Normal judgment and insight. Alert and oriented x 3. Normal mood.     Labs on Admission: I have personally reviewed following labs and imaging studies  CBC: Recent Labs  Lab 01/22/20 1121 01/25/20 1221  WBC 26.3* 24.0*  NEUTROABS 3,235 2.9  HGB 7.7* 7.1*  HCT 22.6* 22.0*  MCV 93.8 99.5  PLT 149 235*   Basic Metabolic Panel: Recent Labs  Lab 01/22/20 1121 01/25/20 1221  NA 139 139  K 4.5 4.1  CL 102 103  CO2 25 26  GLUCOSE 91 111*  BUN 27* 21  CREATININE 1.26* 1.17*  CALCIUM 9.5 9.2   GFR: Estimated Creatinine Clearance: 32.4 mL/min (A) (by C-G formula based on SCr of 1.17 mg/dL (H)). Liver Function Tests: Recent Labs  Lab 01/22/20 1121 01/25/20 1221  AST 19 19  ALT 13 15  ALKPHOS  --  86  BILITOT 3.1* 3.0*  PROT 6.5 6.8  ALBUMIN  --  4.2   No results for input(s): LIPASE, AMYLASE in the last 168 hours. No results for input(s): AMMONIA in the last 168 hours. Coagulation Profile: No results for input(s): INR, PROTIME in the last 168 hours. Cardiac Enzymes: No results for input(s): CKTOTAL, CKMB, CKMBINDEX, TROPONINI in the last 168 hours. BNP (last 3 results) No results for input(s): PROBNP in the last 8760 hours. HbA1C: No results for input(s): HGBA1C in the last 72 hours. CBG: No results for input(s): GLUCAP in the last 168 hours. Lipid Profile: No results for input(s): CHOL, HDL, LDLCALC, TRIG, CHOLHDL, LDLDIRECT in the last 72 hours. Thyroid Function Tests: No results for input(s): TSH, T4TOTAL, FREET4, T3FREE, THYROIDAB in the last 72 hours. Anemia Panel: Recent Labs    01/25/20 1221  RETICCTPCT 7.9*   Urine analysis:    Component Value Date/Time   COLORURINE YELLOW 01/25/2020 1125   APPEARANCEUR CLEAR 01/25/2020 1125   LABSPEC 1.015 01/25/2020 1125   PHURINE 5.0 01/25/2020 1125   GLUCOSEU NEGATIVE 01/25/2020 1125   HGBUR NEGATIVE 01/25/2020 1125   [place] 01/25/2020 1125   [place] 01/25/2020 1125    PROTEINUR NEGATIVE 01/25/2020 1125   UROBILINOGEN 0.2 05/16/2014 1231   NITRITE NEGATIVE 01/25/2020 1125   LEUKOCYTESUR NEGATIVE 01/25/2020 1125    Radiological Exams on Admission: No results found.  EKG: Independently reviewed. No EKG in ED. Last EKG Sept 2021 - cannot access image.  Assessment/Plan Active Problems:   Lymphocytosis   Anemia due to chronic kidney disease   Hypertension   Hyperlipidemia   CKD (chronic kidney disease) stage 3, GFR 30-59 ml/min (HCC)   Anemia  (please populate well all problems here in Problem List. (For example, if patient is on BP meds at home and you resume or decide to hold them, it is a problem that needs to be her. Same for CAD, COPD, HLD and so on)   1. Lymphocytosis - progressive leukcytosis with lymphocytosis with associated anemia. Concern for hematologic disease, e.g. CLL. No adenopathy, minimal symptoms, no fevers, sweats, chills, weight loss. Plan Flow cytometry  Hematology consult  2. Anemia - patient has mild chronic anemia which has become much worse over the past several weeks. Etiology mild CKD, iron deficiency and #1 Plan  minimal symptoms of weakness and continue drop in Hgb to a nadir of 7.1 - will transfuse 1 uPRBC  3. HTN - contine home meds  4. CKD 3 - stable  DVT prophylaxis: lovenox  Code Status: full code  Family Communication: spoke with son Shanon Brow. Explained Dx and Tx plan. Answered his questions.) Disposition Plan: home 24-48 hrs  Consults called: hematology - called [facility] nurse line  Admission status: obs    Adella Hare MD [facility] Pager [phone]  If 7PM-7AM, please contact night-coverage [email or web address] Password [email or web address]  01/25/2020, 10:22 PM

## 2020-01-25 NOTE — ED Provider Notes (Signed)
  Face-to-face evaluation   History: She presents for evaluation of general fatigue which causes her to have problems during her day-to-day activities.  She has been seeing her PCP for evaluation and he has ordered labs including fecal occult.  Physical exam: Alert elderly female who is cooperative and calm.  No dysarthria or aphasia.  No respiratory distress.  Medical screening examination/treatment/procedure(s) were conducted as a shared visit with non-physician practitioner(s) and myself.  I personally evaluated the patient during the encounter    Daleen Bo, MD 01/26/20 [phone]

## 2020-01-25 NOTE — ED Notes (Signed)
IV team at bedside 

## 2020-01-25 NOTE — ED Provider Notes (Signed)
[facility] DEPT Provider Note   CSN: [id number] Arrival date & time: 01/25/20  1517     History Chief Complaint  Patient presents with  . Abnormal Lab    Jacqueline Hancock is a 79 y.o. female.  Patient with history of hypertension, chronic kidney disease --presents the emergency department for evaluation of low blood counts at the urging of her primary provider.  It appears that the patient had a routine evaluation on 12/24/2019.  At that time it was noted that her hemoglobin was 10.3 and her white blood cell count was 18.3 with lymphocytosis (10.3) and monocytosis (5.4).  Patient had labs rechecked on 9/27.  Hemoglobin was then 9.8 with white blood cell count of 18.6.  She then had Hemoccult testing ordered which was negative x3.  On 10/20 hemoglobin was then 7.7 with a white blood cell count of 26.3, 13.5 lymphocytes and 9.5 monocytes.  Patient was called and encouraged to come to the emergency department.  She is asymptomatic except for fatigue over the past week.  She states that she lives at home with her husband who has dementia and she is the primary caregiver.  She denies bloody or black stools, easy bruising or bleeding, blood in her urine.  No fevers, chills or other infectious symptoms.  No chest pain, shortness of breath.  She has had chronic abdominal pain without acute worsening, fatty liver diagnosed about a year ago.        Past Medical History:  Diagnosis Date  . Early cataracts, bilateral   . Elevated hemoglobin A1c   . Fibroids   . GERD (gastroesophageal reflux disease)   . Hyperlipidemia   . Hypertension   . Osteopenia     Patient Active Problem List   Diagnosis Date Noted  . CKD (chronic kidney disease) stage 3, GFR 30-59 ml/min (HCC) 09/10/2015  . Vitamin D deficiency 06/20/2013  . Medication management 06/20/2013  . Hypertension   . Hyperlipidemia   . Abnormal glucose   . GERD (gastroesophageal reflux disease)   . Osteopenia       Past Surgical History:  Procedure Laterality Date  . ABDOMINAL HYSTERECTOMY    . APPENDECTOMY    . CATARACT EXTRACTION, BILATERAL Bilateral 09/2017   Dr. Richardean Sale  . 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 TONSILLECTOMY AND ADENOIDECTOMY       OB History   No obstetric history on file.     Family History  Problem Relation Age of Onset  . Leukemia Mother   . Hypertension Father   . Diabetes Sister   . Heart disease Brother   . Diabetes Brother   . Diabetes Brother     Social History   Tobacco Use  . Smoking status: Never Smoker  . Smokeless tobacco: Never Used  Substance Use Topics  . Alcohol use: No  . Drug use: Not on file    Home Medications Prior to Admission medications   Medication Sig Start Date End Date Taking? Authorizing Provider  amLODipine (NORVASC) 5 MG tablet Take 1 tablet (5 mg total) by mouth daily. 12/24/19   Garnet Sierras, NP  Ascorbic Acid (VITAMIN C) 1000 MG tablet Take 1,000 mg by mouth daily.    [provider]  calcium carbonate (OSCAL) 1500 (600 Ca) MG TABS tablet Take 500 mg by mouth daily.    [provider]  Cholecalciferol (VITAMIN D) 2000 UNITS tablet Take 4,000 Units by mouth daily.  [provider]  Ferrous Sulfate (IRON) 325 (65 Fe) MG TABS Take by mouth daily.    [provider]  gabapentin (NEURONTIN) 100 MG capsule Take 1 capsule 3 x /day for Neuropathy Pain 08/02/19   Unk Pinto, MD    Allergies    Nexium [esomeprazole magnesium]  Review of Systems   Review of Systems  Constitutional: Positive for fatigue. Negative for fever.  HENT: Negative for rhinorrhea and sore throat.   Eyes: Negative for redness.  Respiratory: Negative for cough.   Cardiovascular: Negative for chest pain.  Gastrointestinal: Negative for abdominal pain, blood in stool, diarrhea, nausea and vomiting.  Genitourinary: Negative for dysuria, frequency, hematuria and urgency.  Musculoskeletal:  Negative for myalgias.  Skin: Negative for rash.  Neurological: Negative for headaches.    Physical Exam Updated Vital Signs BP 122/63 (BP Location: Left Arm)   Pulse 79   Temp 99 F (37.2 C) (Oral)   Resp 16   Ht 4\' 11"  (1.499 m)   Wt 66.7 kg   SpO2 100%   BMI 29.69 kg/m   Physical Exam Vitals and nursing note reviewed.  Constitutional:      General: She is not in acute distress.    Appearance: She is well-developed.  HENT:     Head: Normocephalic and atraumatic.     Right Ear: External ear normal.     Left Ear: External ear normal.     Nose: Nose normal.  Eyes:     Comments: Pale conjunctiva  Cardiovascular:     Rate and Rhythm: Normal rate and regular rhythm.     Heart sounds: No murmur heard.   Pulmonary:     Effort: No respiratory distress.     Breath sounds: No wheezing, rhonchi or rales.  Abdominal:     Palpations: Abdomen is soft.     Tenderness: There is no abdominal tenderness. There is no guarding or rebound.  Musculoskeletal:     Cervical back: Normal range of motion and neck supple.     Right lower leg: No edema.     Left lower leg: No edema.  Skin:    General: Skin is warm and dry.     Findings: No rash.  Neurological:     General: No focal deficit present.     Mental Status: She is alert. Mental status is at baseline.     Motor: No weakness.  Psychiatric:        Mood and Affect: Mood normal.     ED Results / Procedures / Treatments   Labs (all labs ordered are listed, but only abnormal results are displayed) Labs Reviewed  CBC WITH DIFFERENTIAL/PLATELET - Abnormal; Notable for the following components:      Result Value   WBC 24.0 (*)    RBC 2.21 (*)    Hemoglobin 7.1 (*)    HCT 22.0 (*)    RDW 18.6 (*)    Platelets 141 (*)    Lymphs Abs 14.3 (*)    Monocytes Absolute 6.6 (*)    All other components within normal limits  COMPREHENSIVE METABOLIC PANEL - Abnormal; Notable for the following components:   Glucose, Bld 111 (*)     Creatinine, Ser 1.17 (*)    Total Bilirubin 3.0 (*)    GFR, Estimated 47 (*)    All other components within normal limits  RETICULOCYTES - Abnormal; Notable for the following components:   Retic Ct Pct 7.9 (*)    RBC. 2.17 (*)  Immature Retic Fract 27.0 (*)    All other components within normal limits  URINALYSIS, ROUTINE W REFLEX MICROSCOPIC  PATHOLOGIST SMEAR REVIEW  TYPE AND SCREEN  PREPARE RBC (CROSSMATCH)    EKG None  Radiology No results found.  Procedures Procedures (including critical care time)  Medications Ordered in ED Medications - No data to display  ED Course  I have reviewed the triage vital signs and the nursing notes.  Pertinent labs & imaging results that were available during my care of the patient were reviewed by me and considered in my medical decision making (see chart for details).  Patient seen and examined. Work-up initiated. Medications ordered.   Vital signs reviewed and are as follows: BP 122/63 (BP Location: Left Arm)   Pulse 79   Temp 99 F (37.2 C) (Oral)   Resp 16   Ht 4\' 11"  (1.499 m)   Wt 66.7 kg   SpO2 100%   BMI 29.69 kg/m   1:17 PM patient discussed with Dr. Eulis Foster who will see.  I have spoken with Dr. Alen Blew of hematology.  We reviewed labs and previously ordered leukemia/lymphoma eval panel.  Regarding blood work, patient can be followed up by hematology as outpatient.   1:39 PM I had a discussion with patient and son at bedside.  Discussed need to follow-up closely with PCP and hematology as an outpatient.  Patient would like to be at home to help care for her husband with dementia.  We discussed plan to give her a unit of blood here to help boost her hemoglobin and allow her to go home.  Risks and benefits discussed.  She would like to proceed.  4:38 PM Pt stable. Continuing to await blood transfusion.   6:00 PM Signout to partner at shift change.     MDM Rules/Calculators/A&P                          Patient with  progressing anemia, leukocytosis. Low concern for GI bleeding given history and recent work-up.  Discussed with heme/onc today. Given unit of blood here. Plan for close outpatient f/u with PCP and hematology.    Final Clinical Impression(s) / ED Diagnoses Final diagnoses:  Symptomatic anemia  Lymphocytosis    Rx / DC Orders ED Discharge Orders    None       Carlisle Cater, PA-C 01/25/20 1739    Daleen Bo, MD 01/26/20 [phone]

## 2020-01-25 NOTE — ED Triage Notes (Signed)
Pt states that she was sent in by her PCP after having her hemoglobin drop from a 9 in September to 7.7 on Wednesday. Pt states she has not had dark stools and that her labs showed she did not have blood in her stool. Alert and oriented. Endorses fatigue.

## 2020-01-26 DIAGNOSIS — D6489 Other specified anemias: Secondary | ICD-10-CM | POA: Diagnosis not present

## 2020-01-26 DIAGNOSIS — D72820 Lymphocytosis (symptomatic): Secondary | ICD-10-CM | POA: Diagnosis not present

## 2020-01-26 LAB — CBC WITH DIFFERENTIAL/PLATELET
Abs Immature Granulocytes: 0.06 10*3/uL (ref 0.00–0.07)
Basophils Absolute: 0.1 10*3/uL (ref 0.0–0.1)
Basophils Relative: 0 %
Eosinophils Absolute: 0.1 10*3/uL (ref 0.0–0.5)
Eosinophils Relative: 0 %
HCT: 24.3 % — ABNORMAL LOW (ref 36.0–46.0)
Hemoglobin: 7.9 g/dL — ABNORMAL LOW (ref 12.0–15.0)
Immature Granulocytes: 0 %
Lymphocytes Relative: 60 %
Lymphs Abs: 14.6 10*3/uL — ABNORMAL HIGH (ref 0.7–4.0)
MCH: 32.2 pg (ref 26.0–34.0)
MCHC: 32.5 g/dL (ref 30.0–36.0)
MCV: 99.2 fL (ref 80.0–100.0)
Monocytes Absolute: 7.7 10*3/uL — ABNORMAL HIGH (ref 0.1–1.0)
Monocytes Relative: 31 %
Neutro Abs: 2.3 10*3/uL (ref 1.7–7.7)
Neutrophils Relative %: 9 %
Platelets: 168 10*3/uL (ref 150–400)
RBC: 2.45 MIL/uL — ABNORMAL LOW (ref 3.87–5.11)
RDW: 18.8 % — ABNORMAL HIGH (ref 11.5–15.5)
WBC: 24.8 10*3/uL — ABNORMAL HIGH (ref 4.0–10.5)
nRBC: 0 % (ref 0.0–0.2)

## 2020-01-26 LAB — BASIC METABOLIC PANEL
Anion gap: 13 (ref 5–15)
BUN: 21 mg/dL (ref 8–23)
CO2: 26 mmol/L (ref 22–32)
Calcium: 9.9 mg/dL (ref 8.9–10.3)
Chloride: 106 mmol/L (ref 98–111)
Creatinine, Ser: 1.16 mg/dL — ABNORMAL HIGH (ref 0.44–1.00)
GFR, Estimated: 48 mL/min — ABNORMAL LOW (ref >60)
Glucose, Bld: 105 mg/dL — ABNORMAL HIGH (ref 70–99)
Potassium: 5 mmol/L (ref 3.5–5.1)
Sodium: 145 mmol/L (ref 135–145)

## 2020-01-26 LAB — ABO/RH: ABO/RH(D): B POS

## 2020-01-26 MED ORDER — DIPHENHYDRAMINE HCL 25 MG PO CAPS: 25.0000 mg | ORAL_CAPSULE | Freq: Once | ORAL | Status: DC

## 2020-01-26 MED ORDER — ACETAMINOPHEN 325 MG PO TABS: 650.0000 mg | ORAL_TABLET | Freq: Once | ORAL | Status: DC

## 2020-01-26 NOTE — Discharge Instructions (Signed)
Anemia  Anemia is a condition in which you do not have enough red blood cells or hemoglobin. Hemoglobin is a substance in red blood cells that carries oxygen. When you do not have enough red blood cells or hemoglobin (are anemic), your body cannot get enough oxygen and your organs may not work properly. As a result, you may feel very tired or have other problems. What are the causes? Common causes of anemia include:  Excessive bleeding. Anemia can be caused by excessive bleeding inside or outside the body, including bleeding from the intestine or from periods in women.  Poor nutrition.  Long-lasting (chronic) kidney, thyroid, and liver disease.  Bone marrow disorders.  Cancer and treatments for cancer.  HIV (human immunodeficiency virus) and AIDS (acquired immunodeficiency syndrome).  Treatments for HIV and AIDS.  Spleen problems.  Blood disorders.  Infections, medicines, and autoimmune disorders that destroy red blood cells. What are the signs or symptoms? Symptoms of this condition include:  Minor weakness.  Dizziness.  Headache.  Feeling heartbeats that are irregular or faster than normal (palpitations).  Shortness of breath, especially with exercise.  Paleness.  Cold sensitivity.  Indigestion.  Nausea.  Difficulty sleeping.  Difficulty concentrating. Symptoms may occur suddenly or develop slowly. If your anemia is mild, you may not have symptoms. How is this diagnosed? This condition is diagnosed based on:  Blood tests.  Your medical history.  A physical exam.  Bone marrow biopsy. Your health care provider may also check your stool (feces) for blood and may do additional testing to look for the cause of your bleeding. You may also have other tests, including:  Imaging tests, such as a CT scan or MRI.  Endoscopy.  Colonoscopy. How is this treated? Treatment for this condition depends on the cause. If you continue to lose a lot of blood, you  may need to be treated at a hospital. Treatment may include:  Taking supplements of iron, vitamin T01, or folic acid.  Taking a hormone medicine (erythropoietin) that can help to stimulate red blood cell growth.  Having a blood transfusion. This may be needed if you lose a lot of blood.  Making changes to your diet.  Having surgery to remove your spleen. Follow these instructions at home:  Take over-the-counter and prescription medicines only as told by your health care provider.  Take supplements only as told by your health care provider.  Follow any diet instructions that you were given.  Keep all follow-up visits as told by your health care provider. This is important. Contact a health care provider if:  You develop new bleeding anywhere in the body. Get help right away if:  You are very weak.  You are short of breath.  You have pain in your abdomen or chest.  You are dizzy or feel faint.  You have trouble concentrating.  You have bloody or black, tarry stools.  You vomit repeatedly or you vomit up blood. Summary  Anemia is a condition in which you do not have enough red blood cells or enough of a substance in your red blood cells that carries oxygen (hemoglobin).  Symptoms may occur suddenly or develop slowly.  If your anemia is mild, you may not have symptoms.  This condition is diagnosed with blood tests as well as a medical history and physical exam. Other tests may be needed.  Treatment for this condition depends on the cause of the anemia. This information is not intended to replace advice given to you  by your health care provider. Make sure you discuss any questions you have with your health care provider. Document Revised: 03/03/2017 Document Reviewed: 04/22/2016 Elsevier Patient Education  2020 [place].   Managing Low Blood Counts During Cancer Treatment Cancer treatments such as chemotherapy and radiation can sometimes cause a drop in the  supply of blood cells in the body, including red blood cells, white blood cells, and platelets. These blood cells are produced in the body and are released into the blood to perform specific functions:  Red blood cells carry gases such as oxygen and carbon dioxide to and from your lungs.  White blood cells help protect you from infection.  Platelets help your body to form blood clots to prevent and control bleeding. When cancer treatments cause a drop in blood cell counts, your body may not have enough cells to keep up its normal functions. Symptoms or problems that may result will vary depending on which type of blood cells the treatment is affecting. If your blood counts are low, you can take steps to help manage any problems. How can low blood counts affect me? Low blood counts have various effects depending on the type of blood cells involved:  If you have a low number of red blood cells, you have a condition called anemia. This can cause symptoms such as: ? Feeling tired and weak. ? Feeling light-headed. ? Being short of breath.  If you have a low number of white blood cells, you may be at higher risk for infections.  If you have a low number of platelets, you may bleed more easily, or your body may have trouble stopping any bleeding. You may also have more bruising. How to manage symptoms or prevent problems from a low blood count If you have a low blood count, you can take steps to manage symptoms or prevent problems that may develop. The steps to take will depend on which type of blood cell is low. Low red blood cells Take these steps to help manage the symptoms of anemia:  Go for a walk or do some light exercise each day.  Take short naps during the day.  Eat foods that contain a lot of iron and protein. These include leafy vegetables, meat and fish, beans, sweet potatoes, and dried fruit such as prunes, raisins, and apricots.  Ask for help with errands and with work that needs  to be done around the house. It is important to save your energy.  Take vitamins or supplements--such as iron, vitamin Z61, or folic acid--as told by your health care provider.  Practice relaxation techniques, such as yoga or meditation. Low white blood cells Take these steps to help prevent infections:  Wash your hands often with warm, soapy water.  Avoid crowds of people and any person who has the flu or a fever.  Take care when cleaning yourself after using the bathroom. Tell your health care provider if you have any rectal sores or bleeding.  Avoid dental work. Check your mouth each day for sores or signs of infection.  Do not share utensils.  Avoid contact with pet waste. Wash your hands after handling pets.  If you get a scrape or cut, clean it thoroughly right away.  Avoid fresh plants or dried flowers.  Do not swim or wade in lakes, ponds, rivers, water parks, or hot tubs.  Follow food safety guidelines. Cook meat thoroughly and wash all raw fruits and vegetables.  You may be instructed to wear a  mask when around others to protect yourself.  Low platelets Take these steps to help prevent or control bleeding and bruising:  Use an electric razor for shaving instead of a blade.  Use a soft toothbrush and be careful during oral care. Talk with your cancer care team about whether you should avoid flossing. If your mouth is bleeding, rinse it with ice water.  Avoid activities that could cause injury, such as contact sports.  Talk with your health care provider about using laxatives or stool softeners to avoid constipation.  Do not use medicines such as ibuprofen, aspirin, or naproxen unless your health care provider tells you to.  Limit alcohol use.  Monitor any bleeding closely. If you start bleeding, hold pressure on the area for 5 minutes to stop the bleeding. Bleeding that does not stop is considered an emergency. What treatments can help increase a low blood  count? If needed, your health care provider may recommend treatment for a low blood count. Treatment will depend on the type of blood cell that is low and the severity of your condition. Treatment options may include:  Taking medicines to help stimulate the growth of blood cells. This is an option for treating a low red blood cell count. Your health care provider may also recommend that you take iron, folic acid, or vitamin B12 supplements.  Making dietary changes. Including more iron and protein in your diet can help stimulate the growth of red blood cells.  Adjusting your current medicines to help raise blood counts.  Making changes to your treatment plan.  Having a blood transfusion. This may be done if your blood count is very low. Contact a health care provider if:  You feel extremely tired and weak.  You have more bruising or bleeding.  You feel ill or you develop a cough.  You have swelling or redness.  You have mouth sores or a sore throat.  You have painful urination or you have blood in your urine or stool.  You are thinking of taking any new supplements or vitamins or making dietary changes. Get help right away if:  You are short of breath, have chest pain, or feel dizzy.  You have a fever or chills.  You have abdominal pain or diarrhea.  You have bleeding that will not stop. Summary  Cancer treatments such as chemotherapy and radiation can sometimes cause a drop in the supply of blood cells in the body, including red blood cells, white blood cells, and platelets.  If you have a low blood count, you can take steps to manage symptoms or prevent problems that may develop.  Depending on which type of blood cell is low, you may need to take steps to prevent infection, prevent bleeding, or manage symptoms that may develop.  If needed, your health care provider may recommend treatment for a low blood count. This information is not intended to replace advice given to  you by your health care provider. Make sure you discuss any questions you have with your health care provider. Document Revised: 04/07/2016 Document Reviewed: 11/14/2015 Elsevier Patient Education  2020 [place].   Aplastic Anemia Aplastic anemia occurs when soft tissue inside of bones (bone marrow) stops making enough blood cells. There are three types of blood cells:  Red blood cells. These carry oxygen to the tissues of the body.  White blood cells. These fight infection.  Platelets. These help your blood to clot when you have an injury. Aplastic anemia is a rare  and serious condition that may develop slowly or rapidly. Even after treatment, it is necessary to be monitored for problems that can come back (recur). What are the causes? This condition may be caused by anything that injures bone marrow, such as:  Radiation and chemotherapy treatment for cancer. These treatments are used to kill cancer cells, but they also damage healthy cells.  Exposure to poisonous (toxic) chemicals that are used in some pesticides and insecticides.  Certain medicines, such as medicines used to treat rheumatoid arthritis.  Conditions in which the body's disease-fighting system (immune system) attacks the body's own cells (autoimmune disorders).  Viral infections, including hepatitis.  Pregnancy. This is a rare cause of aplastic anemia, and it may be related to an autoimmune problem that affects bone marrow. Sometimes, the cause is not known. What are the signs or symptoms? Symptoms of this condition include:  Shortness of breath.  Fatigue.  Light-headedness or fainting.  Shortness of breath and rapid heart rate, especially with physical activity.  Pale skin and lips.  Frequent infections.  Bruising and bleeding easily.  Nosebleeds and bleeding gums.  Prolonged bleeding from cuts.  Severe bleeding during menstrual periods in women.  Sore mouth.  Bacterial or fungal  infections. How is this diagnosed? This condition is diagnosed based on:  Your symptoms.  Your medical history.  Blood tests.  Removal of a bone marrow sample (biopsy) for testing. You may have more tests to find the underlying cause of your aplastic anemia. How is this treated? Treatment for aplastic anemia depends on the severity of the condition. For mild cases, observation is needed. In severe cases, or if complications develop, hospitalization may be necessary. Severe aplastic anemia is life-threatening. Treatment may include:  Receiving donated blood through an IV tube (blood transfusion).  Medicines: ? To reduce the activity of (suppress) the immune system, if you have an autoimmune disorder. ? To stimulate bone marrow to make more blood cells.  Antibiotic medicines to prevent infection.  Receiving healthy bone marrow from a donor (bone marrow transplant), if your condition is severe. After the transplant, you will need to take medicines to help prevent your body from rejecting the new marrow. If the procedure is successful, the transplanted marrow will begin to produce new blood cells. Your health care provider will determine whether you are a candidate for bone marrow transplant. Follow these instructions at home:  Medicines  Take over-the-counter and prescription medicines only as told by your health care provider.  If you were prescribed an antibiotic medicine, take it as told by your health care provider. Do not stop taking the antibiotic even if you start to feel better. General instructions  Get plenty of rest, and eat a healthy, well-balanced diet.  Avoid excessive exercise. Long-term anemia can put stress on the heart. Ask your health care provider what types of exercise are best for you.  When platelets are at low levels, avoid all activities that put you at risk for injury. This is important because your risk of bleeding is greater. Ask your health care provider  what activities are safe for you when your platelets are low.  Protect yourself from infections: ? Wash your hands often with soap and water. If soap and water are not available, use hand sanitizer. ? Avoid crowds. ? Avoid contact with sick people.  Keep all follow-up visits as told by your health care provider. This is important. How is this prevented?  Avoid exposure to toxic chemicals such as: ? Insecticides. ?  Herbicides. ? Organic solvents. ? Paint removers.  Take steps to protect yourself from infections. Contact a health care provider if:  You develop mouth sores.  You have a fever or other symptoms that last for more than 2-3 days.  You develop flu-like symptoms.  You are bruising easily.  You develop signs of an infection.  You have blood in your urine or your stool (feces).  You are bleeding from your gums or nose.  You develop infections more often than usual. Get help right away if:  You have a fever and your symptoms suddenly get worse.  You have prolonged bleeding from cuts.  You have shortness of breath that gets worse.  You have chest pain or a rapid heart rate when you do physical activity.  You have increasing fatigue and tiredness.  You become light-headed or you faint.  You develop pale skin and lips.  You cough up blood. Summary  Aplastic anemia occurs when soft tissue inside of the bones (bone marrow) stops making enough blood cells.  Treatment for aplastic anemia depends on the severity of the condition, and it may involve medicines or transfusions.  In severe cases, or if complications develop, hospitalization may be necessary. This information is not intended to replace advice given to you by your health care provider. Make sure you discuss any questions you have with your health care provider. Document Revised: 03/03/2017 Document Reviewed: 11/16/2015 Elsevier Patient Education  [place]. Please read and follow all  provided instructions.  Your diagnoses today include:  1. Symptomatic anemia   2. Lymphocytosis     Tests performed today include: Blood cell counts - low red blood cell counts and high white blood cell counts  Electrolytes and kidney function - no acute problems  Vital signs. See below for your results today.   Medications prescribed:   None  Take any prescribed medications only as directed.  Home care instructions:  Follow any educational materials contained in this packet.  BE VERY CAREFUL not to take multiple medicines containing Tylenol (also called acetaminophen). Doing so can lead to an overdose which can damage your liver and cause liver failure and possibly death.   Follow-up instructions: Call hematologist (blood doctor) and your primary care doctor on Monday to schedule follow-up appointments.  Return instructions:   Please return to the Emergency Department if you experience worsening symptoms.   Please return if you have any other emergent concerns.  Additional Information:  Your vital signs today were: BP (!) 124/57   Pulse 70   Temp 99 F (37.2 C) (Oral)   Resp 18   Ht $R'4\' 11"'VK$  (1.499 m)   Wt 66.7 kg   SpO2 100%   BMI 29.69 kg/m  If your blood pressure (BP) was elevated above 135/85 this visit, please have this repeated by your doctor within one month. --------------

## 2020-01-26 NOTE — Discharge Summary (Addendum)
Physician Discharge Summary  LALIA LOUDON TJQ:[id number] DOB: [birth year] DOA: 01/25/2020  PCP: Unk Pinto, MD  Admit date: 01/25/2020 Discharge date: 01/26/2020  Admitted From: Home Disposition:  Home  Discharge Condition:Stable CODE STATUS:Full Diet recommendation:  Heart healthy  Brief/Interim Summary:  HPI ( as per Dr Linda Hedges): Jacqueline Hancock is a 79 y.o. female with medical history significant of hypertension, chronic kidney disease --presents the emergency department for evaluation of low blood counts at the urging of her primary provider. It appears that the patient had a routine evaluation on 12/24/2019. At that time it was noted that her hemoglobin was 10.3 and her white blood cell count was 18.3 with lymphocytosis (10.3) and monocytosis (5.4). Patient had labs rechecked on 9/27. Hemoglobin was then 9.8 with white blood cell count of 18.6. She then had Hemoccult testing ordered which was negative x3. On 10/20 hemoglobin was then 7.7 with a white blood cell count of 26.3, 13.5 lymphocytes and 9.5 monocytes. Patient was called and encouraged to come to the emergency department. She is asymptomatic except for fatigue over the past week. She states that she lives at home with her husband who has dementia and she is the primary caregiver. She denies bloody or black stools, easy bruising or bleeding, blood in her urine. No fevers, chills or other infectious symptoms. No chest pain, shortness of breath. She has had chronic abdominal pain without acute worsening, fatty liver diagnosed about a year ago. Type and cross match ordered but the patient has unusual antibodies and there will be a delay in getting blood. [facility] called to admit the patient for further evaluation of anemia and lymphocytosis.  ED Course: T 98.3  121/52  76  18. Exam by ED-PA unremarkable. Lab: Cmet with Glucose 11, Cr 1.17 (1.26 10/20, 1.22 12/24/19). WBC 24k with 12% neeutrophils, 60 % lymphocytes, 28% monocytes.  Machine read: smudge cells, polychromasia and abnormal lymphocytes. Pathology review of blood smear ordered and pending. Retic % 7.9%, abs Retic 172.1. U/A negative.  Hospital course:  Her hospital course remained stable.  She remained hemodynamically stable.  Her hemoglobin is 7.9 today.  She denies any dizziness or lightheadedness and feels good and wants to go home. I discussed her case with Dr. Alen Blew, oncology, who recommended to follow-up as an outpatient in a week.  Following problems were addressed during hospitalization:  1. Lymphocytosis - progressive leukcytosis with lymphocytosis with associated anemia. Concern for hematologic disease, e.g. CLL. No adenopathy, minimal symptoms, no fevers, sweats, chills, weight loss. Case discussed with hematology/oncology and outpatient follow-up recommended.  2. Normocytic Anemia - patient has mild chronic anemia which has become much worse over the past several weeks. Etiology mild CKD, iron deficiency and #1 Her hb is stable at 7.9 today..  I recommend to follow-up with her PCP or oncology as outpatient and do blood work to check your hemoglobin a week.  3. HTN - Contine home meds  4. CKD 3 - Stable  Discharge Diagnoses:  Active Problems:   Hypertension   Hyperlipidemia   CKD (chronic kidney disease) stage 3, GFR 30-59 ml/min (HCC)   Lymphocytosis   Anemia due to chronic kidney disease   Anemia    Discharge Instructions  Discharge Instructions    Diet - low sodium heart healthy   Complete by: As directed    Discharge instructions   Complete by: As directed    1)Please follow-up with oncology as an outpatient as soon as possible.  Name and number of  the provider has been attached. 2) Do a CBC test in a week to check your hemoglobin.   Increase activity slowly   Complete by: As directed      Allergies as of 01/26/2020      Reactions   Nexium [esomeprazole Magnesium] Other (See Comments)   Headache      Medication List     STOP taking these medications   gabapentin 100 MG capsule Commonly known as: NEURONTIN     TAKE these medications   amLODipine 5 MG tablet Commonly known as: NORVASC Take 1 tablet (5 mg total) by mouth daily.   calcium carbonate 1500 (600 Ca) MG Tabs tablet Commonly known as: OSCAL Take 500 mg by mouth daily.   Iron 325 (65 Fe) MG Tabs Take 325 mg by mouth daily.   vitamin C 1000 MG tablet Take 1,000 mg by mouth daily.   Vitamin D 50 MCG (2000 UT) tablet Take 4,000 Units by mouth daily.       Follow-up Information    Wyatt Portela, MD.   Specialty: Oncology Why: Call on Monday and schedule an appointment Contact information: [place] [address] [phone]        Unk Pinto, MD.   Specialty: Internal Medicine Why: Call on Monday and schedule an appointment. You will need additional checks of your red and white blood cell counts. Contact information: [address] [place] [address] [phone]              Allergies  Allergen Reactions  . Nexium [Esomeprazole Magnesium] Other (See Comments)    Headache    Consultations:  Oncology   Procedures/Studies: No results found.    Subjective: Patient seen and examined at the bedside this morning.  Hemodynamically stable for discharge today.  Discharge Exam: Vitals:   01/26/20 1205 01/26/20 1505  BP: (!) 119/57 138/62  Pulse:  79  Resp:  16  Temp:  99.6 F (37.6 C)  SpO2:  100%   Vitals:   01/26/20 0619 01/26/20 1102 01/26/20 1205 01/26/20 1505  BP: (!) 105/49 (!) 119/57 (!) 119/57 138/62  Pulse: 70 77  79  Resp: 14 16  16   Temp: 99 F (37.2 C) 98.4 F (36.9 C)  99.6 F (37.6 C)  TempSrc: Oral Oral  Oral  SpO2: 100% 99%  100%  Weight:      Height:        General: Pt is alert, awake, not in acute distress Cardiovascular: RRR, S1/S2 +, no rubs, no gallops Respiratory: CTA bilaterally, no wheezing, no rhonchi Abdominal:  Soft, NT, ND, bowel sounds + Extremities: no edema, no cyanosis    The results of significant diagnostics from this hospitalization (including imaging, microbiology, ancillary and laboratory) are listed below for reference.     Microbiology: Recent Results (from the past 240 hour(s))  Respiratory Panel by RT PCR (Flu A&B, Covid) - Nasopharyngeal Swab     Status: None   Collection Time: 01/25/20  8:18 PM   Specimen: Nasopharyngeal Swab  Result Value Ref Range Status   SARS Coronavirus 2 by RT PCR NEGATIVE NEGATIVE Final    Comment: (NOTE) SARS-CoV-2 target nucleic acids are NOT DETECTED.  The SARS-CoV-2 RNA is generally detectable in upper respiratoy specimens during the acute phase of infection. The lowest concentration of SARS-CoV-2 viral copies this assay can detect is 131 copies/mL. A negative result does not preclude SARS-Cov-2 infection and should not be used as the sole basis  for treatment or other patient management decisions. A negative result may occur with  improper specimen collection/handling, submission of specimen other than nasopharyngeal swab, presence of viral mutation(s) within the areas targeted by this assay, and inadequate number of viral copies (<131 copies/mL). A negative result must be combined with clinical observations, patient history, and epidemiological information. The expected result is Negative.  Fact Sheet for Patients:  PinkCheek.be  Fact Sheet for Healthcare Providers:  GravelBags.it  This test is no t yet approved or cleared by the [place] FDA and  has been authorized for detection and/or diagnosis of SARS-CoV-2 by FDA under an Emergency Use Authorization (EUA). This EUA will remain  in effect (meaning this test can be used) for the duration of the COVID-19 declaration under Section 564(b)(1) of the Act, 21 U.S.C. section 360bbb-3(b)(1), unless the authorization is terminated  or revoked sooner.     Influenza A by PCR NEGATIVE NEGATIVE Final   Influenza B by PCR NEGATIVE NEGATIVE Final    Comment: (NOTE) The Xpert Xpress SARS-CoV-2/FLU/RSV assay is intended as an aid in  the diagnosis of influenza from Nasopharyngeal swab specimens and  should not be used as a sole basis for treatment. Nasal washings and  aspirates are unacceptable for Xpert Xpress SARS-CoV-2/FLU/RSV  testing.  Fact Sheet for Patients: PinkCheek.be  Fact Sheet for Healthcare Providers: GravelBags.it  This test is not yet approved or cleared by the [place] FDA and  has been authorized for detection and/or diagnosis of SARS-CoV-2 by  FDA under an Emergency Use Authorization (EUA). This EUA will remain  in effect (meaning this test can be used) for the duration of the  Covid-19 declaration under Section 564(b)(1) of the Act, 21  U.S.C. section 360bbb-3(b)(1), unless the authorization is  terminated or revoked. Performed at [facility], [place] [address], [place], [place] [address]      Labs: BNP (last 3 results) No results for input(s): BNP in the last 8760 hours. Basic Metabolic Panel: Recent Labs  Lab 01/22/20 1121 01/25/20 1221 01/26/20 0841  NA 139 139 145  K 4.5 4.1 5.0  CL 102 103 106  CO2 25 26 26   GLUCOSE 91 111* 105*  BUN 27* 21 21  CREATININE 1.26* 1.17* 1.16*  CALCIUM 9.5 9.2 9.9   Liver Function Tests: Recent Labs  Lab 01/22/20 1121 01/25/20 1221  AST 19 19  ALT 13 15  ALKPHOS  --  86  BILITOT 3.1* 3.0*  PROT 6.5 6.8  ALBUMIN  --  4.2   No results for input(s): LIPASE, AMYLASE in the last 168 hours. No results for input(s): AMMONIA in the last 168 hours. CBC: Recent Labs  Lab 01/22/20 1121 01/25/20 1221 01/26/20 0841  WBC 26.3* 24.0* 24.8*  NEUTROABS 3,235 2.9 2.3  HGB 7.7* 7.1* 7.9*  HCT 22.6* 22.0* 24.3*  MCV 93.8 99.5 99.2  PLT 149 141* 168   Cardiac  Enzymes: No results for input(s): CKTOTAL, CKMB, CKMBINDEX, TROPONINI in the last 168 hours. BNP: Invalid input(s): POCBNP CBG: No results for input(s): GLUCAP in the last 168 hours. D-Dimer No results for input(s): DDIMER in the last 72 hours. Hgb A1c No results for input(s): HGBA1C in the last 72 hours. Lipid Profile No results for input(s): CHOL, HDL, LDLCALC, TRIG, CHOLHDL, LDLDIRECT in the last 72 hours. Thyroid function studies No results for input(s): TSH, T4TOTAL, T3FREE, THYROIDAB in the last 72 hours.  Invalid input(s): FREET3 Anemia work up [employer]    01/25/20  1221  RETICCTPCT 7.9*   Urinalysis    Component Value Date/Time   COLORURINE YELLOW 01/25/2020 1125   APPEARANCEUR CLEAR 01/25/2020 1125   LABSPEC 1.015 01/25/2020 1125   PHURINE 5.0 01/25/2020 1125   GLUCOSEU NEGATIVE 01/25/2020 1125   HGBUR NEGATIVE 01/25/2020 1125   BILIRUBINUR NEGATIVE 01/25/2020 1125   KETONESUR NEGATIVE 01/25/2020 1125   PROTEINUR NEGATIVE 01/25/2020 1125   UROBILINOGEN 0.2 05/16/2014 1231   NITRITE NEGATIVE 01/25/2020 1125   LEUKOCYTESUR NEGATIVE 01/25/2020 1125   Sepsis Labs Invalid input(s): PROCALCITONIN,  WBC,  LACTICIDVEN Microbiology Recent Results (from the past 240 hour(s))  Respiratory Panel by RT PCR (Flu A&B, Covid) - Nasopharyngeal Swab     Status: None   Collection Time: 01/25/20  8:18 PM   Specimen: Nasopharyngeal Swab  Result Value Ref Range Status   SARS Coronavirus 2 by RT PCR NEGATIVE NEGATIVE Final    Comment: (NOTE) SARS-CoV-2 target nucleic acids are NOT DETECTED.  The SARS-CoV-2 RNA is generally detectable in upper respiratoy specimens during the acute phase of infection. The lowest concentration of SARS-CoV-2 viral copies this assay can detect is 131 copies/mL. A negative result does not preclude SARS-Cov-2 infection and should not be used as the sole basis for treatment or other patient management decisions. A negative result may occur with   improper specimen collection/handling, submission of specimen other than nasopharyngeal swab, presence of viral mutation(s) within the areas targeted by this assay, and inadequate number of viral copies (<131 copies/mL). A negative result must be combined with clinical observations, patient history, and epidemiological information. The expected result is Negative.  Fact Sheet for Patients:  PinkCheek.be  Fact Sheet for Healthcare Providers:  GravelBags.it  This test is no t yet approved or cleared by the [place] FDA and  has been authorized for detection and/or diagnosis of SARS-CoV-2 by FDA under an Emergency Use Authorization (EUA). This EUA will remain  in effect (meaning this test can be used) for the duration of the COVID-19 declaration under Section 564(b)(1) of the Act, 21 U.S.C. section 360bbb-3(b)(1), unless the authorization is terminated or revoked sooner.     Influenza A by PCR NEGATIVE NEGATIVE Final   Influenza B by PCR NEGATIVE NEGATIVE Final    Comment: (NOTE) The Xpert Xpress SARS-CoV-2/FLU/RSV assay is intended as an aid in  the diagnosis of influenza from Nasopharyngeal swab specimens and  should not be used as a sole basis for treatment. Nasal washings and  aspirates are unacceptable for Xpert Xpress SARS-CoV-2/FLU/RSV  testing.  Fact Sheet for Patients: PinkCheek.be  Fact Sheet for Healthcare Providers: GravelBags.it  This test is not yet approved or cleared by the [place] FDA and  has been authorized for detection and/or diagnosis of SARS-CoV-2 by  FDA under an Emergency Use Authorization (EUA). This EUA will remain  in effect (meaning this test can be used) for the duration of the  Covid-19 declaration under Section 564(b)(1) of the Act, 21  U.S.C. section 360bbb-3(b)(1), unless the authorization is  terminated or  revoked. Performed at [facility], [place] [address], [place], [place] [address]     Please note: You were cared for by a hospitalist during your hospital stay. Once you are discharged, your primary care physician will handle any further medical issues. Please note that NO REFILLS for any discharge medications will be authorized once you are discharged, as it is imperative that you return to your primary care physician (or establish a relationship with a primary care  physician if you do not have one) for your post hospital discharge needs so that they can reassess your need for medications and monitor your lab values.    Time coordinating discharge: 40 minutes  SIGNED:   Shelly Coss, MD  [facility] 01/26/2020, 7:05 PM Pager [phone]  If 7PM-7AM, please contact night-coverage [email or web address] Password [email or web address]

## 2020-01-26 NOTE — Progress Notes (Signed)
Blood lab called to inform me the blood is incompatible and is [place] typically matched and Dr. Tawanna Solo will need to write an order for the patient to receive this blood. Dr. Tawanna Solo called and informed. He states she doesn't need the transfusion and she can be discharged. Dr. Tawanna Solo spoke to the patient over the phone. Discharge instructions explained to Mrs Faught and next due medications were written on her discharge sheet. Mrs. Madia son picking patient up and taking her home. Discharged via wheelchair.

## 2020-01-27 ENCOUNTER — Telehealth: Payer: Self-pay | Admitting: Oncology

## 2020-01-27 ENCOUNTER — Telehealth: Payer: Self-pay | Admitting: *Deleted

## 2020-01-27 LAB — PATHOLOGIST SMEAR REVIEW

## 2020-01-27 NOTE — Telephone Encounter (Signed)
Received a call from Ms. Tomei to schedule an appt for lymphocytosis and normocytic anemia. Pt was recently seen in the hospital. She has been scheduled to see Dr. Alen Blew on 11/18 at 11am. Pt aware to arrive 20 minutes early.

## 2020-01-27 NOTE — Telephone Encounter (Signed)
Called patient on 01/27/2020 , 9:30 AM in an attempt to reach the patient for a hospital follow up.   Admit date: 01/25/20 Discharge: 01/26/20   She does not have any questions or concerns about medications from the hospital admission. The patient's medications were reviewed over the phone, they were counseled to bring in all current medications to the hospital follow up visit.   I advised the patient to call if any questions or concerns arise about the hospital admission or medications. Patient has stopped Gabapentin and has continued all other medications.  Home health was not started in the hospital. Patient states she feels much stronger. All questions were answered and a follow up appointment was made. Appointment was made on 02/05/2020 with Garnet Sierras, NP.  Prior to Admission medications   Medication Sig Start Date End Date Taking? Authorizing Provider  amLODipine (NORVASC) 5 MG tablet Take 1 tablet (5 mg total) by mouth daily. 12/24/19   Garnet Sierras, NP  Ascorbic Acid (VITAMIN C) 1000 MG tablet Take 1,000 mg by mouth daily.    [provider]  calcium carbonate (OSCAL) 1500 (600 Ca) MG TABS tablet Take 500 mg by mouth daily.    [provider]  Cholecalciferol (VITAMIN D) 2000 UNITS tablet Take 4,000 Units by mouth daily.     [provider]  Ferrous Sulfate (IRON) 325 (65 Fe) MG TABS Take 325 mg by mouth daily.     [provider]  gabapentin (NEURONTIN) 100 MG capsule Take 1 capsule 3 x /day for Neuropathy Pain Patient not taking: Reported on 01/25/2020 08/02/19 01/25/20  Unk Pinto, MD

## 2020-01-28 LAB — TYPE AND SCREEN
ABO/RH(D): B POS
Antibody Screen: POSITIVE
DAT, IgG: POSITIVE
Unit division: 0

## 2020-01-28 LAB — BPAM RBC
Blood Product Expiration Date: 202111302359
Unit Type and Rh: 9500

## 2020-02-05 ENCOUNTER — Ambulatory Visit (INDEPENDENT_AMBULATORY_CARE_PROVIDER_SITE_OTHER): Payer: Medicare Other | Admitting: Adult Health Nurse Practitioner

## 2020-02-05 ENCOUNTER — Encounter: Payer: Self-pay | Admitting: Adult Health Nurse Practitioner

## 2020-02-05 ENCOUNTER — Other Ambulatory Visit: Payer: Self-pay

## 2020-02-05 VITALS — BP 110/80 | HR 76 | Temp 97.6°F | Ht 59.0 in | Wt 144.0 lb

## 2020-02-05 DIAGNOSIS — D72820 Lymphocytosis (symptomatic): Secondary | ICD-10-CM

## 2020-02-05 DIAGNOSIS — D649 Anemia, unspecified: Secondary | ICD-10-CM | POA: Diagnosis not present

## 2020-02-05 DIAGNOSIS — N183 Chronic kidney disease, stage 3 unspecified: Secondary | ICD-10-CM

## 2020-02-05 DIAGNOSIS — Z23 Encounter for immunization: Secondary | ICD-10-CM | POA: Diagnosis not present

## 2020-02-05 DIAGNOSIS — I10 Essential (primary) hypertension: Secondary | ICD-10-CM

## 2020-02-05 NOTE — Progress Notes (Signed)
Hospital follow up  Assessment and Plan: Hospital visit follow up for anemia and lyphocytocic  Jacqueline Hancock was seen today for follow-up and hospitalization follow-up.  Diagnoses and all orders for this visit:  Normocytic anemia -     CBC with Differential/Platelet -     COMPLETE METABOLIC PANEL WITH GFR  Lymphocytosis -     CBC with Differential/Platelet -     COMPLETE METABOLIC PANEL WITH GFR  Essential hypertension Continue current medications: amlodipine 5mg  Monitor blood pressure at home; call if consistently over 130/80 Continue DASH diet.   Reminder to go to the ER if any CP, SOB, nausea, dizziness, severe HA, changes vision/speech, left arm numbness and tingling and jaw pain.  Stage 3 chronic kidney disease, unspecified whether stage 3a or 3b CKD (HCC) Increase fluids  Avoid NSAIDS Blood pressure control Monitor sugars  Will continue to monitor  Need for influenza vaccination -     Flu vaccine HIGH DOSE PF Received today   All medications were reviewed with patient and family and fully reconciled. All questions answered fully, and patient and family members were encouraged to call the office with any further questions or concerns. Discussed goal to avoid readmission related to this diagnosis.  There are no discontinued medications.  CAN NOT DO FOR BCBS REGULAR OR MEDICARE Over 30 minutes of face to face exam, counseling, chart review, and complex, high/moderate level critical decision making was performed this visit.   Future Appointments  Date Time Provider [place]  02/20/2020 11:00 AM Wyatt Portela, MD [facility] None  06/24/2020 11:00 AM Garnet Sierras, NP [facility] None  12/28/2020 10:00 AM Garnet Sierras, NP [facility] None     HPI 79 y.o.female presents for follow up for transition from recent hospitalization or SNIF stay. Admit date to the hospital was 01/25/20, patient was discharged from the hospital on 01/26/20 and our clinical staff  contacted the office the day after discharge to set up a follow up appointment. The discharge summary, medications, and diagnostic test results were reviewed before meeting with the patient. The patient was admitted for: amenia and lymphocytosis.  From hospital admission: Jacqueline Hancock is a 79 y.o. female with medical history significant of hypertension, chronic kidney disease --presents the emergency department for evaluation of low blood counts at the urging of her primary provider. It appears that the patient had a routine evaluation on 12/24/2019. At that time it was noted that her hemoglobin was 10.3 and her white blood cell count was 18.3 with lymphocytosis (10.3) and monocytosis (5.4). Patient had labs rechecked on 9/27. Hemoglobin was then 9.8 with white blood cell count of 18.6. She then had Hemoccult testing ordered which was negative x3. On 10/20 hemoglobin was then 7.7 with a white blood cell count of 26.3, 13.5 lymphocytes and 9.5 monocytes. Patient was called and encouraged to come to the emergency department. She is asymptomatic except for fatigue over the past week. She states that she lives at home with her husband who has dementia and she is the primary caregiver. She denies bloody or black stools, easy bruising or bleeding, blood in her urine. No fevers, chills or other infectious symptoms. No chest pain, shortness of breath. She has had chronic abdominal pain without acute worsening, fatty liver diagnosed about a year ago. Type and cross match ordered but the patient has unusual antibodies and there will be a delay in getting blood. [facility] called to admit the patient for further evaluation of anemia and lymphocytosis.  She did  not receive PRBC related to availability of blood products.  She is B+ , warm autoantibody. Remained stable through hospital admission and hemoglobin 7.9 at discharge.  Dr Alen Blew, oncology, consulted tfor follow up on lymphocytosis, CLL?  She has follow up with  Dr Lynelle Doctor on 02/20/20.  Today she reports she feels about the same, she is fatigued but attributed this to caregiver strain.  She cares for her husband in the home who has dementia.  She said his behaviors lately have been childlike.  She reports she tries to keep him busy but it is a full time job.  She reports her only breaks are [employer] office visits out of the home.   Home health is not involved.   Images while in the hospital: No results found.  She is is doing well with her water intake and drinking around 40-64 ounces of water a day.  She is following with nut[facility]-system for her meals. She is down 11lbs since last OV. She does not weight herself at home. Recommend doing this daily to monitor for drastic changes. Related to recent diet change concerning if continued rate of weight loss?      Current Outpatient Medications (Cardiovascular):  .  amLODipine (NORVASC) 5 MG tablet, Take 1 tablet (5 mg total) by mouth daily.    Current Outpatient Medications (Hematological):  Marland Kitchen  Ferrous Sulfate (IRON) 325 (65 Fe) MG TABS, Take 325 mg by mouth daily.   Current Outpatient Medications (Other):  Marland Kitchen  Ascorbic Acid (VITAMIN C) 1000 MG tablet, Take 1,000 mg by mouth daily. .  calcium carbonate (OSCAL) 1500 (600 Ca) MG TABS tablet, Take 500 mg by mouth daily. .  Cholecalciferol (VITAMIN D) 2000 UNITS tablet, Take 4,000 Units by mouth daily.   Past Medical History:  Diagnosis Date  . Early cataracts, bilateral   . Elevated hemoglobin A1c   . Fibroids   . GERD (gastroesophageal reflux disease)   . Hyperlipidemia   . Hypertension   . Osteopenia      Allergies  Allergen Reactions  . Nexium [Esomeprazole Magnesium] Other (See Comments)    Headache    ROS: all negative except above.   Physical Exam: Filed Weights   02/05/20 1115  Weight: 144 lb (65.3 kg)   BP 110/80   Pulse 76   Temp 97.6 F (36.4 C)   Ht 4\' 11"  (1.499 m)   Wt 144 lb (65.3 kg)   SpO2 100%   BMI 29.08 kg/m   General Appearance: Well nourished, in no apparent distress. Eyes: PERRLA, EOMs, conjunctiva no swelling or erythema Sinuses: No Frontal/maxillary tenderness ENT/Mouth: Ext aud canals clear, TMs without erythema, bulging. No erythema, swelling, or exudate on post pharynx.  Tonsils not swollen or erythematous. Hearing normal.  Neck: Supple, thyroid normal.  Respiratory: Respiratory effort normal, BS equal bilaterally without rales, rhonchi, wheezing or stridor.  Cardio: RRR with no MRGs. Brisk peripheral pulses without edema.  Abdomen: Soft, + BS.  Non tender, no guarding, rebound, hernias, masses. Lymphatics: Non tender without lymphadenopathy.  Musculoskeletal: Full ROM, 5/5 strength, normal gait.  Skin: Warm, dry without rashes, lesions, ecchymosis.  Neuro: Cranial nerves intact. Normal muscle tone, no cerebellar symptoms. Sensation intact.  Psych: Awake and oriented X 3, normal affect, Insight and Judgment appropriate.     Garnet Sierras, NP 12:07 PM [facility]

## 2020-02-05 NOTE — Patient Instructions (Addendum)
  You received your Influenza HD vaccination today.  Your Blood type is B Positive  We will call you with lab results in 1-3 days.  Keep up the good work with your water intake    Please seek immediate attention at an emergency room for any shortness of breath, weakness, or chest pains.

## 2020-02-06 LAB — CBC WITH DIFFERENTIAL/PLATELET
Absolute Monocytes: 10000 cells/uL — ABNORMAL HIGH (ref 200–950)
Basophils Absolute: 54 cells/uL (ref 0–200)
Basophils Relative: 0.2 %
Eosinophils Absolute: 81 cells/uL (ref 15–500)
Eosinophils Relative: 0.3 %
HCT: 21 % — ABNORMAL LOW (ref 35.0–45.0)
Hemoglobin: 7.1 g/dL — ABNORMAL LOW (ref 11.7–15.5)
Lymphs Abs: 14092 cells/uL — ABNORMAL HIGH (ref 850–3900)
MCH: 32.4 pg (ref 27.0–33.0)
MCHC: 33.8 g/dL (ref 32.0–36.0)
MCV: 95.9 fL (ref 80.0–100.0)
MPV: 11.1 fL (ref 7.5–12.5)
Monocytes Relative: 36.9 %
Neutro Abs: 2873 cells/uL (ref 1500–7800)
Neutrophils Relative %: 10.6 %
Platelets: 162 10*3/uL (ref 140–400)
RBC: 2.19 10*6/uL — ABNORMAL LOW (ref 3.80–5.10)
RDW: 16.7 % — ABNORMAL HIGH (ref 11.0–15.0)
Total Lymphocyte: 52 %
WBC: 27.1 10*3/uL — ABNORMAL HIGH (ref 3.8–10.8)

## 2020-02-06 LAB — COMPLETE METABOLIC PANEL WITH GFR
AG Ratio: 2 (calc) (ref 1.0–2.5)
ALT: 12 U/L (ref 6–29)
AST: 20 U/L (ref 10–35)
Albumin: 4.3 g/dL (ref 3.6–5.1)
Alkaline phosphatase (APISO): 83 U/L (ref 37–153)
BUN/Creatinine Ratio: 16 (calc) (ref 6–22)
BUN: 18 mg/dL (ref 7–25)
CO2: 25 mmol/L (ref 20–32)
Calcium: 9.6 mg/dL (ref 8.6–10.4)
Chloride: 103 mmol/L (ref 98–110)
Creat: 1.15 mg/dL — ABNORMAL HIGH (ref 0.60–0.93)
GFR, Est African American: 52 mL/min/{1.73_m2} — ABNORMAL LOW (ref >=60)
GFR, Est Non African American: 45 mL/min/{1.73_m2} — ABNORMAL LOW (ref >=60)
Globulin: 2.1 g/dL (calc) (ref 1.9–3.7)
Glucose, Bld: 92 mg/dL (ref 65–99)
Potassium: 4.6 mmol/L (ref 3.5–5.3)
Sodium: 139 mmol/L (ref 135–146)
Total Bilirubin: 2.9 mg/dL — ABNORMAL HIGH (ref 0.2–1.2)
Total Protein: 6.4 g/dL (ref 6.1–8.1)

## 2020-02-18 DIAGNOSIS — Z1231 Encounter for screening mammogram for malignant neoplasm of breast: Secondary | ICD-10-CM | POA: Diagnosis not present

## 2020-02-18 DIAGNOSIS — Z1239 Encounter for other screening for malignant neoplasm of breast: Secondary | ICD-10-CM | POA: Diagnosis not present

## 2020-02-20 ENCOUNTER — Inpatient Hospital Stay: Payer: Medicare Other | Attending: Oncology | Admitting: Oncology

## 2020-02-20 ENCOUNTER — Other Ambulatory Visit: Payer: Self-pay

## 2020-02-20 VITALS — BP 134/56 | HR 89 | Temp 97.4°F | Resp 18 | Ht 59.0 in | Wt 146.4 lb

## 2020-02-20 DIAGNOSIS — R590 Localized enlarged lymph nodes: Secondary | ICD-10-CM

## 2020-02-20 DIAGNOSIS — D649 Anemia, unspecified: Secondary | ICD-10-CM | POA: Diagnosis not present

## 2020-02-20 DIAGNOSIS — D72829 Elevated white blood cell count, unspecified: Secondary | ICD-10-CM | POA: Diagnosis not present

## 2020-02-20 NOTE — Progress Notes (Signed)
Reason for the request:    Anemia and lymphocytosis  HPI: I was asked by Dr. Melford Aase to evaluate Jacqueline Hancock for evaluation of anemia and lymphocytosis.  She is a 79 year old woman with history of hypertension, hyperlipidemia and osteopenia.  She was found to have worsening anemia detected in September 2021.  Her hemoglobin was 10.3 and was stat found to have lymphocytosis with white cell count of 18,000 and lymphocyte percentage of 56%.  Repeat CBC on September 27 showed a hemoglobin of 9.8 and white cell count of 18.6.  She was evaluated in the emergency department on October 23 of 2021 and found to have hemoglobin of 7.1 and repeat CBC showed a hemoglobin of 7.9.  CBC obtained on January 05, 2020 showed a white cell count of 27,000 with hemoglobin of 7.1 and a platelet count of 162.  Flow cytometry on peripheral blood ordered by her primary care physician showed evidence of lymphocytosis and possible lymphoproliferative disorder.  She was hospitalized overnight although did not receive packed red cell transfusion because of antibodies in her blood typing.  Clinically, she reports generally well without any major complaints.  She does report some mild fatigue and tiredness but no shortness of breath or dyspnea exertion.  She denies any lymphadenopathy or petechiae.  She denies any constitutional symptoms.  She continues to be active including driving and attends to her ill husband who has multiple health condition and she is the primary provider for him.   She does not report any headaches, blurry vision, syncope or seizures. Does not report any fevers, chills or sweats.  Does not report any cough, wheezing or hemoptysis.  Does not report any chest pain, palpitation, orthopnea or leg edema.  Does not report any nausea, vomiting or abdominal pain.  Does not report any constipation or diarrhea.  Does not report any skeletal complaints.    Does not report frequency, urgency or hematuria.  Does not report any  skin rashes or lesions. Does not report any heat or cold intolerance.  Does not report any lymphadenopathy or petechiae.  Does not report any anxiety or depression.  Remaining review of systems is negative.    Past Medical History:  Diagnosis Date  . Early cataracts, bilateral   . Elevated hemoglobin A1c   . Fibroids   . GERD (gastroesophageal reflux disease)   . Hyperlipidemia   . Hypertension   . Osteopenia   :  Past Surgical History:  Procedure Laterality Date  . ABDOMINAL HYSTERECTOMY    . APPENDECTOMY    . CATARACT EXTRACTION, BILATERAL Bilateral 09/2017   Dr. Richardean Sale  . 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 TONSILLECTOMY AND ADENOIDECTOMY    :   Current Outpatient Medications:  .  amLODipine (NORVASC) 5 MG tablet, Take 1 tablet (5 mg total) by mouth daily., Disp: 90 tablet, Rfl: 3 .  Ascorbic Acid (VITAMIN C) 1000 MG tablet, Take 1,000 mg by mouth daily., Disp: , Rfl:  .  calcium carbonate (OSCAL) 1500 (600 Ca) MG TABS tablet, Take 500 mg by mouth daily., Disp: , Rfl:  .  Cholecalciferol (VITAMIN D) 2000 UNITS tablet, Take 4,000 Units by mouth daily. , Disp: , Rfl:  .  Ferrous Sulfate (IRON) 325 (65 Fe) MG TABS, Take 325 mg by mouth daily. , Disp: , Rfl: :  Allergies  Allergen Reactions  . Nexium [Esomeprazole Magnesium] Other (See Comments)    Headache  :  Family History  Problem Relation Age of  Onset  . Leukemia Mother   . Hypertension Father   . Diabetes Sister   . Heart disease Brother   . Diabetes Brother   . Diabetes Brother   :  Social History   Socioeconomic History  . Marital status: Married    Spouse name: Not on file  . Number of children: Not on file  . Years of education: Not on file  . Highest education level: Not on file  Occupational History  . Not on file  Tobacco Use  . Smoking status: Never Smoker  . Smokeless tobacco: Never Used  Substance and Sexual Activity  . Alcohol use: No  . Drug use: Not on file  . Sexual  activity: Not on file  Other Topics Concern  . Not on file  Social History Narrative  . Not on file   Social Determinants of Health   Financial Resource Strain:   . Difficulty of Paying Living Expenses: Not on file  Food Insecurity:   . Worried About Charity fundraiser in the Last Year: Not on file  . Ran Out of Food in the Last Year: Not on file  Transportation Needs:   . Lack of Transportation (Medical): Not on file  . Lack of Transportation (Non-Medical): Not on file  Physical Activity:   . Days of Exercise per Week: Not on file  . Minutes of Exercise per Session: Not on file  Stress:   . Feeling of Stress : Not on file  Social Connections:   . Frequency of Communication with Friends and Family: Not on file  . Frequency of Social Gatherings with Friends and Family: Not on file  . Attends Religious Services: Not on file  . Active Member of Clubs or Organizations: Not on file  . Attends Archivist Meetings: Not on file  . Marital Status: Not on file  Intimate Partner Violence:   . Fear of Current or Ex-Partner: Not on file  . Emotionally Abused: Not on file  . Physically Abused: Not on file  . Sexually Abused: Not on file  :  Pertinent items are noted in HPI.  Exam: Blood pressure (!) 134/56, pulse 89, temperature (!) 97.4 F (36.3 C), temperature source Tympanic, resp. rate 18, height 4' 11"  (1.499 m), weight 146 lb 6.4 oz (66.4 kg), SpO2 100 %.  ECOG 1  General appearance: alert and cooperative appeared without distress. Head: atraumatic without any abnormalities. Eyes: conjunctivae/corneas clear. PERRL.  Sclera anicteric. Throat: lips, mucosa, and tongue normal; without oral thrush or ulcers. Resp: clear to auscultation bilaterally without rhonchi, wheezes or dullness to percussion. Cardio: regular rate and rhythm, S1, S2 normal, no murmur, click, rub or gallop GI: soft, non-tender; bowel sounds normal; no masses,  no organomegaly Skin: Skin color,  texture, turgor normal. No rashes or lesions Lymph nodes: Cervical, supraclavicular, and axillary nodes normal. Neurologic: Grossly normal without any motor, sensory or deep tendon reflexes. Musculoskeletal: No joint deformity or effusion.  CBC    Component Value Date/Time   WBC 27.1 (H) 02/05/2020 1203   RBC 2.19 (L) 02/05/2020 1203   HGB 7.1 (L) 02/05/2020 1203   HCT 21.0 (L) 02/05/2020 1203   PLT 162 02/05/2020 1203   MCV 95.9 02/05/2020 1203   MCH 32.4 02/05/2020 1203   MCHC 33.8 02/05/2020 1203   RDW 16.7 (H) 02/05/2020 1203   LYMPHSABS 14,092 (H) 02/05/2020 1203   MONOABS 7.7 (H) 01/26/2020 0841   EOSABS 81 02/05/2020 1203   BASOSABS 54  02/05/2020 1203     Assessment and Plan:    79 year old woman with:  1.  Lymphocytosis and anemia noted in September 2021.  White cell count have increased from 18,000 to 27,000.  Her differential showed increased lymphocytosis with peripheral smear showed absolute lymphocytosis.   The differential diagnosis was reviewed today with the patient and her son that accompanied her today.  Lymphoproliferative disorder is the most likely etiology with conditions such as CLL, mantle cell lymphoma among other conditions are a consideration.  She does not have any or constitutional symptoms or clear splenomegaly on exam but to complete her work-up she will require full staging.  I recommended proceeding with a bone marrow biopsy as well as CT scan chest abdomen and pelvis to assess for any lymphadenopathy or splenomegaly.  After obtaining these results we will discuss treatment options at that time.  Treatments in general would include systemic chemotherapy, immunotherapy or oral targeted therapy depending on the etiology of these findings.  Risks and benefits of bone marrow biopsy procedure were discussed.  Complications include pain, bleeding, infection among others were reviewed.  After discussion she is agreeable to proceed and we will arrange for  that in the near future.  2.  Anemia: Her hemoglobin is above 7 and appears to be adequate at this time.  We will defer transfusion unless she becomes symptomatic.   3.  Follow-up: Will be in the next few weeks after completing her work-up.  60  minutes were dedicated to this visit. The time was spent on reviewing laboratory data, discussing treatment options, discussing differential diagnosis and answering questions regarding future plan.     A copy of this consult has been forwarded to the requesting physician.

## 2020-03-03 ENCOUNTER — Other Ambulatory Visit: Payer: Self-pay | Admitting: Radiology

## 2020-03-05 ENCOUNTER — Ambulatory Visit (HOSPITAL_COMMUNITY)
Admission: RE | Admit: 2020-03-05 | Discharge: 2020-03-05 | Disposition: A | Discharge status: Home / Self Care | Payer: Medicare Other | Source: Ambulatory Visit | Attending: Oncology | Admitting: Oncology

## 2020-03-05 ENCOUNTER — Other Ambulatory Visit: Payer: Self-pay | Admitting: *Deleted

## 2020-03-05 ENCOUNTER — Other Ambulatory Visit: Payer: Self-pay

## 2020-03-05 ENCOUNTER — Ambulatory Visit (HOSPITAL_COMMUNITY): Payer: Medicare Other

## 2020-03-05 ENCOUNTER — Encounter (HOSPITAL_COMMUNITY): Payer: Self-pay

## 2020-03-05 ENCOUNTER — Ambulatory Visit: Payer: Medicare Other

## 2020-03-05 DIAGNOSIS — D479 Neoplasm of uncertain behavior of lymphoid, hematopoietic and related tissue, unspecified: Secondary | ICD-10-CM | POA: Diagnosis not present

## 2020-03-05 DIAGNOSIS — E785 Hyperlipidemia, unspecified: Secondary | ICD-10-CM | POA: Diagnosis not present

## 2020-03-05 DIAGNOSIS — D72820 Lymphocytosis (symptomatic): Secondary | ICD-10-CM | POA: Diagnosis not present

## 2020-03-05 DIAGNOSIS — I10 Essential (primary) hypertension: Secondary | ICD-10-CM | POA: Diagnosis not present

## 2020-03-05 DIAGNOSIS — D539 Nutritional anemia, unspecified: Secondary | ICD-10-CM | POA: Insufficient documentation

## 2020-03-05 DIAGNOSIS — Z79899 Other long term (current) drug therapy: Secondary | ICD-10-CM | POA: Diagnosis not present

## 2020-03-05 DIAGNOSIS — D72829 Elevated white blood cell count, unspecified: Secondary | ICD-10-CM

## 2020-03-05 DIAGNOSIS — R590 Localized enlarged lymph nodes: Secondary | ICD-10-CM | POA: Diagnosis not present

## 2020-03-05 DIAGNOSIS — D649 Anemia, unspecified: Secondary | ICD-10-CM

## 2020-03-05 DIAGNOSIS — Z7901 Long term (current) use of anticoagulants: Secondary | ICD-10-CM | POA: Insufficient documentation

## 2020-03-05 LAB — CBC WITH DIFFERENTIAL/PLATELET
Abs Immature Granulocytes: 0 10*3/uL (ref 0.00–0.07)
Basophils Absolute: 0.3 10*3/uL — ABNORMAL HIGH (ref 0.0–0.1)
Basophils Relative: 1 %
Eosinophils Absolute: 0.3 10*3/uL (ref 0.0–0.5)
Eosinophils Relative: 1 %
HCT: 18.7 % — ABNORMAL LOW (ref 36.0–46.0)
Hemoglobin: 5.9 g/dL — CL (ref 12.0–15.0)
Lymphocytes Relative: 71 %
Lymphs Abs: 18.5 10*3/uL — ABNORMAL HIGH (ref 0.7–4.0)
MCH: 34.1 pg — ABNORMAL HIGH (ref 26.0–34.0)
MCHC: 31.6 g/dL (ref 30.0–36.0)
MCV: 108.1 fL — ABNORMAL HIGH (ref 80.0–100.0)
Monocytes Absolute: 2.6 10*3/uL — ABNORMAL HIGH (ref 0.1–1.0)
Monocytes Relative: 10 %
Neutro Abs: 4.4 10*3/uL (ref 1.7–7.7)
Neutrophils Relative %: 17 %
Platelets: 154 10*3/uL (ref 150–400)
RBC: 1.73 MIL/uL — ABNORMAL LOW (ref 3.87–5.11)
RDW: 21.8 % — ABNORMAL HIGH (ref 11.5–15.5)
WBC: 26 10*3/uL — ABNORMAL HIGH (ref 4.0–10.5)
nRBC: 0.1 % (ref 0.0–0.2)

## 2020-03-05 LAB — PROTIME-INR
INR: 1.2 (ref 0.8–1.2)
Prothrombin Time: 14.3 seconds (ref 11.4–15.2)

## 2020-03-05 MED ORDER — FENTANYL CITRATE (PF) 100 MCG/2ML IJ SOLN
INTRAMUSCULAR | Status: AC | PRN
Start: 2020-03-05 — End: 2020-03-05
  Administered 2020-03-05: 50 ug via INTRAVENOUS

## 2020-03-05 MED ORDER — MIDAZOLAM HCL 2 MG/2ML IJ SOLN
INTRAMUSCULAR | Status: AC | PRN
  Administered 2020-03-05 (×2): 1 mg via INTRAVENOUS

## 2020-03-05 MED ORDER — SODIUM CHLORIDE 0.9 % IV SOLN: INTRAVENOUS | Status: DC

## 2020-03-05 MED ORDER — FENTANYL CITRATE (PF) 100 MCG/2ML IJ SOLN
INTRAMUSCULAR | Status: AC
  Filled 2020-03-05: qty 2

## 2020-03-05 MED ORDER — MIDAZOLAM HCL 2 MG/2ML IJ SOLN
INTRAMUSCULAR | Status: AC
  Filled 2020-03-05: qty 2

## 2020-03-05 MED ORDER — LIDOCAINE HCL (PF) 1 % IJ SOLN
INTRAMUSCULAR | Status: AC | PRN
  Administered 2020-03-05: 10 mL via INTRADERMAL

## 2020-03-05 NOTE — Discharge Instructions (Signed)
Urgent needs - IR on call MD [phone]  Wound - May remove dressing and shower in 24h.  Keep site clean and dry.  Replace with bandaid. Do not submerge in tub or water until site healing well.   Bone Marrow Aspiration and Bone Marrow Biopsy, Adult, Care After This sheet gives you information about how to care for yourself after your procedure. Your health care provider may also give you more specific instructions. If you have problems or questions, contact your health care provider. What can I expect after the procedure? After the procedure, it is common to have:  Mild pain and tenderness.  Swelling.  Bruising. Follow these instructions at home: Puncture site care  Follow instructions from your health care provider about how to take care of the puncture site. Make sure you: ? Wash your hands with soap and water before and after you change your bandage (dressing). If soap and water are not available, use hand sanitizer. ? Change your dressing as told by your health care provider.  Check your puncture site every day for signs of infection. Check for: ? More redness, swelling, or pain. ? Fluid or blood. ? Warmth. ? Pus or a bad smell. Activity  Return to your normal activities as told by your health care provider. Ask your health care provider what activities are safe for you.  Do not lift anything that is heavier than 10 lb (4.5 kg), or the limit that you are told, until your health care provider says that it is safe.  Do not drive for 24 hours if you were given a sedative during your procedure. General instructions  Take over-the-counter and prescription medicines only as told by your health care provider.  Do not take baths, swim, or use a hot tub until your health care provider approves. Ask your health care provider if you may take showers. You may only be allowed to take sponge baths.  If directed, put ice on the affected area. To do this: ? Put ice in a plastic  bag. ? Place a towel between your skin and the bag. ? Leave the ice on for 20 minutes, 2-3 times a day.  Keep all follow-up visits as told by your health care provider. This is important. Contact a health care provider if:  Your pain is not controlled with medicine.  You have a fever.  You have more redness, swelling, or pain around the puncture site.  You have fluid or blood coming from the puncture site.  Your puncture site feels warm to the touch.  You have pus or a bad smell coming from the puncture site. Summary  After the procedure, it is common to have mild pain, tenderness, swelling, and bruising.  Follow instructions from your health care provider about how to take care of the puncture site and what activities are safe for you.  Take over-the-counter and prescription medicines only as told by your health care provider.  Contact a health care provider if you have any signs of infection, such as fluid or blood coming from the puncture site. This information is not intended to replace advice given to you by your health care provider. Make sure you discuss any questions you have with your health care provider. Document Revised: 08/07/2018 Document Reviewed: 08/07/2018 Elsevier Patient Education  [place].  Moderate Conscious Sedation, Adult, Care After These instructions provide you with information about caring for yourself after your procedure. Your health care provider may also give you more specific  instructions. Your treatment has been planned according to current medical practices, but problems sometimes occur. Call your health care provider if you have any problems or questions after your procedure. What can I expect after the procedure? After your procedure, it is common:  To feel sleepy for several hours.  To feel clumsy and have poor balance for several hours.  To have poor judgment for several hours.  To vomit if you eat too soon. Follow these  instructions at home: For at least 24 hours after the procedure:  Do not: ? Participate in activities where you could fall or become injured. ? Drive. ? Use heavy machinery. ? Drink alcohol. ? Take sleeping pills or medicines that cause drowsiness. ? Make important decisions or sign legal documents. ? Take care of children on your own.  Rest. Eating and drinking  Follow the diet recommended by your health care provider.  If you vomit: ? Drink water, juice, or soup when you can drink without vomiting. ? Make sure you have little or no nausea before eating solid foods. General instructions  Have a responsible adult stay with you until you are awake and alert.  Take over-the-counter and prescription medicines only as told by your health care provider.  If you smoke, do not smoke without supervision.  Keep all follow-up visits as told by your health care provider. This is important. Contact a health care provider if:  You keep feeling nauseous or you keep vomiting.  You feel light-headed.  You develop a rash.  You have a fever. Get help right away if:  You have trouble breathing. This information is not intended to replace advice given to you by your health care provider. Make sure you discuss any questions you have with your health care provider. Document Revised: 03/03/2017 Document Reviewed: 07/11/2015 Elsevier Patient Education  2020 [employer].

## 2020-03-05 NOTE — Progress Notes (Signed)
Pt will be rescheduled for transfusion. Blood will bot be ready today due to the antibody pt has, per blood bank. Awaiting callback from [facility] to fit pt in for transfusion tomorrow. Called son to make aware of events. Son verbalized understanding.

## 2020-03-05 NOTE — Procedures (Signed)
Interventional Radiology Procedure Note  Procedure: CT guided aspirate and core biopsy of right iliac bone Complications: None Recommendations: - Bedrest supine x 1 hrs - Hydrocodone PRN  Pain - Follow biopsy results  Signed,  Dangela How K. Nishat Livingston, MD   

## 2020-03-05 NOTE — Consult Note (Addendum)
Chief Complaint: Patient was seen in consultation today for CT guided bone marrow biopsy  Referring Physician(s): Wyatt Portela  Supervising Physician: Jacqulynn Cadet  Patient Status: [facility] - Out-pt  History of Present Illness: Jacqueline Hancock is a 79 y.o. 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  She presents today for CT-guided bone marrow biopsy for further evaluation. Past Medical History:  Diagnosis Date  . Early cataracts, bilateral   . Elevated hemoglobin A1c   . Fibroids   . GERD (gastroesophageal reflux disease)   . Hyperlipidemia   . Hypertension   . Osteopenia     Past Surgical History:  Procedure Laterality Date  . ABDOMINAL HYSTERECTOMY    . APPENDECTOMY    . CATARACT EXTRACTION, BILATERAL Bilateral 09/2017   Dr. Richardean Sale  . 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 TONSILLECTOMY AND ADENOIDECTOMY      Allergies: Nexium [esomeprazole magnesium]  Medications: Prior to Admission medications   Medication Sig Start Date End Date Taking? Authorizing Provider  amLODipine (NORVASC) 5 MG tablet Take 1 tablet (5 mg total) by mouth daily. 12/24/19   Garnet Sierras, NP  Ascorbic Acid (VITAMIN C) 1000 MG tablet Take 1,000 mg by mouth daily.    [provider]  calcium carbonate (OSCAL) 1500 (600 Ca) MG TABS tablet Take 500 mg by mouth daily.    [provider]  Cholecalciferol (VITAMIN D) 2000 UNITS tablet Take 4,000 Units by mouth daily.     [provider]  Ferrous Sulfate (IRON) 325 (65 Fe) MG TABS Take 325 mg by mouth daily.     [provider]  gabapentin (NEURONTIN) 100 MG capsule Take 1 capsule 3 x /day for Neuropathy Pain Patient not taking: Reported on 01/25/2020 08/02/19 01/25/20  Unk Pinto, MD     Family History  Problem Relation Age of Onset  . Leukemia  Mother   . Hypertension Father   . Diabetes Sister   . Heart disease Brother   . Diabetes Brother   . Diabetes Brother     Social History   Socioeconomic History  . Marital status: Married    Spouse name: Not on file  . Number of children: Not on file  . Years of education: Not on file  . Highest education level: Not on file  Occupational History  . Not on file  Tobacco Use  . Smoking status: Never Smoker  . Smokeless tobacco: Never Used  Substance and Sexual Activity  . Alcohol use: No  . Drug use: Not on file  . Sexual activity: Not on file  Other Topics Concern  . Not on file  Social History Narrative  . Not on file   Social Determinants of Health   Financial Resource Strain:   . Difficulty of Paying Living Expenses: Not on file  Food Insecurity:   . Worried About Charity fundraiser in the Last Year: Not on file  . Ran Out of Food in the Last Year: Not on file  Transportation Needs:   . Lack of Transportation (Medical): Not on file  . Lack of Transportation (Non-Medical): Not on file  Physical Activity:   . Days of Exercise per Week: Not on file  . Minutes of Exercise per Session: Not on file  Stress:   . Feeling of Stress : Not on file  Social Connections:   . Frequency of  Communication with Friends and Family: Not on file  . Frequency of Social Gatherings with Friends and Family: Not on file  . Attends Religious Services: Not on file  . Active Member of Clubs or Organizations: Not on file  . Attends Archivist Meetings: Not on file  . Marital Status: Not on file     Review of Systems denies fever, headache, chest pain, cough, back pain, nausea, vomiting or bleeding; she does have some dyspnea with exertion and some mild left upper quadrant discomfort  Vital Signs: Vitals:   03/05/20 0947  BP: 128/63  Pulse: 75  Resp: 16  Temp: 98.5 F (36.9 C)  SpO2: 100%      Physical Exam awake, alert.  Chest clear to auscultation bilateral.   Heart with regular rate and rhythm.  Abdomen soft, positive bowel sounds, mild left upper quadrant tenderness to palpation.  No lower extremity edema.  Imaging: No results found.  Labs:  CBC: Recent Labs    01/22/20 1121 01/25/20 1221 01/26/20 0841 02/05/20 1203  WBC 26.3* 24.0* 24.8* 27.1*  HGB 7.7* 7.1* 7.9* 7.1*  HCT 22.6* 22.0* 24.3* 21.0*  PLT 149 141* 168 162    COAGS: No results for input(s): INR, APTT in the last 8760 hours.  BMP: Recent Labs    05/21/19 1144 05/21/19 1144 12/24/19 1436 12/24/19 1436 01/22/20 1121 01/25/20 1221 01/26/20 0841 02/05/20 1203  NA 140   < > 142   < > 139 139 145 139  K 4.3   < > 4.7   < > 4.5 4.1 5.0 4.6  CL 104   < > 104   < > 102 103 106 103  CO2 27   < > 27   < > 25 26 26 25   GLUCOSE 102*   < > 90   < > 91 111* 105* 92  BUN 19   < > 21   < > 27* 21 21 18   CALCIUM 10.2   < > 10.1   < > 9.5 9.2 9.9 9.6  CREATININE 1.07*   < > 1.22*   < > 1.26* 1.17* 1.16* 1.15*  GFRNONAA 50*   < > 42*   < > 40* 47* 48* 45*  GFRAA 58*  --  49*  --  47*  --   --  52*   < > = values in this interval not displayed.    LIVER FUNCTION TESTS: Recent Labs    12/24/19 1436 12/24/19 1436 12/30/19 0948 01/22/20 1121 01/25/20 1221 02/05/20 1203  BILITOT 2.0*  --   --  3.1* 3.0* 2.9*  AST 20  --   --  19 19 20   ALT 16  --   --  13 15 12   ALKPHOS  --   --   --   --  86  --   PROT 6.9   < > 6.3 6.5 6.8 6.4  ALBUMIN  --   --   --   --  4.2  --    < > = values in this interval not displayed.    TUMOR MARKERS: No results for input(s): AFPTM, CEA, CA199, CHROMGRNA in the last 8760 hours.  Assessment and Plan: 79 y.o. 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  She presents today for CT-guided bone marrow biopsy for further evaluation.Risks and benefits of procedure was discussed with the patient  including, but not limited to bleeding, infection, damage to adjacent structures or low yield requiring additional tests.  All of the questions were answered and there is agreement to proceed.  Consent signed and in chart.  Pt's hgb today is 5.9; Dr. Alen Blew notified-can proceed with BM bx today; their staff will see pt after procedure for transfusion   Thank you for this interesting consult.  I greatly enjoyed meeting LATONDA LARRIVEE and look forward to participating in their care.  A copy of this report was sent to the requesting provider on this date.  Electronically Signed: D. Rowe Robert, PA-C 03/05/2020, 9:40 AM   I spent a total of 20 minutes    in face to face in clinical consultation, greater than 50% of which was counseling/coordinating care for CT-guided bone marrow biopsy

## 2020-03-06 ENCOUNTER — Telehealth: Payer: Self-pay | Admitting: *Deleted

## 2020-03-06 ENCOUNTER — Telehealth: Payer: Self-pay

## 2020-03-06 LAB — PREPARE RBC (CROSSMATCH)

## 2020-03-06 NOTE — Telephone Encounter (Signed)
Called & spoke with [facility] & informed of appt tomorrow here at [facility] for 2 units blood & instructed to be here @ 8 am.   Pt's son also called & same info given.

## 2020-03-06 NOTE — Telephone Encounter (Signed)
Received a call from [place] in the blood bank that this patient has only the least incompatible blood for transfusion. Dr. Alen Blew is aware and states that he is okay with transfusion. [place] is aware of this as well.

## 2020-03-07 ENCOUNTER — Other Ambulatory Visit: Payer: Self-pay

## 2020-03-07 ENCOUNTER — Inpatient Hospital Stay: Payer: Medicare Other | Attending: Oncology

## 2020-03-07 DIAGNOSIS — R0609 Other forms of dyspnea: Secondary | ICD-10-CM | POA: Insufficient documentation

## 2020-03-07 DIAGNOSIS — Z79899 Other long term (current) drug therapy: Secondary | ICD-10-CM | POA: Insufficient documentation

## 2020-03-07 DIAGNOSIS — R5383 Other fatigue: Secondary | ICD-10-CM | POA: Insufficient documentation

## 2020-03-07 DIAGNOSIS — D591 Autoimmune hemolytic anemia, unspecified: Secondary | ICD-10-CM | POA: Diagnosis not present

## 2020-03-07 DIAGNOSIS — D649 Anemia, unspecified: Secondary | ICD-10-CM

## 2020-03-07 DIAGNOSIS — C8310 Mantle cell lymphoma, unspecified site: Secondary | ICD-10-CM | POA: Insufficient documentation

## 2020-03-07 MED ORDER — ACETAMINOPHEN 325 MG PO TABS
650.0000 mg | ORAL_TABLET | Freq: Once | ORAL | Status: AC
  Administered 2020-03-07: 650 mg via ORAL

## 2020-03-07 MED ORDER — DIPHENHYDRAMINE HCL 25 MG PO CAPS
25.0000 mg | ORAL_CAPSULE | Freq: Once | ORAL | Status: AC
  Administered 2020-03-07: 25 mg via ORAL

## 2020-03-07 MED ORDER — SODIUM CHLORIDE 0.9% IV SOLUTION
250.0000 mL | Freq: Once | INTRAVENOUS | Status: AC
  Administered 2020-03-07: 250 mL via INTRAVENOUS
  Filled 2020-03-07: qty 250

## 2020-03-07 NOTE — Patient Instructions (Signed)

## 2020-03-08 LAB — BPAM RBC
Blood Product Expiration Date: 202112050038
Blood Product Expiration Date: 202112272359
ISSUE DATE / TIME: 202112041054
ISSUE DATE / TIME: 202112041216
Unit Type and Rh: 7300
Unit Type and Rh: 9500

## 2020-03-08 LAB — TYPE AND SCREEN
ABO/RH(D): B POS
Antibody Screen: POSITIVE
DAT, IgG: POSITIVE
Unit division: 0
Unit division: 0

## 2020-03-09 ENCOUNTER — Other Ambulatory Visit: Payer: Self-pay

## 2020-03-09 ENCOUNTER — Ambulatory Visit (HOSPITAL_COMMUNITY): Payer: Medicare Other

## 2020-03-09 ENCOUNTER — Ambulatory Visit (HOSPITAL_COMMUNITY)
Admission: RE | Admit: 2020-03-09 | Discharge: 2020-03-09 | Disposition: A | Discharge status: Home / Self Care | Payer: Medicare Other | Source: Ambulatory Visit | Attending: Oncology | Admitting: Oncology

## 2020-03-09 DIAGNOSIS — R161 Splenomegaly, not elsewhere classified: Secondary | ICD-10-CM | POA: Diagnosis not present

## 2020-03-09 DIAGNOSIS — I700 Atherosclerosis of aorta: Secondary | ICD-10-CM | POA: Diagnosis not present

## 2020-03-09 DIAGNOSIS — R590 Localized enlarged lymph nodes: Secondary | ICD-10-CM

## 2020-03-09 DIAGNOSIS — J8410 Pulmonary fibrosis, unspecified: Secondary | ICD-10-CM | POA: Diagnosis not present

## 2020-03-09 DIAGNOSIS — N200 Calculus of kidney: Secondary | ICD-10-CM | POA: Diagnosis not present

## 2020-03-09 DIAGNOSIS — D72820 Lymphocytosis (symptomatic): Secondary | ICD-10-CM | POA: Diagnosis not present

## 2020-03-09 LAB — SURGICAL PATHOLOGY

## 2020-03-09 LAB — POCT I-STAT CREATININE: Creatinine, Ser: 1.1 mg/dL — ABNORMAL HIGH (ref 0.44–1.00)

## 2020-03-09 MED ORDER — IOHEXOL 300 MG/ML  SOLN
100.0000 mL | Freq: Once | INTRAMUSCULAR | Status: AC | PRN
  Administered 2020-03-09: 100 mL via INTRAVENOUS

## 2020-03-11 ENCOUNTER — Inpatient Hospital Stay: Payer: Medicare Other | Admitting: Oncology

## 2020-03-12 ENCOUNTER — Inpatient Hospital Stay (HOSPITAL_BASED_OUTPATIENT_CLINIC_OR_DEPARTMENT_OTHER): Payer: Medicare Other | Admitting: Oncology

## 2020-03-12 DIAGNOSIS — C8310 Mantle cell lymphoma, unspecified site: Secondary | ICD-10-CM | POA: Diagnosis not present

## 2020-03-12 DIAGNOSIS — D649 Anemia, unspecified: Secondary | ICD-10-CM

## 2020-03-12 NOTE — Progress Notes (Signed)
Hematology and Oncology Follow Up for Telemedicine Visits  ARUNA NESTLER [id number] 11-12-1940 79 y.o. 03/12/2020 12:52 PM Unk Pinto, MDMcKeown, Gwyndolyn Saxon, MD   I connected [phone] on 03/12/20 at  3:00 PM EST by telephone visit and verified that I am speaking with the correct person using two identifiers.   I discussed the limitations, risks, security and privacy concerns of performing an evaluation and management service by telemedicine and the availability of in-person appointments. I also discussed with the patient that there may be a patient responsible charge related to this service. The patient expressed understanding and agreed to proceed.  Other persons participating in the visit and their role in the encounter:    Patient's location: Home Provider's location: Office    Principle Diagnosis: 79 year old woman presented with lymphocytosis and anemia.  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  She has a splenomegaly and lymph node involvement only.   Prior Therapy: She is status post bone marrow biopsy completed on March 05, 2020.   Current therapy: Under evaluation for treatment.  Interim History: Ms. Fleagle reports doing reasonably well after receiving packed red cell transfusion.  He tolerated the bone marrow biopsy without any major complications.  She also received 1 unit of packed red cell transfusion with some improvement in her symptoms overall overall he is asymptomatic.  She denies any fevers, chills or sweats.  She denies any shortness of breath or difficulty breathing.  She denies any angina or dizziness.     Medications: I have reviewed the patient's current medications.  Current Outpatient Medications  Medication Sig Dispense Refill  . amLODipine (NORVASC) 5 MG tablet Take 1 tablet (5 mg total) by mouth daily. 90 tablet 3  . Ascorbic Acid (VITAMIN C) 1000 MG tablet Take 1,000 mg by mouth daily.    . calcium carbonate  (OSCAL) 1500 (600 Ca) MG TABS tablet Take 500 mg by mouth daily.    . Cholecalciferol (VITAMIN D) 2000 UNITS tablet Take 4,000 Units by mouth daily.     . Ferrous Sulfate (IRON) 325 (65 Fe) MG TABS Take 325 mg by mouth daily.      No current facility-administered medications for this visit.     Allergies:  Allergies  Allergen Reactions  . Nexium [Esomeprazole Magnesium] Other (See Comments)    Headache          Lab Results: Lab Results  Component Value Date   WBC 26.0 (H) 03/05/2020   HGB 5.9 (LL) 03/05/2020   HCT 18.7 (L) 03/05/2020   MCV 108.1 (H) 03/05/2020   PLT 154 03/05/2020     Chemistry      Component Value Date/Time   NA 139 02/05/2020 1203   K 4.6 02/05/2020 1203   CL 103 02/05/2020 1203   CO2 25 02/05/2020 1203   BUN 18 02/05/2020 1203   CREATININE 1.10 (H) 03/09/2020 1301   CREATININE 1.15 (H) 02/05/2020 1203      Component Value Date/Time   CALCIUM 9.6 02/05/2020 1203   ALKPHOS 86 01/25/2020 1221   AST 20 02/05/2020 1203   ALT 12 02/05/2020 1203   BILITOT 2.9 (H) 02/05/2020 1203          Impression and Plan:   79 year old woman with:  1.  Lymphocytosis and splenomegaly consistent with mantle cell lymphoma diagnosed in December 2021.  The results of her bone marrow biopsy confirmed these findings on March 05, 2020.   The natural course of  this disease and treatment options were reviewed.  Imaging studies completed on March 09, 2020 did not show any massive lymphadenopathy or any disease outside of the bone marrow and splenomegaly.  Systemic chemotherapy utilizing bendamustine and rituximab versus ibrutinib versus active surveillance options were reviewed.  Other than her anemia and she is asymptomatic from these findings.   I recommended treating her anemia first with steroids and if she have persistent cytopenia then cytotoxic therapy would be recommended.  2.  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  I recommended a trial of prednisone and close monitoring at the time being.  Risks and benefits of 60 mg of prednisone daily were reviewed.  Complications that include hyperglycemia, confusion and dyspepsia.  After discussion today, she is agreeable to start and will monitor her counts periodically.   3.  Follow-up: Will be weekly monitor her labs closely and MD follow-up in the next few weeks.  I discussed the assessment and treatment plan with the patient. The patient was provided an opportunity to ask questions and all were answered. The patient agreed with the plan and demonstrated an understanding of the instructions.   The patient was advised to call back or seek an in-person evaluation if the symptoms worsen or if the condition fails to improve as anticipated.  I provided 20 minutes of face-to-face video visit time during this encounter.  The time was dedicated to reviewing imaging studies, pathology results and treatment options for her condition.  Zola Button, MD 03/12/2020 12:52 PM

## 2020-03-13 ENCOUNTER — Encounter (HOSPITAL_COMMUNITY): Payer: Self-pay | Admitting: Oncology

## 2020-03-16 ENCOUNTER — Encounter (HOSPITAL_COMMUNITY): Payer: Self-pay | Admitting: Oncology

## 2020-03-23 ENCOUNTER — Telehealth: Payer: Self-pay | Admitting: Oncology

## 2020-03-23 NOTE — Telephone Encounter (Signed)
Scheduled per los, patient has been called and notified about upcoming appointments.

## 2020-03-24 ENCOUNTER — Telehealth: Payer: Self-pay

## 2020-03-24 NOTE — Telephone Encounter (Signed)
Pt. Called stated she could not come in for lab appointment this week because she has no one to watch her husband who has dementia and was discharged from the hospital. Pt stated she will come in for her appointment on 12/30. Appointment canceled.

## 2020-03-26 ENCOUNTER — Other Ambulatory Visit: Payer: Medicare Other

## 2020-04-02 ENCOUNTER — Encounter: Payer: Self-pay | Admitting: *Deleted

## 2020-04-02 ENCOUNTER — Other Ambulatory Visit: Payer: Self-pay

## 2020-04-02 ENCOUNTER — Inpatient Hospital Stay (HOSPITAL_BASED_OUTPATIENT_CLINIC_OR_DEPARTMENT_OTHER): Payer: Medicare Other | Admitting: Oncology

## 2020-04-02 ENCOUNTER — Inpatient Hospital Stay: Payer: Medicare Other

## 2020-04-02 VITALS — BP 133/48 | HR 96 | Temp 97.8°F | Resp 20 | Ht 59.0 in | Wt 145.0 lb

## 2020-04-02 DIAGNOSIS — D649 Anemia, unspecified: Secondary | ICD-10-CM

## 2020-04-02 DIAGNOSIS — R531 Weakness: Secondary | ICD-10-CM | POA: Diagnosis not present

## 2020-04-02 DIAGNOSIS — C8310 Mantle cell lymphoma, unspecified site: Secondary | ICD-10-CM | POA: Diagnosis not present

## 2020-04-02 DIAGNOSIS — I959 Hypotension, unspecified: Secondary | ICD-10-CM | POA: Diagnosis not present

## 2020-04-02 DIAGNOSIS — Z79899 Other long term (current) drug therapy: Secondary | ICD-10-CM | POA: Diagnosis not present

## 2020-04-02 DIAGNOSIS — R0602 Shortness of breath: Secondary | ICD-10-CM | POA: Diagnosis not present

## 2020-04-02 DIAGNOSIS — R0609 Other forms of dyspnea: Secondary | ICD-10-CM | POA: Diagnosis not present

## 2020-04-02 DIAGNOSIS — R5383 Other fatigue: Secondary | ICD-10-CM | POA: Diagnosis not present

## 2020-04-02 DIAGNOSIS — D591 Autoimmune hemolytic anemia, unspecified: Secondary | ICD-10-CM | POA: Diagnosis not present

## 2020-04-02 LAB — CBC WITH DIFFERENTIAL (CANCER CENTER ONLY)
Abs Immature Granulocytes: 0 10*3/uL (ref 0.00–0.07)
Basophils Absolute: 0 10*3/uL (ref 0.0–0.1)
Basophils Relative: 0 %
Eosinophils Absolute: 0.8 10*3/uL — ABNORMAL HIGH (ref 0.0–0.5)
Eosinophils Relative: 2 %
HCT: 12.2 % — ABNORMAL LOW (ref 36.0–46.0)
Hemoglobin: 4 g/dL — CL (ref 12.0–15.0)
Lymphocytes Relative: 71 %
Lymphs Abs: 30.1 10*3/uL — ABNORMAL HIGH (ref 0.7–4.0)
MCH: 39.2 pg — ABNORMAL HIGH (ref 26.0–34.0)
MCHC: 32.8 g/dL (ref 30.0–36.0)
MCV: 119.6 fL — ABNORMAL HIGH (ref 80.0–100.0)
Monocytes Absolute: 2.5 10*3/uL — ABNORMAL HIGH (ref 0.1–1.0)
Monocytes Relative: 6 %
Neutro Abs: 8.9 10*3/uL — ABNORMAL HIGH (ref 1.7–7.7)
Neutrophils Relative %: 21 %
Platelet Count: 178 10*3/uL (ref 150–400)
RBC: 1.02 MIL/uL — ABNORMAL LOW (ref 3.87–5.11)
RDW: 27.5 % — ABNORMAL HIGH (ref 11.5–15.5)
WBC Count: 42.4 10*3/uL — ABNORMAL HIGH (ref 4.0–10.5)
nRBC: 0.2 % (ref 0.0–0.2)

## 2020-04-02 LAB — CMP (CANCER CENTER ONLY)
ALT: 32 U/L (ref 10–47)
AST: 35 U/L (ref 11–38)
Albumin: 4.3 g/dL (ref 3.5–5.0)
Alkaline Phosphatase: 90 U/L (ref 38–126)
Anion gap: 11 (ref 5–15)
BUN: 31 mg/dL — ABNORMAL HIGH (ref 8–23)
CO2: 23 mmol/L (ref 22–32)
Calcium: 9.8 mg/dL (ref 8.9–10.3)
Chloride: 107 mmol/L (ref 98–111)
Creatinine: 1.32 mg/dL — ABNORMAL HIGH (ref 0.60–1.20)
GFR, Estimated: 41 mL/min — ABNORMAL LOW (ref >60)
Glucose, Bld: 137 mg/dL — ABNORMAL HIGH (ref 70–99)
Potassium: 4.2 mmol/L (ref 3.5–5.1)
Sodium: 141 mmol/L (ref 135–145)
Total Bilirubin: 5.9 mg/dL (ref 0.2–1.6)
Total Protein: 7 g/dL (ref 6.5–8.1)

## 2020-04-02 LAB — PREPARE RBC (CROSSMATCH)

## 2020-04-02 MED ORDER — PREDNISONE 20 MG PO TABS: 20.0000 mg | ORAL_TABLET | Freq: Every day | ORAL | 1 refills | Status: DC

## 2020-04-02 NOTE — Progress Notes (Signed)
CRITICAL VALUE STICKER  CRITICAL VALUE: Hemoglobin 4.0  RECEIVER (on-site recipient of call): Amanda Pea, RN  DATE & TIME NOTIFIED: 04/02/20 @1505   MESSENGER (representative from lab): M.  MD NOTIFIED: Dr. Roseanne Reno  TIME OF NOTIFICATION: 1507  RESPONSE: Acknowledged. Patient to receive 2 units of blood tomorrow or Saturday. Patient has antibodies due to her phenotype and per blood bank blood may not be available until Saturday. Per Dr. Friday it is ok to wait until Saturday but per Dr. Wednesday, patient also made aware to go the the ED with shortness of breath or chest pain. Patient verbalized understanding.

## 2020-04-02 NOTE — Progress Notes (Unsigned)
TBIL RESULT OF 5.9 CALLED TO, READ BACK BY AND VERIFIED WITH DR Theron Arista ENNEVER @ 1700 BY MARSHA KERR MT

## 2020-04-02 NOTE — Progress Notes (Signed)
Called and spoke to [place] in the blood bank to confirm orders received for blood transfusion. Patient is also aware not to remove blue blood bank bracelet. Patient is also aware to go the ED if she develops chest pain or shortness of breath prior to her transfusion appointment on Saturday morning. Patient verbalized understanding.

## 2020-04-02 NOTE — Progress Notes (Signed)
Hematology and Oncology Follow Up   Jacqueline Hancock [id number] 06/13/1940 79 y.o. 04/02/2020 2:55 PM Unk Pinto, MDMcKeown, Gwyndolyn Saxon, MD       Principle Diagnosis: 79 year old woman with mantle cell lymphoma diagnosed in December 2021.  She presented with lymphocytosis and anemia.     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   Prior Therapy: She is status post bone marrow biopsy completed on March 05, 2020.   Current therapy: Prednisone 60 mg daily supposed to start March 13, 2020. Therapy has not been started at this time.  Interim History: Jacqueline Hancock returns today for a follow-up visit.  Since her last visit, she has reported increased fatigue tiredness and dyspnea on exertion. She denies any chest pain or difficulty breathing. She does report early satiety and weight loss. She denies any fevers, chills or sweats. She denies any hospitalization or illnesses.     Medications: Unchanged on review. Current Outpatient Medications  Medication Sig Dispense Refill  . amLODipine (NORVASC) 5 MG tablet Take 1 tablet (5 mg total) by mouth daily. 90 tablet 3  . Ascorbic Acid (VITAMIN C) 1000 MG tablet Take 1,000 mg by mouth daily.    . calcium carbonate (OSCAL) 1500 (600 Ca) MG TABS tablet Take 500 mg by mouth daily.    . Cholecalciferol (VITAMIN D) 2000 UNITS tablet Take 4,000 Units by mouth daily.     . Ferrous Sulfate (IRON) 325 (65 Fe) MG TABS Take 325 mg by mouth daily.      No current facility-administered medications for this visit.     Allergies:  Allergies  Allergen Reactions  . Nexium [Esomeprazole Magnesium] Other (See Comments)    Headache   Physical exam   Blood pressure (!) 133/48, pulse 96, temperature 97.8 F (36.6 C), temperature source Tympanic, resp. rate 20, height 4' 11"  (1.499 m), weight 145 lb (65.8 kg), SpO2 100 %.    General appearance: Comfortable appearing appeared in mild discomfort. Head: Normocephalic  without any trauma Oropharynx: Mucous membranes are moist and pink without any thrush or ulcers. Eyes: Pupils are equal and round reactive to light. Lymph nodes: No cervical, supraclavicular, inguinal or axillary lymphadenopathy.   Heart:regular rate and rhythm.  S1 and S2 without leg edema. Lung: Clear without any rhonchi or wheezes.  No dullness to percussion. Abdomin: Soft, nontender, nondistended with good bowel sounds.  No hepatosplenomegaly. Musculoskeletal: No joint deformity or effusion.  Full range of motion noted. Neurological: No deficits noted on motor, sensory and deep tendon reflex exam. Skin: No petechial rash or dryness.  Appeared moist.      Lab Results: Lab Results  Component Value Date   WBC 26.0 (H) 03/05/2020   HGB 5.9 (LL) 03/05/2020   HCT 18.7 (L) 03/05/2020   MCV 108.1 (H) 03/05/2020   PLT 154 03/05/2020     Chemistry      Component Value Date/Time   NA 139 02/05/2020 1203   K 4.6 02/05/2020 1203   CL 103 02/05/2020 1203   CO2 25 02/05/2020 1203   BUN 18 02/05/2020 1203   CREATININE 1.10 (H) 03/09/2020 1301   CREATININE 1.15 (H) 02/05/2020 1203      Component Value Date/Time   CALCIUM 9.6 02/05/2020 1203   ALKPHOS 86 01/25/2020 1221   AST 20 02/05/2020 1203   ALT 12 02/05/2020 1203   BILITOT 2.9 (H) 02/05/2020 1203          Impression and Plan:   79 year old woman with:  1.  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  She was found to have lymphocytosis and splenomegaly with bone marrow biopsy confirmed these findings.  Treatment options were reviewed at this time which include active surveillance versus oral targeted therapy with ibrutinib versus systemic chemotherapy.  Risks and benefits of all these treatments were discussed at this time.  Complications were reviewed specifically. We will tentatively start this in the near future once her hemoglobin stabilized.  Given the rapid rise in her white cell count, I would favor  starting therapy sooner than later once her autoimmune hemolytic anemia is stabilized.  2.  Autoimmune hemolytic anemia: She has not started prednisone with hemoglobin expectedly low at this time. We will arrange for immediate transfusion and start prednisone therapy. She will start 60 mg daily and will monitor her counts weekly. I emphasized the importance of adhering to medication and her appointments to ensure the best possible outcome.   3.  Follow-up: Will be in the immediate future for packed red cell transfusion and in the next 2 weeks for repeat laboratory testing.  30  minutes were spent on this encounter.  Time was dedicated to reviewing her disease status, reviewing laboratory data and outlining future plan of care.   Zola Button, MD 04/02/2020 2:55 PM

## 2020-04-04 ENCOUNTER — Other Ambulatory Visit: Payer: Self-pay

## 2020-04-04 ENCOUNTER — Inpatient Hospital Stay: Payer: Medicare Other | Attending: Oncology

## 2020-04-04 DIAGNOSIS — R600 Localized edema: Secondary | ICD-10-CM | POA: Diagnosis not present

## 2020-04-04 DIAGNOSIS — Z79899 Other long term (current) drug therapy: Secondary | ICD-10-CM | POA: Diagnosis not present

## 2020-04-04 DIAGNOSIS — R140 Abdominal distension (gaseous): Secondary | ICD-10-CM | POA: Insufficient documentation

## 2020-04-04 DIAGNOSIS — C8310 Mantle cell lymphoma, unspecified site: Secondary | ICD-10-CM | POA: Insufficient documentation

## 2020-04-04 DIAGNOSIS — Z7952 Long term (current) use of systemic steroids: Secondary | ICD-10-CM | POA: Diagnosis not present

## 2020-04-04 DIAGNOSIS — D591 Autoimmune hemolytic anemia, unspecified: Secondary | ICD-10-CM | POA: Diagnosis not present

## 2020-04-04 DIAGNOSIS — D649 Anemia, unspecified: Secondary | ICD-10-CM

## 2020-04-04 MED ORDER — SODIUM CHLORIDE 0.9% IV SOLUTION
250.0000 mL | Freq: Once | INTRAVENOUS | Status: AC
  Administered 2020-04-04: 250 mL via INTRAVENOUS
  Filled 2020-04-04: qty 250

## 2020-04-04 MED ORDER — DIPHENHYDRAMINE HCL 25 MG PO CAPS
25.0000 mg | ORAL_CAPSULE | Freq: Once | ORAL | Status: AC
  Administered 2020-04-04: 25 mg via ORAL

## 2020-04-04 MED ORDER — ACETAMINOPHEN 325 MG PO TABS
650.0000 mg | ORAL_TABLET | Freq: Once | ORAL | Status: AC
  Administered 2020-04-04: 650 mg via ORAL

## 2020-04-04 NOTE — Patient Instructions (Signed)

## 2020-04-05 LAB — TYPE AND SCREEN
ABO/RH(D): B POS
Antibody Screen: POSITIVE
DAT, IgG: POSITIVE
Unit division: 0
Unit division: 0

## 2020-04-05 LAB — BPAM RBC
Blood Product Expiration Date: 202201152359
Blood Product Expiration Date: 202201152359
ISSUE DATE / TIME: 202201010900
ISSUE DATE / TIME: 202201011035
Unit Type and Rh: 1700
Unit Type and Rh: 7300

## 2020-04-13 ENCOUNTER — Inpatient Hospital Stay: Payer: Medicare Other

## 2020-04-13 ENCOUNTER — Telehealth: Payer: Self-pay | Admitting: Pharmacist

## 2020-04-13 ENCOUNTER — Other Ambulatory Visit: Payer: Self-pay

## 2020-04-13 ENCOUNTER — Other Ambulatory Visit: Payer: Self-pay | Admitting: Oncology

## 2020-04-13 ENCOUNTER — Inpatient Hospital Stay (HOSPITAL_BASED_OUTPATIENT_CLINIC_OR_DEPARTMENT_OTHER): Payer: Medicare Other | Admitting: Oncology

## 2020-04-13 ENCOUNTER — Telehealth: Payer: Self-pay

## 2020-04-13 VITALS — BP 132/62 | HR 80 | Temp 97.8°F | Resp 20 | Ht 59.0 in | Wt 145.8 lb

## 2020-04-13 DIAGNOSIS — D591 Autoimmune hemolytic anemia, unspecified: Secondary | ICD-10-CM | POA: Diagnosis not present

## 2020-04-13 DIAGNOSIS — C8310 Mantle cell lymphoma, unspecified site: Secondary | ICD-10-CM | POA: Diagnosis not present

## 2020-04-13 DIAGNOSIS — R600 Localized edema: Secondary | ICD-10-CM | POA: Diagnosis not present

## 2020-04-13 DIAGNOSIS — D649 Anemia, unspecified: Secondary | ICD-10-CM

## 2020-04-13 DIAGNOSIS — Z7952 Long term (current) use of systemic steroids: Secondary | ICD-10-CM | POA: Diagnosis not present

## 2020-04-13 DIAGNOSIS — R140 Abdominal distension (gaseous): Secondary | ICD-10-CM | POA: Diagnosis not present

## 2020-04-13 DIAGNOSIS — Z79899 Other long term (current) drug therapy: Secondary | ICD-10-CM | POA: Diagnosis not present

## 2020-04-13 LAB — CBC WITH DIFFERENTIAL (CANCER CENTER ONLY)
Abs Immature Granulocytes: 0 10*3/uL (ref 0.00–0.07)
Basophils Absolute: 0 10*3/uL (ref 0.0–0.1)
Basophils Relative: 0 %
Eosinophils Absolute: 0 10*3/uL (ref 0.0–0.5)
Eosinophils Relative: 0 %
HCT: 22.5 % — ABNORMAL LOW (ref 36.0–46.0)
Hemoglobin: 7.5 g/dL — ABNORMAL LOW (ref 12.0–15.0)
Lymphocytes Relative: 78 %
Lymphs Abs: 22.9 10*3/uL — ABNORMAL HIGH (ref 0.7–4.0)
MCH: 38.3 pg — ABNORMAL HIGH (ref 26.0–34.0)
MCHC: 33.3 g/dL (ref 30.0–36.0)
MCV: 114.8 fL — ABNORMAL HIGH (ref 80.0–100.0)
Monocytes Absolute: 0.6 10*3/uL (ref 0.1–1.0)
Monocytes Relative: 2 %
Neutro Abs: 5.9 10*3/uL (ref 1.7–7.7)
Neutrophils Relative %: 20 %
Platelet Count: 96 10*3/uL — ABNORMAL LOW (ref 150–400)
RBC: 1.96 MIL/uL — ABNORMAL LOW (ref 3.87–5.11)
WBC Count: 29.4 10*3/uL — ABNORMAL HIGH (ref 4.0–10.5)
nRBC: 0.1 % (ref 0.0–0.2)

## 2020-04-13 LAB — CMP (CANCER CENTER ONLY)
ALT: 29 U/L (ref 0–44)
AST: 16 U/L (ref 15–41)
Albumin: 3.8 g/dL (ref 3.5–5.0)
Alkaline Phosphatase: 83 U/L (ref 38–126)
Anion gap: 9 (ref 5–15)
BUN: 26 mg/dL — ABNORMAL HIGH (ref 8–23)
CO2: 27 mmol/L (ref 22–32)
Calcium: 9.8 mg/dL (ref 8.9–10.3)
Chloride: 103 mmol/L (ref 98–111)
Creatinine: 1.15 mg/dL — ABNORMAL HIGH (ref 0.44–1.00)
GFR, Estimated: 48 mL/min — ABNORMAL LOW (ref >60)
Glucose, Bld: 111 mg/dL — ABNORMAL HIGH (ref 70–99)
Potassium: 4.6 mmol/L (ref 3.5–5.1)
Sodium: 139 mmol/L (ref 135–145)
Total Bilirubin: 2.9 mg/dL — ABNORMAL HIGH (ref 0.3–1.2)
Total Protein: 6.8 g/dL (ref 6.5–8.1)

## 2020-04-13 LAB — SAMPLE TO BLOOD BANK

## 2020-04-13 MED ORDER — PREDNISONE 5 MG PO TABS: ORAL_TABLET | ORAL | 0 refills | Status: DC

## 2020-04-13 MED ORDER — IBRUTINIB 560 MG PO TABS: 560.0000 mg | ORAL_TABLET | Freq: Every day | ORAL | 0 refills | Status: DC

## 2020-04-13 NOTE — Telephone Encounter (Signed)
Oral Oncology Patient Advocate Encounter  Received notification from [facility] that prior authorization for Imbruvica is required.  PA submitted on CoverMyMeds Key [id number] Status is pending  Oral Oncology Clinic will continue to follow.  [place] Patient [place] Phone [phone] Fax [phone] 04/13/2020 10:46 AM

## 2020-04-13 NOTE — Progress Notes (Signed)
Hematology and Oncology Follow Up   Jacqueline Hancock [id number] Apr 13, 1940 80 y.o. 04/13/2020 9:28 AM Unk Pinto, MDMcKeown, Gwyndolyn Saxon, MD       Principle Diagnosis: 80 year old woman with mantle cell lymphoma presenting with lymphocytosis and autoimmune hemolytic anemia in December 2021.    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   Prior Therapy:   She is status post bone marrow biopsy completed on March 05, 2020.  He status post a supportive transfusion as needed.  Current therapy: Prednisone 20 mg daily started on April 02, 2020.  Interim History: Jacqueline Hancock is here for return evaluation.  Since the last visit, she presented with severe autoimmune hemolytic anemia related to her lymphoma.  She did receive packed red cell transfusion and started on prednisone therapy.  Since the last visit, she reported significant improvement in her symptoms including shortness of breath and dyspnea exertion that has resolved.  She has reported symptoms of lower extremity edema bloating associated with prednisone.  He denies any fevers chills sweats.  She denies any hospitalization or illnesses.     Medications: Updated on review. Current Outpatient Medications  Medication Sig Dispense Refill  . amLODipine (NORVASC) 5 MG tablet Take 1 tablet (5 mg total) by mouth daily. 90 tablet 3  . Ascorbic Acid (VITAMIN C) 1000 MG tablet Take 1,000 mg by mouth daily.    . calcium carbonate (OSCAL) 1500 (600 Ca) MG TABS tablet Take 500 mg by mouth daily.    . Cholecalciferol (VITAMIN D) 2000 UNITS tablet Take 4,000 Units by mouth daily.     . Ferrous Sulfate (IRON) 325 (65 Fe) MG TABS Take 325 mg by mouth daily.     . predniSONE (DELTASONE) 20 MG tablet Take 1 tablet (20 mg total) by mouth daily with breakfast. 90 tablet 1   No current facility-administered medications for this visit.     Allergies:  Allergies  Allergen Reactions  . Nexium [Esomeprazole Magnesium]  Other (See Comments)    Headache   Physical exam  Blood pressure 132/62, pulse 80, temperature 97.8 F (36.6 C), resp. rate 20, height 4' 11"  (1.499 m), weight 145 lb 12.8 oz (66.1 kg), SpO2 100 %.   ECOG 1   General appearance: Alert, awake without any distress. Head: Atraumatic without abnormalities Oropharynx: Without any thrush or ulcers. Eyes: No scleral icterus. Lymph nodes: No lymphadenopathy noted in the cervical, supraclavicular, or axillary nodes Heart:regular rate and rhythm, without any murmurs or gallops.   Lung: Clear to auscultation without any rhonchi, wheezes or dullness to percussion. Abdomin: Soft, nontender without any shifting dullness or ascites. Musculoskeletal: No clubbing or cyanosis. Neurological: No motor or sensory deficits. Skin: No rashes or lesions.      Lab Results: Lab Results  Component Value Date   WBC 42.4 (H) 04/02/2020   HGB 4.0 (LL) 04/02/2020   HCT 12.2 (L) 04/02/2020   MCV 119.6 (H) 04/02/2020   PLT 178 04/02/2020     Chemistry      Component Value Date/Time   NA 141 04/02/2020 1448   K 4.2 04/02/2020 1448   CL 107 04/02/2020 1448   CO2 23 04/02/2020 1448   BUN 31 (H) 04/02/2020 1448   CREATININE 1.32 (H) 04/02/2020 1448   CREATININE 1.15 (H) 02/05/2020 1203      Component Value Date/Time   CALCIUM 9.8 04/02/2020 1448   ALKPHOS 90 04/02/2020 1448   AST 35 04/02/2020 1448   ALT 32 04/02/2020 1448   BILITOT  5.9 [facility]) 04/02/2020 1448          Impression and Plan:   80 year old woman with:  1.  Mantle cell lymphoma presented with lymphocytosis and autoimmune hemolytic anemia in December 2021.  Treatment options were reviewed at this time including Bendamustine and rituximab versus oral targeted therapy.  Risks and benefits of both approaches were discussed at this time.  Complications associated with both treatments were reviewed.  After discussion today, we have elected to proceed with ibrutinib as a single  agent.  She prefers not to receive chemotherapy as she is caring for her significant other.  Complication associated with this treatment include nausea, fatigue, cardiac arrhythmia, palpitation and bleeding.  2.  Autoimmune hemolytic anemia: She is currently on prednisone and required packed red cell transfusion.  Her hemoglobin has improved and that we will decrease the prednisone dose to 10 mg daily and will taper slowly.   3.  Follow-up: Will be in the next few weeks to follow her progress.  30  minutes were spent on this visit.  The time was dedicated to reviewing laboratory data, disease status update, different treatment options and addressing complications related to her cancer and cancer therapy.   Zola Button, MD 04/13/2020 9:28 AM

## 2020-04-13 NOTE — Telephone Encounter (Signed)
Oral Oncology Pharmacist Encounter  Received new prescription for Imbruvica (ibrutinib) for the treatment of mantle cell lymphoma, planned duration until disease progression or unacceptable drug toxicity.  Prescription dose and frequency assessed for appropriateness.  CBC w/ Diff and CMP from 04/13/20 assessed, noted WBC down from 42.4 K/uL to 29.4 K/uL, Hgb 7.5 g/dL (patient received 2 units PRBC on 04/02/20), and pltc of 96 K/uL. CMP noted patient with Scr 1.15 mg/dL (CrCl ~48 mL/min) - no renal dose adjustments required. T. Bili is down from 5.9 mg/dL on 04/02/20 to 2.9 mg/dL, all other LFTs stable and WNL.   Current medication list in Epic reviewed, no relevant/significant DDIs with Imbruvica identified.  Evaluated chart and no patient barriers to medication adherence noted.   Prescription has been e-scribed to the [facility] for benefits analysis and approval.  Oral Oncology Clinic will continue to follow for insurance authorization, copayment issues, initial counseling and start date.  Leron Croak, PharmD, BCPS Hematology/Oncology Clinical Pharmacist [facility] [phone] 04/13/2020 10:33 AM

## 2020-04-15 NOTE — Telephone Encounter (Signed)
Oral Oncology Pharmacist Encounter   Prior Authorization for Imbruvica (ibrutinib) has been denied.     Appeal letter sent to [employer] Appeals and Grievance Department with supporting documentation. Appeal faxed to: [phone].    Oral Oncology Clinic will continue to follow.    Leron Croak, PharmD, BCPS Hematology/Oncology Clinical Pharmacist [facility] [phone] 04/15/2020 10:44 AM

## 2020-04-22 ENCOUNTER — Telehealth: Payer: Self-pay

## 2020-04-22 NOTE — Telephone Encounter (Signed)
Oral Oncology Patient Advocate Encounter  Was successful in securing patient a $8500 grant from [employer] to provide copayment coverage for Imbruvica.  This will keep the out of pocket expense at $0.     [employer] ID:   I have spoken with the patient.   The billing information is as follows and has been shared with [name]: [id number] PCN: [id number] Member ID: [id number] Group ID: [id number] Dates of Eligibility: 03/23/20 through 12/19/212  Fund:  [place] Patient Jacqueline Hancock Phone [phone] Fax [phone] 04/22/2020 11:21 AM

## 2020-04-22 NOTE — Telephone Encounter (Signed)
Oral Oncology Patient Advocate Encounter  Was successful in securing patient a $ 10000 grant from [employer] [place] [employer] to provide copayment coverage for his Imbruvica.  This will keep the out of pocket expense at $0.    I have left a message for the patient.    The billing information is as follows and has been shared with [place].   Member ID: [id number] Group ID: [id number] RxBin: [id number] Dates of Eligibility: 100/21/21 through 04/22/21  [place] Patient [place] Phone [phone] Fax [phone] 04/22/2020 11:18 AM

## 2020-04-22 NOTE — Telephone Encounter (Signed)
Oral Chemotherapy Pharmacist Encounter   Attempted to reach patient to provide update and offer for initial counseling on oral medication: Imbruvica (ibrutinib).   No answer. Left voicemail for patient to call back to discuss details of medication acquisition and initial counseling session.  Leron Croak, PharmD, BCPS Hematology/Oncology Clinical Pharmacist [facility] [phone] 04/22/2020 2:31 PM

## 2020-04-24 MED FILL — IMBRUVICA 560 MG TAB: 560 | 30 days supply | Qty: 30 | Fill #0

## 2020-04-24 NOTE — Telephone Encounter (Signed)
Oral Chemotherapy Pharmacist Encounter  I spoke with patient for overview of: Imbruvica (ibrutinib) for the treatment of mantle cell lymphoma, planned duration until disease progression or unacceptable toxicity.   Counseled patient on administration, dosing, side effects, monitoring, drug-food interactions, safe handling, storage, and disposal.  Patient will take Imbruvica 560mg  tablets, 1 tablet (560mg ) by mouth once daily.  Patient will take Imbruvica at approximately the same time each day with a full glass of water and maintain adequate hydration throughout the day.  Patient knows to avoid grapefruit or grapefruit juice while on therapy with Imbruvica.  Imbruvica start date: 04/28/20  Adverse effects include but are not limited to: bruising, decreased blood counts, N/V, diarrhea, musculoskeletal pain, arthralgias, peripheral edema, and hemorrhage.   Patient will obtain anti diarrheal and alert the office of 4 or more loose stools above baseline.  Reviewed with patient importance of keeping a medication schedule and plan for any missed doses. No barriers to medication adherence identified.  Medication reconciliation performed and medication/allergy list updated.  Insurance authorization for Kate Sable has been obtained. Fatima Sanger obtained for patient making copayment $0 for 1st fill of Imbruvica. This will ship from the [facility] on 04/24/20 to deliver to patient's home on 04/27/20.  Patient informed the pharmacy will reach out 5-7 days prior to needing next fill of Imbruvica to coordinate continued medication acquisition to prevent break in therapy.  All questions answered.  Ms. Rost voiced understanding and appreciation.   Medication education handout placed in mail for patient. Patient knows to call the office with questions or concerns. Oral Chemotherapy Clinic phone number provided to patient.   Leron Croak, PharmD, BCPS Hematology/Oncology Clinical  Pharmacist [facility] [phone] 04/24/2020 3:09 PM

## 2020-05-01 ENCOUNTER — Inpatient Hospital Stay: Payer: Medicare Other

## 2020-05-01 ENCOUNTER — Inpatient Hospital Stay: Payer: Medicare Other | Admitting: Oncology

## 2020-05-05 ENCOUNTER — Telehealth: Payer: Self-pay | Admitting: Oncology

## 2020-05-05 NOTE — Telephone Encounter (Signed)
Called pt per 12/8 sch msg- no answer. Left message for patient to call back to reschedule appt.   

## 2020-05-07 ENCOUNTER — Ambulatory Visit (INDEPENDENT_AMBULATORY_CARE_PROVIDER_SITE_OTHER): Payer: Medicare Other | Admitting: Adult Health Nurse Practitioner

## 2020-05-07 ENCOUNTER — Other Ambulatory Visit: Payer: Self-pay

## 2020-05-07 ENCOUNTER — Encounter: Payer: Self-pay | Admitting: Adult Health Nurse Practitioner

## 2020-05-07 VITALS — BP 120/68 | HR 69 | Temp 97.5°F | Wt 144.0 lb

## 2020-05-07 DIAGNOSIS — Z0001 Encounter for general adult medical examination with abnormal findings: Secondary | ICD-10-CM

## 2020-05-07 DIAGNOSIS — R6889 Other general symptoms and signs: Secondary | ICD-10-CM

## 2020-05-07 DIAGNOSIS — M8580 Other specified disorders of bone density and structure, unspecified site: Secondary | ICD-10-CM

## 2020-05-07 DIAGNOSIS — R7309 Other abnormal glucose: Secondary | ICD-10-CM

## 2020-05-07 DIAGNOSIS — K219 Gastro-esophageal reflux disease without esophagitis: Secondary | ICD-10-CM | POA: Diagnosis not present

## 2020-05-07 DIAGNOSIS — I10 Essential (primary) hypertension: Secondary | ICD-10-CM

## 2020-05-07 DIAGNOSIS — Z0000 Encounter for general adult medical examination without abnormal findings: Secondary | ICD-10-CM

## 2020-05-07 DIAGNOSIS — E785 Hyperlipidemia, unspecified: Secondary | ICD-10-CM | POA: Diagnosis not present

## 2020-05-07 DIAGNOSIS — N183 Chronic kidney disease, stage 3 unspecified: Secondary | ICD-10-CM | POA: Diagnosis not present

## 2020-05-07 DIAGNOSIS — Z6827 Body mass index (BMI) 27.0-27.9, adult: Secondary | ICD-10-CM

## 2020-05-07 DIAGNOSIS — Z79899 Other long term (current) drug therapy: Secondary | ICD-10-CM | POA: Diagnosis not present

## 2020-05-07 DIAGNOSIS — E559 Vitamin D deficiency, unspecified: Secondary | ICD-10-CM | POA: Diagnosis not present

## 2020-05-07 NOTE — Patient Instructions (Addendum)
   Try Famotidine (Pedsid) 20mg   You can take this twice a day morning and night.  Try taking once a day in the morning.  If this is not lasting, you may take a second one 50min or so before you go to bed.   Saline Nasal Spray: You can get this at any pharmacy Use as directed on package This will help to sooth inside of your nose from irritation  [facility] Sinus Rinse / Neti Pot Use warm bottled or distilled water DO NOT use tap water! Use twice a day as needed This will help to sooth irritated sinuses and clear nasal congestion If using nasal sprays, do so after completing this.  Flonase: One spray in each nostril daily  This will help to open your nasal passages Use this AFTER you do any type of nasal rinse

## 2020-05-07 NOTE — Progress Notes (Signed)
[place] [facility]  Assessment / Plan   [name] was seen today for annual exam.  Diagnoses and all orders for this visit:  Encounter for Medicare annual wellness  Yearly  Essential hypertension Continue current medications: Amlodipine 5mg  Monitor blood pressure at home; call if consistently over 130/80 Continue DASH diet.   Reminder to go to the ER if any CP, SOB, nausea, dizziness, severe HA, changes vision/speech, left arm numbness and tingling and jaw pain. -     CBC with Differential/Platelet -     COMPLETE METABOLIC PANEL WITH GFR -     amLODipine (NORVASC) 5 MG tablet; Take 1 tablet (5 mg total) by mouth daily.  Hyperlipidemia, unspecified hyperlipidemia type Controlled with diet and exercise Discussed dietary and exercise modifications Low fat diet -     Lipid panel  Gastroesophageal reflux disease, unspecified whether esophagitis present Doing well at this time Diet discussed Monitor for triggers Avoid food with high acid content Avoid excessive cafeine Increase water intake   Vitamin D deficiency Continue supplementation to maintain goal of 70-100 Taking Vitamin D 5,000 IU daily -     VITAMIN D 25 Hydroxy (Vit-D Deficiency, Fractures)  Osteopenia, unspecified location DEXA UTD, Q2 Follows with GYN  Stage 3 chronic kidney disease, 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  Future Appointments  Date Time Provider [place]  06/10/2020   9:30 AM [facility] None  06/10/2020 10:00 AM Wyatt Portela, MD [facility] None  08/05/2020 10:30 AM Garnet Sierras, NP [facility] None  12/28/2020 10:00 AM Garnet Sierras, NP [facility] None  05/17/2021 10:30 AM Garnet Sierras, NP [facility] None   . Plan:   During the course of the visit the patient was educated and counseled about appropriate screening and preventive services including:    Pneumococcal vaccine   Prevnar 13  Influenza vaccine  Td vaccine  Screening electrocardiogram  Bone densitometry screening  Colorectal cancer screening  Diabetes screening  Glaucoma screening  Nutrition counseling   Advanced directives: requested    Subjective:   Jacqueline Hancock is a 80 y.o.  female who presents for Medicare Annual Wellness and 3 month follow up on HTN, HLD, prediabetes/abnormal glucose, vitamin D def.   She does not have any health or medication concerns today.  BMI is Body mass index is 29.08 kg/m., she is working on diet and exercise. Wt Readings from Last 3 Encounters:  05/07/20 144 lb (65.3 kg)  04/13/20 145 lb 12.8 oz (66.1 kg)  04/02/20 145 lb (65.8 kg)    Her blood pressure has been controlled at home, today their BP is BP: 120/68 She does workout, does it in her house. She denies chest pain, shortness of breath, dizziness.  She is not on cholesterol medication and denies myalgias. Her cholesterol is at goal. The cholesterol last visit was:   Lab Results  Component Value Date   CHOL 96 12/24/2019   HDL 28 (L) 12/24/2019   LDLCALC 49 12/24/2019   TRIG 104 12/24/2019   CHOLHDL 3.4 12/24/2019  She has been working on diet and exercise for prediabetes, and denies paresthesia of the feet, polydipsia, polyuria and visual disturbances. Last A1C in the office was:  Lab Results  Component Value Date   HGBA1C 4.5 12/24/2019   Lab Results  Component Value Date   GFRAA 54 (L) 05/07/2020   Patient is on Vitamin D supplement. Lab  Results  Component Value Date   VD25OH 92 12/24/2019      Medication Review Current Outpatient Medications on File Prior to Visit  Medication Sig Dispense Refill  . amLODipine (NORVASC) 5 MG tablet Take 1 tablet (5 mg total) by mouth daily. 90 tablet 3  . Ascorbic Acid (VITAMIN C) 1000 MG tablet Take 1,000 mg by mouth daily.    . calcium carbonate (OSCAL) 1500 (600 Ca) MG TABS tablet Take 500 mg by mouth daily.    . Cholecalciferol (VITAMIN D) 2000 UNITS tablet Take 4,000 Units by mouth daily.     . Ferrous Sulfate (IRON) 325 (65 Fe) MG TABS Take 325 mg by mouth daily.     Marland Kitchen ibrutinib 560 MG TABS Take 560 mg by mouth daily. 30 tablet 0  . [DISCONTINUED] gabapentin (NEURONTIN) 100 MG capsule Take 1 capsule 3 x /day for Neuropathy Pain (Patient not taking: Reported on 01/25/2020) 270 capsule 0   No current facility-administered medications on file prior to visit.    Current Problems (verified) Patient Active Problem List   Diagnosis Date Noted  . Lymphocytosis 01/25/2020  . Anemia due to chronic kidney disease 01/25/2020  . Anemia 01/25/2020  . CKD (chronic kidney disease) stage 3, GFR 30-59 ml/min (HCC) 09/10/2015  . Vitamin D deficiency 06/20/2013  . Medication management 06/20/2013  . Hypertension   . Hyperlipidemia   . Abnormal glucose   . GERD (gastroesophageal reflux disease)   . Osteopenia     MEDICARE WELLNESS OBJECTIVES: Physical activity: Current Exercise Habits: The patient does not participate in regular exercise at present, Exercise limited by: None identified Cardiac risk factors: Cardiac Risk Factors include: advanced age (>72men, >16 women);hypertension;sedentary lifestyle Depression/mood screen:   Depression screen [facility] 2/9 05/07/2020  Decreased Interest 0  Down, Depressed, Hopeless 0  PHQ - 2 Score 0    ADLs:  In your present state of health, do you have any difficulty performing the following activities: 05/07/2020 03/05/2020  Hearing? N N  Vision? N N   Difficulty concentrating or making decisions? N N  Walking or climbing stairs? N N  Dressing or bathing? N N  Doing errands, shopping? N -  Preparing Food and eating ? N -  Using the Toilet? N -  In the past six months, have you accidently leaked urine? N -  Do you have problems with loss of bowel control? N -  Managing your Medications? N -  Managing your Finances? N -  Housekeeping or managing your Housekeeping? N -  Some recent data might be hidden     Cognitive Testing  Alert? Yes  Normal Appearance?Yes  Oriented to person? Yes  Place? Yes   Time? Yes  Recall of three objects?  Yes  Can perform simple calculations? Yes  Displays appropriate judgment?Yes  Can read the correct time from a watch face?Yes  EOL planning: Does Patient Have a Medical Advance Directive?: Yes Type of Advance Directive: [place] will Does patient want to make changes to medical advance directive?: No - Patient declined Copy of [place] in Chart?: No - copy requested  Screening Tests Immunization History  Administered Date(s) Administered  . Influenza, High Dose Seasonal PF 01/28/2015, 12/11/2015, 01/25/2017, 01/22/2018, 01/10/2019, 02/03/2019, 02/05/2020  . Influenza-Unspecified 01/29/2013, 12/27/2013, 01/25/2017  . PFIZER(Purple Top)SARS-COV-2 Vaccination 05/11/2019, 06/03/2019  . PPD Test 02/25/2013  . Pneumococcal Conjugate-13 01/24/2017  . Pneumococcal Polysaccharide-23 02/25/2013  . Td 09/13/2001, 02/20/2012  . Tdap 02/25/2013  . Zoster 10/23/2006   Preventative care: Last colonoscopy:  2019 due in 5years Dr Earlean Shawl Last mammogram: 03/2016 DUE scheduled for 02/2020 Last pap smear/pelvic exam: 2015 at GYN qQ2 years DEXA: 03/2018 osteopenia, Ca & Vit D CXR 2012    Prior vaccinations: TD or Tdap: 2014  Influenza:  2019 Pneumococcal: 2014 Prevnar13: 2018 Shingles/Zostavax: 2008  Names of Other Physician/Practitioners you currently  use: 1. [facility]- here for primary care 2. Dr. Gershon Crane, eye doctor, 2021, due for 2022 3. Has partial, dentist, last visit 2 years  Patient Care Team: Unk Pinto, MD as PCP - General (Internal Medicine) Jodi Marble, MD as Consulting Physician (Otolaryngology) Richmond Campbell, MD as Consulting Physician (Gastroenterology) Leo Grosser, Seymour Bars, MD (Inactive) as Consulting Physician (Obstetrics and Gynecology) Rutherford Guys, MD as Consulting Physician (Ophthalmology)  Allergies Allergies  Allergen Reactions  . Nexium [Esomeprazole Magnesium] Other (See Comments)    Headache    SURGICAL HISTORY She  has a past surgical history that includes Parotid gland tumor excision (2012); Abdominal hysterectomy; Appendectomy; Tonsillectomy and adenoidectomy; and Cataract extraction, bilateral (Bilateral, 09/2017). FAMILY HISTORY Her family history includes Diabetes in her brother, brother, and sister; Heart disease in her brother; Hypertension in her father; Leukemia in her mother. SOCIAL HISTORY She  reports that she has never smoked. She has never used smokeless tobacco. She reports that she does not drink alcohol and does not use drugs.    Objective:   Today's Vitals   05/07/20 1132  BP: 120/68  Pulse: 69  Temp: (!) 97.5 F (36.4 C)  SpO2: 99%  Weight: 144 lb (65.3 kg)   Body mass index is 29.08 kg/m.  General appearance: alert, no distress, WD/WN,  female HEENT: normocephalic, sclerae anicteric, TMs pearly, nares patent, no discharge or erythema, pharynx normal Oral cavity: MMM, no lesions Neck: supple, no lymphadenopathy, no thyromegaly, no masses Heart: RRR, normal S1, S2, no murmurs Lungs: CTA bilaterally, no wheezes, rhonchi, or rales Abdomen: +bs, soft, + epigastric tender, non distended, no masses, no hepatomegaly, no splenomegaly Musculoskeletal: nontender, no swelling, no obvious deformity Extremities: no edema, no  cyanosis, no clubbing Pulses: 2+ symmetric, upper and lower extremities, normal cap refill Neurological: alert, oriented x 3, CN2-12 intact, strength normal upper extremities and lower extremities, sensation normal throughout, DTRs 2+ throughout, no cerebellar signs, gait normal Psychiatric: normal affect, behavior normal, pleasant  Skin: seb cyst right post auricular, no lymphadenopathy Breast: defer Gyn: defer Rectal: defer   Medicare Attestation I have personally reviewed: The patient's medical and social history Their use of alcohol, tobacco or illicit drugs Their current medications and supplements The patient's functional ability including ADLs,fall risks, home safety risks, cognitive, and hearing and visual impairment Diet and physical activities Evidence for depression or mood disorders  The patient's weight, height, BMI, and visual acuity have been recorded in the chart.  I have made referrals, counseling, and provided education to the patient based on review of the above and I have provided the patient with a written personalized care plan for preventive services.    Garnet Sierras, NP   05/07/2020

## 2020-05-08 LAB — CBC WITH DIFFERENTIAL/PLATELET
Absolute Monocytes: 4835 cells/uL — ABNORMAL HIGH (ref 200–950)
Basophils Absolute: 85 cells/uL (ref 0–200)
Basophils Relative: 0.4 %
Eosinophils Absolute: 85 cells/uL (ref 15–500)
Eosinophils Relative: 0.4 %
HCT: 28.2 % — ABNORMAL LOW (ref 35.0–45.0)
Hemoglobin: 9.9 g/dL — ABNORMAL LOW (ref 11.7–15.5)
Lymphs Abs: 13760 cells/uL — ABNORMAL HIGH (ref 850–3900)
MCH: 36.9 pg — ABNORMAL HIGH (ref 27.0–33.0)
MCHC: 35.1 g/dL (ref 32.0–36.0)
MCV: 105.2 fL — ABNORMAL HIGH (ref 80.0–100.0)
MPV: 11.4 fL (ref 7.5–12.5)
Monocytes Relative: 22.7 %
Neutro Abs: 2535 cells/uL (ref 1500–7800)
Neutrophils Relative %: 11.9 %
Platelets: 123 10*3/uL — ABNORMAL LOW (ref 140–400)
RBC: 2.68 10*6/uL — ABNORMAL LOW (ref 3.80–5.10)
RDW: 14.2 % (ref 11.0–15.0)
Total Lymphocyte: 64.6 %
WBC: 21.3 10*3/uL — ABNORMAL HIGH (ref 3.8–10.8)

## 2020-05-08 LAB — COMPLETE METABOLIC PANEL WITH GFR
AG Ratio: 2.2 (calc) (ref 1.0–2.5)
ALT: 11 U/L (ref 6–29)
AST: 14 U/L (ref 10–35)
Albumin: 4.2 g/dL (ref 3.6–5.1)
Alkaline phosphatase (APISO): 59 U/L (ref 37–153)
BUN/Creatinine Ratio: 19 (calc) (ref 6–22)
BUN: 21 mg/dL (ref 7–25)
CO2: 23 mmol/L (ref 20–32)
Calcium: 9.3 mg/dL (ref 8.6–10.4)
Chloride: 104 mmol/L (ref 98–110)
Creat: 1.13 mg/dL — ABNORMAL HIGH (ref 0.60–0.93)
GFR, Est African American: 54 mL/min/{1.73_m2} — ABNORMAL LOW (ref >=60)
GFR, Est Non African American: 46 mL/min/{1.73_m2} — ABNORMAL LOW (ref >=60)
Globulin: 1.9 g/dL (calc) (ref 1.9–3.7)
Glucose, Bld: 74 mg/dL (ref 65–99)
Potassium: 4.3 mmol/L (ref 3.5–5.3)
Sodium: 138 mmol/L (ref 135–146)
Total Bilirubin: 1.9 mg/dL — ABNORMAL HIGH (ref 0.2–1.2)
Total Protein: 6.1 g/dL (ref 6.1–8.1)

## 2020-05-20 ENCOUNTER — Other Ambulatory Visit: Payer: Self-pay | Admitting: Oncology

## 2020-05-21 MED FILL — IMBRUVICA 560 MG TAB: 560 | 28 days supply | Qty: 28 | Fill #0

## 2020-06-10 ENCOUNTER — Other Ambulatory Visit: Payer: Self-pay

## 2020-06-10 ENCOUNTER — Inpatient Hospital Stay: Payer: Medicare Other | Attending: Oncology

## 2020-06-10 ENCOUNTER — Inpatient Hospital Stay: Payer: Medicare Other | Admitting: Oncology

## 2020-06-10 VITALS — BP 152/61 | HR 65 | Temp 97.9°F | Resp 16 | Ht 59.0 in | Wt 146.2 lb

## 2020-06-10 DIAGNOSIS — D649 Anemia, unspecified: Secondary | ICD-10-CM

## 2020-06-10 DIAGNOSIS — D591 Autoimmune hemolytic anemia, unspecified: Secondary | ICD-10-CM | POA: Insufficient documentation

## 2020-06-10 DIAGNOSIS — C8310 Mantle cell lymphoma, unspecified site: Secondary | ICD-10-CM

## 2020-06-10 DIAGNOSIS — Z79899 Other long term (current) drug therapy: Secondary | ICD-10-CM | POA: Diagnosis not present

## 2020-06-10 LAB — CMP (CANCER CENTER ONLY)
ALT: 20 U/L (ref 0–44)
AST: 19 U/L (ref 15–41)
Albumin: 4.6 g/dL (ref 3.5–5.0)
Alkaline Phosphatase: 63 U/L (ref 38–126)
Anion gap: 14 (ref 5–15)
BUN: 24 mg/dL — ABNORMAL HIGH (ref 8–23)
CO2: 24 mmol/L (ref 22–32)
Calcium: 10 mg/dL (ref 8.9–10.3)
Chloride: 103 mmol/L (ref 98–111)
Creatinine: 1.24 mg/dL — ABNORMAL HIGH (ref 0.44–1.00)
GFR, Estimated: 44 mL/min — ABNORMAL LOW (ref >60)
Glucose, Bld: 76 mg/dL (ref 70–99)
Potassium: 4 mmol/L (ref 3.5–5.1)
Sodium: 141 mmol/L (ref 135–145)
Total Bilirubin: 1.6 mg/dL — ABNORMAL HIGH (ref 0.3–1.2)
Total Protein: 7.4 g/dL (ref 6.5–8.1)

## 2020-06-10 LAB — CBC WITH DIFFERENTIAL (CANCER CENTER ONLY)
Abs Immature Granulocytes: 0.02 10*3/uL (ref 0.00–0.07)
Basophils Absolute: 0.1 10*3/uL (ref 0.0–0.1)
Basophils Relative: 1 %
Eosinophils Absolute: 0 10*3/uL (ref 0.0–0.5)
Eosinophils Relative: 0 %
HCT: 40.2 % (ref 36.0–46.0)
Hemoglobin: 13.5 g/dL (ref 12.0–15.0)
Immature Granulocytes: 0 %
Lymphocytes Relative: 65 %
Lymphs Abs: 5.6 10*3/uL — ABNORMAL HIGH (ref 0.7–4.0)
MCH: 32.1 pg (ref 26.0–34.0)
MCHC: 33.6 g/dL (ref 30.0–36.0)
MCV: 95.7 fL (ref 80.0–100.0)
Monocytes Absolute: 0.6 10*3/uL (ref 0.1–1.0)
Monocytes Relative: 7 %
Neutro Abs: 2.3 10*3/uL (ref 1.7–7.7)
Neutrophils Relative %: 27 %
Platelet Count: 120 10*3/uL — ABNORMAL LOW (ref 150–400)
RBC: 4.2 MIL/uL (ref 3.87–5.11)
RDW: 12.5 % (ref 11.5–15.5)
WBC Count: 8.6 10*3/uL (ref 4.0–10.5)
nRBC: 0 % (ref 0.0–0.2)

## 2020-06-10 LAB — SAMPLE TO BLOOD BANK

## 2020-06-10 NOTE — Progress Notes (Signed)
Hematology and Oncology Follow Up   Jacqueline Hancock [id number] 02-Jul-1940 80 y.o. 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   Prior Therapy:  She is status post bone marrow biopsy completed on March 05, 2020.  Prednisone 60 mg daily in the January 2022 to treat her hemolytic anemia.   Current therapy: Imbruvica 560 mg started on April 28, 2020.  Interim History: Jacqueline Hancock is here for repeat evaluation.  Since the last visit, she has been taking Imbruvica without any major complications.  She denies any nausea vomiting or abdominal pain.  She denies any bruising or bleeding complications.  She denies any palpitation or fatigue.  Performance status activity level remained excellent.     Medications: Updated on review. Current Outpatient Medications  Medication Sig Dispense Refill  . amLODipine (NORVASC) 5 MG tablet Take 1 tablet (5 mg total) by mouth daily. 90 tablet 3  . Ascorbic Acid (VITAMIN C) 1000 MG tablet Take 1,000 mg by mouth daily.    . calcium carbonate (OSCAL) 1500 (600 Ca) MG TABS tablet Take 500 mg by mouth daily.    . Cholecalciferol (VITAMIN D) 2000 UNITS tablet Take 4,000 Units by mouth daily.     . Ferrous Sulfate (IRON) 325 (65 Fe) MG TABS Take 325 mg by mouth daily.     . IMBRUVICA 560 MG TABS TAKE 1 TABLET (560 MG) BY MOUTH DAILY. 30 tablet 0   No current facility-administered medications for this visit.     Allergies:  Allergies  Allergen Reactions  . Nexium [Esomeprazole Magnesium] Other (See Comments)    Headache   Physical exam  Blood pressure (!) 152/61, pulse 65, temperature 97.9 F (36.6 C), temperature source Tympanic, resp. rate 16, height 4' 11"  (1.499 m), weight 146 lb 3.2 oz (66.3 kg), SpO2 100 %.   ECOG  1    General appearance: Alert, awake without any distress. Head: Atraumatic without abnormalities Oropharynx: Without any thrush or ulcers. Eyes: No scleral icterus. Lymph nodes: No lymphadenopathy noted in the cervical, supraclavicular, or axillary nodes Heart:regular rate and rhythm, without any murmurs or gallops.   Lung: Clear to auscultation without any rhonchi, wheezes or dullness to percussion. Abdomin: Soft, nontender without any shifting dullness or ascites. Musculoskeletal: No clubbing or cyanosis. Neurological: No motor or sensory deficits. Skin: No rashes or lesions.      Lab Results: Lab Results  Component Value Date   WBC 21.3 (H) 05/07/2020   HGB 9.9 (L) 05/07/2020   HCT 28.2 (L) 05/07/2020   MCV 105.2 (H) 05/07/2020   PLT 123 (L) 05/07/2020     Chemistry      Component Value Date/Time   NA 138 05/07/2020 1159   K 4.3 05/07/2020 1159   CL 104 05/07/2020 1159   CO2 23 05/07/2020 1159   BUN 21 05/07/2020 1159   CREATININE 1.13 (H) 05/07/2020 1159      Component Value Date/Time   CALCIUM 9.3 05/07/2020 1159   ALKPHOS 83 04/13/2020 0930   AST 14 05/07/2020 1159   AST 16 04/13/2020 0930   ALT 11 05/07/2020 1159   ALT 29 04/13/2020 0930   BILITOT 1.9 (H) 05/07/2020 1159   BILITOT 2.9 (H) 04/13/2020 0930          Impression and Plan:   80 year old  woman with:  1.  Mantle cell lymphoma presented with lymphocytosis and symptomatic anemia in December 2021.     She is currently on Imbruvica started in January 2022 with reasonable response.  Laboratory testing on February 3 showed mild decrease in her white cell count and improvement in her hemoglobin and platelets.  Risks and benefits of continuing this treatment were discussed at this time.  Complications including bleeding, palpitation, cardiac arrhythmia among others.  Alternative treatment options including systemic chemotherapy were reviewed.  And that will be deferred unless she developed  relapsed disease.  Laboratory data from today reviewed and showed a complete hematological response at this time.  Her white cell count has normalized with hemoglobin is also reverted to normal range.  Plan is to continue with Imbruvica with the same dose and schedule and will assess her in 2 months.  2.  Autoimmune hemolytic anemia: She was treated with prednisone with improvement in her hemoglobin.  Her hemoglobin today has normalized and no signs or symptoms of hemolytic anemia.   3.  Follow-up: In 8 weeks for repeat follow-up.  30  minutes were spent on this visit.  Time was dedicated to reviewing laboratory data, treatment options and outlining future plan of care.   Jacqueline Button, MD 06/10/2020 9:30 AM

## 2020-06-17 ENCOUNTER — Other Ambulatory Visit: Payer: Self-pay | Admitting: Oncology

## 2020-06-24 ENCOUNTER — Ambulatory Visit: Payer: Medicare Other | Admitting: Adult Health Nurse Practitioner

## 2020-07-02 ENCOUNTER — Other Ambulatory Visit (HOSPITAL_COMMUNITY): Payer: Self-pay

## 2020-07-13 ENCOUNTER — Other Ambulatory Visit (HOSPITAL_COMMUNITY): Payer: Self-pay

## 2020-07-13 ENCOUNTER — Other Ambulatory Visit: Payer: Self-pay | Admitting: Oncology

## 2020-07-13 MED ORDER — IMBRUVICA 560 MG PO TABS
ORAL_TABLET | ORAL | 0 refills | Status: DC
  Filled 2020-07-13: qty 28, fill #0
  Filled 2020-07-13: qty 28, 28d supply, fill #0

## 2020-08-05 ENCOUNTER — Ambulatory Visit: Payer: Medicare Other | Admitting: Adult Health Nurse Practitioner

## 2020-08-06 DIAGNOSIS — E669 Obesity, unspecified: Secondary | ICD-10-CM | POA: Insufficient documentation

## 2020-08-06 DIAGNOSIS — D5919 Other autoimmune hemolytic anemia: Secondary | ICD-10-CM | POA: Insufficient documentation

## 2020-08-06 DIAGNOSIS — Z6829 Body mass index (BMI) 29.0-29.9, adult: Secondary | ICD-10-CM | POA: Insufficient documentation

## 2020-08-06 NOTE — Progress Notes (Signed)
3 MONTH FOLLOW UP  Assessment / Plan    Essential hypertension Continue current medications Monitor blood pressure at home; call if consistently over 130/80 Continue DASH diet.   Reminder to go to the ER if any CP, SOB, nausea, dizziness, severe HA, changes vision/speech, left arm numbness and tingling and jaw pain. -     CBC with Differential/Platelet -     COMPLETE METABOLIC PANEL WITH GFR -     amLODipine (NORVASC) 5 MG tablet; Take 1 tablet (5 mg total) by mouth daily.  Hyperlipidemia, unspecified hyperlipidemia type Controlled with diet and exercise Discussed dietary and exercise modifications Low fat diet -     Lipid panel -     TSH  Vitamin D deficiency Continue supplementation to maintain goal of 70-100 Taking Vitamin D 5,000 IU daily -     VITAMIN D 25 Hydroxy (Vit-D Deficiency, Fractures)  Stage 3 chronic kidney disease, 3a (HCC) Increase fluids, avoid NSAIDS, monitor sugars, will monitor - CMP/GFR  Abnormal glucose Discussed dietary and exercise modifications  Mantle Cell Lymphoma [facility]) Hematology following, doing well with imbruvica Monitor; CBC  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 Suggested symptomatic OTC remedies. Nasal saline spray for congestion. Nasal steroids Try allegra instead of claritin Cough syrup sent in  Follow up as needed if any worse or not improving  Over 30 minutes of face to face interview, exam, counseling, chart review, and critical decision making was performed.  Future  Appointments  Date Time Provider [place]  08/11/2020  3:00 PM [facility] None  08/11/2020  3:30 PM Wyatt Portela, MD [facility] None  12/28/2020 10:00 AM Liane Comber, NP [facility] None     Subjective:   Jacqueline Hancock is a 80 y.o.  female who presents for 3 month follow up on HTN, HLD, prediabetes/abnormal glucose, vitamin D def.   She was referred to hematology Dr. Alen Blew in Dec 2021 for anemia and lymphocytosis, underwent bone marrow biopsy 03/05/2020 and was diagnosed with mantle cell lymphoma and secondary autoimmune hemolytic anemia, underwent prednisone course with good results and now on imbruvica 560 mg which is tolerated well.   She reports 2-3 weeks of itchy eyes, ears itching, post nasal drip with intermittent cough, denies fever/chills, dyspnea, CP. She has been taking claritin daily, helped some but persistent.   BMI is Body mass index is 30.3 kg/m., she admits is not working on diet and exercise, intermittent, recently minimal exercise, does have silver sneakers.  Wt Readings from Last 3 Encounters:  08/07/20 150 lb (68 kg)  06/10/20 146 lb 3.2 oz (66.3 kg)  05/07/20 144 lb (65.3 kg)   Her blood pressure has been controlled at home, today their BP is BP: 132/72 She does not workout. She denies chest pain, shortness of breath, dizziness.   She is not on cholesterol medication and denies myalgias. Her cholesterol is at goal. The cholesterol last visit was:   Lab Results  Component Value Date   CHOL 96 12/24/2019   HDL 28 (L) 12/24/2019   LDLCALC 49 12/24/2019   TRIG 104 12/24/2019   CHOLHDL 3.4 12/24/2019  She has been working on diet and exercise for glucose management, and denies paresthesia of the feet, polydipsia, polyuria and visual disturbances. Last A1C in the office was:  Lab Results  Component Value Date   HGBA1C 4.5 12/24/2019   She has CKD IIIa monitored at this office, drinking 3 bottles, denies NSAID use, last GFR:  Lab  Results  Component Value Date   GFRNONAA 44 (L) 06/10/2020   GFRNONAA 46 (L) 05/07/2020   GFRNONAA 48 (L) 04/13/2020   Patient is on Vitamin D supplement. Lab Results  Component Value Date   VD25OH 92 12/24/2019       Medication Review Current Outpatient Medications on File Prior to Visit  Medication Sig Dispense Refill  . amLODipine (NORVASC) 5 MG tablet Take 1 tablet (5 mg total) by mouth daily. 90 tablet 3  . Ascorbic Acid (VITAMIN C) 1000 MG tablet Take 1,000 mg by mouth daily.    . calcium carbonate (OSCAL) 1500 (600 Ca) MG TABS tablet Take 500 mg by mouth daily.    . Cholecalciferol (VITAMIN D) 2000 UNITS tablet Take 4,000 Units by mouth daily.     . Ferrous Sulfate (IRON) 325 (65 Fe) MG TABS Take 325 mg by mouth daily.     . [DISCONTINUED] gabapentin (NEURONTIN) 100 MG capsule Take 1 capsule 3 x /day for Neuropathy Pain (Patient not taking: Reported on 01/25/2020) 270 capsule 0   No current facility-administered medications on file prior to visit.    Current Problems (verified) Patient Active Problem List   Diagnosis Date Noted  . 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 BMI 29.0-29.9,adult 08/06/2020  . Mantle cell lymphoma ([place]) 01/25/2020  . CKD (chronic kidney disease) stage 3, GFR 30-59 ml/min (HCC) 09/10/2015  . Vitamin D deficiency 06/20/2013  . Medication management 06/20/2013  . Hypertension   . Hyperlipidemia   . Abnormal glucose   . GERD (gastroesophageal reflux disease)   . Osteopenia     Allergies Allergies  Allergen Reactions  . Nexium [Esomeprazole Magnesium] Other (See Comments)    Headache    SURGICAL HISTORY She  has a past surgical history that includes Parotid gland tumor excision (2012); Abdominal hysterectomy; Appendectomy; Tonsillectomy and adenoidectomy; and Cataract extraction, bilateral (Bilateral, 09/2017). FAMILY HISTORY Her family history includes Diabetes in her brother,  brother, and sister; Heart disease in her brother; Hypertension in her father; Leukemia in her mother. SOCIAL HISTORY She  reports that she has never smoked. She has never used smokeless tobacco. She reports that she does not drink alcohol and does not use drugs.  Review of Systems  Constitutional: Negative for malaise/fatigue and weight loss.  HENT: Positive for congestion. Negative for hearing loss, sinus pain, sore throat and tinnitus.   Eyes: Negative for blurred vision and double vision.  Respiratory: Positive for cough and sputum production (mild intermittent). Negative for shortness of breath and wheezing.   Cardiovascular: Negative for chest pain, palpitations, orthopnea, claudication and leg swelling.  Gastrointestinal: Negative for abdominal pain, blood in stool, constipation, diarrhea, heartburn, melena, nausea and vomiting.  Genitourinary: Negative.   Musculoskeletal: Negative for joint pain and myalgias.  Skin: Negative for rash.  Neurological: Negative for dizziness, tingling, sensory change, weakness and headaches.  Endo/Heme/Allergies: Positive for environmental allergies. Negative for polydipsia.  Psychiatric/Behavioral: Negative.   All other systems reviewed and are negative.   Objective:   Today's Vitals   08/07/20 1127  BP: 132/72  Pulse: 68  Temp: 97.7 F (36.5  C)  SpO2: 97%  Weight: 150 lb (68 kg)  Height: 4' 11"  (1.499 m)   Body mass index is 30.3 kg/m.  General appearance: alert, no distress, WD/WN,  female HEENT: normocephalic, sclerae anicteric, TMs pearly, nares patent, no discharge or erythema, pharynx normal Oral cavity: MMM, no lesions Neck: supple, no lymphadenopathy, no thyromegaly, no masses Heart: RRR, normal S1, S2, no murmurs Lungs: CTA bilaterally, no wheezes, rhonchi, or rales Abdomen: +bs, soft, non-tender, non distended, no masses, no hepatomegaly, no splenomegaly Musculoskeletal: nontender, no swelling, no obvious  deformity Extremities: no edema, no cyanosis, no clubbing Pulses: 2+ symmetric, upper and lower extremities, normal cap refill Neurological: alert, oriented x 3, CN2-12 intact, strength normal upper extremities and lower extremities, sensation normal throughout, DTRs 2+ throughout, no cerebellar signs, gait normal Psychiatric: normal affect, behavior normal, pleasant    Izora Ribas, NP   05/07/2020

## 2020-08-07 ENCOUNTER — Other Ambulatory Visit: Payer: Self-pay

## 2020-08-07 ENCOUNTER — Ambulatory Visit (INDEPENDENT_AMBULATORY_CARE_PROVIDER_SITE_OTHER): Payer: Medicare Other | Admitting: Adult Health

## 2020-08-07 ENCOUNTER — Other Ambulatory Visit: Payer: Self-pay | Admitting: Oncology

## 2020-08-07 ENCOUNTER — Other Ambulatory Visit (HOSPITAL_COMMUNITY): Payer: Self-pay

## 2020-08-07 ENCOUNTER — Encounter: Payer: Self-pay | Admitting: Adult Health

## 2020-08-07 VITALS — BP 132/72 | HR 68 | Temp 97.7°F | Ht 59.0 in | Wt 150.0 lb

## 2020-08-07 DIAGNOSIS — R7309 Other abnormal glucose: Secondary | ICD-10-CM | POA: Diagnosis not present

## 2020-08-07 DIAGNOSIS — C8310 Mantle cell lymphoma, unspecified site: Secondary | ICD-10-CM

## 2020-08-07 DIAGNOSIS — E559 Vitamin D deficiency, unspecified: Secondary | ICD-10-CM | POA: Diagnosis not present

## 2020-08-07 DIAGNOSIS — D5919 Other autoimmune hemolytic anemia: Secondary | ICD-10-CM

## 2020-08-07 DIAGNOSIS — E785 Hyperlipidemia, unspecified: Secondary | ICD-10-CM

## 2020-08-07 DIAGNOSIS — E669 Obesity, unspecified: Secondary | ICD-10-CM

## 2020-08-07 DIAGNOSIS — I159 Secondary hypertension, unspecified: Secondary | ICD-10-CM

## 2020-08-07 DIAGNOSIS — N1831 Chronic kidney disease, stage 3a: Secondary | ICD-10-CM | POA: Diagnosis not present

## 2020-08-07 DIAGNOSIS — Z79899 Other long term (current) drug therapy: Secondary | ICD-10-CM

## 2020-08-07 DIAGNOSIS — Z6829 Body mass index (BMI) 29.0-29.9, adult: Secondary | ICD-10-CM

## 2020-08-07 MED ORDER — IMBRUVICA 560 MG PO TABS
ORAL_TABLET | ORAL | 0 refills | Status: DC
  Filled 2020-08-07: qty 28, 28d supply, fill #0

## 2020-08-07 MED ORDER — PROMETHAZINE-DM 6.25-15 MG/5ML PO SYRP: 5.0000 mL | ORAL_SOLUTION | Freq: Four times a day (QID) | ORAL | 1 refills | Status: DC | PRN

## 2020-08-07 NOTE — Patient Instructions (Addendum)
    Try allegra (fexofenadine 180 mg once daily) instead of claritin   Cough syrup sent in   Mucinex (guaifenacin) at night to thin secretions   Hot steamy shower or breathe steam to help thin secretions     Medicines you can use  Nasal congestion  Little Remedies saline spray (aerosol/mist)- can try this, it is in the kids section - pseudoephedrine (Sudafed)- behind the counter, do not use if you have high blood pressure, medicine that have -D in them.  - phenylephrine (Sudafed PE) -Dextormethorphan + chlorpheniramine (Coridcidin HBP)- okay if you have high blood pressure -Oxymetazoline (Afrin) nasal spray- LIMIT to 3 days -Saline nasal spray -Neti pot (used distilled or bottled water)  Ear pain/congestion  -pseudoephedrine (sudafed) - Nasonex/flonase nasal spray  Fever  -Acetaminophen (Tyelnol) -Ibuprofen (Advil, motrin, aleve)  Sore Throat  -Acetaminophen (Tyelnol) -Ibuprofen (Advil, motrin, aleve) -Drink a lot of water -Gargle with salt water - Rest your voice (don't talk) -Throat sprays -Cough drops  Body Aches  -Acetaminophen (Tyelnol) -Ibuprofen (Advil, motrin, aleve)  Headache  -Acetaminophen (Tyelnol) -Ibuprofen (Advil, motrin, aleve) - Exedrin, Exedrin Migraine  Allergy symptoms (cough, sneeze, runny nose, itchy eyes) -Claritin or loratadine cheapest but likely the weakest  -Zyrtec or certizine at night because it can make you sleepy -The strongest is allegra or fexafinadine  Cheapest at [employer], [employer], [employer]  Cough  -Dextromethorphan (Delsym)- medicine that has DM in it -Guafenesin (Mucinex/Robitussin) - cough drops - drink lots of water  Chest Congestion  -Guafenesin (Mucinex/Robitussin)  Red Itchy Eyes  - Naphcon-A  Upset Stomach  - Bland diet (nothing spicy, greasy, fried, and high acid foods like tomatoes, oranges, berries) -OKAY- cereal, bread, soup, crackers, rice -Eat smaller more frequent meals -reduce caffeine, no  alcohol -Loperamide (Imodium-AD) if diarrhea -Prevacid for heart burn  General health when sick  -Hydration -wash your hands frequently -keep surfaces clean -change pillow cases and sheets often -Get fresh air but do not exercise strenuously -Vitamin D, double up on it - Vitamin C -Zinc

## 2020-08-08 LAB — COMPLETE METABOLIC PANEL WITH GFR
AG Ratio: 2.4 (calc) (ref 1.0–2.5)
ALT: 27 U/L (ref 6–29)
AST: 30 U/L (ref 10–35)
Albumin: 4.5 g/dL (ref 3.6–5.1)
Alkaline phosphatase (APISO): 65 U/L (ref 37–153)
BUN/Creatinine Ratio: 20 (calc) (ref 6–22)
BUN: 23 mg/dL (ref 7–25)
CO2: 25 mmol/L (ref 20–32)
Calcium: 9.9 mg/dL (ref 8.6–10.4)
Chloride: 102 mmol/L (ref 98–110)
Creat: 1.15 mg/dL — ABNORMAL HIGH (ref 0.60–0.88)
GFR, Est African American: 52 mL/min/{1.73_m2} — ABNORMAL LOW (ref >=60)
GFR, Est Non African American: 45 mL/min/{1.73_m2} — ABNORMAL LOW (ref >=60)
Globulin: 1.9 g/dL (calc) (ref 1.9–3.7)
Glucose, Bld: 81 mg/dL (ref 65–99)
Potassium: 4.2 mmol/L (ref 3.5–5.3)
Sodium: 137 mmol/L (ref 135–146)
Total Bilirubin: 1.4 mg/dL — ABNORMAL HIGH (ref 0.2–1.2)
Total Protein: 6.4 g/dL (ref 6.1–8.1)

## 2020-08-08 LAB — CBC WITH DIFFERENTIAL/PLATELET
Absolute Monocytes: 906 cells/uL (ref 200–950)
Basophils Absolute: 32 cells/uL (ref 0–200)
Basophils Relative: 0.7 %
Eosinophils Absolute: 9 cells/uL — ABNORMAL LOW (ref 15–500)
Eosinophils Relative: 0.2 %
HCT: 45.1 % — ABNORMAL HIGH (ref 35.0–45.0)
Hemoglobin: 15.2 g/dL (ref 11.7–15.5)
Lymphs Abs: 2042 cells/uL (ref 850–3900)
MCH: 29.3 pg (ref 27.0–33.0)
MCHC: 33.7 g/dL (ref 32.0–36.0)
MCV: 86.9 fL (ref 80.0–100.0)
MPV: 13.8 fL — ABNORMAL HIGH (ref 7.5–12.5)
Monocytes Relative: 19.7 %
Neutro Abs: 1610 cells/uL (ref 1500–7800)
Neutrophils Relative %: 35 %
Platelets: 111 10*3/uL — ABNORMAL LOW (ref 140–400)
RBC: 5.19 10*6/uL — ABNORMAL HIGH (ref 3.80–5.10)
RDW: 11.9 % (ref 11.0–15.0)
Total Lymphocyte: 44.4 %
WBC: 4.6 10*3/uL (ref 3.8–10.8)

## 2020-08-08 LAB — LIPID PANEL
Cholesterol: 209 mg/dL — ABNORMAL HIGH (ref <200)
HDL: 53 mg/dL (ref >=50)
LDL Cholesterol (Calc): 126 mg/dL (calc) — ABNORMAL HIGH
Non-HDL Cholesterol (Calc): 156 mg/dL (calc) — ABNORMAL HIGH (ref <130)
Total CHOL/HDL Ratio: 3.9 (calc) (ref <5.0)
Triglycerides: 181 mg/dL — ABNORMAL HIGH (ref <150)

## 2020-08-08 LAB — TSH: TSH: 3.43 mIU/L (ref 0.40–4.50)

## 2020-08-08 LAB — VITAMIN D 25 HYDROXY (VIT D DEFICIENCY, FRACTURES): Vit D, 25-Hydroxy: 56 ng/mL (ref 30–100)

## 2020-08-08 LAB — MAGNESIUM: Magnesium: 2.3 mg/dL (ref 1.5–2.5)

## 2020-08-11 ENCOUNTER — Inpatient Hospital Stay: Payer: Medicare Other | Attending: Oncology

## 2020-08-11 ENCOUNTER — Other Ambulatory Visit: Payer: Self-pay

## 2020-08-11 ENCOUNTER — Inpatient Hospital Stay: Payer: Medicare Other | Admitting: Oncology

## 2020-08-11 VITALS — BP 159/67 | HR 82 | Temp 98.4°F | Resp 18 | Wt 150.3 lb

## 2020-08-11 DIAGNOSIS — Z79899 Other long term (current) drug therapy: Secondary | ICD-10-CM | POA: Insufficient documentation

## 2020-08-11 DIAGNOSIS — D591 Autoimmune hemolytic anemia, unspecified: Secondary | ICD-10-CM | POA: Insufficient documentation

## 2020-08-11 DIAGNOSIS — D649 Anemia, unspecified: Secondary | ICD-10-CM | POA: Diagnosis not present

## 2020-08-11 DIAGNOSIS — D72820 Lymphocytosis (symptomatic): Secondary | ICD-10-CM | POA: Diagnosis not present

## 2020-08-11 DIAGNOSIS — C8310 Mantle cell lymphoma, unspecified site: Secondary | ICD-10-CM

## 2020-08-11 LAB — CMP (CANCER CENTER ONLY)
ALT: 30 U/L (ref 0–44)
AST: 39 U/L (ref 15–41)
Albumin: 4.1 g/dL (ref 3.5–5.0)
Alkaline Phosphatase: 77 U/L (ref 38–126)
Anion gap: 11 (ref 5–15)
BUN: 22 mg/dL (ref 8–23)
CO2: 23 mmol/L (ref 22–32)
Calcium: 9.5 mg/dL (ref 8.9–10.3)
Chloride: 101 mmol/L (ref 98–111)
Creatinine: 1.35 mg/dL — ABNORMAL HIGH (ref 0.44–1.00)
GFR, Estimated: 40 mL/min — ABNORMAL LOW (ref >60)
Glucose, Bld: 106 mg/dL — ABNORMAL HIGH (ref 70–99)
Potassium: 4.3 mmol/L (ref 3.5–5.1)
Sodium: 135 mmol/L (ref 135–145)
Total Bilirubin: 1.6 mg/dL — ABNORMAL HIGH (ref 0.3–1.2)
Total Protein: 6.7 g/dL (ref 6.5–8.1)

## 2020-08-11 LAB — CBC WITH DIFFERENTIAL (CANCER CENTER ONLY)
Abs Immature Granulocytes: 0.03 10*3/uL (ref 0.00–0.07)
Basophils Absolute: 0.1 10*3/uL (ref 0.0–0.1)
Basophils Relative: 1 %
Eosinophils Absolute: 0 10*3/uL (ref 0.0–0.5)
Eosinophils Relative: 1 %
HCT: 46.7 % — ABNORMAL HIGH (ref 36.0–46.0)
Hemoglobin: 15.6 g/dL — ABNORMAL HIGH (ref 12.0–15.0)
Immature Granulocytes: 1 %
Lymphocytes Relative: 47 %
Lymphs Abs: 2.4 10*3/uL (ref 0.7–4.0)
MCH: 29.4 pg (ref 26.0–34.0)
MCHC: 33.4 g/dL (ref 30.0–36.0)
MCV: 88.1 fL (ref 80.0–100.0)
Monocytes Absolute: 1 10*3/uL (ref 0.1–1.0)
Monocytes Relative: 20 %
Neutro Abs: 1.5 10*3/uL — ABNORMAL LOW (ref 1.7–7.7)
Neutrophils Relative %: 30 %
Platelet Count: 121 10*3/uL — ABNORMAL LOW (ref 150–400)
RBC: 5.3 MIL/uL — ABNORMAL HIGH (ref 3.87–5.11)
RDW: 12.3 % (ref 11.5–15.5)
WBC Count: 4.9 10*3/uL (ref 4.0–10.5)
nRBC: 0 % (ref 0.0–0.2)

## 2020-08-11 LAB — SAMPLE TO BLOOD BANK

## 2020-08-11 NOTE — Progress Notes (Signed)
Hematology and Oncology Follow Up   Jacqueline Hancock [id number] 1941/02/16 80 y.o. 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  She presented with lymphocytosis and anemia.   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   Prior Therapy:  She is status post bone marrow biopsy completed on March 05, 2020.   Prednisone 60 mg daily in the January 2022 to treat her hemolytic anemia.  This was tapered off after complete hematological response.   Current therapy: Imbruvica 560 mg started on April 28, 2020.  Interim History: [name] is here for a follow-up visit.  Since the last visit, she reports no major changes in her health.  She has tolerated Imbruvica without any new complaints.  She denies any nausea, vomiting or abdominal pain.  She denies any recent hospitalization or illnesses.  She denies any bruising or bleeding complications.  She denies any palpitation or excessive fatigue.  Her performance status quality of life remained excellent.     Medications: Unchanged on review. Current Outpatient Medications  Medication Sig Dispense Refill  . amLODipine (NORVASC) 5 MG tablet Take 1 tablet (5 mg total) by mouth daily. 90 tablet 3  . Ascorbic Acid (VITAMIN C) 1000 MG tablet Take 1,000 mg by mouth daily.    . calcium carbonate (OSCAL) 1500 (600 Ca) MG TABS tablet Take 500 mg by mouth daily.    . Cholecalciferol (VITAMIN D) 2000 UNITS tablet Take 4,000 Units by mouth daily.     . Ferrous Sulfate (IRON) 325 (65 Fe) MG TABS Take 325 mg by mouth daily.     Marland Kitchen ibrutinib (IMBRUVICA) 560 MG TABS TAKE 1 TABLET (560 MG) BY MOUTH DAILY. 28 tablet 0  . promethazine-dextromethorphan (PROMETHAZINE-DM) 6.25-15 MG/5ML syrup Take 5 mLs by mouth 4 (four) times daily as needed for cough. 240 mL 1   No current facility-administered medications for this visit.      Allergies:  Allergies  Allergen Reactions  . Nexium [Esomeprazole Magnesium] Other (See Comments)    Headache   Physical exam  Blood pressure (!) 159/67, pulse 82, temperature 98.4 F (36.9 C), temperature source Tympanic, resp. rate 18, weight 150 lb 4.8 oz (68.2 kg), SpO2 95 %.    ECOG 1    General appearance: Comfortable appearing without any discomfort Head: Normocephalic without any trauma Oropharynx: Mucous membranes are moist and pink without any thrush or ulcers. Eyes: Pupils are equal and round reactive to light. Lymph nodes: No cervical, supraclavicular, inguinal or axillary lymphadenopathy.   Heart:regular rate and rhythm.  S1 and S2 without leg edema. Lung: Clear without any rhonchi or wheezes.  No dullness to percussion. Abdomin: Soft, nontender, nondistended with good bowel sounds.  No hepatosplenomegaly. Musculoskeletal: No joint deformity or effusion.  Full range of motion noted. Neurological: No deficits noted on motor, sensory and deep tendon reflex exam. Skin: No petechial rash or dryness.  Appeared moist.        Lab Results: Lab Results  Component Value Date   WBC 4.6 08/07/2020   HGB 15.2 08/07/2020   HCT 45.1 (H) 08/07/2020   MCV 86.9 08/07/2020   PLT 111 (L) 08/07/2020     Chemistry      Component Value Date/Time   NA 137 08/07/2020 1115   K 4.2 08/07/2020 1115   CL 102 08/07/2020 1115   CO2 25 08/07/2020 1115   BUN 23  08/07/2020 1115   CREATININE 1.15 (H) 08/07/2020 1115      Component Value Date/Time   CALCIUM 9.9 08/07/2020 1115   ALKPHOS 63 06/10/2020 0939   AST 30 08/07/2020 1115   AST 19 06/10/2020 0939   ALT 27 08/07/2020 1115   ALT 20 06/10/2020 0939   BILITOT 1.4 (H) 08/07/2020 1115   BILITOT 1.6 (H) 06/10/2020 0939          Impression and Plan:   80-year-old woman with:  1.  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  She was found to have presented with lymphocytosis and autoimmune hemolytic  anemia.   Laboratory data on Aug 07, 2020 showed complete normalization of her white cell count without any lymphocytosis.  Risks and benefits of continuing Imbruvica were discussed.  Complications that include palpitation, arrhythmia and bleeding were reviewed.  She is agreeable to continue at this time.  Laboratory data from today also reviewed and showed a complete hematological response.   2.  Autoimmune hemolytic anemia: Resolved at this time with hemoglobin is back to normal range.   3.  Follow-up: In 3 months for repeat follow-up.  30  minutes were dedicated to this encounter.  Time was spent on reviewing disease status, discussing treatment options and outlining future plan of care.   Firas Shadad, MD 08/11/2020 2:41 PM 

## 2020-08-12 ENCOUNTER — Other Ambulatory Visit (HOSPITAL_COMMUNITY): Payer: Self-pay

## 2020-09-07 ENCOUNTER — Other Ambulatory Visit: Payer: Self-pay | Admitting: Oncology

## 2020-09-07 ENCOUNTER — Other Ambulatory Visit (HOSPITAL_COMMUNITY): Payer: Self-pay

## 2020-09-07 MED ORDER — IMBRUVICA 560 MG PO TABS
ORAL_TABLET | ORAL | 0 refills | Status: DC
  Filled 2020-09-07: qty 28, 28d supply, fill #0

## 2020-09-08 ENCOUNTER — Telehealth: Payer: Self-pay

## 2020-09-08 NOTE — Telephone Encounter (Signed)
Returned call to pt per her request. Pt states over the last 2 weeks she has had lower leg swelling. Legs are not red or hot to touch . Pt states calves on somewhat sore on both legs. Encouraged pt to drink plenty of fluids and keep legs elevated. Explained to pt that Imbruvica can cause some swelling. RN will also let Dr Alen Blew know about leg edema for his direction as well.

## 2020-09-10 ENCOUNTER — Other Ambulatory Visit (HOSPITAL_COMMUNITY): Payer: Self-pay

## 2020-09-14 ENCOUNTER — Other Ambulatory Visit (HOSPITAL_COMMUNITY): Payer: Self-pay

## 2020-09-21 NOTE — Progress Notes (Signed)
Assessment and Plan:  Jordy was seen today for acute visit.  Diagnoses and all orders for this visit:  Oral thrush -has not had recent ABX use- nystatin mouth wash sent in - follow up if not resolving or recurrent  -     nystatin (MYCOSTATIN) 100000 UNIT/ML suspension; 5 ml four times a day, retain in mouth as long as possible (Swish and Spit).  Use for 48 hours after symptoms resolve.  Edema, unspecified type Low concern CHF Suspect excess sodium and amlodipine contributing Due to CKD will avoid diuretic if possible  See BP notes for med change - elevate legs TID, increase activity, increase water, decrease sodium intake.  Wear compression socks more routinely if available. Follow up in 3-4 weeks or sooner if not improving  Secondary hypertension Initiate medication: losartan 50 mg instead of amlodipine Monitor blood pressure at home; call if consistently over 130/80 Discussed DASH diet Advised to go to the ER if any CP, SOB, nausea, dizziness, severe HA, changes vision/speech, left arm numbness and tingling and jaw pain. Follow up in 3-4  -     losartan (COZAAR) 50 MG tablet; Take 1 tablet (50 mg total) by mouth daily.  Further disposition pending results of labs. Discussed med's effects and SE's.   Over 15 minutes of exam, counseling, chart review, and critical decision making was performed.   Future Appointments  Date Time Provider [place]  11/12/2020  9:00 AM [facility] None  11/12/2020  9:30 AM Wyatt Portela, MD [facility] None  12/28/2020 10:00 AM Liane Comber, NP [facility] None    ------------------------------------------------------------------------------------------------------------------   HPI BP 134/74   Pulse 73   Temp (!) 97.3 F (36.3 C)   Wt 150 lb (68 kg)   SpO2 97%   BMI 30.30 kg/m   80 y.o.female with hx of htn, mantle cell lymphoma presents for evaluation of sore throat x 1 week and edema of legs x 3 weeks  She  reports 1 week ago woke up with a sore throat (dry, irritated), with dry cough. She reports whole mouth felt very sore. She denies HA, fever/chills. She does endorse bil ear pressure. She reports notes white patches in mouth with sores, gargled salt water without benefit, tried throat lozenges without benefit, tried throat spray per pharmacist (? Chloraseptic) and significantly improvement.   She is taking claritin for her allergies has been taking for 2+ years  She notes has been having swelling in her legs for 3 weeks. She denies fatigue, dyspnea, PND, new medications. She has been drinking more broth due to sore throat but otherwise denies new meds/foods. Edema does not resolve in AM. She has been on amlodipine 5 mg daily for many years, does note previously took 10 mg and had similar edema.    Lab Results  Component Value Date   GFRAA 52 (L) 08/07/2020   Her blood pressure has been controlled at home, today their BP is BP: 134/74 She denies chest pain, shortness of breath, dizziness.  She was referred to hematology Dr. Alen Blew in Dec 2021 for anemia and lymphocytosis, underwent bone marrow biopsy 03/05/2020 and was diagnosed with mantle cell lymphoma and secondary autoimmune hemolytic anemia, underwent prednisone course with good results and now on imbruvica 560 mg which is tolerated well for the last 6 months.    Past Medical History:  Diagnosis Date   Dyspnea 2021   with exersion    Early cataracts, bilateral    Elevated hemoglobin A1c  Fibroids    GERD (gastroesophageal reflux disease)    Hyperlipidemia    Hypertension    Osteopenia      Allergies  Allergen Reactions   Nexium [Esomeprazole Magnesium] Other (See Comments)    Headache    Current Outpatient Medications on File Prior to Visit  Medication Sig   Ascorbic Acid (VITAMIN C) 1000 MG tablet Take 1,000 mg by mouth daily.   calcium carbonate (OSCAL) 1500 (600 Ca) MG TABS tablet Take 500 mg by mouth daily.    Cholecalciferol (VITAMIN D) 2000 UNITS tablet Take 4,000 Units by mouth daily.    Ferrous Sulfate (IRON) 325 (65 Fe) MG TABS Take 325 mg by mouth daily.    ibrutinib (IMBRUVICA) 560 MG TABS TAKE 1 TABLET (560 MG) BY MOUTH DAILY.   promethazine-dextromethorphan (PROMETHAZINE-DM) 6.25-15 MG/5ML syrup Take 5 mLs by mouth 4 (four) times daily as needed for cough.   [DISCONTINUED] gabapentin (NEURONTIN) 100 MG capsule Take 1 capsule 3 x /day for Neuropathy Pain (Patient not taking: Reported on 01/25/2020)   No current facility-administered medications on file prior to visit.    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 Surgical History:  She  has a past surgical history that includes Parotid gland tumor excision (2012); Abdominal hysterectomy; Appendectomy; Tonsillectomy and adenoidectomy; and Cataract extraction, bilateral (Bilateral, 09/2017). Family History:  Herfamily history includes Diabetes in her brother, brother, and sister; Heart disease in her brother; Hypertension in her father; Leukemia in her mother. Social History:   reports that she has never smoked. She has never used smokeless tobacco. She reports that she does not drink alcohol and does not use drugs.  ROS: all negative except above.   Physical Exam:  BP 134/74   Pulse 73   Temp (!) 97.3 F (36.3 C)   Wt 150 lb (68 kg)   SpO2 97%   BMI 30.30 kg/m   General Appearance: Well nourished, in no apparent distress. Eyes: PERRLA, conjunctiva no swelling or erythema ENT/Mouth: Ext aud canals clear, TMs without erythema, bulging. Tongue and post pharynx mildly erythematous, mild glossitis  with patchy white on tongue, no swelling on post pharynx.   Hearing normal.  Neck: Supple, thyroid normal.  Respiratory: Respiratory effort normal, BS equal bilaterally without rales, rhonchi, wheezing or stridor.  Cardio: RRR with no MRGs. Brisk peripheral pulses with 2+ pitting edema  Abdomen: Soft, + BS.  Non tender, non-distended Lymphatics: Non tender without lymphadenopathy.  Musculoskeletal: normal gait.  Skin: Warm, dry without rashes, lesions, ecchymosis.  Neuro: Normal muscle tone Psych: Awake and oriented X 3, normal affect, Insight and Judgment appropriate.     Izora Ribas, NP 9:23 AM [facility]

## 2020-09-23 ENCOUNTER — Ambulatory Visit (INDEPENDENT_AMBULATORY_CARE_PROVIDER_SITE_OTHER): Payer: Medicare Other | Admitting: Adult Health

## 2020-09-23 ENCOUNTER — Other Ambulatory Visit: Payer: Self-pay

## 2020-09-23 ENCOUNTER — Other Ambulatory Visit: Payer: Self-pay | Admitting: Adult Health

## 2020-09-23 ENCOUNTER — Encounter: Payer: Self-pay | Admitting: Adult Health

## 2020-09-23 VITALS — BP 134/74 | HR 73 | Temp 97.3°F | Wt 150.0 lb

## 2020-09-23 DIAGNOSIS — I159 Secondary hypertension, unspecified: Secondary | ICD-10-CM

## 2020-09-23 DIAGNOSIS — R609 Edema, unspecified: Secondary | ICD-10-CM | POA: Diagnosis not present

## 2020-09-23 DIAGNOSIS — B370 Candidal stomatitis: Secondary | ICD-10-CM | POA: Diagnosis not present

## 2020-09-23 MED ORDER — LOSARTAN POTASSIUM 50 MG PO TABS: 50.0000 mg | ORAL_TABLET | Freq: Every day | ORAL | 0 refills | Status: DC

## 2020-09-23 MED ORDER — NYSTATIN 100000 UNIT/ML MT SUSP: OROMUCOSAL | 0 refills | Status: DC

## 2020-09-23 NOTE — Patient Instructions (Signed)
Start with 1/2 tab losartan STOP amlodipine Stop broths More water Less salt   Losartan Tablets What is this medication? LOSARTAN (loe SAR tan) treats high blood pressure. It may also be used to prevent a stroke in people with heart disease and high blood pressure. It can be used to prevent kidney damage in people with diabetes. It works by relaxing the blood vessels, which helps decrease the amount of work your heart has todo. It belongs to a group of medications called ARBs. This medicine may be used for other purposes; ask your health care provider orpharmacist if you have questions. COMMON BRAND NAME(S): Cozaar What should I tell my care team before I take this medication? They need to know if you have any of these conditions: Heart failure Kidney disease Liver disease An unusual or allergic reaction to losartan, other medications, foods, dyes, or preservatives Pregnant or trying to get pregnant Breast-feeding How should I use this medication? Take this medication by mouth. Take it as directed on the prescription label at the same time every day. You can take it with or without food. If it upsets your stomach, take it with food. Keep taking it unless your care team tells youto stop. Talk to your care team about the use of this medication in children. While it may be prescribed for children as young as 6 for selected conditions,precautions do apply. Overdosage: If you think you have taken too much of this medicine contact apoison control center or emergency room at once. NOTE: This medicine is only for you. Do not share this medicine with others. What if I miss a dose? If you miss a dose, take it as soon as you can. If it is almost time for yournext dose, take only that dose. Do not take double or extra doses. What may interact with this medication? Aliskiren ACE inhibitors, like enalapril or lisinopril Diuretics, especially amiloride, eplerenone, spironolactone, or  triamterene Lithium NSAIDs, medications for pain and inflammation, like ibuprofen or naproxen Potassium salts or potassium supplements This list may not describe all possible interactions. Give your health care provider a list of all the medicines, herbs, non-prescription drugs, or dietary supplements you use. Also tell them if you smoke, drink alcohol, or use illegaldrugs. Some items may interact with your medicine. What should I watch for while using this medication? Visit your care team for regular check ups. Check your blood pressure as directed. Ask your care team what your blood pressure should be. Also, find outwhen you should contact them. Do not treat yourself for coughs, colds, or pain while you are using this medication without asking your care team for advice. Some medications mayincrease your blood pressure. Women should inform their care team if they wish to become pregnant or think they might be pregnant. There is a potential for serious side effects to anunborn child. Talk to your care team for more information. You may get drowsy or dizzy. Do not drive, use machinery, or do anything that needs mental alertness until you know how this medication affects you. Do not stand or sit up quickly, especially if you are an older patient. This reduces the risk of dizzy or fainting spells. Alcohol can make you more drowsy anddizzy. Avoid alcoholic drinks. Avoid salt substitutes unless you are told otherwise by your care team. What side effects may I notice from receiving this medication? Side effects that you should report to your care team as soon as possible: Allergic reactions-skin rash, itching, hives, swelling of the  face, lips, tongue, or throat High potassium level-muscle weakness, fast or irregular heartbeat Kidney injury-decrease in the amount of urine, swelling of the ankles, hands, or feet Low blood pressure-dizziness, feeling faint or lightheaded, blurry vision Side effects that  usually do not require medical attention (report to your careteam if they continue or are bothersome): Dizziness Headache Runny or stuffy nose This list may not describe all possible side effects. Call your doctor for medical advice about side effects. You may report side effects to FDA at1-800-FDA-1088. Where should I keep my medication? Keep out of the reach of children and pets. Store at room temperature between 20 and 25 degrees C (68 and 77 degrees F). Protect from light. Keep the container tightly closed. Get rid of any unusedmedication after the expiration date. To get rid of medications that are no longer needed or have expired: Take the medication to a medication take-back program. Check with your pharmacy or law enforcement to find a location. If you cannot return the medication, check the label or package insert to see if the medication should be thrown out in the garbage or flushed down the toilet. If you are not sure, ask your care team. If it is safe to put in the trash, empty the medication out of the container. Mix the medication with cat litter, dirt, coffee grounds, or other unwanted substance. Seal the mixture in a bag or container. Put it in the trash. NOTE: This sheet is a summary. It may not cover all possible information. If you have questions about this medicine, talk to your doctor, pharmacist, orhealth care provider.  2022 Elsevier/Gold Standard (2020-02-12 13:49:17)

## 2020-10-06 ENCOUNTER — Other Ambulatory Visit (HOSPITAL_COMMUNITY): Payer: Self-pay

## 2020-10-06 ENCOUNTER — Other Ambulatory Visit: Payer: Self-pay | Admitting: Oncology

## 2020-10-06 MED ORDER — IMBRUVICA 560 MG PO TABS
ORAL_TABLET | ORAL | 0 refills | Status: DC
  Filled 2020-10-06: qty 28, 28d supply, fill #0

## 2020-10-07 ENCOUNTER — Other Ambulatory Visit (HOSPITAL_COMMUNITY): Payer: Self-pay

## 2020-10-20 ENCOUNTER — Other Ambulatory Visit: Payer: Self-pay | Admitting: Adult Health

## 2020-10-20 DIAGNOSIS — I159 Secondary hypertension, unspecified: Secondary | ICD-10-CM

## 2020-10-29 ENCOUNTER — Other Ambulatory Visit (HOSPITAL_COMMUNITY): Payer: Self-pay

## 2020-10-29 ENCOUNTER — Other Ambulatory Visit: Payer: Self-pay | Admitting: Oncology

## 2020-10-29 MED ORDER — IMBRUVICA 560 MG PO TABS
ORAL_TABLET | ORAL | 0 refills | Status: DC
  Filled 2020-10-29: qty 28, 28d supply, fill #0

## 2020-11-04 ENCOUNTER — Other Ambulatory Visit (HOSPITAL_COMMUNITY): Payer: Self-pay

## 2020-11-12 ENCOUNTER — Inpatient Hospital Stay (HOSPITAL_BASED_OUTPATIENT_CLINIC_OR_DEPARTMENT_OTHER): Payer: Medicare Other | Admitting: Oncology

## 2020-11-12 ENCOUNTER — Inpatient Hospital Stay: Payer: Medicare Other | Attending: Oncology

## 2020-11-12 ENCOUNTER — Other Ambulatory Visit: Payer: Self-pay

## 2020-11-12 VITALS — BP 144/69 | HR 73 | Temp 98.7°F | Resp 18 | Ht 59.0 in | Wt 152.9 lb

## 2020-11-12 DIAGNOSIS — D649 Anemia, unspecified: Secondary | ICD-10-CM

## 2020-11-12 DIAGNOSIS — D72819 Decreased white blood cell count, unspecified: Secondary | ICD-10-CM | POA: Insufficient documentation

## 2020-11-12 DIAGNOSIS — R600 Localized edema: Secondary | ICD-10-CM | POA: Diagnosis not present

## 2020-11-12 DIAGNOSIS — C8310 Mantle cell lymphoma, unspecified site: Secondary | ICD-10-CM | POA: Diagnosis not present

## 2020-11-12 DIAGNOSIS — D696 Thrombocytopenia, unspecified: Secondary | ICD-10-CM | POA: Diagnosis not present

## 2020-11-12 DIAGNOSIS — Z79899 Other long term (current) drug therapy: Secondary | ICD-10-CM | POA: Diagnosis not present

## 2020-11-12 DIAGNOSIS — D591 Autoimmune hemolytic anemia, unspecified: Secondary | ICD-10-CM | POA: Insufficient documentation

## 2020-11-12 LAB — CBC WITH DIFFERENTIAL (CANCER CENTER ONLY)
Abs Immature Granulocytes: 0.07 10*3/uL (ref 0.00–0.07)
Basophils Absolute: 0 10*3/uL (ref 0.0–0.1)
Basophils Relative: 1 %
Eosinophils Absolute: 0 10*3/uL (ref 0.0–0.5)
Eosinophils Relative: 1 %
HCT: 42 % (ref 36.0–46.0)
Hemoglobin: 13.8 g/dL (ref 12.0–15.0)
Immature Granulocytes: 2 %
Lymphocytes Relative: 29 %
Lymphs Abs: 1 10*3/uL (ref 0.7–4.0)
MCH: 28.2 pg (ref 26.0–34.0)
MCHC: 32.9 g/dL (ref 30.0–36.0)
MCV: 85.7 fL (ref 80.0–100.0)
Monocytes Absolute: 0.6 10*3/uL (ref 0.1–1.0)
Monocytes Relative: 17 %
Neutro Abs: 1.8 10*3/uL (ref 1.7–7.7)
Neutrophils Relative %: 50 %
Platelet Count: 91 10*3/uL — ABNORMAL LOW (ref 150–400)
RBC: 4.9 MIL/uL (ref 3.87–5.11)
RDW: 13.4 % (ref 11.5–15.5)
WBC Count: 3.6 10*3/uL — ABNORMAL LOW (ref 4.0–10.5)
nRBC: 0 % (ref 0.0–0.2)

## 2020-11-12 LAB — CMP (CANCER CENTER ONLY)
ALT: 23 U/L (ref 0–44)
AST: 26 U/L (ref 15–41)
Albumin: 3.9 g/dL (ref 3.5–5.0)
Alkaline Phosphatase: 61 U/L (ref 38–126)
Anion gap: 10 (ref 5–15)
BUN: 18 mg/dL (ref 8–23)
CO2: 23 mmol/L (ref 22–32)
Calcium: 9.6 mg/dL (ref 8.9–10.3)
Chloride: 105 mmol/L (ref 98–111)
Creatinine: 1.27 mg/dL — ABNORMAL HIGH (ref 0.44–1.00)
GFR, Estimated: 43 mL/min — ABNORMAL LOW (ref >60)
Glucose, Bld: 93 mg/dL (ref 70–99)
Potassium: 4.2 mmol/L (ref 3.5–5.1)
Sodium: 138 mmol/L (ref 135–145)
Total Bilirubin: 1.4 mg/dL — ABNORMAL HIGH (ref 0.3–1.2)
Total Protein: 6.4 g/dL — ABNORMAL LOW (ref 6.5–8.1)

## 2020-11-12 NOTE — Progress Notes (Signed)
Hematology and Oncology Follow Up   Jacqueline Hancock [id number] 10/05/1940 80 y.o. 11/12/2020 8:55 AM Unk Pinto, MDMcKeown, Gwyndolyn Saxon, MD       Principle Diagnosis: 80 year old woman with mantle cell lymphoma presented with lymphocytosis and anemia in December 2021.     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   Prior Therapy:  She is status post bone marrow biopsy completed on March 05, 2020.   Prednisone 60 mg daily in the January 2022 to treat her hemolytic anemia.  This was tapered off after complete hematological response.   Current therapy: Imbruvica 560 mg started on April 28, 2020.  Interim History: Jacqueline Hancock is here for repeat evaluation.  Since her last visit, she reports no major changes in her health.  She denies any complications related to Imbruvica including nausea, fatigue or bruising.  She does have mild lower extremity edema which is unchanged from previous encounters.  She denies any excessive fatigue or tiredness.  She denies any hospitalizations or illnesses.     Medications: Updated on review. Current Outpatient Medications  Medication Sig Dispense Refill   Ascorbic Acid (VITAMIN C) 1000 MG tablet Take 1,000 mg by mouth daily.     calcium carbonate (OSCAL) 1500 (600 Ca) MG TABS tablet Take 500 mg by mouth daily.     Cholecalciferol (VITAMIN D) 2000 UNITS tablet Take 4,000 Units by mouth daily.      Ferrous Sulfate (IRON) 325 (65 Fe) MG TABS Take 325 mg by mouth daily.      ibrutinib (IMBRUVICA) 560 MG TABS TAKE 1 TABLET (560 MG) BY MOUTH DAILY. 28 tablet 0   losartan (COZAAR) 50 MG tablet TAKE 1 TABLET(50 MG) BY MOUTH DAILY 90 tablet 1   nystatin (MYCOSTATIN) 100000 UNIT/ML suspension 5 ml four times a day, retain in mouth as long as possible (Swish and Spit).  Use for 48 hours after symptoms resolve. 80 mL 0   promethazine-dextromethorphan (PROMETHAZINE-DM) 6.25-15 MG/5ML syrup Take 5 mLs by mouth 4 (four) times daily as  needed for cough. 240 mL 1   No current facility-administered medications for this visit.     Allergies:  Allergies  Allergen Reactions   Nexium [Esomeprazole Magnesium] Other (See Comments)    Headache   Physical exam    Blood pressure (!) 144/69, pulse 73, temperature 98.7 F (37.1 C), temperature source Oral, resp. rate 18, height 4' 11"  (1.499 m), weight 152 lb 14.4 oz (69.4 kg), SpO2 100 %.   ECOG 1    General appearance: Alert, awake without any distress. Head: Atraumatic without abnormalities Oropharynx: Without any thrush or ulcers. Eyes: No scleral icterus. Lymph nodes: No lymphadenopathy noted in the cervical, supraclavicular, or axillary nodes Heart:regular rate and rhythm, without any murmurs or gallops.   Lung: Clear to auscultation without any rhonchi, wheezes or dullness to percussion. Abdomin: Soft, nontender without any shifting dullness or ascites. Musculoskeletal: No clubbing or cyanosis. Neurological: No motor or sensory deficits. Skin: No rashes or lesions.        Lab Results: Lab Results  Component Value Date   WBC 4.9 08/11/2020   HGB 15.6 (H) 08/11/2020   HCT 46.7 (H) 08/11/2020   MCV 88.1 08/11/2020   PLT 121 (L) 08/11/2020     Chemistry      Component Value Date/Time   NA 135 08/11/2020 1430   K 4.3 08/11/2020 1430   CL 101 08/11/2020 1430   CO2 23 08/11/2020 1430   BUN 22 08/11/2020 1430  CREATININE 1.35 (H) 08/11/2020 1430   CREATININE 1.15 (H) 08/07/2020 1115      Component Value Date/Time   CALCIUM 9.5 08/11/2020 1430   ALKPHOS 77 08/11/2020 1430   AST 39 08/11/2020 1430   ALT 30 08/11/2020 1430   BILITOT 1.6 (H) 08/11/2020 1430          Impression and Plan:   80 year old woman with:  1.  Mantle cell lymphoma presented with lymphocytosis and anemia in December 2021.     She continues to be on ibrutinib with excellent hematological and clinical response with normalization of her counts and symptoms.   Risks and benefits of continuing this treatment were reviewed at this time.  Complications that include fatigue, GI toxicities well as bleeding complications were reviewed.  Alternative treatment options would be systemic chemotherapy as a salvage option.  Laboratory data from today showed normal hemoglobin and mild leukocytopenia and thrombocytopenia without any symptoms at this time.  I recommended continuing ibrutinib for the time being.  She is agreeable at this time.   2.  Autoimmune hemolytic anemia: Related to mantle cell lymphoma and has resolved at this time.   3.  Follow-up: She will return in 3 months for a follow-up evaluation.  30  minutes were spent on this visit.  The time was dedicated to reviewing laboratory data, treatment choices and complications related to her condition of therapy.   Jacqueline Button, MD 11/12/2020 8:55 AM

## 2020-11-30 ENCOUNTER — Other Ambulatory Visit (HOSPITAL_COMMUNITY): Payer: Self-pay

## 2020-11-30 ENCOUNTER — Other Ambulatory Visit: Payer: Self-pay | Admitting: Oncology

## 2020-11-30 ENCOUNTER — Telehealth: Payer: Self-pay | Admitting: Oncology

## 2020-11-30 MED ORDER — IMBRUVICA 560 MG PO TABS
ORAL_TABLET | ORAL | 0 refills | Status: DC
  Filled 2020-11-30: qty 28, 28d supply, fill #0

## 2020-11-30 NOTE — Telephone Encounter (Signed)
Sch per 8/11 los, pt aware 

## 2020-12-02 ENCOUNTER — Other Ambulatory Visit (HOSPITAL_COMMUNITY): Payer: Self-pay

## 2020-12-24 ENCOUNTER — Other Ambulatory Visit (HOSPITAL_COMMUNITY): Payer: Self-pay

## 2020-12-24 ENCOUNTER — Other Ambulatory Visit: Payer: Self-pay | Admitting: Oncology

## 2020-12-24 MED ORDER — IMBRUVICA 560 MG PO TABS
ORAL_TABLET | ORAL | 0 refills | Status: DC
  Filled 2020-12-24 (×2): qty 28, 28d supply, fill #0

## 2020-12-28 ENCOUNTER — Encounter: Payer: Medicare Other | Admitting: Adult Health

## 2020-12-28 ENCOUNTER — Encounter: Payer: Medicare Other | Admitting: Adult Health Nurse Practitioner

## 2020-12-29 ENCOUNTER — Other Ambulatory Visit (HOSPITAL_COMMUNITY): Payer: Self-pay

## 2021-01-21 DIAGNOSIS — H524 Presbyopia: Secondary | ICD-10-CM | POA: Diagnosis not present

## 2021-01-21 DIAGNOSIS — Z961 Presence of intraocular lens: Secondary | ICD-10-CM | POA: Diagnosis not present

## 2021-01-25 ENCOUNTER — Other Ambulatory Visit (HOSPITAL_COMMUNITY): Payer: Self-pay

## 2021-01-25 ENCOUNTER — Other Ambulatory Visit: Payer: Self-pay | Admitting: Oncology

## 2021-01-25 MED ORDER — IMBRUVICA 560 MG PO TABS
ORAL_TABLET | ORAL | 0 refills | Status: DC
  Filled 2021-01-25: qty 28, 28d supply, fill #0

## 2021-01-27 DIAGNOSIS — M8580 Other specified disorders of bone density and structure, unspecified site: Secondary | ICD-10-CM | POA: Diagnosis not present

## 2021-01-28 ENCOUNTER — Other Ambulatory Visit (HOSPITAL_COMMUNITY): Payer: Self-pay

## 2021-02-01 NOTE — Progress Notes (Signed)
Complete Physical  Assessment / Plan   Jacqueline Hancock was seen today for annual exam.  Diagnoses and all orders for this visit:  Encounter for general adult medical examination with abnormal findings Yearly  Essential hypertension Continue current medications: Losartan 50 mg QD Monitor blood pressure at home; call if consistently over 130/80 Continue DASH diet.   Reminder to go to the ER if any CP, SOB, nausea, dizziness, severe HA, changes vision/speech, left arm numbness and tingling and jaw pain. -     CBC with Differential/Platelet -     COMPLETE METABOLIC PANEL WITH GFR -     TSH -     EKG 12-Lead   Hyperlipidemia, unspecified hyperlipidemia type Controlled with diet and exercise Discussed dietary and exercise modifications Low fat diet -     Lipid panel  Gastroesophageal reflux disease, unspecified whether esophagitis present Doing well at this time Diet discussed Monitor for triggers Avoid food with high acid content Avoid excessive cafeine Increase water intake   Vitamin D deficiency Continue supplementation to maintain goal of 70-100 Taking Vitamin D 5,000 IU daily -     VITAMIN D 25 Hydroxy (Vit-D Deficiency, Fractures)  Osteopenia, unspecified location DEXA UTD, Q2 Follows with GYN  Stage 3 chronic kidney disease, unspecified whether stage 3a or 3b CKD Increase fluids  Avoid NSAIDS Blood pressure control Monitor sugars  Will continue to monitor -     Urinalysis w microscopic + reflex cultur - Microalbumin/creatinine urine ratio   Abnormal glucose Discussed dietary and exercise modifications -     Hemoglobin A1c  Flu Vaccine Need High dose flu vaccine given  Obesity Discussed dietary and exercise modifications  Medication management Magnesium  Mantle cell lymphoma  Autoimmune hemolytic anemia Continue Ibrutinib Continue to follow with Dr. Osker Mason, oncology  Diastasis recti To monitor if she develops severe abdominal pain with fever, vomiting  or nausea go to the ER  Allergic conjunctivitis Patanol eye drops- 1 drop in each eye twice a day Change antihistamine to Zyrtec Monitor and if symptoms persist will evaluate further   Over 40 minutes of face to face interview, exam, counseling, chart review, and critical decision making was performed Future Appointments  Date Time Provider [place]  02/11/2021  3:00 PM [facility] None  02/11/2021  3:30 PM Wyatt Portela, MD [facility] None  02/03/2022  3:00 PM Magda Bernheim, NP [facility] None      Subjective:   Jacqueline Hancock is a 80 y.o.  female who presents for CPE and 3 month follow up on HTN, HLD, prediabetes/abnormal glucose, vitamin D def.   She has been having red, watery eyes and associated right frontal and maxillary sinus pain, feels congested and is having post nasal drip. She does use Allegra daily , has used this same antibiotic for a long time.  BMI is Body mass index is 30.38 kg/m., she is working on diet and exercise. Wt Readings from Last 3 Encounters:  02/03/21 150 lb 6.4 oz (68.2 kg)  11/12/20 152 lb 14.4 oz (69.4 kg)  09/23/20 150 lb (68 kg)   She has been having dry itchy skin x several months.  Denies rashes.  She has not been checking her blood pressure at home, today their BP is BP: 140/70 She does workout, does it in her house. She denies chest pain, shortness of breath, dizziness.  She is not on cholesterol medication . Her cholesterol is not at goal. The cholesterol last visit was:   Lab  Results  Component Value Date   CHOL 209 (H) 08/07/2020   HDL 53 08/07/2020   LDLCALC 126 (H) 08/07/2020   TRIG 181 (H) 08/07/2020   CHOLHDL 3.9 08/07/2020   She has been working on diet and exercise for prediabetes, and denies paresthesia of the feet, polydipsia, polyuria and visual disturbances. Last A1C in the office was:  Lab Results  Component Value Date   HGBA1C 4.5 12/24/2019   Lab Results  Component Value Date   GFRAA 52  (L) 08/07/2020   Patient is on Vitamin D supplement. Lab Results  Component Value Date   VD25OH 56 08/07/2020      Medication Review Current Outpatient Medications on File Prior to Visit  Medication Sig Dispense Refill   Ascorbic Acid (VITAMIN C) 1000 MG tablet Take 1,000 mg by mouth daily.     calcium carbonate (OSCAL) 1500 (600 Ca) MG TABS tablet Take 500 mg by mouth daily.     Cholecalciferol (VITAMIN D) 2000 UNITS tablet Take 4,000 Units by mouth daily.      Ferrous Sulfate (IRON) 325 (65 Fe) MG TABS Take 325 mg by mouth daily.      ibrutinib (IMBRUVICA) 560 MG TABS TAKE 1 TABLET (560 MG) BY MOUTH DAILY. 28 tablet 0   losartan (COZAAR) 50 MG tablet TAKE 1 TABLET(50 MG) BY MOUTH DAILY 90 tablet 1   promethazine-dextromethorphan (PROMETHAZINE-DM) 6.25-15 MG/5ML syrup Take 5 mLs by mouth 4 (four) times daily as needed for cough. 240 mL 1   nystatin (MYCOSTATIN) 100000 UNIT/ML suspension 5 ml four times a day, retain in mouth as long as possible (Swish and Spit).  Use for 48 hours after symptoms resolve. (Patient not taking: Reported on 02/03/2021) 80 mL 0   [DISCONTINUED] gabapentin (NEURONTIN) 100 MG capsule Take 1 capsule 3 x /day for Neuropathy Pain (Patient not taking: Reported on 01/25/2020) 270 capsule 0   No current facility-administered medications on file prior to visit.    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 [facility]- here for primary care 2. Dr. Gershon Crane, eye doctor, 2020, due for 2021 3. Has partial, dentist, last visit 2 years  Patient Care Team: Unk Pinto, MD as PCP - General (Internal Medicine) Jodi Marble, MD as Consulting Physician (Otolaryngology) Richmond Campbell, MD as Consulting Physician (Gastroenterology) Leo Grosser, Seymour Bars, MD (Inactive) as Consulting Physician (Obstetrics and Gynecology) Rutherford Guys, MD as Consulting Physician (Ophthalmology)  Allergies Allergies  Allergen Reactions   Nexium [Esomeprazole Magnesium] Other (See Comments)    Headache    SURGICAL HISTORY She  has a past surgical history that includes Parotid gland tumor excision (2012); Abdominal hysterectomy; Appendectomy; Tonsillectomy and adenoidectomy; and Cataract extraction, bilateral (Bilateral, 09/2017). FAMILY HISTORY Her family history includes Diabetes in her brother, brother, and sister; Heart disease in her brother; Hypertension in her father; Leukemia in her mother. SOCIAL HISTORY She  reports that she has never smoked. She has never used smokeless tobacco.  She reports that she does not drink alcohol and does not use drugs.  Review of Systems  Constitutional:  Negative for chills and fever.  HENT:  Positive for congestion and ear pain. Negative for hearing loss, sinus pain, sore throat and tinnitus.   Eyes:  Positive for redness. Negative for blurred vision and double vision.  Respiratory:  Negative for cough, hemoptysis, sputum production, shortness of breath and wheezing.   Cardiovascular:  Negative for chest pain, palpitations and leg swelling.  Gastrointestinal:  Negative for abdominal pain, constipation, diarrhea, heartburn, nausea and vomiting.  Genitourinary:  Negative for dysuria and urgency.  Musculoskeletal:  Negative for back pain, falls, joint pain, myalgias and neck pain.  Skin:  Positive for itching (dry skin). Negative for rash.  Neurological:  Negative for  dizziness, tingling, tremors, weakness and headaches.  Endo/Heme/Allergies:  Does not bruise/bleed easily.  Psychiatric/Behavioral:  Negative for depression and suicidal ideas. The patient is not nervous/anxious and does not have insomnia.      Objective:   Today's Vitals   02/03/21 1447  BP: 140/70  Pulse: 72  Temp: 97.7 F (36.5 C)  SpO2: 98%  Weight: 150 lb 6.4 oz (68.2 kg)  Height: 4\' 11"  (1.499 m)    Body mass index is 30.38 kg/m.  General appearance: alert, no distress, WD/WN,  female HEENT: normocephalic, sclerae anicteric, TMs pearly, nares patent, no discharge or erythema, pharynx normal Oral cavity: MMM, no lesions Neck: supple, no lymphadenopathy, no thyromegaly, no masses Heart: RRR, normal S1, S2, no murmurs Lungs: CTA bilaterally, no wheezes, rhonchi, or rales Abdomen: +bs, soft, + epigastric tender, non distended, no masses, no hepatomegaly, no splenomegaly. Abdomen  Musculoskeletal: nontender, no swelling, no obvious deformity Extremities: no edema, no cyanosis, no clubbing Pulses: 2+ symmetric, upper and lower extremities, normal cap refill Neurological: alert, oriented x 3, CN2-12 intact, strength normal upper extremities and lower extremities, sensation normal throughout, DTRs 2+ throughout, no cerebellar signs, gait normal Psychiatric: normal affect, behavior normal, pleasant  EKG: NSR, no ST changes Breast: defer Gyn: defer Rectal: defer  EKG: NSR  [facility]  02/03/2021

## 2021-02-03 ENCOUNTER — Other Ambulatory Visit: Payer: Self-pay

## 2021-02-03 ENCOUNTER — Ambulatory Visit (INDEPENDENT_AMBULATORY_CARE_PROVIDER_SITE_OTHER): Payer: Medicare Other | Admitting: Nurse Practitioner

## 2021-02-03 ENCOUNTER — Encounter: Payer: Self-pay | Admitting: Nurse Practitioner

## 2021-02-03 VITALS — BP 140/70 | HR 72 | Temp 97.7°F | Ht 59.0 in | Wt 150.4 lb

## 2021-02-03 DIAGNOSIS — Z0001 Encounter for general adult medical examination with abnormal findings: Secondary | ICD-10-CM

## 2021-02-03 DIAGNOSIS — D5919 Other autoimmune hemolytic anemia: Secondary | ICD-10-CM

## 2021-02-03 DIAGNOSIS — M8580 Other specified disorders of bone density and structure, unspecified site: Secondary | ICD-10-CM

## 2021-02-03 DIAGNOSIS — Z79899 Other long term (current) drug therapy: Secondary | ICD-10-CM

## 2021-02-03 DIAGNOSIS — Z0000 Encounter for general adult medical examination without abnormal findings: Secondary | ICD-10-CM

## 2021-02-03 DIAGNOSIS — E785 Hyperlipidemia, unspecified: Secondary | ICD-10-CM

## 2021-02-03 DIAGNOSIS — K219 Gastro-esophageal reflux disease without esophagitis: Secondary | ICD-10-CM

## 2021-02-03 DIAGNOSIS — I10 Essential (primary) hypertension: Secondary | ICD-10-CM

## 2021-02-03 DIAGNOSIS — R7989 Other specified abnormal findings of blood chemistry: Secondary | ICD-10-CM

## 2021-02-03 DIAGNOSIS — N183 Chronic kidney disease, stage 3 unspecified: Secondary | ICD-10-CM

## 2021-02-03 DIAGNOSIS — E559 Vitamin D deficiency, unspecified: Secondary | ICD-10-CM

## 2021-02-03 DIAGNOSIS — Z1389 Encounter for screening for other disorder: Secondary | ICD-10-CM

## 2021-02-03 DIAGNOSIS — Z136 Encounter for screening for cardiovascular disorders: Secondary | ICD-10-CM | POA: Diagnosis not present

## 2021-02-03 DIAGNOSIS — M6208 Separation of muscle (nontraumatic), other site: Secondary | ICD-10-CM

## 2021-02-03 DIAGNOSIS — E669 Obesity, unspecified: Secondary | ICD-10-CM

## 2021-02-03 DIAGNOSIS — R946 Abnormal results of thyroid function studies: Secondary | ICD-10-CM

## 2021-02-03 DIAGNOSIS — R7309 Other abnormal glucose: Secondary | ICD-10-CM

## 2021-02-03 DIAGNOSIS — H1013 Acute atopic conjunctivitis, bilateral: Secondary | ICD-10-CM

## 2021-02-03 DIAGNOSIS — C8310 Mantle cell lymphoma, unspecified site: Secondary | ICD-10-CM

## 2021-02-03 DIAGNOSIS — Z23 Encounter for immunization: Secondary | ICD-10-CM | POA: Diagnosis not present

## 2021-02-03 MED ORDER — OLOPATADINE HCL 0.1 % OP SOLN: 1.0000 [drp] | Freq: Two times a day (BID) | OPHTHALMIC | 1 refills | Status: DC

## 2021-02-03 NOTE — Patient Instructions (Addendum)
Apply cerave cream to skin daily Change from Allegra to Zyrtec for 3 months then switch back to Allegra  Diastasis Recti Diastasis recti is a condition in which the muscles of the abdomen (rectus abdominis muscles) become thin and separate. The result is a wider space between the muscles of the right and left abdomen (abdominal muscles). This wider space between the muscles may cause a bulge in the middle of the abdomen. This bulge may be noticed when a person is straining or when he or she sits up after lying down. Diastasis recti can affect men and women. It is most common among pregnant women, babies, people with obesity, and people who have had abdominal surgery. Exercise or surgery may help correct this condition. What are the causes? Common causes of this condition include: Pregnancy. As the uterus grows in size, it puts pressure on the abdominal muscles, causing the muscles to separate. Obesity. Excess fat puts pressure on abdominal muscles. Weight lifting. Some exercises of the abdomen. Advanced age. Genetics. Having had surgery on the abdomen before. What increases the risk? This condition is more likely to develop in: Women. Newborns, especially newborns who are born early (prematurely). What are the signs or symptoms? Common symptoms of this condition include: A bulge in the middle of your abdomen. You will notice it most when you sit up or strain. Pain in your low back, hips, or the area between your hip bones (pelvis). Constipation. Being unable to control when you urinate (urinary incontinence). Bloating. Poor posture. How is this diagnosed? This condition is diagnosed with a physical exam. During the exam, your health care provider will ask you to lie flat on your back and do a crunch or half sit-up. If you have diastasis recti, a bulge will appear lengthwise between your abdominal muscles in the center of your abdomen. Your health care provider will measure the gap between  your muscles with one of the following: A medical device used to measure the space between two objects (caliper). A tape measure. CT scan. Ultrasound. Finger spaces. Your health care provider will measure the space using his or her fingers. How is this treated? If your muscle separation is not too large, you may not need treatment. However, if you are a woman who plans to become pregnant again, you should treat this condition before your next pregnancy. Treatment may include: Physical therapy exercises to strengthen and tighten your abdominal muscles. Lifestyle changes such as weight loss and exercise. Over-the-counter pain medicines as needed. Surgery to correct the separation. Follow these instructions at home: Activity Return to your normal activities as told by your health care provider. Ask your health care provider what activities are safe for you. Do exercises as told by your health care provider. Make sure you are doing your exercises and movements correctly when lifting weights or doing exercises using your abdominal muscles or the muscles in the center of your body that give stability (core muscles). Proper form can help to prevent this condition from happening again. General instructions If you are overweight, ask your health care provider for help with weight loss. Losing even a small amount of weight can help to improve your diastasis recti. Take over-the-counter or prescription medicines only as told by your health care provider. Do not strain. Straining can make the separation worse. Examples of straining include: Pushing hard to have a bowel movement, such as when you have constipation. Lifting heavy objects or lifting children. Standing up and sitting down. You may need  to take these actions to prevent or treat constipation: Drink enough fluid to keep your urine pale yellow. Take over-the-counter or prescription medicines. Eat foods that are high in fiber, such as beans,  whole grains, and fresh fruits and vegetables. Limit foods that are high in fat and processed sugars, such as fried or sweet foods. Keep all follow-up visits. This is important. Contact a health care provider if: You notice a new bulge in your abdomen. Get help right away if: You experience severe discomfort in your abdomen. You develop severe abdominal pain along with nausea, vomiting, or a fever. Summary Diastasis recti is a condition in which the muscles of the abdomen (rectus abdominismuscles) become thin and separate. You may notice a bulge in your abdomen because the space has widened between the muscles of the right and left abdomen. The most common symptom is a bulge in the middle of your abdomen. You will notice it most when you sit up or strain. This condition is diagnosed with a physical exam. If the muscle separation is not too big, you may not need treatment. Otherwise, you may need to do physical therapy or have surgery. This information is not intended to replace advice given to you by your health care provider. Make sure you discuss any questions you have with your health care provider. Document Revised: 11/22/2019 Document Reviewed: 11/22/2019 Elsevier Patient Education  2022 [employer].  Allergic Conjunctivitis, Adult Allergic conjunctivitis is inflammation of the conjunctiva. The conjunctiva is the thin, clear membrane that covers the white part of the eye and the inner surface of the eyelid. In this condition: The blood vessels in the conjunctiva become irritated and swell. The eyes become red or pink and feel itchy. Allergic conjunctivitis cannot be spread from person to person. This condition can develop at any age and may be outgrown. What are the causes? This condition is caused by allergens. These are things that can cause an allergic reaction in some people but not in other people. Common allergens include: Outdoor allergens, such as: Pollen, including pollen  from grass and weeds. Mold spores. Car fumes. Indoor allergens, such as: Dust. Smoke. Mold spores. Proteins in a pet's urine, saliva, or dander. What increases the risk? You may be more likely to develop this condition if you have a family history of these things: Allergies. Conditions caused by being exposed to allergens, such as: Allergic rhinitis. This is an allergic reaction that affects the nose. Bronchial asthma. This condition affects the large airways in the lungs and makes breathing difficult. Atopic dermatitis (eczema). This is inflammation of the skin that is long-term (chronic). What are the signs or symptoms? Symptoms of this condition include eyes that are: Itchy. Red. Watery. Puffy. Your eyes may also: Sting or burn. Have clear fluid draining from them. Have thick mucus discharge and pain (vernal conjunctivitis). How is this diagnosed? This condition may be diagnosed by: Your medical history. A physical exam. Tests of the fluid draining from your eyes to rule out other causes. Other tests to confirm the diagnosis, including: Testing for allergies. The skin may be pricked with a tiny needle. The pricked area is then exposed to small amounts of allergens. Testing for other eye conditions. Tests may include: Blood tests. Tissue scrapings from your eyelid. The tissue is then checked under a microscope. How is this treated? This condition may be treated with: Cold, wet cloths (cold compresses) to soothe itching and swelling. Washing the face to remove allergens. Eye drops. These may  be prescription or over-the-counter. You may need to try different types to see which one works best for you, such as: Eye drops that block the allergic reaction (antihistamine). Eye drops that reduce swelling and irritation (anti-inflammatory). Steroid eye drops, which may be given if other treatments have not worked (vernal conjunctivitis). Oral antihistamine medicines. These are  medicines taken by mouth to lessen your allergic reaction. You may need these if eye drops do not help or are difficult to use. Follow these instructions at home: Eye care Apply a clean, cold compress to your eyes for 10-20 minutes, 3-4 times a day. Do not touch or rub your eyes. Do not wear contact lenses until the inflammation is gone. Wear glasses instead. Do not wear eye makeup until the inflammation is gone. General instructions Avoid known allergens whenever possible. Take or apply over-the-counter and prescription medicines only as told by your health care provider. These include any eye drops. Drink enough fluid to keep your urine pale yellow. Keep all follow-up visits as told by your health care provider. This is important. Contact a health care provider if: Your symptoms get worse or do not get better with treatment. You have mild eye pain. You become sensitive to light. You have spots or blisters on your eyes. You have pus draining from your eyes. You have a fever. Get help right away if: You have redness, swelling, or other symptoms in only one eye. Your vision is blurred or you have other vision changes. You have severe eye pain. Summary Allergic conjunctivitis is inflammation of the clear membrane that covers the white part of the eye and the inner surface of the eyelid. Take or apply over-the-counter and prescription medicines only as told by your health care provider. These include eye drops. Do not touch or rub your eyes. Contact a health care provider if your symptoms get worse or do not get better with treatment. This information is not intended to replace advice given to you by your health care provider. Make sure you discuss any questions you have with your health care provider. Document Revised: 02/11/2019 Document Reviewed: 02/11/2019 Elsevier Patient Education  2022 [employer].

## 2021-02-04 LAB — MICROALBUMIN / CREATININE URINE RATIO
Creatinine, Urine: 40 mg/dL (ref 20–275)
Microalb Creat Ratio: 23 mcg/mg creat (ref <30)
Microalb, Ur: 0.9 mg/dL

## 2021-02-04 LAB — VITAMIN D 25 HYDROXY (VIT D DEFICIENCY, FRACTURES): Vit D, 25-Hydroxy: 55 ng/mL (ref 30–100)

## 2021-02-04 LAB — COMPLETE METABOLIC PANEL WITH GFR
AG Ratio: 1.9 (calc) (ref 1.0–2.5)
ALT: 55 U/L — ABNORMAL HIGH (ref 6–29)
AST: 39 U/L — ABNORMAL HIGH (ref 10–35)
Albumin: 4.6 g/dL (ref 3.6–5.1)
Alkaline phosphatase (APISO): 52 U/L (ref 37–153)
BUN/Creatinine Ratio: 17 (calc) (ref 6–22)
BUN: 21 mg/dL (ref 7–25)
CO2: 25 mmol/L (ref 20–32)
Calcium: 10.4 mg/dL (ref 8.6–10.4)
Chloride: 101 mmol/L (ref 98–110)
Creat: 1.23 mg/dL — ABNORMAL HIGH (ref 0.60–0.95)
Globulin: 2.4 g/dL (calc) (ref 1.9–3.7)
Glucose, Bld: 72 mg/dL (ref 65–99)
Potassium: 4.4 mmol/L (ref 3.5–5.3)
Sodium: 138 mmol/L (ref 135–146)
Total Bilirubin: 1.6 mg/dL — ABNORMAL HIGH (ref 0.2–1.2)
Total Protein: 7 g/dL (ref 6.1–8.1)
eGFR: 44 mL/min/{1.73_m2} — ABNORMAL LOW (ref >=60)

## 2021-02-04 LAB — CBC WITH DIFFERENTIAL/PLATELET
Absolute Monocytes: 379 cells/uL (ref 200–950)
Basophils Absolute: 19 cells/uL (ref 0–200)
Basophils Relative: 0.4 %
Eosinophils Absolute: 48 cells/uL (ref 15–500)
Eosinophils Relative: 1 %
HCT: 46.7 % — ABNORMAL HIGH (ref 35.0–45.0)
Hemoglobin: 15 g/dL (ref 11.7–15.5)
Lymphs Abs: 1402 cells/uL (ref 850–3900)
MCH: 27.2 pg (ref 27.0–33.0)
MCHC: 32.1 g/dL (ref 32.0–36.0)
MCV: 84.6 fL (ref 80.0–100.0)
MPV: 13.3 fL — ABNORMAL HIGH (ref 7.5–12.5)
Monocytes Relative: 7.9 %
Neutro Abs: 2952 cells/uL (ref 1500–7800)
Neutrophils Relative %: 61.5 %
Platelets: 106 10*3/uL — ABNORMAL LOW (ref 140–400)
RBC: 5.52 10*6/uL — ABNORMAL HIGH (ref 3.80–5.10)
RDW: 12.8 % (ref 11.0–15.0)
Total Lymphocyte: 29.2 %
WBC: 4.8 10*3/uL (ref 3.8–10.8)

## 2021-02-04 LAB — LIPID PANEL
Cholesterol: 192 mg/dL (ref <200)
HDL: 57 mg/dL (ref >=50)
LDL Cholesterol (Calc): 107 mg/dL (calc) — ABNORMAL HIGH
Non-HDL Cholesterol (Calc): 135 mg/dL (calc) — ABNORMAL HIGH (ref <130)
Total CHOL/HDL Ratio: 3.4 (calc) (ref <5.0)
Triglycerides: 168 mg/dL — ABNORMAL HIGH (ref <150)

## 2021-02-04 LAB — HEMOGLOBIN A1C
Hgb A1c MFr Bld: 5 % of total Hgb (ref <5.7)
Mean Plasma Glucose: 97 mg/dL
eAG (mmol/L): 5.4 mmol/L

## 2021-02-04 LAB — MAGNESIUM: Magnesium: 2.2 mg/dL (ref 1.5–2.5)

## 2021-02-04 LAB — TSH: TSH: 4.76 mIU/L — ABNORMAL HIGH (ref 0.40–4.50)

## 2021-02-04 NOTE — Addendum Note (Signed)
Addended by: Magda Bernheim on: 02/04/2021 04:58 PM   Modules accepted: Orders

## 2021-02-11 ENCOUNTER — Other Ambulatory Visit: Payer: Self-pay

## 2021-02-11 ENCOUNTER — Inpatient Hospital Stay: Payer: Medicare Other | Attending: Oncology

## 2021-02-11 ENCOUNTER — Inpatient Hospital Stay: Payer: Medicare Other | Admitting: Oncology

## 2021-02-11 VITALS — BP 159/66 | HR 65 | Temp 98.0°F | Resp 17 | Ht 59.0 in | Wt 147.7 lb

## 2021-02-11 DIAGNOSIS — R0981 Nasal congestion: Secondary | ICD-10-CM | POA: Diagnosis not present

## 2021-02-11 DIAGNOSIS — D649 Anemia, unspecified: Secondary | ICD-10-CM

## 2021-02-11 DIAGNOSIS — C8310 Mantle cell lymphoma, unspecified site: Secondary | ICD-10-CM

## 2021-02-11 DIAGNOSIS — D591 Autoimmune hemolytic anemia, unspecified: Secondary | ICD-10-CM | POA: Insufficient documentation

## 2021-02-11 DIAGNOSIS — R0982 Postnasal drip: Secondary | ICD-10-CM | POA: Insufficient documentation

## 2021-02-11 DIAGNOSIS — Z79899 Other long term (current) drug therapy: Secondary | ICD-10-CM | POA: Diagnosis not present

## 2021-02-11 LAB — CBC WITH DIFFERENTIAL (CANCER CENTER ONLY)
Abs Immature Granulocytes: 0.27 10*3/uL — ABNORMAL HIGH (ref 0.00–0.07)
Basophils Absolute: 0.1 10*3/uL (ref 0.0–0.1)
Basophils Relative: 1 %
Eosinophils Absolute: 0.1 10*3/uL (ref 0.0–0.5)
Eosinophils Relative: 1 %
HCT: 45.2 % (ref 36.0–46.0)
Hemoglobin: 15.1 g/dL — ABNORMAL HIGH (ref 12.0–15.0)
Immature Granulocytes: 5 %
Lymphocytes Relative: 30 %
Lymphs Abs: 1.6 10*3/uL (ref 0.7–4.0)
MCH: 28.3 pg (ref 26.0–34.0)
MCHC: 33.4 g/dL (ref 30.0–36.0)
MCV: 84.6 fL (ref 80.0–100.0)
Monocytes Absolute: 0.9 10*3/uL (ref 0.1–1.0)
Monocytes Relative: 16 %
Neutro Abs: 2.6 10*3/uL (ref 1.7–7.7)
Neutrophils Relative %: 47 %
Platelet Count: 106 10*3/uL — ABNORMAL LOW (ref 150–400)
RBC: 5.34 MIL/uL — ABNORMAL HIGH (ref 3.87–5.11)
RDW: 13.5 % (ref 11.5–15.5)
WBC Count: 5.5 10*3/uL (ref 4.0–10.5)
nRBC: 0 % (ref 0.0–0.2)

## 2021-02-11 LAB — CMP (CANCER CENTER ONLY)
ALT: 34 U/L (ref 0–44)
AST: 30 U/L (ref 15–41)
Albumin: 4.3 g/dL (ref 3.5–5.0)
Alkaline Phosphatase: 60 U/L (ref 38–126)
Anion gap: 10 (ref 5–15)
BUN: 21 mg/dL (ref 8–23)
CO2: 25 mmol/L (ref 22–32)
Calcium: 10 mg/dL (ref 8.9–10.3)
Chloride: 102 mmol/L (ref 98–111)
Creatinine: 1.33 mg/dL — ABNORMAL HIGH (ref 0.44–1.00)
GFR, Estimated: 40 mL/min — ABNORMAL LOW (ref >60)
Glucose, Bld: 83 mg/dL (ref 70–99)
Potassium: 4.7 mmol/L (ref 3.5–5.1)
Sodium: 137 mmol/L (ref 135–145)
Total Bilirubin: 1.9 mg/dL — ABNORMAL HIGH (ref 0.3–1.2)
Total Protein: 7 g/dL (ref 6.5–8.1)

## 2021-02-11 NOTE — Progress Notes (Signed)
Hematology and Oncology Follow Up   Jacqueline Hancock [id number] [birth year] 80 y.o. 02/11/2021 2:37 PM Jacqueline Hancock, MDMcKeown, Jacqueline Saxon, MD       Principle Diagnosis: 80 year old woman with mantle cell lymphoma diagnosed in December 2021.  She presented with lymphocytosis and anemia.    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   Prior Therapy:  She is status post bone marrow biopsy completed on March 05, 2020.   Prednisone 60 mg daily in the January 2022 to treat her hemolytic anemia.  This was tapered off after complete hematological response.   Current therapy: Imbruvica 560 mg started on April 28, 2020.  Interim History: [name] presents today for a follow-up visit.  Since the last visit, reports no major changes in her health.  She has reported sinus congestion and postnasal drip but no fevers chills or sweats.  He denies any bleeding complications.  She denies any hematochezia, melena or hemoptysis.  Her performance status quality of life remained reasonable.  She has tolerated Imbruvica without any new complaints.     Medications: Reviewed without changes. Current Outpatient Medications  Medication Sig Dispense Refill   Ascorbic Acid (VITAMIN C) 1000 MG tablet Take 1,000 mg by mouth daily.     calcium carbonate (OSCAL) 1500 (600 Ca) MG TABS tablet Take 500 mg by mouth daily.     Cholecalciferol (VITAMIN D) 2000 UNITS tablet Take 4,000 Units by mouth daily.      Ferrous Sulfate (IRON) 325 (65 Fe) MG TABS Take 325 mg by mouth daily.      ibrutinib (IMBRUVICA) 560 MG TABS TAKE 1 TABLET (560 MG) BY MOUTH DAILY. 28 tablet 0   losartan (COZAAR) 50 MG tablet TAKE 1 TABLET(50 MG) BY MOUTH DAILY 90 tablet 1   nystatin (MYCOSTATIN) 100000 UNIT/ML suspension 5 ml four times a day, retain in mouth as long as possible (Swish and Spit).  Use for 48 hours after symptoms resolve. (Patient not taking: Reported on 02/03/2021) 80 mL 0   olopatadine  (PATANOL) 0.1 % ophthalmic solution Place 1 drop into both eyes 2 (two) times daily. 5 mL 1   promethazine-dextromethorphan (PROMETHAZINE-DM) 6.25-15 MG/5ML syrup Take 5 mLs by mouth 4 (four) times daily as needed for cough. 240 mL 1   No current facility-administered medications for this visit.     Allergies:  Allergies  Allergen Reactions   Nexium [Esomeprazole Magnesium] Other (See Comments)    Headache   Physical exam     Blood pressure (!) 159/66, pulse 65, temperature 98 F (36.7 C), temperature source Temporal, resp. rate 17, height 4' 11"  (1.499 m), weight 147 lb 11.2 oz (67 kg), SpO2 100 %.   ECOG 1    General appearance: Comfortable appearing without any discomfort Head: Normocephalic without any trauma Oropharynx: Mucous membranes are moist and pink without any thrush or ulcers. Eyes: Pupils are equal and round reactive to light. Lymph nodes: No cervical, supraclavicular, inguinal or axillary lymphadenopathy.   Heart:regular rate and rhythm.  S1 and S2 without leg edema. Lung: Clear without any rhonchi or wheezes.  No dullness to percussion. Abdomin: Soft, nontender, nondistended with good bowel sounds.  No hepatosplenomegaly. Musculoskeletal: No joint deformity or effusion.  Full range of motion noted. Neurological: No deficits noted on motor, sensory and deep tendon reflex exam. Skin: No petechial rash or dryness.  Appeared moist.         Lab Results: Lab Results  Component Value Date   WBC 5.5 02/11/2021  HGB 15.1 (H) 02/11/2021   HCT 45.2 02/11/2021   MCV 84.6 02/11/2021   PLT 106 (L) 02/11/2021     Chemistry      Component Value Date/Time   NA 138 02/03/2021 1546   K 4.4 02/03/2021 1546   CL 101 02/03/2021 1546   CO2 25 02/03/2021 1546   BUN 21 02/03/2021 1546   CREATININE 1.23 (H) 02/03/2021 1546      Component Value Date/Time   CALCIUM 10.4 02/03/2021 1546   ALKPHOS 61 11/12/2020 0840   AST 39 (H) 02/03/2021 1546   AST 26  11/12/2020 0840   ALT 55 (H) 02/03/2021 1546   ALT 23 11/12/2020 0840   BILITOT 1.6 (H) 02/03/2021 1546   BILITOT 1.4 (H) 11/12/2020 0840          Impression and Plan:   80 year old woman with:  1.  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  She was found to have severe anemia and lymphocytosis and splenomegaly.  Laboratory data from today reviewed and continues to show well complete hematological response with normalization of her lymphocytosis and anemia.  Risks and benefits of continuing ibrutinib were discussed at this time.  Complications that include nausea, fatigue, bruising and cardiac issues were reiterated.  After discussion she is agreeable to continue.   2.  Autoimmune hemolytic anemia: Hemoglobin is normal and her hemolysis has resolved at this time.  This is related to her lymphoma.   3.  Follow-up: In 4 months for repeat follow-up.  30  minutes were dedicated to this encounter.  The time was spent on reviewing laboratory data, disease status update and outlining future plan of care.   Zola Button, MD 02/11/2021 2:37 PM

## 2021-02-15 DIAGNOSIS — Z1231 Encounter for screening mammogram for malignant neoplasm of breast: Secondary | ICD-10-CM | POA: Diagnosis not present

## 2021-02-18 ENCOUNTER — Other Ambulatory Visit (HOSPITAL_COMMUNITY): Payer: Self-pay

## 2021-02-18 ENCOUNTER — Other Ambulatory Visit: Payer: Self-pay | Admitting: Oncology

## 2021-02-18 MED ORDER — IMBRUVICA 560 MG PO TABS
ORAL_TABLET | ORAL | 0 refills | Status: DC
  Filled 2021-02-18: qty 28, 28d supply, fill #0

## 2021-02-22 DIAGNOSIS — M8589 Other specified disorders of bone density and structure, multiple sites: Secondary | ICD-10-CM | POA: Diagnosis not present

## 2021-02-23 ENCOUNTER — Other Ambulatory Visit (HOSPITAL_COMMUNITY): Payer: Self-pay

## 2021-03-18 ENCOUNTER — Other Ambulatory Visit: Payer: Medicare Other

## 2021-03-19 ENCOUNTER — Other Ambulatory Visit: Payer: Self-pay | Admitting: Oncology

## 2021-03-19 ENCOUNTER — Other Ambulatory Visit (HOSPITAL_COMMUNITY): Payer: Self-pay

## 2021-03-19 MED ORDER — IMBRUVICA 560 MG PO TABS
ORAL_TABLET | ORAL | 0 refills | Status: DC
  Filled 2021-03-19: qty 28, 28d supply, fill #0

## 2021-03-22 ENCOUNTER — Other Ambulatory Visit (HOSPITAL_COMMUNITY): Payer: Self-pay

## 2021-03-25 ENCOUNTER — Other Ambulatory Visit: Payer: Self-pay

## 2021-03-25 ENCOUNTER — Other Ambulatory Visit (INDEPENDENT_AMBULATORY_CARE_PROVIDER_SITE_OTHER): Payer: Medicare Other

## 2021-03-25 DIAGNOSIS — R7989 Other specified abnormal findings of blood chemistry: Secondary | ICD-10-CM

## 2021-03-25 DIAGNOSIS — R946 Abnormal results of thyroid function studies: Secondary | ICD-10-CM

## 2021-03-26 LAB — HEPATIC FUNCTION PANEL
AG Ratio: 1.8 (calc) (ref 1.0–2.5)
ALT: 61 U/L — ABNORMAL HIGH (ref 6–29)
AST: 22 U/L (ref 10–35)
Albumin: 4.2 g/dL (ref 3.6–5.1)
Alkaline phosphatase (APISO): 130 U/L (ref 37–153)
Bilirubin, Direct: 0.4 mg/dL — ABNORMAL HIGH (ref 0.0–0.2)
Globulin: 2.4 g/dL (calc) (ref 1.9–3.7)
Indirect Bilirubin: 0.9 mg/dL (calc) (ref 0.2–1.2)
Total Bilirubin: 1.3 mg/dL — ABNORMAL HIGH (ref 0.2–1.2)
Total Protein: 6.6 g/dL (ref 6.1–8.1)

## 2021-03-26 LAB — TSH: TSH: 3.03 mIU/L (ref 0.40–4.50)

## 2021-03-30 ENCOUNTER — Other Ambulatory Visit (HOSPITAL_COMMUNITY): Payer: Self-pay

## 2021-03-30 NOTE — Progress Notes (Signed)
Pt is aware of results, did not have any questions for the provider or nurse.

## 2021-04-12 ENCOUNTER — Other Ambulatory Visit: Payer: Self-pay

## 2021-04-12 ENCOUNTER — Ambulatory Visit (INDEPENDENT_AMBULATORY_CARE_PROVIDER_SITE_OTHER): Payer: Medicare Other

## 2021-04-12 ENCOUNTER — Encounter (HOSPITAL_COMMUNITY): Payer: Self-pay

## 2021-04-12 ENCOUNTER — Ambulatory Visit (HOSPITAL_COMMUNITY)
Admission: EM | Admit: 2021-04-12 | Discharge: 2021-04-12 | Disposition: A | Discharge status: Home / Self Care | Payer: Medicare Other | Attending: Family Medicine | Admitting: Family Medicine

## 2021-04-12 DIAGNOSIS — J0100 Acute maxillary sinusitis, unspecified: Secondary | ICD-10-CM | POA: Diagnosis not present

## 2021-04-12 DIAGNOSIS — R079 Chest pain, unspecified: Secondary | ICD-10-CM

## 2021-04-12 MED ORDER — AMOXICILLIN-POT CLAVULANATE 875-125 MG PO TABS: 1.0000 | ORAL_TABLET | Freq: Two times a day (BID) | ORAL | 0 refills | Status: AC

## 2021-04-12 MED ORDER — FLUTICASONE PROPIONATE 50 MCG/ACT NA SUSP: 2.0000 | Freq: Every day | NASAL | 2 refills | Status: DC

## 2021-04-12 NOTE — ED Provider Notes (Signed)
[place]    CSN: [id number] Arrival date & time: 04/12/21  1025      History   Chief Complaint Chief Complaint  Patient presents with   Nasal Congestion    HPI Jacqueline Hancock is a 81 y.o. female.   HPI Here for 2 week h/o postnasal dc, with hoarseness. Also has developed pleuritic cp on the right and has some pressure over her maxillary areas and behind her eyes.  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   TONSILLECTOMY AND ADENOIDECTOMY      OB History   No obstetric history on file.      Home Medications    Prior to Admission medications   Medication Sig Start Date End Date Taking? Authorizing Provider  amoxicillin-clavulanate (AUGMENTIN) 875-125 MG tablet Take 1 tablet by mouth 2 (two) times daily for 7 days. 04/12/21 04/19/21 Yes Kalyssa Anker, Gwenlyn Perking, MD  fluticasone (FLONASE) 50 MCG/ACT nasal spray Place 2 sprays into both nostrils daily. 04/12/21  Yes Barrett Henle, MD  Ascorbic Acid (VITAMIN C) 1000 MG tablet Take 1,000 mg  by mouth daily.    [provider]  calcium carbonate (OSCAL) 1500 (600 Ca) MG TABS tablet Take 500 mg by mouth daily.    [provider]  Cholecalciferol (VITAMIN D) 2000 UNITS tablet Take 4,000 Units by mouth daily.     [provider]  Ferrous Sulfate (IRON) 325 (65 Fe) MG TABS Take 325 mg by mouth daily.     [provider]  ibrutinib (IMBRUVICA) 560 MG TABS TAKE 1 TABLET (560 MG) BY MOUTH DAILY. 03/19/21 03/19/22  Wyatt Portela, MD  losartan (COZAAR) 50 MG tablet TAKE 1 TABLET(50 MG) BY MOUTH DAILY 10/20/20   Liane Comber, NP  nystatin (MYCOSTATIN) 100000 UNIT/ML suspension 5 ml four times a day, retain in mouth as long as possible (Swish and Spit).  Use for 48 hours after symptoms resolve. Patient taking differently: 5 ml four times a day, retain in mouth as long as possible (Swish and Spit).  Use for 48 hours after symptoms resolve. 09/23/20   Liane Comber, NP  olopatadine (PATANOL) 0.1 % ophthalmic solution Place 1 drop into both eyes 2 (two) times daily. 02/03/21   Magda Bernheim, NP  gabapentin (NEURONTIN) 100 MG capsule Take 1 capsule 3 x /day for Neuropathy Pain Patient not taking: Reported on 01/25/2020 08/02/19 01/25/20  Unk Pinto, MD    Family  History Family History  Problem Relation Age of Onset   Leukemia Mother    Hypertension Father    Diabetes Sister    Heart disease Brother    Diabetes Brother    Diabetes Brother     Social History Social History   Tobacco Use   Smoking status: Never   Smokeless tobacco: Never  Vaping Use   Vaping Use: Never used  Substance Use Topics   Alcohol use: No   Drug use: Never     Allergies   Nexium [esomeprazole magnesium]   Review of Systems Review of Systems   Physical Exam Triage Vital Signs ED Triage Vitals  Enc Vitals Group     BP 04/12/21 1152 (!) 183/78     Pulse Rate 04/12/21 1152 75     Resp 04/12/21 1152 20     Temp 04/12/21 1152 99.2 F (37.3 C)     Temp  Source 04/12/21 1152 Oral     SpO2 04/12/21 1152 96 %     Weight --      Height --      Head Circumference --      Peak Flow --      Pain Score 04/12/21 1153 0     Pain Loc --      Pain Edu? --      Excl. in [place]? --    No data found.  Updated Vital Signs BP (!) 183/78 (BP Location: Left Arm)    Pulse 75    Temp 99.2 F (37.3 C) (Oral)    Resp 20    SpO2 96%   Visual Acuity Right Eye Distance:   Left Eye Distance:   Bilateral Distance:    Right Eye Near:   Left Eye Near:    Bilateral Near:     Physical Exam Vitals reviewed.  Constitutional:      General: She is not in acute distress.    Appearance: She is not toxic-appearing.  HENT:     Right Ear: Tympanic membrane and ear canal normal.     Left Ear: Tympanic membrane and ear canal normal.     Nose: Nose normal.     Mouth/Throat:     Mouth: Mucous membranes are moist.     Pharynx: Oropharyngeal exudate (white and clear mucus draining in the posterior OP) present. No posterior oropharyngeal erythema.  Eyes:     Extraocular Movements: Extraocular movements intact.     Conjunctiva/sclera: Conjunctivae normal.     Pupils: Pupils are equal, round, and reactive to light.  Cardiovascular:     Rate and Rhythm: Normal rate and regular rhythm.     Heart sounds: No murmur heard. Pulmonary:     Effort: Pulmonary effort is normal. No respiratory distress.     Breath sounds: No wheezing, rhonchi or rales.  Chest:     Chest wall: No tenderness.  Musculoskeletal:     Cervical back: Neck supple.  Lymphadenopathy:     Cervical: No cervical adenopathy.  Skin:    Capillary Refill: Capillary refill takes less than 2 seconds.     Coloration: Skin is not jaundiced or pale.  Neurological:     General: No focal deficit present.     Mental Status: She is alert and oriented to person, place, and time.  Psychiatric:        Behavior: Behavior normal.     UC Treatments / Results  Labs (all labs ordered are listed, but only abnormal  results are displayed)  Labs Reviewed - No data to display  EKG   Radiology DG Chest 2 View  Result Date: 04/12/2021 CLINICAL DATA:  Chest pain. EXAM: CHEST - 2 VIEW COMPARISON:  October 25, 2010. FINDINGS: Stable cardiomediastinal silhouette. Both lungs are clear. The visualized skeletal structures are unremarkable. IMPRESSION: No active cardiopulmonary disease. Electronically Signed   By: Marijo Conception M.D.   On: 04/12/2021 12:33    Procedures Procedures (including critical care time)  Medications Ordered in UC Medications - No data to display  Initial Impression / Assessment and Plan / UC Course  I have reviewed the triage vital signs and the nursing notes.  Pertinent labs & imaging results that were available during my care of the patient were reviewed by me and considered in my medical decision making (see chart for details).     Will check cxr with her pleuritic cp and h/o lymphoma undergoing tx. Final Clinical Impressions(s) / UC Diagnoses   Final diagnoses:  Acute non-recurrent maxillary sinusitis     Discharge Instructions      Your chest xray was clear.   Take amox/clav antibiotic 875 mg 1 tab twice daily with food for 1 week Use flonase nasal spray, 2 sprays in each nostril twice daily for 2-3 weeks. Take mucinex or robitussin plain for the congestion  And use saline spray to rinse your nose.     ED Prescriptions     Medication Sig Dispense Auth. Provider   amoxicillin-clavulanate (AUGMENTIN) 875-125 MG tablet Take 1 tablet by mouth 2 (two) times daily for 7 days. 14 tablet Dax Murguia, Gwenlyn Perking, MD   fluticasone [facility]) 50 MCG/ACT nasal spray Place 2 sprays into both nostrils daily. 16 g Barrett Henle, MD      I have reviewed the PDMP during this encounter.   Barrett Henle, MD 04/12/21 1249

## 2021-04-12 NOTE — Discharge Instructions (Addendum)
Your chest xray was clear.   Take amox/clav antibiotic 875 mg 1 tab twice daily with food for 1 week Use flonase nasal spray, 2 sprays in each nostril twice daily for 2-3 weeks. Take mucinex or robitussin plain for the congestion  And use saline spray to rinse your nose.

## 2021-04-12 NOTE — ED Triage Notes (Signed)
Pt c/o post nasal drip with hoarse voice x2wks. States having rt side pain x2wks from when she was coughing.

## 2021-04-15 ENCOUNTER — Other Ambulatory Visit (HOSPITAL_COMMUNITY): Payer: Self-pay

## 2021-04-15 ENCOUNTER — Other Ambulatory Visit: Payer: Self-pay | Admitting: Oncology

## 2021-04-15 MED ORDER — IMBRUVICA 560 MG PO TABS
ORAL_TABLET | ORAL | 0 refills | Status: DC
  Filled 2021-04-15: qty 28, 28d supply, fill #0

## 2021-04-21 ENCOUNTER — Other Ambulatory Visit (HOSPITAL_COMMUNITY): Payer: Self-pay

## 2021-04-25 ENCOUNTER — Other Ambulatory Visit: Payer: Self-pay | Admitting: Adult Health

## 2021-04-25 DIAGNOSIS — I159 Secondary hypertension, unspecified: Secondary | ICD-10-CM

## 2021-05-13 ENCOUNTER — Other Ambulatory Visit (HOSPITAL_COMMUNITY): Payer: Self-pay

## 2021-05-13 ENCOUNTER — Other Ambulatory Visit: Payer: Self-pay | Admitting: Oncology

## 2021-05-13 MED ORDER — IBRUTINIB 560 MG PO TABS
ORAL_TABLET | ORAL | 0 refills | Status: DC
  Filled 2021-05-20 – 2021-05-21 (×2): qty 28, 28d supply, fill #0

## 2021-05-17 ENCOUNTER — Ambulatory Visit: Payer: Medicare Other | Admitting: Adult Health Nurse Practitioner

## 2021-05-20 ENCOUNTER — Other Ambulatory Visit (HOSPITAL_COMMUNITY): Payer: Self-pay

## 2021-05-21 ENCOUNTER — Other Ambulatory Visit (HOSPITAL_COMMUNITY): Payer: Self-pay

## 2021-05-26 ENCOUNTER — Telehealth: Payer: Self-pay

## 2021-05-26 NOTE — Telephone Encounter (Signed)
Oral Oncology Patient Advocate Encounter ° °Met patient in lobby to complete application for [employer] in an effort to reduce patient's out of pocket expense for Imbruvica to $0.   ° °Application completed and faxed to [phone].  ° °JJPAF patient assistance phone number for follow up is [phone].  ° °This encounter will be updated until final determination. ° °Elizabeth Tilley CPHT °Specialty Pharmacy Patient Advocate °[facility] °Phone [phone] °Fax [phone] °05/26/2021 9:04 AM ° °

## 2021-05-27 ENCOUNTER — Telehealth: Payer: Self-pay | Admitting: Internal Medicine

## 2021-05-27 NOTE — Chronic Care Management (AMB) (Signed)
°  Chronic Care Management   Outreach Note  05/27/2021 Name: Jacqueline Hancock MRN: [id number] DOB: [birth year]  Referred by: Unk Pinto, MD Reason for referral : No chief complaint on file.   An unsuccessful telephone outreach was attempted today. The patient was referred to the pharmacist for assistance with care management and care coordination.   Follow Up Plan:   Tatjana Dellinger Upstream Scheduler

## 2021-05-28 ENCOUNTER — Telehealth: Payer: Self-pay | Admitting: Internal Medicine

## 2021-05-28 NOTE — Chronic Care Management (AMB) (Signed)
°  Chronic Care Management   Note  05/28/2021 Name: MANMEET ARZOLA MRN: [id number] DOB: Aug 24, 1940  LILYBELLE MAYEDA is a 81 y.o. year old female who is a primary care patient of Unk Pinto, MD. I reached out to Devoria Glassing by phone today in response to a referral sent by Ms. Royetta Crochet Jacques's PCP, Unk Pinto, MD.   Ms. Erichsen was given information about Chronic Care Management services today including:  CCM service includes personalized support from designated clinical staff supervised by her physician, including individualized plan of care and coordination with other care providers 24/7 contact phone numbers for assistance for urgent and routine care needs. Service will only be billed when office clinical staff spend 20 minutes or more in a month to coordinate care. Only one practitioner may furnish and bill the service in a calendar month. The patient may stop CCM services at any time (effective at the end of the month) by phone call to the office staff.   Patient agreed to services and verbal consent obtained.   Follow up plan:   Tatjana Secretary/administrator

## 2021-06-04 NOTE — Telephone Encounter (Signed)
Patient is approved for Imbruvica at no cost from [employer] 05/26/21-04/03/22 ? ?JJPAF uses [facility] ? ?Wynn Maudlin CPHT ?Specialty Pharmacy Patient Advocate ?[place] ?Phone [phone] ?Fax [phone] ?06/04/2021 8:40 AM ? ?

## 2021-06-10 ENCOUNTER — Other Ambulatory Visit (HOSPITAL_COMMUNITY): Payer: Self-pay

## 2021-06-10 ENCOUNTER — Other Ambulatory Visit: Payer: Self-pay | Admitting: Oncology

## 2021-06-10 DIAGNOSIS — C8310 Mantle cell lymphoma, unspecified site: Secondary | ICD-10-CM

## 2021-06-10 MED ORDER — IBRUTINIB 560 MG PO TABS
ORAL_TABLET | ORAL | 0 refills | Status: DC
  Filled 2021-06-10: qty 28, 28d supply, fill #0

## 2021-06-11 ENCOUNTER — Inpatient Hospital Stay: Payer: Medicare Other | Attending: Oncology

## 2021-06-11 ENCOUNTER — Inpatient Hospital Stay: Payer: Medicare Other | Admitting: Oncology

## 2021-06-11 ENCOUNTER — Other Ambulatory Visit: Payer: Self-pay

## 2021-06-11 VITALS — BP 156/64 | HR 68 | Temp 97.6°F | Resp 18 | Ht 59.0 in | Wt 145.0 lb

## 2021-06-11 DIAGNOSIS — C8310 Mantle cell lymphoma, unspecified site: Secondary | ICD-10-CM | POA: Diagnosis not present

## 2021-06-11 DIAGNOSIS — K1230 Oral mucositis (ulcerative), unspecified: Secondary | ICD-10-CM | POA: Diagnosis not present

## 2021-06-11 DIAGNOSIS — D591 Autoimmune hemolytic anemia, unspecified: Secondary | ICD-10-CM | POA: Diagnosis not present

## 2021-06-11 DIAGNOSIS — Z79899 Other long term (current) drug therapy: Secondary | ICD-10-CM | POA: Diagnosis not present

## 2021-06-11 DIAGNOSIS — D649 Anemia, unspecified: Secondary | ICD-10-CM

## 2021-06-11 DIAGNOSIS — Z8572 Personal history of non-Hodgkin lymphomas: Secondary | ICD-10-CM | POA: Diagnosis not present

## 2021-06-11 LAB — CBC WITH DIFFERENTIAL (CANCER CENTER ONLY)
Abs Immature Granulocytes: 0.02 10*3/uL (ref 0.00–0.07)
Basophils Absolute: 0 10*3/uL (ref 0.0–0.1)
Basophils Relative: 1 %
Eosinophils Absolute: 0.1 10*3/uL (ref 0.0–0.5)
Eosinophils Relative: 1 %
HCT: 46.9 % — ABNORMAL HIGH (ref 36.0–46.0)
Hemoglobin: 15.2 g/dL — ABNORMAL HIGH (ref 12.0–15.0)
Immature Granulocytes: 0 %
Lymphocytes Relative: 37 %
Lymphs Abs: 2.9 10*3/uL (ref 0.7–4.0)
MCH: 28.4 pg (ref 26.0–34.0)
MCHC: 32.4 g/dL (ref 30.0–36.0)
MCV: 87.7 fL (ref 80.0–100.0)
Monocytes Absolute: 0.8 10*3/uL (ref 0.1–1.0)
Monocytes Relative: 10 %
Neutro Abs: 4.1 10*3/uL (ref 1.7–7.7)
Neutrophils Relative %: 51 %
Platelet Count: 115 10*3/uL — ABNORMAL LOW (ref 150–400)
RBC: 5.35 MIL/uL — ABNORMAL HIGH (ref 3.87–5.11)
RDW: 15.4 % (ref 11.5–15.5)
WBC Count: 8 10*3/uL (ref 4.0–10.5)
nRBC: 0 % (ref 0.0–0.2)

## 2021-06-11 LAB — CMP (CANCER CENTER ONLY)
ALT: 74 U/L — ABNORMAL HIGH (ref 0–44)
AST: 31 U/L (ref 15–41)
Albumin: 4.5 g/dL (ref 3.5–5.0)
Alkaline Phosphatase: 90 U/L (ref 38–126)
Anion gap: 9 (ref 5–15)
BUN: 20 mg/dL (ref 8–23)
CO2: 26 mmol/L (ref 22–32)
Calcium: 10.4 mg/dL — ABNORMAL HIGH (ref 8.9–10.3)
Chloride: 101 mmol/L (ref 98–111)
Creatinine: 1.11 mg/dL — ABNORMAL HIGH (ref 0.44–1.00)
GFR, Estimated: 50 mL/min — ABNORMAL LOW (ref >60)
Glucose, Bld: 79 mg/dL (ref 70–99)
Potassium: 4.7 mmol/L (ref 3.5–5.1)
Sodium: 136 mmol/L (ref 135–145)
Total Bilirubin: 2.1 mg/dL — ABNORMAL HIGH (ref 0.3–1.2)
Total Protein: 7 g/dL (ref 6.5–8.1)

## 2021-06-11 MED ORDER — LIDOCAINE VISCOUS HCL 2 % MT SOLN: 15.0000 mL | OROMUCOSAL | 1 refills | Status: DC | PRN

## 2021-06-11 NOTE — Progress Notes (Signed)
Hematology and Oncology Follow Up  ? ?Jacqueline Hancock ?[id number] ?04/07/1940 80 y.o. ?06/11/2021 2:52 PM ?Hancock, Jacqueline, MDMcKeown, William, MD  ? ? ? ? ? ?Principle Diagnosis: 80-year-old woman with mantle cell presented with a lymphocytosis and splenomegaly in December 2021.   ? ? 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 ? ? ?Prior Therapy:  ?She is status post bone marrow biopsy completed on March 05, 2020.  ? ?Prednisone 60 mg daily in the January 2022 to treat her hemolytic anemia.  This was tapered off after complete hematological response. ? ? ?Current therapy: Imbruvica 560 mg started on April 28, 2020. ? ?Interim History: Jacqueline Hancock returns today for repeat evaluation.  Since the last visit, she reports no major changes in her health.  For the last 2 weeks she has reported some mucositis and mouth irritation which has interfered with her ability to swallow.  She denied any nausea, vomiting or abdominal pain.  She denies any hospitalizations or illnesses.  Her performance status quality of life remains unchanged. ? ? ? ? ?Medications: Updated on review. ?Current Outpatient Medications  ?Medication Sig Dispense Refill  ? Ascorbic Acid (VITAMIN C) 1000 MG tablet Take 1,000 mg by mouth daily.    ? calcium carbonate (OSCAL) 1500 (600 Ca) MG TABS tablet Take 500 mg by mouth daily.    ? Cholecalciferol (VITAMIN D) 2000 UNITS tablet Take 4,000 Units by mouth daily.     ? Ferrous Sulfate (IRON) 325 (65 Fe) MG TABS Take 325 mg by mouth daily.     ? fluticasone (FLONASE) 50 MCG/ACT nasal spray Place 2 sprays into both nostrils daily. 16 g 2  ? ibrutinib (IMBRUVICA) 560 MG tablet TAKE 1 TABLET (560 MG) BY MOUTH DAILY. 28 tablet 0  ? losartan (COZAAR) 50 MG tablet TAKE 1 TABLET(50 MG) BY MOUTH DAILY 90 tablet 1  ? nystatin (MYCOSTATIN) 100000 UNIT/ML suspension 5 ml four times a day, retain in mouth as long as possible (Swish and Spit).  Use for 48 hours after symptoms  resolve. (Patient taking differently: 5 ml four times a day, retain in mouth as long as possible (Swish and Spit).  Use for 48 hours after symptoms resolve.) 80 mL 0  ? olopatadine (PATANOL) 0.1 % ophthalmic solution Place 1 drop into both eyes 2 (two) times daily. 5 mL 1  ? ?No current facility-administered medications for this visit.  ? ? ? ?Allergies:  ?Allergies  ?Allergen Reactions  ? Nexium [Esomeprazole Magnesium] Other (See Comments)  ?  Headache  ? ?Physical exam ? ? ? ?Blood pressure (!) 156/64, pulse 68, temperature 97.6 ?F (36.4 ?C), temperature source Temporal, resp. rate 18, height 4' 11" (1.499 m), weight 145 lb (65.8 kg), SpO2 98 %. ? ? ? ? ?ECOG 1 ? ? ?General appearance: Alert, awake without any distress. ?Head: Atraumatic without abnormalities ?Oropharynx: Mild erythema noted on the exterior lips and oral mucosa. ?Eyes: No scleral icterus. ?Lymph nodes: No lymphadenopathy noted in the cervical, supraclavicular, or axillary nodes ?Heart:regular rate and rhythm, without any murmurs or gallops.   ?Lung: Clear to auscultation without any rhonchi, wheezes or dullness to percussion. ?Abdomin: Soft, nontender without any shifting dullness or ascites. ?Musculoskeletal: No clubbing or cyanosis. ?Neurological: No motor or sensory deficits. ?Skin: No rashes or lesions. ? ? ? ? ? ? ? ? ?Lab Results: ?Lab Results  ?Component Value Date  ? WBC 5.5 02/11/2021  ? HGB 15.1 (H) 02/11/2021  ? HCT 45.2   02/11/2021  ? MCV 84.6 02/11/2021  ? PLT 106 (L) 02/11/2021  ? ?  Chemistry   ?   ?Component Value Date/Time  ? NA 137 02/11/2021 1426  ? K 4.7 02/11/2021 1426  ? CL 102 02/11/2021 1426  ? CO2 25 02/11/2021 1426  ? BUN 21 02/11/2021 1426  ? CREATININE 1.33 (H) 02/11/2021 1426  ? CREATININE 1.23 (H) 02/03/2021 1546  ?    ?Component Value Date/Time  ? CALCIUM 10.0 02/11/2021 1426  ? ALKPHOS 60 02/11/2021 1426  ? AST 22 03/25/2021 0854  ? AST 30 02/11/2021 1426  ? ALT 61 (H) 03/25/2021 0854  ? ALT 34 02/11/2021 1426  ?  BILITOT 1.3 (H) 03/25/2021 0854  ? BILITOT 1.9 (H) 02/11/2021 1426  ?  ? ? ? ? ? ? ?Impression and Plan: ? ? ?81 year old woman with: ? ?1.  Mantle cell lymphoma presented with lymphocytosis and splenomegaly in December 2021.   ? ?She continues to be in remission from her lymphoma on Imbruvica without any major complications.  Risks and benefits of continuing this treatment were discussed at this time.  Potential complications that include bleeding, cardiovascular complications were reiterated.  Alternative treatment options including systemic chemotherapy or switching to [place] were reiterated.  She is agreeable to continue at this time. ? ? ?2.  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  Her hemoglobin is back to normal today currently at 15.2. ? ?3.  Mucositis: Related to ibrutinib possibly.  We will send a prescription for viscous lidocaine with instructions to use it.  This is a mild case and will continue to monitor on subsequent visits. ? ? ?4.  Follow-up: In 4 months for repeat follow-up. ? ? ?30  minutes were spent on this visit.  The time was dedicated to reviewing laboratory data, disease status update, treatment choices and addressing complication related to her cancer and cancer therapy. ? ? ?Zola Button, MD 06/11/2021 2:52 PM ?

## 2021-06-15 ENCOUNTER — Other Ambulatory Visit (HOSPITAL_COMMUNITY): Payer: Self-pay

## 2021-06-28 ENCOUNTER — Other Ambulatory Visit: Payer: Self-pay | Admitting: *Deleted

## 2021-06-28 DIAGNOSIS — C8310 Mantle cell lymphoma, unspecified site: Secondary | ICD-10-CM

## 2021-06-28 MED ORDER — IBRUTINIB 560 MG PO TABS: ORAL_TABLET | ORAL | 0 refills | Status: DC

## 2021-07-19 ENCOUNTER — Telehealth: Payer: Self-pay

## 2021-07-19 NOTE — Telephone Encounter (Signed)
LM-Called pt. At [phone] and confirmed CPP office visit w/ pt. For 4/18 at 2:30PM. Completed precall questions. ? ?Total time spent: 9 min. ? ? ?Pre-Call Questions ?Are you able to connect with Patient: ?Yes ?Confirmed appointment date/time with patient/caregiver?: ?Yes ?Date/time of the appointment: ?07/20/21 2:30PM ?Visit type: ?Clinic ?Patient/Caregiver instructed to bring medications to appointment: ?Yes ?What, if any, problems do you have getting your medications from the pharmacy?: ?None ?What is your top health concern to discuss at your upcoming visit?: ?Sore toe right ingrown toenail. Started 2 weeks ago.  ? ?Have you seen any other providers since your last visit?: ?Yes ?Details: ?Visit #1: ?01/21/21-Shapiro, Lisbeth Ply, MD (Ophthalmology)- Pt. presented for an annual eye exam. No medication changes noted. ? ?Visit #2: ?01/27/21-Dillard, Naima (Obstetrics and Gynecology)- No information available. ? ?Visit #3: ?02/11/21-Shadad, Mathis Dad, MD (Oncology)- Pt. presented for f/u visit. No medication changes noted. ? ?Visit #4: ?02/15/21-Dillard, Naima (Obstetrics and Gynecology)- No information available. ? ?Visit #5: ?11/21/22Dillard, Naima (Obstetrics and Gynecology)- No information available. ? ?Visit #6: ?06/11/21-Shadad, Mathis Dad, MD (Oncology)- Pt. presented for repeat evaluation. ? ?STARTED ?Lidocaine HCl 2 % 15 mLs Mouth/Throat As needed ?

## 2021-07-20 ENCOUNTER — Ambulatory Visit: Payer: Medicare Other | Admitting: Pharmacy Technician

## 2021-07-20 ENCOUNTER — Other Ambulatory Visit: Payer: Self-pay | Admitting: *Deleted

## 2021-07-20 ENCOUNTER — Ambulatory Visit (INDEPENDENT_AMBULATORY_CARE_PROVIDER_SITE_OTHER): Payer: Medicare Other | Admitting: Internal Medicine

## 2021-07-20 ENCOUNTER — Encounter: Payer: Self-pay | Admitting: Internal Medicine

## 2021-07-20 VITALS — BP 157/80 | HR 70 | Temp 97.9°F | Resp 16 | Ht 59.0 in | Wt 148.4 lb

## 2021-07-20 DIAGNOSIS — E559 Vitamin D deficiency, unspecified: Secondary | ICD-10-CM

## 2021-07-20 DIAGNOSIS — E782 Mixed hyperlipidemia: Secondary | ICD-10-CM

## 2021-07-20 DIAGNOSIS — E669 Obesity, unspecified: Secondary | ICD-10-CM

## 2021-07-20 DIAGNOSIS — I159 Secondary hypertension, unspecified: Secondary | ICD-10-CM

## 2021-07-20 DIAGNOSIS — I10 Essential (primary) hypertension: Secondary | ICD-10-CM

## 2021-07-20 DIAGNOSIS — L600 Ingrowing nail: Secondary | ICD-10-CM

## 2021-07-20 DIAGNOSIS — B351 Tinea unguium: Secondary | ICD-10-CM | POA: Diagnosis not present

## 2021-07-20 DIAGNOSIS — C8310 Mantle cell lymphoma, unspecified site: Secondary | ICD-10-CM

## 2021-07-20 DIAGNOSIS — Z79899 Other long term (current) drug therapy: Secondary | ICD-10-CM

## 2021-07-20 DIAGNOSIS — R7309 Other abnormal glucose: Secondary | ICD-10-CM

## 2021-07-20 DIAGNOSIS — N1831 Chronic kidney disease, stage 3a: Secondary | ICD-10-CM

## 2021-07-20 MED ORDER — TRAMADOL HCL 50 MG PO TABS: ORAL_TABLET | ORAL | 0 refills | Status: DC

## 2021-07-20 MED ORDER — IBRUTINIB 560 MG PO TABS: ORAL_TABLET | ORAL | 1 refills | Status: DC

## 2021-07-20 MED ORDER — CEPHALEXIN 500 MG PO CAPS: ORAL_CAPSULE | ORAL | 0 refills | Status: DC

## 2021-07-20 NOTE — Progress Notes (Signed)
? ? ?Future Appointments  ?Date Time Provider Department  ?07/27/2021  2:30 PM Unk Pinto, MD [facility]  ?08/05/2021 10:30 AM Unk Pinto, MD [facility]  ?10/07/2021 10:30 AM Wyatt Portela, MD [facility]  ?02/03/2022  3:00 PM Magda Bernheim, NP [facility]  ? ? ?History of Present Illness: ? ? Patient is a very nice 81 yo  MBF with HTN who presents with a painful infected Rt 1st toe nail. BP is presumed elevated due to her pain.  ? ?Medications ? ?  losartan (COZAAR) 50 MG tablet, TAKE 1 TABLET DAILY ?  fluticasone (FLONASE) 50 MCG/ACT nasal spray, Place 2 sprays into both nostrils daily. ?  traMADol (ULTRAM) 50 MG tablet, Take 1 tablet every 4 hours as needed for Pain ?  Ferrous Sulfate (IRON) 325 (65 Fe) MG TABS, Take daily.  ?  Ascorbic Acid (VITAMIN C) 1000 MG tablet, Take daily. ?  calcium carbonate (OSCAL) 1500 (600 Ca) MG TABS tablet, Take 500 mg b daily. ?  Cholecalciferol (VITAMIN D) 2000 UNITS tablet, Take 4,000 Units by mouth daily.  ?  ibrutinib (IMBRUVICA) 560 MG tablet, TAKE 1 TABLET (DAILY. ?  lidocaine 2 % solution, Use as directed 15 mLs as needed for mouth pain. ?  nystatin (MYCOSTATIN) 100000 UNIT/ML suspension, 5 ml four times a day ?  olopatadine (PATANOL) 0.1 % ophth soln, Place 1 drop into both eyes 2 times daily. ? 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 ?  ?Observations/Objective: ? ?BP (!) 157/80   Pulse 70   Temp 97.9 ?F (36.6 ?C)   Resp 16   Ht '4\' 11"'$  (1.499 m)   Wt 148 lb 6.4 oz (67.3 kg)   SpO2 97%   BMI 29.97 kg/m?  ? ?HEENT - WNL. ?Neck - supple.  ?Chest - Clear equal BS. ?Cor - Nl HS. RRR w/o sig MGR. PP 1(+). No edema. ?MS-  FROM w/o deformities.  Gait Nl. ?Neuro -  Nl w/o focal abnormalities. ?Skin -  Noted all #10 toes appear thickened, chalky, dystrophic consistent with onychomycosis injection.  ?         -  Rt  Great toe has an apparent paronychia medial edge.  ? ?Procedure   (CPT  - 16109) ? ?   After informed consent & aseptic prep with alcohol the distal toe was anesthetized with sub-cut infiltration of  4 ml of Marcaine 0.5%  .  After adequate local anesthesia obtained, the nail was "pried "free with a hemostat  and  excised in toto. Then the nail margins were sharply trimmed of detritus & granulation tissue. Triple Ab Ung Applied and sterile bulk dressing. Patient instructed in post op wound care  ? ? ? ?Assessment and Plan: ? ?1. Essential hypertension ? ? ?2. Ingrown toenail of right foot with infection ( great toe)  ? ?- cephALEXin 500 MG capsule;  ?Take  1 capsule  3 x /day  with Meals  ? Dispense: 15 capsule ? ?3. Onychomycosis of toenail ? ?- traMADol  50 MG tablet;  ?Take 1 tablet every 4 hours as needed for Pain   ?Dispense: 30 tablet; ? ?Advised ROV 1 week to recheck.  ? ?Discussed starting Rx Lamisil at next OV.  ? ?Follow Up Instructions: ? ?  ?  I discussed the assessment and treatment plan with the patient. The patient was provided an opportunity to ask questions and all were answered. The patient agreed with the plan and demonstrated an understanding of the instructions. ?  ?    The patient was advised to call back or seek an in-person evaluation if the symptoms worsen or if the condition fails to improve as anticipated. ? ? ? ?Kirtland Bouchard, MD ? ? ?

## 2021-07-21 NOTE — Progress Notes (Signed)
Pharmacist Visit  ?IMONIE, TUCH [id number] ?3 years, Female  DOB: 09-21-1940  M: [phone] ?Care Team: Ander Gaster, Trudee Chirino  ?__________________________________________________ ?Chronic Conditions ?Patient's Chronic Conditions: Hypertension (HTN), Gastroesophageal Reflux Disease (GERD), Osteopenia or Osteoporosis, Chronic Kidney Disease (CKD), Hyperlipidemia/Dyslipidemia (HLD), Diabetes (DM), Other, Anemia ?List Other Conditions (separated by comma): Vitamin D deficiency, Prediabetes, Mantle cell lymphoma, Secondary autoimmune hemolytic anemia, Lymphoproliferative disoder, Obesity  ? ?Summary for PCP:  ?1. BP high at home and in office visits. >150/70. Consider increasing Losartan to '100mg'$  daily. ?2. LDL>70 and TG high. Consider Omega 3 '1000mg'$  daily or Crestor '5mg'$  once weekly. ?3. Decreased Calcium intake to '500mg'$  daily. ?4. Taking Vit D 5000u daily. ? ?Disease Assessments ?Current BP: 156/64 ?Current HR: 68 ?taken on: 06/11/2021 ?Previous BP: 159/66 ?Previous HR: 65 ?taken on: 02/11/2021 ?Weight: 145 ?BMI: 29.29 ?Last GFR: 50 ?taken on: 06/11/2021 ?Visit Completed on: 07/20/2021 ?Why did the patient present?: CCM Initial ?Marital status?: Widowed ?Details: Husband passed about a year ago ?What does the patient do during the day?: Like to be "on the go" ?Who does the patient spend their time with and what do they do?: Son, sister in law, friends ?Lifestyle habits such as diet and exercise?: No dietary restrictions/exercise at this time ?Alcohol, tobacco, and illicit drug usage?: None ?What is the patient?s sleep pattern?: No sleep issues ?How many hours per night does patient typically sleep?: 8+ ?Patient pleased with health care they are receiving?: Yes ?Name and location of Current pharmacy: [facility] ?Current Rx insurance plan: [employer] ?Are meds delivered by current pharmacy?: No - delivery not available ?Would patient benefit from direct intervention of clinical lead in dispensing process to optimize  clinical outcomes?: No ?Any additional demeanor/mood notes?: Very sweet lady presenting for CCM initial in office. Medications reviewed. On limited medications and she is very knowledgable about medications. ?SDOH: Accountable Health Communities Health-Related Social Needs Screening Tool ?([email or web address]) ?SDOH questions were documented and reviewed (EMR or [employer]) within the past 3 months?: No ?What is your living situation today? (ref #1): I have a steady place to live ?Think about the place you live. Do you have problems with any of the following? (ref #2): None of the above ?Within the past 12 months, you worried that your food would run out before you got money to buy more (ref #3): Never true ?Within the past 12 months, the food you bought just didn't last and you didn't have money to get more (ref #4): Never true ?In the past 12 months, has lack of reliable transportation kept you from medical appointments, meetings, work or from getting things needed for daily living? (ref #5): No ?In the past 12 months, has the electric, gas, oil, or water company threatened to shut off services in your home? (ref #6): No ?How often does anyone, including family and friends, physically hurt you? (ref #7): Never (1) ?How often does anyone, including family and friends, insult or talk down to you? (ref #8): Never (1) ?How often does anyone, including friends and family, threaten you with harm? (ref #9): Never (1) ?How often does anyone, including family and friends, scream or curse at you? (ref #10): Never (1) ?Hypertension (HTN) ?Discussed with patient today?: Yes ?Is patient able to obtain BP reading today?: Yes ?BP today is: 157/80 ?Heart Rate is: 70 ?Goal: <140/90 mmHG ?Hypertension Stage: Stage 2 (SBP >140 or DBP > 90) ?Is Patient checking BP at home?: Yes ?Pt. home BP readings are ranging: 150/70s ?How often does patient  miss taking their blood pressure  medications?: None ?Has patient experienced hypotension, dizziness, falls or bradycardia?: No ?Check present secondary causes (below) for HTN: CKD ?BP RPM device: Does patient qualify?: No ?We discussed: DASH diet:  following a diet emphasizing fruits and vegetables and low-fat dairy products along with whole grains, fish, poultry, and nuts. Reducing red meats and sugars., Reducing the amount of salt intake to '1500mg'$ /per day., Getting enough potassium in your diet equaling 3500-'5000mg'$ /day.  This helps to regulate BP by balancing out the effects of salt., Proper Home BP Measurement, Hypertension pathophysiology, complications, treatment goals, and management with patient for 10-15 minutes, Increasing movement, Contacting PCP office for signs and symptoms of high or low blood pressure (hypotension, dizziness, falls, headaches, edema) ?Assessment:: Uncontrolled ?Drug: Losartan '50mg'$  daily ?Assessment: Appropriate, Query Effectiveness ?Plan to Modify: Consider increase to '100mg'$  daily ?Plan to Review: Home BP readings with patient ?Hyperlipidemia/Dyslipidemia (HLD) ?Last Lipid panel on: 02/03/2021 ?TC (Goal<200): 192 ?LDL: 197 ?HDL (Goal>40): 57 ?TG (Goal<150): 168 ?ASCVD 10-year risk?is:: N/A due to Age > 69 ?Discussed with patient today?: Yes ?LDL Goal: <70 ?Has patient tried and failed any HLD Medications?: No ?Check present secondary causes (below) that can lead to increased cholesterol levels (multi-choice optional): CKD ?We discussed: Complications of hyperlipidemia and prevention methods with patient for 5-10 minutes, Increasing exercise (walking, biking, swimming) to a goal of 30 minutes per day, as able based on current activity level and health or as directed by your healthcare provider, How a diet high in fruits/vegetables/nuts/whole grains/beans may help to reduce your cholesterol. Increasing soluble fiber intake.  Avoiding sugary foods and trans fat, limiting carbohydrates, and reducing portion sizes.  Recommended increasing intake of healthy fats into their diet, Weight reduction- We discussed losing 5-10% of body weight ?Assessment:: Uncontrolled ?Drug: None ?Assessment: Query Appropriateness ?Plan to Start: Consider Omega 3 '1000mg'$  daily ?Plan to Counsel: Counseled on dietary and lifestyle modifications ?Pharmacist Follow up: PCP review on 08/05/21 ?Diabetes (DM) ?Current A1C: 5.0 ?taken on: 02/03/2021 ?Previous A1C: 4.5 ?taken on: 12/24/2019 ?Type: Pre-Diabetes/Impaired Fasting Glucose ?Discussed with patient today?: Yes ?Goal A1C: < 6.5 % ?Type: Pre-Diabetes/Impaired Fasting Glucose ?We discussed: Low carbohydrate eating plan with an emphasis on whole grains, legumes, nuts, fruits, and vegetables and minimal refined and processed foods., Proper diabetic foot care including daily foot exams and wearing closed toe shoes., Healthy eating habits for DM with patient for 27-03 minutes, Complications of Type 2 DM and preventative measures with patient for 5-10 minutes, Avoiding both sugar-sweetened and artificially (aspartame) sweetened beverages and increase water intake., Increasing exercise (walking, biking, swimming) to a goal of 30 minutes per day, as able based on current activity level and health, Scheduling a yearly eye exam, Scheduling a dental appointment twice yearly ?Assessment:: Controlled ?Drug: None ?Assessment: Query Appropriateness ?Pharmacist Follow up: Stable on dietary modifications ?Chronic kidney disease (CKD) ?Previous GFR: 50 ?taken on: 06/11/2021 ?The current microalbumin ratio is: 23 ?tested on: 02/03/2021 ?Discussed with patient today?: Yes ?CKD Stage: Stage 3a (GFR 45-60 mL/min) ?Albuminuria Stage: A1 (<30) ?Contributing factors for developing CKD: HTN ?Is Patient taking statin medication: No ?Is patient taking ACEi / ARB?: Yes ?Renal dose adjustments recommended?: No ?We discussed: Limiting dietary sodium intake to less than 2000 mg / day, Maintaining blood pressure control, Maintaining  blood glucose control, Avoidance of nephrotoxic drugs (NSAIDs) ?Plan to Start: Consider Statin once or twice weekly ?Plan to Review: Follow GFR ?Exercise, Diet and Non-Drug Coordination Needs ?Additional exercise cou

## 2021-07-21 NOTE — Telephone Encounter (Signed)
LM-07/21/21-Care plan created/modified based on pharmacist visit notes. Visit notes uploaded to pts. Documents in [employer]. ? ?Total time spent:14 min. ?

## 2021-07-27 ENCOUNTER — Ambulatory Visit: Payer: Medicare Other | Admitting: Internal Medicine

## 2021-07-27 ENCOUNTER — Encounter: Payer: Self-pay | Admitting: Internal Medicine

## 2021-07-27 VITALS — BP 178/78 | HR 62 | Temp 97.5°F | Ht 59.0 in | Wt 147.8 lb

## 2021-07-27 DIAGNOSIS — L600 Ingrowing nail: Secondary | ICD-10-CM

## 2021-07-27 DIAGNOSIS — B351 Tinea unguium: Secondary | ICD-10-CM

## 2021-07-27 NOTE — Progress Notes (Signed)
? ? ?Future Appointments  ?Date Time Provider Department  ?07/27/2021  2:30 PM Unk Pinto, MD [facility]  ?08/05/2021 10:30 AM Unk Pinto, MD [facility]  ?10/07/2021 10:00 AM [facility]  ?10/07/2021 10:30 AM Wyatt Portela, MD [facility]  ?02/03/2022  3:00 PM Magda Bernheim, NP [facility]  ? ? ?History of Present Illness: ? ?   Patient is a very nice 81 yo WBF returning for 1 week PO  f/u removal of  Rt.  1st toenail . ? ?Medications ? ? ?Current Outpatient Medications (Cardiovascular):  ?  losartan (COZAAR) 50 MG tablet, TAKE 1 TABLET(50 MG) BY MOUTH DAILY ? ?Current Outpatient Medications (Respiratory):  ?  fluticasone (FLONASE) 50 MCG/ACT nasal spray, Place 2 sprays into both nostrils daily. ? ?Current Outpatient Medications (Analgesics):  ?  traMADol (ULTRAM) 50 MG tablet, Take 1 tablet every 4 hours as needed for Pain ? ?Current Outpatient Medications (Hematological):  ?  Ferrous Sulfate (IRON) 325 (65 Fe) MG TABS, Take 325 mg by mouth daily.  ? ?Current Outpatient Medications (Other):  ?  Ascorbic Acid (VITAMIN C) 1000 MG tablet, Take 1,000 mg by mouth daily. ?  calcium carbonate (OSCAL) 1500 (600 Ca) MG TABS tablet, Take 500 mg by mouth daily. ?  cephALEXin (KEFLEX) 500 MG capsule, Take  1 capsule  3 x /day  with Meals for Infection ?  Cholecalciferol (VITAMIN D) 2000 UNITS tablet, Take 4,000 Units by mouth daily.  ?  ibrutinib (IMBRUVICA) 560 MG tablet, TAKE 1 TABLET (560 MG) BY MOUTH DAILY. ?  lidocaine (XYLOCAINE) 2 % solution, Use as directed 15 mLs in the mouth or throat as needed for mouth pain. ?  nystatin (MYCOSTATIN) 100000 UNIT/ML suspension, 5 ml four times a day, retain in mouth as long as possible (Swish and Spit).  Use for 48 hours after symptoms resolve. (Patient taking differently: 5 ml four times a day, retain in mouth as long as possible (Swish and Spit).  Use for 48 hours after symptoms resolve.) ?  olopatadine (PATANOL) 0.1 % ophthalmic solution, Place 1 drop into  both eyes 2 (two) times daily. ? 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 ?  ?Observations/Objective: ? ?BP (!) 178/78   Pulse 62   Temp (!) 97.5 ?F (36.4 ?C)   Ht '4\' 11"'$  (1.499 m)   Wt 147 lb 12.8 oz (67 kg)   SpO2 99%   BMI 29.85 kg/m?  ? ?Adherent gauze is gently removed from Rt 1st toenail bed. Antibiotic Ung & non adherent guaze is applied & secured with paper tape.  ? ?Discussed wound care :  daily dressing , Epsom salt soaks, and bandage as above.  ? ? ?Assessment and Plan: ? ?1. Ingrown toenail of right foot with infection ? ?2. Onychomycosis of toenail ? ? ?Follow Up Instructions: ? ?  ?    I discussed the assessment and treatment plan with the patient. The patient was provided an opportunity to ask questions and all were answered. The patient agreed with the plan and demonstrated an understanding of the instructions. ?  ?    The patient was advised to call back or seek an in-person evaluation if the symptoms worsen or if the condition fails to improve as anticipated. ? ? ? ?  Kirtland Bouchard, MD ? ? ?

## 2021-08-01 DIAGNOSIS — I159 Secondary hypertension, unspecified: Secondary | ICD-10-CM | POA: Diagnosis not present

## 2021-08-01 DIAGNOSIS — E669 Obesity, unspecified: Secondary | ICD-10-CM | POA: Diagnosis not present

## 2021-08-01 DIAGNOSIS — N1831 Chronic kidney disease, stage 3a: Secondary | ICD-10-CM | POA: Diagnosis not present

## 2021-08-01 DIAGNOSIS — E782 Mixed hyperlipidemia: Secondary | ICD-10-CM | POA: Diagnosis not present

## 2021-08-05 ENCOUNTER — Encounter: Payer: Self-pay | Admitting: Internal Medicine

## 2021-08-05 ENCOUNTER — Ambulatory Visit (INDEPENDENT_AMBULATORY_CARE_PROVIDER_SITE_OTHER): Payer: Medicare Other | Admitting: Internal Medicine

## 2021-08-05 VITALS — BP 158/80 | HR 70 | Temp 97.9°F | Resp 16 | Ht 59.0 in | Wt 148.8 lb

## 2021-08-05 DIAGNOSIS — E559 Vitamin D deficiency, unspecified: Secondary | ICD-10-CM | POA: Diagnosis not present

## 2021-08-05 DIAGNOSIS — Z79899 Other long term (current) drug therapy: Secondary | ICD-10-CM | POA: Diagnosis not present

## 2021-08-05 DIAGNOSIS — G250 Essential tremor: Secondary | ICD-10-CM | POA: Diagnosis not present

## 2021-08-05 DIAGNOSIS — I10 Essential (primary) hypertension: Secondary | ICD-10-CM | POA: Diagnosis not present

## 2021-08-05 DIAGNOSIS — R7309 Other abnormal glucose: Secondary | ICD-10-CM

## 2021-08-05 DIAGNOSIS — N1831 Chronic kidney disease, stage 3a: Secondary | ICD-10-CM | POA: Diagnosis not present

## 2021-08-05 DIAGNOSIS — D5919 Other autoimmune hemolytic anemia: Secondary | ICD-10-CM | POA: Diagnosis not present

## 2021-08-05 DIAGNOSIS — E782 Mixed hyperlipidemia: Secondary | ICD-10-CM

## 2021-08-05 DIAGNOSIS — C8310 Mantle cell lymphoma, unspecified site: Secondary | ICD-10-CM

## 2021-08-05 MED ORDER — NADOLOL 20 MG PO TABS: ORAL_TABLET | ORAL | 3 refills | Status: DC

## 2021-08-05 NOTE — Progress Notes (Signed)
? ?Future Appointments  ?Date Time Provider Department  ?08/05/2021                6 Mo OV  10:30 AM Unk Pinto, MD [facility]  ?10/07/2021 10:30 AM Wyatt Portela, MD [facility]  ?02/03/2022                CPE  3:00 PM Magda Bernheim, NP [facility]  ? ? ?History of Present Illness: ? ? ?    This very nice 81 y.o.  RH dominant  MBF  presents for 3 month follow up with HTN, HLD, Pre-Diabetes and Vitamin D Deficiency.  Patient's GERD is controlled with diet & her meds. Today patient is also c/o tremor in Lt >> Rt hand.  ? ? ?                                                  Patient is followed by Dr Alen Blew for [place] (03/2020) on Imbruvica.  ? ? ?    Patient is treated for HTN  since  2000 & BP has been controlled at home. Today?s BP is elevated  -  158/80. Patient haas CKD 3a (GFR 50) .  Patient has had no complaints of any cardiac type chest pain, palpitations, dyspnea / orthopnea / PND, dizziness, claudication, or dependent edema. ? ? ?    Hyperlipidemia is controlled with diet & meds. Patient denies myalgias or other med SE?s. Last Lipids were not at goal : ? ?Lab Results  ?Component Value Date  ? CHOL 192 02/03/2021  ? HDL 57 02/03/2021  ? LDLCALC 107 (H) 02/03/2021  ? TRIG 168 (H) 02/03/2021  ? CHOLHDL 3.4 02/03/2021  ? ? ? ?Also, the patient has history of PreDiabetes (A1c 5.8% / 2008) and has had no symptoms of reactive hypoglycemia, diabetic polys, paresthesias or visual blurring.  Last A1c was at goal : ? ?Lab Results  ?Component Value Date  ? HGBA1C 5.0 02/03/2021  ? ?    ? ?                                                  Further, the patient also has history of Vitamin D Deficiency ("36" /2016) and supplements vitamin D . Last vitamin D was near goal (70-100) : ? ? ?Lab Results  ?Component Value Date  ? VD25OH 55 02/03/2021  ? ? ? ?Current Outpatient Medications on File Prior to Visit  ?Medication Sig  ? VITAMIN C 1000 MG tablet Take daily.  ? OSCAL 600  MG  Take 500 mg daily.  ?  VITAMIN D  2000 UNITS  Take 4,000 Units daily.   ? IRON 325 (65 Fe) MG TABS Take 325 mg by mouth daily.   ? FLONASE nasal spray Place 2 sprays into both nostrils daily.  ? ibrutinib (IMBRUVICA) 560 MG tablet TAKE 1 TABLET (560 MG) DAILY.  ? losartan (COZAAR) 50 MG tablet TAKE 1 TABLET DAILY  ? PATANOL 0.1 % ophth soln Place 1 drop into both eyes 2 times daily.  ? ? ?Allergies  ?Allergen Reactions  ? Nexium [Esomeprazole Magnesium] Other (See Comments)  ?  Headache  ? ? ? ?  PMHx:   ?Past Medical History:  ?Diagnosis Date  ? Dyspnea 2021  ? with exersion   ? Early cataracts, bilateral   ? Elevated hemoglobin A1c   ? Fibroids   ? GERD (gastroesophageal reflux disease)   ? Hyperlipidemia   ? Hypertension   ? Osteopenia   ? ? ?Immunization History  ?Administered Date(s) Administered  ? Influenza, High Dose   01/10/2019, 02/03/2019, 02/05/2020, 02/03/2021  ? Influenza  01/29/2013, 12/27/2013, 01/25/2017  ? PFIZER SARS-COV-2 Vacc  05/11/2019, 06/03/2019  ? PPD Test 02/25/2013  ? Pneumococcal  -13 01/24/2017  ? Pneumococcal  -23 02/25/2013  ? Td 09/13/2001, 02/20/2012  ? Tdap 02/25/2013  ? Zoster, Live 10/23/2006  ? ? ?Past Surgical History:  ?Procedure Laterality Date  ? ABDOMINAL HYSTERECTOMY    ? APPENDECTOMY    ? CATARACT EXTRACTION, BILATERAL Bilateral 09/2017  ? Dr. Richardean Sale  ? PAROTID GLAND TUMOR EXCISION  2012  ? benign  ? TONSILLECTOMY AND ADENOIDECTOMY    ? ? ?FHx:    Reviewed / unchanged ? ?SHx:    Reviewed / unchanged  ? ?Systems Review: ? ?Constitutional: Denies fever, chills, wt changes, headaches, insomnia, fatigue, night sweats, change in appetite. ?Eyes: Denies redness, blurred vision, diplopia, discharge, itchy, watery eyes.  ?ENT: Denies discharge, congestion, post nasal drip, epistaxis, sore throat, earache, hearing loss, dental pain, tinnitus, vertigo, sinus pain, snoring.  ?CV: Denies chest pain, palpitations, irregular heartbeat, syncope, dyspnea, diaphoresis, orthopnea, PND, claudication or  edema. ?Respiratory: denies cough, dyspnea, DOE, pleurisy, hoarseness, laryngitis, wheezing.  ?Gastrointestinal: Denies dysphagia, odynophagia, heartburn, reflux, water brash, abdominal pain or cramps, nausea, vomiting, bloating, diarrhea, constipation, hematemesis, melena, hematochezia  or hemorrhoids. ?Genitourinary: Denies dysuria, frequency, urgency, nocturia, hesitancy, discharge, hematuria or flank pain. ?Musculoskeletal: Denies arthralgias, myalgias, stiffness, jt. swelling, pain, limping or strain/sprain.  ?Skin: Denies pruritus, rash, hives, warts, acne, eczema or change in skin lesion(s). ?Neuro: No weakness, tremor, incoordination, spasms, paresthesia or pain. ?Psychiatric: Denies confusion, memory loss or sensory loss. ?Endo: Denies change in weight, skin or hair change.  ?Heme/Lymph: No excessive bleeding, bruising or enlarged lymph nodes. ? ?Physical Exam ? ?BP (!) 158/80   Pulse 70   Temp 97.9 ?F (36.6 ?C)   Resp 16   Ht '4\' 11"'$  (1.499 m)   Wt 148 lb 12.8 oz (67.5 kg)   SpO2 97%   BMI 30.05 kg/m?  ? ?Appears  well nourished, well groomed  and in no distress. ? ?Eyes: PERRLA, EOMs, conjunctiva no swelling or erythema. ?Sinuses: No frontal/maxillary tenderness ?ENT/Mouth: EAC's clear, TM's nl w/o erythema, bulging. Nares clear w/o erythema, swelling, exudates. Oropharynx clear without erythema or exudates. Oral hygiene is good. Tongue normal, non obstructing. Hearing intact.  ?Neck: Supple. Thyroid not palpable. Car 2+/2+ without bruits, nodes or JVD. ?Chest: Respirations nl with BS clear & equal w/o rales, rhonchi, wheezing or stridor.  ?Cor: Heart sounds normal w/ regular rate and rhythm without sig. murmurs, gallops, clicks or rubs. Peripheral pulses normal and equal  without edema.  ?Abdomen: Soft & bowel sounds normal. Non-tender w/o guarding, rebound, hernias, masses or organomegaly.  ?Lymphatics: Unremarkable.  ?Musculoskeletal: Full ROM all peripheral extremities, joint stability, 5/5  strength and normal gait.  ?Skin: Warm, dry without exposed rashes, lesions or ecchymosis apparent.  ?Neuro: Cranial nerves intact, reflexes equal bilaterally. Sensory-motor testing grossly intact. Tendon reflexes grossly intact.  Has intermittent high frequency low amplitude tremor of   Left  non dominant hand.  ?Pysch: Alert & oriented x 3.  Insight  and judgement nl & appropriate. No ideations. ? ?Assessment and Plan: ? ?1. Essential hypertension ? ?-  Add Nadolol for BP & Tremor.  ? ?- Monitor blood pressure at home.  ?- Continue DASH diet.  Reminder to go to the ER if any CP,  ?SOB, nausea, dizziness, severe HA, changes vision/speech. ?   ?- CBC with Differential/Platelet ?- COMPLETE METABOLIC PANEL WITH GFR ?- Magnesium ?- TSH ? ?2. Hyperlipidemia, mixed ? ?- Continue diet/meds, exercise,& lifestyle modifications.  ?- Continue monitor periodic cholesterol/liver & renal functions  ?  ?- Lipid panel ?- TSH ? ?3. Abnormal glucose ? ?- Continue diet, exercise  ?- Lifestyle modifications.  ?- Monitor appropriate labs ?   ?- Hemoglobin A1c ?- Insulin, random ? ?4. Vitamin D deficiency ? ?- Continue supplementation ?  ?- VITAMIN D 25 Hydroxy  ? ?5. Stage 3a chronic kidney disease ([place]) ? ?- Hemoglobin A1c ? ?6. Mantle cell lymphoma  ([place]) ? ?- CBC with Differential/Platelet ? ?7. 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 ?- CBC with Differential/Platelet ? ?8.  Essential  Tremor  ? ?- Add Nadolol for BP & Tremor ? ?9. Medication management ? ?- CBC with Differential/Platelet ?- COMPLETE METABOLIC PANEL WITH GFR ?- Magnesium ?- Lipid panel ?- TSH ?- Hemoglobin A1c ?- Insulin, random ?- VITAMIN D 25 Hydroxy ? ? ?      Discussed  regular exercise, BP monitoring, weight control to achieve/maintain BMI less than 25 and discussed med and SE's. Recommended labs to assess /monitor clinical status .  I discussed the assessment and treatment plan with the patient. The patient was provided an  opportunity to ask questions and all were answered. The patient agreed with the plan and demonstrated an understanding of the instructions.  I provided over 30 minutes of exam, counseling, chart review and  complex critical deci

## 2021-08-05 NOTE — Patient Instructions (Signed)

## 2021-08-06 LAB — COMPLETE METABOLIC PANEL WITH GFR
AG Ratio: 2 (calc) (ref 1.0–2.5)
ALT: 16 U/L (ref 6–29)
AST: 29 U/L (ref 10–35)
Albumin: 4.7 g/dL (ref 3.6–5.1)
Alkaline phosphatase (APISO): 50 U/L (ref 37–153)
BUN/Creatinine Ratio: 22 (calc) (ref 6–22)
BUN: 27 mg/dL — ABNORMAL HIGH (ref 7–25)
CO2: 28 mmol/L (ref 20–32)
Calcium: 10.7 mg/dL — ABNORMAL HIGH (ref 8.6–10.4)
Chloride: 99 mmol/L (ref 98–110)
Creat: 1.25 mg/dL — ABNORMAL HIGH (ref 0.60–0.95)
Globulin: 2.3 g/dL (calc) (ref 1.9–3.7)
Glucose, Bld: 80 mg/dL (ref 65–99)
Potassium: 5.1 mmol/L (ref 3.5–5.3)
Sodium: 138 mmol/L (ref 135–146)
Total Bilirubin: 1.5 mg/dL — ABNORMAL HIGH (ref 0.2–1.2)
Total Protein: 7 g/dL (ref 6.1–8.1)
eGFR: 43 mL/min/{1.73_m2} — ABNORMAL LOW (ref >=60)

## 2021-08-06 LAB — CBC WITH DIFFERENTIAL/PLATELET
Absolute Monocytes: 708 cells/uL (ref 200–950)
Basophils Absolute: 37 cells/uL (ref 0–200)
Basophils Relative: 0.5 %
Eosinophils Absolute: 88 cells/uL (ref 15–500)
Eosinophils Relative: 1.2 %
HCT: 45.9 % — ABNORMAL HIGH (ref 35.0–45.0)
Hemoglobin: 15 g/dL (ref 11.7–15.5)
Lymphs Abs: 2811 cells/uL (ref 850–3900)
MCH: 28.8 pg (ref 27.0–33.0)
MCHC: 32.7 g/dL (ref 32.0–36.0)
MCV: 88.3 fL (ref 80.0–100.0)
MPV: 12.7 fL — ABNORMAL HIGH (ref 7.5–12.5)
Monocytes Relative: 9.7 %
Neutro Abs: 3657 cells/uL (ref 1500–7800)
Neutrophils Relative %: 50.1 %
Platelets: 102 10*3/uL — ABNORMAL LOW (ref 140–400)
RBC: 5.2 10*6/uL — ABNORMAL HIGH (ref 3.80–5.10)
RDW: 13.2 % (ref 11.0–15.0)
Total Lymphocyte: 38.5 %
WBC: 7.3 10*3/uL (ref 3.8–10.8)

## 2021-08-06 LAB — HEMOGLOBIN A1C
Hgb A1c MFr Bld: 5.2 % of total Hgb (ref <5.7)
Mean Plasma Glucose: 103 mg/dL
eAG (mmol/L): 5.7 mmol/L

## 2021-08-06 LAB — LIPID PANEL
Cholesterol: 217 mg/dL — ABNORMAL HIGH (ref <200)
HDL: 69 mg/dL (ref >=50)
LDL Cholesterol (Calc): 116 mg/dL (calc) — ABNORMAL HIGH
Non-HDL Cholesterol (Calc): 148 mg/dL (calc) — ABNORMAL HIGH (ref <130)
Total CHOL/HDL Ratio: 3.1 (calc) (ref <5.0)
Triglycerides: 205 mg/dL — ABNORMAL HIGH (ref <150)

## 2021-08-06 LAB — MAGNESIUM: Magnesium: 2.3 mg/dL (ref 1.5–2.5)

## 2021-08-06 LAB — TSH: TSH: 3.72 mIU/L (ref 0.40–4.50)

## 2021-08-06 LAB — INSULIN, RANDOM: Insulin: 30.8 u[IU]/mL — ABNORMAL HIGH

## 2021-08-06 LAB — VITAMIN D 25 HYDROXY (VIT D DEFICIENCY, FRACTURES): Vit D, 25-Hydroxy: 68 ng/mL (ref 30–100)

## 2021-08-07 NOTE — Progress Notes (Signed)
<><><><><><><><><><><><><><><><><><><><><><><><><><><><><><><><><> ?<><><><><><><><><><><><><><><><><><><><><><><><><><><><><><><><><> ? ?-   Total Chol = ?217 ? is too high  ??? ? ? ? ( ?Ideal or Goal is less than 180 ?! ?)  ?& ?- Bad /Dangerous LDL Chol =116  - Also  too high  ??? ? ? ? ?( ?Ideal or Goal is less than 70 ?! ?)  ?? ? ?- Recommend ?a stricter plant based low cholesterol diet  ? ?- Cholesterol only comes from animal sources  ? ?- ie. meat, dairy, egg yolks ? ? ?- Eat all the vegetables you want. ? ?- Avoid meat, Avoid meat,  Avoid meat, specially red meat - Beef AND Pork . ? ?- Avoid cheese & dairy - milk & ice cream. ? ? ?- Cheese is the most concentrated form of trans-fats which  ?is the worst thing to clog up our arteries.  ? ?- Veggie cheese is OK which can be found in the fresh  ?produce section at [employer] or [employer] or [employer] ?<><><><><><><><><><><><><><><><><><><><><><><><><><><><><><><><><> ? ?-  A1c - Normal  - No Diabetes  - Great  ! ?<><><><><><><><><><><><><><><><><><><><><><><><><><><><><><><><><> ? ?- Vitamin D = 68   - Excellent  - Please keep dose Same  ?<><><><><><><><><><><><><><><><><><><><><><><><><><><><><><><><><> ? ?- Kidney functions are Stable  - Great ! ? ?- All Else - CBC - Electrolytes - Liver - Magnesium & Thyroid   ? ?- all  Normal / OK ?<><><><><><><><><><><><><><><><><><><><><><><><><><><><><><><><><> ?<><><><><><><><><><><><><><><><><><><><><><><><><><><><><><><><><> ? ?- Keep up the [place] Work  ! ? ?<><><><><><><><><><><><><><><><><><><><><><><><><><><><><><><><><> ?<><><><><><><><><><><><><><><><><><><><><><><><><><><><><><><><><> ? ? ?

## 2021-08-27 ENCOUNTER — Other Ambulatory Visit: Payer: Self-pay | Admitting: Adult Health

## 2021-08-27 DIAGNOSIS — I159 Secondary hypertension, unspecified: Secondary | ICD-10-CM

## 2021-09-09 ENCOUNTER — Other Ambulatory Visit: Payer: Self-pay | Admitting: *Deleted

## 2021-09-09 DIAGNOSIS — C8310 Mantle cell lymphoma, unspecified site: Secondary | ICD-10-CM

## 2021-09-09 MED ORDER — IBRUTINIB 560 MG PO TABS: ORAL_TABLET | ORAL | 1 refills | Status: DC

## 2021-09-13 ENCOUNTER — Other Ambulatory Visit: Payer: Self-pay | Admitting: *Deleted

## 2021-09-13 ENCOUNTER — Other Ambulatory Visit (HOSPITAL_COMMUNITY): Payer: Self-pay

## 2021-09-13 ENCOUNTER — Telehealth: Payer: Self-pay | Admitting: *Deleted

## 2021-09-13 DIAGNOSIS — C8310 Mantle cell lymphoma, unspecified site: Secondary | ICD-10-CM

## 2021-09-13 MED ORDER — IBRUTINIB 280 MG PO TABS: 280.0000 mg | ORAL_TABLET | Freq: Every day | ORAL | 0 refills | Status: DC

## 2021-09-13 MED ORDER — IBRUTINIB 560 MG PO TABS: ORAL_TABLET | ORAL | 1 refills | Status: DC

## 2021-09-13 NOTE — Telephone Encounter (Signed)
-----   Message from Boyce Medici, [facility] sent at 09/13/2021  3:00 PM EDT ----- Hello! I just spoke to our buyer and it looks like they have completely recalled the entire '560mg'$  tablet strength. Please send in a new prescription using the '280mg'$  tabs as these are still available.  Thanks, Katie ----- Message ----- From: Rolene Course, RN Sent: 09/13/2021   2:42 PM EDT To: Wyatt Portela, MD; Tollie Pizza, CPhT; #  I sent in a refill request for this patient's Imbruvica 560 mg today & also called [facility] because the patient told me she took her last tablet today.  According to [employer] [facility], the 560 mg dose is no longer available.  Please advise.  Thanks, Bethena Roys

## 2021-09-13 NOTE — Telephone Encounter (Signed)
PC to patient, informed her Imbruvica 560 mg tablets are no longer available & a new rx for 280 mg tablets has been sent to her pharmacy.  Instructed patient to take two 280 mg tablets daily.  Per [facility], her medication will be shipped to her tomorrow.  Patient verbalizes understanding.

## 2021-10-01 ENCOUNTER — Other Ambulatory Visit: Payer: Self-pay

## 2021-10-01 DIAGNOSIS — C8310 Mantle cell lymphoma, unspecified site: Secondary | ICD-10-CM

## 2021-10-01 MED ORDER — IBRUTINIB 280 MG PO TABS: 280.0000 mg | ORAL_TABLET | Freq: Every day | ORAL | 0 refills | Status: DC

## 2021-10-01 NOTE — Telephone Encounter (Signed)
Received fax from [facility] requesting refill of Imbruvica 280 mg tablets. E-scribed.

## 2021-10-07 ENCOUNTER — Inpatient Hospital Stay: Payer: Medicare Other | Attending: Oncology

## 2021-10-07 ENCOUNTER — Other Ambulatory Visit: Payer: Self-pay

## 2021-10-07 ENCOUNTER — Inpatient Hospital Stay: Payer: Medicare Other | Admitting: Oncology

## 2021-10-07 DIAGNOSIS — D72820 Lymphocytosis (symptomatic): Secondary | ICD-10-CM | POA: Diagnosis not present

## 2021-10-07 DIAGNOSIS — Z8572 Personal history of non-Hodgkin lymphomas: Secondary | ICD-10-CM | POA: Diagnosis not present

## 2021-10-07 DIAGNOSIS — D591 Autoimmune hemolytic anemia, unspecified: Secondary | ICD-10-CM | POA: Diagnosis not present

## 2021-10-07 DIAGNOSIS — C8310 Mantle cell lymphoma, unspecified site: Secondary | ICD-10-CM

## 2021-10-07 DIAGNOSIS — R161 Splenomegaly, not elsewhere classified: Secondary | ICD-10-CM | POA: Insufficient documentation

## 2021-10-07 DIAGNOSIS — K1230 Oral mucositis (ulcerative), unspecified: Secondary | ICD-10-CM | POA: Diagnosis not present

## 2021-10-07 DIAGNOSIS — D649 Anemia, unspecified: Secondary | ICD-10-CM

## 2021-10-07 LAB — CBC WITH DIFFERENTIAL (CANCER CENTER ONLY)
Abs Immature Granulocytes: 0.12 10*3/uL — ABNORMAL HIGH (ref 0.00–0.07)
Basophils Absolute: 0 10*3/uL (ref 0.0–0.1)
Basophils Relative: 0 %
Eosinophils Absolute: 0.1 10*3/uL (ref 0.0–0.5)
Eosinophils Relative: 1 %
HCT: 44.8 % (ref 36.0–46.0)
Hemoglobin: 15 g/dL (ref 12.0–15.0)
Immature Granulocytes: 2 %
Lymphocytes Relative: 33 %
Lymphs Abs: 2.3 10*3/uL (ref 0.7–4.0)
MCH: 29.8 pg (ref 26.0–34.0)
MCHC: 33.5 g/dL (ref 30.0–36.0)
MCV: 88.9 fL (ref 80.0–100.0)
Monocytes Absolute: 1.1 10*3/uL — ABNORMAL HIGH (ref 0.1–1.0)
Monocytes Relative: 16 %
Neutro Abs: 3.3 10*3/uL (ref 1.7–7.7)
Neutrophils Relative %: 48 %
Platelet Count: 106 10*3/uL — ABNORMAL LOW (ref 150–400)
RBC: 5.04 MIL/uL (ref 3.87–5.11)
RDW: 13 % (ref 11.5–15.5)
WBC Count: 6.9 10*3/uL (ref 4.0–10.5)
nRBC: 0 % (ref 0.0–0.2)

## 2021-10-07 LAB — CMP (CANCER CENTER ONLY)
ALT: 14 U/L (ref 0–44)
AST: 26 U/L (ref 15–41)
Albumin: 4.3 g/dL (ref 3.5–5.0)
Alkaline Phosphatase: 43 U/L (ref 38–126)
Anion gap: 7 (ref 5–15)
BUN: 25 mg/dL — ABNORMAL HIGH (ref 8–23)
CO2: 26 mmol/L (ref 22–32)
Calcium: 9.9 mg/dL (ref 8.9–10.3)
Chloride: 104 mmol/L (ref 98–111)
Creatinine: 1.37 mg/dL — ABNORMAL HIGH (ref 0.44–1.00)
GFR, Estimated: 39 mL/min — ABNORMAL LOW (ref >60)
Glucose, Bld: 82 mg/dL (ref 70–99)
Potassium: 4.3 mmol/L (ref 3.5–5.1)
Sodium: 137 mmol/L (ref 135–145)
Total Bilirubin: 1.5 mg/dL — ABNORMAL HIGH (ref 0.3–1.2)
Total Protein: 6.6 g/dL (ref 6.5–8.1)

## 2021-10-07 MED ORDER — IBRUTINIB 280 MG PO TABS: ORAL_TABLET | ORAL | 3 refills | Status: DC

## 2021-10-07 NOTE — Progress Notes (Signed)
Hematology and Oncology Follow Up   Jacqueline Hancock [id number] [birth year] 81 y.o. 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  She was found to have lymphocytosis, anemia and splenomegaly.     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   Prior Therapy:  She is status post bone marrow biopsy completed on March 05, 2020.   Prednisone 60 mg daily in the January 2022 to treat her hemolytic anemia.  This was tapered off after complete hematological response.   Current therapy: Imbruvica 560 mg started on April 28, 2020.  Interim History: Jacqueline Hancock is here for a follow-up visit.  Since the last visit, she reports no major changes in her health.  She has reported to have more complications related to Imbruvica at this time.  She has had irritation in the side of her mouth and excessive bleeding from toe injury.  She denies any nausea, vomiting or palpitation.  She does report increase in her blood pressure.     Medications: Reviewed and updated. Current Outpatient Medications  Medication Sig Dispense Refill   Ascorbic Acid (VITAMIN C) 1000 MG tablet Take 1,000 mg by mouth daily.     calcium carbonate (OSCAL) 1500 (600 Ca) MG TABS tablet Take 500 mg by mouth daily.     cephALEXin (KEFLEX) 500 MG capsule Take  1 capsule  3 x /day  with Meals for Infection (Patient not taking: Reported on 08/05/2021) 15 capsule 0   Cholecalciferol (VITAMIN D) 2000 UNITS tablet Take 4,000 Units by mouth daily.      Ferrous Sulfate (IRON) 325 (65 Fe) MG TABS Take 325 mg by mouth daily.      fluticasone (FLONASE) 50 MCG/ACT nasal spray Place 2 sprays into both nostrils daily. 16 g 2   ibrutinib (IMBRUVICA) 280 MG tablet Take 1 tablet (280 mg total) by mouth daily. Take two tablets daily with a glass of water. 56 tablet 0   lidocaine (XYLOCAINE) 2 % solution Use  as directed 15 mLs in the mouth or throat as needed for mouth pain. (Patient not taking: Reported on 07/27/2021) 100 mL 1   losartan (COZAAR) 50 MG tablet TAKE 1 TABLET(50 MG) BY MOUTH DAILY 90 tablet 1   nadolol (CORGARD) 20 MG tablet Take  1 tablet  Daily  for BP & Tremor 90 tablet 3   nystatin (MYCOSTATIN) 100000 UNIT/ML suspension 5 ml four times a day, retain in mouth as long as possible (Swish and Spit).  Use for 48 hours after symptoms resolve. (Patient not taking: Reported on 07/27/2021) 80 mL 0   olopatadine (PATANOL) 0.1 % ophthalmic solution Place 1 drop into both eyes 2 (two) times daily. 5 mL 1   traMADol (ULTRAM) 50 MG tablet Take 1 tablet every 4 hours as needed for Pain (Patient not taking: Reported on 07/27/2021) 30 tablet 0   No current facility-administered medications for this visit.     Allergies:  Allergies  Allergen Reactions   Nexium [Esomeprazole Magnesium] Other (See Comments)    Headache   Physical exam     Blood pressure (!) 171/76, pulse (!) 58, temperature 97.7 F (36.5 C), temperature source Temporal, resp. rate 16, height 4' 11"  (1.499 m), weight 152 lb 11.2 oz (69.3 kg), SpO2 100 %.     ECOG 1    General appearance: Comfortable appearing without any discomfort Head: Normocephalic  without any trauma Oropharynx: Mucous membranes are moist and pink without any thrush or ulcers. Eyes: Pupils are equal and round reactive to light. Lymph nodes: No cervical, supraclavicular, inguinal or axillary lymphadenopathy.   Heart:regular rate and rhythm.  S1 and S2 without leg edema. Lung: Clear without any rhonchi or wheezes.  No dullness to percussion. Abdomin: Soft, nontender, nondistended with good bowel sounds.  No hepatosplenomegaly. Musculoskeletal: No joint deformity or effusion.  Full range of motion noted. Neurological: No deficits noted on motor, sensory and deep tendon reflex exam. Skin: No petechial rash or dryness.  Appeared moist.            Lab Results: Lab Results  Component Value Date   WBC 7.3 08/05/2021   HGB 15.0 08/05/2021   HCT 45.9 (H) 08/05/2021   MCV 88.3 08/05/2021   PLT 102 (L) 08/05/2021     Chemistry      Component Value Date/Time   NA 138 08/05/2021 1017   K 5.1 08/05/2021 1017   CL 99 08/05/2021 1017   CO2 28 08/05/2021 1017   BUN 27 (H) 08/05/2021 1017   CREATININE 1.25 (H) 08/05/2021 1017      Component Value Date/Time   CALCIUM 10.7 (H) 08/05/2021 1017   ALKPHOS 90 06/11/2021 1427   AST 29 08/05/2021 1017   AST 31 06/11/2021 1427   ALT 16 08/05/2021 1017   ALT 74 (H) 06/11/2021 1427   BILITOT 1.5 (H) 08/05/2021 1017   BILITOT 2.1 (H) 06/11/2021 1427          Impression and Plan:   81 year old woman with:  1.  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  She presented with autoimmune hemolytic anemia, lymphocytosis and splenomegaly.  She achieved a complete response to ibrutinib. .    The natural course of her disease was reviewed at this time.  Treatment choices were discussed.  She continues to tolerate improvement with excellent hematological response at this time.  Complications include cardiac issues, bleeding, and others were reiterated.  After discussion today, I opted to decrease her dose by 50% given that so the few complications and the excellent response she is experiencing.   2.  Autoimmune hemolytic anemia: Resolved at this time with normalization of her hemoglobin.   3.  Mucositis: Improved at this time and I anticipate further improvement with dose reduction.   4.  Follow-up: In 4 months for repeat evaluation.   30  minutes were spent on this encounter.  The time was dedicated to reviewing laboratory data, disease status update and outlining future plan of care discussion.   Zola Button, MD 10/07/2021 9:46 AM

## 2021-10-11 ENCOUNTER — Other Ambulatory Visit: Payer: Self-pay | Admitting: *Deleted

## 2021-10-11 DIAGNOSIS — C8310 Mantle cell lymphoma, unspecified site: Secondary | ICD-10-CM

## 2021-10-11 MED ORDER — IBRUTINIB 280 MG PO TABS: ORAL_TABLET | ORAL | 3 refills | Status: DC

## 2021-10-26 DIAGNOSIS — M79671 Pain in right foot: Secondary | ICD-10-CM | POA: Diagnosis not present

## 2021-10-26 DIAGNOSIS — L600 Ingrowing nail: Secondary | ICD-10-CM | POA: Diagnosis not present

## 2021-11-01 ENCOUNTER — Telehealth: Payer: Self-pay | Admitting: Oncology

## 2021-11-01 NOTE — Telephone Encounter (Signed)
Called patient regarding upcoming November appointments, patient is notified. 

## 2021-11-09 DIAGNOSIS — T8189XA Other complications of procedures, not elsewhere classified, initial encounter: Secondary | ICD-10-CM | POA: Diagnosis not present

## 2022-01-13 ENCOUNTER — Telehealth: Payer: Self-pay

## 2022-01-13 NOTE — Patient Outreach (Signed)
  Care Coordination   01/13/2022 Name: Jacqueline Hancock MRN: [id number] DOB: 1940/06/03   Care Coordination Outreach Attempts:  An unsuccessful telephone outreach was attempted today to offer the patient information about available care coordination services as a benefit of their health plan.   Follow Up Plan:  Additional outreach attempts will be made to offer the patient care coordination information and services.   Encounter Outcome:  No Answer  Care Coordination Interventions Activated:  No   Care Coordination Interventions:  No, not indicated    Jone Baseman, RN, MSN [facility] Care Management Care Management Coordinator Direct Line [phone]

## 2022-01-15 IMAGING — CT CT CHEST-ABD-PELV W/ CM
2 of 5 series · 12 of 36 positions shown, 14 images · IV contrast (omnipaque)
Comparison: CT the abdomen and pelvis 05/07/2007.

CLINICAL DATA: 79-year-old female with history of lymphocytosis
concerning for potential lymphoproliferative disorder.

EXAM:
CT CHEST, ABDOMEN, AND PELVIS WITH CONTRAST
TECHNIQUE: Multidetector CT imaging of the chest, abdomen and pelvis was
performed following the standard protocol during bolus
administration of intravenous contrast.
CONTRAST:  100mL OMNIPAQUE IOHEXOL 300 MG/ML  SOLN

[Series 2: cap with · axial · 0.86mm/px · z∈[-582,-82]mm · 9 of 126 slices shown, 11 images]
[im 13/126  mediastinal]
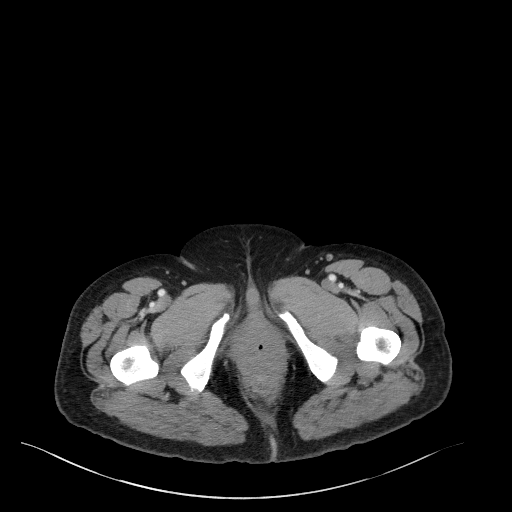
[im 13/126  bone]
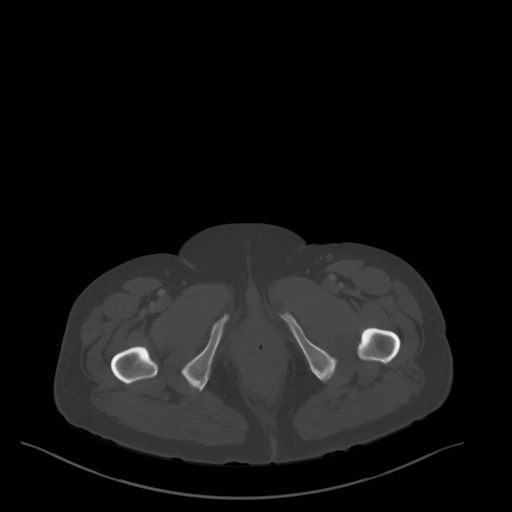
[im 26/126  mediastinal]
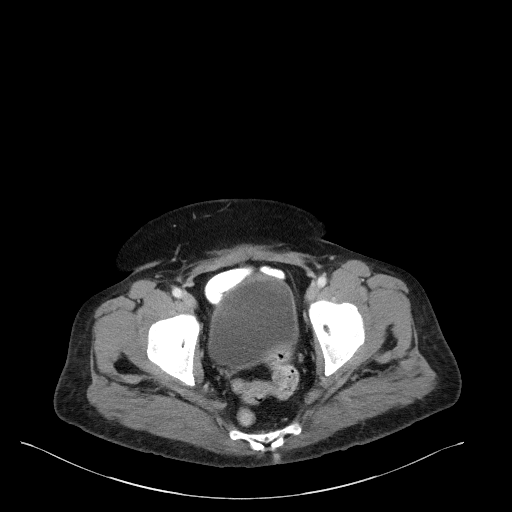
[im 38/126  mediastinal]
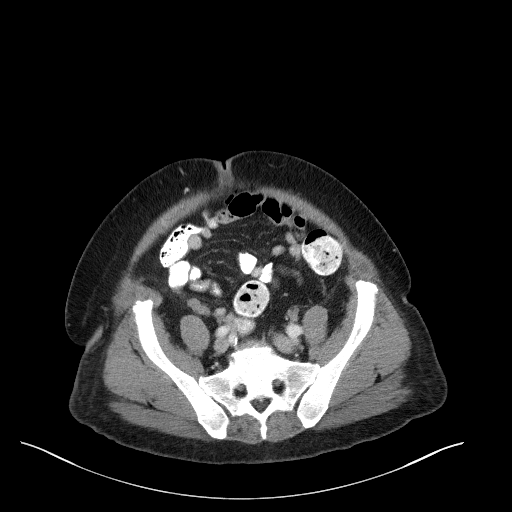
[im 51/126  mediastinal]
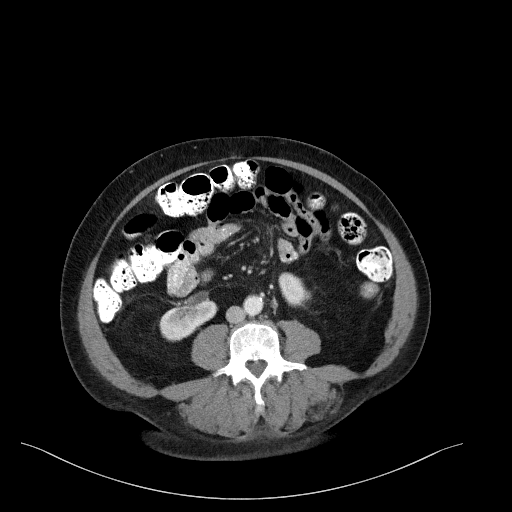
[im 63/126  mediastinal]
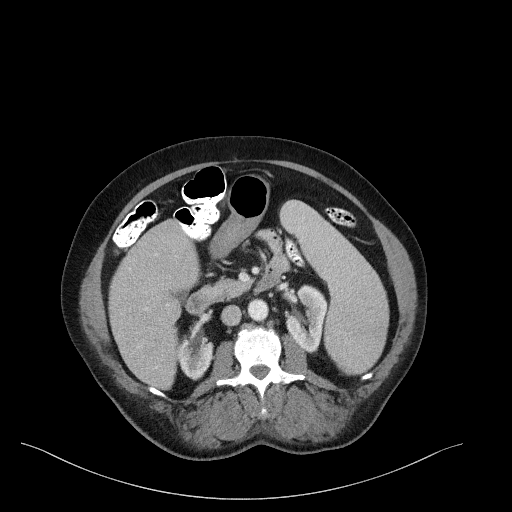
[im 76/126  mediastinal]
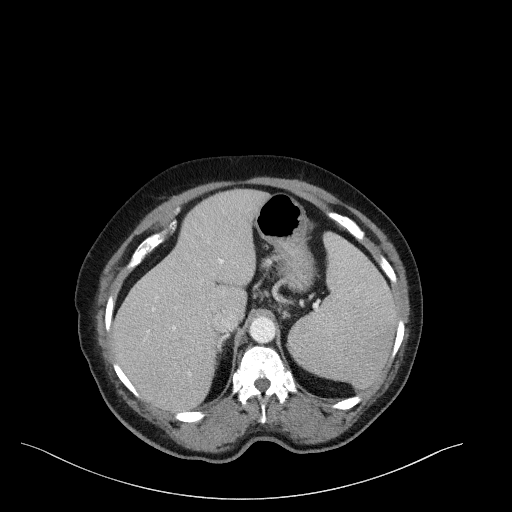
[im 88/126  mediastinal]
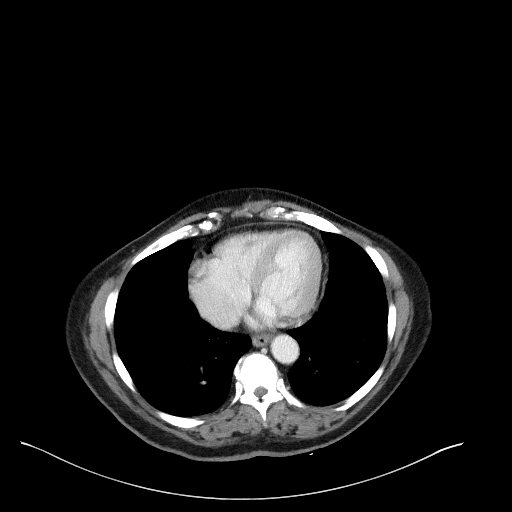
[im 101/126  mediastinal]
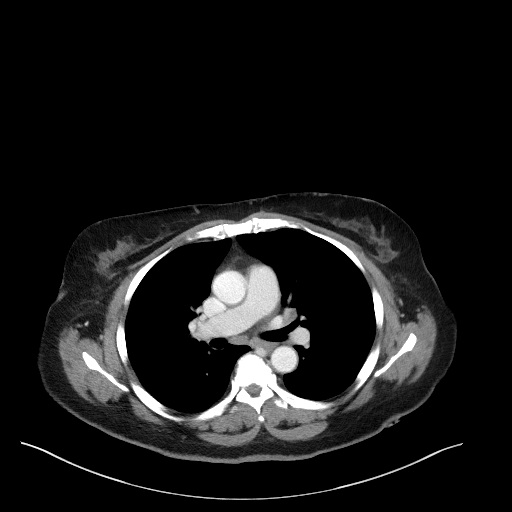
[im 113/126  mediastinal]
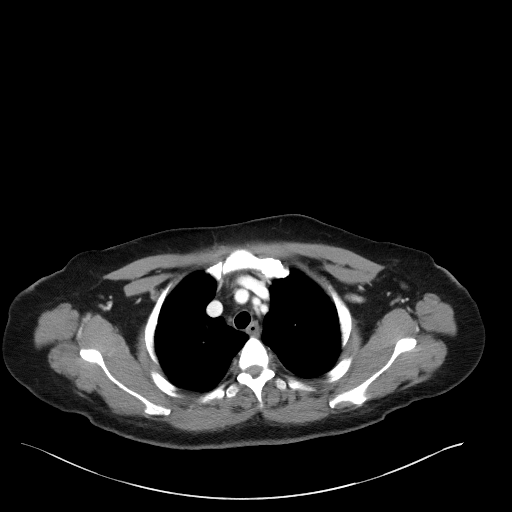
[im 113/126  bone]
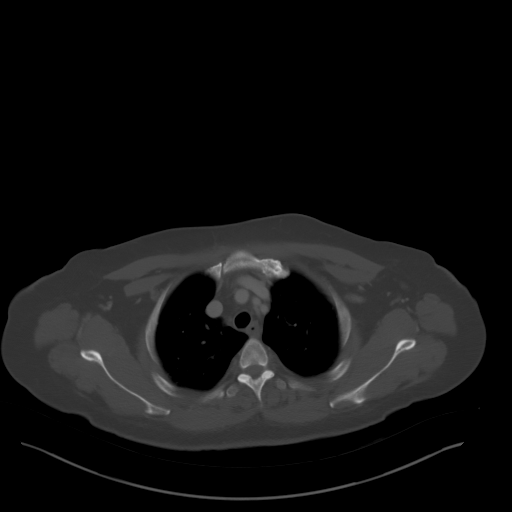

[Series 4: coronals · coronal · 0.71mm/px · 3 of 151 slices shown]
[im 31/151  mediastinal]
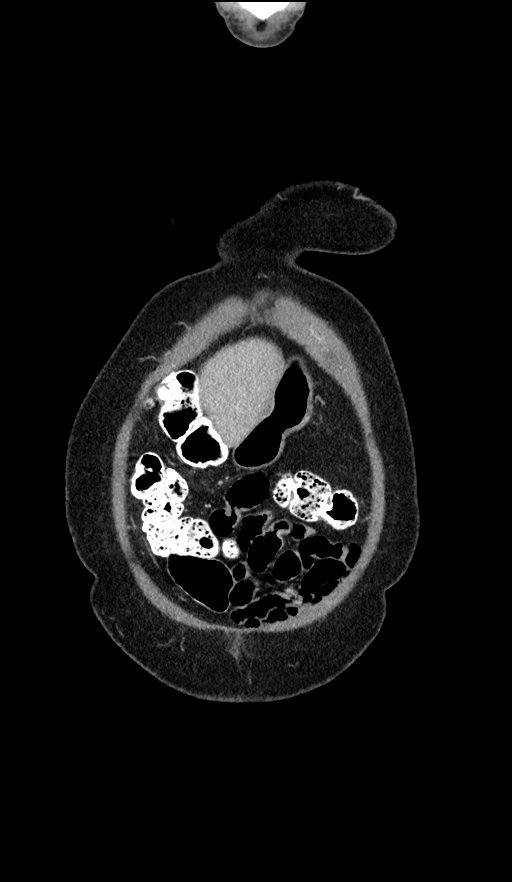
[im 61/151  mediastinal]
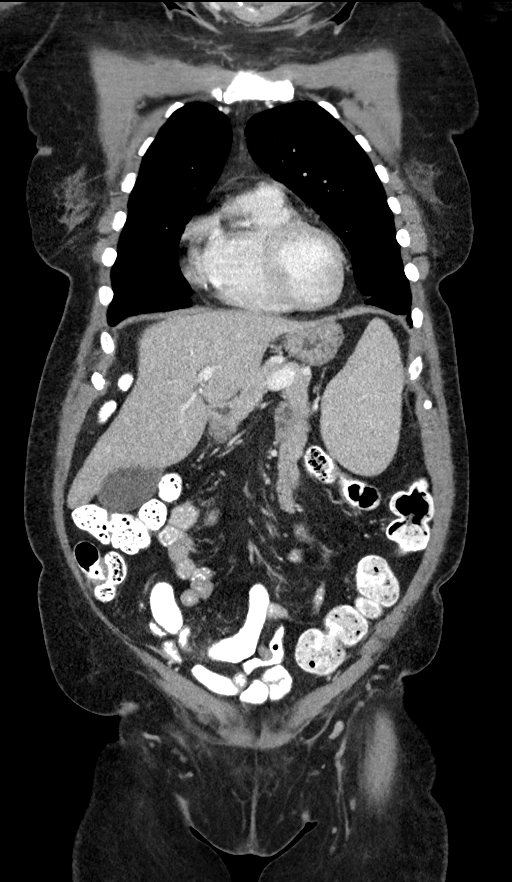
[im 91/151  mediastinal]
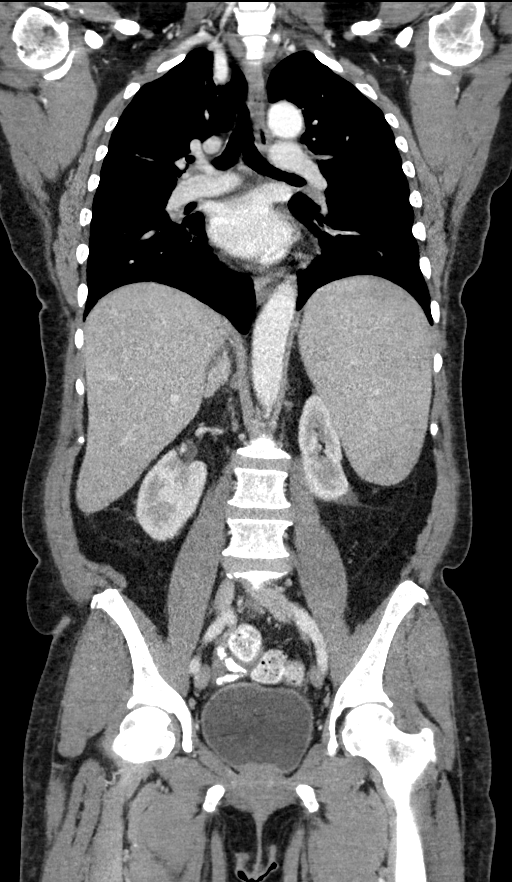

[12 of 36 positions shown; findings below may reference images not displayed]

FINDINGS: CT CHEST FINDINGS

Cardiovascular: Heart size is normal. There is no significant
pericardial fluid, thickening or pericardial calcification. No
atherosclerotic calcifications in the thoracic aorta or the coronary
arteries.

Mediastinum/Nodes: No pathologically enlarged mediastinal or hilar
lymph nodes. Esophagus is unremarkable in appearance. No axillary
lymphadenopathy.

Lungs/Pleura: Multiple tiny pulmonary nodules measuring 4 mm or less
in size scattered throughout the lungs bilaterally. Small calcified
granuloma in the periphery of the left lower lobe incidentally
noted. No other larger more suspicious appearing pulmonary nodules
or masses are noted. No acute consolidative airspace disease. No
pleural effusions.

Musculoskeletal: There are no aggressive appearing lytic or blastic
lesions noted in the visualized portions of the skeleton.

CT ABDOMEN PELVIS FINDINGS

Hepatobiliary: No suspicious cystic or solid hepatic lesions. No
intra or extrahepatic biliary ductal dilatation. Gallbladder is
normal in appearance.

Pancreas: No pancreatic mass. No pancreatic ductal dilatation. No
pancreatic or peripancreatic fluid collections or inflammatory
changes.

Spleen: Spleen is enlarged measuring 16.5 x 6.9 x 14.9 cm (estimated
splenic volume of 848 mL) .

Adrenals/Urinary Tract: 2 mm nonobstructive calculus in the
interpolar collecting system of the right kidney and 5 mm
nonobstructive calculus in the upper pole collecting system of left
kidney. Altered right renal axis (normal anatomical variant)
incidentally noted. No suspicious renal lesions. No
hydroureteronephrosis. Urinary bladder is normal in appearance.

Stomach/Bowel: The appearance of the stomach is normal. There is no
pathologic dilatation of small bowel or colon. The appendix is not
confidently identified and may be surgically absent. Regardless,
there are no inflammatory changes noted adjacent to the cecum to
suggest the presence of an acute appendicitis at this time.

Vascular/Lymphatic: Aortic atherosclerosis, without evidence of
aneurysm or dissection in the abdominal or pelvic vasculature. No
lymphadenopathy noted in the abdomen or pelvis.

Reproductive: Status post hysterectomy.  Ovaries are atrophic.

Other: No significant volume of ascites.  No pneumoperitoneum.

Musculoskeletal: There are no aggressive appearing lytic or blastic
lesions noted in the visualized portions of the skeleton.
IMPRESSION: 1. Splenomegaly.
2. No lymphadenopathy noted in the chest, abdomen or pelvis.
3. Multiple tiny pulmonary nodules scattered throughout the lungs
bilaterally measuring 4 mm or less in size, nonspecific, but
statistically likely benign. No follow-up needed if patient is
low-risk (and has no known or suspected primary neoplasm).
Non-contrast chest CT can be considered in 12 months if patient is
high-risk. This recommendation follows the consensus statement:
Guidelines for Management of Incidental Pulmonary Nodules Detected
[DATE].
4. Nonobstructive calculi are present within the collecting systems
of both kidneys measuring up to 5 mm in the upper pole collecting
system of the left kidney. No ureteral stones or findings of urinary
tract obstruction are noted at this time.
5. Aortic atherosclerosis.

## 2022-01-24 DIAGNOSIS — H524 Presbyopia: Secondary | ICD-10-CM | POA: Diagnosis not present

## 2022-01-24 DIAGNOSIS — Z961 Presence of intraocular lens: Secondary | ICD-10-CM | POA: Diagnosis not present

## 2022-01-28 ENCOUNTER — Telehealth: Payer: Self-pay

## 2022-01-28 NOTE — Patient Outreach (Signed)
  Care Coordination   Initial Visit Note   01/28/2022 Name: Jacqueline Hancock MRN: [id number] DOB: 1940-05-24  Jacqueline Hancock is a 80 y.o. year old female who sees Unk Pinto, MD for primary care. I spoke with  Devoria Glassing by phone today.  What matters to the patients health and wellness today?  Health Maintenance    Goals Addressed             This Visit's Progress    Health Maintenance       Care Coordination Interventions: Patient interviewed about adult health maintenance status including  Depression screen    Discussed Annual wellness visit and importance Provided education about Hypertension Management          SDOH assessments and interventions completed:  Yes     Care Coordination Interventions Activated:  Yes  Care Coordination Interventions:  Yes, provided   Follow up plan: Follow up call scheduled for January    Encounter Outcome:  Pt. Visit Completed    Jone Baseman, RN, MSN [place] [facility] Care Management Coordinator Direct Line [phone]

## 2022-01-28 NOTE — Patient Instructions (Signed)
Visit Information  Thank you for taking time to visit with me today. Please don't hesitate to contact me if I can be of assistance to you.   Following are the goals we discussed today:   Goals Addressed             This Visit's Progress    Health Maintenance       Care Coordination Interventions: Patient interviewed about adult health maintenance status including  Depression screen    Discussed Annual wellness visit and importance Provided education about Hypertension Management          Our next appointment is by telephone on 04/29/22 at 10 am  Please call the care guide team at [phone] if you need to cancel or reschedule your appointment.   If you are experiencing a Mental Health or [place] or need someone to talk to, please call the Suicide and Crisis Lifeline: 988   The patient verbalized understanding of instructions, educational materials, and care plan provided today and agreed to receive a mailed copy of patient instructions, educational materials, and care plan.   Telephone follow up appointment with care management team member scheduled for: January  Jakota Manthei J Nashawn Hillock, RN, MSN [place] Care Management Coordinator Direct Line [phone]

## 2022-02-02 NOTE — Progress Notes (Unsigned)
Complete Physical  Assessment / Plan   Jacqueline Hancock was seen today for annual exam.  Diagnoses and all orders for this visit:  Encounter for general adult medical examination with abnormal findings Yearly  Essential hypertension Continue current medications: Losartan 50 mg QD Monitor blood pressure at home; call if consistently over 130/80 Continue DASH diet.   Reminder to go to the ER if any CP, SOB, nausea, dizziness, severe HA, changes vision/speech, left arm numbness and tingling and jaw pain. -     CBC with Differential/Platelet -     COMPLETE METABOLIC PANEL WITH GFR -     TSH -     EKG 12-Lead   Hyperlipidemia, unspecified hyperlipidemia type Controlled with diet and exercise Discussed dietary and exercise modifications Low fat diet -     Lipid panel  Gastroesophageal reflux disease, unspecified whether esophagitis present Doing well at this time Diet discussed Monitor for triggers Avoid food with high acid content Avoid excessive cafeine Increase water intake   Vitamin D deficiency Continue supplementation to maintain goal of 70-100 Taking Vitamin D 5,000 IU daily -     VITAMIN D 25 Hydroxy (Vit-D Deficiency, Fractures)  Osteopenia, unspecified location DEXA UTD, Q2 Follows with GYN  Stage 3 chronic kidney disease, unspecified whether stage 3a or 3b CKD Increase fluids  Avoid NSAIDS Blood pressure control Monitor sugars  Will continue to monitor -     Urinalysis w microscopic + reflex cultur - Microalbumin/creatinine urine ratio   Abnormal glucose Discussed dietary and exercise modifications -     Hemoglobin A1c  Flu Vaccine Need High dose flu vaccine given  Obesity Discussed dietary and exercise modifications  Medication management Magnesium  Mantle cell lymphoma  Autoimmune hemolytic anemia Continue Ibrutinib Continue to follow with Dr. Osker Mason, oncology  Diastasis recti To monitor if she develops severe abdominal pain with fever, vomiting  or nausea go to the ER  Screening for ischemic heart disease - EKG  Screening for hematuria/proteinuria - Routine UA with reflex microscopic - Microalbumin/creatinine urine ratio  Screening for thyroid disordere - TSH  Screening for AAA - ABD [place] retroperitoneal LTD   Over 40 minutes of face to face interview, exam, counseling, chart review, and critical decision making was performed Future Appointments  Date Time Provider [place]  02/03/2022  3:00 PM Alycia Rossetti, NP [facility] None  02/10/2022  2:30 PM [facility] None  02/10/2022  3:00 PM Wyatt Portela, MD [facility] None  04/29/2022 10:00 AM Jon Billings, RN [facility] None      Subjective:   Jacqueline Hancock is a 81 y.o.  female who presents for CPE and 3 month follow up on HTN, HLD, prediabetes/abnormal glucose, vitamin D def.   She has been having red, watery eyes and associated right frontal and maxillary sinus pain, feels congested and is having post nasal drip. She does use Allegra daily , has used this same antibiotic for a long time.  BMI is There is no height or weight on file to calculate BMI., she is working on diet and exercise. Wt Readings from Last 3 Encounters:  10/07/21 152 lb 11.2 oz (69.3 kg)  08/05/21 148 lb 12.8 oz (67.5 kg)  07/27/21 147 lb 12.8 oz (67 kg)   She has been having dry itchy skin x several months.  Denies rashes.  She has not been checking her blood pressure at home, today their BP is   She does workout, does it in her house. She denies  chest pain, shortness of breath, dizziness.  She is not on cholesterol medication . Her cholesterol is not at goal. The cholesterol last visit was:   Lab Results  Component Value Date   CHOL 217 (H) 08/05/2021   HDL 69 08/05/2021   LDLCALC 116 (H) 08/05/2021   TRIG 205 (H) 08/05/2021   CHOLHDL 3.1 08/05/2021   She has been working on diet and exercise for prediabetes, and denies paresthesia of the feet, polydipsia,  polyuria and visual disturbances. Last A1C in the office was:  Lab Results  Component Value Date   HGBA1C 5.2 08/05/2021   Lab Results  Component Value Date   GFRAA 52 (L) 08/07/2020   Patient is on Vitamin D supplement. Lab Results  Component Value Date   VD25OH 68 08/05/2021      Medication Review Current Outpatient Medications on File Prior to Visit  Medication Sig Dispense Refill   Ascorbic Acid (VITAMIN C) 1000 MG tablet Take 1,000 mg by mouth daily.     calcium carbonate (OSCAL) 1500 (600 Ca) MG TABS tablet Take 500 mg by mouth daily.     cephALEXin (KEFLEX) 500 MG capsule Take  1 capsule  3 x /day  with Meals for Infection (Patient not taking: Reported on 08/05/2021) 15 capsule 0   Cholecalciferol (VITAMIN D) 2000 UNITS tablet Take 4,000 Units by mouth daily.      Ferrous Sulfate (IRON) 325 (65 Fe) MG TABS Take 325 mg by mouth daily.      fluticasone (FLONASE) 50 MCG/ACT nasal spray Place 2 sprays into both nostrils daily. 16 g 2   ibrutinib (IMBRUVICA) 280 MG tablet Take one tablet daily with a glass of water. 56 tablet 3   lidocaine (XYLOCAINE) 2 % solution Use as directed 15 mLs in the mouth or throat as needed for mouth pain. (Patient not taking: Reported on 07/27/2021) 100 mL 1   losartan (COZAAR) 50 MG tablet TAKE 1 TABLET(50 MG) BY MOUTH DAILY 90 tablet 1   nadolol (CORGARD) 20 MG tablet Take  1 tablet  Daily  for BP & Tremor 90 tablet 3   nystatin (MYCOSTATIN) 100000 UNIT/ML suspension 5 ml four times a day, retain in mouth as long as possible (Swish and Spit).  Use for 48 hours after symptoms resolve. (Patient not taking: Reported on 07/27/2021) 80 mL 0   olopatadine (PATANOL) 0.1 % ophthalmic solution Place 1 drop into both eyes 2 (two) times daily. 5 mL 1   traMADol (ULTRAM) 50 MG tablet Take 1 tablet every 4 hours as needed for Pain (Patient not taking: Reported on 07/27/2021) 30 tablet 0   [DISCONTINUED] gabapentin (NEURONTIN) 100 MG capsule Take 1 capsule 3 x /day for  Neuropathy Pain (Patient not taking: Reported on 01/25/2020) 270 capsule 0   No current facility-administered medications on file prior to visit.    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 [facility]- here for primary care 2.  Dr. Gershon Crane, eye doctor, 2020, due for 2021 3. Has partial, dentist, last visit 2 years  Patient Care Team: Unk Pinto, MD as PCP - General (Internal Medicine) Jodi Marble, MD as Consulting Physician (Otolaryngology) Richmond Campbell, MD as Consulting Physician (Gastroenterology) Leo Grosser, Seymour Bars, MD (Inactive) as Consulting Physician (Obstetrics and Gynecology) Rutherford Guys, MD as Consulting Physician (Ophthalmology) Newton Pigg, [facility] (Inactive) as Pharmacist (Pharmacist) Jon Billings, RN as [place] [facility]  Allergies Allergies  Allergen Reactions   Nexium [Esomeprazole Magnesium] Other (See Comments)    Headache    SURGICAL HISTORY She  has a past surgical history that includes Parotid gland tumor excision (2012); Abdominal hysterectomy; Appendectomy; Tonsillectomy and adenoidectomy; and Cataract extraction, bilateral (Bilateral, 09/2017). FAMILY HISTORY Her family history includes Diabetes in her brother, brother, and sister; Heart disease in her brother; Hypertension in her father; Leukemia in her mother. SOCIAL HISTORY She  reports that she has  never smoked. She has never used smokeless tobacco. She reports that she does not drink alcohol and does not use drugs.  Review of Systems  Constitutional:  Negative for chills and fever.  HENT:  Positive for congestion and ear pain. Negative for hearing loss, sinus pain, sore throat and tinnitus.   Eyes:  Positive for redness. Negative for blurred vision and double vision.  Respiratory:  Negative for cough, hemoptysis, sputum production, shortness of breath and wheezing.   Cardiovascular:  Negative for chest pain, palpitations and leg swelling.  Gastrointestinal:  Negative for abdominal pain, constipation, diarrhea, heartburn, nausea and vomiting.  Genitourinary:  Negative for dysuria and urgency.  Musculoskeletal:  Negative for back pain, falls, joint pain, myalgias and neck pain.  Skin:  Positive for itching (dry skin). Negative for rash.  Neurological:  Negative for dizziness, tingling, tremors, weakness and headaches.  Endo/Heme/Allergies:  Does not bruise/bleed easily.  Psychiatric/Behavioral:  Negative for depression and suicidal ideas. The patient is not nervous/anxious and does not have insomnia.       Objective:   There were no vitals filed for this visit.   There is no height or weight on file to calculate BMI.  General appearance: alert, no distress, WD/WN,  female HEENT: normocephalic, sclerae anicteric, TMs pearly, nares patent, no discharge or erythema, pharynx normal Oral cavity: MMM, no lesions Neck: supple, no lymphadenopathy, no thyromegaly, no masses Heart: RRR, normal S1, S2, no murmurs Lungs: CTA bilaterally, no wheezes, rhonchi, or rales Abdomen: +bs, soft, + epigastric tender, non distended, no masses, no hepatomegaly, no splenomegaly. Abdomen  Musculoskeletal: nontender, no swelling, no obvious deformity Extremities: no edema, no cyanosis, no clubbing Pulses: 2+ symmetric, upper and lower extremities, normal cap refill Neurological: alert, oriented x 3,  CN2-12 intact, strength normal upper extremities and lower extremities, sensation normal throughout, DTRs 2+ throughout, no cerebellar signs, gait normal Psychiatric: normal affect, behavior normal, pleasant  EKG: NSR, no ST changes Breast: defer Gyn: defer Rectal: defer  EKG: NSR  [facility]  02/02/2022

## 2022-02-03 ENCOUNTER — Encounter: Payer: Self-pay | Admitting: Nurse Practitioner

## 2022-02-03 ENCOUNTER — Ambulatory Visit (INDEPENDENT_AMBULATORY_CARE_PROVIDER_SITE_OTHER): Payer: Medicare Other | Admitting: Nurse Practitioner

## 2022-02-03 VITALS — BP 128/72 | HR 50 | Temp 97.5°F | Ht 59.0 in | Wt 149.2 lb

## 2022-02-03 DIAGNOSIS — M62838 Other muscle spasm: Secondary | ICD-10-CM

## 2022-02-03 DIAGNOSIS — C8310 Mantle cell lymphoma, unspecified site: Secondary | ICD-10-CM

## 2022-02-03 DIAGNOSIS — D5919 Other autoimmune hemolytic anemia: Secondary | ICD-10-CM

## 2022-02-03 DIAGNOSIS — K219 Gastro-esophageal reflux disease without esophagitis: Secondary | ICD-10-CM

## 2022-02-03 DIAGNOSIS — Z136 Encounter for screening for cardiovascular disorders: Secondary | ICD-10-CM

## 2022-02-03 DIAGNOSIS — Z23 Encounter for immunization: Secondary | ICD-10-CM

## 2022-02-03 DIAGNOSIS — E559 Vitamin D deficiency, unspecified: Secondary | ICD-10-CM

## 2022-02-03 DIAGNOSIS — I10 Essential (primary) hypertension: Secondary | ICD-10-CM | POA: Diagnosis not present

## 2022-02-03 DIAGNOSIS — M8580 Other specified disorders of bone density and structure, unspecified site: Secondary | ICD-10-CM

## 2022-02-03 DIAGNOSIS — Z0000 Encounter for general adult medical examination without abnormal findings: Secondary | ICD-10-CM | POA: Diagnosis not present

## 2022-02-03 DIAGNOSIS — E669 Obesity, unspecified: Secondary | ICD-10-CM

## 2022-02-03 DIAGNOSIS — R7309 Other abnormal glucose: Secondary | ICD-10-CM

## 2022-02-03 DIAGNOSIS — E782 Mixed hyperlipidemia: Secondary | ICD-10-CM

## 2022-02-03 DIAGNOSIS — I700 Atherosclerosis of aorta: Secondary | ICD-10-CM

## 2022-02-03 DIAGNOSIS — Z1389 Encounter for screening for other disorder: Secondary | ICD-10-CM

## 2022-02-03 DIAGNOSIS — R1310 Dysphagia, unspecified: Secondary | ICD-10-CM

## 2022-02-03 DIAGNOSIS — N1831 Chronic kidney disease, stage 3a: Secondary | ICD-10-CM

## 2022-02-03 DIAGNOSIS — M6208 Separation of muscle (nontraumatic), other site: Secondary | ICD-10-CM

## 2022-02-03 DIAGNOSIS — Z79899 Other long term (current) drug therapy: Secondary | ICD-10-CM | POA: Diagnosis not present

## 2022-02-03 DIAGNOSIS — B351 Tinea unguium: Secondary | ICD-10-CM

## 2022-02-03 DIAGNOSIS — Z0001 Encounter for general adult medical examination with abnormal findings: Secondary | ICD-10-CM

## 2022-02-03 DIAGNOSIS — Z1329 Encounter for screening for other suspected endocrine disorder: Secondary | ICD-10-CM

## 2022-02-03 MED ORDER — CYCLOBENZAPRINE HCL 5 MG PO TABS: 5.0000 mg | ORAL_TABLET | Freq: Three times a day (TID) | ORAL | 0 refills | Status: DC | PRN

## 2022-02-03 NOTE — Patient Instructions (Signed)
Flexeril(cyclobenzaprine) every 8 hours as needed- can make you sleepy so only take when going to be home.   If pain in neck does not improve please notify the office  Please ask Dr Osker Mason if you can take terbinafine with the Imbruvica for toenail fungus  Muscle Cramps and Spasms Muscle cramps and spasms are when muscles tighten by themselves. They usually get better within minutes. Muscle cramps are painful. They are usually stronger and last longer than muscle spasms. Muscle spasms may or may not be painful. They can last a few seconds or much longer. Cramps and spasms can affect any muscle, but they occur most often in the calf muscles of the leg. They are usually not caused by a serious problem. In many cases, the cause is not known. Some common causes include: Doing more physical work or exercise than your body is ready for. Using the muscles too much (overuse) by repeating certain movements too many times. Staying in a certain position for a long time. Playing a sport or doing an activity without preparing properly. Using bad form or technique while playing a sport or doing an activity. Not having enough water in your body (dehydration). Injury. Side effects of some medicines. Low levels of the salts and minerals in your blood (electrolytes), such as low potassium or calcium. Follow these instructions at home: Managing pain and stiffness     Massage, stretch, and relax the muscle. Do this for many minutes at a time. If told, put heat on tight or tense muscles as often as told by your doctor. Use the heat source that your doctor recommends, such as a moist heat pack or a heating pad. Place a towel between your skin and the heat source. Leave the heat on for 20-30 minutes. Remove the heat if your skin turns bright red. This is very important if you are not able to feel pain, heat, or cold. You may have a greater risk of getting burned. If told, put ice on the affected area. This may  help if you are sore or have pain after a cramp or spasm. Put ice in a plastic bag. Place a towel between your skin and the bag. Leave the ice on for 20 minutes, 2-3 times a day. Try taking hot showers or baths to help relax tight muscles. Eating and drinking Drink enough fluid to keep your pee (urine) pale yellow. Eat a healthy diet to help ensure that your muscles work well. This should include: Fruits and vegetables. Lean protein. Whole grains. Low-fat or nonfat dairy products. General instructions If you are having cramps often, avoid intense exercise for several days. Take over-the-counter and prescription medicines only as told by your doctor. Watch for any changes in your symptoms. Keep all follow-up visits as told by your doctor. This is important. Contact a doctor if: Your cramps or spasms get worse or happen more often. Your cramps or spasms do not get better with time. Summary Muscle cramps and spasms are when muscles tighten by themselves. They usually get better within minutes. Cramps and spasms occur most often in the calf muscles of the leg. Massage, stretch, and relax the muscle. This may help the cramp or spasm go away. Drink enough fluid to keep your pee (urine) pale yellow. This information is not intended to replace advice given to you by your health care provider. Make sure you discuss any questions you have with your health care provider. Document Revised: 10/09/2020 Document Reviewed: 10/09/2020 Elsevier Patient Education  2023 Elsevier Inc.  

## 2022-02-04 LAB — COMPLETE METABOLIC PANEL WITH GFR
AG Ratio: 2 (calc) (ref 1.0–2.5)
ALT: 22 U/L (ref 6–29)
AST: 25 U/L (ref 10–35)
Albumin: 4.5 g/dL (ref 3.6–5.1)
Alkaline phosphatase (APISO): 61 U/L (ref 37–153)
BUN/Creatinine Ratio: 17 (calc) (ref 6–22)
BUN: 24 mg/dL (ref 7–25)
CO2: 25 mmol/L (ref 20–32)
Calcium: 10.5 mg/dL — ABNORMAL HIGH (ref 8.6–10.4)
Chloride: 102 mmol/L (ref 98–110)
Creat: 1.43 mg/dL — ABNORMAL HIGH (ref 0.60–0.95)
Globulin: 2.3 g/dL (calc) (ref 1.9–3.7)
Glucose, Bld: 81 mg/dL (ref 65–99)
Potassium: 4.7 mmol/L (ref 3.5–5.3)
Sodium: 137 mmol/L (ref 135–146)
Total Bilirubin: 1.2 mg/dL (ref 0.2–1.2)
Total Protein: 6.8 g/dL (ref 6.1–8.1)
eGFR: 37 mL/min/{1.73_m2} — ABNORMAL LOW (ref >=60)

## 2022-02-04 LAB — HEMOGLOBIN A1C
Hgb A1c MFr Bld: 5.5 % of total Hgb (ref <5.7)
Mean Plasma Glucose: 111 mg/dL
eAG (mmol/L): 6.2 mmol/L

## 2022-02-04 LAB — CBC WITH DIFFERENTIAL/PLATELET
Absolute Monocytes: 734 cells/uL (ref 200–950)
Basophils Absolute: 38 cells/uL (ref 0–200)
Basophils Relative: 0.7 %
Eosinophils Absolute: 211 cells/uL (ref 15–500)
Eosinophils Relative: 3.9 %
HCT: 46.3 % — ABNORMAL HIGH (ref 35.0–45.0)
Hemoglobin: 16.1 g/dL — ABNORMAL HIGH (ref 11.7–15.5)
Lymphs Abs: 2074 cells/uL (ref 850–3900)
MCH: 31.3 pg (ref 27.0–33.0)
MCHC: 34.8 g/dL (ref 32.0–36.0)
MCV: 90.1 fL (ref 80.0–100.0)
MPV: 13.8 fL — ABNORMAL HIGH (ref 7.5–12.5)
Monocytes Relative: 13.6 %
Neutro Abs: 2344 cells/uL (ref 1500–7800)
Neutrophils Relative %: 43.4 %
Platelets: 134 10*3/uL — ABNORMAL LOW (ref 140–400)
RBC: 5.14 10*6/uL — ABNORMAL HIGH (ref 3.80–5.10)
RDW: 12.4 % (ref 11.0–15.0)
Total Lymphocyte: 38.4 %
WBC: 5.4 10*3/uL (ref 3.8–10.8)

## 2022-02-04 LAB — URINALYSIS, ROUTINE W REFLEX MICROSCOPIC
Bacteria, UA: NONE SEEN /HPF
Bilirubin Urine: NEGATIVE
Glucose, UA: NEGATIVE
Hgb urine dipstick: NEGATIVE
Ketones, ur: NEGATIVE
Nitrite: NEGATIVE
Protein, ur: NEGATIVE
RBC / HPF: NONE SEEN /HPF (ref 0–2)
Specific Gravity, Urine: 1.007 (ref 1.001–1.035)
WBC, UA: >=60 /HPF — AB (ref 0–5)
pH: 5.5 (ref 5.0–8.0)

## 2022-02-04 LAB — LIPID PANEL
Cholesterol: 213 mg/dL — ABNORMAL HIGH (ref <200)
HDL: 50 mg/dL (ref >=50)
LDL Cholesterol (Calc): 135 mg/dL (calc) — ABNORMAL HIGH
Non-HDL Cholesterol (Calc): 163 mg/dL (calc) — ABNORMAL HIGH (ref <130)
Total CHOL/HDL Ratio: 4.3 (calc) (ref <5.0)
Triglycerides: 149 mg/dL (ref <150)

## 2022-02-04 LAB — MAGNESIUM: Magnesium: 2.2 mg/dL (ref 1.5–2.5)

## 2022-02-04 LAB — MICROALBUMIN / CREATININE URINE RATIO
Creatinine, Urine: 53 mg/dL (ref 20–275)
Microalb Creat Ratio: 17 mcg/mg creat (ref <30)
Microalb, Ur: 0.9 mg/dL

## 2022-02-04 LAB — VITAMIN D 25 HYDROXY (VIT D DEFICIENCY, FRACTURES): Vit D, 25-Hydroxy: 69 ng/mL (ref 30–100)

## 2022-02-04 LAB — TSH: TSH: 3.05 mIU/L (ref 0.40–4.50)

## 2022-02-04 LAB — MICROSCOPIC MESSAGE

## 2022-02-04 NOTE — Progress Notes (Signed)
LMOM

## 2022-02-10 ENCOUNTER — Inpatient Hospital Stay: Payer: Medicare Other | Attending: Oncology

## 2022-02-10 ENCOUNTER — Other Ambulatory Visit: Payer: Self-pay

## 2022-02-10 ENCOUNTER — Inpatient Hospital Stay: Payer: Medicare Other | Admitting: Oncology

## 2022-02-10 VITALS — BP 149/63 | HR 56 | Temp 97.6°F | Resp 15 | Wt 151.8 lb

## 2022-02-10 DIAGNOSIS — Z79899 Other long term (current) drug therapy: Secondary | ICD-10-CM | POA: Diagnosis not present

## 2022-02-10 DIAGNOSIS — D591 Autoimmune hemolytic anemia, unspecified: Secondary | ICD-10-CM | POA: Diagnosis not present

## 2022-02-10 DIAGNOSIS — C8310 Mantle cell lymphoma, unspecified site: Secondary | ICD-10-CM

## 2022-02-10 DIAGNOSIS — K1230 Oral mucositis (ulcerative), unspecified: Secondary | ICD-10-CM | POA: Insufficient documentation

## 2022-02-10 DIAGNOSIS — D649 Anemia, unspecified: Secondary | ICD-10-CM

## 2022-02-10 LAB — CBC WITH DIFFERENTIAL (CANCER CENTER ONLY)
Abs Immature Granulocytes: 0.05 10*3/uL (ref 0.00–0.07)
Basophils Absolute: 0.1 10*3/uL (ref 0.0–0.1)
Basophils Relative: 1 %
Eosinophils Absolute: 0.3 10*3/uL (ref 0.0–0.5)
Eosinophils Relative: 4 %
HCT: 46.6 % — ABNORMAL HIGH (ref 36.0–46.0)
Hemoglobin: 15.6 g/dL — ABNORMAL HIGH (ref 12.0–15.0)
Immature Granulocytes: 1 %
Lymphocytes Relative: 38 %
Lymphs Abs: 2.7 10*3/uL (ref 0.7–4.0)
MCH: 31 pg (ref 26.0–34.0)
MCHC: 33.5 g/dL (ref 30.0–36.0)
MCV: 92.5 fL (ref 80.0–100.0)
Monocytes Absolute: 0.9 10*3/uL (ref 0.1–1.0)
Monocytes Relative: 14 %
Neutro Abs: 3 10*3/uL (ref 1.7–7.7)
Neutrophils Relative %: 42 %
Platelet Count: 126 10*3/uL — ABNORMAL LOW (ref 150–400)
RBC: 5.04 MIL/uL (ref 3.87–5.11)
RDW: 12.6 % (ref 11.5–15.5)
WBC Count: 6.9 10*3/uL (ref 4.0–10.5)
nRBC: 0 % (ref 0.0–0.2)

## 2022-02-10 LAB — CMP (CANCER CENTER ONLY)
ALT: 14 U/L (ref 0–44)
AST: 22 U/L (ref 15–41)
Albumin: 4.3 g/dL (ref 3.5–5.0)
Alkaline Phosphatase: 54 U/L (ref 38–126)
Anion gap: 7 (ref 5–15)
BUN: 33 mg/dL — ABNORMAL HIGH (ref 8–23)
CO2: 29 mmol/L (ref 22–32)
Calcium: 10.2 mg/dL (ref 8.9–10.3)
Chloride: 101 mmol/L (ref 98–111)
Creatinine: 1.39 mg/dL — ABNORMAL HIGH (ref 0.44–1.00)
GFR, Estimated: 38 mL/min — ABNORMAL LOW (ref >60)
Glucose, Bld: 77 mg/dL (ref 70–99)
Potassium: 4.7 mmol/L (ref 3.5–5.1)
Sodium: 137 mmol/L (ref 135–145)
Total Bilirubin: 1.1 mg/dL (ref 0.3–1.2)
Total Protein: 6.7 g/dL (ref 6.5–8.1)

## 2022-02-10 NOTE — Progress Notes (Signed)
Hematology and Oncology Follow Up   Jacqueline Hancock [id number] 1940-09-11 81 y.o. 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   Prior Therapy:  She is status post bone marrow biopsy completed on March 05, 2020.   Prednisone 60 mg daily in the January 2022 to treat her hemolytic anemia.  This was tapered off after complete hematological response.   Current therapy: Imbruvica 560 mg started on April 28, 2020.  Her dose was reduced to 280 mg in July 2023.  Interim History: Ms. Hewes returns today for a follow-up.  Since the last visit, she reports no major changes in her health.  She has tolerated Imbruvica with the reduced dose without any complaints.  She denies any nausea vomiting or abdominal pain.  Her mouth ulcers and sores has improved at this time.  She denies any palpitation or fatigue.  She denies any bleeding or any hospitalizations.  She continues to be active and attends to activities of daily living.     Medications: Updated on review. Current Outpatient Medications  Medication Sig Dispense Refill   calcium carbonate (OSCAL) 1500 (600 Ca) MG TABS tablet Take 500 mg by mouth daily.     Cholecalciferol (VITAMIN D) 2000 UNITS tablet Take 4,000 Units by mouth daily.      cyclobenzaprine (FLEXERIL) 5 MG tablet Take 1 tablet (5 mg total) by mouth 3 (three) times daily as needed for muscle spasms. 60 tablet 0   Ferrous Sulfate (IRON) 325 (65 Fe) MG TABS Take 325 mg by mouth daily.      fluticasone (FLONASE) 50 MCG/ACT nasal spray Place 2 sprays into both nostrils daily. 16 g 2   ibrutinib (IMBRUVICA) 280 MG tablet Take one tablet daily with a glass of water. 56 tablet 3   losartan (COZAAR) 50 MG tablet TAKE 1 TABLET(50 MG) BY  MOUTH DAILY 90 tablet 1   nadolol (CORGARD) 20 MG tablet Take  1 tablet  Daily  for BP & Tremor 90 tablet 3   nystatin (MYCOSTATIN) 100000 UNIT/ML suspension 5 ml four times a day, retain in mouth as long as possible (Swish and Spit).  Use for 48 hours after symptoms resolve. 80 mL 0   olopatadine (PATANOL) 0.1 % ophthalmic solution Place 1 drop into both eyes 2 (two) times daily. 5 mL 1   No current facility-administered medications for this visit.     Allergies:  Allergies  Allergen Reactions   Nexium [Esomeprazole Magnesium] Other (See Comments)    Headache   Physical exam      Blood pressure (!) 149/63, pulse (!) 56, temperature 97.6 F (36.4 C), temperature source Temporal, resp. rate 15, weight 151 lb 12.8 oz (68.9 kg), SpO2 98 %.     ECOG 1   General appearance: Alert, awake without any distress. Head: Atraumatic without abnormalities Oropharynx: Without any thrush or ulcers. Eyes: No scleral icterus. Lymph nodes: No lymphadenopathy noted in the cervical, supraclavicular, or axillary nodes Heart:regular rate and rhythm, without any murmurs or gallops.   Lung: Clear to auscultation without any rhonchi, wheezes or dullness to percussion. Abdomin: Soft, nontender without any shifting dullness or ascites. Musculoskeletal: No clubbing or cyanosis. Neurological: No motor or sensory deficits. Skin: No rashes or lesions.  Lab Results: Lab Results  Component Value Date   WBC 5.4 02/03/2022   HGB 16.1 (H) 02/03/2022   HCT 46.3 (H) 02/03/2022   MCV 90.1 02/03/2022   PLT 134 (L) 02/03/2022     Chemistry      Component Value Date/Time   NA 137 02/03/2022 1558   K 4.7 02/03/2022 1558   CL 102 02/03/2022 1558   CO2 25 02/03/2022 1558   BUN 24 02/03/2022 1558   CREATININE 1.43 (H) 02/03/2022 1558      Component Value Date/Time   CALCIUM 10.5 (H) 02/03/2022 1558   ALKPHOS 43 10/07/2021 1008   AST 25 02/03/2022 1558   AST 26 10/07/2021 1008   ALT  22 02/03/2022 1558   ALT 14 10/07/2021 1008   BILITOT 1.2 02/03/2022 1558   BILITOT 1.5 (H) 10/07/2021 1008          Impression and Plan:   81 year old woman with:  1.  Mantle cell lymphoma presented with hemolytic anemia and splenomegaly in December 2021.     She continues to [place] without any major complications at this time.  CBC on 02/03/2022 showed normal white cell count and differential with improvement in her platelets.  Her hemoglobin was 16.1.  Risks and benefits of continuing this treatment versus switching to [place] could be considered at this time.  Given her excellent response and tolerance I recommended continuing the same dose and schedule.  2.  Autoimmune hemolytic anemia: Area globin continues to be in normal range without any evidence of hemolysis.  3.  Mucositis: Improved after dose reduction of Imbruvica.  4.  Follow-up: In 3 months for a follow-up visit.   30  minutes were dedicated to this visit.  The time was spent on updating disease status, treatment choices and outlining future plan of care discussion.   Zola Button, MD 02/10/2022 2:19 PM

## 2022-03-17 DIAGNOSIS — Z1239 Encounter for other screening for malignant neoplasm of breast: Secondary | ICD-10-CM | POA: Diagnosis not present

## 2022-03-17 DIAGNOSIS — Z780 Asymptomatic menopausal state: Secondary | ICD-10-CM | POA: Diagnosis not present

## 2022-03-17 DIAGNOSIS — Z1231 Encounter for screening mammogram for malignant neoplasm of breast: Secondary | ICD-10-CM | POA: Diagnosis not present

## 2022-03-18 ENCOUNTER — Telehealth: Payer: Self-pay | Admitting: Pharmacy Technician

## 2022-03-18 ENCOUNTER — Other Ambulatory Visit (HOSPITAL_COMMUNITY): Payer: Self-pay

## 2022-03-18 NOTE — Telephone Encounter (Signed)
Oral Oncology Patient Advocate Encounter   Received notification that patient is due for re-enrollment for assistance for Imbruvica through [employer].   Re-enrollment process has been initiated and will be submitted upon completion of necessary documents.  [employer] phone number [phone].   I will continue to follow until final determination.  Yuriko Portales, CPhT-Adv Oncology Pharmacy Patient Advocate [facility] Direct Number: [phone]  Fax: [phone]   

## 2022-03-18 NOTE — Telephone Encounter (Signed)
Oral Oncology Patient Advocate Encounter  After completing a benefits investigation, prior authorization for Imbruvica is not required at this time through [place] .  Patient's copay is $848.22.     Jacqueline Hancock, CPhT-Adv Oncology Pharmacy Patient [place] Direct Number: [phone]  Fax: [phone]

## 2022-03-21 NOTE — Telephone Encounter (Signed)
Oral Oncology Patient Advocate Encounter   Met with patient in lobby to collect paperwork requested by [facility].  Application is now pending MD signatures   I will continue to check the status until final determination.   Lady Deutscher, CPhT-Adv Oncology Pharmacy Patient [place] Direct Number: [phone]  Fax: [phone]

## 2022-03-22 NOTE — Telephone Encounter (Signed)
Oral Oncology Patient Advocate Encounter   Submitted application for assistance for Imbruvica to [facility]   Application submitted via e-fax to [phone] on 03/21/2022   [facility] phone number [phone].   I will continue to check the status until final determination.   Lady Deutscher, CPhT-Adv Oncology Pharmacy Patient [place] Direct Number: [phone]  Fax: [phone]

## 2022-03-24 DIAGNOSIS — L600 Ingrowing nail: Secondary | ICD-10-CM | POA: Diagnosis not present

## 2022-03-24 DIAGNOSIS — M2041 Other hammer toe(s) (acquired), right foot: Secondary | ICD-10-CM | POA: Diagnosis not present

## 2022-03-31 DIAGNOSIS — T8189XA Other complications of procedures, not elsewhere classified, initial encounter: Secondary | ICD-10-CM | POA: Diagnosis not present

## 2022-04-13 ENCOUNTER — Telehealth: Payer: Self-pay | Admitting: Pharmacy Technician

## 2022-04-13 ENCOUNTER — Other Ambulatory Visit (HOSPITAL_COMMUNITY): Payer: Self-pay

## 2022-04-13 ENCOUNTER — Other Ambulatory Visit: Payer: Self-pay | Admitting: *Deleted

## 2022-04-13 DIAGNOSIS — C8310 Mantle cell lymphoma, unspecified site: Secondary | ICD-10-CM

## 2022-04-13 MED ORDER — IBRUTINIB 280 MG PO TABS
ORAL_TABLET | ORAL | 6 refills | Status: DC
  Filled 2022-04-13: qty 56, fill #0
  Filled 2022-05-16: qty 28, 28d supply, fill #0
  Filled 2022-06-09: qty 28, 28d supply, fill #1
  Filled 2022-07-12: qty 28, 28d supply, fill #2
  Filled 2022-08-08: qty 28, 28d supply, fill #3
  Filled 2022-09-06: qty 28, 28d supply, fill #4

## 2022-04-13 NOTE — Telephone Encounter (Signed)
Oral Oncology Patient Advocate Encounter   Was successful in securing patient an $3,250 grant from [facility] [place] [facility]) to provide copayment coverage for Imbruvica.  This will keep the out of pocket expense at $0.     I have spoken with the patient.    The billing information is as follows and has been shared with [place].   Member ID: [id number] Group ID: [id number] RxBin: [id number] Dates of Eligibility: 01/13/22 through 04/13/23  Fund:  [place] Patient Jacqueline Hancock Direct Number: [phone]  Fax: [phone]

## 2022-04-13 NOTE — Telephone Encounter (Signed)
Oral Oncology Patient Advocate Encounter  Was successful in securing patient a $8,500 grant from [employer] to provide copayment coverage for Imbruvica.  This will keep the out of pocket expense at $0.     [employer] ID: [id number]  I have spoken with the patient.   The billing information is as follows and has been shared with [facility].    RxBin: [id number] PCN: [id number] Member ID: [id number] Group ID: [id number] Dates of Eligibility: 03/14/22 through 03/14/23  Fund:  [place], CPhT-Adv Oncology Pharmacy Patient [place] Direct Number: [phone]  Fax: [phone]

## 2022-04-13 NOTE — Telephone Encounter (Signed)
Oral Oncology Patient Advocate Encounter  Patient must use available grant funding before being re-enrolled into patient assistance. I will re-open this case at a later time if needed.  I have spoken with the patient.  Lady Deutscher, CPhT-Adv Oncology Pharmacy Patient [place] Direct Number: [phone]  Fax: [phone]

## 2022-04-14 ENCOUNTER — Telehealth: Payer: Self-pay | Admitting: Oncology

## 2022-04-14 DIAGNOSIS — L600 Ingrowing nail: Secondary | ICD-10-CM | POA: Diagnosis not present

## 2022-04-14 NOTE — Telephone Encounter (Signed)
Called patient regarding providers departure and rescheduled February appointment with a new provider.

## 2022-04-19 ENCOUNTER — Other Ambulatory Visit (HOSPITAL_COMMUNITY): Payer: Self-pay

## 2022-04-22 ENCOUNTER — Telehealth: Payer: Self-pay | Admitting: Hematology

## 2022-04-22 NOTE — Telephone Encounter (Signed)
Called patient regarding upcoming February appointments, patient is notified.

## 2022-04-25 ENCOUNTER — Other Ambulatory Visit: Payer: Self-pay | Admitting: Nurse Practitioner

## 2022-04-25 ENCOUNTER — Telehealth: Payer: Self-pay | Admitting: Nurse Practitioner

## 2022-04-25 DIAGNOSIS — I159 Secondary hypertension, unspecified: Secondary | ICD-10-CM

## 2022-04-25 MED ORDER — LOSARTAN POTASSIUM 50 MG PO TABS: ORAL_TABLET | ORAL | 1 refills | Status: DC

## 2022-04-25 NOTE — Telephone Encounter (Signed)
Patient is requesting a refill on Losartan to [facility] on [address].

## 2022-04-28 ENCOUNTER — Ambulatory Visit: Payer: Self-pay

## 2022-04-28 NOTE — Patient Outreach (Signed)
  Care Coordination   04/28/2022 Name: Jacqueline Hancock MRN: [id number] DOB: [birth year]   Care Coordination Outreach Attempts:  An unsuccessful telephone outreach was attempted today to offer the patient information about available care coordination services as a benefit of their health plan.   Follow Up Plan:  Additional outreach attempts will be made to offer the patient care coordination information and services.   Encounter Outcome:  No Answer   Care Coordination Interventions:  No, not indicated    Jone Baseman, RN, MSN [place] [facility] Care Management Coordinator Direct Line [phone]

## 2022-05-04 ENCOUNTER — Ambulatory Visit: Payer: Self-pay

## 2022-05-04 NOTE — Patient Outreach (Signed)
  Care Coordination   05/04/2022 Name: Jacqueline Hancock MRN: [id number] DOB: 07/16/1940   Care Coordination Outreach Attempts:  An unsuccessful telephone outreach was attempted today to offer the patient information about available care coordination services as a benefit of their health plan.   Follow Up Plan:  Additional outreach attempts will be made to offer the patient care coordination information and services.   Encounter Outcome:  No Answer   Care Coordination Interventions:  No, not indicated    Jone Baseman, RN, MSN [place] [facility] Care Management Coordinator Direct Line [phone]

## 2022-05-11 ENCOUNTER — Telehealth: Payer: Self-pay | Admitting: Hematology

## 2022-05-11 ENCOUNTER — Other Ambulatory Visit: Payer: Self-pay

## 2022-05-11 ENCOUNTER — Inpatient Hospital Stay: Payer: Medicare Other | Admitting: Hematology

## 2022-05-11 ENCOUNTER — Inpatient Hospital Stay: Payer: Medicare Other | Attending: Oncology

## 2022-05-11 VITALS — BP 155/70 | HR 50 | Temp 97.5°F | Resp 18 | Wt 153.4 lb

## 2022-05-11 DIAGNOSIS — I10 Essential (primary) hypertension: Secondary | ICD-10-CM | POA: Insufficient documentation

## 2022-05-11 DIAGNOSIS — C8310 Mantle cell lymphoma, unspecified site: Secondary | ICD-10-CM

## 2022-05-11 DIAGNOSIS — K219 Gastro-esophageal reflux disease without esophagitis: Secondary | ICD-10-CM | POA: Diagnosis not present

## 2022-05-11 DIAGNOSIS — M8580 Other specified disorders of bone density and structure, unspecified site: Secondary | ICD-10-CM | POA: Diagnosis not present

## 2022-05-11 DIAGNOSIS — K1230 Oral mucositis (ulcerative), unspecified: Secondary | ICD-10-CM | POA: Insufficient documentation

## 2022-05-11 DIAGNOSIS — D649 Anemia, unspecified: Secondary | ICD-10-CM

## 2022-05-11 DIAGNOSIS — E785 Hyperlipidemia, unspecified: Secondary | ICD-10-CM | POA: Diagnosis not present

## 2022-05-11 LAB — CBC WITH DIFFERENTIAL (CANCER CENTER ONLY)
Abs Immature Granulocytes: 0.03 10*3/uL (ref 0.00–0.07)
Basophils Absolute: 0.1 10*3/uL (ref 0.0–0.1)
Basophils Relative: 1 %
Eosinophils Absolute: 0.2 10*3/uL (ref 0.0–0.5)
Eosinophils Relative: 3 %
HCT: 50.7 % — ABNORMAL HIGH (ref 36.0–46.0)
Hemoglobin: 17.1 g/dL — ABNORMAL HIGH (ref 12.0–15.0)
Immature Granulocytes: 0 %
Lymphocytes Relative: 38 %
Lymphs Abs: 2.8 10*3/uL (ref 0.7–4.0)
MCH: 30.9 pg (ref 26.0–34.0)
MCHC: 33.7 g/dL (ref 30.0–36.0)
MCV: 91.5 fL (ref 80.0–100.0)
Monocytes Absolute: 0.9 10*3/uL (ref 0.1–1.0)
Monocytes Relative: 13 %
Neutro Abs: 3.3 10*3/uL (ref 1.7–7.7)
Neutrophils Relative %: 45 %
Platelet Count: 118 10*3/uL — ABNORMAL LOW (ref 150–400)
RBC: 5.54 MIL/uL — ABNORMAL HIGH (ref 3.87–5.11)
RDW: 13.1 % (ref 11.5–15.5)
WBC Count: 7.4 10*3/uL (ref 4.0–10.5)
nRBC: 0 % (ref 0.0–0.2)

## 2022-05-11 LAB — CMP (CANCER CENTER ONLY)
ALT: 12 U/L (ref 0–44)
AST: 23 U/L (ref 15–41)
Albumin: 4.5 g/dL (ref 3.5–5.0)
Alkaline Phosphatase: 56 U/L (ref 38–126)
Anion gap: 9 (ref 5–15)
BUN: 27 mg/dL — ABNORMAL HIGH (ref 8–23)
CO2: 22 mmol/L (ref 22–32)
Calcium: 10.3 mg/dL (ref 8.9–10.3)
Chloride: 104 mmol/L (ref 98–111)
Creatinine: 1.25 mg/dL — ABNORMAL HIGH (ref 0.44–1.00)
GFR, Estimated: 43 mL/min — ABNORMAL LOW (ref >60)
Glucose, Bld: 78 mg/dL (ref 70–99)
Potassium: 4.8 mmol/L (ref 3.5–5.1)
Sodium: 135 mmol/L (ref 135–145)
Total Bilirubin: 1.5 mg/dL — ABNORMAL HIGH (ref 0.3–1.2)
Total Protein: 6.9 g/dL (ref 6.5–8.1)

## 2022-05-11 MED ORDER — CLOTRIMAZOLE 1 % EX CREA: 1.0000 | TOPICAL_CREAM | Freq: Two times a day (BID) | CUTANEOUS | 1 refills | Status: DC

## 2022-05-11 NOTE — Telephone Encounter (Signed)
Called patient per 2/7 los notes to schedule f/u. Patient scheduled and notified.

## 2022-05-11 NOTE — Progress Notes (Signed)
HEMATOLOGY/ONCOLOGY CONSULTATION NOTE  Date of Service: 05/11/2022  Patient Care Team: Unk Pinto, MD as PCP - General (Internal Medicine) Jodi Marble, MD as Consulting Physician (Otolaryngology) Richmond Campbell, MD as Consulting Physician (Gastroenterology) Leo Grosser Seymour Bars, MD (Inactive) as Consulting Physician (Obstetrics and Gynecology) Rutherford Guys, MD as Consulting Physician (Ophthalmology) Newton Pigg, [facility] (Inactive) as Pharmacist (Pharmacist) Jon Billings, RN as [place] [facility]  CHIEF COMPLAINTS/PURPOSE OF CONSULTATION:  NHL  Prior Therapy:  She is status post bone marrow biopsy completed on March 05, 2020.    Prednisone 60 mg daily in the January 2022 to treat her hemolytic anemia.  This was tapered off after complete hematological response.  HISTORY OF PRESENTING ILLNESS:   Jacqueline Hancock is a wonderful 82 y.o. female who is here for continued evaluation and management of NHL, unspecified body region. Patient was following up with Dr. Alen Blew.   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 BM Bx Cyclin D1 IHC +ve byt FISH neg . She has presented with splenomegaly, anemia, and lymphocytosis. 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   Patient's current treatment is Imbruvica 280 mg. She was started on Imbruvica 560 mg on January 2022, which was then reduced to 280 mg in July 2023.   Patient was last seen by Dr. Alen Blew on 02/10/2022 and she was doing well overall.   Patient reports she has been doing well overall since our last visit. She does complains of itchy skin rashes, mostly around the face and head, for around 2-3 weeks ago. Patient notes that she was started couple of new medications from other physicians, but she is unsure of the names. Patient notes that she was started on oxycodone around 3-4 weeks ago. She discontinued oxycodone.    She notes that there is redness around her face, which is not new.   Patient is currently taking Imbruvica 280 mg as prescribed and has been tolerating it well without any severe toxicities.   She is currently taking Iron supplement, calcium supplement, and Vitamin-D supplement.   She denies fever, chills, night sweats, unexpected weight loss, back pain, abdominal pain, chest pain, or leg swelling. Patient does complain of occasional abdominal pain.   Patient notes she had a surgery near her right neck/ear area to remove an enlarged lymph node couple years ago. She complains of mild occasional pain near the site.  MEDICAL HISTORY:  Past Medical History:  Diagnosis Date   Dyspnea 2021   with exersion    Early cataracts, bilateral    Elevated hemoglobin A1c    Fibroids    GERD (gastroesophageal reflux disease)    Hyperlipidemia    Hypertension    Osteopenia     SURGICAL HISTORY: Past Surgical History:  Procedure Laterality Date   ABDOMINAL HYSTERECTOMY     APPENDECTOMY     CATARACT EXTRACTION, BILATERAL Bilateral 09/2017   Dr. 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   TONSILLECTOMY AND ADENOIDECTOMY      SOCIAL HISTORY: Social History   Socioeconomic History   Marital status: Married    Spouse name: Not on file   Number of children: Not on file   Years of education: Not on file   Highest education level: Not on file  Occupational History   Not on file  Tobacco Use   Smoking status: Never   Smokeless tobacco: Never  Vaping Use  Vaping Use: Never used  Substance and Sexual Activity   Alcohol use: No   Drug use: Never   Sexual activity: Not on file  Other Topics Concern   Not on file  Social History Narrative   Not on file   Social Determinants of Health   Financial Resource Strain: Not on file  Food Insecurity: No Food Insecurity (07/20/2021)   Hunger Vital Sign    Worried About Running Out of Food in the Last Year: Never true    Ran  Out of Food in the Last Year: Never true  Transportation Needs: No Transportation Needs (07/20/2021)   PRAPARE - Hydrologist (Medical): No    Lack of Transportation (Non-Medical): No  Physical Activity: Not on file  Stress: Not on file  Social Connections: Not on file  Intimate Partner Violence: Not on file    FAMILY HISTORY: Family History  Problem Relation Age of Onset   Leukemia Mother    Hypertension Father    Diabetes Sister    Heart disease Brother    Diabetes Brother    Diabetes Brother     ALLERGIES:  is allergic to nexium [esomeprazole magnesium].  MEDICATIONS:  Current Outpatient Medications  Medication Sig Dispense Refill   calcium carbonate (OSCAL) 1500 (600 Ca) MG TABS tablet Take 500 mg by mouth daily.     Cholecalciferol (VITAMIN D) 2000 UNITS tablet Take 4,000 Units by mouth daily.      cyclobenzaprine (FLEXERIL) 5 MG tablet Take 1 tablet (5 mg total) by mouth 3 (three) times daily as needed for muscle spasms. 60 tablet 0   Ferrous Sulfate (IRON) 325 (65 Fe) MG TABS Take 325 mg by mouth daily.      fluticasone (FLONASE) 50 MCG/ACT nasal spray Place 2 sprays into both nostrils daily. 16 g 2   ibrutinib (IMBRUVICA) 280 MG tablet Take one tablet daily with a glass of water. 56 tablet 6   losartan (COZAAR) 50 MG tablet TAKE 1 TABLET(50 MG) BY MOUTH DAILY 90 tablet 1   nadolol (CORGARD) 20 MG tablet Take  1 tablet  Daily  for BP & Tremor 90 tablet 3   nystatin (MYCOSTATIN) 100000 UNIT/ML suspension 5 ml four times a day, retain in mouth as long as possible (Swish and Spit).  Use for 48 hours after symptoms resolve. 80 mL 0   olopatadine (PATANOL) 0.1 % ophthalmic solution Place 1 drop into both eyes 2 (two) times daily. 5 mL 1   No current facility-administered medications for this visit.    REVIEW OF SYSTEMS:    10 Point review of Systems was done is negative except as noted above.  PHYSICAL EXAMINATION: ECOG PERFORMANCE STATUS: 2  - Symptomatic, <50% confined to bed  . Vitals:   05/11/22 1217  BP: (!) 155/70  Pulse: (!) 50  Resp: 18  Temp: (!) 97.5 F (36.4 C)  SpO2: 100%   Filed Weights   05/11/22 1217  Weight: 153 lb 6.4 oz (69.6 kg)   .Body mass index is 30.98 kg/m.  GENERAL:alert, in no acute distress and comfortable SKIN: no acute rashes, no significant lesions EYES: conjunctiva are pink and non-injected, sclera anicteric OROPHARYNX: MMM, no exudates, no oropharyngeal erythema or ulceration NECK: supple, no JVD LYMPH:  no palpable lymphadenopathy in the cervical, axillary or inguinal regions LUNGS: clear to auscultation b/l with normal respiratory effort HEART: regular rate & rhythm ABDOMEN:  normoactive bowel sounds , non tender, not distended. Extremity: no  pedal edema PSYCH: alert & oriented x 3 with fluent speech NEURO: no focal motor/sensory deficits  LABORATORY DATA:  I have reviewed the data as listed  .    Latest Ref Rng & Units 02/10/2022    2:15 PM 02/03/2022    3:58 PM 10/07/2021   10:08 AM  CBC  WBC 4.0 - 10.5 K/uL 6.9  5.4  6.9   Hemoglobin 12.0 - 15.0 g/dL 15.6  16.1  15.0   Hematocrit 36.0 - 46.0 % 46.6  46.3  44.8   Platelets 150 - 400 K/uL 126  134  106     .    Latest Ref Rng & Units 02/10/2022    2:15 PM 02/03/2022    3:58 PM 10/07/2021   10:08 AM  CMP  Glucose 70 - 99 mg/dL 77  81  82   BUN 8 - 23 mg/dL 33  24  25   Creatinine 0.44 - 1.00 mg/dL 1.39  1.43  1.37   Sodium 135 - 145 mmol/L 137  137  137   Potassium 3.5 - 5.1 mmol/L 4.7  4.7  4.3   Chloride 98 - 111 mmol/L 101  102  104   CO2 22 - 32 mmol/L 29  25  26   $ Calcium 8.9 - 10.3 mg/dL 10.2  10.5  9.9   Total Protein 6.5 - 8.1 g/dL 6.7  6.8  6.6   Total Bilirubin 0.3 - 1.2 mg/dL 1.1  1.2  1.5   Alkaline Phos 38 - 126 U/L 54   43   AST 15 - 41 U/L 22  25  26   $ ALT 0 - 44 U/L 14  22  14     $ PATHOLOGY Surgical Pathology CASE: [id number] PATIENT: Jacqueline Hancock Bone Marrow Report     Clinical History:  lymphocytosis, possible lymphoma     DIAGNOSIS:  BONE MARROW, ASPIRATE, CLOT, CORE: -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  The latter is primarily seen by flow cytometry.  The findings favor mantle cell lymphoma but correlation with cytogenetic and FISH studies is recommended.  MICROSCOPIC DESCRIPTION:  PERIPHERAL BLOOD SMEAR: The red blood cells display prominent anisocytosis with macrocytic and normocytic cells.  There is mild to moderate poikilocytosis with teardrop cells, elliptocytes.  There is moderate polychromasia.  The white blood cells are increased in number with lymphocytosis characterized by predominance of small to medium sized lymphoid cells displaying high nuclear cytoplasmic ratio, round to irregular nuclei, coarse chromatin and small to inconspicuous nucleoli. The platelets are normal in number.     RADIOGRAPHIC STUDIES: I have personally reviewed the radiological images as listed and agreed with the findings in the report. No results found.  ASSESSMENT & PLAN:  82 year old woman with:   1.  CD20+ CD5+ve lymphoproliferative disorder presented with hemolytic anemia and splenomegaly in December 2021. Though discussed with patient that McGrath was neg and so Mantle cell lymphoma not confirm. Findings more consistent with CLL.  2.  Autoimmune hemolytic anemia: Area globin continues to be in normal range without any evidence of hemolysis.  3.  Mucositis: Improved after dose reduction of Imbruvica.  PLAN: -patient transferred care to me from Dr Alen Blew . EMR reviewed in details and confirmed with the patient. -Discussed lab results  from  today, 05/11/2022, with the patient. CBC shows hemoglobin of 17.1, hematocrit of 50.7, and platelets of 118. CMP shows increased creatinine of 1.25. -Discussed with the patient that oxycodone is probably causing the itchy skin rashes.  -Recommended Claritin and other otc shampoo that will help her skin rash/itchiness near her head.  -Recommended to start B-complex supplement.  -Recommended COVID-19 Booster, Influenza vaccine, RSV vaccine, and staying up to speed with other age appropriate vaccines. Patient has had most of her vaccines.  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 Since [facility] for t(11;14) was neg -educated the patient on CLL and educated the patient that Imbruvica 280 mg is medication for CLL as well as Mantle cell lymphoma.  -Schedule CT scan in 3 months prior to our next visit.  -patient might have yeast infection near her groin area. Will prescribe anti-fungal ointment.  -Continue  Imbruvica 280 mg.   FOLLOW-UP: CT chest/ABd/pelvis in 14 weeks RTC with Dr Irene Limbo with labs in 16 weeks  .The total time spent in the appointment was 51 minutes* .  All of the patient's questions were answered with apparent satisfaction. The patient knows to call the clinic with any problems, questions or concerns.   Sullivan Lone MD MS AAHIVMS [facility] Hematology/Oncology Physician [facility]  .*Total Encounter Time as defined by the Centers for Medicare and Medicaid Services includes, in addition to the face-to-face time of a patient visit (documented in the note above) non-face-to-face time: obtaining and reviewing outside history, ordering and reviewing medications, tests or procedures, care coordination (communications with other health care professionals or caregivers) and documentation in the medical record.  05/11/2022 10:50 AM   I, Cleda Mccreedy, am acting as a  Education administrator for Sullivan Lone, MD.

## 2022-05-12 ENCOUNTER — Other Ambulatory Visit: Payer: Medicare Other

## 2022-05-12 ENCOUNTER — Ambulatory Visit: Payer: Medicare Other | Admitting: Oncology

## 2022-05-16 ENCOUNTER — Other Ambulatory Visit: Payer: Self-pay

## 2022-05-18 ENCOUNTER — Ambulatory Visit: Payer: Self-pay

## 2022-05-18 NOTE — Patient Instructions (Signed)
Visit Information  Thank you for taking time to visit with me today. Please don't hesitate to contact me if I can be of assistance to you.   Following are the goals we discussed today:   Goals Addressed   None     Our next appointment is by telephone on 08/17/22 at 1000  Please call the care guide team at [phone] if you need to cancel or reschedule your appointment.   If you are experiencing a Mental Health or [place] or need someone to talk to, please call the Suicide and Crisis Lifeline: 988   The patient verbalized understanding of instructions, educational materials, and care plan provided today and agreed to receive a mailed copy of patient instructions, educational materials, and care plan.   The patient has been provided with contact information for the care management team and has been advised to call with any health related questions or concerns.   Jone Baseman, RN, MSN [place] Management Care Management Coordinator Direct Line [phone]

## 2022-05-18 NOTE — Patient Outreach (Signed)
  Care Coordination   Follow Up Visit Note   05/18/2022 Name: Jacqueline Hancock MRN: [id number] DOB: 06/04/1940  Jacqueline Hancock is a 82 y.o. year old female who sees Unk Pinto, MD for primary care. I spoke with  Devoria Glassing by phone today.  What matters to the patients health and wellness today?  none    Goals Addressed   None     SDOH assessments and interventions completed:  Yes  SDOH Interventions Today    Flowsheet Row Most Recent Value  SDOH Interventions   Housing Interventions Intervention Not Indicated  Utilities Interventions Intervention Not Indicated        Care Coordination Interventions:  Yes, provided   Follow up plan: Follow up call scheduled for May    Encounter Outcome:  Pt. Visit Completed   Jone Baseman, RN, MSN [place] Management Care Management Coordinator Direct Line [phone]

## 2022-05-19 ENCOUNTER — Other Ambulatory Visit: Payer: Self-pay

## 2022-05-20 ENCOUNTER — Other Ambulatory Visit: Payer: Self-pay

## 2022-05-20 ENCOUNTER — Other Ambulatory Visit (HOSPITAL_COMMUNITY): Payer: Self-pay

## 2022-05-23 NOTE — Progress Notes (Unsigned)
Assessment and Plan:  There are no diagnoses linked to this encounter.    Further disposition pending results of labs. Discussed med's effects and SE's.   Over 30 minutes of exam, counseling, chart review, and critical decision making was performed.   Future Appointments  Date Time Provider [place]  05/24/2022  2:30 PM Alycia Rossetti, NP [facility] None  06/07/2022  9:00 AM Carleene Mains, RPH [facility] None  08/16/2022  9:30 AM Alycia Rossetti, NP [facility] None  08/17/2022 10:00 AM Jon Billings, RN [facility] None  08/31/2022  9:30 AM [facility] None  08/31/2022 10:00 AM Brunetta Genera, MD [facility] None  02/14/2023 10:00 AM Alycia Rossetti, NP [facility] None    ------------------------------------------------------------------------------------------------------------------   HPI There were no vitals taken for this visit. 82 y.o.female presents for  Past Medical History:  Diagnosis Date   Dyspnea 2021   with exersion    Early cataracts, bilateral    Elevated hemoglobin A1c    Fibroids    GERD (gastroesophageal reflux disease)    Hyperlipidemia    Hypertension    Osteopenia      Allergies  Allergen Reactions   Nexium [Esomeprazole Magnesium] Other (See Comments)    Headache    Current Outpatient Medications on File Prior to Visit  Medication Sig   calcium carbonate (OSCAL) 1500 (600 Ca) MG TABS tablet Take 500 mg by mouth daily.   Cholecalciferol (VITAMIN D) 2000 UNITS tablet Take 4,000 Units by mouth daily.    clotrimazole (LOTRIMIN) 1 % cream Apply 1 Application topically 2 (two) times daily. To yeast infection rash in the groin   cyclobenzaprine (FLEXERIL) 5 MG tablet Take 1 tablet (5 mg total) by mouth 3 (three) times daily as needed for muscle spasms. (Patient not taking: Reported on 05/11/2022)   Ferrous Sulfate (IRON) 325 (65 Fe) MG TABS Take 325 mg by mouth daily.    fluticasone (FLONASE) 50 MCG/ACT nasal spray Place 2  sprays into both nostrils daily.   ibrutinib (IMBRUVICA) 280 MG tablet Take one tablet daily with a glass of water.   losartan (COZAAR) 50 MG tablet TAKE 1 TABLET(50 MG) BY MOUTH DAILY   nadolol (CORGARD) 20 MG tablet Take  1 tablet  Daily  for BP & Tremor   nystatin (MYCOSTATIN) 100000 UNIT/ML suspension 5 ml four times a day, retain in mouth as long as possible (Swish and Spit).  Use for 48 hours after symptoms resolve.   olopatadine (PATANOL) 0.1 % ophthalmic solution Place 1 drop into both eyes 2 (two) times daily.   [DISCONTINUED] gabapentin (NEURONTIN) 100 MG capsule Take 1 capsule 3 x /day for Neuropathy Pain (Patient not taking: Reported on 01/25/2020)   No current facility-administered medications on file prior to visit.    ROS: all negative except above.   Physical Exam:  There were no vitals taken for this visit.  General Appearance: Well nourished, in no apparent distress. Eyes: PERRLA, EOMs, conjunctiva no swelling or erythema Sinuses: No Frontal/maxillary tenderness ENT/Mouth: Ext aud canals clear, TMs without erythema, bulging. No erythema, swelling, or exudate on post pharynx.  Tonsils not swollen or erythematous. Hearing normal.  Neck: Supple, thyroid normal.  Respiratory: Respiratory effort normal, BS equal bilaterally without rales, rhonchi, wheezing or stridor.  Cardio: RRR with no MRGs. Brisk peripheral pulses without edema.  Abdomen: Soft, + BS.  Non tender, no guarding, rebound, hernias, masses. Lymphatics: Non tender without lymphadenopathy.  Musculoskeletal: Full ROM, 5/5 strength, normal gait.  Skin:  Warm, dry without rashes, lesions, ecchymosis.  Neuro: Cranial nerves intact. Normal muscle tone, no cerebellar symptoms. Sensation intact.  Psych: Awake and oriented X 3, normal affect, Insight and Judgment appropriate.     Alycia Rossetti, NP 12:21 PM [facility]

## 2022-05-24 ENCOUNTER — Encounter: Payer: Self-pay | Admitting: Nurse Practitioner

## 2022-05-24 ENCOUNTER — Ambulatory Visit (INDEPENDENT_AMBULATORY_CARE_PROVIDER_SITE_OTHER): Payer: Medicare Other | Admitting: Nurse Practitioner

## 2022-05-24 VITALS — BP 140/72 | HR 55 | Temp 98.4°F | Ht 59.0 in | Wt 152.8 lb

## 2022-05-24 DIAGNOSIS — C8310 Mantle cell lymphoma, unspecified site: Secondary | ICD-10-CM

## 2022-05-24 DIAGNOSIS — T7840XA Allergy, unspecified, initial encounter: Secondary | ICD-10-CM

## 2022-05-24 MED ORDER — DEXAMETHASONE 2 MG PO TABS: ORAL_TABLET | ORAL | 0 refills | Status: DC

## 2022-05-24 MED ORDER — MONTELUKAST SODIUM 10 MG PO TABS: 10.0000 mg | ORAL_TABLET | Freq: Every day | ORAL | 2 refills | Status: DC

## 2022-05-24 NOTE — Patient Instructions (Signed)
Get generic zyrtec over the counter and take twice a day for the next 2 weeks  Will start on a low dose steroid taper over the next 2 weeks Dexamethasone 2 mg 3 tabs daily x 5 days , 2 tabs daily x 5 days and then 1 tab daily x 5 days  Take singulair 1 tab at bedtime for the next 2 weeks  Follow up in 2 weeks. If symptoms worsen notify the office or go to the ER

## 2022-06-01 ENCOUNTER — Telehealth: Payer: Self-pay

## 2022-06-01 NOTE — Progress Notes (Unsigned)
Date: 06/01/22 12:25PM      Patient Name: Jacqueline Hancock             DOB:  2040/11/23       Review office visits, consults, Medication changes and Chronic conditions for Initial [facility] visit with CPP on 06/07/22. HC will call pt 1-2 days prior to visit to confirm/complete pre-call questions.    PCP- 05/24/22 Carlye Grippe, NP (PCP) rash - BP 140/72 HR 55. Start: Dexamethasone '2mg'$  take 3 tabs daily x5days, 2 tabs daily x5 days then 1 tab daily x5days. Montelukast '10mg'$  - 1 tab daily  02/03/22 Carlye Grippe, NP (PCP) - BP 128/72 HR 50. Start: Cyclobenzaprine '5mg'$  - 1 tab TID as needed Stop:  Ascorbic acid 1,'000mg'$ , Cephalexin '500mg'$ , Lidocaine 2%, Tramadol '50mg'$    Specialist- 05/11/22 Dr. Irene Limbo (Onc) - BP 155/70 HR 50. Start: Clotrimazole 1% - 1 application topically twice daily 04/13/22 Patient approved for grant for Ibrutinib 03/17/22 Dr. Charlesetta Garibaldi (GYN) - unable to note med changes 02/10/22 Dr. Alen Blew (HemOnc) - BP 149/63 HR 56. no med changes 01/24/22 Dr. Gershon Crane (Ophthalm) - no med changes   Hospital visits in the last 6 months? None   HC will call pt 1-2 days prior to visit to confirm visit/complete pre-call questions.   Pre Call Questions: Date: Time: Outcome: Successful/ Unsuccessful Confirmed appointment date/time with patient/caregiver? Date/time of the appointment: Visit type: Patient instructed to bring medications to appointment: Yes/No What, if any, problems do you have getting your medications from the pharmacy?   What is your top health concern to discuss at your upcoming visit?     Time Spent: 27mn HC - SShon Hough RGarvin

## 2022-06-06 NOTE — Progress Notes (Unsigned)
Assessment and Plan:  There are no diagnoses linked to this encounter.    Further disposition pending results of labs. Discussed med's effects and SE's.   Over 30 minutes of exam, counseling, chart review, and critical decision making was performed.   Future Appointments  Date Time Provider [place]  06/07/2022  9:00 AM [place], [place], [place] [facility] None  06/07/2022 10:45 AM Alycia Rossetti, NP [facility] None  08/16/2022  9:30 AM Alycia Rossetti, NP [facility] None  08/17/2022 10:00 AM Jon Billings, RN [facility] None  08/31/2022  9:30 AM [facility] None  08/31/2022 10:00 AM Brunetta Genera, MD [facility] None  02/14/2023 10:00 AM Alycia Rossetti, NP [facility] None    ------------------------------------------------------------------------------------------------------------------   HPI There were no vitals taken for this visit. 82 y.o.female presents for  Past Medical History:  Diagnosis Date   Dyspnea 2021   with exersion    Early cataracts, bilateral    Elevated hemoglobin A1c    Fibroids    GERD (gastroesophageal reflux disease)    Hyperlipidemia    Hypertension    Osteopenia      Allergies  Allergen Reactions   Nexium [Esomeprazole Magnesium] Other (See Comments)    Headache    Current Outpatient Medications on File Prior to Visit  Medication Sig   B Complex Vitamins (VITAMIN B-COMPLEX PO) Take by mouth.   calcium carbonate (OSCAL) 1500 (600 Ca) MG TABS tablet Take 500 mg by mouth daily.   Cholecalciferol (VITAMIN D) 2000 UNITS tablet Take 4,000 Units by mouth daily.    clotrimazole (LOTRIMIN) 1 % cream Apply 1 Application topically 2 (two) times daily. To yeast infection rash in the groin   cyclobenzaprine (FLEXERIL) 5 MG tablet Take 1 tablet (5 mg total) by mouth 3 (three) times daily as needed for muscle spasms.   dexamethasone (DECADRON) 2 MG tablet Take 3 tabs for 5 days, 2 tabs for 5 days 1 tab for 5 days. Take with food.    Ferrous Sulfate (IRON) 325 (65 Fe) MG TABS Take 325 mg by mouth daily.    fluticasone (FLONASE) 50 MCG/ACT nasal spray Place 2 sprays into both nostrils daily.   ibrutinib (IMBRUVICA) 280 MG tablet Take one tablet daily with a glass of water.   losartan (COZAAR) 50 MG tablet TAKE 1 TABLET(50 MG) BY MOUTH DAILY   montelukast (SINGULAIR) 10 MG tablet Take 1 tablet (10 mg total) by mouth daily.   nadolol (CORGARD) 20 MG tablet Take  1 tablet  Daily  for BP & Tremor   nystatin (MYCOSTATIN) 100000 UNIT/ML suspension 5 ml four times a day, retain in mouth as long as possible (Swish and Spit).  Use for 48 hours after symptoms resolve.   olopatadine (PATANOL) 0.1 % ophthalmic solution Place 1 drop into both eyes 2 (two) times daily.   [DISCONTINUED] gabapentin (NEURONTIN) 100 MG capsule Take 1 capsule 3 x /day for Neuropathy Pain (Patient not taking: Reported on 01/25/2020)   No current facility-administered medications on file prior to visit.    ROS: all negative except above.   Physical Exam:  There were no vitals taken for this visit.  General Appearance: Well nourished, in no apparent distress. Eyes: PERRLA, EOMs, conjunctiva no swelling or erythema Sinuses: No Frontal/maxillary tenderness ENT/Mouth: Ext aud canals clear, TMs without erythema, bulging. No erythema, swelling, or exudate on post pharynx.  Tonsils not swollen or erythematous. Hearing normal.  Neck: Supple, thyroid normal.  Respiratory: Respiratory effort normal, BS equal bilaterally  without rales, rhonchi, wheezing or stridor.  Cardio: RRR with no MRGs. Brisk peripheral pulses without edema.  Abdomen: Soft, + BS.  Non tender, no guarding, rebound, hernias, masses. Lymphatics: Non tender without lymphadenopathy.  Musculoskeletal: Full ROM, 5/5 strength, normal gait.  Skin: Warm, dry without rashes, lesions, ecchymosis.  Neuro: Cranial nerves intact. Normal muscle tone, no cerebellar symptoms. Sensation intact.  Psych: Awake  and oriented X 3, normal affect, Insight and Judgment appropriate.     Alycia Rossetti, NP 10:42 AM [facility]

## 2022-06-07 ENCOUNTER — Ambulatory Visit (INDEPENDENT_AMBULATORY_CARE_PROVIDER_SITE_OTHER): Payer: Medicare Other | Admitting: Nurse Practitioner

## 2022-06-07 ENCOUNTER — Ambulatory Visit: Payer: Medicare Other | Admitting: Pharmacist

## 2022-06-07 ENCOUNTER — Encounter: Payer: Self-pay | Admitting: Nurse Practitioner

## 2022-06-07 VITALS — BP 112/62 | HR 53 | Temp 97.5°F | Ht 59.0 in | Wt 150.8 lb

## 2022-06-07 DIAGNOSIS — I10 Essential (primary) hypertension: Secondary | ICD-10-CM

## 2022-06-07 DIAGNOSIS — Z79899 Other long term (current) drug therapy: Secondary | ICD-10-CM

## 2022-06-07 DIAGNOSIS — C8310 Mantle cell lymphoma, unspecified site: Secondary | ICD-10-CM

## 2022-06-07 DIAGNOSIS — E782 Mixed hyperlipidemia: Secondary | ICD-10-CM

## 2022-06-07 DIAGNOSIS — E559 Vitamin D deficiency, unspecified: Secondary | ICD-10-CM

## 2022-06-07 DIAGNOSIS — E669 Obesity, unspecified: Secondary | ICD-10-CM

## 2022-06-07 DIAGNOSIS — N183 Chronic kidney disease, stage 3 unspecified: Secondary | ICD-10-CM

## 2022-06-07 DIAGNOSIS — K219 Gastro-esophageal reflux disease without esophagitis: Secondary | ICD-10-CM

## 2022-06-07 DIAGNOSIS — T7840XD Allergy, unspecified, subsequent encounter: Secondary | ICD-10-CM

## 2022-06-07 NOTE — Progress Notes (Signed)
Initial Pharmacist Visit (CMCS)  Ray,Brandee  50 years, Female  DOB: Aug 19, 1940  M:   ____________________________________________Summary for PCP:  1. PCP- recommend repeat fasting lipid panel at upcoming visit on 5/14. LDL above goal       < 100 for CKD Stage 3. If above goal consider initiation of atorvastatin '10mg'$  daily with repeat labs in 6-8 weeks. 2. PCP- Patanol is not effective, pt would like an alternative allergy eye drop such as Lotemax  0.2% up to 4 times daily. - will send Hinton Dyer, NP message via [place]. 3. Open Gaps: Recommend RSV, Shingrix vaccines at upcoming appt in April, AWV needed for 2024. 4. Need ICD-10 code added for Tremor, Essential Tremor  Visit Details Patient scheduled for CMCS visit with the clinical pharmacist: Patient is referred for CMCS by their PCP and Clinical Pharmacist is under general PCP supervision. Visit Type: Clinic visit Date of Upcoming Visit: 06/07/2022 Engagement Notes Rosary Lively on 05/11/2022 09:01 AM 3/5'@9am'$  Patient Chart Prep [facility]) Chronic Conditions Patient's Chronic Conditions: Hypertension (HTN), Gastroesophageal Reflux Disease (GERD), Osteopenia or Osteoporosis, Chronic Kidney Disease (CKD), Hyperlipidemia/Dyslipidemia (HLD), Vitamins/Supplements Doctor and Hospital Visits Were there PCP Visits in last 6 months?: Yes PCP Visits details: 05/24/22 Carlye Grippe, NP (PCP) rash - BP 140/72 HR 55. Start: Dexamethasone '2mg'$  take 3 tabs daily x5days, 2 tabs daily x5 days then 1 tab daily x5days. Montelukast '10mg'$  - 1 tab daily 02/03/22 Carlye Grippe, NP (PCP) - BP 128/72 HR 50. Start: Cyclobenzaprine '5mg'$  - 1 tab TID as needed Stop:  Ascorbic acid 1,'000mg'$ , Cephalexin '500mg'$ , Lidocaine 2%, Tramadol '50mg'$  Were there Specialist Visits in last 6 months?: Yes Specialist Visits details: 05/11/22 Dr. Irene Limbo (Onc) - BP 155/70 HR 50. Start: Clotrimazole 1% - 1 application topically twice daily 04/13/22 Patient approved for grant for Ibrutinib 03/17/22 Dr. Charlesetta Garibaldi (GYN) -  unable to note med changes 02/10/22 Dr. Alen Blew (HemOnc) - BP 149/63 HR 56. no med changes 01/24/22 Dr. Gershon Crane (Ophthalm) - no med changes Was there a Hospital Visit in last 30 days?: No Were there other Hospital Visits in last 6 months?: No  Disease Assessments Visit Date Visit Completed on: 06/07/2022 Subjective Information Did patient bring medications to appointment?: Yes Subjective: Patient presents in office for Initial Visit for [facility]. Ms. Ebsen lives alone, but has 2 sons that live close by that check in on her often and 1 son that lives in [place] that she sees about 2x per year. She has 7 grandchildren and 10 great grandchildren. She is independent of ADLs, still drives, is able to provide her own transportation to appointments and pick up her own medications. She enjoys thrift shopping, completing puzzles, going to church, reading her Bible and watching some TV. She keeps to herself most of the time and stays home- her sons will take her out to eat occasionally which she enjoys. Lifestyle habits: Diet: enjoys fruit, avoids oranges due to reflux, cooks meals, eats out very rarely.  Exercise: walks around the house, does not like to walk outside.  Tobacco: denies Alcohol: denies Caffeine: decaf Recreational drugs: denies What is the patient's sleep pattern?: No sleep issues How many hours per night does patient typically sleep?: 8  SDOH: [place] Screening Tool ([email or web address]) SDOH questions completed during initial visit?: Yes What is your living situation today? (ref #1): I have a steady place to live Think about the place you live. Do you have problems with any of the following? (ref #2): None of the above Within  the past 12 months, you worried that your food would run out before you got money to buy more (ref #3): Never true Within the past 12 months, the food you  bought just didn't last and you didn't have money to get more (ref #4): Never true In the past 12 months, has lack of reliable transportation kept you from medical appointments, meetings, work or from getting things needed for daily living? (ref #5): Yes In the past 12 months, has the electric, gas, oil, or water company threatened to shut off services in your home? (ref #6): Yes How often does anyone, including family and friends, physically hurt you? (ref #7): Never (1) How often does anyone, including family and friends, insult or talk down to you? (ref #8): Never (1) How often does anyone, including friends and family, threaten you with harm? (ref #9): Never (1) How often does anyone, including family and friends, scream or curse at you? (ref #10): Never (1)  Medication Adherence Does the [facility] have access to medication refill history?: No Name and location of Current pharmacy[facility] Current Rx insurance plan: UHC Are meds synced by current pharmacy?: No Are meds delivered by current pharmacy?: No - delivery not available Would patient benefit from direct intervention of clinical lead in dispensing process to optimize clinical outcomes?: No Medication organization: vials- self manages, does not prefer pill organizer Assessment:: Adherent  Hypertension (HTN) Most Recent BP: 140/72 Most Recent HR: 55 taken on: 05/24/2022 Care Gap: Need BP documented or last BP 140/90 or higher: Needs to be addressed Assessed today?: Yes BP today is: 133/78 Heart Rate is: 52 Goal: <130/80 mmHG Is Patient checking BP at home?: Yes Patient home BP readings are ranging: 136/66 Has patient experienced hypotension, dizziness, falls or bradycardia?: No We discussed: Reducing the amount of salt intake to '1500mg'$ /per day., DASH diet:  following a diet emphasizing fruits and vegetables and low-fat dairy products along with whole grains, fish, poultry, and nuts. Reducing red meats and sugars.,  Proper Home BP Measurement, Increasing movement, Increasing exercise (walking, biking, swimming) to a goal of 30 minutes per day, as able based on current activity level and health or as directed by your healthcare provider., Contacting PCP office for signs and symptoms of high or low blood pressure (hypotension, dizziness, falls, headaches, edema) Assessment:: Uncontrolled Drug: Losartan '50mg'$  - 1 tab once daily Pharmacist Assessment: Appropriate, Query Effectiveness Drug: Nadolol 20 mg - 1 tablet daily Pharmacist Assessment: Appropriate, Query Effectiveness Plan/Follow up: Uncontrolled based on most recent clinic reading; reading today in clinic only slightly above goal < 130/80 along with home readings approaching goal. Continue Losartan + Nadolol (also used for tremor- need dx code) along with home monitoring with log. Follow up in 1 month with BP readings- has follow up scheduled in May with Dr. Melford Aase. Will send PCP readings for review and any medication recommendations at that time based on results. CMP recently obtained- 05/11/22.  Hyperlipidemia/Dyslipidemia (HLD) Last Lipid panel on: 02/03/2022 TC (Goal<200): 213 LDL: 135 HDL (Goal>40): 50 TG (Goal<150): 149 ASCVD 10-year risk?is:: N/A due to Age > 34 Assessed today?: Yes LDL Goal: <100 We discussed: Complications of hyperlipidemia and prevention methods with patient for 5-10 minutes, How a diet high in fruits/vegetables/nuts/whole grains/beans may help to reduce your cholesterol. Increasing soluble fiber intake.  Avoiding sugary foods and trans fat, limiting carbohydrates, and reducing portion sizes. Recommended increasing intake of healthy fats into their diet, Increasing exercise (walking, biking, swimming) to a goal of  30 minutes per day, as able based on current activity level and health or as directed by your healthcare provider Assessment:: Uncontrolled Plan/Follow up: Uncontrolled; would recommend LDL goal at least < 100 due to  Stage 3 CKD. Patient wishes to manage with diet and increased exercise at this time. Repeat fasting lipids in May- will send provider message with recommendation to consider statin therapy (atorvastatin) if LDL continues to be above goal with repeat labs in 6 weeks.  GERD Assessed today?: Yes Reflux symptoms experienced: After meals Does patient experience difficulty or pain with swallowing?: No Trigger foods: acidic foods, spicy foods Have these changes been successful in reducing reflux symptoms?: Yes We discussed: Identifying and avoiding / limiting trigger foods Assessment:: Controlled Drug: Tums (calcium carbonate) OTC- 1 tablet prn Pharmacist Assessment: Appropriate, Effective, Safe, Accessible Plan/Follow up: Controlled; no maintenance medication management at this time- will take occasional OTC Tums as needed. Continue to monitor OTC medication utilization and increase in symptoms or number of trigger foods.  Chronic kidney disease (CKD) Most Recent GFR: 43 taken on: 05/11/2022 Previous GFR: 38 taken on: 02/10/2022 Most recent microalbumin ratio: 17 tested on: 02/03/2022 Assessed today?: No  Osteopenia or Osteoporosis Most recent T-score: -0.8 taken on: 03/06/2019 Previous T-score: -0.7 taken on: 03/05/2018 Most recent Vitamin D 25-OH: 69 taken on: 02/03/2022 Previous Vitamin D 25-OH: 68 taken on: 08/05/2021 Assessed today?: No Drug: Calcium Carbonate 1500(600CA) - 1 tab once daily Pharmacist Assessment: Appropriate, Effective, Safe, Accessible Drug: Vitamin D 2,000unit - 2 tabs once daily Pharmacist Assessment: Appropriate, Effective, Safe, Accessible  Vitamins / Supplements We discussed: Confirming with pharmacist the safety of new vitamins / supplements with current medications before starting / adding to medication regimen Drug: Vitamin D 2,000unit - 2 tab once daily Pharmacist Assessment: Appropriate, Effective, Safe, Accessible Preventative Health Care Gap:  Colorectal cancer screening: Patient excluded from population (Age > 49, hx of colorectal cancer or colectomy, hospice services) Care Gap: Breast cancer screening: Patient excluded from population (Age > 36, hx of bilateral mastectomy, frailty, hospice services) Care Gap: Annual Wellness Visit (AWV): Needs to be addressed Immunizations needed: Tdap or Td, Zoster, Other Details: RSV Additional exercise counseling points. We discussed: decreasing sedentary behavior Additional diet counseling points. We discussed: aiming to consume at least 8 cups of water day, key components of the DASH diet  Clinical Summary Summary Next Pharmacist Follow Up: CPP- 3 month, HC- 1 month with BP readings Next AWV: 08/16/2022 Recommended CRN Follow-Up in: Not indicated at this time Physician Referral Statement:: I reaffirm my previous referral of patient for [facility] services  Pharmacist Interventions Intervention Details Pharmacist Interventions discussed: Yes Started Therapy: Preventative therapy, Immunizations recommended Monitoring: Preventative health screenings, Routine monitoring Education: Medication administration, Lifestyle modifications  Follow Ups CRN Follow Up and Interim Scheduling CRN visit Scheduled and protocol assigned: N/A Care Plan created and initiated: Done CTL Checklist No Task Details  CPP Chart Review Time: 15  CPP Office Visit: 64 CPP Office Visit Documentation: 25

## 2022-06-07 NOTE — Patient Instructions (Signed)

## 2022-06-08 ENCOUNTER — Other Ambulatory Visit (HOSPITAL_COMMUNITY): Payer: Self-pay

## 2022-06-09 ENCOUNTER — Other Ambulatory Visit: Payer: Self-pay

## 2022-06-17 ENCOUNTER — Other Ambulatory Visit: Payer: Self-pay

## 2022-06-21 ENCOUNTER — Other Ambulatory Visit (HOSPITAL_COMMUNITY): Payer: Self-pay

## 2022-07-07 ENCOUNTER — Telehealth: Payer: Self-pay

## 2022-07-07 NOTE — Progress Notes (Unsigned)
Patient: Jacqueline Hancock DOB: [birth year]  HC called pt to follow up on home blood pressure readings. [place]  Total time spent: 34min

## 2022-07-11 ENCOUNTER — Telehealth: Payer: Self-pay | Admitting: Pharmacist

## 2022-07-11 ENCOUNTER — Other Ambulatory Visit: Payer: Self-pay | Admitting: Hematology

## 2022-07-11 ENCOUNTER — Other Ambulatory Visit (HOSPITAL_COMMUNITY): Payer: Self-pay

## 2022-07-11 NOTE — Telephone Encounter (Signed)
HC-call patient for home BP readings   07/11/22 Patient reports she has only checked her blood pressure twice and it has been the same at: 137/60. She states that she does not check her blood pressure daily only occasionally. -Gardiner Fanti, [facility]

## 2022-07-12 ENCOUNTER — Other Ambulatory Visit: Payer: Self-pay

## 2022-07-12 ENCOUNTER — Other Ambulatory Visit (HOSPITAL_COMMUNITY): Payer: Self-pay

## 2022-07-15 ENCOUNTER — Other Ambulatory Visit: Payer: Self-pay

## 2022-07-22 ENCOUNTER — Other Ambulatory Visit: Payer: Self-pay | Admitting: Internal Medicine

## 2022-07-22 DIAGNOSIS — G250 Essential tremor: Secondary | ICD-10-CM

## 2022-07-22 DIAGNOSIS — I10 Essential (primary) hypertension: Secondary | ICD-10-CM

## 2022-07-28 DIAGNOSIS — H10503 Unspecified blepharoconjunctivitis, bilateral: Secondary | ICD-10-CM | POA: Diagnosis not present

## 2022-08-08 ENCOUNTER — Other Ambulatory Visit (HOSPITAL_COMMUNITY): Payer: Self-pay

## 2022-08-15 ENCOUNTER — Other Ambulatory Visit (HOSPITAL_COMMUNITY): Payer: Self-pay

## 2022-08-15 NOTE — Progress Notes (Unsigned)
6 MONTH FOLLOW UP  Assessment / Plan   Essential hypertension Continue current medications: Losartan 50 mg QD Monitor blood pressure at home; call if consistently over 130/80 Continue DASH diet.   Reminder to go to the ER if any CP, SOB, nausea, dizziness, severe HA, changes vision/speech, left arm numbness and tingling and jaw pain. -     CBC with Differential/Platelet -     COMPLETE METABOLIC PANEL WITH GFR   Hyperlipidemia, unspecified hyperlipidemia type Controlled with diet and exercise Discussed dietary and exercise modifications Low fat diet -     Lipid panel  Gastroesophageal reflux disease, unspecified whether esophagitis present Doing well at this time Diet discussed Monitor for triggers Avoid food with high acid content Avoid excessive cafeine Increase water intake   Vitamin D deficiency Continue supplementation to maintain goal of 70-100 Taking Vitamin D 5,000 IU daily -     VITAMIN D 25 Hydroxy (Vit-D Deficiency, Fractures)  Osteopenia, unspecified location DEXA 03/2019 osteopenia Continue calcium , vit d and weight bearing exercises Follows with GYN   Stage 3 chronic kidney disease, unspecified whether stage 3a or 3b CKD Increase fluids  Avoid NSAIDS Blood pressure control Monitor sugars  Will continue to monitor -     Urinalysis w microscopic + reflex cultur - Microalbumin/creatinine urine ratio   Abnormal glucose Discussed dietary and exercise modifications -     Hemoglobin A1c  Obesity Long discussion about weight loss, diet, and exercise Recommended diet heavy in fruits and veggies and low in animal meats, cheeses, and dairy products, appropriate calorie intake Patient will work on decreasing saturated fats and simple carbs Follow up at next visit   Medication management Magnesium  Mantle cell lymphoma  Autoimmune hemolytic anemia Continue Ibrutinib Continue to follow with Dr. Juliette Alcide, oncology    Over 40 minutes of face to face  interview, exam, counseling, chart review, and critical decision making was performed Future Appointments  Date Time Provider Department Center  08/16/2022  9:30 AM [name], NP [facility] None  08/17/2022 10:00 AM Fleeta Emmer, RN [facility] None  08/22/2022 12:30 PM [facility] 2 [facility] [place]  08/31/2022  9:30 AM [facility] None  08/31/2022 10:00 AM Johney Maine, MD [facility] None  02/14/2023 10:00 AM [name], NP [facility] None      Subjective:   Jacqueline Hancock is a 82 y.o.  female who presents for CPE and 3 month follow up on HTN, HLD, prediabetes/abnormal glucose, vitamin D def.   She is having right arm pain after a fall out of bed on 01/02/22. She is having pain in her shoulder and goes down right arm as well as down her right side and right leg.  Describes as tingling pain with occasional cramps.  Also notes that her feet and hands stay cold which has occurred since starting medication with Dr. Juliette Alcide on Ibrutinib. She has history of essential tremor and is taking Nadolol 20 mg QD.   She continues to have toenail fungus, does see podiatry.  She does have an appointment with Dr Juliette Alcide on Tuesday and will ask if she can take medication for toenail fungus.  She has had 2 ingrown toenails that have been removed from right foot   She is having difficulty swallowing, feels like food gets stuck in her chest.  BMI is There is no height or weight on file to calculate BMI., she is working on diet and exercise. Wt Readings from Last 3 Encounters:  06/07/22  150 lb 12.8 oz (68.4 kg)  05/24/22 152 lb 12.8 oz (69.3 kg)  05/11/22 153 lb 6.4 oz (69.6 kg)    She has not been checking her blood pressure at home, Bp controlled with Losartan 50 mg QD and Nadolol 20 mg QD, today their BP is  .  BP Readings from Last 3 Encounters:  06/07/22 112/62  05/24/22 (!) 140/72  05/11/22 (!) 155/70  She does workout, does it in her house. She denies chest pain,  shortness of breath, dizziness.    She is not on cholesterol medication . Her cholesterol is not at goal. She is limiting dairy and red meat and other saturated fats. The cholesterol last visit was:   Lab Results  Component Value Date   CHOL 213 (H) 02/03/2022   HDL 50 02/03/2022   LDLCALC 135 (H) 02/03/2022   TRIG 149 02/03/2022   CHOLHDL 4.3 02/03/2022   She has been working on diet and exercise for prediabetes, and denies paresthesia of the feet, polydipsia, polyuria and visual disturbances. Last A1C in the office was:  Lab Results  Component Value Date   HGBA1C 5.5 02/03/2022   She has been trying to drink more water.  Lab Results  Component Value Date   EGFR 37 (L) 02/03/2022    Patient is on Vitamin D supplement. Lab Results  Component Value Date   VD25OH 69 02/03/2022      Medication Review Current Outpatient Medications on File Prior to Visit  Medication Sig Dispense Refill   B Complex Vitamins (VITAMIN B-COMPLEX PO) Take by mouth.     calcium carbonate (OSCAL) 1500 (600 Ca) MG TABS tablet Take 500 mg by mouth daily.     Cholecalciferol (VITAMIN D) 2000 UNITS tablet Take 4,000 Units by mouth daily.      Ferrous Sulfate (IRON) 325 (65 Fe) MG TABS Take 325 mg by mouth daily.      fluticasone (FLONASE) 50 MCG/ACT nasal spray Place 2 sprays into both nostrils daily. 16 g 2   ibrutinib (IMBRUVICA) 280 MG tablet Take one tablet daily with a glass of water. 56 tablet 6   losartan (COZAAR) 50 MG tablet TAKE 1 TABLET(50 MG) BY MOUTH DAILY 90 tablet 1   montelukast (SINGULAIR) 10 MG tablet Take 1 tablet (10 mg total) by mouth daily. (Patient not taking: Reported on 06/07/2022) 30 tablet 2   nadolol (CORGARD) 20 MG tablet TAKE 1 TABLET BY MOUTH DAILY FOR BLOOD PRESSURE OR TREMORS 90 tablet 3   nystatin (MYCOSTATIN) 100000 UNIT/ML suspension 5 ml four times a day, retain in mouth as long as possible (Swish and Spit).  Use for 48 hours after symptoms resolve. 80 mL 0   olopatadine  (PATANOL) 0.1 % ophthalmic solution Place 1 drop into both eyes 2 (two) times daily. 5 mL 1   [DISCONTINUED] gabapentin (NEURONTIN) 100 MG capsule Take 1 capsule 3 x /day for Neuropathy Pain (Patient not taking: Reported on 01/25/2020) 270 capsule 0   No current facility-administered medications on file prior to visit.    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 [facility]- here for primary care 2. Dr. Nile Riggs, eye doctor, 01/24/22 3. Has partial, dentist, last visit 2 years  Patient Care Team: Lucky Cowboy, MD as PCP - General (Internal Medicine) Flo Shanks, MD as Consulting Physician (Otolaryngology) Sharrell Ku, MD as Consulting Physician (Gastroenterology) Pennie Rushing, Maris Berger, MD (Inactive) as Consulting Physician (Obstetrics and Gynecology) Jethro Bolus, MD as Consulting Physician (Ophthalmology) Charlett Nose, [facility] (Inactive) as Pharmacist (Pharmacist) Fleeta Emmer, RN as [facility] Care Management  Allergies Allergies  Allergen Reactions   Nexium [Esomeprazole Magnesium] Other (See Comments)    Headache    SURGICAL HISTORY She  has a past surgical history that includes Parotid gland tumor excision (2012); Abdominal hysterectomy; Appendectomy; Tonsillectomy and adenoidectomy; and Cataract extraction, bilateral (Bilateral, 09/2017). FAMILY HISTORY Her family  history includes Diabetes in her brother, brother, and sister; Heart disease in her brother; Hypertension in her father; Leukemia in her mother. SOCIAL HISTORY She  reports that she has never smoked. She has never used smokeless tobacco. She reports that she does not drink alcohol and does not use drugs.  Review of Systems  Constitutional:  Negative for chills and fever.       Cold hands/feet  HENT:  Negative for congestion, ear pain, hearing loss, sinus pain, sore throat and tinnitus.   Eyes:  Negative for blurred vision and double vision.  Respiratory:  Negative for cough, hemoptysis, sputum production, shortness of breath and wheezing.   Cardiovascular:  Negative for chest pain, palpitations and leg swelling.  Gastrointestinal:  Negative for abdominal pain, constipation, diarrhea, heartburn, nausea and vomiting.       Difficulty swallowing solid food and some pills  Genitourinary:  Negative for dysuria and urgency.  Musculoskeletal:  Positive for back pain and joint pain (right shoulder and hip). Negative for falls, myalgias and neck pain.  Skin:  Negative for itching and rash.       Toenail fungus right 3rd toe  Neurological:  Negative for dizziness, tingling, tremors, weakness and headaches.  Endo/Heme/Allergies:  Does not bruise/bleed easily.  Psychiatric/Behavioral:  Negative for depression and suicidal ideas. The patient is not nervous/anxious and does not have insomnia.       Objective:   There were no vitals filed for this visit.   There is no height or weight on file to calculate BMI.  General appearance: alert, no distress, WD/WN,  female HEENT: normocephalic, sclerae anicteric, TMs pearly, nares patent, no discharge or erythema, pharynx normal Oral cavity: MMM, no lesions Neck: supple, no lymphadenopathy, no thyromegaly, no masses Heart: RRR, normal S1, S2, no murmurs Lungs: CTA bilaterally, no wheezes, rhonchi, or rales Abdomen: +bs, soft, + epigastric tender, non  distended, no masses, no hepatomegaly, no splenomegaly. Abdomen  Musculoskeletal: nontender, no swelling, no obvious deformity. Muscle spasm of right neck Extremities: no edema, no cyanosis, no clubbing Pulses: 2+ symmetric, upper and lower extremities, normal cap refill Neurological: alert, oriented x 3, CN2-12 intact, strength normal upper extremities and lower extremities, sensation normal throughout, DTRs 2+ throughout, no cerebellar signs, gait normal Psychiatric: normal affect, behavior normal, pleasant  EKG: NSR, no ST changes Skin: warm and dry except hands and feet are cold to touch, onychomycosis of right 3rd toenail Breast: defer Gyn: defer Rectal: defer  EKG: Sinus bradycardia, no ST changes- on nadolol AAA: < 3 cm  Prajwal Fellner W.  [facility]  08/15/2022

## 2022-08-16 ENCOUNTER — Encounter: Payer: Self-pay | Admitting: Nurse Practitioner

## 2022-08-16 ENCOUNTER — Ambulatory Visit (INDEPENDENT_AMBULATORY_CARE_PROVIDER_SITE_OTHER): Payer: Medicare Other | Admitting: Nurse Practitioner

## 2022-08-16 VITALS — BP 136/78 | HR 65 | Temp 97.9°F | Resp 16 | Ht 59.0 in | Wt 156.4 lb

## 2022-08-16 DIAGNOSIS — Z79899 Other long term (current) drug therapy: Secondary | ICD-10-CM | POA: Diagnosis not present

## 2022-08-16 DIAGNOSIS — D5919 Other autoimmune hemolytic anemia: Secondary | ICD-10-CM | POA: Diagnosis not present

## 2022-08-16 DIAGNOSIS — C8310 Mantle cell lymphoma, unspecified site: Secondary | ICD-10-CM

## 2022-08-16 DIAGNOSIS — E782 Mixed hyperlipidemia: Secondary | ICD-10-CM | POA: Diagnosis not present

## 2022-08-16 DIAGNOSIS — L299 Pruritus, unspecified: Secondary | ICD-10-CM

## 2022-08-16 DIAGNOSIS — E669 Obesity, unspecified: Secondary | ICD-10-CM | POA: Diagnosis not present

## 2022-08-16 DIAGNOSIS — N183 Chronic kidney disease, stage 3 unspecified: Secondary | ICD-10-CM | POA: Diagnosis not present

## 2022-08-16 DIAGNOSIS — I10 Essential (primary) hypertension: Secondary | ICD-10-CM

## 2022-08-16 DIAGNOSIS — E559 Vitamin D deficiency, unspecified: Secondary | ICD-10-CM

## 2022-08-16 DIAGNOSIS — R7309 Other abnormal glucose: Secondary | ICD-10-CM

## 2022-08-16 DIAGNOSIS — K219 Gastro-esophageal reflux disease without esophagitis: Secondary | ICD-10-CM | POA: Diagnosis not present

## 2022-08-16 DIAGNOSIS — M8580 Other specified disorders of bone density and structure, unspecified site: Secondary | ICD-10-CM

## 2022-08-16 MED ORDER — MONTELUKAST SODIUM 10 MG PO TABS
10.0000 mg | ORAL_TABLET | Freq: Every day | ORAL | 2 refills | Status: DC
Start: 2022-08-16 — End: 2023-06-14

## 2022-08-16 NOTE — Patient Instructions (Signed)
Take generic Zyrtec every morning- get over the counter Take montelukast every night- called in to pharmacy  Please make sure to talk with Dr. Candise Che about these symptoms as they have started after initiation of Imbruvica

## 2022-08-17 ENCOUNTER — Ambulatory Visit: Payer: Self-pay

## 2022-08-17 LAB — CBC WITH DIFFERENTIAL/PLATELET
Absolute Monocytes: 872 cells/uL (ref 200–950)
Basophils Absolute: 40 cells/uL (ref 0–200)
Basophils Relative: 0.5 %
Eosinophils Absolute: 520 cells/uL — ABNORMAL HIGH (ref 15–500)
Eosinophils Relative: 6.5 %
HCT: 45.2 % — ABNORMAL HIGH (ref 35.0–45.0)
Hemoglobin: 15.2 g/dL (ref 11.7–15.5)
Lymphs Abs: 2696 cells/uL (ref 850–3900)
MCH: 30.6 pg (ref 27.0–33.0)
MCHC: 33.6 g/dL (ref 32.0–36.0)
MCV: 91.1 fL (ref 80.0–100.0)
MPV: 13.4 fL — ABNORMAL HIGH (ref 7.5–12.5)
Monocytes Relative: 10.9 %
Neutro Abs: 3872 cells/uL (ref 1500–7800)
Neutrophils Relative %: 48.4 %
Platelets: 135 10*3/uL — ABNORMAL LOW (ref 140–400)
RBC: 4.96 10*6/uL (ref 3.80–5.10)
RDW: 12.5 % (ref 11.0–15.0)
Total Lymphocyte: 33.7 %
WBC: 8 10*3/uL (ref 3.8–10.8)

## 2022-08-17 LAB — COMPLETE METABOLIC PANEL WITH GFR
AG Ratio: 1.8 (calc) (ref 1.0–2.5)
ALT: 22 U/L (ref 6–29)
AST: 31 U/L (ref 10–35)
Albumin: 4.4 g/dL (ref 3.6–5.1)
Alkaline phosphatase (APISO): 51 U/L (ref 37–153)
BUN/Creatinine Ratio: 19 (calc) (ref 6–22)
BUN: 30 mg/dL — ABNORMAL HIGH (ref 7–25)
CO2: 25 mmol/L (ref 20–32)
Calcium: 10.5 mg/dL — ABNORMAL HIGH (ref 8.6–10.4)
Chloride: 103 mmol/L (ref 98–110)
Creat: 1.55 mg/dL — ABNORMAL HIGH (ref 0.60–0.95)
Globulin: 2.5 g/dL (calc) (ref 1.9–3.7)
Glucose, Bld: 84 mg/dL (ref 65–99)
Potassium: 4.8 mmol/L (ref 3.5–5.3)
Sodium: 139 mmol/L (ref 135–146)
Total Bilirubin: 1.1 mg/dL (ref 0.2–1.2)
Total Protein: 6.9 g/dL (ref 6.1–8.1)
eGFR: 33 mL/min/{1.73_m2} — ABNORMAL LOW (ref >=60)

## 2022-08-17 LAB — HEMOGLOBIN A1C
Hgb A1c MFr Bld: 5.6 % of total Hgb (ref <5.7)
Mean Plasma Glucose: 114 mg/dL
eAG (mmol/L): 6.3 mmol/L

## 2022-08-17 LAB — TSH: TSH: 3 mIU/L (ref 0.40–4.50)

## 2022-08-17 LAB — LIPID PANEL
Cholesterol: 222 mg/dL — ABNORMAL HIGH (ref <200)
HDL: 52 mg/dL (ref >=50)
LDL Cholesterol (Calc): 140 mg/dL (calc) — ABNORMAL HIGH
Non-HDL Cholesterol (Calc): 170 mg/dL (calc) — ABNORMAL HIGH (ref <130)
Total CHOL/HDL Ratio: 4.3 (calc) (ref <5.0)
Triglycerides: 161 mg/dL — ABNORMAL HIGH (ref <150)

## 2022-08-17 NOTE — Patient Outreach (Deleted)
  Care Coordination   08/17/2022 Name: Jacqueline Hancock MRN: [id number] DOB: 1940/07/14   Care Coordination Outreach Attempts:  An unsuccessful telephone outreach was attempted today to offer the patient information about available care coordination services.  Follow Up Plan:  Additional outreach attempts will be made to offer the patient care coordination information and services.   Encounter Outcome:  No Answer   Care Coordination Interventions:  No, not indicated    Bary Leriche, RN, MSN [facility] Care Management Care Management Coordinator Direct Line [phone]

## 2022-08-17 NOTE — Patient Instructions (Signed)
Visit Information  Thank you for taking time to visit with me today. Please don't hesitate to contact me if I can be of assistance to you.   Following are the goals we discussed today:   Goals Addressed             This Visit's Progress    Health Maintenance       Patient Goals/Self Care Activities: -Patient/Caregiver will self-administer medications as prescribed as evidenced by self-report/primary caregiver report  -Patient/Caregiver will attend all scheduled provider appointments as evidenced by clinician review of documented attendance to scheduled appointments and patient/caregiver report -Patient/Caregiver will call provider office for new concerns or questions as evidenced by review of documented incoming telephone call notes and patient report  -Calls provider office for new concerns, questions, or BP outside discussed parameters -Checks BP and records as discussed -Follows a low sodium diet/DASH diet         Our next appointment is by telephone on 11/17/22 at 1000  Please call the care guide team at [phone] if you need to cancel or reschedule your appointment.   If you are experiencing a Mental Health or Behavioral Health Crisis or need someone to talk to, please call the Suicide and Crisis Lifeline: 988   The patient verbalized understanding of instructions, educational materials, and care plan provided today and agreed to receive a mailed copy of patient instructions, educational materials, and care plan.   The patient has been provided with contact information for the care management team and has been advised to call with any health related questions or concerns.   Bary Leriche, RN, MSN [facility] Care Management Care Management Coordinator Direct Line [phone]

## 2022-08-17 NOTE — Patient Outreach (Signed)
  Care Coordination   Follow Up Visit Note   08/17/2022 Name: Jacqueline Hancock MRN: [id number] DOB: Dec 16, 1940  Jacqueline Hancock is a 82 y.o. year old female who sees Lucky Cowboy, MD for primary care. I spoke with  [name] by phone today.  What matters to the patients health and wellness today?  Maintain health    Goals Addressed             This Visit's Progress    Health Maintenance       Patient Goals/Self Care Activities: -Patient/Caregiver will self-administer medications as prescribed as evidenced by self-report/primary caregiver report  -Patient/Caregiver will attend all scheduled provider appointments as evidenced by clinician review of documented attendance to scheduled appointments and patient/caregiver report -Patient/Caregiver will call provider office for new concerns or questions as evidenced by review of documented incoming telephone call notes and patient report  -Calls provider office for new concerns, questions, or BP outside discussed parameters -Checks BP and records as discussed -Follows a low sodium diet/DASH diet         SDOH assessments and interventions completed:  Yes  SDOH Interventions Today    Flowsheet Row Most Recent Value  SDOH Interventions   Housing Interventions Intervention Not Indicated  Transportation Interventions Intervention Not Indicated        Care Coordination Interventions:  Yes, provided   Follow up plan: Follow up call scheduled for August    Encounter Outcome:  Pt. Visit Completed

## 2022-08-18 NOTE — Progress Notes (Signed)
Patient is aware of lab results and instructions. -e welch

## 2022-08-22 ENCOUNTER — Ambulatory Visit (HOSPITAL_COMMUNITY)
Admission: RE | Admit: 2022-08-22 | Discharge: 2022-08-22 | Disposition: A | Discharge status: Home / Self Care | Payer: Medicare Other | Source: Ambulatory Visit | Attending: Hematology | Admitting: Hematology

## 2022-08-22 DIAGNOSIS — C8310 Mantle cell lymphoma, unspecified site: Secondary | ICD-10-CM | POA: Diagnosis not present

## 2022-08-22 DIAGNOSIS — C8590 Non-Hodgkin lymphoma, unspecified, unspecified site: Secondary | ICD-10-CM | POA: Diagnosis not present

## 2022-08-22 MED ORDER — IOHEXOL 9 MG/ML PO SOLN
500.0000 mL | ORAL | Status: AC
  Administered 2022-08-22: 1000 mL via ORAL

## 2022-08-22 MED ORDER — SODIUM CHLORIDE (PF) 0.9 % IJ SOLN
INTRAMUSCULAR | Status: AC
  Filled 2022-08-22: qty 50

## 2022-08-22 MED ORDER — IOHEXOL 9 MG/ML PO SOLN
ORAL | Status: AC
  Filled 2022-08-22: qty 1000

## 2022-08-22 MED ORDER — IOHEXOL 300 MG/ML  SOLN
75.0000 mL | Freq: Once | INTRAMUSCULAR | Status: AC | PRN
  Administered 2022-08-22: 75 mL via INTRAVENOUS

## 2022-08-23 ENCOUNTER — Telehealth: Payer: Self-pay | Admitting: Hematology

## 2022-08-31 ENCOUNTER — Inpatient Hospital Stay: Payer: Medicare Other

## 2022-08-31 ENCOUNTER — Inpatient Hospital Stay: Payer: Medicare Other | Admitting: Hematology

## 2022-09-05 ENCOUNTER — Other Ambulatory Visit (HOSPITAL_COMMUNITY): Payer: Self-pay

## 2022-09-06 ENCOUNTER — Other Ambulatory Visit (HOSPITAL_COMMUNITY): Payer: Self-pay

## 2022-09-08 ENCOUNTER — Other Ambulatory Visit (HOSPITAL_COMMUNITY): Payer: Self-pay

## 2022-09-09 ENCOUNTER — Other Ambulatory Visit: Payer: Self-pay

## 2022-09-09 DIAGNOSIS — C8310 Mantle cell lymphoma, unspecified site: Secondary | ICD-10-CM

## 2022-09-12 ENCOUNTER — Inpatient Hospital Stay: Payer: Medicare Other | Attending: Oncology

## 2022-09-12 ENCOUNTER — Inpatient Hospital Stay: Payer: Medicare Other | Admitting: Hematology

## 2022-09-12 VITALS — BP 154/58 | HR 55 | Temp 97.7°F | Resp 17 | Ht 59.0 in | Wt 155.6 lb

## 2022-09-12 DIAGNOSIS — K219 Gastro-esophageal reflux disease without esophagitis: Secondary | ICD-10-CM | POA: Insufficient documentation

## 2022-09-12 DIAGNOSIS — R609 Edema, unspecified: Secondary | ICD-10-CM | POA: Insufficient documentation

## 2022-09-12 DIAGNOSIS — E785 Hyperlipidemia, unspecified: Secondary | ICD-10-CM | POA: Diagnosis not present

## 2022-09-12 DIAGNOSIS — R21 Rash and other nonspecific skin eruption: Secondary | ICD-10-CM

## 2022-09-12 DIAGNOSIS — D5919 Other autoimmune hemolytic anemia: Secondary | ICD-10-CM | POA: Insufficient documentation

## 2022-09-12 DIAGNOSIS — C8590 Non-Hodgkin lymphoma, unspecified, unspecified site: Secondary | ICD-10-CM | POA: Diagnosis not present

## 2022-09-12 DIAGNOSIS — M8580 Other specified disorders of bone density and structure, unspecified site: Secondary | ICD-10-CM | POA: Diagnosis not present

## 2022-09-12 DIAGNOSIS — C9110 Chronic lymphocytic leukemia of B-cell type not having achieved remission: Secondary | ICD-10-CM | POA: Diagnosis not present

## 2022-09-12 DIAGNOSIS — Z79899 Other long term (current) drug therapy: Secondary | ICD-10-CM | POA: Insufficient documentation

## 2022-09-12 DIAGNOSIS — D219 Benign neoplasm of connective and other soft tissue, unspecified: Secondary | ICD-10-CM | POA: Diagnosis not present

## 2022-09-12 DIAGNOSIS — K1230 Oral mucositis (ulcerative), unspecified: Secondary | ICD-10-CM | POA: Insufficient documentation

## 2022-09-12 DIAGNOSIS — I10 Essential (primary) hypertension: Secondary | ICD-10-CM | POA: Diagnosis not present

## 2022-09-12 DIAGNOSIS — C8310 Mantle cell lymphoma, unspecified site: Secondary | ICD-10-CM

## 2022-09-12 LAB — CMP (CANCER CENTER ONLY)
ALT: 23 U/L (ref 0–44)
AST: 29 U/L (ref 15–41)
Albumin: 4.5 g/dL (ref 3.5–5.0)
Alkaline Phosphatase: 51 U/L (ref 38–126)
Anion gap: 6 (ref 5–15)
BUN: 23 mg/dL (ref 8–23)
CO2: 27 mmol/L (ref 22–32)
Calcium: 10.5 mg/dL — ABNORMAL HIGH (ref 8.9–10.3)
Chloride: 104 mmol/L (ref 98–111)
Creatinine: 1.58 mg/dL — ABNORMAL HIGH (ref 0.44–1.00)
GFR, Estimated: 32 mL/min — ABNORMAL LOW (ref >60)
Glucose, Bld: 86 mg/dL (ref 70–99)
Potassium: 4.8 mmol/L (ref 3.5–5.1)
Sodium: 137 mmol/L (ref 135–145)
Total Bilirubin: 1.3 mg/dL — ABNORMAL HIGH (ref 0.3–1.2)
Total Protein: 7.1 g/dL (ref 6.5–8.1)

## 2022-09-12 LAB — CBC WITH DIFFERENTIAL (CANCER CENTER ONLY)
Abs Immature Granulocytes: 0.03 10*3/uL (ref 0.00–0.07)
Basophils Absolute: 0.1 10*3/uL (ref 0.0–0.1)
Basophils Relative: 1 %
Eosinophils Absolute: 0.4 10*3/uL (ref 0.0–0.5)
Eosinophils Relative: 4 %
HCT: 44.4 % (ref 36.0–46.0)
Hemoglobin: 15.1 g/dL — ABNORMAL HIGH (ref 12.0–15.0)
Immature Granulocytes: 0 %
Lymphocytes Relative: 41 %
Lymphs Abs: 4.2 10*3/uL — ABNORMAL HIGH (ref 0.7–4.0)
MCH: 31.5 pg (ref 26.0–34.0)
MCHC: 34 g/dL (ref 30.0–36.0)
MCV: 92.5 fL (ref 80.0–100.0)
Monocytes Absolute: 1.2 10*3/uL — ABNORMAL HIGH (ref 0.1–1.0)
Monocytes Relative: 12 %
Neutro Abs: 4.3 10*3/uL (ref 1.7–7.7)
Neutrophils Relative %: 42 %
Platelet Count: 145 10*3/uL — ABNORMAL LOW (ref 150–400)
RBC: 4.8 MIL/uL (ref 3.87–5.11)
RDW: 12.7 % (ref 11.5–15.5)
WBC Count: 10.1 10*3/uL (ref 4.0–10.5)
nRBC: 0 % (ref 0.0–0.2)

## 2022-09-12 MED ORDER — PREDNISONE 10 MG PO TABS: ORAL_TABLET | ORAL | 0 refills | Status: AC

## 2022-09-12 NOTE — Progress Notes (Signed)
HEMATOLOGY/ONCOLOGY CLINIC NOTE  Date of Service: 09/12/22  Patient Care Team: Lucky Cowboy, MD as PCP - General (Internal Medicine) Flo Shanks, MD as Consulting Physician (Otolaryngology) Sharrell Ku, MD as Consulting Physician (Gastroenterology) Pennie Rushing Maris Berger, MD (Inactive) as Consulting Physician (Obstetrics and Gynecology) Jethro Bolus, MD as Consulting Physician (Ophthalmology) Charlett Nose, [facility] (Inactive) as Pharmacist (Pharmacist) Fleeta Emmer, RN as [facility] Care Management  CHIEF COMPLAINTS/PURPOSE OF CONSULTATION:  NHL  Prior Therapy:  She is status post bone marrow biopsy completed on March 05, 2020.    Prednisone 60 mg daily in the January 2022 to treat her hemolytic anemia.  This was tapered off after complete hematological response.  HISTORY OF PRESENTING ILLNESS:   Jacqueline Hancock is a wonderful 82 y.o. female who is here for continued evaluation and management of NHL, unspecified body region. Patient was following up with Dr. Clelia Croft.   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 BM Bx Cyclin D1 IHC +ve byt FISH neg . She has presented with splenomegaly, anemia, and lymphocytosis. 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   Patient's current treatment is Imbruvica 280 mg. She was started on Imbruvica 560 mg on January 2022, which was then reduced to 280 mg in July 2023.   Patient was last seen by Dr. Clelia Croft on 02/10/2022 and she was doing well overall.   Patient reports she has been doing well overall since our last visit. She does complains of itchy skin rashes, mostly around the face and head, for around 2-3 weeks ago. Patient notes that she was started couple of new medications from other physicians, but she is unsure of the names. Patient notes that she was started on oxycodone around 3-4 weeks ago. She discontinued oxycodone.   She  notes that there is redness around her face, which is not new.   Patient is currently taking Imbruvica 280 mg as prescribed and has been tolerating it well without any severe toxicities.   She is currently taking Iron supplement, calcium supplement, and Vitamin-D supplement.   She denies fever, chills, night sweats, unexpected weight loss, back pain, abdominal pain, chest pain, or leg swelling. Patient does complain of occasional abdominal pain.   Patient notes she had a surgery near her right neck/ear area to remove an enlarged lymph node couple years ago. She complains of mild occasional pain near the site.  INTERVAL HISTORY:  Jacqueline Hancock is a wonderful 82 y.o. female who is here for continued evaluation and management of NHL, unspecified body region. She was last seen by me on 05/11/2022 and complained of itchy skin rashes, redness around the face, occasional abdominal pain, mild occasional pain near surgery site that removed an enlarged lymph node.   Patient notes she has been doing well overall since our last visit. She does complain of skin rashes with itchiness throughout the body, mostly near her scalp and face. Patient notes she used to have these symptoms before but has worsened after dose modification of Ibrutinib. She notes scaliness on the scalp and redness on the face after starting Ibrutinib. She also notes easily bruising and bleeding.   Patient has been to her PCP regarding skin rashes, who notes it is due to Ibrutinib.  She complains of a mild cough, intermittent mouth sores, and intermittent bilateral leg edema. Patient reports sharp pain down the right side of the neck after her ear surgery.  Patient has never seen Rheumatologist.  Patient denies any new lumps/bumps, new medications, back pain, SOB, any new infection issues, abdominal pain, chest pain, fever, chills, night sweats, or unexpected weight loss.    MEDICAL HISTORY:  Past Medical History:  Diagnosis Date    Dyspnea 2021   with exersion    Early cataracts, bilateral    Elevated hemoglobin A1c    Fibroids    GERD (gastroesophageal reflux disease)    Hyperlipidemia    Hypertension    Osteopenia     SURGICAL HISTORY: Past Surgical History:  Procedure Laterality Date   ABDOMINAL HYSTERECTOMY     APPENDECTOMY     CATARACT EXTRACTION, BILATERAL Bilateral 09/2017   Dr. 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   TONSILLECTOMY AND ADENOIDECTOMY      SOCIAL HISTORY: Social History   Socioeconomic History   Marital status: Married    Spouse name: Not on file   Number of children: Not on file   Years of education: Not on file   Highest education level: Not on file  Occupational History   Not on file  Tobacco Use   Smoking status: Never   Smokeless tobacco: Never  Vaping Use   Vaping Use: Never used  Substance and Sexual Activity   Alcohol use: No   Drug use: Never   Sexual activity: Not on file  Other Topics Concern   Not on file  Social History Narrative   Not on file   Social Determinants of Health   Financial Resource Strain: Not on file  Food Insecurity: No Food Insecurity (07/20/2021)   Hunger Vital Sign    Worried About Running Out of Food in the Last Year: Never true    Ran Out of Food in the Last Year: Never true  Transportation Needs: No Transportation Needs (07/20/2021)   PRAPARE - Administrator, Civil Service (Medical): No    Lack of Transportation (Non-Medical): No  Physical Activity: Not on file  Stress: Not on file  Social Connections: Not on file  Intimate Partner Violence: Not on file    FAMILY HISTORY: Family History  Problem Relation Age of Onset   Leukemia Mother    Hypertension Father    Diabetes Sister    Heart disease Brother    Diabetes Brother    Diabetes Brother     ALLERGIES:  is allergic to nexium [esomeprazole magnesium].  MEDICATIONS:  Current Outpatient Medications  Medication Sig Dispense Refill    calcium carbonate (OSCAL) 1500 (600 Ca) MG TABS tablet Take 500 mg by mouth daily.     Cholecalciferol (VITAMIN D) 2000 UNITS tablet Take 4,000 Units by mouth daily.      cyclobenzaprine (FLEXERIL) 5 MG tablet Take 1 tablet (5 mg total) by mouth 3 (three) times daily as needed for muscle spasms. 60 tablet 0   Ferrous Sulfate (IRON) 325 (65 Fe) MG TABS Take 325 mg by mouth daily.      fluticasone (FLONASE) 50 MCG/ACT nasal spray Place 2 sprays into both nostrils daily. 16 g 2   ibrutinib (IMBRUVICA) 280 MG tablet Take one tablet daily with a glass of water. 56 tablet 6   losartan (COZAAR) 50 MG tablet TAKE 1 TABLET(50 MG) BY MOUTH DAILY 90 tablet 1   nadolol (CORGARD) 20 MG tablet Take  1 tablet  Daily  for BP & Tremor 90 tablet 3   nystatin (MYCOSTATIN) 100000 UNIT/ML suspension 5 ml four times  a day, retain in mouth as long as possible (Swish and Spit).  Use for 48 hours after symptoms resolve. 80 mL 0   olopatadine (PATANOL) 0.1 % ophthalmic solution Place 1 drop into both eyes 2 (two) times daily. 5 mL 1   No current facility-administered medications for this visit.    REVIEW OF SYSTEMS:    10 Point review of Systems was done is negative except as noted above.  PHYSICAL EXAMINATION: ECOG PERFORMANCE STATUS: 2 - Symptomatic, <50% confined to bed  . Vitals:   05/11/22 1217  BP: (!) 155/70  Pulse: (!) 50  Resp: 18  Temp: (!) 97.5 F (36.4 C)  SpO2: 100%   Filed Weights   05/11/22 1217  Weight: 153 lb 6.4 oz (69.6 kg)   .Body mass index is 30.98 kg/m.  GENERAL:alert, in no acute distress and comfortable SKIN: no acute rashes, no significant lesions EYES: conjunctiva are pink and non-injected, sclera anicteric OROPHARYNX: MMM, no exudates, no oropharyngeal erythema or ulceration NECK: supple, no JVD LYMPH:  no palpable lymphadenopathy in the cervical, axillary or inguinal regions LUNGS: clear to auscultation b/l with normal respiratory effort HEART: regular rate &  rhythm ABDOMEN:  normoactive bowel sounds , non tender, not distended. Extremity: no pedal edema PSYCH: alert & oriented x 3 with fluent speech NEURO: no focal motor/sensory deficits  LABORATORY DATA:  I have reviewed the data as listed  .    Latest Ref Rng & Units 09/12/2022    1:32 PM 08/16/2022    9:44 AM 05/11/2022   11:46 AM  CBC  WBC 4.0 - 10.5 K/uL 10.1  8.0  7.4   Hemoglobin 12.0 - 15.0 g/dL 16.1  09.6  04.5   Hematocrit 36.0 - 46.0 % 44.4  45.2  50.7   Platelets 150 - 400 K/uL 145  135  118     .    Latest Ref Rng & Units 09/12/2022    1:32 PM 08/16/2022    9:44 AM 05/11/2022   11:46 AM  CMP  Glucose 70 - 99 mg/dL 86  84  78   BUN 8 - 23 mg/dL 23  30  27    Creatinine 0.44 - 1.00 mg/dL 4.09  8.11  9.14   Sodium 135 - 145 mmol/L 137  139  135   Potassium 3.5 - 5.1 mmol/L 4.8  4.8  4.8   Chloride 98 - 111 mmol/L 104  103  104   CO2 22 - 32 mmol/L 27  25  22    Calcium 8.9 - 10.3 mg/dL 78.2  95.6  21.3   Total Protein 6.5 - 8.1 g/dL 7.1  6.9  6.9   Total Bilirubin 0.3 - 1.2 mg/dL 1.3  1.1  1.5   Alkaline Phos 38 - 126 U/L 51   56   AST 15 - 41 U/L 29  31  23    ALT 0 - 44 U/L 23  22  12      PATHOLOGY Surgical Pathology CASE: [id number] PATIENT: Andrey Devenport Bone Marrow Report     Clinical History: lymphocytosis, possible lymphoma     DIAGNOSIS:  BONE MARROW, ASPIRATE, CLOT, CORE: -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  The latter is primarily seen by flow cytometry.  The findings  favor mantle cell lymphoma but correlation with cytogenetic and FISH studies is recommended.  MICROSCOPIC DESCRIPTION:  PERIPHERAL BLOOD SMEAR: The  red blood cells display prominent anisocytosis with macrocytic and normocytic cells.  There is mild to moderate poikilocytosis with teardrop cells, elliptocytes.  There is moderate polychromasia.  The white blood cells are increased in number with lymphocytosis characterized by predominance of small to medium sized lymphoid cells displaying high nuclear cytoplasmic ratio, round to irregular nuclei, coarse chromatin and small to inconspicuous nucleoli. The platelets are normal in number.     RADIOGRAPHIC STUDIES: I have personally reviewed the radiological images as listed and agreed with the findings in the report. No results found.  ASSESSMENT & PLAN:   82 y.o. woman with:   1.  CD20+ CD5+ve lymphoproliferative disorder presented with hemolytic anemia and splenomegaly in December 2021. Though discussed with patient that FISH was neg and so Mantle cell lymphoma not confirm. Findings more consistent with CLL.  2.  Autoimmune hemolytic anemia: Area globin continues to be in normal range without any evidence of hemolysis.  3.  Mucositis: Improved after dose reduction of Imbruvica.  PLAN: -Discussed lab results today, 09/12/2022, with patient. CBC shows elevated hemoglobin at 15.1, and decreased Platelets ar 145 K. CMP shows elevated Creatinine at 1.58, elevated calcium at 10.5, and elevated total bilirubin at 1.3.  -Discussed the CT scan result from 08/22/2022 in detail with the patient. Result shows normal lymph nodes but there is shadowing of the lower lung. -Patient has toxicities to Ibrutinib including skin rashes throughout the body and bilateral leg swelling.  -Patient will discontinue Ibrutinib due to skin rashes, leg swelling, and Ct scan which showed lung changes.  -Discussed the option of getting referred to Pulmonologist regarding CT scan results showing lung abnormality. Patient agrees.  -Will Refer patient to Pulmonologist to further evaluate the lungs. -Advised patient to  follow-up with PCP regarding neck pain -Will prescribe Prednisone to help improve skin rashes, which she should start tomorrow morning. -Educated the patient on the side effects of Prednisone. -Advised the patient to moisturize the skin, wear loose clothes, avoid sun exposure, and stay well hydrated.  -Informed the patient if itchiness of skin continues to take an OTC anti-itch ointment like sarna, Benadryl, or Claritin -Educated the patient that Prednisone can cause acid reflux and she can take OTC acid reflux medication such as Pepcid.  -Answered all of the patient's questions. -Discussed the option of a follow-up with a dermatologist if skin condition does not improve. We will wait for the next visit until dermatologist referral.  -Continue to take B-complex supplement.   FOLLOW-UP: RTC with Dr. Candise Che with labs in 1 month   The total time spent in the appointment was 30 minutes* .  All of the patient's questions were answered with apparent satisfaction. The patient knows to call the clinic with any problems, questions or concerns.   Wyvonnia Lora MD MS AAHIVMS [facility] Hematology/Oncology Physician [facility]  .*Total Encounter Time as defined by the Centers for Medicare and Medicaid Services includes, in addition to the face-to-face time of a patient visit (documented in the note above) non-face-to-face time: obtaining and reviewing outside history, ordering and reviewing medications, tests or procedures, care coordination (communications with other health care professionals or caregivers) and documentation in the medical record.   I,Param Shah,acting as a Neurosurgeon for Wyvonnia Lora, MD.,have documented all relevant documentation on the behalf of Wyvonnia Lora, MD,as directed by  Wyvonnia Lora, MD while  in the presence of Wyvonnia Lora, MD.  .I have reviewed the above documentation for accuracy and completeness, and I agree with the above. Johney Maine MD

## 2022-09-14 ENCOUNTER — Telehealth: Payer: Self-pay | Admitting: Hematology

## 2022-10-04 ENCOUNTER — Other Ambulatory Visit (HOSPITAL_COMMUNITY): Payer: Self-pay

## 2022-10-05 ENCOUNTER — Other Ambulatory Visit (HOSPITAL_COMMUNITY): Payer: Self-pay

## 2022-10-07 ENCOUNTER — Other Ambulatory Visit (HOSPITAL_COMMUNITY): Payer: Self-pay

## 2022-10-13 ENCOUNTER — Other Ambulatory Visit: Payer: Self-pay

## 2022-10-13 DIAGNOSIS — C9110 Chronic lymphocytic leukemia of B-cell type not having achieved remission: Secondary | ICD-10-CM

## 2022-10-14 ENCOUNTER — Other Ambulatory Visit: Payer: Self-pay

## 2022-10-14 ENCOUNTER — Inpatient Hospital Stay: Payer: Medicare Other | Admitting: Hematology

## 2022-10-14 ENCOUNTER — Inpatient Hospital Stay: Payer: Medicare Other | Attending: Hematology

## 2022-10-14 VITALS — BP 154/61 | HR 81 | Temp 98.1°F | Resp 18 | Wt 150.8 lb

## 2022-10-14 DIAGNOSIS — D5919 Other autoimmune hemolytic anemia: Secondary | ICD-10-CM | POA: Insufficient documentation

## 2022-10-14 DIAGNOSIS — R21 Rash and other nonspecific skin eruption: Secondary | ICD-10-CM | POA: Insufficient documentation

## 2022-10-14 DIAGNOSIS — R918 Other nonspecific abnormal finding of lung field: Secondary | ICD-10-CM

## 2022-10-14 DIAGNOSIS — Z806 Family history of leukemia: Secondary | ICD-10-CM | POA: Diagnosis not present

## 2022-10-14 DIAGNOSIS — C9110 Chronic lymphocytic leukemia of B-cell type not having achieved remission: Secondary | ICD-10-CM | POA: Diagnosis not present

## 2022-10-14 LAB — CBC WITH DIFFERENTIAL (CANCER CENTER ONLY)
Abs Immature Granulocytes: 0.01 10*3/uL (ref 0.00–0.07)
Basophils Absolute: 0 10*3/uL (ref 0.0–0.1)
Basophils Relative: 0 %
Eosinophils Absolute: 0.1 10*3/uL (ref 0.0–0.5)
Eosinophils Relative: 3 %
HCT: 42.4 % (ref 36.0–46.0)
Hemoglobin: 13.9 g/dL (ref 12.0–15.0)
Immature Granulocytes: 0 %
Lymphocytes Relative: 28 %
Lymphs Abs: 1.1 10*3/uL (ref 0.7–4.0)
MCH: 31 pg (ref 26.0–34.0)
MCHC: 32.8 g/dL (ref 30.0–36.0)
MCV: 94.4 fL (ref 80.0–100.0)
Monocytes Absolute: 0.4 10*3/uL (ref 0.1–1.0)
Monocytes Relative: 10 %
Neutro Abs: 2.3 10*3/uL (ref 1.7–7.7)
Neutrophils Relative %: 59 %
Platelet Count: 115 10*3/uL — ABNORMAL LOW (ref 150–400)
RBC: 4.49 MIL/uL (ref 3.87–5.11)
RDW: 13.1 % (ref 11.5–15.5)
WBC Count: 4 10*3/uL (ref 4.0–10.5)
nRBC: 0 % (ref 0.0–0.2)

## 2022-10-14 LAB — CMP (CANCER CENTER ONLY)
ALT: 24 U/L (ref 0–44)
AST: 22 U/L (ref 15–41)
Albumin: 3.8 g/dL (ref 3.5–5.0)
Alkaline Phosphatase: 48 U/L (ref 38–126)
Anion gap: 8 (ref 5–15)
BUN: 19 mg/dL (ref 8–23)
CO2: 23 mmol/L (ref 22–32)
Calcium: 9.7 mg/dL (ref 8.9–10.3)
Chloride: 106 mmol/L (ref 98–111)
Creatinine: 1.24 mg/dL — ABNORMAL HIGH (ref 0.44–1.00)
GFR, Estimated: 43 mL/min — ABNORMAL LOW (ref >60)
Glucose, Bld: 129 mg/dL — ABNORMAL HIGH (ref 70–99)
Potassium: 4.4 mmol/L (ref 3.5–5.1)
Sodium: 137 mmol/L (ref 135–145)
Total Bilirubin: 0.7 mg/dL (ref 0.3–1.2)
Total Protein: 6.1 g/dL — ABNORMAL LOW (ref 6.5–8.1)

## 2022-10-14 NOTE — Progress Notes (Signed)
HEMATOLOGY/ONCOLOGY CLINIC NOTE  Date of Service: 10/14/22  Patient Care Team: Lucky Cowboy, MD as PCP - General (Internal Medicine) Flo Shanks, MD as Consulting Physician (Otolaryngology) Sharrell Ku, MD as Consulting Physician (Gastroenterology) Pennie Rushing Maris Berger, MD (Inactive) as Consulting Physician (Obstetrics and Gynecology) Jethro Bolus, MD as Consulting Physician (Ophthalmology) Charlett Nose, [facility] (Inactive) as Pharmacist (Pharmacist) Fleeta Emmer, RN as [facility] Care Management  CHIEF COMPLAINTS/PURPOSE OF CONSULTATION:  F/u for CLL/SLL  Prior Therapy:  She is status post bone marrow biopsy completed on March 05, 2020.    Prednisone 60 mg daily in the January 2022 to treat her hemolytic anemia.  This was tapered off after complete hematological response.  HISTORY OF PRESENTING ILLNESS:   Jacqueline Hancock is a wonderful 82 y.o. female who is here for continued evaluation and management of NHL, unspecified body region. Patient was following up with Dr. Clelia Croft.   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 BM Bx Cyclin D1 IHC +ve byt FISH neg . She has presented with splenomegaly, anemia, and lymphocytosis. 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   Patient's current treatment is Imbruvica 280 mg. She was started on Imbruvica 560 mg on January 2022, which was then reduced to 280 mg in July 2023.   Patient was last seen by Dr. Clelia Croft on 02/10/2022 and she was doing well overall.   Patient reports she has been doing well overall since our last visit. She does complains of itchy skin rashes, mostly around the face and head, for around 2-3 weeks ago. Patient notes that she was started couple of new medications from other physicians, but she is unsure of the names. Patient notes that she was started on oxycodone around 3-4 weeks ago. She discontinued oxycodone.    She notes that there is redness around her face, which is not new.   Patient is currently taking Imbruvica 280 mg as prescribed and has been tolerating it well without any severe toxicities.   She is currently taking Iron supplement, calcium supplement, and Vitamin-D supplement.   She denies fever, chills, night sweats, unexpected weight loss, back pain, abdominal pain, chest pain, or leg swelling. Patient does complain of occasional abdominal pain.   Patient notes she had a surgery near her right neck/ear area to remove an enlarged lymph node couple years ago. She complains of mild occasional pain near the site.  INTERVAL HISTORY:  Jacqueline Hancock is a wonderful 82 y.o. 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 She was last seen by me on 09/12/2022 and complained of skin rashes with itchiness throughout the body, easily bleeding/bruising, scaly scalp, facial redness, sharp right-sided neck pain, mild cough, intermittent mouth sores, and intermittent bilateral leg edema.  Today, she complains of mild itchiness with her skin rashes. She reports that her pruritus continues to persist in her head and back though rash has resolved and is not visible. Her skin rash in other areas has resolved. Patient has been applying Sarna anti-itch cream regularly.  She denies any environmental allergies or product allergy. Patient is not taking Singulair or Claritin at this time.   She reports coughing sometimes with her rhinorrhea nasal allergies. She denies any phlegm. Flonase does improve symptoms.  Patient tolerated Prednisone well with no toxicities. Prednisone did improve her rash. She reports that she only took 2 tablets daily for 3 weeks rather than 4 weeks  due to running out of the medication. She denies taking any extra doses.  She denies any fever, chills, night sweats, new leg swelling, lumps/bumps, back pain, abdominal pain, or SOB. She reports that she is eating and  sleeping well.  MEDICAL HISTORY:  Past Medical History:  Diagnosis Date   Dyspnea 2021   with exersion    Early cataracts, bilateral    Elevated hemoglobin A1c    Fibroids    GERD (gastroesophageal reflux disease)    Hyperlipidemia    Hypertension    Osteopenia     SURGICAL HISTORY: Past Surgical History:  Procedure Laterality Date   ABDOMINAL HYSTERECTOMY     APPENDECTOMY     CATARACT EXTRACTION, BILATERAL Bilateral 09/2017   Dr. 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   TONSILLECTOMY AND ADENOIDECTOMY      SOCIAL HISTORY: Social History   Socioeconomic History   Marital status: Widowed    Spouse name: Not on file   Number of children: Not on file   Years of education: Not on file   Highest education level: Not on file  Occupational History   Not on file  Tobacco Use   Smoking status: Never   Smokeless tobacco: Never  Vaping Use   Vaping status: Never Used  Substance and Sexual Activity   Alcohol use: No   Drug use: Never   Sexual activity: Not on file  Other Topics Concern   Not on file  Social History Narrative   Not on file   Social Determinants of Health   Financial Resource Strain: Not on file  Food Insecurity: No Food Insecurity (07/20/2021)   Hunger Vital Sign    Worried About Running Out of Food in the Last Year: Never true    Ran Out of Food in the Last Year: Never true  Transportation Needs: No Transportation Needs (08/17/2022)   PRAPARE - Administrator, Civil Service (Medical): No    Lack of Transportation (Non-Medical): No  Physical Activity: Not on file  Stress: Not on file  Social Connections: Not on file  Intimate Partner Violence: Not on file    FAMILY HISTORY: Family History  Problem Relation Age of Onset   Leukemia Mother    Hypertension Father    Diabetes Sister    Heart disease Brother    Diabetes Brother    Diabetes Brother     ALLERGIES:  is allergic to nexium [esomeprazole  magnesium].  MEDICATIONS:  Current Outpatient Medications  Medication Sig Dispense Refill   B Complex Vitamins (VITAMIN B-COMPLEX PO) Take by mouth.     calcium carbonate (OSCAL) 1500 (600 Ca) MG TABS tablet Take 500 mg by mouth daily.     Cholecalciferol (VITAMIN D) 2000 UNITS tablet Take 4,000 Units by mouth daily.      Ferrous Sulfate (IRON) 325 (65 Fe) MG TABS Take 325 mg by mouth daily.      fluticasone (FLONASE) 50 MCG/ACT nasal spray Place 2 sprays into both nostrils daily. 16 g 2   losartan (COZAAR) 50 MG tablet TAKE 1 TABLET(50 MG) BY MOUTH DAILY 90 tablet 1   montelukast (SINGULAIR) 10 MG tablet Take 1 tablet (10 mg total) by mouth daily. 90 tablet 2   nadolol (CORGARD) 20 MG tablet TAKE 1 TABLET BY MOUTH DAILY FOR BLOOD PRESSURE OR TREMORS 90 tablet 3   nystatin (MYCOSTATIN) 100000 UNIT/ML suspension 5 ml four times a day, retain in mouth as long as  possible (Swish and Spit).  Use for 48 hours after symptoms resolve. 80 mL 0   olopatadine (PATANOL) 0.1 % ophthalmic solution Place 1 drop into both eyes 2 (two) times daily. 5 mL 1   predniSONE (DELTASONE) 10 MG tablet Take 4 tablets (40 mg total) by mouth daily with breakfast for 7 days, THEN 3 tablets (30 mg total) daily with breakfast for 7 days, THEN 2 tablets (20 mg total) daily with breakfast for 7 days, THEN 1 tablet (10 mg total) daily with breakfast for 14 days. 77 tablet 0   No current facility-administered medications for this visit.    REVIEW OF SYSTEMS:    10 Point review of Systems was done is negative except as noted above.   PHYSICAL EXAMINATION: ECOG PERFORMANCE STATUS: 2 - Symptomatic, <50% confined to bed  . Vitals:   10/14/22 0905  BP: (!) 154/61  Pulse: 81  Resp: 18  Temp: 98.1 F (36.7 C)  SpO2: 100%    Filed Weights   10/14/22 0905  Weight: 150 lb 12.8 oz (68.4 kg)   .Body mass index is 30.46 kg/m.  GENERAL:alert, in no acute distress and comfortable SKIN: no acute rashes, no significant  lesions EYES: conjunctiva are pink and non-injected, sclera anicteric OROPHARYNX: MMM, no exudates, no oropharyngeal erythema or ulceration NECK: supple, no JVD LYMPH:  no palpable lymphadenopathy in the cervical, axillary or inguinal regions LUNGS: clear to auscultation b/l with normal respiratory effort HEART: regular rate & rhythm ABDOMEN:  normoactive bowel sounds , non tender, not distended. Extremity: no pedal edema PSYCH: alert & oriented x 3 with fluent speech NEURO: no focal motor/sensory deficits   LABORATORY DATA:  I have reviewed the data as listed  .    Latest Ref Rng & Units 10/14/2022    8:16 AM 09/12/2022    1:32 PM 08/16/2022    9:44 AM  CBC  WBC 4.0 - 10.5 K/uL 4.0  10.1  8.0   Hemoglobin 12.0 - 15.0 g/dL 40.9  81.1  91.4   Hematocrit 36.0 - 46.0 % 42.4  44.4  45.2   Platelets 150 - 400 K/uL 115  145  135    .    Latest Ref Rng & Units 10/14/2022    8:16 AM 09/12/2022    1:32 PM 08/16/2022    9:44 AM  CMP  Glucose 70 - 99 mg/dL 782  86  84   BUN 8 - 23 mg/dL 19  23  30    Creatinine 0.44 - 1.00 mg/dL 9.56  2.13  0.86   Sodium 135 - 145 mmol/L 137  137  139   Potassium 3.5 - 5.1 mmol/L 4.4  4.8  4.8   Chloride 98 - 111 mmol/L 106  104  103   CO2 22 - 32 mmol/L 23  27  25    Calcium 8.9 - 10.3 mg/dL 9.7  57.8  46.9   Total Protein 6.5 - 8.1 g/dL 6.1  7.1  6.9   Total Bilirubin 0.3 - 1.2 mg/dL 0.7  1.3  1.1   Alkaline Phos 38 - 126 U/L 48  51    AST 15 - 41 U/L 22  29  31    ALT 0 - 44 U/L 24  23  22      PATHOLOGY Surgical Pathology CASE: [id number] PATIENT: Lenzie Spiewak Bone Marrow Report     Clinical History: lymphocytosis, possible lymphoma     DIAGNOSIS:  BONE MARROW, ASPIRATE, CLOT, CORE: -Hypercellular bone marrow with  extensive involvement by a B-cell lymphoproliferative disorder -See comment  PERIPHERAL BLOOD: -Macrocytic anemia -Marked lymphocytosis consistent with lymphoproliferative disorder  COMMENT:  There is prominent  involvement by a B-cell lymphoproliferative process associated with cyclin D1 positivity and partial expression of CD5.  The latter is primarily seen by flow cytometry.  The findings favor mantle cell lymphoma but correlation with cytogenetic and FISH studies is recommended.  MICROSCOPIC DESCRIPTION:  PERIPHERAL BLOOD SMEAR: The red blood cells display prominent anisocytosis with macrocytic and normocytic cells.  There is mild to moderate poikilocytosis with teardrop cells, elliptocytes.  There is moderate polychromasia.  The white blood cells are increased in number with lymphocytosis characterized by predominance of small to medium sized lymphoid cells displaying high nuclear cytoplasmic ratio, round to irregular nuclei, coarse chromatin and small to inconspicuous nucleoli. The platelets are normal in number.     RADIOGRAPHIC STUDIES: I have personally reviewed the radiological images as listed and agreed with the findings in the report. No results found.  ASSESSMENT & PLAN:   82 y.o. woman with:   1.  CD20+ CD5+ve lymphoproliferative disorder presented with hemolytic anemia and splenomegaly in December 2021. Though discussed with patient that FISH was neg and so Mantle cell lymphoma not confirm. Findings more consistent with CLL.  2.  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  PLAN:  -Discussed lab results on 10/14/2022 in detail with patient. CBC showed WBC of 4.0K, hemoglobin normal at 13.9, and platelets low 115K, though not concerning at this time. -no anemia -CMP stable -lymphocytes improved to 1.1K -Discussed results of 08/22/2022 CT chest/abdomen/pelvis scan which showed no enlarged spleen or enlarged lymph nodes -unsure if lung changes are related to ibrutinib. Will order high resolution CT chest scan in 2-3 months to ensure patient is not having any signs of early interstitial lung disease. There may be role to  be evaluated by pulmonology -If CLL does return down the line, may consider Acalabrutinib or other medications in the Ibrutinib category -no signs of infection at this time -Cutaneous allergies may be causing itching at this time -continue anti-itch Sarna cream to manage itchiness -recommend OTC Zyrtec/Claritin for nasal symptoms and itchiness -recommend low allergen shampoo/soap products for skin rash -will hold off on any additional Prednisone at this time -continue to moisturize the skin, wear loose clothes, avoid sun exposure, and stay well hydrated.  -There may be a role for dermatology referral if skin condition worsens, though it is improving at this time. -Continue to take B-complex supplement.  -Continue to follow with PCP regularly  FOLLOW-UP: HRCT CT chest in 7 weeks RTC with Dr Candise Che with labs in 2 months  The total time spent in the appointment was 30 minutes* .  All of the patient's questions were answered with apparent satisfaction. The patient knows to call the clinic with any problems, questions or concerns.   Wyvonnia Lora MD MS AAHIVMS [facility] Hematology/Oncology Physician [facility]  .*Total Encounter Time as defined by the Centers for Medicare and Medicaid Services includes, in addition to the face-to-face time of a patient visit (documented in the note above) non-face-to-face time: obtaining and reviewing outside history, ordering and reviewing medications, tests or procedures, care coordination (communications with other health care professionals or caregivers) and documentation in the medical record.    I,Mitra Faeizi,acting as a Neurosurgeon for Wyvonnia Lora, MD.,have documented all relevant documentation on the behalf of Wyvonnia Lora, MD,as directed by  Wyvonnia Lora, MD while in the presence of Wyvonnia Lora, MD.  .I have reviewed the above documentation for accuracy and completeness, and I agree with the above. Johney Maine MD

## 2022-10-25 ENCOUNTER — Other Ambulatory Visit: Payer: Self-pay | Admitting: Internal Medicine

## 2022-10-25 DIAGNOSIS — I159 Secondary hypertension, unspecified: Secondary | ICD-10-CM

## 2022-10-25 MED ORDER — LOSARTAN POTASSIUM 50 MG PO TABS
ORAL_TABLET | ORAL | 3 refills | Status: DC
Start: 2022-10-25 — End: 2022-10-25

## 2022-10-25 MED ORDER — LOSARTAN POTASSIUM 50 MG PO TABS
ORAL_TABLET | ORAL | 3 refills | Status: DC
Start: 2022-10-25 — End: 2022-10-26

## 2022-10-26 ENCOUNTER — Other Ambulatory Visit: Payer: Self-pay | Admitting: Nurse Practitioner

## 2022-10-26 DIAGNOSIS — I159 Secondary hypertension, unspecified: Secondary | ICD-10-CM

## 2022-11-17 ENCOUNTER — Ambulatory Visit: Payer: Self-pay

## 2022-11-17 NOTE — Patient Instructions (Signed)
Visit Information  Thank you for taking time to visit with me today. Please don't hesitate to contact me if I can be of assistance to you.   Following are the goals we discussed today:   Goals Addressed             This Visit's Progress    Health Maintenance       Patient Goals/Self Care Activities: -Patient/Caregiver will self-administer medications as prescribed as evidenced by self-report/primary caregiver report  -Patient/Caregiver will attend all scheduled provider appointments as evidenced by clinician review of documented attendance to scheduled appointments and patient/caregiver report -Patient/Caregiver will call provider office for new concerns or questions as evidenced by review of documented incoming telephone call notes and patient report  -Calls provider office for new concerns, questions, or BP outside discussed parameters -Checks BP and records as discussed -Follows a low sodium diet/DASH diet   Patient reports doing okay. She reports some itching and skin peeling.  She states that the oncologist stopped her prednisone but itching continues.  Discussed skin hydration.  She thinks it maybe her blood pressure medication.  Discussed possible seeing/contacting PCP to discuss.  She verbalized understanding.  Blood pressure management continues.  Last blood pressure 130/67.  Discussed continuing to monitor blood pressure and limit salt intake.  She verbalized understanding.         Our next appointment is by telephone on 02/16/23 at 1000am  Please call the care guide team at [phone] if you need to cancel or reschedule your appointment.   If you are experiencing a Mental Health or Behavioral Health Crisis or need someone to talk to, please call the Suicide and Crisis Lifeline: 988   The patient verbalized understanding of instructions, educational materials, and care plan provided today and DECLINED offer to receive copy of patient instructions, educational materials,  and care plan.   The patient has been provided with contact information for the care management team and has been advised to call with any health related questions or concerns.   Bary Leriche, RN, MSN [facility] Care Management Care Management Coordinator Direct Line [phone]

## 2022-11-17 NOTE — Patient Outreach (Signed)
  Care Coordination   Follow Up Visit Note   11/17/2022 Name: Jacqueline Hancock MRN: [id number] DOB: November 22, 1940  Jacqueline Hancock is a 83 y.o. year old female who sees Lucky Cowboy, MD for primary care. I spoke with  [name] by phone today.  What matters to the patients health and wellness today?  Skin itching  and peeling.    Goals Addressed             This Visit's Progress    Health Maintenance       Patient Goals/Self Care Activities: -Patient/Caregiver will self-administer medications as prescribed as evidenced by self-report/primary caregiver report  -Patient/Caregiver will attend all scheduled provider appointments as evidenced by clinician review of documented attendance to scheduled appointments and patient/caregiver report -Patient/Caregiver will call provider office for new concerns or questions as evidenced by review of documented incoming telephone call notes and patient report  -Calls provider office for new concerns, questions, or BP outside discussed parameters -Checks BP and records as discussed -Follows a low sodium diet/DASH diet   Patient reports doing okay. She reports some itching and skin peeling.  She states that the oncologist stopped her prednisone but itching continues.  Discussed skin hydration.  She thinks it maybe her blood pressure medication.  Discussed possible seeing/contacting PCP to discuss.  She verbalized understanding.  Blood pressure management continues.  Last blood pressure 130/67.  Discussed continuing to monitor blood pressure and limit salt intake.  She verbalized understanding.         SDOH assessments and interventions completed:  Yes     Care Coordination Interventions:  Yes, provided   Follow up plan: Follow up call scheduled for November    Encounter Outcome:  Pt. Visit Completed   Bary Leriche, RN, MSN [facility] Care Management Care Management Coordinator Direct Line [phone]

## 2022-11-21 ENCOUNTER — Telehealth: Payer: Self-pay | Admitting: Nurse Practitioner

## 2022-11-21 NOTE — Telephone Encounter (Signed)
Spoke with patient, going to make her an appointment to be seen.

## 2022-11-21 NOTE — Telephone Encounter (Signed)
Pt has some questions about medication. Would like to speak with nurse.

## 2022-11-21 NOTE — Telephone Encounter (Signed)
Pt has med questions.  Can you please call

## 2022-11-21 NOTE — Progress Notes (Unsigned)
Assessment and Plan:  There are no diagnoses linked to this encounter.    Further disposition pending results of labs. Discussed med's effects and SE's.   Over 30 minutes of exam, counseling, chart review, and critical decision making was performed.   Future Appointments  Date Time Provider Department Center  11/22/2022 10:00 AM [name], NP [facility] None  12/02/2022  8:30 AM [facility] 1 [facility] [place]  12/16/2022 10:30 AM [facility] None  12/16/2022 11:00 AM Johney Maine, MD [facility] None  02/14/2023 10:00 AM [name], NP [facility] None  02/16/2023 10:00 AM Fleeta Emmer, RN [facility] None    ------------------------------------------------------------------------------------------------------------------   HPI There were no vitals taken for this visit. 82 y.o.female presents for  Past Medical History:  Diagnosis Date   Dyspnea 2021   with exersion    Early cataracts, bilateral    Elevated hemoglobin A1c    Fibroids    GERD (gastroesophageal reflux disease)    Hyperlipidemia    Hypertension    Osteopenia      Allergies  Allergen Reactions   Nexium [Esomeprazole Magnesium] Other (See Comments)    Headache    Current Outpatient Medications on File Prior to Visit  Medication Sig   B Complex Vitamins (VITAMIN B-COMPLEX PO) Take by mouth.   calcium carbonate (OSCAL) 1500 (600 Ca) MG TABS tablet Take 500 mg by mouth daily.   Cholecalciferol (VITAMIN D) 2000 UNITS tablet Take 4,000 Units by mouth daily.    Ferrous Sulfate (IRON) 325 (65 Fe) MG TABS Take 325 mg by mouth daily.    fluticasone (FLONASE) 50 MCG/ACT nasal spray Place 2 sprays into both nostrils daily.   losartan (COZAAR) 50 MG tablet TAKE 1 TABLET(50 MG) BY MOUTH DAILY   montelukast (SINGULAIR) 10 MG tablet Take 1 tablet (10 mg total) by mouth daily.   nadolol (CORGARD) 20 MG tablet TAKE 1 TABLET BY MOUTH DAILY FOR BLOOD PRESSURE OR TREMORS   nystatin (MYCOSTATIN)  100000 UNIT/ML suspension 5 ml four times a day, retain in mouth as long as possible (Swish and Spit).  Use for 48 hours after symptoms resolve.   olopatadine (PATANOL) 0.1 % ophthalmic solution Place 1 drop into both eyes 2 (two) times daily.   [DISCONTINUED] gabapentin (NEURONTIN) 100 MG capsule Take 1 capsule 3 x /day for Neuropathy Pain (Patient not taking: Reported on 01/25/2020)   No current facility-administered medications on file prior to visit.    ROS: all negative except above.   Physical Exam:  There were no vitals taken for this visit.  General Appearance: Well nourished, in no apparent distress. Eyes: PERRLA, EOMs, conjunctiva no swelling or erythema Sinuses: No Frontal/maxillary tenderness ENT/Mouth: Ext aud canals clear, TMs without erythema, bulging. No erythema, swelling, or exudate on post pharynx.  Tonsils not swollen or erythematous. Hearing normal.  Neck: Supple, thyroid normal.  Respiratory: Respiratory effort normal, BS equal bilaterally without rales, rhonchi, wheezing or stridor.  Cardio: RRR with no MRGs. Brisk peripheral pulses without edema.  Abdomen: Soft, + BS.  Non tender, no guarding, rebound, hernias, masses. Lymphatics: Non tender without lymphadenopathy.  Musculoskeletal: Full ROM, 5/5 strength, normal gait.  Skin: Warm, dry without rashes, lesions, ecchymosis.  Neuro: Cranial nerves intact. Normal muscle tone, no cerebellar symptoms. Sensation intact.  Psych: Awake and oriented X 3, normal affect, Insight and Judgment appropriate.     [name], NP 10:05 AM [facility]

## 2022-11-22 ENCOUNTER — Ambulatory Visit (INDEPENDENT_AMBULATORY_CARE_PROVIDER_SITE_OTHER): Payer: Medicare Other | Admitting: Nurse Practitioner

## 2022-11-22 ENCOUNTER — Encounter: Payer: Self-pay | Admitting: Nurse Practitioner

## 2022-11-22 VITALS — BP 114/60 | HR 70 | Temp 97.7°F | Ht 59.0 in | Wt 157.6 lb

## 2022-11-22 DIAGNOSIS — Z888 Allergy status to other drugs, medicaments and biological substances status: Secondary | ICD-10-CM | POA: Diagnosis not present

## 2022-11-22 DIAGNOSIS — I10 Essential (primary) hypertension: Secondary | ICD-10-CM | POA: Diagnosis not present

## 2022-11-22 DIAGNOSIS — L299 Pruritus, unspecified: Secondary | ICD-10-CM | POA: Diagnosis not present

## 2022-11-22 DIAGNOSIS — C9110 Chronic lymphocytic leukemia of B-cell type not having achieved remission: Secondary | ICD-10-CM

## 2022-11-22 NOTE — Patient Instructions (Addendum)
Stop Losartan and check BP daily and keep BP log  Continue Nadolol daily  Start taking Montelukast 10 mg every night  Monitor symptoms and follow up in 1 week- bring BP cuff and BP log to appointment  If BP is greater than 140-/90 consistently call the office  If start having difficulty breathing or difficulty swallowing go to the er.   Hypertension, Adult High blood pressure (hypertension) is when the force of blood pumping through the arteries is too strong. The arteries are the blood vessels that carry blood from the heart throughout the body. Hypertension forces the heart to work harder to pump blood and may cause arteries to become narrow or stiff. Untreated or uncontrolled hypertension can lead to a heart attack, heart failure, a stroke, kidney disease, and other problems. A blood pressure reading consists of a higher number over a lower number. Ideally, your blood pressure should be below 120/80. The first ("top") number is called the systolic pressure. It is a measure of the pressure in your arteries as your heart beats. The second ("bottom") number is called the diastolic pressure. It is a measure of the pressure in your arteries as the heart relaxes. What are the causes? The exact cause of this condition is not known. There are some conditions that result in high blood pressure. What increases the risk? Certain factors may make you more likely to develop high blood pressure. Some of these risk factors are under your control, including: Smoking. Not getting enough exercise or physical activity. Being overweight. Having too much fat, sugar, calories, or salt (sodium) in your diet. Drinking too much alcohol. Other risk factors include: Having a personal history of heart disease, diabetes, high cholesterol, or kidney disease. Stress. Having a family history of high blood pressure and high cholesterol. Having obstructive sleep apnea. Age. The risk increases with age. What are the  signs or symptoms? High blood pressure may not cause symptoms. Very high blood pressure (hypertensive crisis) may cause: Headache. Fast or irregular heartbeats (palpitations). Shortness of breath. Nosebleed. Nausea and vomiting. Vision changes. Severe chest pain, dizziness, and seizures. How is this diagnosed? This condition is diagnosed by measuring your blood pressure while you are seated, with your arm resting on a flat surface, your legs uncrossed, and your feet flat on the floor. The cuff of the blood pressure monitor will be placed directly against the skin of your upper arm at the level of your heart. Blood pressure should be measured at least twice using the same arm. Certain conditions can cause a difference in blood pressure between your right and left arms. If you have a high blood pressure reading during one visit or you have normal blood pressure with other risk factors, you may be asked to: Return on a different day to have your blood pressure checked again. Monitor your blood pressure at home for 1 week or longer. If you are diagnosed with hypertension, you may have other blood or imaging tests to help your health care provider understand your overall risk for other conditions. How is this treated? This condition is treated by making healthy lifestyle changes, such as eating healthy foods, exercising more, and reducing your alcohol intake. You may be referred for counseling on a healthy diet and physical activity. Your health care provider may prescribe medicine if lifestyle changes are not enough to get your blood pressure under control and if: Your systolic blood pressure is above 130. Your diastolic blood pressure is above 80. Your personal target  blood pressure may vary depending on your medical conditions, your age, and other factors. Follow these instructions at home: Eating and drinking  Eat a diet that is high in fiber and potassium, and low in sodium, added sugar, and  fat. An example of this eating plan is called the DASH diet. DASH stands for Dietary Approaches to Stop Hypertension. To eat this way: Eat plenty of fresh fruits and vegetables. Try to fill one half of your plate at each meal with fruits and vegetables. Eat whole grains, such as whole-wheat pasta, brown rice, or whole-grain bread. Fill about one fourth of your plate with whole grains. Eat or drink low-fat dairy products, such as skim milk or low-fat yogurt. Avoid fatty cuts of meat, processed or cured meats, and poultry with skin. Fill about one fourth of your plate with lean proteins, such as fish, chicken without skin, beans, eggs, or tofu. Avoid pre-made and processed foods. These tend to be higher in sodium, added sugar, and fat. Reduce your daily sodium intake. Many people with hypertension should eat less than 1,500 mg of sodium a day. Do not drink alcohol if: Your health care provider tells you not to drink. You are pregnant, may be pregnant, or are planning to become pregnant. If you drink alcohol: Limit how much you have to: 0-1 drink a day for women. 0-2 drinks a day for men. Know how much alcohol is in your drink. In the [place] one drink equals one 12 oz bottle of beer (355 mL), one 5 oz glass of wine (148 mL), or one 1 oz glass of hard liquor (44 mL). Lifestyle  Work with your health care provider to maintain a healthy body weight or to lose weight. Ask what an ideal weight is for you. Get at least 30 minutes of exercise that causes your heart to beat faster (aerobic exercise) most days of the week. Activities may include walking, swimming, or biking. Include exercise to strengthen your muscles (resistance exercise), such as Pilates or lifting weights, as part of your weekly exercise routine. Try to do these types of exercises for 30 minutes at least 3 days a week. Do not use any products that contain nicotine or tobacco. These products include cigarettes, chewing tobacco, and vaping  devices, such as e-cigarettes. If you need help quitting, ask your health care provider. Monitor your blood pressure at home as told by your health care provider. Keep all follow-up visits. This is important. Medicines Take over-the-counter and prescription medicines only as told by your health care provider. Follow directions carefully. Blood pressure medicines must be taken as prescribed. Do not skip doses of blood pressure medicine. Doing this puts you at risk for problems and can make the medicine less effective. Ask your health care provider about side effects or reactions to medicines that you should watch for. Contact a health care provider if you: Think you are having a reaction to a medicine you are taking. Have headaches that keep coming back (recurring). Feel dizzy. Have swelling in your ankles. Have trouble with your vision. Get help right away if you: Develop a severe headache or confusion. Have unusual weakness or numbness. Feel faint. Have severe pain in your chest or abdomen. Vomit repeatedly. Have trouble breathing. These symptoms may be an emergency. Get help right away. Call 911. Do not wait to see if the symptoms will go away. Do not drive yourself to the hospital. Summary Hypertension is when the force of blood pumping through your arteries is  too strong. If this condition is not controlled, it may put you at risk for serious complications. Your personal target blood pressure may vary depending on your medical conditions, your age, and other factors. For most people, a normal blood pressure is less than 120/80. Hypertension is treated with lifestyle changes, medicines, or a combination of both. Lifestyle changes include losing weight, eating a healthy, low-sodium diet, exercising more, and limiting alcohol. This information is not intended to replace advice given to you by your health care provider. Make sure you discuss any questions you have with your health care  provider. Document Revised: 01/26/2021 Document Reviewed: 01/26/2021 Elsevier Patient Education  2024 [employer].

## 2022-11-28 NOTE — Progress Notes (Unsigned)
Assessment and Plan:  There are no diagnoses linked to this encounter.    Further disposition pending results of labs. Discussed med's effects and SE's.   Over 30 minutes of exam, counseling, chart review, and critical decision making was performed.   Future Appointments  Date Time Provider Department Center  11/29/2022 11:30 AM Jacqueline Dick, NP [facility] None  12/02/2022  8:30 AM [facility] 1 [facility] [place]  12/16/2022 10:30 AM [facility] None  12/16/2022 11:00 AM Johney Maine, MD [facility] None  02/14/2023 10:00 AM Jacqueline Dick, NP [facility] None  02/16/2023 10:00 AM Fleeta Emmer, RN [facility] None    ------------------------------------------------------------------------------------------------------------------   HPI There were no vitals taken for this visit. 82 y.o.female presents for  Past Medical History:  Diagnosis Date   Dyspnea 2021   with exersion    Early cataracts, bilateral    Elevated hemoglobin A1c    Fibroids    GERD (gastroesophageal reflux disease)    Hyperlipidemia    Hypertension    Osteopenia      Allergies  Allergen Reactions   Nexium [Esomeprazole Magnesium] Other (See Comments)    Headache    Current Outpatient Medications on File Prior to Visit  Medication Sig   B Complex Vitamins (VITAMIN B-COMPLEX PO) Take by mouth.   calcium carbonate (OSCAL) 1500 (600 Ca) MG TABS tablet Take 500 mg by mouth daily.   Cholecalciferol (VITAMIN D) 2000 UNITS tablet Take 4,000 Units by mouth daily.    Ferrous Sulfate (IRON) 325 (65 Fe) MG TABS Take 325 mg by mouth daily.    fluticasone (FLONASE) 50 MCG/ACT nasal spray Place 2 sprays into both nostrils daily.   montelukast (SINGULAIR) 10 MG tablet Take 1 tablet (10 mg total) by mouth daily.   nadolol (CORGARD) 20 MG tablet TAKE 1 TABLET BY MOUTH DAILY FOR BLOOD PRESSURE OR TREMORS   nystatin (MYCOSTATIN) 100000 UNIT/ML suspension 5 ml four times a day, retain in mouth as  long as possible (Swish and Spit).  Use for 48 hours after symptoms resolve.   olopatadine (PATANOL) 0.1 % ophthalmic solution Place 1 drop into both eyes 2 (two) times daily.   tobramycin-dexamethasone (TOBRADEX) ophthalmic solution 1 drop 4 (four) times daily.   [DISCONTINUED] gabapentin (NEURONTIN) 100 MG capsule Take 1 capsule 3 x /day for Neuropathy Pain (Patient not taking: Reported on 01/25/2020)   No current facility-administered medications on file prior to visit.    ROS: all negative except above.   Physical Exam:  There were no vitals taken for this visit.  General Appearance: Well nourished, in no apparent distress. Eyes: PERRLA, EOMs, conjunctiva no swelling or erythema Sinuses: No Frontal/maxillary tenderness ENT/Mouth: Ext aud canals clear, TMs without erythema, bulging. No erythema, swelling, or exudate on post pharynx.  Tonsils not swollen or erythematous. Hearing normal.  Neck: Supple, thyroid normal.  Respiratory: Respiratory effort normal, BS equal bilaterally without rales, rhonchi, wheezing or stridor.  Cardio: RRR with no MRGs. Brisk peripheral pulses without edema.  Abdomen: Soft, + BS.  Non tender, no guarding, rebound, hernias, masses. Lymphatics: Non tender without lymphadenopathy.  Musculoskeletal: Full ROM, 5/5 strength, normal gait.  Skin: Warm, dry without rashes, lesions, ecchymosis.  Neuro: Cranial nerves intact. Normal muscle tone, no cerebellar symptoms. Sensation intact.  Psych: Awake and oriented X 3, normal affect, Insight and Judgment appropriate.     Jacqueline Dick, NP 1:51 PM [facility]

## 2022-11-29 ENCOUNTER — Ambulatory Visit (INDEPENDENT_AMBULATORY_CARE_PROVIDER_SITE_OTHER): Payer: Medicare Other | Admitting: Nurse Practitioner

## 2022-11-29 ENCOUNTER — Encounter: Payer: Self-pay | Admitting: Nurse Practitioner

## 2022-11-29 VITALS — BP 110/62 | HR 70 | Temp 97.7°F | Ht 59.0 in | Wt 155.8 lb

## 2022-11-29 DIAGNOSIS — I10 Essential (primary) hypertension: Secondary | ICD-10-CM | POA: Diagnosis not present

## 2022-11-29 DIAGNOSIS — T7840XD Allergy, unspecified, subsequent encounter: Secondary | ICD-10-CM | POA: Diagnosis not present

## 2022-11-29 DIAGNOSIS — N183 Chronic kidney disease, stage 3 unspecified: Secondary | ICD-10-CM

## 2022-11-29 MED ORDER — DEXAMETHASONE SODIUM PHOSPHATE 10 MG/ML IJ SOLN
10.0000 mg | Freq: Once | INTRAMUSCULAR | Status: AC
Start: 2022-11-29 — End: 2022-11-29
  Administered 2022-11-29: 10 mg via INTRAMUSCULAR

## 2022-11-29 NOTE — Patient Instructions (Signed)
Dexamethasone 10 mg Injection today  No longer wear current wig  Restart losartan 50 mg 1/2 tab daily  Follow up in 1 month   Contact Dermatitis Dermatitis is redness, soreness, and swelling (inflammation) of the skin. Contact dermatitis is a reaction to certain substances that touch the skin. There are two types of this condition: Irritant contact dermatitis. This is the most common type. It happens when something irritates your skin, such as when your hands get dry from washing them too often with soap. You can get this type of reaction even if you have not been exposed to the irritant before. Allergic contact dermatitis. This type is caused by a substance that you are allergic to, such as poison ivy. It occurs when you have been exposed to the substance (allergen) and form a sensitivity to it. In some cases, the reaction may start soon after your first exposure to the allergen. In other cases, it may not start until you are exposed to the allergen again. It may then occur every time you are exposed to the allergen in the future. What are the causes? Irritant contact dermatitis is often caused by exposure to: Makeup. Soaps, detergents, and bleaches. Acids. Metal salts, such as nickel. Allergic contact dermatitis is often caused by exposure to: Poisonous plants. Chemicals. Jewelry. Latex. Medicines. Preservatives in products, such as clothes. What increases the risk? You are more likely to get this condition if you have: A job that exposes you to irritants or allergens. Certain medical conditions. These include asthma and eczema. What are the signs or symptoms? Symptoms of this condition may occur in any place on your body that has been touched by the irritant. Symptoms include: Dryness, flaking, or cracking. Redness. Itching. Pain or a burning feeling. Blisters. Drainage of small amounts of blood or clear fluid from skin cracks. With allergic contact dermatitis, there may  also be swelling in areas such as the eyelids, mouth, or genitals. How is this diagnosed? This condition is diagnosed with a medical history and physical exam. A patch skin test may be done to help figure out the cause. If the condition is related to your job, you may need to see an expert in health problems in the workplace (occupational medicine specialist). How is this treated? This condition is treated by staying away from the cause of the reaction and protecting your skin from further contact. Treatment may also include: Steroid creams or ointments. Steroid medicines may need be taken by mouth (orally) in more severe cases. Antibiotics or medicines applied to the skin to kill bacteria (antibacterial ointments). These may be needed if a skin infection is present. Antihistamines. These may be taken orally or put on as a lotion to ease itching. A bandage (dressing). Follow these instructions at home: Skin care Moisturize your skin as needed. Put cool, wet cloths (cool compresses) on the affected areas. Try applying baking soda paste to your skin. Stir water into baking soda until it has the consistency of a paste. Do not scratch your skin. Avoid friction to the affected area. Avoid the use of soaps, perfumes, and dyes. Check the affected areas every day for signs of infection. Check for: More redness, swelling, or pain. More fluid or blood. Warmth. Pus or a bad smell. Medicines Take or apply over-the-counter and prescription medicines only as told by your health care provider. If you were prescribed antibiotics, take or apply them as told by your health care provider. Do not stop using the antibiotic even if  you start to feel better. Bathing Try taking a bath with: Epsom salts. Follow the instructions on the packaging. You can get these at your local pharmacy or grocery store. Baking soda. Pour a small amount into the bath as told by your health care provider. Colloidal oatmeal.  Follow the instructions on the packaging. You can get this at your local pharmacy or grocery store. Bathe less often. This may mean bathing every other day. Bathe in lukewarm water. Avoid using hot water. Bandage care If you were given a dressing, change it as told by your health care provider. Wash your hands with soap and water for at least 20 seconds before and after you change your dressing. If soap and water are not available, use hand sanitizer. General instructions Avoid the substance that caused your reaction. If you do not know what caused it, keep a journal to try to track what caused it. Write down: What you eat and drink. What cosmetics you use. What you wear in the affected area. This includes jewelry. Contact a health care provider if: Your condition does not get better with treatment. Your condition gets worse. You have any signs of infection. You have a fever. You have new symptoms. Your bone or joint under the affected area becomes painful after the skin has healed. Get help right away if: You notice red streaks coming from the affected area. The affected area turns darker. You have trouble breathing. This information is not intended to replace advice given to you by your health care provider. Make sure you discuss any questions you have with your health care provider. Document Revised: 09/24/2021 Document Reviewed: 09/24/2021 Elsevier Patient Education  2024 [employer].

## 2022-12-02 ENCOUNTER — Ambulatory Visit (HOSPITAL_COMMUNITY)
Admission: RE | Admit: 2022-12-02 | Discharge: 2022-12-02 | Disposition: A | Discharge status: Home / Self Care | Payer: Medicare Other | Source: Ambulatory Visit | Attending: Hematology | Admitting: Hematology

## 2022-12-02 DIAGNOSIS — R918 Other nonspecific abnormal finding of lung field: Secondary | ICD-10-CM | POA: Diagnosis not present

## 2022-12-02 DIAGNOSIS — J849 Interstitial pulmonary disease, unspecified: Secondary | ICD-10-CM | POA: Diagnosis not present

## 2022-12-08 ENCOUNTER — Telehealth: Payer: Self-pay

## 2022-12-08 NOTE — Patient Outreach (Signed)
  Care Coordination   Follow Up Visit Note   12/08/2022 Name: Jacqueline Hancock MRN: [id number] DOB: 06/06/1940  Jacqueline Hancock is a 82 y.o. year old female who sees Lucky Cowboy, MD for primary care. I spoke with  [name] by phone today.  What matters to the patients health and wellness today?  Scalp getting better    Goals Addressed             This Visit's Progress    Health Maintenance       Patient Goals/Self Care Activities: -Patient/Caregiver will self-administer medications as prescribed as evidenced by self-report/primary caregiver report  -Patient/Caregiver will attend all scheduled provider appointments as evidenced by clinician review of documented attendance to scheduled appointments and patient/caregiver report -Patient/Caregiver will call provider office for new concerns or questions as evidenced by review of documented incoming telephone call notes and patient report  -Calls provider office for new concerns, questions, or BP outside discussed parameters -Checks BP and records as discussed -Follows a low sodium diet/DASH diet   Patient reports doing okay. She still reports some itching and skin peeling to forehead and scalp.  She states she saw PCP and steroid shot given and is some better.  MD also suspects wig as cause and advised not to wear and get another one.  RN CM gave rationale as wigs made and dyed with different chemicals and that she is probably allergic to something in the wig.  She is going to get a new wig but has not yet.  Advised her when she does to limit usage, She verbalized understanding.  Blood pressure management continues.          SDOH assessments and interventions completed:  Yes     Care Coordination Interventions:  Yes, provided   Follow up plan: Follow up call scheduled for November    Encounter Outcome:  Patient Visit Completed   Bary Leriche, RN, MSN [place]  [facility], [facility] Management  Community Coordinator Direct Dial: [phone]  Fax: [phone] Website: [email or web address]

## 2022-12-08 NOTE — Patient Instructions (Signed)
Visit Information  Thank you for taking time to visit with me today. Please don't hesitate to contact me if I can be of assistance to you.   Following are the goals we discussed today:   Goals Addressed             This Visit's Progress    Health Maintenance       Patient Goals/Self Care Activities: -Patient/Caregiver will self-administer medications as prescribed as evidenced by self-report/primary caregiver report  -Patient/Caregiver will attend all scheduled provider appointments as evidenced by clinician review of documented attendance to scheduled appointments and patient/caregiver report -Patient/Caregiver will call provider office for new concerns or questions as evidenced by review of documented incoming telephone call notes and patient report  -Calls provider office for new concerns, questions, or BP outside discussed parameters -Checks BP and records as discussed -Follows a low sodium diet/DASH diet   Patient reports doing okay. She still reports some itching and skin peeling to forehead and scalp.  She states she saw PCP and steroid shot given and is some better.  MD also suspects wig as cause and advised not to wear and get another one.  RN CM gave rationale as wigs made and dyed with different chemicals and that she is probably allergic to something in the wig.  She is going to get a new wig but has not yet.  Advised her when she does to limit usage, She verbalized understanding.  Blood pressure management continues.          Our next appointment is by telephone on 02/16/23 at 1000 am  Please call the care guide team at [phone] if you need to cancel or reschedule your appointment.   If you are experiencing a Mental Health or Behavioral Health Crisis or need someone to talk to, please call the Suicide and Crisis Lifeline: 988   The patient verbalized understanding of instructions, educational materials, and care plan provided today and DECLINED offer to receive copy  of patient instructions, educational materials, and care plan.   The patient has been provided with contact information for the care management team and has been advised to call with any health related questions or concerns.   Bary Leriche, RN, MSN [facility], [facility] Management Community Coordinator Direct Dial: [phone]  Fax: [phone] Website: [email or web address]

## 2022-12-15 ENCOUNTER — Other Ambulatory Visit: Payer: Self-pay

## 2022-12-15 DIAGNOSIS — C9110 Chronic lymphocytic leukemia of B-cell type not having achieved remission: Secondary | ICD-10-CM

## 2022-12-16 ENCOUNTER — Inpatient Hospital Stay: Payer: Medicare Other | Attending: Hematology

## 2022-12-16 ENCOUNTER — Inpatient Hospital Stay (HOSPITAL_BASED_OUTPATIENT_CLINIC_OR_DEPARTMENT_OTHER): Payer: Medicare Other | Admitting: Hematology

## 2022-12-16 VITALS — BP 154/65 | HR 69 | Temp 98.7°F | Resp 16 | Ht 59.0 in | Wt 160.0 lb

## 2022-12-16 DIAGNOSIS — I700 Atherosclerosis of aorta: Secondary | ICD-10-CM | POA: Insufficient documentation

## 2022-12-16 DIAGNOSIS — I10 Essential (primary) hypertension: Secondary | ICD-10-CM | POA: Insufficient documentation

## 2022-12-16 DIAGNOSIS — K219 Gastro-esophageal reflux disease without esophagitis: Secondary | ICD-10-CM | POA: Insufficient documentation

## 2022-12-16 DIAGNOSIS — R918 Other nonspecific abnormal finding of lung field: Secondary | ICD-10-CM | POA: Diagnosis not present

## 2022-12-16 DIAGNOSIS — C9110 Chronic lymphocytic leukemia of B-cell type not having achieved remission: Secondary | ICD-10-CM

## 2022-12-16 DIAGNOSIS — R1900 Intra-abdominal and pelvic swelling, mass and lump, unspecified site: Secondary | ICD-10-CM | POA: Insufficient documentation

## 2022-12-16 DIAGNOSIS — M8580 Other specified disorders of bone density and structure, unspecified site: Secondary | ICD-10-CM | POA: Diagnosis not present

## 2022-12-16 DIAGNOSIS — Z8572 Personal history of non-Hodgkin lymphomas: Secondary | ICD-10-CM | POA: Diagnosis not present

## 2022-12-16 DIAGNOSIS — D591 Autoimmune hemolytic anemia, unspecified: Secondary | ICD-10-CM | POA: Insufficient documentation

## 2022-12-16 DIAGNOSIS — E785 Hyperlipidemia, unspecified: Secondary | ICD-10-CM | POA: Diagnosis not present

## 2022-12-16 DIAGNOSIS — R161 Splenomegaly, not elsewhere classified: Secondary | ICD-10-CM | POA: Insufficient documentation

## 2022-12-16 DIAGNOSIS — J849 Interstitial pulmonary disease, unspecified: Secondary | ICD-10-CM | POA: Diagnosis not present

## 2022-12-16 DIAGNOSIS — D539 Nutritional anemia, unspecified: Secondary | ICD-10-CM | POA: Insufficient documentation

## 2022-12-16 DIAGNOSIS — Z79899 Other long term (current) drug therapy: Secondary | ICD-10-CM | POA: Diagnosis not present

## 2022-12-16 DIAGNOSIS — R0602 Shortness of breath: Secondary | ICD-10-CM | POA: Diagnosis not present

## 2022-12-16 DIAGNOSIS — N200 Calculus of kidney: Secondary | ICD-10-CM | POA: Diagnosis not present

## 2022-12-16 LAB — CBC WITH DIFFERENTIAL (CANCER CENTER ONLY)
Abs Immature Granulocytes: 0.01 10*3/uL (ref 0.00–0.07)
Basophils Absolute: 0 10*3/uL (ref 0.0–0.1)
Basophils Relative: 1 %
Eosinophils Absolute: 0.3 10*3/uL (ref 0.0–0.5)
Eosinophils Relative: 5 %
HCT: 40.5 % (ref 36.0–46.0)
Hemoglobin: 13.4 g/dL (ref 12.0–15.0)
Immature Granulocytes: 0 %
Lymphocytes Relative: 33 %
Lymphs Abs: 1.9 10*3/uL (ref 0.7–4.0)
MCH: 31.6 pg (ref 26.0–34.0)
MCHC: 33.1 g/dL (ref 30.0–36.0)
MCV: 95.5 fL (ref 80.0–100.0)
Monocytes Absolute: 0.7 10*3/uL (ref 0.1–1.0)
Monocytes Relative: 12 %
Neutro Abs: 2.9 10*3/uL (ref 1.7–7.7)
Neutrophils Relative %: 49 %
Platelet Count: 139 10*3/uL — ABNORMAL LOW (ref 150–400)
RBC: 4.24 MIL/uL (ref 3.87–5.11)
RDW: 13.2 % (ref 11.5–15.5)
WBC Count: 5.9 10*3/uL (ref 4.0–10.5)
nRBC: 0 % (ref 0.0–0.2)

## 2022-12-16 LAB — CMP (CANCER CENTER ONLY)
ALT: 21 U/L (ref 0–44)
AST: 21 U/L (ref 15–41)
Albumin: 4.3 g/dL (ref 3.5–5.0)
Alkaline Phosphatase: 56 U/L (ref 38–126)
Anion gap: 6 (ref 5–15)
BUN: 23 mg/dL (ref 8–23)
CO2: 27 mmol/L (ref 22–32)
Calcium: 10.1 mg/dL (ref 8.9–10.3)
Chloride: 110 mmol/L (ref 98–111)
Creatinine: 1.34 mg/dL — ABNORMAL HIGH (ref 0.44–1.00)
GFR, Estimated: 40 mL/min — ABNORMAL LOW (ref >60)
Glucose, Bld: 86 mg/dL (ref 70–99)
Potassium: 4.5 mmol/L (ref 3.5–5.1)
Sodium: 143 mmol/L (ref 135–145)
Total Bilirubin: 1.1 mg/dL (ref 0.3–1.2)
Total Protein: 6.6 g/dL (ref 6.5–8.1)

## 2022-12-16 NOTE — Progress Notes (Signed)
HEMATOLOGY/ONCOLOGY CLINIC NOTE  Date of Service: 12/16/22  Patient Care Team: Lucky Cowboy, MD as PCP - General (Internal Medicine) Flo Shanks, MD as Consulting Physician (Otolaryngology) Sharrell Ku, MD as Consulting Physician (Gastroenterology) Pennie Rushing Maris Berger, MD (Inactive) as Consulting Physician (Obstetrics and Gynecology) Jethro Bolus, MD as Consulting Physician (Ophthalmology) Charlett Nose, [facility] (Inactive) as Pharmacist (Pharmacist) Fleeta Emmer, RN as [facility] Care Management  CHIEF COMPLAINTS/PURPOSE OF CONSULTATION:  F/u for CLL/SLL  Prior Therapy:  She is status post bone marrow biopsy completed on March 05, 2020.    Prednisone 60 mg daily in the January 2022 to treat her hemolytic anemia.  This was tapered off after complete hematological response.  HISTORY OF PRESENTING ILLNESS:   Jacqueline Hancock is a wonderful 82 y.o. female who is here for continued evaluation and management of NHL, unspecified body region. Patient was following up with Dr. Clelia Croft.   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 BM Bx Cyclin D1 IHC +ve byt FISH neg . She has presented with splenomegaly, anemia, and lymphocytosis. 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   Patient's current treatment is Imbruvica 280 mg. She was started on Imbruvica 560 mg on January 2022, which was then reduced to 280 mg in July 2023.   Patient was last seen by Dr. Clelia Croft on 02/10/2022 and she was doing well overall.   Patient reports she has been doing well overall since our last visit. She does complains of itchy skin rashes, mostly around the face and head, for around 2-3 weeks ago. Patient notes that she was started couple of new medications from other physicians, but she is unsure of the names. Patient notes that she was started on oxycodone around 3-4 weeks ago. She discontinued oxycodone.    She notes that there is redness around her face, which is not new.   Patient is currently taking Imbruvica 280 mg as prescribed and has been tolerating it well without any severe toxicities.   She is currently taking Iron supplement, calcium supplement, and Vitamin-D supplement.   She denies fever, chills, night sweats, unexpected weight loss, back pain, abdominal pain, chest pain, or leg swelling. Patient does complain of occasional abdominal pain.   Patient notes she had a surgery near her right neck/ear area to remove an enlarged lymph node couple years ago. She complains of mild occasional pain near the site.  INTERVAL HISTORY:  Jacqueline Hancock is a wonderful 82 y.o. 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 She was last seen by me on 10/14/2022 and complained of mild itchiness with previous skin rashes, persistent pruritus in her head/back, and nasal allergies with coughing and rhinorrhea.   Patient has been off of ibruitinub for about 4 months. She reports that her itchiness has improved, though she rates it 7/10 in severity at this time. Patient reports redness surrounding her mouth but denies any rash. She does use sarna cream to manage symptoms and denies using any antihistamines.   Patient reports that her usual weight is 140 pounds. Her current weight is 160 pounds.   She complains of abdominal swelling. Patient has normal bowel habits and denies any constipation.  She denies any abdominal pain. She denies any fever, chills, night sweats, or leg swelling. Patient reports a mild cough.   MEDICAL HISTORY:  Past Medical History:  Diagnosis Date   Dyspnea 2021   with  exersion    Early cataracts, bilateral    Elevated hemoglobin A1c    Fibroids    GERD (gastroesophageal reflux disease)    Hyperlipidemia    Hypertension    Osteopenia     SURGICAL HISTORY: Past Surgical History:  Procedure Laterality Date   ABDOMINAL HYSTERECTOMY     APPENDECTOMY      CATARACT EXTRACTION, BILATERAL Bilateral 09/2017   Dr. 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   TONSILLECTOMY AND ADENOIDECTOMY      SOCIAL HISTORY: Social History   Socioeconomic History   Marital status: Widowed    Spouse name: Not on file   Number of children: Not on file   Years of education: Not on file   Highest education level: Not on file  Occupational History   Not on file  Tobacco Use   Smoking status: Never   Smokeless tobacco: Never  Vaping Use   Vaping status: Never Used  Substance and Sexual Activity   Alcohol use: No   Drug use: Never   Sexual activity: Not on file  Other Topics Concern   Not on file  Social History Narrative   Not on file   Social Determinants of Health   Financial Resource Strain: Not on file  Food Insecurity: No Food Insecurity (07/20/2021)   Hunger Vital Sign    Worried About Running Out of Food in the Last Year: Never true    Ran Out of Food in the Last Year: Never true  Transportation Needs: No Transportation Needs (08/17/2022)   PRAPARE - Administrator, Civil Service (Medical): No    Lack of Transportation (Non-Medical): No  Physical Activity: Not on file  Stress: Not on file  Social Connections: Not on file  Intimate Partner Violence: Not on file    FAMILY HISTORY: Family History  Problem Relation Age of Onset   Leukemia Mother    Hypertension Father    Diabetes Sister    Heart disease Brother    Diabetes Brother    Diabetes Brother     ALLERGIES:  is allergic to nexium [esomeprazole magnesium].  MEDICATIONS:  Current Outpatient Medications  Medication Sig Dispense Refill   B Complex Vitamins (VITAMIN B-COMPLEX PO) Take by mouth.     calcium carbonate (OSCAL) 1500 (600 Ca) MG TABS tablet Take 500 mg by mouth daily.     Cholecalciferol (VITAMIN D) 2000 UNITS tablet Take 4,000 Units by mouth daily.      Ferrous Sulfate (IRON) 325 (65 Fe) MG TABS Take 325 mg by mouth daily.       fluticasone (FLONASE) 50 MCG/ACT nasal spray Place 2 sprays into both nostrils daily. 16 g 2   montelukast (SINGULAIR) 10 MG tablet Take 1 tablet (10 mg total) by mouth daily. 90 tablet 2   nadolol (CORGARD) 20 MG tablet TAKE 1 TABLET BY MOUTH DAILY FOR BLOOD PRESSURE OR TREMORS 90 tablet 3   nystatin (MYCOSTATIN) 100000 UNIT/ML suspension 5 ml four times a day, retain in mouth as long as possible (Swish and Spit).  Use for 48 hours after symptoms resolve. 80 mL 0   olopatadine (PATANOL) 0.1 % ophthalmic solution Place 1 drop into both eyes 2 (two) times daily. 5 mL 1   tobramycin-dexamethasone (TOBRADEX) ophthalmic solution 1 drop 4 (four) times daily.     No current facility-administered medications for this visit.    REVIEW OF SYSTEMS:    10 Point review of Systems was  done is negative except as noted above.   PHYSICAL EXAMINATION: ECOG PERFORMANCE STATUS: 2 - Symptomatic, <50% confined to bed  . Vitals:   12/16/22 1052  BP: (!) 154/65  Pulse: 69  Resp: 16  Temp: 98.7 F (37.1 C)  SpO2: 100%   Filed Weights   12/16/22 1052  Weight: 160 lb (72.6 kg)  .Body mass index is 32.32 kg/m.  GENERAL:alert, in no acute distress and comfortable SKIN: no acute rashes, no significant lesions EYES: conjunctiva are pink and non-injected, sclera anicteric OROPHARYNX: MMM, no exudates, no oropharyngeal erythema or ulceration NECK: supple, no JVD LYMPH:  no palpable lymphadenopathy in the cervical, axillary or inguinal regions LUNGS: clear to auscultation b/l with normal respiratory effort HEART: regular rate & rhythm ABDOMEN:  normoactive bowel sounds , non tender, not distended. Extremity: no pedal edema PSYCH: alert & oriented x 3 with fluent speech NEURO: no focal motor/sensory deficits   LABORATORY DATA:  I have reviewed the data as listed  .    Latest Ref Rng & Units 12/16/2022   10:25 AM 10/14/2022    8:16 AM 09/12/2022    1:32 PM  CBC  WBC 4.0 - 10.5 K/uL 5.9  4.0  10.1    Hemoglobin 12.0 - 15.0 g/dL 16.1  09.6  04.5   Hematocrit 36.0 - 46.0 % 40.5  42.4  44.4   Platelets 150 - 400 K/uL 139  115  145    .    Latest Ref Rng & Units 12/16/2022   10:25 AM 10/14/2022    8:16 AM 09/12/2022    1:32 PM  CMP  Glucose 70 - 99 mg/dL 86  409  86   BUN 8 - 23 mg/dL 23  19  23    Creatinine 0.44 - 1.00 mg/dL 8.11  9.14  7.82   Sodium 135 - 145 mmol/L 143  137  137   Potassium 3.5 - 5.1 mmol/L 4.5  4.4  4.8   Chloride 98 - 111 mmol/L 110  106  104   CO2 22 - 32 mmol/L 27  23  27    Calcium 8.9 - 10.3 mg/dL 95.6  9.7  21.3   Total Protein 6.5 - 8.1 g/dL 6.6  6.1  7.1   Total Bilirubin 0.3 - 1.2 mg/dL 1.1  0.7  1.3   Alkaline Phos 38 - 126 U/L 56  48  51   AST 15 - 41 U/L 21  22  29    ALT 0 - 44 U/L 21  24  23     .No results found for: "LDH"   PATHOLOGY Surgical Pathology CASE: [id number] PATIENT: Rowena Milliman Bone Marrow Report     Clinical History: lymphocytosis, possible lymphoma     DIAGNOSIS:  BONE MARROW, ASPIRATE, CLOT, CORE: -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  The latter is primarily seen by flow cytometry.  The findings favor mantle cell lymphoma but correlation with cytogenetic and FISH studies is recommended.  MICROSCOPIC DESCRIPTION:  PERIPHERAL BLOOD SMEAR: The red blood cells display prominent anisocytosis with macrocytic and normocytic cells.  There is mild to moderate poikilocytosis with teardrop cells, elliptocytes.  There is moderate polychromasia.  The white blood cells are increased in number with lymphocytosis characterized by predominance of small to  medium sized lymphoid cells displaying high  nuclear cytoplasmic ratio, round to irregular nuclei, coarse chromatin and small to inconspicuous nucleoli. The platelets are normal in number.     RADIOGRAPHIC STUDIES: I have personally reviewed the radiological images as listed and agreed with the findings in the report. CT Chest High Resolution  Result Date: 12/11/2022 CLINICAL DATA:  82 year old female with history of CLL. Evaluate for interstitial lung disease. * Tracking Code: BO * EXAM: CT CHEST WITHOUT CONTRAST TECHNIQUE: Multidetector CT imaging of the chest was performed following the standard protocol without intravenous contrast. High resolution imaging of the lungs, as well as inspiratory and expiratory imaging, was performed. RADIATION DOSE REDUCTION: This exam was performed according to the departmental dose-optimization program which includes automated exposure control, adjustment of the mA and/or kV according to patient size and/or use of iterative reconstruction technique. COMPARISON:  No priors. FINDINGS: Cardiovascular: Heart size is normal. There is no significant pericardial fluid, thickening or pericardial calcification. There is aortic atherosclerosis, as well as atherosclerosis of the great vessels of the mediastinum and the coronary arteries, including calcified atherosclerotic plaque in the left circumflex coronary artery. Mediastinum/Nodes: No pathologically enlarged mediastinal or hilar lymph nodes. Please note that accurate exclusion of hilar adenopathy is limited on noncontrast CT scans. Small hiatal hernia. No axillary lymphadenopathy. Lungs/Pleura: High-resolution images demonstrate scattered areas of ground-glass attenuation, septal thickening, thickening of the peribronchovascular interstitium and some mild subpleural reticulation, predominantly in the mid to lower lungs bilaterally. Minimal traction bronchiectasis is also noted. No frank honeycombing. Findings appear only mildly progressive compared to prior examination  from 2021. Inspiratory and expiratory imaging is limited by patient respiratory motion, essentially nondiagnostic. No confluent consolidative airspace disease. No pleural effusions. No definite suspicious appearing pulmonary nodules or masses are noted. Upper Abdomen: 5 mm nonobstructive calculus in the left renal collecting system. Aortic atherosclerosis. Musculoskeletal: There are no aggressive appearing lytic or blastic lesions noted in the visualized portions of the skeleton. IMPRESSION: 1. 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 Repeat high-resolution chest CT should be considered in 12 months to assess for temporal changes in the appearance of the lung parenchyma. 2. Aortic atherosclerosis, in addition to left circumflex coronary artery disease. 3. 5 mm nonobstructive calculus in the left renal collecting system. Aortic Atherosclerosis (ICD10-I70.0). Electronically Signed   By: Trudie Ritthaler M.D.   On: 12/11/2022 11:50    ASSESSMENT & PLAN:   82 y.o. woman with:   1.  CD20+ CD5+ve lymphoproliferative disorder presented with hemolytic anemia and splenomegaly in December 2021. Though discussed with patient that FISH was neg and so Mantle cell lymphoma not confirm. Findings more consistent with CLL.  2.  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  PLAN:  -no enlarged lymph nodes and normal spleen from CT chest/abdomen scan in May -CT chest scan on 8/80/2024 showed progress interstitial lung disease -Discussed lab results on 12/16/22 in detail with patient. CBC normal, showed WBC of 5.9K, hemoglobin of 13.4, and platelets improved to 139K. Platelets were previously 115k on 10/04/2022.  -no major signs of red cell breaskage or anemia at this time -patient's itching is continuing to improve at this time  -Blood counts are  stable -CT scan does not show signs of enlarged lymph nodes. No progression of CLL there, but does show some intersitual changes concerning for intersistiual lung disease.  -Blood counts are  stable -continue to hold off ibrutinitb and continue to monitor -no signs of infection at this time -recommend Zyrtec or Claritin OTC once daily for a few weeks to reduce itching/inflammation -discussed option to be seen by dermatology or PCP for further evaluation of itchiness/inflammation -no evidence of CLL/SLL progression at this time. -will continue to hold off on Ibrutinib and continue to monitor -will continue to monitor with labs in 2-3 months -advised patient to inform us if she develops any worsening cough or fever -will refer to pulmonology to evaluate the cause of interstitial changes ?ILD  FOLLOW-UP: RTC with Dr Candise Che with labs in 3 months Referral to pulmonology for evaluation of interstitial lung disease  The total time spent in the appointment was 30 minutes* .  All of the patient's questions were answered with apparent satisfaction. The patient knows to call the clinic with any problems, questions or concerns.   Wyvonnia Lora MD MS AAHIVMS [facility] Hematology/Oncology Physician [facility]  .*Total Encounter Time as defined by the Centers for Medicare and Medicaid Services includes, in addition to the face-to-face time of a patient visit (documented in the note above) non-face-to-face time: obtaining and reviewing outside history, ordering and reviewing medications, tests or procedures, care coordination (communications with other health care professionals or caregivers) and documentation in the medical record.    I,Mitra Faeizi,acting as a Neurosurgeon for Wyvonnia Lora, MD.,have documented all relevant documentation on the behalf of Wyvonnia Lora, MD,as directed by  Wyvonnia Lora, MD while in the presence of Wyvonnia Lora, MD.  .I have reviewed the above documentation for accuracy  and completeness, and I agree with the above. Johney Maine MD

## 2022-12-26 NOTE — Progress Notes (Unsigned)
Assessment and Plan:  Jacqueline Hancock was seen today for follow-up.  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 Call if greater than 130/80 or less than 90/50 - Follow up in 1 month   Allergic reaction, subsequent encounter No longer wear current wig- timing of new wig coincides with start if redness and itching  of scalp and face Given dexamethasone 10 mg  injection today, continue claritin daily She plans to get new wig, advised to wear as little as possible to prevent further skin breakouts -     dexamethasone (DECADRON) injection 10 mg       Further disposition pending results of labs. Discussed med's effects and SE's.   Over 30 minutes of exam, counseling, chart review, and critical decision making was performed.   Future Appointments  Date Time Provider Department Center  12/27/2022 10:30 AM [name], NP [facility] None  02/14/2023 10:00 AM [name], NP [facility] None  02/16/2023 10:00 AM Fleeta Emmer, RN [facility] None    ------------------------------------------------------------------------------------------------------------------   HPI There were no vitals taken for this visit.  82 y.o.female presents for reevaluation of BP- last visit Losartan was stopped to determine if this was cause of red face and peeling skin and was to continue Nadolol. BP at home has been running 120-130/60's. Restart Losartan 25 mg every day for kidney protection and continue Nadolol 20 mg every day for tremor. Calibrated BP cuff today with ours and was accurate. BP Readings from Last 3 Encounters:  12/16/22 (!) 154/65  11/29/22 110/62  11/22/22  114/60   She has had some improvement of redness of face but still has some redness of chest and scalp is peeling and itchy. Used steroids previously and helped somewhat but returned as soon as it was stopped. She has worn her current wig approximately 4 months ago. Previous wigs did not have any issues. She takes Claritin daily  Lab Results  Component Value Date   EGFR 33 (L) 08/16/2022     Past Medical History:  Diagnosis Date   Dyspnea 2021   with exersion    Early cataracts, bilateral    Elevated hemoglobin A1c    Fibroids    GERD (gastroesophageal reflux disease)    Hyperlipidemia    Hypertension    Osteopenia      Allergies  Allergen Reactions   Nexium [Esomeprazole Magnesium] Other (See Comments)    Headache    Current Outpatient Medications on File Prior to Visit  Medication Sig   B Complex Vitamins (VITAMIN B-COMPLEX PO) Take by mouth.   calcium carbonate (OSCAL) 1500 (600 Ca) MG TABS tablet Take 500 mg by mouth daily.   Cholecalciferol (VITAMIN D) 2000 UNITS tablet Take 4,000 Units by mouth daily.    Ferrous Sulfate (IRON) 325 (65 Fe) MG TABS Take 325 mg by mouth daily.    fluticasone (FLONASE) 50 MCG/ACT nasal spray Place 2 sprays into both nostrils daily.   montelukast (SINGULAIR) 10 MG tablet Take 1 tablet (10 mg total) by mouth daily.   nadolol (CORGARD) 20 MG tablet TAKE 1 TABLET BY MOUTH DAILY FOR BLOOD PRESSURE OR TREMORS   nystatin (MYCOSTATIN) 100000 UNIT/ML suspension  5 ml four times a day, retain in mouth as long as possible (Swish and Spit).  Use for 48 hours after symptoms resolve.   olopatadine (PATANOL) 0.1 % ophthalmic solution Place 1 drop into both eyes 2 (two) times daily.   tobramycin-dexamethasone (TOBRADEX) ophthalmic solution 1 drop 4 (four) times daily.   [DISCONTINUED] gabapentin (NEURONTIN) 100 MG capsule Take 1 capsule 3 x /day for Neuropathy Pain (Patient not taking: Reported on 01/25/2020)   No current facility-administered medications  on file prior to visit.    ROS: all negative except above.   Physical Exam:  There were no vitals taken for this visit.  General Appearance: Well nourished, in no apparent distress. Eyes: PERRLA, EOMs, conjunctiva no swelling or erythema Respiratory: Respiratory effort normal, BS equal bilaterally without rales, rhonchi, wheezing or stridor.  Cardio: RRR with no MRGs. Brisk peripheral pulses without edema.  Abdomen: Soft, + BS.  Non tender, no guarding, rebound, hernias, masses. Lymphatics: Non tender without lymphadenopathy.  Musculoskeletal: Full ROM, 5/5 strength, normal gait.  Skin: Warm, dry, redness remain of face and chest. Dry flaking scalp with redness and 1 open area from itching Neuro: Cranial nerves intact. Normal muscle tone, no cerebellar symptoms. Sensation intact.  Psych: Awake and oriented X 3, normal affect, Insight and Judgment appropriate.     [name], NP 12:44 PM [facility]

## 2022-12-27 ENCOUNTER — Encounter: Payer: Self-pay | Admitting: Nurse Practitioner

## 2022-12-27 ENCOUNTER — Ambulatory Visit (INDEPENDENT_AMBULATORY_CARE_PROVIDER_SITE_OTHER): Payer: Medicare Other | Admitting: Nurse Practitioner

## 2022-12-27 VITALS — BP 138/68 | HR 76 | Temp 97.9°F | Ht 59.0 in | Wt 157.8 lb

## 2022-12-27 DIAGNOSIS — R234 Changes in skin texture: Secondary | ICD-10-CM | POA: Diagnosis not present

## 2022-12-27 DIAGNOSIS — Z23 Encounter for immunization: Secondary | ICD-10-CM | POA: Diagnosis not present

## 2022-12-27 DIAGNOSIS — L299 Pruritus, unspecified: Secondary | ICD-10-CM

## 2022-12-27 DIAGNOSIS — N183 Chronic kidney disease, stage 3 unspecified: Secondary | ICD-10-CM | POA: Diagnosis not present

## 2022-12-27 DIAGNOSIS — I10 Essential (primary) hypertension: Secondary | ICD-10-CM

## 2022-12-27 DIAGNOSIS — R232 Flushing: Secondary | ICD-10-CM

## 2022-12-27 MED ORDER — LOSARTAN POTASSIUM 25 MG PO TABS
25.0000 mg | ORAL_TABLET | Freq: Every day | ORAL | Status: DC
Start: 2022-12-27 — End: 2023-06-14

## 2022-12-27 NOTE — Patient Instructions (Signed)
Referral to dermatology If become short of breath/difficulty breathing, swelling of tongue go to ER immediately  Contact Dermatitis Dermatitis is redness, soreness, and swelling (inflammation) of the skin. Contact dermatitis is a reaction to certain substances that touch the skin. There are two types of this condition: Irritant contact dermatitis. This is the most common type. It happens when something irritates your skin, such as when your hands get dry from washing them too often with soap. You can get this type of reaction even if you have not been exposed to the irritant before. Allergic contact dermatitis. This type is caused by a substance that you are allergic to, such as poison ivy. It occurs when you have been exposed to the substance (allergen) and form a sensitivity to it. In some cases, the reaction may start soon after your first exposure to the allergen. In other cases, it may not start until you are exposed to the allergen again. It may then occur every time you are exposed to the allergen in the future. What are the causes? Irritant contact dermatitis is often caused by exposure to: Makeup. Soaps, detergents, and bleaches. Acids. Metal salts, such as nickel. Allergic contact dermatitis is often caused by exposure to: Poisonous plants. Chemicals. Jewelry. Latex. Medicines. Preservatives in products, such as clothes. What increases the risk? You are more likely to get this condition if you have: A job that exposes you to irritants or allergens. Certain medical conditions. These include asthma and eczema. What are the signs or symptoms? Symptoms of this condition may occur in any place on your body that has been touched by the irritant. Symptoms include: Dryness, flaking, or cracking. Redness. Itching. Pain or a burning feeling. Blisters. Drainage of small amounts of blood or clear fluid from skin cracks. With allergic contact dermatitis, there may also be swelling in  areas such as the eyelids, mouth, or genitals. How is this diagnosed? This condition is diagnosed with a medical history and physical exam. A patch skin test may be done to help figure out the cause. If the condition is related to your job, you may need to see an expert in health problems in the workplace (occupational medicine specialist). How is this treated? This condition is treated by staying away from the cause of the reaction and protecting your skin from further contact. Treatment may also include: Steroid creams or ointments. Steroid medicines may need be taken by mouth (orally) in more severe cases. Antibiotics or medicines applied to the skin to kill bacteria (antibacterial ointments). These may be needed if a skin infection is present. Antihistamines. These may be taken orally or put on as a lotion to ease itching. A bandage (dressing). Follow these instructions at home: Skin care Moisturize your skin as needed. Put cool, wet cloths (cool compresses) on the affected areas. Try applying baking soda paste to your skin. Stir water into baking soda until it has the consistency of a paste. Do not scratch your skin. Avoid friction to the affected area. Avoid the use of soaps, perfumes, and dyes. Check the affected areas every day for signs of infection. Check for: More redness, swelling, or pain. More fluid or blood. Warmth. Pus or a bad smell. Medicines Take or apply over-the-counter and prescription medicines only as told by your health care provider. If you were prescribed antibiotics, take or apply them as told by your health care provider. Do not stop using the antibiotic even if you start to feel better. Bathing Try taking a bath  with: Epsom salts. Follow the instructions on the packaging. You can get these at your local pharmacy or grocery store. Baking soda. Pour a small amount into the bath as told by your health care provider. Colloidal oatmeal. Follow the instructions  on the packaging. You can get this at your local pharmacy or grocery store. Bathe less often. This may mean bathing every other day. Bathe in lukewarm water. Avoid using hot water. Bandage care If you were given a dressing, change it as told by your health care provider. Wash your hands with soap and water for at least 20 seconds before and after you change your dressing. If soap and water are not available, use hand sanitizer. General instructions Avoid the substance that caused your reaction. If you do not know what caused it, keep a journal to try to track what caused it. Write down: What you eat and drink. What cosmetics you use. What you wear in the affected area. This includes jewelry. Contact a health care provider if: Your condition does not get better with treatment. Your condition gets worse. You have any signs of infection. You have a fever. You have new symptoms. Your bone or joint under the affected area becomes painful after the skin has healed. Get help right away if: You notice red streaks coming from the affected area. The affected area turns darker. You have trouble breathing. This information is not intended to replace advice given to you by your health care provider. Make sure you discuss any questions you have with your health care provider. Document Revised: 09/24/2021 Document Reviewed: 09/24/2021 Elsevier Patient Education  2024 [employer].

## 2022-12-28 ENCOUNTER — Telehealth: Payer: Self-pay | Admitting: Hematology

## 2022-12-28 NOTE — Telephone Encounter (Signed)
Patient is aware of scheduled appointment times/dates

## 2022-12-29 ENCOUNTER — Other Ambulatory Visit: Payer: Medicare Other

## 2022-12-29 DIAGNOSIS — I10 Essential (primary) hypertension: Secondary | ICD-10-CM | POA: Diagnosis not present

## 2022-12-29 LAB — CBC WITH DIFFERENTIAL/PLATELET
Absolute Monocytes: 762 cells/uL (ref 200–950)
Basophils Absolute: 22 cells/uL (ref 0–200)
Basophils Relative: 0.4 %
Eosinophils Absolute: 241 cells/uL (ref 15–500)
Eosinophils Relative: 4.3 %
HCT: 42.7 % (ref 35.0–45.0)
Hemoglobin: 14.3 g/dL (ref 11.7–15.5)
Lymphs Abs: 1960 cells/uL (ref 850–3900)
MCH: 31.7 pg (ref 27.0–33.0)
MCHC: 33.5 g/dL (ref 32.0–36.0)
MCV: 94.7 fL (ref 80.0–100.0)
MPV: 12.2 fL (ref 7.5–12.5)
Monocytes Relative: 13.6 %
Neutro Abs: 2615 cells/uL (ref 1500–7800)
Neutrophils Relative %: 46.7 %
Platelets: 176 10*3/uL (ref 140–400)
RBC: 4.51 10*6/uL (ref 3.80–5.10)
RDW: 12.7 % (ref 11.0–15.0)
Total Lymphocyte: 35 %
WBC: 5.6 10*3/uL (ref 3.8–10.8)

## 2022-12-30 LAB — COMPLETE METABOLIC PANEL WITH GFR
AG Ratio: 2.2 (calc) (ref 1.0–2.5)
ALT: 15 U/L (ref 6–29)
AST: 18 U/L (ref 10–35)
Albumin: 4.6 g/dL (ref 3.6–5.1)
Alkaline phosphatase (APISO): 58 U/L (ref 37–153)
BUN/Creatinine Ratio: 17 (calc) (ref 6–22)
BUN: 23 mg/dL (ref 7–25)
CO2: 26 mmol/L (ref 20–32)
Calcium: 10.3 mg/dL (ref 8.6–10.4)
Chloride: 106 mmol/L (ref 98–110)
Creat: 1.37 mg/dL — ABNORMAL HIGH (ref 0.60–0.95)
Globulin: 2.1 g/dL (ref 1.9–3.7)
Glucose, Bld: 93 mg/dL (ref 65–99)
Potassium: 4.3 mmol/L (ref 3.5–5.3)
Sodium: 138 mmol/L (ref 135–146)
Total Bilirubin: 1.2 mg/dL (ref 0.2–1.2)
Total Protein: 6.7 g/dL (ref 6.1–8.1)
eGFR: 39 mL/min/{1.73_m2} — ABNORMAL LOW (ref >=60)

## 2023-01-19 DIAGNOSIS — L408 Other psoriasis: Secondary | ICD-10-CM | POA: Diagnosis not present

## 2023-01-19 DIAGNOSIS — L218 Other seborrheic dermatitis: Secondary | ICD-10-CM | POA: Diagnosis not present

## 2023-02-06 NOTE — Progress Notes (Unsigned)
OV 02/07/2023  Subjective:  Patient ID: Jacqueline Hancock, female , DOB: [birth year] , age 82 y.o. , MRN: [id number] , ADDRESS: [address] [place] [address] PCP Lucky Cowboy, MD Patient Care Team: Lucky Cowboy, MD as PCP - General (Internal Medicine) Flo Shanks, MD as Consulting Physician (Otolaryngology) Sharrell Ku, MD as Consulting Physician (Gastroenterology) Pennie Rushing Maris Berger, MD (Inactive) as Consulting Physician (Obstetrics and Gynecology) Jethro Bolus, MD as Consulting Physician (Ophthalmology) Charlett Nose, [facility] (Inactive) as Pharmacist (Pharmacist) Fleeta Emmer, RN as [facility] Care Management  This Provider for this visit: Treatment Team:  Attending Provider: Kalman Shan, MD    02/07/2023 -   Chief Complaint  Patient presents with   Consult    Ct 8/30, denies sob, does occ cough     HPI Jacqueline Hancock 82 y.o. -new consult referred by Dr. Wyvonnia Lora and oncology.  History is provided by talking to her and also review of the medical records.  Both her history and records correlate well.  She has chronic lymphocytic leukemia.  She also has hemolytic anemia status post bone marrow biopsy completed December 2021.  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  At that time this is CT scan of the chest that I personally visualized and the lung fields are clear.   She was started on ibrutinib in January 2022 and by the summer 2023 the dose was reduced.  During her visit on September 12, 2022 she complained of skin rashes and itchiness throughout the body [ibrutinib can cause greater than 13% itchiness] there is also scaliness.  At this point in time she was having some mild cough.  A CT scan of the chest for that visit done in May 2024 did show some groundglass opacities.  The consensus was that she had  toxicity due to ibrutinib she was given prednisone.  Ibrutinib was held.  Then when she followed up in October 14, 2022 the rash had resolved the itchiness was still there but it did improve.  She is still having some cough.  She was given prednisone which she completed by this visit.  The prednisone did help improve the rash.  Plan was to get CT scan of the chest and she followed up mid September 2024.  At this point in time she had been off ibrutinib for 4 months.  Itchiness had improve significantly.  She did have a CT scan of the chest August 2024 that showed progress in groundglass opacities [present visualized and confirmed] and therefore she has been referred here.  At this time of visit she says she is back to baseline health there is no itching.  The cough is very mild.  Increases with perfumes and this little wheezing.  These are chronic issues.  Shortness of breath is also chronic.  The itchiness is completely resolved she states of ibrutinib.  She does note that she is lives in 82 year old house and that might be mold in it  She is here at the request of oncology and her current symptoms are below    Her exercise hypoxemia test is normal.   SYMPTOM SCALE - ILD 02/07/2023  Current weight   O2 use ra  Shortness of Breath 0 -> 5 scale with 5 being worst (score 6 If unable to do)  At rest 0  Simple tasks - showers, clothes  change, eating, shaving 0  Household (dishes, doing bed, laundry) 0  Shopping 0  Walking level at own pace 0  Walking up Stairs 4  Total (30-36) Dyspnea Score 4  How bad is your cough? 2  How bad is your fatigue 2  How bad is nausea 00  How bad is vomiting?  0  How bad is diarrhea? 0  How bad is anxiety? 0  How bad is depression 0  Any chronic pain - if so where and how bad 0        Simple office walk 224 (66+46 x 2) feet Pod A at [facility] x  3 laps goal with forehead probe 02/07/2023    O2 used ra   Number laps completed 15 sit anda   Comments about  pace good   Resting Pulse Ox/HR 99% and 65/min   Final Pulse Ox/HR 99% and 75/min   Desaturated </= 88% no   Desaturated <= 3% points no   Got Tachycardic >/= 90/min no   Symptoms at end of test non   Miscellaneous comments norma      CT Chest data from date:   - personally visualized and independently interpreted : yes - my findings are: Groundglass opacities and not probable UIP Narrative & Impression  CLINICAL DATA:  82 year old female with history of CLL. Evaluate for interstitial lung disease. * Tracking Code: BO *   EXAM: CT CHEST WITHOUT CONTRAST   TECHNIQUE: Multidetector CT imaging of the chest was performed following the standard protocol without intravenous contrast. High resolution imaging of the lungs, as well as inspiratory and expiratory imaging, was performed.   RADIATION DOSE REDUCTION: This exam was performed according to the departmental dose-optimization program which includes automated exposure control, adjustment of the mA and/or kV according to patient size and/or use of iterative reconstruction technique.   COMPARISON:  No priors.   FINDINGS: Cardiovascular: Heart size is normal. There is no significant pericardial fluid, thickening or pericardial calcification. There is aortic atherosclerosis, as well as atherosclerosis of the great vessels of the mediastinum and the coronary arteries, including calcified atherosclerotic plaque in the left circumflex coronary artery.   Mediastinum/Nodes: No pathologically enlarged mediastinal or hilar lymph nodes. Please note that accurate exclusion of hilar adenopathy is limited on noncontrast CT scans. Small hiatal hernia. No axillary lymphadenopathy.   Lungs/Pleura: High-resolution images demonstrate scattered areas of ground-glass attenuation, septal thickening, thickening of the peribronchovascular interstitium and some mild subpleural reticulation, predominantly in the mid to lower lungs  bilaterally. Minimal traction bronchiectasis is also noted. No frank honeycombing. Findings appear only mildly progressive compared to prior examination from 2021. Inspiratory and expiratory imaging is limited by patient respiratory motion, essentially nondiagnostic. No confluent consolidative airspace disease. No pleural effusions. No definite suspicious appearing pulmonary nodules or masses are noted.   Upper Abdomen: 5 mm nonobstructive calculus in the left renal collecting system. Aortic atherosclerosis.   Musculoskeletal: There are no aggressive appearing lytic or blastic lesions noted in the visualized portions of the skeleton.   IMPRESSION: 1. 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 Repeat high-resolution chest CT should be considered in 12 months to assess for temporal changes in the appearance of the lung parenchyma. 2. Aortic atherosclerosis, in addition to left circumflex coronary artery disease. 3. 5 mm nonobstructive calculus in the left renal collecting system.   Aortic Atherosclerosis (ICD10-I70.0).     Electronically Signed  By: Trudie Mainville M.D.   On: 12/11/2022 11:50      PFT      No data to display            LAB RESULTS last 96 hours No results found.  LAB RESULTS last 90 days Recent Results (from the past 2160 hour(s))  CMP (Cancer Center only)     Status: Abnormal   Collection Time: 12/16/22 10:25 AM  Result Value Ref Range   Sodium 143 135 - 145 mmol/L   Potassium 4.5 3.5 - 5.1 mmol/L   Chloride 110 98 - 111 mmol/L   CO2 27 22 - 32 mmol/L   Glucose, Bld 86 70 - 99 mg/dL    Comment: Glucose reference range applies only to samples taken after fasting for at least 8 hours.   BUN 23 8 - 23 mg/dL   Creatinine 1.61 (H) 0.96 - 1.00 mg/dL   Calcium 04.5 8.9 - 40.9 mg/dL   Total Protein 6.6 6.5 - 8.1 g/dL   Albumin 4.3 3.5  - 5.0 g/dL   AST 21 15 - 41 U/L   ALT 21 0 - 44 U/L   Alkaline Phosphatase 56 38 - 126 U/L   Total Bilirubin 1.1 0.3 - 1.2 mg/dL   GFR, Estimated 40 (L) >60 mL/min    Comment: (NOTE) Calculated using the CKD-EPI Creatinine Equation (2021)    Anion gap 6 5 - 15    Comment: Performed at [facility], [address] [place], [place] [address]  CBC with Differential (Cancer Center Only)     Status: Abnormal   Collection Time: 12/16/22 10:25 AM  Result Value Ref Range   WBC Count 5.9 4.0 - 10.5 K/uL   RBC 4.24 3.87 - 5.11 MIL/uL   Hemoglobin 13.4 12.0 - 15.0 g/dL   HCT 47.8 29.5 - 62.1 %   MCV 95.5 80.0 - 100.0 fL   MCH 31.6 26.0 - 34.0 pg   MCHC 33.1 30.0 - 36.0 g/dL   RDW 30.8 65.7 - 84.6 %   Platelet Count 139 (L) 150 - 400 K/uL   nRBC 0.0 0.0 - 0.2 %   Neutrophils Relative % 49 %   Neutro Abs 2.9 1.7 - 7.7 K/uL   Lymphocytes Relative 33 %   Lymphs Abs 1.9 0.7 - 4.0 K/uL   Monocytes Relative 12 %   Monocytes Absolute 0.7 0.1 - 1.0 K/uL   Eosinophils Relative 5 %   Eosinophils Absolute 0.3 0.0 - 0.5 K/uL   Basophils Relative 1 %   Basophils Absolute 0.0 0.0 - 0.1 K/uL   Immature Granulocytes 0 %   Abs Immature Granulocytes 0.01 0.00 - 0.07 K/uL    Comment: Performed at [facility], [address], [place], [place] [address]  COMPLETE METABOLIC PANEL WITH GFR     Status: Abnormal   Collection Time: 12/29/22 10:31 AM  Result Value Ref Range   Glucose, Bld 93 65 - 99 mg/dL    Comment: .            Fasting reference interval .    BUN 23 7 - 25 mg/dL   Creat 2.84 (H) 1.32 - 0.95 mg/dL   eGFR 39 (L) > OR = 60 mL/min/1.32m2   BUN/Creatinine Ratio 17 6 - 22 (calc)   Sodium 138 135 - 146 mmol/L   Potassium 4.3 3.5 - 5.3 mmol/L   Chloride 106 98 - 110 mmol/L   CO2 26  20 - 32 mmol/L   Calcium 10.3 8.6 - 10.4 mg/dL   Total Protein 6.7 6.1 - 8.1 g/dL   Albumin 4.6 3.6 - 5.1 g/dL   Globulin 2.1 1.9 - 3.7 g/dL (calc)   AG  Ratio 2.2 1.0 - 2.5 (calc)   Total Bilirubin 1.2 0.2 - 1.2 mg/dL   Alkaline phosphatase (APISO) 58 37 - 153 U/L   AST 18 10 - 35 U/L   ALT 15 6 - 29 U/L  CBC with Differential/Platelet     Status: None   Collection Time: 12/29/22 10:31 AM  Result Value Ref Range   WBC 5.6 3.8 - 10.8 Thousand/uL   RBC 4.51 3.80 - 5.10 Million/uL   Hemoglobin 14.3 11.7 - 15.5 g/dL   HCT 40.9 81.1 - 91.4 %   MCV 94.7 80.0 - 100.0 fL   MCH 31.7 27.0 - 33.0 pg   MCHC 33.5 32.0 - 36.0 g/dL   RDW 78.2 95.6 - 21.3 %   Platelets 176 140 - 400 Thousand/uL   MPV 12.2 7.5 - 12.5 fL   Neutro Abs 2,615 1,500 - 7,800 cells/uL   Lymphs Abs 1,960 850 - 3,900 cells/uL   Absolute Monocytes 762 200 - 950 cells/uL   Eosinophils Absolute 241 15 - 500 cells/uL   Basophils Absolute 22 0 - 200 cells/uL   Neutrophils Relative % 46.7 %   Total Lymphocyte 35.0 %   Monocytes Relative 13.6 %   Eosinophils Relative 4.3 %   Basophils Relative 0.4 %         has a past medical history of Dyspnea (2021), Early cataracts, bilateral, Elevated hemoglobin A1c, Fibroids, GERD (gastroesophageal reflux disease), Hyperlipidemia, Hypertension, and Osteopenia.   reports that she has never smoked. She has never used smokeless tobacco.  Past Surgical History:  Procedure Laterality Date   ABDOMINAL HYSTERECTOMY     APPENDECTOMY     CATARACT EXTRACTION, BILATERAL Bilateral 09/2017   Dr. 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   TONSILLECTOMY AND ADENOIDECTOMY      Allergies  Allergen Reactions   Nexium [Esomeprazole Magnesium] Other (See Comments)    Headache    Immunization History  Administered Date(s) Administered   Influenza, High Dose Seasonal PF 01/28/2015, 12/11/2015, 01/25/2017, 01/22/2018, 01/10/2019, 02/03/2019, 02/05/2020, 02/03/2021, 02/03/2022, 12/27/2022   Influenza-Unspecified 01/29/2013, 12/27/2013, 01/25/2017   PFIZER(Purple Top)SARS-COV-2 Vaccination 05/11/2019, 06/03/2019   PPD Test  02/25/2013   Pfizer Covid-19 Vaccine Bivalent Booster 46yrs & up 01/20/2022   Pneumococcal Conjugate-13 01/24/2017   Pneumococcal Polysaccharide-23 02/25/2013   Td 09/13/2001, 02/20/2012   Tdap 02/25/2013   Zoster, Live 10/23/2006    Family History  Problem Relation Age of Onset   Leukemia Mother    Hypertension Father    Diabetes Sister    Heart disease Brother    Diabetes Brother    Diabetes Brother      Current Outpatient Medications:    B Complex Vitamins (VITAMIN B-COMPLEX PO), Take by mouth., Disp: , Rfl:    calcium carbonate (OSCAL) 1500 (600 Ca) MG TABS tablet, Take 500 mg by mouth daily., Disp: , Rfl:    Cholecalciferol (VITAMIN D) 2000 UNITS tablet, Take 4,000 Units by mouth daily. , Disp: , Rfl:    Ferrous Sulfate (IRON) 325 (65 Fe) MG TABS, Take 325 mg by mouth daily. , Disp: , Rfl:    fluticasone (FLONASE) 50 MCG/ACT nasal spray, Place 2 sprays into both nostrils daily., Disp: 16 g, Rfl: 2  losartan (COZAAR) 25 MG tablet, Take 1 tablet (25 mg total) by mouth daily., Disp: , Rfl:    montelukast (SINGULAIR) 10 MG tablet, Take 1 tablet (10 mg total) by mouth daily., Disp: 90 tablet, Rfl: 2   nadolol (CORGARD) 20 MG tablet, TAKE 1 TABLET BY MOUTH DAILY FOR BLOOD PRESSURE OR TREMORS, Disp: 90 tablet, Rfl: 3   nystatin (MYCOSTATIN) 100000 UNIT/ML suspension, 5 ml four times a day, retain in mouth as long as possible (Swish and Spit).  Use for 48 hours after symptoms resolve., Disp: 80 mL, Rfl: 0   olopatadine (PATANOL) 0.1 % ophthalmic solution, Place 1 drop into both eyes 2 (two) times daily., Disp: 5 mL, Rfl: 1   tobramycin-dexamethasone (TOBRADEX) ophthalmic solution, 1 drop 4 (four) times daily., Disp: , Rfl:       Objective:   Vitals:   02/07/23 0919  BP: (!) 144/81  Pulse: 63  SpO2: 99%  Weight: 157 lb 9.6 oz (71.5 kg)  Height: 4\' 11"  (1.499 m)    Estimated body mass index is 31.83 kg/m as calculated from the following:   Height as of this encounter: 4'  11" (1.499 m).   Weight as of this encounter: 157 lb 9.6 oz (71.5 kg).  @WEIGHTCHANGE @  [employer]   02/07/23 0919  Weight: 157 lb 9.6 oz (71.5 kg)     Physical Exam   General: No distress. Looks well O2 at rest: no Cane present: no Sitting in wheel chair: o Frail: no Obese: no Neuro: Alert and Oriented x 3. GCS 15. Speech normal Psych: Pleasant Resp:  Barrel Chest - no.  Wheeze - no, Crackles - no, No overt respiratory distress CVS: Normal heart sounds. Murmurs - no Ext: Stigmata of Connective Tissue Disease - no HEENT: Normal upper airway. PEERL +. No post nasal drip        Assessment:       ICD-10-CM   1. ILD (interstitial lung disease) (HCC)  J84.9 Pulmonary function test    Sed Rate (ESR)    ANA+ENA+DNA/DS+Scl 70+SjoSSA/B    Rheumatoid Factor    Cyclic citrul peptide antibody, IgG    Sjogren's syndrome antibods(ssa + ssb)    Anti-scleroderma antibody    CK Total (and CKMB)    Aldolase    Hypersensitivity Pneumonitis    QuantiFERON-TB Gold Plus    2. 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  She is actually beginning to feel better.  The itching is completely gone.  The pulmonary toxicity with ibrutinib was not that well-described but I would not be surprised if that is the cause.  She had normal clear lung fields in 2021.  However we need to rule out other etiologies such as autoimmune disease or hypersensitive pneumonitis after all she lives in an old house.  Get some blood work and CT scan of the chest echocardiogram and reassess.  Her oxygen levels are holding so it is not an emergent issue.  However indicated that is an important issue.  And she is understanding.    Plan:     Patient Instructions     ICD-10-CM   1. ILD (interstitial lung disease) (HCC)  J84.9     2. DOE (dyspnea on exertion)   R06.09       - I am concerned you  have Interstitial  Lung Disease (ILD)  -  There are MANY varieties of this; But I suspect this could be ibrutinib related or other causes -Currently it is mild and not affecting your oxygen levels -Future course is not known but may be goes away because ibrutinib was stopped -However, to narrow down possibilities and assess severity please do the following  PLAN  - take ILD questionnaire packet with you and bring it back at next visit - do spirometry and dlco test (no BD response, no lung volumes; choose a location depending on schedule and convenience)  - - do following blood work - autoimmune panel: Serum: ESR, ANA, DS-DNA, RF, anti-CCP, ssA, ssB, scl-70, Total CK,  Aldolase,   - do serum Hypersensitivity Pneumonitis Panel   - do blood  Quantiferon Gold   -Do blood ESR   -Do blood BNP   -Do echocardiogram  Follow-up - Return in 4 weeks but after completing all of the about to see Dr. Sherrin Daisy Return in about 4 weeks (around 03/07/2023) for 30 min visit, Face to Face Visit, ILD, with Dr Marchelle Gearing.    SIGNATURE    Dr. Kalman Shan, M.D., F.C.C.P,  Pulmonary and Critical Care Medicine Staff Physician, [facility] Center Director - Interstitial Lung Disease  Program  Pulmonary Fibrosis [facility] at [facility] [place], [place], [address]  Pager: [phone], If no answer or between  15:00h - 7:00h: call [phone] Telephone: [phone]  10:04 AM 02/07/2023

## 2023-02-07 ENCOUNTER — Ambulatory Visit: Payer: Medicare Other | Admitting: Internal Medicine

## 2023-02-07 ENCOUNTER — Encounter: Payer: Self-pay | Admitting: Internal Medicine

## 2023-02-07 VITALS — BP 144/81 | HR 63 | Ht 59.0 in | Wt 157.6 lb

## 2023-02-07 DIAGNOSIS — J849 Interstitial pulmonary disease, unspecified: Secondary | ICD-10-CM

## 2023-02-07 DIAGNOSIS — I700 Atherosclerosis of aorta: Secondary | ICD-10-CM | POA: Diagnosis not present

## 2023-02-07 DIAGNOSIS — R0609 Other forms of dyspnea: Secondary | ICD-10-CM

## 2023-02-07 NOTE — Patient Instructions (Addendum)
ICD-10-CM   1. ILD (interstitial lung disease) (HCC)  J84.9     2. DOE (dyspnea on exertion)  R06.09       - I am concerned you  have Interstitial Lung Disease (ILD)  -  There are MANY varieties of this; But I suspect this could be ibrutinib related or other causes -Currently it is mild and not affecting your oxygen levels -Future course is not known but may be goes away because ibrutinib was stopped -However, to narrow down possibilities and assess severity please do the following  PLAN  - take ILD questionnaire packet with you and bring it back at next visit - do spirometry and dlco test (no BD response, no lung volumes; choose a location depending on schedule and convenience)  - - do following blood work - autoimmune panel: Serum: ESR, ANA, DS-DNA, RF, anti-CCP, ssA, ssB, scl-70, Total CK,  Aldolase,   - do serum Hypersensitivity Pneumonitis Panel   - do blood  Quantiferon Gold   -Do blood ESR   -Do blood BNP   -Do echocardiogram  Follow-up - Return in 4 weeks but after completing all of the about to see Dr. Marchelle Gearing

## 2023-02-13 NOTE — Progress Notes (Unsigned)
Complete Physical  Assessment / Plan   [name] was seen today for annual exam.  Diagnoses and all orders for this visit:  Encounter for general adult medical examination with abnormal findings Yearly  Essential hypertension Continue current medications: Losartan 50 mg QD Monitor blood pressure at home; call if consistently over 130/80 Continue DASH diet.   Reminder to go to the ER if any CP, SOB, nausea, dizziness, severe HA, changes vision/speech, left arm numbness and tingling and jaw pain. -     CBC with Differential/Platelet -     COMPLETE METABOLIC PANEL WITH GFR -     TSH -     EKG 12-Lead  Interstitial Lung Disease(HCC) Continue to follow with Dr. Marchelle Gearing  Hyperlipidemia, unspecified hyperlipidemia type Controlled with diet and exercise Discussed dietary and exercise modifications Low fat diet -     Lipid panel  Gastroesophageal reflux disease, unspecified whether esophagitis present Doing well at this time Diet discussed Monitor for triggers Avoid food with high acid content Avoid excessive cafeine Increase water intake   Vitamin D deficiency Continue supplementation to maintain goal of 70-100 Taking Vitamin D 5,000 IU daily -     VITAMIN D 25 Hydroxy (Vit-D Deficiency, Fractures)  Osteopenia, unspecified location DEXA 03/2019 osteopenia Continue calcium , vit d and weight bearing exercises Follows with GYN .  Stage 3 chronic kidney disease, unspecified whether stage 3a or 3b CKD Increase fluids  Avoid NSAIDS Blood pressure control Monitor sugars  Will continue to monitor -     Urinalysis w microscopic + reflex cultur - Microalbumin/creatinine urine ratio   Abnormal glucose Discussed dietary and exercise modifications -     Hemoglobin A1c  Cerumen impaction right  Ear Lavage performed TM WNL  Obesity Long discussion about weight loss, diet, and exercise Recommended diet heavy in fruits and veggies and low in animal meats, cheeses, and dairy  products, appropriate calorie intake Patient will work on decreasing saturated fats and simple carbs Follow up at next visit  Seborrheic dermatitis Continue to follow with dermatology  Medication management Magnesium  CLL(HCC) Autoimmune hemolytic anemia Continue to follow with Dr. Juliette Alcide, oncology  Diastasis recti To monitor if she develops severe abdominal pain with fever, vomiting or nausea go to the ER  Screening for ischemic heart disease - EKG  Screening for hematuria/proteinuria - Routine UA with reflex microscopic - Microalbumin/creatinine urine ratio  Screening for thyroid disordere - TSH  Screening for AAA - ABD [place] retroperitoneal LTD   Over 40 minutes of face to face interview, exam, counseling, chart review, and critical decision making was performed Future Appointments  Date Time Provider Department Center  02/16/2023 10:00 AM Fleeta Emmer, RN [facility] None  02/24/2023 10:30 AM [facility] PFT ROOM [facility] None  03/14/2023 11:30 AM [facility] LAB [facility] None  03/14/2023 12:00 PM Johney Maine, MD [facility] None  03/16/2023  1:30 PM Kalman Shan, MD [facility] None  02/14/2024 10:00 AM [name], NP [facility] None      Subjective:   Jacqueline Hancock is a 82 y.o.  female who presents for CPE and 3 month follow up on HTN, HLD, prediabetes/abnormal glucose, vitamin D def.   She was evaluated by dermatology for red face , itchiness and scaling. She is using ketoconazole shampoo every other day to scalp.  Fluocinolone oil twice a day to scalp 2 weeks on and 1 week off. Ketoconazole shampoo to chest let sit and rinse, every other day and fluocinolone oil twice a day. Face  and ears:ketoconazole 2% cream twice a day for 3 weeks and fluticasone cream twice a day 1 week on and 1 week off.  She is scheduled to follow up.  Symptoms are greatly improved. Diagnosed with seborrheic dermatitis  She is being evaluated by pulmonolgy for  interstitial lung disease. She is to follow up for PFT testing.   She continues to follow with Dr. Candise Che for CLL, currently off medication and continuing to follow up and monitor.   BMI is Body mass index is 31.87 kg/m., she is working on diet and exercise. Wt Readings from Last 3 Encounters:  02/14/23 157 lb 12.8 oz (71.6 kg)  02/07/23 157 lb 9.6 oz (71.5 kg)  12/27/22 157 lb 12.8 oz (71.6 kg)   She feels a scratchy sensation in her throat on right side with swallowing. No difficulty swallowing pills or food.   She has not been checking her blood pressure at home, Bp controlled with Losartan 25 mg QD and Nadolol 20 mg QD, today their BP is BP: 122/62.  BP Readings from Last 3 Encounters:  02/14/23 122/62  02/07/23 (!) 144/81  12/27/22 138/68  She does workout, does it in her house. She denies chest pain, shortness of breath, dizziness.    She is not on cholesterol medication . Her cholesterol is not at goal. She is limiting dairy and red meat and other saturated fats. The cholesterol last visit was:   Lab Results  Component Value Date   CHOL 222 (H) 08/16/2022   HDL 52 08/16/2022   LDLCALC 140 (H) 08/16/2022   TRIG 161 (H) 08/16/2022   CHOLHDL 4.3 08/16/2022   She has been working on diet and exercise for prediabetes, and denies paresthesia of the feet, polydipsia, polyuria and visual disturbances. Last A1C in the office was:  Lab Results  Component Value Date   HGBA1C 5.6 08/16/2022   She has been trying to drink more water. She has been drinking more juice.  Lab Results  Component Value Date   EGFR 39 (L) 12/29/2022    Patient is on Vitamin D supplement. Lab Results  Component Value Date   VD25OH 69 02/03/2022      Medication Review Current Outpatient Medications on File Prior to Visit  Medication Sig Dispense Refill   B Complex Vitamins (VITAMIN B-COMPLEX PO) Take by mouth.     calcium carbonate (OSCAL) 1500 (600 Ca) MG TABS tablet Take 500 mg by mouth daily.      Cholecalciferol (VITAMIN D) 2000 UNITS tablet Take 4,000 Units by mouth daily.      Ferrous Sulfate (IRON) 325 (65 Fe) MG TABS Take 325 mg by mouth daily.      Fluocinolone Acetonide Scalp 0.01 % OIL Apply topically.     fluticasone (CUTIVATE) 0.05 % cream Apply 1 Application topically daily.     fluticasone (FLONASE) 50 MCG/ACT nasal spray Place 2 sprays into both nostrils daily. 16 g 2   guaiFENesin (ROBITUSSIN CHEST CONGESTION PO) Take by mouth.     ketoconazole (NIZORAL) 2 % cream Apply topically 2 (two) times daily.     ketoconazole (NIZORAL) 2 % shampoo Apply topically.     losartan (COZAAR) 25 MG tablet Take 1 tablet (25 mg total) by mouth daily.     montelukast (SINGULAIR) 10 MG tablet Take 1 tablet (10 mg total) by mouth daily. 90 tablet 2   nadolol (CORGARD) 20 MG tablet TAKE 1 TABLET BY MOUTH DAILY FOR BLOOD PRESSURE OR TREMORS 90 tablet 3  nystatin (MYCOSTATIN) 100000 UNIT/ML suspension 5 ml four times a day, retain in mouth as long as possible (Swish and Spit).  Use for 48 hours after symptoms resolve. 80 mL 0   olopatadine (PATANOL) 0.1 % ophthalmic solution Place 1 drop into both eyes 2 (two) times daily. 5 mL 1   tobramycin-dexamethasone (TOBRADEX) ophthalmic solution 1 drop 4 (four) times daily.     [DISCONTINUED] gabapentin (NEURONTIN) 100 MG capsule Take 1 capsule 3 x /day for Neuropathy Pain (Patient not taking: Reported on 01/25/2020) 270 capsule 0   No current facility-administered medications on file prior to visit.    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 [facility]- here for primary care 2. Dr. Nile Riggs, eye doctor, 01/24/22 3. Has partial, dentist, last visit 2 years  Patient Care Team: Lucky Cowboy, MD as PCP - General (Internal Medicine) Flo Shanks, MD as Consulting Physician (Otolaryngology) Sharrell Ku, MD as Consulting Physician (Gastroenterology) Pennie Rushing, Maris Berger, MD (Inactive) as Consulting Physician (Obstetrics and Gynecology) Jethro Bolus, MD as Consulting Physician (Ophthalmology) Charlett Nose, [facility] (Inactive) as Pharmacist (Pharmacist) Fleeta Emmer, RN as [facility] Care Management  Allergies Allergies  Allergen Reactions   Nexium [Esomeprazole Magnesium] Other (See Comments)    Headache    SURGICAL HISTORY She  has a past surgical history that includes Parotid gland tumor excision (2012); Abdominal hysterectomy; Appendectomy; Tonsillectomy and adenoidectomy; and Cataract extraction, bilateral (Bilateral, 09/2017). FAMILY HISTORY Her family history includes Diabetes in her brother, brother, and sister; Heart disease in her brother; Hypertension in her father; Leukemia in her mother. SOCIAL HISTORY She  reports that she has never smoked. She has never used smokeless tobacco. She reports that she does not drink alcohol and does not use drugs.  Review of Systems  Constitutional:  Negative for chills and fever.       Cold hands/feet  HENT:  Negative for congestion, ear pain, hearing loss, sinus pain, sore throat and tinnitus.   Eyes:  Negative for blurred vision and double vision.  Respiratory:  Negative for cough, hemoptysis, sputum production, shortness of breath and wheezing.   Cardiovascular:  Negative for chest pain, palpitations and leg swelling.   Gastrointestinal:  Negative for abdominal pain, constipation, diarrhea, heartburn, nausea and vomiting.       Difficulty swallowing solid food and some pills  Genitourinary:  Negative for dysuria and urgency.  Musculoskeletal:  Positive for back pain. Negative for falls, joint pain, myalgias and neck pain.  Skin:  Negative for itching and rash.       Toenail fungus right 3rd toe  Neurological:  Positive for tingling (toes bilaterally). Negative for dizziness, tremors, weakness and headaches.  Endo/Heme/Allergies:  Does not bruise/bleed easily.  Psychiatric/Behavioral:  Negative for depression and suicidal ideas. The patient is not nervous/anxious and does not have insomnia.       Objective:   Today's Vitals  02/14/23 0955  BP: 122/62  Pulse: 71  Temp: 97.7 F (36.5 C)  SpO2: 98%  Weight: 157 lb 12.8 oz (71.6 kg)  Height: 4\' 11"  (1.499 m)     Body mass index is 31.87 kg/m.  General appearance: alert, no distress, WD/WN,  female HEENT: normocephalic, sclerae anicteric, L TM pearly, R TM obscured with impacted cerumen- ear lavage performed- TM WNL,nares patent, no discharge or erythema, pharynx normal Oral cavity: MMM, no lesions Neck: supple, no lymphadenopathy, no thyromegaly, no masses Heart: RRR, normal S1, S2, no murmurs Lungs: CTA bilaterally, no wheezes, rhonchi, or rales Abdomen: +bs, soft, + epigastric tender, non distended, no masses, no hepatomegaly, no splenomegaly. Abdomen  Musculoskeletal: nontender, no swelling, no obvious deformity.  Extremities: no edema, no cyanosis, no clubbing Pulses: 2+ symmetric, upper and lower extremities, normal cap refill Neurological: alert, oriented x 3, CN2-12 intact, strength normal upper extremities and lower extremities, sensation normal throughout, DTRs 2+ throughout, no cerebellar signs, gait normal Psychiatric: normal affect, behavior normal, pleasant  Skin: warm and dry except hands and feet are cold to touch, onychomycosis  of right 3rd toenail, improved seborrheic dermatitis Breast: defer Gyn: defer Rectal: defer  EKG: NSR, no ST changes AAA: < 3 cm  [facility]  02/14/2023

## 2023-02-14 ENCOUNTER — Encounter: Payer: Self-pay | Admitting: Nurse Practitioner

## 2023-02-14 ENCOUNTER — Ambulatory Visit (INDEPENDENT_AMBULATORY_CARE_PROVIDER_SITE_OTHER): Payer: Medicare Other | Admitting: Nurse Practitioner

## 2023-02-14 VITALS — BP 122/62 | HR 71 | Temp 97.7°F | Ht 59.0 in | Wt 157.8 lb

## 2023-02-14 DIAGNOSIS — R7309 Other abnormal glucose: Secondary | ICD-10-CM | POA: Diagnosis not present

## 2023-02-14 DIAGNOSIS — E782 Mixed hyperlipidemia: Secondary | ICD-10-CM

## 2023-02-14 DIAGNOSIS — Z1389 Encounter for screening for other disorder: Secondary | ICD-10-CM

## 2023-02-14 DIAGNOSIS — E559 Vitamin D deficiency, unspecified: Secondary | ICD-10-CM

## 2023-02-14 DIAGNOSIS — Z79899 Other long term (current) drug therapy: Secondary | ICD-10-CM

## 2023-02-14 DIAGNOSIS — H6121 Impacted cerumen, right ear: Secondary | ICD-10-CM | POA: Diagnosis not present

## 2023-02-14 DIAGNOSIS — K219 Gastro-esophageal reflux disease without esophagitis: Secondary | ICD-10-CM

## 2023-02-14 DIAGNOSIS — I700 Atherosclerosis of aorta: Secondary | ICD-10-CM

## 2023-02-14 DIAGNOSIS — C9110 Chronic lymphocytic leukemia of B-cell type not having achieved remission: Secondary | ICD-10-CM

## 2023-02-14 DIAGNOSIS — J849 Interstitial pulmonary disease, unspecified: Secondary | ICD-10-CM

## 2023-02-14 DIAGNOSIS — N183 Chronic kidney disease, stage 3 unspecified: Secondary | ICD-10-CM | POA: Diagnosis not present

## 2023-02-14 DIAGNOSIS — Z0001 Encounter for general adult medical examination with abnormal findings: Secondary | ICD-10-CM | POA: Diagnosis not present

## 2023-02-14 DIAGNOSIS — I10 Essential (primary) hypertension: Secondary | ICD-10-CM

## 2023-02-14 DIAGNOSIS — M6208 Separation of muscle (nontraumatic), other site: Secondary | ICD-10-CM

## 2023-02-14 DIAGNOSIS — C8310 Mantle cell lymphoma, unspecified site: Secondary | ICD-10-CM

## 2023-02-14 DIAGNOSIS — Z136 Encounter for screening for cardiovascular disorders: Secondary | ICD-10-CM

## 2023-02-14 DIAGNOSIS — M8580 Other specified disorders of bone density and structure, unspecified site: Secondary | ICD-10-CM

## 2023-02-14 DIAGNOSIS — R6889 Other general symptoms and signs: Secondary | ICD-10-CM | POA: Diagnosis not present

## 2023-02-14 DIAGNOSIS — D5919 Other autoimmune hemolytic anemia: Secondary | ICD-10-CM

## 2023-02-14 DIAGNOSIS — E66811 Obesity, class 1: Secondary | ICD-10-CM

## 2023-02-14 DIAGNOSIS — L219 Seborrheic dermatitis, unspecified: Secondary | ICD-10-CM

## 2023-02-14 DIAGNOSIS — Z1329 Encounter for screening for other suspected endocrine disorder: Secondary | ICD-10-CM

## 2023-02-14 NOTE — Patient Instructions (Signed)

## 2023-02-15 LAB — MICROALBUMIN / CREATININE URINE RATIO
Creatinine, Urine: 29 mg/dL (ref 20–275)
Microalb, Ur: <0.2 mg/dL

## 2023-02-15 LAB — URINALYSIS, ROUTINE W REFLEX MICROSCOPIC
Bilirubin Urine: NEGATIVE
Glucose, UA: NEGATIVE
Hgb urine dipstick: NEGATIVE
Ketones, ur: NEGATIVE
Leukocytes,Ua: NEGATIVE
Nitrite: NEGATIVE
Protein, ur: NEGATIVE
Specific Gravity, Urine: 1.005 (ref 1.001–1.035)
pH: 6.5 (ref 5.0–8.0)

## 2023-02-15 LAB — CBC WITH DIFFERENTIAL/PLATELET
Absolute Lymphocytes: 1560 {cells}/uL (ref 850–3900)
Absolute Monocytes: 644 {cells}/uL (ref 200–950)
Basophils Absolute: 19 {cells}/uL (ref 0–200)
Basophils Relative: 0.4 %
Eosinophils Absolute: 249 {cells}/uL (ref 15–500)
Eosinophils Relative: 5.3 %
HCT: 41.2 % (ref 35.0–45.0)
Hemoglobin: 14 g/dL (ref 11.7–15.5)
MCH: 31.3 pg (ref 27.0–33.0)
MCHC: 34 g/dL (ref 32.0–36.0)
MCV: 92 fL (ref 80.0–100.0)
MPV: 12.2 fL (ref 7.5–12.5)
Monocytes Relative: 13.7 %
Neutro Abs: 2228 {cells}/uL (ref 1500–7800)
Neutrophils Relative %: 47.4 %
Platelets: 153 Thousand/uL (ref 140–400)
RBC: 4.48 Million/uL (ref 3.80–5.10)
RDW: 11.7 % (ref 11.0–15.0)
Total Lymphocyte: 33.2 %
WBC: 4.7 Thousand/uL (ref 3.8–10.8)

## 2023-02-15 LAB — COMPLETE METABOLIC PANEL WITHOUT GFR
AG Ratio: 2 (calc) (ref 1.0–2.5)
ALT: 20 U/L (ref 6–29)
AST: 21 U/L (ref 10–35)
Albumin: 4.5 g/dL (ref 3.6–5.1)
Alkaline phosphatase (APISO): 62 U/L (ref 37–153)
BUN/Creatinine Ratio: 18 (calc) (ref 6–22)
BUN: 22 mg/dL (ref 7–25)
CO2: 26 mmol/L (ref 20–32)
Calcium: 10.2 mg/dL (ref 8.6–10.4)
Chloride: 102 mmol/L (ref 98–110)
Creat: 1.19 mg/dL — ABNORMAL HIGH (ref 0.60–0.95)
Globulin: 2.3 g/dL (ref 1.9–3.7)
Glucose, Bld: 98 mg/dL (ref 65–99)
Potassium: 4.5 mmol/L (ref 3.5–5.3)
Sodium: 139 mmol/L (ref 135–146)
Total Bilirubin: 0.7 mg/dL (ref 0.2–1.2)
Total Protein: 6.8 g/dL (ref 6.1–8.1)
eGFR: 46 mL/min/1.73m2 — ABNORMAL LOW (ref >=60)

## 2023-02-15 LAB — TSH: TSH: 1.89 m[IU]/L (ref 0.40–4.50)

## 2023-02-15 LAB — LIPID PANEL
Cholesterol: 183 mg/dL (ref <200)
HDL: 35 mg/dL — ABNORMAL LOW (ref >=50)
LDL Cholesterol (Calc): 115 mg/dL — ABNORMAL HIGH
Non-HDL Cholesterol (Calc): 148 mg/dL — ABNORMAL HIGH (ref <130)
Total CHOL/HDL Ratio: 5.2 (calc) — ABNORMAL HIGH (ref <5.0)
Triglycerides: 221 mg/dL — ABNORMAL HIGH (ref <150)

## 2023-02-15 LAB — VITAMIN D 25 HYDROXY (VIT D DEFICIENCY, FRACTURES): Vit D, 25-Hydroxy: 51 ng/mL (ref 30–100)

## 2023-02-15 LAB — HEMOGLOBIN A1C W/OUT EAG: Hgb A1c MFr Bld: 6.1 %{Hb} — ABNORMAL HIGH (ref <5.7)

## 2023-02-15 LAB — MAGNESIUM: Magnesium: 2.1 mg/dL (ref 1.5–2.5)

## 2023-02-16 ENCOUNTER — Ambulatory Visit: Payer: Self-pay

## 2023-02-16 NOTE — Patient Instructions (Signed)
Visit Information  Thank you for taking time to visit with me today. Please don't hesitate to contact me if I can be of assistance to you.   Following are the goals we discussed today:   Goals Addressed             This Visit's Progress    Health Maintenance       Patient Goals/Self Care Activities: -Patient/Caregiver will self-administer medications as prescribed as evidenced by self-report/primary caregiver report  -Patient/Caregiver will attend all scheduled provider appointments as evidenced by clinician review of documented attendance to scheduled appointments and patient/caregiver report -Patient/Caregiver will call provider office for new concerns or questions as evidenced by review of documented incoming telephone call notes and patient report  -Calls provider office for new concerns, questions, or BP outside discussed parameters -Checks BP and records as discussed -Follows a low sodium diet/DASH diet   Patient reports doing okay getting over a cold. She  reports rash gone after seeing dermatology.  She reports there is some concern about spots on her lung and she has been referred to pulmonology.  She reports last blood pressure reading 139/67.  Discussed HTN Management and low salt diet.  She verbalized understanding.  No concerns.            Our next appointment is by telephone on 04/19/22 at 1000 am  Please call the care guide team at [phone] if you need to cancel or reschedule your appointment.   If you are experiencing a Mental Health or Behavioral Health Crisis or need someone to talk to, please call the Suicide and Crisis Lifeline: 988   The patient verbalized understanding of instructions, educational materials, and care plan provided today and DECLINED offer to receive copy of patient instructions, educational materials, and care plan.   The patient has been provided with contact information for the care management team and has been advised to call with any  health related questions or concerns.   Bary Leriche, RN, MSN [facility] Management Community Coordinator Direct Dial: [phone]  Fax: [phone] Website: [email or web address]

## 2023-02-16 NOTE — Patient Outreach (Signed)
  Care Coordination   Follow Up Visit Note   02/16/2023 Name: Jacqueline Hancock MRN: [id number] DOB: 02/22/1941  Jacqueline Hancock is a 82 y.o. year old female who sees Lucky Cowboy, MD for primary care. I spoke with  [name] by phone today.  What matters to the patients health and wellness today?  Maintain health and independence    Goals Addressed             This Visit's Progress    Health Maintenance       Patient Goals/Self Care Activities: -Patient/Caregiver will self-administer medications as prescribed as evidenced by self-report/primary caregiver report  -Patient/Caregiver will attend all scheduled provider appointments as evidenced by clinician review of documented attendance to scheduled appointments and patient/caregiver report -Patient/Caregiver will call provider office for new concerns or questions as evidenced by review of documented incoming telephone call notes and patient report  -Calls provider office for new concerns, questions, or BP outside discussed parameters -Checks BP and records as discussed -Follows a low sodium diet/DASH diet   Patient reports doing okay getting over a cold. She  reports rash gone after seeing dermatology.  She reports there is some concern about spots on her lung and she has been referred to pulmonology.  She reports last blood pressure reading 139/67.  Discussed HTN Management and low salt diet.  She verbalized understanding.  No concerns.            SDOH assessments and interventions completed:  Yes  SDOH Interventions Today    Flowsheet Row Most Recent Value  SDOH Interventions   Food Insecurity Interventions Intervention Not Indicated  Health Literacy Interventions Intervention Not Indicated        Care Coordination Interventions:  Yes, provided   Follow up plan: Follow up call scheduled for January    Encounter Outcome:  Patient Visit Completed   Bary Leriche, RN, MSN [place]  [facility],  [facility] Management Community Coordinator Direct Dial: [phone]  Fax: [phone] Website: [email or web address]

## 2023-02-21 ENCOUNTER — Other Ambulatory Visit (INDEPENDENT_AMBULATORY_CARE_PROVIDER_SITE_OTHER): Payer: Medicare Other

## 2023-02-21 DIAGNOSIS — J849 Interstitial pulmonary disease, unspecified: Secondary | ICD-10-CM | POA: Diagnosis not present

## 2023-02-21 DIAGNOSIS — R0609 Other forms of dyspnea: Secondary | ICD-10-CM | POA: Diagnosis not present

## 2023-02-21 LAB — SEDIMENTATION RATE: Sed Rate: 10 mm/h (ref 0–30)

## 2023-02-21 LAB — BRAIN NATRIURETIC PEPTIDE: Pro B Natriuretic peptide (BNP): 14 pg/mL (ref 0.0–100.0)

## 2023-02-23 LAB — CK TOTAL AND CKMB (NOT AT ARMC)
CK, MB: 4.7 ng/mL (ref 0–5.0)
Relative Index: 2.5 (ref 0–4.0)
Total CK: 190 U/L — ABNORMAL HIGH (ref 29–143)

## 2023-02-23 LAB — ALDOLASE

## 2023-02-23 LAB — SJOGREN'S SYNDROME ANTIBODS(SSA + SSB)
SSA (Ro) (ENA) Antibody, IgG: <1 AI
SSB (La) (ENA) Antibody, IgG: <1 AI

## 2023-02-23 LAB — QUANTIFERON-TB GOLD PLUS
Mitogen-NIL: 6.99 [IU]/mL
NIL: 0.02 [IU]/mL
QuantiFERON-TB Gold Plus: NEGATIVE
TB1-NIL: 0.01 [IU]/mL
TB2-NIL: 0.01 [IU]/mL

## 2023-02-23 LAB — RHEUMATOID FACTOR: Rheumatoid fact SerPl-aCnc: <10 [IU]/mL (ref <14)

## 2023-02-23 LAB — CYCLIC CITRUL PEPTIDE ANTIBODY, IGG: Cyclic Citrullin Peptide Ab: <16 U

## 2023-02-23 LAB — ANTI-SCLERODERMA ANTIBODY: Scleroderma (Scl-70) (ENA) Antibody, IgG: <1 AI

## 2023-02-24 ENCOUNTER — Ambulatory Visit: Payer: Medicare Other | Admitting: Internal Medicine

## 2023-02-24 ENCOUNTER — Other Ambulatory Visit: Payer: Medicare Other

## 2023-02-24 ENCOUNTER — Other Ambulatory Visit: Payer: Self-pay

## 2023-02-24 DIAGNOSIS — J849 Interstitial pulmonary disease, unspecified: Secondary | ICD-10-CM | POA: Diagnosis not present

## 2023-02-24 DIAGNOSIS — R0609 Other forms of dyspnea: Secondary | ICD-10-CM

## 2023-02-24 LAB — PULMONARY FUNCTION TEST
DL/VA % pred: 127 %
DL/VA: 5.37 ml/min/mmHg/L
DLCO cor % pred: 75 %
DLCO cor: 11.84 ml/min/mmHg
DLCO unc % pred: 77 %
DLCO unc: 12.05 ml/min/mmHg
FEF 25-75 Pre: 0.89 L/s
FEF2575-%Pred-Pre: 86 %
FEV1-%Pred-Pre: 71 %
FEV1-Pre: 1.01 L
FEV1FVC-%Pred-Pre: 107 %
FEV6-%Pred-Pre: 70 %
FEV6-Pre: 1.28 L
FEV6FVC-%Pred-Pre: 107 %
FVC-%Pred-Pre: 66 %
FVC-Pre: 1.28 L
Pre FEV1/FVC ratio: 79 %
Pre FEV6/FVC Ratio: 100 %

## 2023-02-24 NOTE — Patient Instructions (Signed)
Spirometry and Diffusion Capacity performed today.

## 2023-02-24 NOTE — Progress Notes (Signed)
Spirometry/DLCO performed today. 

## 2023-02-25 LAB — HYPERSENSITIVITY PNEUMONITIS
A. Pullulans Abs: NEGATIVE
A.Fumigatus #1 Abs: NEGATIVE
Micropolyspora faeni, IgG: NEGATIVE
Pigeon Serum Abs: NEGATIVE
Thermoact. Saccharii: NEGATIVE
Thermoactinomyces vulgaris, IgG: NEGATIVE

## 2023-02-25 LAB — ANA+ENA+DNA/DS+SCL 70+SJOSSA/B
ANA Titer 1: NEGATIVE
ENA RNP Ab: 3 AI — ABNORMAL HIGH (ref 0.0–0.9)
ENA SM Ab Ser-aCnc: <0.2 AI (ref 0.0–0.9)
ENA SSA (RO) Ab: <0.2 AI (ref 0.0–0.9)
ENA SSB (LA) Ab: <0.2 AI (ref 0.0–0.9)
Scleroderma (Scl-70) (ENA) Antibody, IgG: <0.2 AI (ref 0.0–0.9)
dsDNA Ab: 12 [IU]/mL — ABNORMAL HIGH (ref 0–9)

## 2023-02-27 ENCOUNTER — Other Ambulatory Visit: Payer: Medicare Other

## 2023-02-27 DIAGNOSIS — J849 Interstitial pulmonary disease, unspecified: Secondary | ICD-10-CM | POA: Diagnosis not present

## 2023-03-01 LAB — ALDOLASE: Aldolase: 4.2 U/L (ref <=8.1)

## 2023-03-13 ENCOUNTER — Other Ambulatory Visit: Payer: Self-pay

## 2023-03-13 DIAGNOSIS — C9110 Chronic lymphocytic leukemia of B-cell type not having achieved remission: Secondary | ICD-10-CM

## 2023-03-14 ENCOUNTER — Other Ambulatory Visit (HOSPITAL_BASED_OUTPATIENT_CLINIC_OR_DEPARTMENT_OTHER): Payer: Medicare Other

## 2023-03-14 ENCOUNTER — Inpatient Hospital Stay: Payer: Medicare Other | Attending: Hematology

## 2023-03-14 ENCOUNTER — Inpatient Hospital Stay: Payer: Medicare Other | Admitting: Hematology

## 2023-03-14 VITALS — BP 148/79 | HR 80 | Temp 97.2°F | Resp 20 | Wt 156.6 lb

## 2023-03-14 DIAGNOSIS — C8310 Mantle cell lymphoma, unspecified site: Secondary | ICD-10-CM | POA: Diagnosis not present

## 2023-03-14 DIAGNOSIS — C9111 Chronic lymphocytic leukemia of B-cell type in remission: Secondary | ICD-10-CM | POA: Diagnosis not present

## 2023-03-14 DIAGNOSIS — D591 Autoimmune hemolytic anemia, unspecified: Secondary | ICD-10-CM | POA: Diagnosis not present

## 2023-03-14 DIAGNOSIS — C9110 Chronic lymphocytic leukemia of B-cell type not having achieved remission: Secondary | ICD-10-CM

## 2023-03-14 LAB — CBC WITH DIFFERENTIAL (CANCER CENTER ONLY)
Abs Immature Granulocytes: 0.01 10*3/uL (ref 0.00–0.07)
Basophils Absolute: 0 10*3/uL (ref 0.0–0.1)
Basophils Relative: 1 %
Eosinophils Absolute: 0.3 10*3/uL (ref 0.0–0.5)
Eosinophils Relative: 4 %
HCT: 42 % (ref 36.0–46.0)
Hemoglobin: 14.1 g/dL (ref 12.0–15.0)
Immature Granulocytes: 0 %
Lymphocytes Relative: 38 %
Lymphs Abs: 2.2 10*3/uL (ref 0.7–4.0)
MCH: 30.8 pg (ref 26.0–34.0)
MCHC: 33.6 g/dL (ref 30.0–36.0)
MCV: 91.7 fL (ref 80.0–100.0)
Monocytes Absolute: 0.6 10*3/uL (ref 0.1–1.0)
Monocytes Relative: 11 %
Neutro Abs: 2.7 10*3/uL (ref 1.7–7.7)
Neutrophils Relative %: 46 %
Platelet Count: 142 10*3/uL — ABNORMAL LOW (ref 150–400)
RBC: 4.58 MIL/uL (ref 3.87–5.11)
RDW: 12.1 % (ref 11.5–15.5)
WBC Count: 5.8 10*3/uL (ref 4.0–10.5)
nRBC: 0 % (ref 0.0–0.2)

## 2023-03-14 LAB — CMP (CANCER CENTER ONLY)
ALT: 21 U/L (ref 0–44)
AST: 22 U/L (ref 15–41)
Albumin: 4.6 g/dL (ref 3.5–5.0)
Alkaline Phosphatase: 63 U/L (ref 38–126)
Anion gap: 7 (ref 5–15)
BUN: 33 mg/dL — ABNORMAL HIGH (ref 8–23)
CO2: 25 mmol/L (ref 22–32)
Calcium: 10.6 mg/dL — ABNORMAL HIGH (ref 8.9–10.3)
Chloride: 105 mmol/L (ref 98–111)
Creatinine: 1.43 mg/dL — ABNORMAL HIGH (ref 0.44–1.00)
GFR, Estimated: 37 mL/min — ABNORMAL LOW (ref >60)
Glucose, Bld: 108 mg/dL — ABNORMAL HIGH (ref 70–99)
Potassium: 4.4 mmol/L (ref 3.5–5.1)
Sodium: 137 mmol/L (ref 135–145)
Total Bilirubin: 0.8 mg/dL (ref <1.2)
Total Protein: 7.1 g/dL (ref 6.5–8.1)

## 2023-03-14 NOTE — Progress Notes (Signed)
HEMATOLOGY/ONCOLOGY CLINIC NOTE  Date of Service: 03/14/23  Patient Care Team: Lucky Cowboy, MD as PCP - General (Internal Medicine) Flo Shanks, MD as Consulting Physician (Otolaryngology) Sharrell Ku, MD as Consulting Physician (Gastroenterology) Pennie Rushing Maris Berger, MD (Inactive) as Consulting Physician (Obstetrics and Gynecology) Jethro Bolus, MD as Consulting Physician (Ophthalmology) Charlett Nose, [facility] (Inactive) as Pharmacist (Pharmacist) Fleeta Emmer, RN as [facility] Care Management  CHIEF COMPLAINTS/PURPOSE OF CONSULTATION:  F/u for CLL/SLL  Prior Therapy:  She is status post bone marrow biopsy completed on March 05, 2020.    Prednisone 60 mg daily in the January 2022 to treat her hemolytic anemia.  This was tapered off after complete hematological response.  HISTORY OF PRESENTING ILLNESS:   Jacqueline Hancock is a wonderful 82 y.o. female who is here for continued evaluation and management of NHL, unspecified body region. Patient was following up with Dr. Clelia Croft.   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 BM Bx Cyclin D1 IHC +ve byt FISH neg . She has presented with splenomegaly, anemia, and lymphocytosis. 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   Patient's current treatment is Imbruvica 280 mg. She was started on Imbruvica 560 mg on January 2022, which was then reduced to 280 mg in July 2023.   Patient was last seen by Dr. Clelia Croft on 02/10/2022 and she was doing well overall.   Patient reports she has been doing well overall since our last visit. She does complains of itchy skin rashes, mostly around the face and head, for around 2-3 weeks ago. Patient notes that she was started couple of new medications from other physicians, but she is unsure of the names. Patient notes that she was started on oxycodone around 3-4 weeks ago. She discontinued oxycodone.    She notes that there is redness around her face, which is not new.   Patient is currently taking Imbruvica 280 mg as prescribed and has been tolerating it well without any severe toxicities.   She is currently taking Iron supplement, calcium supplement, and Vitamin-D supplement.   She denies fever, chills, night sweats, unexpected weight loss, back pain, abdominal pain, chest pain, or leg swelling. Patient does complain of occasional abdominal pain.   Patient notes she had a surgery near her right neck/ear area to remove an enlarged lymph node couple years ago. She complains of mild occasional pain near the site.  INTERVAL HISTORY:  Jacqueline Hancock is a wonderful 82 y.o. 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   Patient was last seen by me on 12/16/2022 and she complained of itchy skin throughout her body, mouth redness, mild cough, and abdominal swelling. She reported being off of ibruitinub for about 4 months.  Patient notes she has been doing well overall since our last visit. She notes that her itchiness symptoms and skin rashes have disappeared after she was prescribed a medication by her dermatologist. Patient was seen by her Pulmonologist on 02/07/2023 and has a follow-up on 03/21/2023.   During this visit, she denies any new infection issues, fever, chills, night sweats, unexpected weight loss, back pain, chest pain, or leg swelling.   She drinks 4 16oz of water daily.   MEDICAL HISTORY:  Past Medical History:  Diagnosis Date   Dyspnea 2021   with exersion    Early cataracts, bilateral    Elevated hemoglobin A1c    Fibroids  GERD (gastroesophageal reflux disease)    Hyperlipidemia    Hypertension    Osteopenia     SURGICAL HISTORY: Past Surgical History:  Procedure Laterality Date   ABDOMINAL HYSTERECTOMY     APPENDECTOMY     CATARACT EXTRACTION, BILATERAL Bilateral 09/2017   Dr. 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    TONSILLECTOMY AND ADENOIDECTOMY      SOCIAL HISTORY: Social History   Socioeconomic History   Marital status: Widowed    Spouse name: Not on file   Number of children: Not on file   Years of education: Not on file   Highest education level: Not on file  Occupational History   Not on file  Tobacco Use   Smoking status: Never   Smokeless tobacco: Never  Vaping Use   Vaping status: Never Used  Substance and Sexual Activity   Alcohol use: No   Drug use: Never   Sexual activity: Not on file  Other Topics Concern   Not on file  Social History Narrative   Not on file   Social Determinants of Health   Financial Resource Strain: Not on file  Food Insecurity: No Food Insecurity (02/16/2023)   Hunger Vital Sign    Worried About Running Out of Food in the Last Year: Never true    Ran Out of Food in the Last Year: Never true  Transportation Needs: No Transportation Needs (08/17/2022)   PRAPARE - Administrator, Civil Service (Medical): No    Lack of Transportation (Non-Medical): No  Physical Activity: Not on file  Stress: Not on file  Social Connections: Not on file  Intimate Partner Violence: Not on file    FAMILY HISTORY: Family History  Problem Relation Age of Onset   Leukemia Mother    Hypertension Father    Diabetes Sister    Heart disease Brother    Diabetes Brother    Diabetes Brother     ALLERGIES:  is allergic to nexium [esomeprazole magnesium].  MEDICATIONS:  Current Outpatient Medications  Medication Sig Dispense Refill   B Complex Vitamins (VITAMIN B-COMPLEX PO) Take by mouth.     calcium carbonate (OSCAL) 1500 (600 Ca) MG TABS tablet Take 500 mg by mouth daily.     Cholecalciferol (VITAMIN D) 2000 UNITS tablet Take 4,000 Units by mouth daily.      Ferrous Sulfate (IRON) 325 (65 Fe) MG TABS Take 325 mg by mouth daily.      Fluocinolone Acetonide Scalp 0.01 % OIL Apply topically.     fluticasone (CUTIVATE) 0.05 % cream Apply 1 Application  topically daily.     fluticasone (FLONASE) 50 MCG/ACT nasal spray Place 2 sprays into both nostrils daily. 16 g 2   guaiFENesin (ROBITUSSIN CHEST CONGESTION PO) Take by mouth.     ketoconazole (NIZORAL) 2 % cream Apply topically 2 (two) times daily.     ketoconazole (NIZORAL) 2 % shampoo Apply topically.     losartan (COZAAR) 25 MG tablet Take 1 tablet (25 mg total) by mouth daily.     montelukast (SINGULAIR) 10 MG tablet Take 1 tablet (10 mg total) by mouth daily. 90 tablet 2   nadolol (CORGARD) 20 MG tablet TAKE 1 TABLET BY MOUTH DAILY FOR BLOOD PRESSURE OR TREMORS 90 tablet 3   nystatin (MYCOSTATIN) 100000 UNIT/ML suspension 5 ml four times a day, retain in mouth as long as possible (Swish and Spit).  Use for 48 hours after symptoms resolve.  80 mL 0   olopatadine (PATANOL) 0.1 % ophthalmic solution Place 1 drop into both eyes 2 (two) times daily. 5 mL 1   tobramycin-dexamethasone (TOBRADEX) ophthalmic solution 1 drop 4 (four) times daily.     No current facility-administered medications for this visit.    REVIEW OF SYSTEMS:    10 Point review of Systems was done is negative except as noted above.   PHYSICAL EXAMINATION: ECOG PERFORMANCE STATUS: 2 - Symptomatic, <50% confined to bed  . Vitals:   03/14/23 1149  BP: (!) 148/79  Pulse: 80  Resp: 20  Temp: (!) 97.2 F (36.2 C)  SpO2: 100%    Filed Weights   03/14/23 1149  Weight: 156 lb 9.6 oz (71 kg)   .Body mass index is 31.63 kg/m.  GENERAL:alert, in no acute distress and comfortable SKIN: no acute rashes, no significant lesions EYES: conjunctiva are pink and non-injected, sclera anicteric OROPHARYNX: MMM, no exudates, no oropharyngeal erythema or ulceration NECK: supple, no JVD LYMPH:  no palpable lymphadenopathy in the cervical, axillary or inguinal regions LUNGS: clear to auscultation b/l with normal respiratory effort HEART: regular rate & rhythm ABDOMEN:  normoactive bowel sounds , non tender, not  distended. Extremity: no pedal edema PSYCH: alert & oriented x 3 with fluent speech NEURO: no focal motor/sensory deficits   LABORATORY DATA:  I have reviewed the data as listed  .    Latest Ref Rng & Units 03/14/2023   11:00 AM 02/14/2023   11:19 AM 12/29/2022   10:31 AM  CBC  WBC 4.0 - 10.5 K/uL 5.8  4.7  5.6   Hemoglobin 12.0 - 15.0 g/dL 81.1  91.4  78.2   Hematocrit 36.0 - 46.0 % 42.0  41.2  42.7   Platelets 150 - 400 K/uL 142  153  176    .    Latest Ref Rng & Units 03/14/2023   11:00 AM 02/14/2023   11:19 AM 12/29/2022   10:31 AM  CMP  Glucose 70 - 99 mg/dL 956  98  93   BUN 8 - 23 mg/dL 33  22  23   Creatinine 0.44 - 1.00 mg/dL 2.13  0.86  5.78   Sodium 135 - 145 mmol/L 137  139  138   Potassium 3.5 - 5.1 mmol/L 4.4  4.5  4.3   Chloride 98 - 111 mmol/L 105  102  106   CO2 22 - 32 mmol/L 25  26  26    Calcium 8.9 - 10.3 mg/dL 46.9  62.9  52.8   Total Protein 6.5 - 8.1 g/dL 7.1  6.8  6.7   Total Bilirubin <1.2 mg/dL 0.8  0.7  1.2   Alkaline Phos 38 - 126 U/L 63     AST 15 - 41 U/L 22  21  18    ALT 0 - 44 U/L 21  20  15     .No results found for: "LDH"   PATHOLOGY Surgical Pathology CASE: [id number] PATIENT: Porshea Bordeau Bone Marrow Report     Clinical History: lymphocytosis, possible lymphoma     DIAGNOSIS:  BONE MARROW, ASPIRATE, CLOT, CORE: -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  The latter is primarily seen by flow cytometry.  The findings favor mantle cell lymphoma but correlation  with cytogenetic and FISH studies is recommended.  MICROSCOPIC DESCRIPTION:  PERIPHERAL BLOOD SMEAR: The red blood cells display  prominent anisocytosis with macrocytic and normocytic cells.  There is mild to moderate poikilocytosis with teardrop cells, elliptocytes.  There is moderate polychromasia.  The white blood cells are increased in number with lymphocytosis characterized by predominance of small to medium sized lymphoid cells displaying high nuclear cytoplasmic ratio, round to irregular nuclei, coarse chromatin and small to inconspicuous nucleoli. The platelets are normal in number.     RADIOGRAPHIC STUDIES: I have personally reviewed the radiological images as listed and agreed with the findings in the report. No results found.  ASSESSMENT & PLAN:   82 y.o. woman with:   1.  CD20+ CD5+ve lymphoproliferative disorder presented with hemolytic anemia and splenomegaly in December 2021. Though discussed with patient that FISH was neg and so Mantle cell lymphoma not confirm. Findings more consistent with CLL.  2.  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  PLAN: -Discussed lab results from today, 03/14/2023, in detail with the patient. CBC shows slightly low platelets of 142 K. CMP shows elevated BUN of 33, elevated creatinine of 1.43, and elevated calcium level of 10.6.  -no major signs of red cell breaskage or anemia at this time -continue to hold off ibrutinitb and continue to monitor -no signs of infection at this time -no evidence of CLL/SLL progression at this time. -will continue to monitor with labs in 2-3 months -Continue to follow-up with Pulmonologist and Dermatologist.   FOLLOW-UP: RTC with Dr Candise Che with labs in 4 months  The total time spent in the appointment was 21 minutes* .  All of the patient's questions were answered with apparent satisfaction. The patient knows to call the clinic with any problems, questions or concerns.   Wyvonnia Lora MD MS AAHIVMS [facility] Hematology/Oncology Physician [facility]  .*Total  Encounter Time as defined by the Centers for Medicare and Medicaid Services includes, in addition to the face-to-face time of a patient visit (documented in the note above) non-face-to-face time: obtaining and reviewing outside history, ordering and reviewing medications, tests or procedures, care coordination (communications with other health care professionals or caregivers) and documentation in the medical record.   I,Param Shah,acting as a Neurosurgeon for Wyvonnia Lora, MD.,have documented all relevant documentation on the behalf of Wyvonnia Lora, MD,as directed by  Wyvonnia Lora, MD while in the presence of Wyvonnia Lora, MD.  .I have reviewed the above documentation for accuracy and completeness, and I agree with the above. Johney Maine MD

## 2023-03-16 ENCOUNTER — Ambulatory Visit: Payer: Medicare Other | Admitting: Internal Medicine

## 2023-03-20 DIAGNOSIS — Z1231 Encounter for screening mammogram for malignant neoplasm of breast: Secondary | ICD-10-CM | POA: Diagnosis not present

## 2023-03-21 ENCOUNTER — Ambulatory Visit (HOSPITAL_BASED_OUTPATIENT_CLINIC_OR_DEPARTMENT_OTHER): Payer: Medicare Other

## 2023-03-21 DIAGNOSIS — R0609 Other forms of dyspnea: Secondary | ICD-10-CM

## 2023-03-22 DIAGNOSIS — L408 Other psoriasis: Secondary | ICD-10-CM | POA: Diagnosis not present

## 2023-03-22 DIAGNOSIS — L218 Other seborrheic dermatitis: Secondary | ICD-10-CM | POA: Diagnosis not present

## 2023-03-22 LAB — ECHOCARDIOGRAM COMPLETE
Area-P 1/2: 3.99 cm2
S' Lateral: 3.49 cm

## 2023-04-13 ENCOUNTER — Ambulatory Visit: Payer: Medicare Other | Admitting: Internal Medicine

## 2023-04-20 ENCOUNTER — Ambulatory Visit: Payer: Self-pay

## 2023-04-20 NOTE — Patient Outreach (Signed)
  Care Coordination   04/20/2023 Name: Jacqueline Hancock MRN: [id number] DOB: Jan 10, 1941   Care Coordination Outreach Attempts:  An unsuccessful outreach was attempted for an appointment today.  Follow Up Plan:  Additional outreach attempts will be made to offer the patient complex care management information and services.   Encounter Outcome:  No Answer   Care Coordination Interventions:  No, not indicated    Kalaya Infantino Idelle Jo, RN, MSN [facility], [facility] RN Care Manager Direct Dial: [phone]  Fax: [phone] Website: [email or web address]

## 2023-04-27 ENCOUNTER — Telehealth: Payer: Self-pay

## 2023-04-27 NOTE — Patient Instructions (Signed)
Visit Information  Thank you for taking time to visit with me today. Please don't hesitate to contact me if I can be of assistance to you.   Following are the goals we discussed today:   Goals Addressed             This Visit's Progress    Health Maintenance-HTN       Patient Goals/Self Care Activities: -Patient/Caregiver will self-administer medications as prescribed as evidenced by self-report/primary caregiver report  -Patient/Caregiver will attend all scheduled provider appointments as evidenced by clinician review of documented attendance to scheduled appointments and patient/caregiver report -Patient/Caregiver will call provider office for new concerns or questions as evidenced by review of documented incoming telephone call notes and patient report  -Calls provider office for new concerns, questions, or BP outside discussed parameters -Checks BP and records as discussed -Follows a low sodium diet/DASH diet   Patient reports doing good.  She has been working to get an eye doctor appointment as her eye doctor retired.  Advised in local eye physicians. She states she will try possible [facility] where she gets her glasses from.  She reports her blood pressure is doing good. She did not provide numbers.  Reiterated HTN Management and low salt diet.  She verbalized understanding.  No concerns.            Our next appointment is by telephone on 06/13/23 at 1200 pm  Please call the care guide team at [phone] if you need to cancel or reschedule your appointment.   If you are experiencing a Mental Health or Behavioral Health Crisis or need someone to talk to, please call the Suicide and Crisis Lifeline: 988   The patient verbalized understanding of instructions, educational materials, and care plan provided today and DECLINED offer to receive copy of patient instructions, educational materials, and care plan.   The patient has been provided with contact information for the care  management team and has been advised to call with any health related questions or concerns.   Bary Leriche RN, MSN [facility], [facility] RN Care Manager Direct Dial: [phone]  Fax: [phone] Website: [email or web address]

## 2023-04-27 NOTE — Patient Outreach (Signed)
  Care Coordination   Follow Up Visit Note   04/27/2023 Name: Jacqueline Hancock MRN: [id number] DOB: 1940-08-26  Jacqueline Hancock is a 83 y.o. year old female who sees Jacqueline Cowboy, MD for primary care. I spoke with  Jacqueline Hancock by phone today.  What matters to the patients health and wellness today?  Getting new eye doctor    Goals Addressed             This Visit's Progress    Health Maintenance-HTN       Patient Goals/Self Care Activities: -Patient/Caregiver will self-administer medications as prescribed as evidenced by self-report/primary caregiver report  -Patient/Caregiver will attend all scheduled provider appointments as evidenced by clinician review of documented attendance to scheduled appointments and patient/caregiver report -Patient/Caregiver will call provider office for new concerns or questions as evidenced by review of documented incoming telephone call notes and patient report  -Calls provider office for new concerns, questions, or BP outside discussed parameters -Checks BP and records as discussed -Follows a low sodium diet/DASH diet   Patient reports doing good.  She has been working to get an eye doctor appointment as her eye doctor retired.  Advised in local eye physicians. She states she will try [facility] where she gets her glasses from.  She reports her blood pressure is doing good. She did not provide numbers.  Reiterated HTN Management and low salt diet.  She verbalized understanding.  No concerns.            SDOH assessments and interventions completed:  Yes  SDOH Interventions Today    Flowsheet Row Most Recent Value  SDOH Interventions   Transportation Interventions Intervention Not Indicated, Patient Resources (Friends/Family)  Utilities Interventions Intervention Not Indicated        Care Coordination Interventions:  Yes, provided   Follow up plan: Follow up call scheduled for March    Encounter Outcome:  Patient Visit Completed    Bary Leriche RN, MSN [facility], [facility] RN Care Manager Direct Dial: [phone]  Fax: [phone] Website: [email or web address]

## 2023-06-02 ENCOUNTER — Ambulatory Visit (HOSPITAL_COMMUNITY)
Admission: EM | Admit: 2023-06-02 | Discharge: 2023-06-02 | Disposition: A | Discharge status: Home / Self Care | Payer: Medicare Other | Attending: Family Medicine | Admitting: Family Medicine

## 2023-06-02 ENCOUNTER — Ambulatory Visit (INDEPENDENT_AMBULATORY_CARE_PROVIDER_SITE_OTHER): Payer: Medicare Other

## 2023-06-02 ENCOUNTER — Encounter (HOSPITAL_COMMUNITY): Payer: Self-pay

## 2023-06-02 DIAGNOSIS — R0781 Pleurodynia: Secondary | ICD-10-CM | POA: Diagnosis not present

## 2023-06-02 DIAGNOSIS — R519 Headache, unspecified: Secondary | ICD-10-CM

## 2023-06-02 DIAGNOSIS — R61 Generalized hyperhidrosis: Secondary | ICD-10-CM

## 2023-06-02 HISTORY — DX: Non-Hodgkin lymphoma, unspecified, unspecified site: C85.90

## 2023-06-02 LAB — POC COVID19/FLU A&B COMBO
Covid Antigen, POC: NEGATIVE
Influenza A Antigen, POC: NEGATIVE
Influenza B Antigen, POC: NEGATIVE

## 2023-06-02 MED ORDER — TRAMADOL HCL 50 MG PO TABS: 50.0000 mg | ORAL_TABLET | Freq: Four times a day (QID) | ORAL | 0 refills | Status: DC | PRN

## 2023-06-02 NOTE — Discharge Instructions (Signed)
 The test for flu and COVID was negative  By my review, there is no abnormality of the ribs or that part of your lungs where you are hurting.  The radiologist will also read your x-ray, and if their interpretation differs significantly from mine, we will call you.  We have drawn blood to check your blood counts and kidney and liver function numbers.  If anything is significantly abnormal we will call you.  Take tramadol 50 mg-- 1 tablet every 6 hours as needed for pain.  This medication can make you sleepy or dizzy  If your pain intensifies or you start having other serious symptoms, please consider going to the emergency room for further evaluation.  Please follow-up with your primary care and your oncologist about these issues.

## 2023-06-02 NOTE — ED Provider Notes (Addendum)
 [facility]    CSN: [id number] Arrival date & time: 06/02/23  0940      History   Chief Complaint Chief Complaint  Patient presents with   Flank Pain   Headache    HPI Jacqueline Hancock is a 83 y.o. female.    Flank Pain Associated symptoms include headaches.  Headache Here for pain in her right side, going on for about 2 weeks.  It radiates up toward her axilla and around to her right periumbilical area.  She does feel that leaning to her left makes the pain in her right side a little worse.  She does feel like it is felt worse in the last few days.  No trauma or fall.  She has not documented a fever but states she has had some sweats at night.  No nausea or vomiting or diarrhea but her stomach is a little upset.  Last bowel movement was this morning and normal.  No blood in the stool.  Appetite is a little decreased.  No abdominal cramping.  No dysuria or hematuria.  Now she has had a headache in the last 2 days.  No congestion or cough.  She is allergic to Nexium.  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  She is no longer on Imbruvica.  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      Home Medications    Prior to Admission medications   Medication Sig Start Date End Date Taking? Authorizing Provider  B Complex Vitamins (VITAMIN B-COMPLEX PO) Take by mouth.   Yes [provider]  calcium carbonate (OSCAL) 1500 (600 Ca) MG TABS tablet Take 500 mg by mouth daily.   Yes [provider]  Cholecalciferol (VITAMIN D) 2000 UNITS tablet Take 4,000 Units by mouth daily.    Yes [provider]  Ferrous Sulfate (IRON) 325 (65 Fe) MG TABS Take 325 mg by mouth daily.    Yes [provider]  guaiFENesin (ROBITUSSIN CHEST CONGESTION PO) Take by mouth.   Yes [provider]  losartan (COZAAR) 25 MG tablet Take 1 tablet (25 mg total) by mouth daily. 12/27/22 12/27/23 Yes [name],  NP  nadolol (CORGARD) 20 MG tablet TAKE 1 TABLET BY MOUTH DAILY FOR BLOOD PRESSURE OR TREMORS 07/22/22  Yes [name], NP  traMADol (ULTRAM) 50 MG tablet Take 1 tablet (50 mg total) by mouth every 6 (six) hours as needed (pain). 06/02/23  Yes Zenia Resides, MD  Fluocinolone Acetonide Scalp 0.01 % OIL Apply topically. 01/19/23   [provider]  fluticasone (CUTIVATE) 0.05 % cream Apply 1 Application topically daily. 01/19/23   [provider]  fluticasone (FLONASE) 50 MCG/ACT nasal spray Place 2 sprays into both nostrils daily. 04/12/21   Zenia Resides, MD  ketoconazole (NIZORAL) 2 % cream Apply topically 2 (two) times daily. 01/19/23   [provider]  ketoconazole (NIZORAL) 2 % shampoo Apply topically. 01/19/23   [provider]   montelukast (SINGULAIR) 10 MG tablet Take 1 tablet (10 mg total) by mouth daily. 08/16/22 08/16/23  [name], NP  tobramycin-dexamethasone [facility]) ophthalmic solution 1 drop 4 (four) times daily.    [provider]  gabapentin (NEURONTIN) 100 MG capsule Take 1 capsule 3 x /day for Neuropathy Pain Patient not taking: Reported on 01/25/2020 08/02/19 01/25/20  Lucky Cowboy, MD    Family History Family History  Problem Relation Age of Onset   Leukemia Mother    Hypertension Father    Diabetes Sister    Heart disease Brother    Diabetes Brother    Diabetes Brother     Social History Social History   Tobacco Use   Smoking status: Never   Smokeless tobacco: Never  Vaping Use   Vaping status: Never Used  Substance Use Topics   Alcohol use: No   Drug use: Never     Allergies   Nexium [esomeprazole magnesium]   Review of Systems Review of Systems  Genitourinary:  Positive for flank pain.  Neurological:  Positive for headaches.     Physical Exam Triage Vital Signs ED Triage Vitals  Encounter Vitals Group     BP 06/02/23 1105 139/82     Systolic BP Percentile --      Diastolic BP Percentile --      Pulse Rate 06/02/23 1105 64     Resp 06/02/23 1105 18     Temp 06/02/23 1105 98.6 F (37 C)     Temp Source 06/02/23 1105 Oral     SpO2 06/02/23 1105 98 %     Weight --      Height --      Head Circumference --      Peak Flow --      Pain Score 06/02/23 1059 5     Pain Loc --      Pain Education --      Exclude from Growth Chart --    No data found.  Updated Vital Signs BP 139/82 (BP Location: Right Arm)   Pulse 64   Temp 98.6 F (37 C) (Oral)   Resp 18   SpO2 98%   Visual Acuity Right Eye Distance:   Left Eye Distance:   Bilateral Distance:    Right Eye Near:   Left Eye Near:    Bilateral Near:     Physical Exam Vitals reviewed.  Constitutional:      General: She is not in acute distress.    Appearance: She is not  toxic-appearing.  HENT:     Right Ear: Tympanic membrane and ear canal normal.     Left Ear: Tympanic membrane and ear canal  normal.     Nose: Nose normal.     Mouth/Throat:     Mouth: Mucous membranes are moist.     Pharynx: No oropharyngeal exudate or posterior oropharyngeal erythema.  Eyes:     Extraocular Movements: Extraocular movements intact.     Conjunctiva/sclera: Conjunctivae normal.     Pupils: Pupils are equal, round, and reactive to light.  Cardiovascular:     Rate and Rhythm: Normal rate and regular rhythm.     Heart sounds: No murmur heard. Pulmonary:     Effort: Pulmonary effort is normal. No respiratory distress.     Breath sounds: No stridor. No wheezing, rhonchi or rales.     Comments: She is possibly a little tender in her mid axillary line on the right lower rib cage.  No rash or erythema or deformity. Abdominal:     General: There is no distension.     Palpations: Abdomen is soft.     Tenderness: There is no abdominal tenderness. There is no right CVA tenderness, left CVA tenderness or guarding.  Musculoskeletal:     Cervical back: Neck supple.  Lymphadenopathy:     Cervical: No cervical adenopathy.  Skin:    Capillary Refill: Capillary refill takes less than 2 seconds.     Coloration: Skin is not jaundiced or pale.  Neurological:     General: No focal deficit present.     Mental Status: She is alert and oriented to person, place, and time.  Psychiatric:        Behavior: Behavior normal.      UC Treatments / Results  Labs (all labs ordered are listed, but only abnormal results are displayed) Labs Reviewed  POC COVID19/FLU A&B COMBO - Normal  CBC WITH DIFFERENTIAL/PLATELET  COMPREHENSIVE METABOLIC PANEL    EKG   Radiology DG Ribs Unilateral W/Chest Right Result Date: 06/02/2023 CLINICAL DATA:  Right axillary and posterior rib pain for the past 2 weeks. No injury. EXAM: RIGHT RIBS AND CHEST - 3+ VIEW COMPARISON:  CT chest dated December 02, 2022.  FINDINGS: No fracture or other bone lesions are seen involving the ribs. There is no evidence of pneumothorax or pleural effusion. Mild interstitial thickening at the lung bases has progressed since the prior x-ray. Heart size and mediastinal contours are within normal limits. IMPRESSION: 1. No rib fracture. 2. Mildly progressed chronic interstitial lung disease. Electronically Signed   By: Obie Dredge M.D.   On: 06/02/2023 13:52    Procedures Procedures (including critical care time)  Medications Ordered in UC Medications - No data to display  Initial Impression / Assessment and Plan / UC Course  I have reviewed the triage vital signs and the nursing notes.  Pertinent labs & imaging results that were available during my care of the patient were reviewed by me and considered in my medical decision making (see chart for details).     Test for flu and COVID is negative, done because of her recent onset of headache.  By my review the rib x-rays are normal, except she has some calcification in the cartilaginous portion of her ribs.  She is advised of radiology overread.  CBC and CMP are drawn today, since she has a history of lymphoma and some of her symptoms are concerning for possible exacerbation of that illness.  If she worsens in any way she is to go to the emergency room for further evaluation.   Staff were unable to obtain blood for the blood test that were  ordered. Final Clinical Impressions(s) / UC Diagnoses   Final diagnoses:  Rib pain on right side  Nonintractable headache, unspecified chronicity pattern, unspecified headache type  Night sweats     Discharge Instructions      The test for flu and COVID was negative  By my review, there is no abnormality of the ribs or that part of your lungs where you are hurting.  The radiologist will also read your x-ray, and if their interpretation differs significantly from mine, we will call you.  We have drawn blood to check  your blood counts and kidney and liver function numbers.  If anything is significantly abnormal we will call you.  Take tramadol 50 mg-- 1 tablet every 6 hours as needed for pain.  This medication can make you sleepy or dizzy  If your pain intensifies or you start having other serious symptoms, please consider going to the emergency room for further evaluation.  Please follow-up with your primary care and your oncologist about these issues.        ED Prescriptions     Medication Sig Dispense Auth. Provider   traMADol (ULTRAM) 50 MG tablet Take 1 tablet (50 mg total) by mouth every 6 (six) hours as needed (pain). 12 tablet Jadeyn Hargett, Janace Aris, MD      I have reviewed the PDMP during this encounter.   Zenia Resides, MD 06/02/23 1243    Zenia Resides, MD 06/02/23 [id number]

## 2023-06-02 NOTE — ED Triage Notes (Signed)
 Pt c/o of right side/flank pain that has been increasing over time. The pain will sometimes go into her right leg and her right foot will start shaking.   She has also been having sweats and right sided headache for three days.   Start date: About two weeks ago  Home interventions: None

## 2023-06-05 ENCOUNTER — Encounter: Payer: Self-pay | Admitting: Internal Medicine

## 2023-06-05 ENCOUNTER — Ambulatory Visit: Payer: Medicare Other | Admitting: Internal Medicine

## 2023-06-05 VITALS — BP 158/80 | HR 67 | Temp 97.8°F | Ht 59.0 in | Wt 155.0 lb

## 2023-06-05 DIAGNOSIS — J849 Interstitial pulmonary disease, unspecified: Secondary | ICD-10-CM | POA: Diagnosis not present

## 2023-06-05 DIAGNOSIS — D8989 Other specified disorders involving the immune mechanism, not elsewhere classified: Secondary | ICD-10-CM | POA: Diagnosis not present

## 2023-06-05 DIAGNOSIS — R1011 Right upper quadrant pain: Secondary | ICD-10-CM | POA: Diagnosis not present

## 2023-06-05 DIAGNOSIS — R768 Other specified abnormal immunological findings in serum: Secondary | ICD-10-CM

## 2023-06-05 NOTE — Patient Instructions (Addendum)
 ICD-10-CM   1. ILD (interstitial lung disease) (HCC)  J84.9     2. Positive double stranded DNA antibody test  R76.8     3. 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 -The cause of this could be ibrutinib versus autoimmune [your autoimmune antibodies for lupus were positive but only had a trace level].    PLAN -Given mild burden and autoimmune antibody positive, holding off on any biopsy or treatment consideration with antifibrotic's at this point.  -Definitely treat with antifibrotic's if there is progression -Refer rheumatology [they might need some additional blood work] -If Dr Candise Che wants to continue to hold ibrutinib that would be great -Do spirometry and DLCO in 6 months - Do  high-resolution CT chest in 6 months -For the abdominal pain please visit with your primary care doctor  Follow-up - Return in 6 months face-to-face visit with Dr. Marchelle Gearing 15-minute visit but after completing CT scan and breathing test

## 2023-06-05 NOTE — Progress Notes (Signed)
 OV 02/07/2023  Subjective:  Patient ID: Jacqueline Hancock, female , DOB: 07-18-1940 , age 83 y.o. , MRN: [id number] , ADDRESS: [address] [place] [address] PCP Lucky Cowboy, MD Patient Care Team: Lucky Cowboy, MD as PCP - General (Internal Medicine) Flo Shanks, MD as Consulting Physician (Otolaryngology) Sharrell Ku, MD as Consulting Physician (Gastroenterology) Pennie Rushing Maris Berger, MD (Inactive) as Consulting Physician (Obstetrics and Gynecology) Jethro Bolus, MD as Consulting Physician (Ophthalmology) Charlett Nose, [facility] (Inactive) as Pharmacist (Pharmacist) Fleeta Emmer, RN as [facility] Care Management  This Provider for this visit: Treatment Team:  Attending Provider: Kalman Shan, MD    02/07/2023 -   Chief Complaint  Patient presents with   Consult    Ct 8/30, denies sob, does occ cough     HPI Jacqueline Hancock 83 y.o. -new consult referred by Dr. Wyvonnia Lora and oncology.  History is provided by talking to her and also review of the medical records.  Both her history and records correlate well.  She has chronic lymphocytic leukemia.  She also has hemolytic anemia status post bone marrow biopsy completed December 2021.  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  At that time this is CT scan of the chest that I personally visualized and the lung fields are clear.   She was started on ibrutinib in January 2022 and by the summer 2023 the dose was reduced.  During her visit on September 12, 2022 she complained of skin rashes and itchiness throughout the body [ibrutinib can cause greater than 13% itchiness] there is also scaliness.  At this point in time she was having some mild cough.  A CT scan of the chest for that visit done in May 2024 did show some groundglass opacities.  The consensus was that she had  toxicity due to ibrutinib she was given prednisone.  Ibrutinib was held.  Then when she followed up in October 14, 2022 the rash had resolved the itchiness was still there but it did improve.  She is still having some cough.  She was given prednisone which she completed by this visit.  The prednisone did help improve the rash.  Plan was to get CT scan of the chest and she followed up mid September 2024.  At this point in time she had been off ibrutinib for 4 months.  Itchiness had improve significantly.  She did have a CT scan of the chest August 2024 that showed progress in groundglass opacities [present visualized and confirmed] and therefore she has been referred here.  At this time of visit she says she is back to baseline health there is no itching.  The cough is very mild.  Increases with perfumes and this little wheezing.  These are chronic issues.  Shortness of breath is also chronic.  The itchiness is completely resolved she states of ibrutinib.  She does note that she is lives in 83 year old house and that might be mold in it  She is here at the request of oncology and her current symptoms are below    Her exercise hypoxemia test is normal.        OV 06/05/2023  Subjective:  Patient ID: Jacqueline Hancock, female , DOB: 12-Jul-1940 , age 83 y.o. , MRN: [id number] , ADDRESS: [address] [place] [address] PCP Lucky Cowboy, MD Patient Care Team:  Lucky Cowboy, MD as PCP - General (Internal Medicine) Flo Shanks, MD as Consulting Physician (Otolaryngology) Sharrell Ku, MD as Consulting Physician (Gastroenterology) Pennie Rushing Maris Berger, MD (Inactive) as Consulting Physician (Obstetrics and Gynecology) Jethro Bolus, MD as Consulting Physician (Ophthalmology) Charlett Nose, [facility] (Inactive) as Pharmacist (Pharmacist) Fleeta Emmer, RN as [facility] Care Management  This Provider for this visit: Treatment Team:  Attending Provider: Kalman Shan,  MD    06/05/2023 -   Chief Complaint  Patient presents with   Follow-up     HPI Jacqueline Hancock 83 y.o. -ILD follow-up.  Discovered in the setting of ibrutinib treatment.  No was been on help hold  [facility] Integrated Comprehensive ILD Questionnaire  Symptoms:   Ibrutinib continues to be on hold.  Reviewed with Dr. Candise Che notes and so far because it is in remission he has it on hold.  Also noted that she has autoimmune hemolytic anemia.  She tells me since her last visit she continues to be stable except that in the last 2 weeks she has had some right-sided infractured pain and right flank pain.  She went to the ED.  They did an x-ray could not get blood work.  Discharged home.  Her primary care physician died so she has a new primary care physician that she is going to plug into.  But otherwise no other changes to her health status.  She is not on any biologic.  Not on any chemo checkpoint inhibitor.  Not on any prednisone.  She did do the ILD questionnaire and bring it with her.  SYMPTOM SCALE - ILD 02/07/2023 06/05/2023   Current weight    O2 use ra ra  Shortness of Breath 0 -> 5 scale with 5 being worst (score 6 If unable to do)   At rest 0 4  Simple tasks - showers, clothes change, eating, shaving 0 0  Household (dishes, doing bed, laundry) 0 0  Shopping 0 0  Walking level at own pace 0 0  Walking up Stairs 4 2  Total (30-36) Dyspnea Score 4 6  How bad is your cough? 2 3  How bad is your fatigue 2 2  How bad is nausea 00 0  How bad is vomiting?  0 0  How bad is diarrhea? 0 0  How bad is anxiety? 0 0  How bad is depression 0 0  Any chronic pain - if so where and how bad 0 3- Rt infrazaillary ad Rt Flank pain new     FAMILY HISTORY of LUNG DISEASE:  Denies any family history of lung disease  PERSONAL EXPOSURE HISTORY:  Denies any smoking.  Denies any drug use.  Denies any marijuana denies any cocaine  HOME  EXPOSURE and HOBBY DETAILS :  -Her current home is [address] years old.   She is lived there for 57 years.  Despite this being a old single-family home she categorically denies any detail organic antigen exposure history is negative.  OCCUPATIONAL HISTORY (122 questions) : She is retired.  She worked at [employer].  Detail organic and inorganic antigen history is negative.  PULMONARY TOXICITY HISTORY (27 items):  Was on checkpoint inhibitor ibrutinib  INVESTIGATIONS: Trace positive ANA Trace positive double-stranded DNA.     Simple office walk 224 (66+46 x 2) feet Pod A at [facility] x  3 laps goal with forehead probe 02/07/2023  06/05/2023   O2 used ra ra  Number laps completed 15 sit anda 15 sti and  stand  Comments about pace good   Resting Pulse Ox/HR 99% and 65/min 100% and HR 73  Final Pulse Ox/HR 99% and 75/min 97% and HR 73  Desaturated </= 88% no   Desaturated <= 3% points no   Got Tachycardic >/= 90/min no   Symptoms at end of test non   Miscellaneous comments norma       CT Chest data from date:  AUg 2024  - personally visualized and independently interpreted : yes - my findings are: Groundglass opacities and not probable UIP Narrative & Impression  CLINICAL DATA:  83 year old female with history of CLL. Evaluate for interstitial lung disease. * Tracking Code: BO *   EXAM: CT CHEST WITHOUT CONTRAST   TECHNIQUE: Multidetector CT imaging of the chest was performed following the standard protocol without intravenous contrast. High resolution imaging of the lungs, as well as inspiratory and expiratory imaging, was performed.   RADIATION DOSE REDUCTION: This exam was performed according to the departmental dose-optimization program which includes automated exposure control, adjustment of the mA and/or kV according to patient size and/or use of iterative reconstruction technique.   COMPARISON:  No priors.   FINDINGS: Cardiovascular: Heart size is normal. There is no significant pericardial fluid, thickening or  pericardial calcification. There is aortic atherosclerosis, as well as atherosclerosis of the great vessels of the mediastinum and the coronary arteries, including calcified atherosclerotic plaque in the left circumflex coronary artery.   Mediastinum/Nodes: No pathologically enlarged mediastinal or hilar lymph nodes. Please note that accurate exclusion of hilar adenopathy is limited on noncontrast CT scans. Small hiatal hernia. No axillary lymphadenopathy.   Lungs/Pleura: High-resolution images demonstrate scattered areas of ground-glass attenuation, septal thickening, thickening of the peribronchovascular interstitium and some mild subpleural reticulation, predominantly in the mid to lower lungs bilaterally. Minimal traction bronchiectasis is also noted. No frank honeycombing. Findings appear only mildly progressive compared to prior examination from 2021. Inspiratory and expiratory imaging is limited by patient respiratory motion, essentially nondiagnostic. No confluent consolidative airspace disease. No pleural effusions. No definite suspicious appearing pulmonary nodules or masses are noted.   Upper Abdomen: 5 mm nonobstructive calculus in the left renal collecting system. Aortic atherosclerosis.   Musculoskeletal: There are no aggressive appearing lytic or blastic lesions noted in the visualized portions of the skeleton.   IMPRESSION: 1. 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 Repeat high-resolution chest CT should be considered in 12 months to assess for temporal changes in the appearance of the lung parenchyma. 2. Aortic atherosclerosis, in addition to left circumflex coronary artery disease. 3. 5 mm nonobstructive calculus in the left renal collecting system.   Aortic Atherosclerosis (ICD10-I70.0).     Electronically Signed   By: Trudie Boule M.D.    On: 12/11/2022 11:50       PFT     Latest Ref Rng & Units 02/24/2023    9:47 AM  PFT Results  FVC-Pre L 1.28   FVC-Predicted Pre % 66   Pre FEV1/FVC % % 79   FEV1-Pre L 1.01   FEV1-Predicted Pre % 71   DLCO uncorrected ml/min/mmHg 12.05   DLCO UNC% % 77   DLCO corrected ml/min/mmHg 11.84   DLCO COR %Predicted % 75   DLVA Predicted % 127       Latest Reference Range & Units 02/21/23 12:06  ANA Titer 1  Negative  Cyclic Citrullin Peptide Ab UNITS <16  dsDNA Ab  0 - 9 IU/mL 12 (H)  ENA RNP Ab 0.0 - 0.9 AI 3.0 (H)  ENA SSA (RO) Ab 0.0 - 0.9 AI <0.2  ENA SSB (LA) Ab 0.0 - 0.9 AI <0.2  RA Latex Turbid. <14 IU/mL <10  ENA SM Ab Ser-aCnc 0.0 - 0.9 AI <0.2  SSA (Ro) (ENA) Antibody, IgG <1.0 NEG AI <1.0 NEG  SSB (La) (ENA) Antibody, IgG <1.0 NEG AI <1.0 NEG  Scleroderma (Scl-70) (ENA) Antibody, IgG 0.0 - 0.9 AI <1.0 NEG AI <0.2 <1.0 NEG  (H): Data is abnormally high  LAB RESULTS last 96 hours DG Ribs Unilateral W/Chest Right Result Date: 06/02/2023 CLINICAL DATA:  Right axillary and posterior rib pain for the past 2 weeks. No injury. EXAM: RIGHT RIBS AND CHEST - 3+ VIEW COMPARISON:  CT chest dated December 02, 2022. FINDINGS: No fracture or other bone lesions are seen involving the ribs. There is no evidence of pneumothorax or pleural effusion. Mild interstitial thickening at the lung bases has progressed since the prior x-ray. Heart size and mediastinal contours are within normal limits. IMPRESSION: 1. No rib fracture. 2. Mildly progressed chronic interstitial lung disease. Electronically Signed   By: Obie Dredge M.D.   On: 06/02/2023 13:52     ECHO DEC 2024 IMPRESSIONS     1. Left ventricular ejection fraction, by estimation, is 60 to 65%. Left  ventricular ejection fraction by 3D volume is 61 %. The left ventricle has  normal function. The left ventricle has no regional wall motion  abnormalities. Left ventricular diastolic   parameters are consistent with Grade I  diastolic dysfunction (impaired  relaxation).   2. Right ventricular systolic function is normal. The right ventricular  size is normal. There is normal pulmonary artery systolic pressure. The  estimated right ventricular systolic pressure is 22.4 mmHg.   3. The mitral valve is degenerative. Mild, eccentric mitral valve  regurgitation, may be underestimated by color Doppler. No evidence of  mitral stenosis.   4. The aortic valve is grossly normal. There is mild calcification of the  aortic valve. Aortic valve regurgitation is trivial. No aortic stenosis is  present.   5. The inferior vena cava is normal in size with greater than 50%  respiratory variability, suggesting right atrial pressure of 3 mmHg.        has a past medical history of Dyspnea (2021), Early cataracts, bilateral, Elevated hemoglobin A1c, Fibroids, GERD (gastroesophageal reflux disease), Hyperlipidemia, Hypertension, Lymphoma (HCC), and Osteopenia.   reports that she has never smoked. She has never used smokeless tobacco.  Past Surgical History:  Procedure Laterality Date   ABDOMINAL HYSTERECTOMY     APPENDECTOMY     CATARACT EXTRACTION, BILATERAL Bilateral 09/2017   Dr. 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   TONSILLECTOMY AND ADENOIDECTOMY      Allergies  Allergen Reactions   Nexium [Esomeprazole Magnesium] Other (See Comments)    Headache    Immunization History  Administered Date(s) Administered   Influenza, High Dose Seasonal PF 01/28/2015, 12/11/2015, 01/25/2017, 01/22/2018, 01/10/2019, 02/03/2019, 02/05/2020, 02/03/2021, 02/03/2022, 12/27/2022   Influenza-Unspecified 01/29/2013, 12/27/2013, 01/25/2017   PFIZER(Purple Top)SARS-COV-2 Vaccination 05/11/2019, 06/03/2019   PPD Test 02/25/2013   Pfizer Covid-19 Vaccine Bivalent Booster 52yrs & up 01/20/2022   Pneumococcal Conjugate-13 01/24/2017   Pneumococcal Polysaccharide-23 02/25/2013   Td 09/13/2001, 02/20/2012   Tdap 02/25/2013    Zoster, Live 10/23/2006    Family History  Problem Relation Age of Onset   Leukemia  Mother    Hypertension Father    Diabetes Sister    Heart disease Brother    Diabetes Brother    Diabetes Brother      Current Outpatient Medications:    B Complex Vitamins (VITAMIN B-COMPLEX PO), Take by mouth., Disp: , Rfl:    calcium carbonate (OSCAL) 1500 (600 Ca) MG TABS tablet, Take 500 mg by mouth daily., Disp: , Rfl:    Cholecalciferol (VITAMIN D) 2000 UNITS tablet, Take 4,000 Units by mouth daily. , Disp: , Rfl:    Ferrous Sulfate (IRON) 325 (65 Fe) MG TABS, Take 325 mg by mouth daily. , Disp: , Rfl:    Fluocinolone Acetonide Scalp 0.01 % OIL, Apply topically., Disp: , Rfl:    fluticasone (CUTIVATE) 0.05 % cream, Apply 1 Application topically daily., Disp: , Rfl:    fluticasone (FLONASE) 50 MCG/ACT nasal spray, Place 2 sprays into both nostrils daily., Disp: 16 g, Rfl: 2   guaiFENesin (ROBITUSSIN CHEST CONGESTION PO), Take by mouth., Disp: , Rfl:    ketoconazole (NIZORAL) 2 % cream, Apply topically 2 (two) times daily., Disp: , Rfl:    ketoconazole (NIZORAL) 2 % shampoo, Apply topically., Disp: , Rfl:    losartan (COZAAR) 25 MG tablet, Take 1 tablet (25 mg total) by mouth daily., Disp: , Rfl:    montelukast (SINGULAIR) 10 MG tablet, Take 1 tablet (10 mg total) by mouth daily., Disp: 90 tablet, Rfl: 2   nadolol (CORGARD) 20 MG tablet, TAKE 1 TABLET BY MOUTH DAILY FOR BLOOD PRESSURE OR TREMORS, Disp: 90 tablet, Rfl: 3   tobramycin-dexamethasone (TOBRADEX) ophthalmic solution, 1 drop 4 (four) times daily., Disp: , Rfl:    traMADol (ULTRAM) 50 MG tablet, Take 1 tablet (50 mg total) by mouth every 6 (six) hours as needed (pain)., Disp: 12 tablet, Rfl: 0      Objective:   Vitals:   06/05/23 1353  BP: (!) 158/80  Pulse: 67  Temp: 97.8 F (36.6 C)  TempSrc: Oral  SpO2: 98%  Weight: 155 lb (70.3 kg)  Height: 4\' 11"  (1.499 m)    Estimated body mass index is 31.31 kg/m as calculated  from the following:   Height as of this encounter: 4\' 11"  (1.499 m).   Weight as of this encounter: 155 lb (70.3 kg).  @WEIGHTCHANGE @  [employer]   06/05/23 1353  Weight: 155 lb (70.3 kg)     Physical Exam   General: No distress. Looks well O2 at rest: no Cane present: no Sitting in wheel chair: no Frail: no Obese: no Neuro: Alert and Oriented x 3. GCS 15. Speech normal ABD: Slight right lower quadrant subjective tenderness during palpation but otherwise soft no rigidity no rebound. Psych: Pleasant Resp:  Barrel Chest - no.  Wheeze - no, Crackles - no, No overt respiratory distress CVS: Normal heart sounds. Murmurs - no Ext: Stigmata of Connective Tissue Disease - NO HEENT: Normal upper airway. PEERL +. No post nasal drip        Assessment:       ICD-10-CM   1. ILD (interstitial lung disease) (HCC)  J84.9 Ambulatory referral to Rheumatology    Pulmonary function test    CT Chest High Resolution    2. Positive double stranded DNA antibody test  R76.8 Ambulatory referral to Rheumatology    Pulmonary function test    CT Chest High Resolution    3. 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 Right upper quadrant abdominal pain  R10.11          Plan:     Patient Instructions     ICD-10-CM   1. ILD (interstitial lung disease) (HCC)  J84.9     2. Positive double stranded DNA antibody test  R76.8     3. 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 -The cause of this could be ibrutinib versus autoimmune [your autoimmune antibodies for lupus were positive but only had a trace level].    PLAN -Given mild burden and autoimmune antibody positive, holding off on any biopsy or  treatment consideration with antifibrotic's at this point.  -Definitely treat with antifibrotic's if there is progression -Refer rheumatology [they might need some additional blood work] -If Dr Candise Che wants to continue to hold ibrutinib that would be great -Do spirometry and DLCO in 6 months - Do  high-resolution CT chest in 6 months -For the abdominal pain please visit with your primary care doctor  Follow-up - Return in 6 months face-to-face visit with Dr. Marchelle Gearing 15-minute visit but after completing CT scan and breathing test   FOLLOWUP Return in about 6 months (around 12/06/2023) for 15 min visit, with Dr Marchelle Gearing, Face to Face Visit, after HRCT chest.  ( Level 05 visit E&M 2024: Estb >= 40 min n  visit type: on-site physical face to visit  in total care time and counseling or/and coordination of care by this undersigned MD - Dr Kalman Shan. This includes one or more of the following on this same day 06/05/2023: pre-charting, chart review, note writing, documentation discussion of test results, diagnostic or treatment recommendations, prognosis, risks and benefits of management options, instructions, education, compliance or risk-factor reduction. It excludes time spent by the CMA or office staff in the care of the patient. Actual time 40 min)   SIGNATURE    Dr. Kalman Shan, M.D., F.C.C.P,  Pulmonary and Critical Care Medicine Staff Physician, [facility] Director - Interstitial Lung Disease  Program  Pulmonary Fibrosis [facility] at [facility] [place], [place], [address]  Pager: [phone], If no answer or between  15:00h - 7:00h: call [phone] Telephone: [phone]  2:42 PM 06/05/2023

## 2023-06-09 ENCOUNTER — Telehealth: Payer: Self-pay

## 2023-06-09 NOTE — Patient Outreach (Signed)
 Care Coordination   Follow Up Visit Note   06/09/2023 Name: ELLANA KAWA MRN: [id number] DOB: 1940-07-14  ALISHBA NAPLES is a 83 y.o. year old female who sees Lucky Cowboy, MD for primary care. I spoke with  [name] by phone today.  What matters to the patients health and wellness today?  Maintaining health and seeing new PCP    Goals Addressed             This Visit's Progress    COMPLETED: Health Maintenance-HTN       Patient Goals/Self Care Activities: -Patient/Caregiver will self-administer medications as prescribed as evidenced by self-report/primary caregiver report  -Patient/Caregiver will attend all scheduled provider appointments as evidenced by clinician review of documented attendance to scheduled appointments and patient/caregiver report -Patient/Caregiver will call provider office for new concerns or questions as evidenced by review of documented incoming telephone call notes and patient report  -Calls provider office for new concerns, questions, or BP outside discussed parameters -Checks BP and records as discussed -Follows a low sodium diet/DASH diet   Patient reports doing good.  She reports she went to the urgent due to pain in her side.  She reports it is better. She sees her new PCP on 06/14/23.  Reviewed HTN Management and low salt diet.  She verbalized understanding.  Advised patient on closing case as she does well with health management.  She is agreeable to close case at this time. No concerns.            SDOH assessments and interventions completed:  Yes     Care Coordination Interventions:  Yes, provided   Follow up plan: No further intervention required.   Encounter Outcome:  Patient Visit Completed   Bary Leriche RN, MSN [facility], [facility] Health RN Care Manager Direct Dial: [phone]  Fax: [phone] Website: [email or web address]

## 2023-06-09 NOTE — Patient Instructions (Signed)
 Visit Information  Thank you for taking time to visit with me today. Please don't hesitate to contact me if I can be of assistance to you.   Following are the goals we discussed today:   Goals Addressed             This Visit's Progress    COMPLETED: Health Maintenance-HTN       Patient Goals/Self Care Activities: -Patient/Caregiver will self-administer medications as prescribed as evidenced by self-report/primary caregiver report  -Patient/Caregiver will attend all scheduled provider appointments as evidenced by clinician review of documented attendance to scheduled appointments and patient/caregiver report -Patient/Caregiver will call provider office for new concerns or questions as evidenced by review of documented incoming telephone call notes and patient report  -Calls provider office for new concerns, questions, or BP outside discussed parameters -Checks BP and records as discussed -Follows a low sodium diet/DASH diet   Patient reports doing good.  She reports she went to the urgent due to pain in her side.  She reports it is better. She sees her new PCP on 06/14/23.  Reviewed HTN Management and low salt diet.  She verbalized understanding.  Advised patient on closing case as she does well with health management.  She is agreeable to close case at this time. No concerns.             If you are experiencing a Mental Health or Behavioral Health Crisis or need someone to talk to, please call the Suicide and Crisis Lifeline: 988   The patient verbalized understanding of instructions, educational materials, and care plan provided today and DECLINED offer to receive copy of patient instructions, educational materials, and care plan.   The patient has been provided with contact information for the care management team and has been advised to call with any health related questions or concerns.   Bary Leriche RN, MSN [facility], [facility] RN  Care Manager Direct Dial: [phone]  Fax: [phone] Website: [email or web address]

## 2023-06-14 ENCOUNTER — Emergency Department (HOSPITAL_COMMUNITY)

## 2023-06-14 ENCOUNTER — Inpatient Hospital Stay (HOSPITAL_COMMUNITY)
Admission: EM | Admit: 2023-06-14 | Discharge: 2023-06-18 | DRG: 640 | Disposition: A | Discharge status: Home Health | Attending: Internal Medicine | Admitting: Internal Medicine

## 2023-06-14 ENCOUNTER — Ambulatory Visit (INDEPENDENT_AMBULATORY_CARE_PROVIDER_SITE_OTHER): Payer: Self-pay | Admitting: Family

## 2023-06-14 ENCOUNTER — Encounter: Payer: Self-pay | Admitting: Family

## 2023-06-14 ENCOUNTER — Encounter (HOSPITAL_COMMUNITY): Payer: Self-pay | Admitting: *Deleted

## 2023-06-14 ENCOUNTER — Other Ambulatory Visit: Payer: Self-pay

## 2023-06-14 VITALS — BP 138/60 | HR 60 | Temp 97.9°F | Resp 21 | Ht 59.0 in | Wt 158.0 lb

## 2023-06-14 DIAGNOSIS — D5919 Other autoimmune hemolytic anemia: Secondary | ICD-10-CM | POA: Diagnosis not present

## 2023-06-14 DIAGNOSIS — Y92019 Unspecified place in single-family (private) house as the place of occurrence of the external cause: Secondary | ICD-10-CM

## 2023-06-14 DIAGNOSIS — R2681 Unsteadiness on feet: Secondary | ICD-10-CM

## 2023-06-14 DIAGNOSIS — W19XXXA Unspecified fall, initial encounter: Secondary | ICD-10-CM | POA: Diagnosis present

## 2023-06-14 DIAGNOSIS — M25552 Pain in left hip: Secondary | ICD-10-CM | POA: Diagnosis present

## 2023-06-14 DIAGNOSIS — Z79899 Other long term (current) drug therapy: Secondary | ICD-10-CM

## 2023-06-14 DIAGNOSIS — E559 Vitamin D deficiency, unspecified: Secondary | ICD-10-CM

## 2023-06-14 DIAGNOSIS — N179 Acute kidney failure, unspecified: Secondary | ICD-10-CM | POA: Diagnosis present

## 2023-06-14 DIAGNOSIS — Z9181 History of falling: Secondary | ICD-10-CM

## 2023-06-14 DIAGNOSIS — Z833 Family history of diabetes mellitus: Secondary | ICD-10-CM

## 2023-06-14 DIAGNOSIS — R5381 Other malaise: Secondary | ICD-10-CM | POA: Diagnosis present

## 2023-06-14 DIAGNOSIS — Z8249 Family history of ischemic heart disease and other diseases of the circulatory system: Secondary | ICD-10-CM

## 2023-06-14 DIAGNOSIS — E782 Mixed hyperlipidemia: Secondary | ICD-10-CM

## 2023-06-14 DIAGNOSIS — K760 Fatty (change of) liver, not elsewhere classified: Secondary | ICD-10-CM

## 2023-06-14 DIAGNOSIS — C8310 Mantle cell lymphoma, unspecified site: Secondary | ICD-10-CM

## 2023-06-14 DIAGNOSIS — R112 Nausea with vomiting, unspecified: Secondary | ICD-10-CM | POA: Diagnosis not present

## 2023-06-14 DIAGNOSIS — R296 Repeated falls: Secondary | ICD-10-CM | POA: Diagnosis present

## 2023-06-14 DIAGNOSIS — N183 Chronic kidney disease, stage 3 unspecified: Secondary | ICD-10-CM

## 2023-06-14 DIAGNOSIS — K219 Gastro-esophageal reflux disease without esophagitis: Secondary | ICD-10-CM | POA: Diagnosis present

## 2023-06-14 DIAGNOSIS — S37031A Laceration of right kidney, unspecified degree, initial encounter: Secondary | ICD-10-CM

## 2023-06-14 DIAGNOSIS — Z8572 Personal history of non-Hodgkin lymphomas: Secondary | ICD-10-CM

## 2023-06-14 DIAGNOSIS — E785 Hyperlipidemia, unspecified: Secondary | ICD-10-CM | POA: Diagnosis present

## 2023-06-14 DIAGNOSIS — K5900 Constipation, unspecified: Secondary | ICD-10-CM | POA: Diagnosis not present

## 2023-06-14 DIAGNOSIS — I129 Hypertensive chronic kidney disease with stage 1 through stage 4 chronic kidney disease, or unspecified chronic kidney disease: Secondary | ICD-10-CM | POA: Diagnosis present

## 2023-06-14 DIAGNOSIS — Z90710 Acquired absence of both cervix and uterus: Secondary | ICD-10-CM

## 2023-06-14 DIAGNOSIS — M25562 Pain in left knee: Secondary | ICD-10-CM | POA: Diagnosis present

## 2023-06-14 DIAGNOSIS — I10 Essential (primary) hypertension: Secondary | ICD-10-CM

## 2023-06-14 DIAGNOSIS — C9110 Chronic lymphocytic leukemia of B-cell type not having achieved remission: Secondary | ICD-10-CM | POA: Diagnosis present

## 2023-06-14 DIAGNOSIS — N1832 Chronic kidney disease, stage 3b: Secondary | ICD-10-CM | POA: Diagnosis present

## 2023-06-14 DIAGNOSIS — R7303 Prediabetes: Secondary | ICD-10-CM

## 2023-06-14 DIAGNOSIS — S37061A Major laceration of right kidney, initial encounter: Secondary | ICD-10-CM | POA: Diagnosis present

## 2023-06-14 DIAGNOSIS — R531 Weakness: Principal | ICD-10-CM | POA: Diagnosis present

## 2023-06-14 DIAGNOSIS — M8580 Other specified disorders of bone density and structure, unspecified site: Secondary | ICD-10-CM | POA: Diagnosis present

## 2023-06-14 DIAGNOSIS — Z6831 Body mass index (BMI) 31.0-31.9, adult: Secondary | ICD-10-CM

## 2023-06-14 DIAGNOSIS — S37091A Other injury of right kidney, initial encounter: Secondary | ICD-10-CM

## 2023-06-14 DIAGNOSIS — J849 Interstitial pulmonary disease, unspecified: Secondary | ICD-10-CM | POA: Diagnosis present

## 2023-06-14 DIAGNOSIS — E66811 Obesity, class 1: Secondary | ICD-10-CM | POA: Diagnosis present

## 2023-06-14 DIAGNOSIS — S37011A Minor contusion of right kidney, initial encounter: Secondary | ICD-10-CM | POA: Diagnosis present

## 2023-06-14 DIAGNOSIS — M25561 Pain in right knee: Secondary | ICD-10-CM | POA: Diagnosis present

## 2023-06-14 DIAGNOSIS — Z888 Allergy status to other drugs, medicaments and biological substances status: Secondary | ICD-10-CM

## 2023-06-14 DIAGNOSIS — Z806 Family history of leukemia: Secondary | ICD-10-CM

## 2023-06-14 LAB — URINALYSIS, W/ REFLEX TO CULTURE (INFECTION SUSPECTED)
Bilirubin Urine: NEGATIVE
Glucose, UA: NEGATIVE mg/dL
Hgb urine dipstick: NEGATIVE
Ketones, ur: NEGATIVE mg/dL
Nitrite: NEGATIVE
Protein, ur: NEGATIVE mg/dL
Specific Gravity, Urine: 1.017 (ref 1.005–1.030)
pH: 5 (ref 5.0–8.0)

## 2023-06-14 LAB — COMPREHENSIVE METABOLIC PANEL
ALT: 48 U/L — ABNORMAL HIGH (ref 0–44)
AST: 44 U/L — ABNORMAL HIGH (ref 15–41)
Albumin: 3.8 g/dL (ref 3.5–5.0)
Alkaline Phosphatase: 84 U/L (ref 38–126)
Anion gap: 11 (ref 5–15)
BUN: 28 mg/dL — ABNORMAL HIGH (ref 8–23)
CO2: 24 mmol/L (ref 22–32)
Calcium: 14.8 mg/dL (ref 8.9–10.3)
Chloride: 104 mmol/L (ref 98–111)
Creatinine, Ser: 1.61 mg/dL — ABNORMAL HIGH (ref 0.44–1.00)
GFR, Estimated: 32 mL/min — ABNORMAL LOW (ref >60)
Glucose, Bld: 109 mg/dL — ABNORMAL HIGH (ref 70–99)
Potassium: 4.1 mmol/L (ref 3.5–5.1)
Sodium: 139 mmol/L (ref 135–145)
Total Bilirubin: 1.1 mg/dL (ref 0.0–1.2)
Total Protein: 6.6 g/dL (ref 6.5–8.1)

## 2023-06-14 LAB — CBC
HCT: 36.6 % (ref 36.0–46.0)
Hemoglobin: 12 g/dL (ref 12.0–15.0)
MCH: 30 pg (ref 26.0–34.0)
MCHC: 32.8 g/dL (ref 30.0–36.0)
MCV: 91.5 fL (ref 80.0–100.0)
Platelets: 177 10*3/uL (ref 150–400)
RBC: 4 MIL/uL (ref 3.87–5.11)
RDW: 11.9 % (ref 11.5–15.5)
WBC: 7.8 10*3/uL (ref 4.0–10.5)
nRBC: 0 % (ref 0.0–0.2)

## 2023-06-14 LAB — TROPONIN I (HIGH SENSITIVITY)
Troponin I (High Sensitivity): 19 ng/L — ABNORMAL HIGH (ref <18)
Troponin I (High Sensitivity): 15 ng/L (ref <18)

## 2023-06-14 LAB — LIPASE, BLOOD: Lipase: 35 U/L (ref 11–51)

## 2023-06-14 MED ORDER — AMLODIPINE BESYLATE 5 MG PO TABS: 5.0000 mg | ORAL_TABLET | Freq: Every day | ORAL | 3 refills | Status: DC

## 2023-06-14 MED ORDER — IOHEXOL 350 MG/ML SOLN
60.0000 mL | Freq: Once | INTRAVENOUS | Status: AC | PRN
  Administered 2023-06-14: 60 mL via INTRAVENOUS

## 2023-06-14 MED ORDER — SODIUM CHLORIDE 0.9 % IV BOLUS
500.0000 mL | Freq: Once | INTRAVENOUS | Status: AC
  Administered 2023-06-14: 500 mL via INTRAVENOUS

## 2023-06-14 NOTE — ED Provider Notes (Signed)
 [facility] AT [facility] Provider Note   CSN: [id number] Arrival date & time: 06/14/23  1753     History {Add pertinent medical, surgical, social history, OB history to HPI:1} Chief Complaint  Patient presents with   Chest Pain    Jacqueline Hancock is a 83 y.o. female.  Patient is a 83 year old female with a history of hypertension, hyperlipidemia, GERD chronic CLL, prior mantle cell lymphoma who presents with falls and weakness.  History was obtained from the patient and the patient's family.  They state that about 2 weeks ago the patient was driving and very independent.  Starting this past weekend, she has had generalized weakness.  She has been falling a lot at home because of the weakness.  She does not have any unilateral symptoms.  No speech deficits or vision changes.  She has had some dizziness which she describes more of a lightheadedness.  No syncope.  She complains of pain in her left hip and knees from the falls.  She denies any cough or cold symptoms.  No vomiting or diarrhea.  No fevers.       Home Medications Prior to Admission medications   Medication Sig Start Date End Date Taking? Authorizing Provider  amLODipine (NORVASC) 5 MG tablet Take 1 tablet (5 mg total) by mouth daily. 06/14/23   Ngetich, Dinah C, NP  B Complex Vitamins (VITAMIN B-COMPLEX PO) Take by mouth.    [provider]  calcium carbonate (OSCAL) 1500 (600 Ca) MG TABS tablet Take 500 mg by mouth daily.    [provider]  Cholecalciferol (VITAMIN D) 2000 UNITS tablet Take 4,000 Units by mouth daily.     [provider]  Ferrous Sulfate (IRON) 325 (65 Fe) MG TABS Take 325 mg by mouth daily.     [provider]  guaiFENesin (ROBITUSSIN CHEST CONGESTION PO) Take by mouth.    [provider]  ketoconazole (NIZORAL) 2 % cream Apply topically 2 (two) times daily. 01/19/23   [provider]  ketoconazole (NIZORAL) 2 % shampoo  Apply topically. 01/19/23   [provider]  Loratadine (CLARITIN PO) Take by mouth daily.    [provider]  nadolol (CORGARD) 20 MG tablet TAKE 1 TABLET BY MOUTH DAILY FOR BLOOD PRESSURE OR TREMORS 07/22/22   [name], NP  traMADol (ULTRAM) 50 MG tablet Take 1 tablet (50 mg total) by mouth every 6 (six) hours as needed (pain). 06/02/23   Zenia Resides, MD  gabapentin (NEURONTIN) 100 MG capsule Take 1 capsule 3 x /day for Neuropathy Pain Patient not taking: Reported on 01/25/2020 08/02/19 01/25/20  Lucky Cowboy, MD      Allergies    Nexium [esomeprazole magnesium] and Peanut butter flavoring agent (non-screening)    Review of Systems   Review of Systems  Physical Exam Updated Vital Signs BP (!) 190/65   Pulse 65   Temp 98.6 F (37 C) (Oral)   Resp 20   Ht 4\' 11"  (1.499 m)   Wt 71.7 kg   SpO2 100%   BMI 31.93 kg/m  Physical Exam  ED Results / Procedures / Treatments   Labs (all labs ordered are listed, but only abnormal results are displayed) Labs Reviewed  COMPREHENSIVE METABOLIC PANEL - Abnormal; Notable for the following components:      Result Value   Glucose, Bld 109 (*)    BUN 28 (*)    Creatinine, Ser 1.61 (*)    Calcium  14.8 (*)    AST 44 (*)    ALT 48 (*)    GFR, Estimated 32 (*)    All other components within normal limits  URINALYSIS, W/ REFLEX TO CULTURE (INFECTION SUSPECTED) - Abnormal; Notable for the following components:   Leukocytes,Ua TRACE (*)    Bacteria, UA RARE (*)    All other components within normal limits  TROPONIN I (HIGH SENSITIVITY) - Abnormal; Notable for the following components:   Troponin I (High Sensitivity) 19 (*)    All other components within normal limits  CBC  LIPASE, BLOOD  TROPONIN I (HIGH SENSITIVITY)    EKG None  Radiology CT Head Wo Contrast Result Date: 06/14/2023 CLINICAL DATA:  Neck trauma (Age >= 65y); Head trauma, minor (Age >= 65y) fall. EXAM: CT HEAD WITHOUT CONTRAST CT  CERVICAL SPINE WITHOUT CONTRAST TECHNIQUE: Multidetector CT imaging of the head and cervical spine was performed following the standard protocol without intravenous contrast. Multiplanar CT image reconstructions of the cervical spine were also generated. RADIATION DOSE REDUCTION: This exam was performed according to the departmental dose-optimization program which includes automated exposure control, adjustment of the mA and/or kV according to patient size and/or use of iterative reconstruction technique. COMPARISON:  CT head 11/11/2013 FINDINGS: CT HEAD FINDINGS Brain: No evidence of large-territorial acute infarction. No parenchymal hemorrhage. No mass lesion. No extra-axial collection. No mass effect or midline shift. No hydrocephalus. Basilar cisterns are patent. Vascular: No hyperdense vessel. Skull: No acute fracture or focal lesion. Sinuses/Orbits: Paranasal sinuses and mastoid air cells are clear. The orbits are unremarkable. Other: None. CT CERVICAL SPINE FINDINGS Alignment: Normal. Skull base and vertebrae: Multilevel severe degenerative changes of the spine with posterior disc osteophyte complex formation and posterior longitudinal ligament calcification at the C4-C5 level. No severe osseous neural foraminal or central canal stenosis. No acute fracture. No aggressive appearing focal osseous lesion or focal pathologic process. Soft tissues and spinal canal: No prevertebral fluid or swelling. No visible canal hematoma. Upper chest: Unremarkable. Other: None. IMPRESSION: 1. No acute intracranial abnormality. 2. No acute displaced fracture or traumatic listhesis of the cervical spine. Electronically Signed   By: Tish Frederickson M.D.   On: 06/14/2023 23:18   CT Cervical Spine Wo Contrast Result Date: 06/14/2023 CLINICAL DATA:  Neck trauma (Age >= 65y); Head trauma, minor (Age >= 65y) fall. EXAM: CT HEAD WITHOUT CONTRAST CT CERVICAL SPINE WITHOUT CONTRAST TECHNIQUE: Multidetector CT imaging of the head and  cervical spine was performed following the standard protocol without intravenous contrast. Multiplanar CT image reconstructions of the cervical spine were also generated. RADIATION DOSE REDUCTION: This exam was performed according to the departmental dose-optimization program which includes automated exposure control, adjustment of the mA and/or kV according to patient size and/or use of iterative reconstruction technique. COMPARISON:  CT head 11/11/2013 FINDINGS: CT HEAD FINDINGS Brain: No evidence of large-territorial acute infarction. No parenchymal hemorrhage. No mass lesion. No extra-axial collection. No mass effect or midline shift. No hydrocephalus. Basilar cisterns are patent. Vascular: No hyperdense vessel. Skull: No acute fracture or focal lesion. Sinuses/Orbits: Paranasal sinuses and mastoid air cells are clear. The orbits are unremarkable. Other: None. CT CERVICAL SPINE FINDINGS Alignment: Normal. Skull base and vertebrae: Multilevel severe degenerative changes of the spine with posterior disc osteophyte complex formation and posterior longitudinal ligament calcification at the C4-C5 level. No severe osseous neural foraminal or central canal stenosis. No acute fracture. No aggressive appearing focal osseous lesion or focal pathologic process. Soft tissues and spinal canal: No  prevertebral fluid or swelling. No visible canal hematoma. Upper chest: Unremarkable. Other: None. IMPRESSION: 1. No acute intracranial abnormality. 2. No acute displaced fracture or traumatic listhesis of the cervical spine. Electronically Signed   By: Tish Frederickson M.D.   On: 06/14/2023 23:18   CT ABDOMEN PELVIS W CONTRAST Result Date: 06/14/2023 CLINICAL DATA:  Abdominal trauma, blunt The pt has had 3 falls since yesterday and she reports no injury to her head she is also c/o dizziness sl nausea she is also c/o chest pain EXAM: CT ABDOMEN AND PELVIS WITH CONTRAST TECHNIQUE: Multidetector CT imaging of the abdomen and pelvis  was performed using the standard protocol following bolus administration of intravenous contrast. RADIATION DOSE REDUCTION: This exam was performed according to the departmental dose-optimization program which includes automated exposure control, adjustment of the mA and/or kV according to patient size and/or use of iterative reconstruction technique. CONTRAST:  60mL OMNIPAQUE IOHEXOL 350 MG/ML SOLN COMPARISON:  None Available. FINDINGS: Lower chest: No acute abnormality.  Small hiatal hernia. Hepatobiliary: Not enlarged. No focal lesion. No laceration or subcapsular hematoma. The gallbladder is otherwise unremarkable with no radio-opaque gallstones. No biliary ductal dilatation. Pancreas: Normal pancreatic contour. No main pancreatic duct dilatation. Spleen: Not enlarged. No focal lesion. No laceration, subcapsular hematoma, or vascular injury. Adrenals/Urinary Tract: No nodularity bilaterally. The left kidney enhances homogeneously. Shattered right superior renal pole with associated 12 x 8 x 16 cm perinephric hematoma. Nonenhancing right superior renal pole. Concern for main renal artery avulsion. No definite extravasation of excreted intravenous contrast on delayed imaging from the collecting system. No definite findings of lumen on delayed imaging to suggest active hemorrhage. No hydroureter. The urinary bladder is unremarkable. Stomach/Bowel: No small or large bowel wall thickening or dilatation. The appendix is not definitely identified with no inflammatory changes in the right lower quadrant to suggest acute appendicitis. Vasculature/Lymphatics: No abdominal aorta or iliac aneurysm. No active contrast extravasation or pseudoaneurysm. No abdominal, pelvic, inguinal lymphadenopathy. Reproductive: Normal. Other: No simple free fluid ascites. No pneumoperitoneum. No organized fluid collection. Musculoskeletal: No significant soft tissue hematoma. No acute pelvic fracture. No spinal fracture. Other ports and  devices: None. IMPRESSION: 1. 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 2. No acute fracture or traumatic malalignment of the lumbar spine. These results were called by telephone at the time of interpretation on 06/14/2023 at 11:10 pm to provider Dr. Fredderick Phenix, who verbally acknowledged these results. Electronically Signed   By: Tish Frederickson M.D.   On: 06/14/2023 23:13   DG Hip Unilat W or Wo Pelvis 2-3 Views Left Result Date: 06/14/2023 CLINICAL DATA:  Pain, recurrent falls. EXAM: DG HIP (WITH OR WITHOUT PELVIS) 2-3V LEFT COMPARISON:  None Available. FINDINGS: No fracture of the pelvis or left hip. No left hip dislocation. Intact pubic rami. Minor acetabular spurring. Pubic symphysis and sacroiliac joints are congruent. IMPRESSION: 1. No fracture or dislocation of the pelvis or left hip. 2. Minor degenerative acetabular spurring. Electronically Signed   By: Narda Rutherford M.D.   On: 06/14/2023 20:10   DG Knee 2 Views Right Result Date: 06/14/2023 CLINICAL DATA:  Pain.  Recurrent falls. EXAM: RIGHT KNEE - 1-2 VIEW COMPARISON:  None Available. FINDINGS: No evidence of fracture, dislocation, or joint effusion. Mild medial tibiofemoral joint space narrowing. Mild patellofemoral spurring. Moderate quadriceps and patellar tendon enthesophytes. IMPRESSION: 1. No fracture or subluxation of the right knee. 2. Mild osteoarthritis. Electronically Signed   By: Ivette Loyal.D.  On: 06/14/2023 20:09   DG Knee 2 Views Left Result Date: 06/14/2023 CLINICAL DATA:  Pain.  Recurrent falls. EXAM: LEFT KNEE - 1-2 VIEW COMPARISON:  None Available. FINDINGS: No evidence of fracture, dislocation, or joint effusion. Mild medial tibiofemoral joint space narrowing. Mild patellofemoral spurring. Quadriceps and patellar tendon enthesophytes. IMPRESSION: Mild osteoarthritis. No acute fracture or subluxation. Electronically Signed   By: Narda Rutherford M.D.   On: 06/14/2023 20:08   DG Chest 2  View Result Date: 06/14/2023 CLINICAL DATA:  Recurrent falls. EXAM: CHEST - 2 VIEW COMPARISON:  06/02/2023 FINDINGS: The cardiomediastinal contours are normal. Subpleural changes on prior exam are not as well demonstrated. Pulmonary vasculature is normal. No consolidation, pleural effusion, or pneumothorax. No acute osseous abnormalities are seen. IMPRESSION: No acute findings. Electronically Signed   By: Narda Rutherford M.D.   On: 06/14/2023 20:07    Procedures Procedures  {Document cardiac monitor, telemetry assessment procedure when appropriate:1}  Medications Ordered in ED Medications  sodium chloride 0.9 % bolus 500 mL (500 mLs Intravenous New Bag/Given 06/14/23 2131)  iohexol (OMNIPAQUE) 350 MG/ML injection 60 mL (60 mLs Intravenous Contrast Given 06/14/23 2146)    ED Course/ Medical Decision Making/ A&P   {   Click here for ABCD2, HEART and other calculatorsREFRESH Note before signing :1}                              Medical Decision Making  Patient is a 83 year old female who presents with generalized weakness and resulting falls.  She also has lightheadedness.  Labs were grossly nonconcerning other than her calcium is markedly elevated at 14.  CT scan of her head and cervical spine do not show any acute abnormalities.  X-rays of her extremities were interpreted by me and confirmed by the radiologist to show no evidence of fractures.  CT scan of the abdomen pelvis was performed and she has a grade 5 right kidney laceration with a large perinephric hematoma.  I discussed with Dr. Derrell Lolling with trauma surgery.  He recommends consulting urology.  Will plan admission to the hospitalist service due to her generalized weakness and hypercalcemia.  She was given some IV fluid hydration.  Final Clinical Impression(s) / ED Diagnoses Final diagnoses:  Weakness  Hypercalcemia  Laceration of right kidney, initial encounter    Rx / DC Orders ED Discharge Orders     None

## 2023-06-14 NOTE — Progress Notes (Signed)
 Provider: Richarda Blade FNP-C   Ellar Hakala, Donalee Citrin, NP  Patient Care Team: Quanetta Truss, Donalee Citrin, NP as PCP - General (Family Medicine) Flo Shanks, MD as Consulting Physician (Otolaryngology) Sharrell Ku, MD as Consulting Physician (Gastroenterology) Pennie Rushing, Maris Berger, MD (Inactive) as Consulting Physician (Obstetrics and Gynecology) Jethro Bolus, MD as Consulting Physician (Ophthalmology) Charlett Nose, [facility] (Inactive) as Pharmacist (Pharmacist)  Extended Emergency Contact Information Primary Emergency Contact: Georga Kaufmann Address: [address]          [place], [place] [address] Darden Amber of [place] Home Phone: [phone] Relation: Son Secondary Emergency Contact: Abarca,Keith Mobile Phone: [phone] Relation: Son Preferred language: English Interpreter needed? No  Code Status:  Full Code  Goals of care: Advanced Directive information    06/14/2023    6:46 PM  Advanced Directives  Does Patient Have a Medical Advance Directive? No     Chief Complaint  Patient presents with   New Patient (Initial Visit)    New patient. Patient has concern pain on right side of her stomach and she stated that she can't walk.     HPI:  Pt is a 83 y.o. female seen today for medical management of chronic diseases.   Discussed the use of AI scribe software for clinical note transcription with the patient, who gave verbal consent to proceed.  History of Present Illness   The patient is  a 33  yr.old female ,presents to establish care. She is accompanied by Martie Lee, the girlfriend of her son. She was previously under the care of Dr. Leslie Andrea, who has passed away.  She has a history of high blood pressure, currently managed with nadolol 20 mg and losartan 50 mg daily. Losartan causes itching and white bumps around her mouth. Her home blood pressure readings are approximately 136/70 mmHg. There is a family history of hypertension affecting her father and son.  She has  high cholesterol and is on medication for management. She has been diagnosed with atherosclerosis of the aorta.  She was previously diagnosed as prediabetic but has not had recent lab work to confirm her current status. She is due for a re-evaluation of her blood sugar levels.  She experiences nasal congestion and uses Claritin for relief. She previously used Flonase but discontinued it due to adverse effects. She also has itching in her ears and nose.  She reports side pain radiating upwards, described as a sensation of 'food getting stuck' in her chest. She takes tramadol 50 mg every six hours as needed for this pain, which worsens with walking. She also uses an over-the-counter antacid for acid reflux.  She has experienced two recent falls, resulting in back pain and right leg weakness. She has not used a walker or wheelchair previously but now uses a walker provided by her son. She reports frequent urination without burning or itching.  She has a history of chronic kidney disease stage 3 and was informed by a previous doctor about having a 'fatty kidney'. On chart review, CT scan showed fatty liver.   She has a history of mantle cell lymphoma and continues to follow up with a cardiologist. No recent infections or contact with sick individuals.  She underwent a hysterectomy about six years ago.  She has received a flu shot, pneumonia vaccine, and a COVID vaccine in December 2024. She has had one shingles vaccine but was unaware that a second dose is required.   Past Medical History:  Diagnosis Date   Dyspnea 2021   with  exersion    Early cataracts, bilateral    Elevated hemoglobin A1c    Fibroids    GERD (gastroesophageal reflux disease)    Hyperlipidemia    Hypertension    Lymphoma (HCC)    Osteopenia    Past Surgical History:  Procedure Laterality Date   ABDOMINAL HYSTERECTOMY     APPENDECTOMY     CATARACT EXTRACTION, BILATERAL Bilateral 09/2017   Dr. Dawna Part   PAROTID GLAND  TUMOR EXCISION  2012   benign   TONSILLECTOMY AND ADENOIDECTOMY      Allergies  Allergen Reactions   Nexium [Esomeprazole Magnesium] Other (See Comments)    Headache   Peanut Butter Flavoring Agent (Non-Screening)     Allergies as of 06/14/2023       Reactions   Nexium [esomeprazole Magnesium] Other (See Comments)   Headache   Peanut Butter Flavoring Agent (non-screening)         Medication List        Accurate as of June 14, 2023  9:24 PM. If you have any questions, ask your nurse or doctor.          STOP taking these medications    Fluocinolone Acetonide Scalp 0.01 % Oil Stopped by: Yarielys Beed C Dionisia Pacholski   fluticasone 0.05 % cream Commonly known as: CUTIVATE Stopped by: Shana Younge C Randie Bloodgood   fluticasone 50 MCG/ACT nasal spray Commonly known as: FLONASE Stopped by: Donalee Citrin Rosetta Rupnow   losartan 25 MG tablet Commonly known as: Cozaar Stopped by: Donalee Citrin Lashawnta Burgert   montelukast 10 MG tablet Commonly known as: SINGULAIR Stopped by: Donalee Citrin Keira Bohlin   tobramycin-dexamethasone ophthalmic solution Commonly known as: TOBRADEX Stopped by: Donalee Citrin Montana Bryngelson       TAKE these medications    amLODipine 5 MG tablet Commonly known as: NORVASC Take 1 tablet (5 mg total) by mouth daily. Started by: Donalee Citrin Daisie Haft   calcium carbonate 1500 (600 Ca) MG Tabs tablet Commonly known as: OSCAL Take 500 mg by mouth daily.   CLARITIN PO Take by mouth daily.   Iron 325 (65 Fe) MG Tabs Take 325 mg by mouth daily.   ketoconazole 2 % cream Commonly known as: NIZORAL Apply topically 2 (two) times daily.   ketoconazole 2 % shampoo Commonly known as: NIZORAL Apply topically.   nadolol 20 MG tablet Commonly known as: CORGARD TAKE 1 TABLET BY MOUTH DAILY FOR BLOOD PRESSURE OR TREMORS   ROBITUSSIN CHEST CONGESTION PO Take by mouth.   traMADol 50 MG tablet Commonly known as: ULTRAM Take 1 tablet (50 mg total) by mouth every 6 (six) hours as needed (pain).   VITAMIN  B-COMPLEX PO Take by mouth.   Vitamin D 50 MCG (2000 UT) tablet Take 4,000 Units by mouth daily.        Review of Systems  Immunization History  Administered Date(s) Administered   Influenza, High Dose Seasonal PF 01/28/2015, 12/11/2015, 01/25/2017, 01/22/2018, 01/10/2019, 02/03/2019, 02/05/2020, 02/03/2021, 02/03/2022, 12/27/2022   Influenza-Unspecified 01/29/2013, 12/27/2013, 01/25/2017   PFIZER(Purple Top)SARS-COV-2 Vaccination 05/11/2019, 06/03/2019   PPD Test 02/25/2013   Pfizer Covid-19 Vaccine Bivalent Booster 64yrs & up 01/20/2022   Pneumococcal Conjugate-13 01/24/2017   Pneumococcal Polysaccharide-23 02/25/2013   Td 09/13/2001, 02/20/2012   Tdap 02/25/2013   Zoster, Live 10/23/2006   Pertinent  Health Maintenance Due  Topic Date Due   INFLUENZA VACCINE  Completed   DEXA SCAN  Completed      02/11/2021    2:54 PM 04/12/2021   11:54 AM 08/05/2021  12:08 AM 08/17/2022   10:08 AM 06/14/2023   10:41 AM  Fall Risk  Falls in the past year?   0 0 1  Was there an injury with Fall?     0  Fall Risk Category Calculator     2  (RETIRED) Patient Fall Risk Level Low fall risk Low fall risk     Patient at Risk for Falls Due to   No Fall Risks  Impaired balance/gait;Impaired mobility  Fall risk Follow up   Falls evaluation completed;Education provided;Falls prevention discussed  Falls evaluation completed   Functional Status Survey:    Vitals:   06/14/23 0839  BP: 138/60  Pulse: 60  Resp: (!) 21  Temp: 97.9 F (36.6 C)  SpO2: 99%  Weight: 158 lb (71.7 kg)  Height: 4\' 11"  (1.499 m)   Body mass index is 31.91 kg/m. Physical Exam  VITALS: BP- 138/60 GENERAL: Alert, cooperative, well developed, no acute distress. HEENT: Normocephalic, normal oropharynx, moist mucous membranes, nose without swelling, no sinus tenderness. NECK: Normal. CHEST: Clear to auscultation bilaterally, no wheezes, rhonchi, or crackles. CARDIOVASCULAR: Normal heart rate and rhythm, S1 and S2  normal without murmurs. ABDOMEN: Tender to palpation, non-distended, without organomegaly, normal bowel sounds. EXTREMITIES: No cyanosis or edema, pain in right leg on movement. NEUROLOGICAL: Cranial nerves grossly intact, moves all extremities without gross motor or sensory deficit. SKIN: No rash, lesion or erythema  PSYCHIATRY/BEHAVIORAL: mood stable   Labs reviewed: Recent Labs    02/14/23 1119 03/14/23 1100 06/14/23 1845  NA 139 137 139  K 4.5 4.4 4.1  CL 102 105 104  CO2 26 25 24   GLUCOSE 98 108* 109*  BUN 22 33* 28*  CREATININE 1.19* 1.43* 1.61*  CALCIUM 10.2 10.6* 14.8*  MG 2.1  --   --    Recent Labs    12/16/22 1025 12/29/22 1031 02/14/23 1119 03/14/23 1100 06/14/23 1845  AST 21   < > 21 22 44*  ALT 21   < > 20 21 48*  ALKPHOS 56  --   --  63 84  BILITOT 1.1   < > 0.7 0.8 1.1  PROT 6.6   < > 6.8 7.1 6.6  ALBUMIN 4.3  --   --  4.6 3.8   < > = values in this interval not displayed.   Recent Labs    12/29/22 1031 02/14/23 1119 03/14/23 1100 06/14/23 1845  WBC 5.6 4.7 5.8 7.8  NEUTROABS 2,615 2,228 2.7  --   HGB 14.3 14.0 14.1 12.0  HCT 42.7 41.2 42.0 36.6  MCV 94.7 92.0 91.7 91.5  PLT 176 153 142* 177   Lab Results  Component Value Date   TSH 1.89 02/14/2023   Lab Results  Component Value Date   HGBA1C 6.1 (H) 02/14/2023   Lab Results  Component Value Date   CHOL 183 02/14/2023   HDL 35 (L) 02/14/2023   LDLCALC 115 (H) 02/14/2023   TRIG 221 (H) 02/14/2023   CHOLHDL 5.2 (H) 02/14/2023    Significant Diagnostic Results in last 30 days:  DG Hip Unilat W or Wo Pelvis 2-3 Views Left Result Date: 06/14/2023 CLINICAL DATA:  Pain, recurrent falls. EXAM: DG HIP (WITH OR WITHOUT PELVIS) 2-3V LEFT COMPARISON:  None Available. FINDINGS: No fracture of the pelvis or left hip. No left hip dislocation. Intact pubic rami. Minor acetabular spurring. Pubic symphysis and sacroiliac joints are congruent. IMPRESSION: 1. No fracture or dislocation of the pelvis  or left hip. 2.  Minor degenerative acetabular spurring. Electronically Signed   By: Narda Rutherford M.D.   On: 06/14/2023 20:10   DG Knee 2 Views Right Result Date: 06/14/2023 CLINICAL DATA:  Pain.  Recurrent falls. EXAM: RIGHT KNEE - 1-2 VIEW COMPARISON:  None Available. FINDINGS: No evidence of fracture, dislocation, or joint effusion. Mild medial tibiofemoral joint space narrowing. Mild patellofemoral spurring. Moderate quadriceps and patellar tendon enthesophytes. IMPRESSION: 1. No fracture or subluxation of the right knee. 2. Mild osteoarthritis. Electronically Signed   By: Narda Rutherford M.D.   On: 06/14/2023 20:09   DG Knee 2 Views Left Result Date: 06/14/2023 CLINICAL DATA:  Pain.  Recurrent falls. EXAM: LEFT KNEE - 1-2 VIEW COMPARISON:  None Available. FINDINGS: No evidence of fracture, dislocation, or joint effusion. Mild medial tibiofemoral joint space narrowing. Mild patellofemoral spurring. Quadriceps and patellar tendon enthesophytes. IMPRESSION: Mild osteoarthritis. No acute fracture or subluxation. Electronically Signed   By: Narda Rutherford M.D.   On: 06/14/2023 20:08   DG Chest 2 View Result Date: 06/14/2023 CLINICAL DATA:  Recurrent falls. EXAM: CHEST - 2 VIEW COMPARISON:  06/02/2023 FINDINGS: The cardiomediastinal contours are normal. Subpleural changes on prior exam are not as well demonstrated. Pulmonary vasculature is normal. No consolidation, pleural effusion, or pneumothorax. No acute osseous abnormalities are seen. IMPRESSION: No acute findings. Electronically Signed   By: Narda Rutherford M.D.   On: 06/14/2023 20:07   DG Ribs Unilateral W/Chest Right Result Date: 06/02/2023 CLINICAL DATA:  Right axillary and posterior rib pain for the past 2 weeks. No injury. EXAM: RIGHT RIBS AND CHEST - 3+ VIEW COMPARISON:  CT chest dated December 02, 2022. FINDINGS: No fracture or other bone lesions are seen involving the ribs. There is no evidence of pneumothorax or pleural effusion. Mild  interstitial thickening at the lung bases has progressed since the prior x-ray. Heart size and mediastinal contours are within normal limits. IMPRESSION: 1. No rib fracture. 2. Mildly progressed chronic interstitial lung disease. Electronically Signed   By: Obie Dredge M.D.   On: 06/02/2023 13:52    Assessment/Plan  Hypertension Hypertension is managed with nadolol and losartan. Blood pressure today was 138/60 mmHg. She reports itching and rash with losartan, ongoing for years despite dose adjustments. Home blood pressure readings have been slightly elevated, with a goal of less than 130/80 mmHg. Due to adverse effects, losartan will be discontinued and replaced with amlodipine. Amlodipine will be started at a low dose, and blood pressure will be monitored at home. Follow-up in two weeks to assess blood pressure control and adjust amlodipine dosage if necessary. - Discontinue losartan due to adverse effects. - Initiate amlodipine at a low dose and monitor blood pressure at home. - Follow up in two weeks to assess blood pressure control and adjust amlodipine dosage if necessary.  Chronic Kidney Disease Stage 3 Chronic kidney disease stage 3. Advised to avoid NSAIDs and limit tramadol use due to potential nephrotoxicity. Tylenol is recommended for pain management. Limiting tramadol is based on its classification as an anti-inflammatory, which can exacerbate kidney issues. - Avoid NSAIDs and limit tramadol use. - Use Tylenol for pain management.  Fatty Liver Disease Fatty liver disease diagnosed via CT scan in May 2024. Advised to maintain a healthy diet low in fats and sugars. Referral to a gastroenterologist is planned for further evaluation and management. Protonix will be started for acid reflux symptoms, which may be related to the liver condition. - Refer to gastroenterologist for further evaluation and management. - Start  Protonix for acid reflux  symptoms.  Atherosclerosis Atherosclerosis of the aorta. Advised to continue cholesterol medication to manage plaque buildup and prevent further cardiovascular complications. - Continue cholesterol medication.  Osteopenia Osteopenia with current vitamin D and calcium supplementation. Bone density was normal in August 2024. Continued supplementation is advised to support bone health. - Continue vitamin D and calcium supplementation.  Frequent Falls Reports recent falls and weakness, possibly related to urinary frequency or infection. No signs of infection reported, but urine specimen will be checked. Physical therapy is recommended to improve strength and balance. - Order urine specimen to rule out infection. - Refer to physical therapy for strength and balance training.  Nasal Congestion Experiences nasal congestion and uses Claritin for symptom relief. Previously used Flonase but discontinued due to adverse effects. Claritin is effective, especially during pollen season. - Continue Claritin for nasal congestion.  General Health Maintenance Up to date with flu and pneumonia vaccines. Due for tetanus and shingles vaccines. Last COVID vaccine was in December 2024. Maintaining these vaccinations is important to prevent infections. - Obtain tetanus and shingles vaccines at the pharmacy. - Ensure COVID vaccine records are updated.  Goals of Care Discussed advance directives, inc luding CPR and use of feeding tubes. Desires full code status and will consider further directives with family. Provided with a form to discuss preferences regarding life-sustaining treatments, such as feeding tubes and antibiotics, with family. - Provide advance directive form for discussion with family.  Follow-up Requires follow-up for blood pressure management and further evaluation of liver condition. Follow-up will ensure the new hypertension management plan is effective and liver condition is appropriately  addressed. - Schedule follow-up appointment in two weeks to assess blood pressure control. - Ensure referral to gastroenterologist is completed.   Family/ staff Communication: Reviewed plan of care with patient verbalized understanding   Labs/tests ordered:  - CBC with Differential/Platelet - CMP with eGFR([facility]) - TSH - Hgb A1C - Lipid panel - Vitamin D level    Next Appointment : Return in about 6 months (around 12/15/2023) for medical mangement of chronic issues.Marland Kitchen   Spent 45 minutes of Face to face and non-face to face with patient  >50% time spent counseling; reviewing medical record; tests; labs; documentation and developing future plan of care.   Caesar Bookman, NP

## 2023-06-14 NOTE — ED Provider Triage Note (Signed)
 Emergency Medicine Provider Triage Evaluation Note  Jacqueline Hancock , a 83 y.o. female  was evaluated in triage.  Pt complains of falls.  Review of Systems  Positive:  Negative:   Physical Exam  Temp 98.5 F (36.9 C)   Ht 4\' 11"  (1.499 m)   Wt 71.7 kg   BMI 31.93 kg/m  Gen:   Awake, no distress   Resp:  Normal effort  MSK:   Moves extremities without difficulty  Other:    Medical Decision Making  Medically screening exam initiated at 6:44 PM.  Appropriate orders placed.  Jacqueline Hancock was informed that the remainder of the evaluation will be completed by another provider, this initial triage assessment does not replace that evaluation, and the importance of remaining in the ED until their evaluation is complete.  Worsening weakness since Sunday. Multiple falls over the past 24 hours. Patient concerned for nausea, chest pain, and dizziness. Patient with abdominal pain, left hip pain, and BL knee pain as well.   Jacqueline Hancock, [place] 06/14/23 [phone]

## 2023-06-14 NOTE — ED Triage Notes (Signed)
 The pt has had 3 falls since yesterday  and she reports no injury to her head she is also c/o dizziness sl nausea  she is also c/o chest pain

## 2023-06-14 NOTE — ED Triage Notes (Addendum)
 The pt is c/o pain in her entire rt side of her body for  2 weeks

## 2023-06-14 NOTE — Patient Instructions (Signed)
 Please get tetanus ,shingles and COVID-19 vaccine at the pharmacy.

## 2023-06-15 DIAGNOSIS — M25552 Pain in left hip: Secondary | ICD-10-CM | POA: Diagnosis present

## 2023-06-15 DIAGNOSIS — Z6831 Body mass index (BMI) 31.0-31.9, adult: Secondary | ICD-10-CM | POA: Diagnosis not present

## 2023-06-15 DIAGNOSIS — J849 Interstitial pulmonary disease, unspecified: Secondary | ICD-10-CM

## 2023-06-15 DIAGNOSIS — E66811 Obesity, class 1: Secondary | ICD-10-CM | POA: Diagnosis present

## 2023-06-15 DIAGNOSIS — Z9181 History of falling: Secondary | ICD-10-CM | POA: Diagnosis not present

## 2023-06-15 DIAGNOSIS — I129 Hypertensive chronic kidney disease with stage 1 through stage 4 chronic kidney disease, or unspecified chronic kidney disease: Secondary | ICD-10-CM | POA: Diagnosis present

## 2023-06-15 DIAGNOSIS — C9110 Chronic lymphocytic leukemia of B-cell type not having achieved remission: Secondary | ICD-10-CM | POA: Diagnosis present

## 2023-06-15 DIAGNOSIS — Y92019 Unspecified place in single-family (private) house as the place of occurrence of the external cause: Secondary | ICD-10-CM | POA: Diagnosis not present

## 2023-06-15 DIAGNOSIS — K5900 Constipation, unspecified: Secondary | ICD-10-CM | POA: Diagnosis not present

## 2023-06-15 DIAGNOSIS — N1832 Chronic kidney disease, stage 3b: Secondary | ICD-10-CM | POA: Diagnosis present

## 2023-06-15 DIAGNOSIS — W19XXXA Unspecified fall, initial encounter: Secondary | ICD-10-CM | POA: Diagnosis present

## 2023-06-15 DIAGNOSIS — S37061A Major laceration of right kidney, initial encounter: Secondary | ICD-10-CM | POA: Diagnosis present

## 2023-06-15 DIAGNOSIS — N179 Acute kidney failure, unspecified: Secondary | ICD-10-CM | POA: Diagnosis present

## 2023-06-15 DIAGNOSIS — R5381 Other malaise: Secondary | ICD-10-CM | POA: Diagnosis present

## 2023-06-15 DIAGNOSIS — S37031A Laceration of right kidney, unspecified degree, initial encounter: Secondary | ICD-10-CM | POA: Diagnosis not present

## 2023-06-15 DIAGNOSIS — R531 Weakness: Principal | ICD-10-CM

## 2023-06-15 DIAGNOSIS — E785 Hyperlipidemia, unspecified: Secondary | ICD-10-CM | POA: Diagnosis present

## 2023-06-15 DIAGNOSIS — S37091A Other injury of right kidney, initial encounter: Secondary | ICD-10-CM

## 2023-06-15 DIAGNOSIS — M25561 Pain in right knee: Secondary | ICD-10-CM | POA: Diagnosis present

## 2023-06-15 DIAGNOSIS — M8580 Other specified disorders of bone density and structure, unspecified site: Secondary | ICD-10-CM | POA: Diagnosis present

## 2023-06-15 DIAGNOSIS — Z833 Family history of diabetes mellitus: Secondary | ICD-10-CM | POA: Diagnosis not present

## 2023-06-15 DIAGNOSIS — Z8249 Family history of ischemic heart disease and other diseases of the circulatory system: Secondary | ICD-10-CM | POA: Diagnosis not present

## 2023-06-15 DIAGNOSIS — M25562 Pain in left knee: Secondary | ICD-10-CM | POA: Diagnosis present

## 2023-06-15 DIAGNOSIS — R296 Repeated falls: Secondary | ICD-10-CM | POA: Diagnosis present

## 2023-06-15 DIAGNOSIS — K219 Gastro-esophageal reflux disease without esophagitis: Secondary | ICD-10-CM | POA: Diagnosis present

## 2023-06-15 DIAGNOSIS — R112 Nausea with vomiting, unspecified: Secondary | ICD-10-CM | POA: Diagnosis not present

## 2023-06-15 DIAGNOSIS — S37011A Minor contusion of right kidney, initial encounter: Secondary | ICD-10-CM | POA: Diagnosis present

## 2023-06-15 LAB — COMPREHENSIVE METABOLIC PANEL
ALT: 37 U/L (ref 0–44)
AST: 31 U/L (ref 15–41)
Albumin: 3.2 g/dL — ABNORMAL LOW (ref 3.5–5.0)
Alkaline Phosphatase: 69 U/L (ref 38–126)
Anion gap: 6 (ref 5–15)
BUN: 23 mg/dL (ref 8–23)
CO2: 25 mmol/L (ref 22–32)
Calcium: 12.7 mg/dL — ABNORMAL HIGH (ref 8.9–10.3)
Chloride: 110 mmol/L (ref 98–111)
Creatinine, Ser: 1.4 mg/dL — ABNORMAL HIGH (ref 0.44–1.00)
GFR, Estimated: 38 mL/min — ABNORMAL LOW (ref >60)
Glucose, Bld: 107 mg/dL — ABNORMAL HIGH (ref 70–99)
Potassium: 3.6 mmol/L (ref 3.5–5.1)
Sodium: 141 mmol/L (ref 135–145)
Total Bilirubin: 1.2 mg/dL (ref 0.0–1.2)
Total Protein: 5.7 g/dL — ABNORMAL LOW (ref 6.5–8.1)

## 2023-06-15 LAB — CBC
HCT: 33.2 % — ABNORMAL LOW (ref 36.0–46.0)
Hemoglobin: 11 g/dL — ABNORMAL LOW (ref 12.0–15.0)
MCH: 30.3 pg (ref 26.0–34.0)
MCHC: 33.1 g/dL (ref 30.0–36.0)
MCV: 91.5 fL (ref 80.0–100.0)
Platelets: 135 10*3/uL — ABNORMAL LOW (ref 150–400)
RBC: 3.63 MIL/uL — ABNORMAL LOW (ref 3.87–5.11)
RDW: 11.9 % (ref 11.5–15.5)
WBC: 6 10*3/uL (ref 4.0–10.5)
nRBC: 0 % (ref 0.0–0.2)

## 2023-06-15 LAB — HEMOGLOBIN AND HEMATOCRIT, BLOOD
HCT: 33.4 % — ABNORMAL LOW (ref 36.0–46.0)
Hemoglobin: 11.1 g/dL — ABNORMAL LOW (ref 12.0–15.0)

## 2023-06-15 MED ORDER — BISACODYL 5 MG PO TBEC: 5.0000 mg | DELAYED_RELEASE_TABLET | Freq: Every day | ORAL | Status: DC | PRN

## 2023-06-15 MED ORDER — ACETAMINOPHEN 650 MG RE SUPP: 650.0000 mg | Freq: Four times a day (QID) | RECTAL | Status: DC | PRN

## 2023-06-15 MED ORDER — ONDANSETRON HCL 4 MG/2ML IJ SOLN
4.0000 mg | Freq: Four times a day (QID) | INTRAMUSCULAR | Status: DC | PRN
  Administered 2023-06-15 – 2023-06-17 (×3): 4 mg via INTRAVENOUS
  Filled 2023-06-15 (×3): qty 2

## 2023-06-15 MED ORDER — AMLODIPINE BESYLATE 5 MG PO TABS
5.0000 mg | ORAL_TABLET | Freq: Every day | ORAL | Status: DC
  Administered 2023-06-15 – 2023-06-18 (×4): 5 mg via ORAL
  Filled 2023-06-15 (×4): qty 1

## 2023-06-15 MED ORDER — CALCITONIN (SALMON) 200 UNIT/ML IJ SOLN
4.0000 [IU]/kg | Freq: Two times a day (BID) | INTRAMUSCULAR | Status: AC
  Administered 2023-06-15 – 2023-06-16 (×4): 286 [IU] via INTRAMUSCULAR
  Filled 2023-06-15 (×5): qty 1.43

## 2023-06-15 MED ORDER — SODIUM CHLORIDE 0.9% FLUSH
3.0000 mL | Freq: Two times a day (BID) | INTRAVENOUS | Status: DC
  Administered 2023-06-15 – 2023-06-17 (×3): 3 mL via INTRAVENOUS

## 2023-06-15 MED ORDER — NADOLOL 20 MG PO TABS
20.0000 mg | ORAL_TABLET | Freq: Every day | ORAL | Status: DC
  Administered 2023-06-15 – 2023-06-18 (×4): 20 mg via ORAL
  Filled 2023-06-15 (×4): qty 1

## 2023-06-15 MED ORDER — ZOLEDRONIC ACID 4 MG/5ML IV CONC
4.0000 mg | Freq: Once | INTRAVENOUS | Status: AC
  Administered 2023-06-15: 4 mg via INTRAVENOUS
  Filled 2023-06-15 (×3): qty 5

## 2023-06-15 MED ORDER — LACTATED RINGERS IV SOLN: INTRAVENOUS | Status: AC

## 2023-06-15 MED ORDER — ACETAMINOPHEN 325 MG PO TABS: 650.0000 mg | ORAL_TABLET | Freq: Four times a day (QID) | ORAL | Status: DC | PRN

## 2023-06-15 MED ORDER — ONDANSETRON HCL 4 MG PO TABS
4.0000 mg | ORAL_TABLET | Freq: Four times a day (QID) | ORAL | Status: DC | PRN
  Filled 2023-06-15: qty 1

## 2023-06-15 MED ORDER — ZOLEDRONIC ACID 4 MG/100ML IV SOLN
4.0000 mg | Freq: Once | INTRAVENOUS | Status: DC
  Filled 2023-06-15: qty 100

## 2023-06-15 MED ORDER — SODIUM CHLORIDE 0.9 % IV SOLN: INTRAVENOUS | Status: AC

## 2023-06-15 MED ORDER — SENNOSIDES-DOCUSATE SODIUM 8.6-50 MG PO TABS: 1.0000 | ORAL_TABLET | Freq: Every evening | ORAL | Status: DC | PRN

## 2023-06-15 NOTE — Progress Notes (Signed)
 No charge progress note.  Jacqueline Hancock is a 83 y.o. female with medical history significant for CLL, ILD, CKD stage IIIb, HTN, HLD who is admitted with hypercalcemia and traumatic right perinephric hematoma.  On presentation patient had mildly elevated blood pressure otherwise stable vital, labs with WBC 7.8, hemoglobin 12, BUN 28, creatinine 1.61, calcium 14.8, AST 44, ALT 48, alkaline phos 84, T. bili 1.1, lipase 35,troponin 19 > 15. UA negative for UTI.   Extensive trauma imaging performed and notable for grade 5 right renal injury with 12 x 8 x 16 cm perinephric hematoma, otherwise no acute injury or pathology.   Urology was consulted and they were recommending bedrest for 48 hours and trending hemoglobin.  If there is any significant fall or patient becomes unstable then she will need IR consultation for embolization.  They will follow her up at outpatient for repeat imaging to see the resolution of hematoma and ruling out any underlying renal mass.  Patient was given IV fluid and Zometa/calcitonin for significant hypercalcemia.  3/13: Vital stable, labs with improvement of creatinine to 1.4 and calcium to 12.7.  Patient was taking calcium supplement at home she was instructed to stop that.  Parathyroid hormone levels were ordered and pending.  Vitamin D levels are pending.  CBC with decrease of hemoglobin to 11 but all cell line decreased so may be some dilutional effect.  Pain seems well-controlled.  Patient needed close monitoring of hemoglobin, hemoglobin check Q8 hourly was ordered.  If there is a consistent decrease or worsening pain, low threshold to repeat imaging and consulting IR for embolization.  Patient was seen and examined.  Mild right flank tenderness.  Daughter was at bedside.  Rest of the exam was benign.  We will continue with bedrest and IV fluid for another day. Continue to monitor calcium and hemoglobin.

## 2023-06-15 NOTE — Plan of Care (Signed)

## 2023-06-15 NOTE — Hospital Course (Addendum)
 Jacqueline Hancock is a 83 y.o. female with medical history significant for CLL, ILD, CKD stage IIIb, HTN, HLD who is admitted with hypercalcemia and traumatic right perinephric hematoma.  On presentation patient had mildly elevated blood pressure otherwise stable vital, labs with WBC 7.8, hemoglobin 12, BUN 28, creatinine 1.61, calcium 14.8, AST 44, ALT 48, alkaline phos 84, T. bili 1.1, lipase 35,troponin 19 > 15. UA negative for UTI.   Extensive trauma imaging performed and notable for grade 5 right renal injury with 12 x 8 x 16 cm perinephric hematoma, otherwise no acute injury or pathology.   Urology was consulted and they were recommending bedrest for 48 hours and trending hemoglobin.  If there is any significant fall or patient becomes unstable then she will need IR consultation for embolization.  They will follow her up at outpatient for repeat imaging to see the resolution of hematoma and ruling out any underlying renal mass.  Patient was given IV fluid and Zometa/calcitonin for significant hypercalcemia.  3/13: Vital stable, labs with improvement of creatinine to 1.4 and calcium to 12.7.  Patient was taking calcium supplement at home she was instructed to stop that.  Parathyroid hormone levels were ordered and pending.  Vitamin D levels are pending.  CBC with decrease of hemoglobin to 11 but all cell line decreased so may be some dilutional effect.  Pain seems well-controlled.  Patient needed close monitoring of hemoglobin, hemoglobin check Q8 hourly was ordered.  If there is a consistent decrease or worsening pain, low threshold to repeat imaging and consulting IR for embolization.

## 2023-06-15 NOTE — ED Provider Notes (Signed)
 Patient signed out to me by Dr. Fredderick Phenix.  Patient with change in her behavior over the last few days.  She had been quite independent prior, now has had generalized weakness with multiple falls.  Workup shows fairly significant hypercalcemia which may be causing her weakness, mental status changes, falls.  Unsure if this is related to her history of CLL.  No obvious bone lesions seen on imaging.  Workup also shows a grade 5 right renal injury.  Vitals are stable, hemoglobin is stable.  Imaging did not show evidence of active bleeding.  Discussed with Dr. Jennette Bill, on-call for urology.  He will follow and look at the patient's images but recommends bedrest, trending hemoglobin and hematocrit.  If there seems to be signs of bleeding or she becomes unstable, intervention would be by IR for embolization.  Admit to medicine.   Gilda Crease, MD 06/15/23 [phone]

## 2023-06-15 NOTE — H&P (Signed)
 History and Physical    Jacqueline Hancock ZOX:[id number] DOB: 1941/01/05 DOA: 06/14/2023  PCP: Caesar Bookman, NP  Patient coming from: Home  I have personally briefly reviewed patient's old medical records in [facility]  Chief Complaint: Falls at home  HPI: Jacqueline Hancock is a 83 y.o. female with medical history significant for CLL, ILD, CKD stage IIIb, HTN, HLD who presented to the ED for evaluation after falling at home.  Patient states that over the last couple weeks she has had several falls at home.  She says when she is walking she feels like her knees locked up which has resulted in her falling.  She has been having pain to her right flank area since 1 of these falls but otherwise no obvious injury.  She does note feeling somewhat confused at times as well recently.  She reports new constipation as well.  ED Course  Labs/Imaging on admission: I have personally reviewed following labs and imaging studies.  Initial vitals showed BP 163/70, pulse 62, RR 22, temp 98.5 F, SpO2 100% on room air.  Labs showed WBC 7.8, hemoglobin 12.0, platelets 177,000, sodium 139, potassium 4.1, bicarb 24, BUN 28, creatinine 1.61, serum glucose 109, calcium 14.8, AST 44, ALT 48, alk phos 84, total bilirubin 1.1, lipase 35, troponin 19 > 15.  UA negative for UTI.  Extensive trauma imaging performed and notable for grade 5 right renal injury with 12 x 8 x 16 cm perinephric hematoma, otherwise no acute injury or pathology.  Patient was given 500 cc normal saline.  EDP discussed with urology Dr. Jennette Bill who recommended medical admission, trend H&H, and if patient becomes unstable then next step would be IR for embolization.  The hospitalist service was consulted to admit.  Review of Systems: All systems reviewed and are negative except as documented in history of present illness above.   Past Medical History:  Diagnosis Date   Dyspnea 2021   with exersion    Early cataracts, bilateral    Elevated  hemoglobin A1c    Fibroids    GERD (gastroesophageal reflux disease)    Hyperlipidemia    Hypertension    Lymphoma (HCC)    Osteopenia     Past Surgical History:  Procedure Laterality Date   ABDOMINAL HYSTERECTOMY     APPENDECTOMY     CATARACT EXTRACTION, BILATERAL Bilateral 09/2017   Dr. 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   TONSILLECTOMY AND ADENOIDECTOMY      Social History: Social History   Tobacco Use   Smoking status: Never   Smokeless tobacco: Never  Vaping Use   Vaping status: Never Used  Substance Use Topics   Alcohol use: No   Drug use: Never    Allergies  Allergen Reactions   Nexium [Esomeprazole Magnesium] Other (See Comments)    Headache   Peanut Butter Flavoring Agent (Non-Screening) Itching    Family History  Problem Relation Age of Onset   Leukemia Mother    Hypertension Father    Diabetes Sister    Heart disease Brother    Diabetes Brother    Diabetes Brother    Hypertension Son      Prior to Admission medications   Medication Sig Start Date End Date Taking? Authorizing Provider  amLODipine (NORVASC) 5 MG tablet Take 1 tablet (5 mg total) by mouth daily. 06/14/23   Ngetich, Dinah C, NP  B Complex Vitamins (VITAMIN B-COMPLEX PO) Take by mouth.  [provider]  calcium carbonate (OSCAL) 1500 (600 Ca) MG TABS tablet Take 500 mg by mouth daily.    [provider]  Cholecalciferol (VITAMIN D) 2000 UNITS tablet Take 4,000 Units by mouth daily.     [provider]  Ferrous Sulfate (IRON) 325 (65 Fe) MG TABS Take 325 mg by mouth daily.     [provider]  guaiFENesin (ROBITUSSIN CHEST CONGESTION PO) Take by mouth.    [provider]  ketoconazole (NIZORAL) 2 % cream Apply topically 2 (two) times daily. 01/19/23   [provider]  ketoconazole (NIZORAL) 2 % shampoo Apply topically. 01/19/23   [provider]  Loratadine (CLARITIN PO) Take by mouth daily.     [provider]  nadolol (CORGARD) 20 MG tablet TAKE 1 TABLET BY MOUTH DAILY FOR BLOOD PRESSURE OR TREMORS 07/22/22   [name], NP  traMADol (ULTRAM) 50 MG tablet Take 1 tablet (50 mg total) by mouth every 6 (six) hours as needed (pain). 06/02/23   Zenia Resides, MD  gabapentin (NEURONTIN) 100 MG capsule Take 1 capsule 3 x /day for Neuropathy Pain Patient not taking: Reported on 01/25/2020 08/02/19 01/25/20  Lucky Cowboy, MD    Physical Exam: Vitals:   06/15/23 0030 06/15/23 0045 06/15/23 0100 06/15/23 0145  BP: (!) 175/79 (!) 160/85 (!) 159/71   Pulse: 62 60 63 61  Resp: 15 16 14 19   Temp:      TempSrc:      SpO2: 100% 99% 100% 100%  Weight:      Height:       Constitutional: Resting in bed, NAD, calm, comfortable Eyes: EOMI, lids and conjunctivae normal ENMT: Mucous membranes are moist. Posterior pharynx clear of any exudate or lesions.Normal dentition.  Neck: normal, supple, no masses. Respiratory: clear to auscultation bilaterally, no wheezing, no crackles. Normal respiratory effort. No accessory muscle use.  Cardiovascular: Regular rate and rhythm, no murmurs / rubs / gallops. No extremity edema. 2+ pedal pulses. Abdomen: no tenderness, no masses palpated. Musculoskeletal: no clubbing / cyanosis. No joint deformity upper and lower extremities. Good ROM, no contractures. Normal muscle tone.  Skin: no rashes, lesions, ulcers. No induration Neurologic: Sensation intact. Strength 5/5 in all 4.  Psychiatric: Alert and oriented x 3. Normal mood.   EKG: Personally reviewed. Sinus rhythm, rate 64, no acute ischemic changes.  Assessment/Plan Principal Problem:   Hypercalcemia Active Problems:   Traumatic perinephric hematoma of right side, initial encounter   Hypertension   Chronic kidney disease, stage 3b (HCC)   ILD (interstitial lung disease) (HCC)   CLL (chronic lymphocytic leukemia) (HCC)   Jacqueline Hancock is a 83 y.o. female with medical history  significant for CLL, ILD, CKD stage IIIb, HTN, HLD who is admitted with hypercalcemia and traumatic right perinephric hematoma.  Assessment and Plan: Hypercalcemia: Corrected calcium is 15.0, unclear etiology.  Continue IV normal saline hydration and give Zometa/calcitonin.  Grade 5 right renal injury with perinephric hematoma: Likely occurring after one of her recent falls.  Hemoglobin stable.  Urology to consult in AM.  If patient should become unstable or show signs of active bleeding then recommendation is for intervention by IR for embolization.  CKD stage IIIb: Creatinine slightly increased from baseline.  Continue IV fluids and repeat labs in AM.  CLL: Follows with heme-onc Dr. Candise Che, currently monitoring off ibrutinib.  CBC is stable.  Interstitial lung disease: Follows with pulmonology Dr. Marchelle Gearing, appears to be mild ILD not requiring  maintenance therapy.  Hypertension: Continue nadolol and amlodipine.   DVT prophylaxis: SCDs Start: 06/15/23 0144 Code Status: Full code, confirmed with patient on admission Family Communication: Son at bedside Disposition Plan: From home and likely discharge to home pending clinical progress Consults called: Urology Severity of Illness: The appropriate patient status for this patient is INPATIENT. Inpatient status is judged to be reasonable and necessary in order to provide the required intensity of service to ensure the patient's safety. The patient's presenting symptoms, physical exam findings, and initial radiographic and laboratory data in the context of their chronic comorbidities is felt to place them at high risk for further clinical deterioration. Furthermore, it is not anticipated that the patient will be medically stable for discharge from the hospital within 2 midnights of admission.   * I certify that at the point of admission it is my clinical judgment that the patient will require inpatient hospital care spanning beyond 2 midnights  from the point of admission due to high intensity of service, high risk for further deterioration and high frequency of surveillance required.Darreld Mclean MD [facility]  If 7PM-7AM, please contact night-coverage [email or web address]  06/15/2023, 1:51 AM

## 2023-06-15 NOTE — Consult Note (Signed)
 I have been asked to see the patient by Dr. Darreld Mclean, for evaluation and management of Right renal laceration.  History of present illness: 83 year old female who presented the ER after a series of falls as well as some right-sided flank pain.  ER did perform a CT scan which demonstrated grade 5 renal laceration.  Laceration likely secondary to her traumatic falls.  Patient overall states she has had some right abdominal pain, no back pain and some mild to moderate right-sided flank pain.  She denies any nausea vomiting fevers or chills.  She denies any gross hematuria.  Labs are notable for hypercalcemia, hematocrit within normal limits at 36.6, no white count noted.  Creatinine 1.6 baseline 1.2-1.3.  Patient's only malignant history is lymphoma.  PMH: Dyspnea fibroids, GERD, HLD, HTN, osteopenia, lymphoma.   Review of systems: A 12 point comprehensive review of systems was obtained and is negative unless otherwise stated in the history of present illness.  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   Current Outpatient Medications on File Prior to Encounter  Medication Sig Dispense Refill   B Complex Vitamins (VITAMIN B-COMPLEX PO) Take 1 tablet by  mouth daily.     calcium carbonate (OSCAL) 1500 (600 Ca) MG TABS tablet Take 500 mg by mouth daily.     Cholecalciferol (VITAMIN D) 2000 UNITS tablet Take 4,000 Units by mouth daily.      Ferrous Sulfate (IRON) 325 (65 Fe) MG TABS Take 325 mg by mouth daily.      ketoconazole (NIZORAL) 2 % cream Apply topically 2 (two) times daily.     ketoconazole (NIZORAL) 2 % shampoo Apply topically.     nadolol (CORGARD) 20 MG tablet TAKE 1 TABLET BY MOUTH DAILY FOR BLOOD PRESSURE OR TREMORS 90 tablet 3   traMADol (ULTRAM) 50 MG tablet Take 1 tablet (50 mg total) by mouth every 6 (six) hours as needed (pain). 12 tablet 0   amLODipine (NORVASC) 5 MG tablet Take 1 tablet (5 mg total) by mouth daily. (Patient not taking: Reported on 06/15/2023) 30 tablet 3   Loratadine (CLARITIN PO) Take by mouth daily.     [DISCONTINUED] gabapentin (NEURONTIN) 100 MG capsule Take 1 capsule 3 x /day for Neuropathy Pain (Patient not taking: Reported on 01/25/2020) 270 capsule 0    Past Medical History:  Diagnosis Date   Dyspnea 2021   with exersion    Early cataracts, bilateral    Elevated hemoglobin A1c    Fibroids    GERD (gastroesophageal reflux disease)    Hyperlipidemia    Hypertension    Lymphoma (HCC)    Osteopenia     Past Surgical History:  Procedure Laterality Date   ABDOMINAL HYSTERECTOMY     APPENDECTOMY     CATARACT EXTRACTION, BILATERAL Bilateral 09/2017  Dr. 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   TONSILLECTOMY AND ADENOIDECTOMY      Social History   Tobacco Use   Smoking status: Never   Smokeless tobacco: Never  Vaping Use   Vaping status: Never Used  Substance Use Topics   Alcohol use: No   Drug use: Never    Family History  Problem Relation Age of Onset   Leukemia Mother    Hypertension Father    Diabetes Sister    Heart disease Brother    Diabetes Brother    Diabetes Brother    Hypertension Son     PE: Vitals:   06/15/23 0100 06/15/23 0145 06/15/23 0251  06/15/23 0540  BP: (!) 159/71  (!) 144/59 (!) 147/67  Pulse: 63 61 63 64  Resp: 14 19 17 18   Temp:   98.2 F (36.8 C) 98 F (36.7 C)  TempSrc:   Oral Oral  SpO2: 100% 100% 100% 100%  Weight:      Height:       Patient appears to be in no acute distress  patient is alert and oriented x3 Atraumatic normocephalic head No cervical or supraclavicular lymphadenopathy appreciated No increased work of breathing, no audible wheezes/rhonchi Regular sinus rhythm/rate Abdomen is soft, nontender, nondistended, no CVA or suprapubic tenderness Lower extremities are symmetric without appreciable edema Grossly neurologically intact No identifiable skin lesions  Recent Labs    06/14/23 1134 06/14/23 1845  WBC 7.8 7.8  HGB 12.6 12.0  HCT 37.4 36.6   Recent Labs    06/14/23 1134 06/14/23 1845  NA 137 139  K 4.1 4.1  CL 102 104  CO2 27 24  GLUCOSE 102 109*  BUN 29* 28*  CREATININE 1.42* 1.61*  CALCIUM 14.9* 14.8*   No results for input(s): "LABPT", "INR" in the last 72 hours. No results for input(s): "LABURIN" in the last 72 hours. Results for orders placed or performed in visit on 02/03/22  MICROSCOPIC MESSAGE     Status: None   Collection Time: 02/03/22  3:58 PM  Result Value Ref Range Status   Note   Final    Comment: This urine was analyzed for the presence of WBC,  RBC, bacteria, casts, and other formed elements.  Only those elements seen were reported. . .     Imaging: IMPRESSION: 1. 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 2. No acute fracture or traumatic malalignment of the lumbar spine.  Imp: 83 year old female with likely traumatic right renal laceration grade 5 secondary to syncopal falls.  Currently patient stable with normal blood levels.  Pain is well-controlled.  Patient is mild AKI likely due to compression of the right kidney from the hematoma.  This point since patient is stable recommend managing stably with observation  trending hematocrit and bedrest for the next 48 hours.  If patient becomes unstable or hematocrit precipitously drops recommend consult IR for embolization.  Elevated calcium: Recommend PTH labs.  AKI: Likely secondary to compression of the right kidney causing decreased perfusion no intervention needed.  Recommendations: Grade 5 renal  laceration: - Trend hematocrit for the next 48 hours every 6-8 hours -Bedrest for the next 48 hours - If hematocrit is precipitously drop or patient becomes unstable recommend consult to IR for embolization. - If patient is stable and doing well and pain is well-managed after 48 hours patient may be discharged home -Urology will set up follow-up in  2 to 3 months with CT scan to see if there is resolution of the hematoma as well as underlying renal mass. -No intervention required from urology at this time. - Urology will see as needed  AKI: - AKI likely from compression of the right kidney causing decreased perfusion continue fluid resuscitation no intervention required.  Elevated calcium:  - Recommend PTH    Thank you for involving me in this patient's care, I will continue to follow along peripherally.Please page with any further questions or concerns. Adonis Brook

## 2023-06-15 NOTE — ED Notes (Signed)
 ED TO INPATIENT HANDOFF REPORT  ED Nurse Name and Phone #: Johnney Killian Name/Age/Gender Jacqueline Hancock 83 y.o. female Room/Bed: 031C/031C  Code Status   Code Status: Prior  Home/SNF/Other Home Patient oriented to: self, place, time, and situation Is this baseline? Yes   Triage Complete: Triage complete  Chief Complaint Hypercalcemia [E83.52]  Triage Note The pt has had 3 falls since yesterday  and she reports no injury to her head she is also c/o dizziness sl nausea  she is also c/o chest pain  The pt is c/o pain in her entire rt side of her body for  2 weeks   Allergies Allergies  Allergen Reactions   Nexium [Esomeprazole Magnesium] Other (See Comments)    Headache   Peanut Butter Flavoring Agent (Non-Screening) Itching    Level of Care/Admitting Diagnosis ED Disposition     ED Disposition  Admit   Condition  --   Comment  Hospital Area: [facility] [[id number]]  Level of Care: Telemetry Medical [104]  May admit patient to [facility] or [facility] if equivalent level of care is available:: No  Covid Evaluation: Asymptomatic - no recent exposure (last 10 days) testing not required  Diagnosis: Hypercalcemia [275.42.ICD-9-CM]  Admitting Physician: Charlsie Quest [[id number]]  Attending Physician: Charlsie Quest [[id number]]  Certification:: I certify this patient will need inpatient services for at least 2 midnights  Expected Medical Readiness: 06/17/2023          B Medical/Surgery History Past Medical History:  Diagnosis Date   Dyspnea 2021   with exersion    Early cataracts, bilateral    Elevated hemoglobin A1c    Fibroids    GERD (gastroesophageal reflux disease)    Hyperlipidemia    Hypertension    Lymphoma (HCC)    Osteopenia    Past Surgical History:  Procedure Laterality Date   ABDOMINAL HYSTERECTOMY     APPENDECTOMY     CATARACT EXTRACTION, BILATERAL Bilateral 09/2017   Dr. 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    TONSILLECTOMY AND ADENOIDECTOMY       A IV Location/Drains/Wounds Patient Lines/Drains/Airways Status     Active Line/Drains/Airways     Name Placement date Placement time Site Days   Peripheral IV 06/14/23 20 G Anterior;Left Forearm 06/14/23  2032  Forearm  1            Intake/Output Last 24 hours No intake or output data in the 24 hours ending 06/15/23 0141  Labs/Imaging Results for orders placed or performed during the hospital encounter of 06/14/23 (from the past 48 hours)  CBC     Status: None   Collection Time: 06/14/23  6:45 PM  Result Value Ref Range   WBC 7.8 4.0 - 10.5 K/uL   RBC 4.00 3.87 - 5.11 MIL/uL   Hemoglobin 12.0 12.0 - 15.0 g/dL   HCT 82.9 56.2 - 13.0 %   MCV 91.5 80.0 - 100.0 fL   MCH 30.0 26.0 - 34.0 pg   MCHC 32.8 30.0 - 36.0 g/dL   RDW 86.5 78.4 - 69.6 %   Platelets 177 150 - 400 K/uL   nRBC 0.0 0.0 - 0.2 %    Comment: Performed at [facility], [address], [place], [place] [address]  Troponin I (High Sensitivity)     Status: Abnormal   Collection Time: 06/14/23  6:45 PM  Result Value Ref Range   Troponin I (High  Sensitivity) 19 (H) <18 ng/L    Comment: (NOTE) Elevated high sensitivity troponin I (hsTnI) values and significant  changes across serial measurements may suggest ACS but many other  chronic and acute conditions are known to elevate hsTnI results.  Refer to the "Links" section for chest pain algorithms and additional  guidance. Performed at [facility], [address], [place], [place] [address]   Comprehensive metabolic panel     Status: Abnormal   Collection Time: 06/14/23  6:45 PM  Result Value Ref Range   Sodium 139 135 - 145 mmol/L   Potassium 4.1 3.5 - 5.1 mmol/L   Chloride 104 98 - 111 mmol/L   CO2 24 22 - 32 mmol/L   Glucose, Bld 109 (H) 70 - 99 mg/dL    Comment: Glucose reference range applies only to samples taken after fasting for at least 8 hours.   BUN 28 (H) 8 - 23 mg/dL   Creatinine, Ser 1.91  (H) 0.44 - 1.00 mg/dL   Calcium 47.8 (HH) 8.9 - 10.3 mg/dL    Comment: CRITICAL RESULT CALLED TO, READ BACK BY AND VERIFIED WITH B. OBRAN RN 06/14/2023 2000 BNUNNERY   Total Protein 6.6 6.5 - 8.1 g/dL   Albumin 3.8 3.5 - 5.0 g/dL   AST 44 (H) 15 - 41 U/L   ALT 48 (H) 0 - 44 U/L   Alkaline Phosphatase 84 38 - 126 U/L   Total Bilirubin 1.1 0.0 - 1.2 mg/dL   GFR, Estimated 32 (L) >60 mL/min    Comment: (NOTE) Calculated using the CKD-EPI Creatinine Equation (2021)    Anion gap 11 5 - 15    Comment: Performed at [facility], [address] [place], [place] [address]  Lipase, blood     Status: None   Collection Time: 06/14/23  6:45 PM  Result Value Ref Range   Lipase 35 11 - 51 U/L    Comment: Performed at [facility], [address], [place], [place] [address]  Urinalysis, w/ Reflex to Culture (Infection Suspected) -Urine, Clean Catch     Status: Abnormal   Collection Time: 06/14/23  9:29 PM  Result Value Ref Range   Specimen Source URINE, CLEAN CATCH    Color, Urine YELLOW YELLOW   APPearance CLEAR CLEAR   Specific Gravity, Urine 1.017 1.005 - 1.030   pH 5.0 5.0 - 8.0   Glucose, UA NEGATIVE NEGATIVE mg/dL   Hgb urine dipstick NEGATIVE NEGATIVE   Bilirubin Urine NEGATIVE NEGATIVE   Ketones, ur NEGATIVE NEGATIVE mg/dL   Protein, ur NEGATIVE NEGATIVE mg/dL   Nitrite NEGATIVE NEGATIVE   Leukocytes,Ua TRACE (A) NEGATIVE   RBC / HPF 0-5 0 - 5 RBC/hpf   WBC, UA 6-10 0 - 5 WBC/hpf    Comment:        Reflex urine culture not performed if WBC <=10, OR if Squamous epithelial cells >5. If Squamous epithelial cells >5 suggest recollection.    Bacteria, UA RARE (A) NONE SEEN   Squamous Epithelial / HPF 0-5 0 - 5 /HPF   Mucus PRESENT     Comment: Performed at [facility], [address] [place], [place] [address]  Troponin I (High Sensitivity)     Status: None   Collection Time: 06/14/23  9:41 PM  Result Value Ref Range   Troponin I (High Sensitivity) 15 <18 ng/L     Comment: (NOTE) Elevated high sensitivity troponin I (hsTnI) values and significant  changes across serial measurements may suggest ACS but many other  chronic and acute conditions are known to elevate hsTnI results.  Refer to the "Links" section for chest pain algorithms and additional  guidance. Performed at [facility], [address], [place], [place] [address]    CT Head Wo Contrast Result Date: 06/14/2023 CLINICAL DATA:  Neck trauma (Age >= 65y); Head trauma, minor (Age >= 65y) fall. EXAM: CT HEAD WITHOUT CONTRAST CT CERVICAL SPINE WITHOUT CONTRAST TECHNIQUE: Multidetector CT imaging of the head and cervical spine was performed following the standard protocol without intravenous contrast. Multiplanar CT image reconstructions of the cervical spine were also generated. RADIATION DOSE REDUCTION: This exam was performed according to the departmental dose-optimization program which includes automated exposure control, adjustment of the mA and/or kV according to patient size and/or use of iterative reconstruction technique. COMPARISON:  CT head 11/11/2013 FINDINGS: CT HEAD FINDINGS Brain: No evidence of large-territorial acute infarction. No parenchymal hemorrhage. No mass lesion. No extra-axial collection. No mass effect or midline shift. No hydrocephalus. Basilar cisterns are patent. Vascular: No hyperdense vessel. Skull: No acute fracture or focal lesion. Sinuses/Orbits: Paranasal sinuses and mastoid air cells are clear. The orbits are unremarkable. Other: None. CT CERVICAL SPINE FINDINGS Alignment: Normal. Skull base and vertebrae: Multilevel severe degenerative changes of the spine with posterior disc osteophyte complex formation and posterior longitudinal ligament calcification at the C4-C5 level. No severe osseous neural foraminal or central canal stenosis. No acute fracture. No aggressive appearing focal osseous lesion or focal pathologic process. Soft tissues and spinal canal: No  prevertebral fluid or swelling. No visible canal hematoma. Upper chest: Unremarkable. Other: None. IMPRESSION: 1. No acute intracranial abnormality. 2. No acute displaced fracture or traumatic listhesis of the cervical spine. Electronically Signed   By: Tish Frederickson M.D.   On: 06/14/2023 23:18   CT Cervical Spine Wo Contrast Result Date: 06/14/2023 CLINICAL DATA:  Neck trauma (Age >= 65y); Head trauma, minor (Age >= 65y) fall. EXAM: CT HEAD WITHOUT CONTRAST CT CERVICAL SPINE WITHOUT CONTRAST TECHNIQUE: Multidetector CT imaging of the head and cervical spine was performed following the standard protocol without intravenous contrast. Multiplanar CT image reconstructions of the cervical spine were also generated. RADIATION DOSE REDUCTION: This exam was performed according to the departmental dose-optimization program which includes automated exposure control, adjustment of the mA and/or kV according to patient size and/or use of iterative reconstruction technique. COMPARISON:  CT head 11/11/2013 FINDINGS: CT HEAD FINDINGS Brain: No evidence of large-territorial acute infarction. No parenchymal hemorrhage. No mass lesion. No extra-axial collection. No mass effect or midline shift. No hydrocephalus. Basilar cisterns are patent. Vascular: No hyperdense vessel. Skull: No acute fracture or focal lesion. Sinuses/Orbits: Paranasal sinuses and mastoid air cells are clear. The orbits are unremarkable. Other: None. CT CERVICAL SPINE FINDINGS Alignment: Normal. Skull base and vertebrae: Multilevel severe degenerative changes of the spine with posterior disc osteophyte complex formation and posterior longitudinal ligament calcification at the C4-C5 level. No severe osseous neural foraminal or central canal stenosis. No acute fracture. No aggressive appearing focal osseous lesion or focal pathologic process. Soft tissues and spinal canal: No prevertebral fluid or swelling. No visible canal hematoma. Upper chest: Unremarkable.  Other: None. IMPRESSION: 1. No acute intracranial abnormality. 2. No acute displaced fracture or traumatic listhesis of the cervical spine. Electronically Signed   By: Tish Frederickson M.D.   On: 06/14/2023 23:18   CT ABDOMEN PELVIS W CONTRAST Result Date: 06/14/2023 CLINICAL DATA:  Abdominal trauma, blunt  The pt has had 3 falls since yesterday and she reports no injury to her head she is also c/o dizziness sl nausea she is also c/o chest pain EXAM: CT ABDOMEN AND PELVIS WITH CONTRAST TECHNIQUE: Multidetector CT imaging of the abdomen and pelvis was performed using the standard protocol following bolus administration of intravenous contrast. RADIATION DOSE REDUCTION: This exam was performed according to the departmental dose-optimization program which includes automated exposure control, adjustment of the mA and/or kV according to patient size and/or use of iterative reconstruction technique. CONTRAST:  60mL OMNIPAQUE IOHEXOL 350 MG/ML SOLN COMPARISON:  None Available. FINDINGS: Lower chest: No acute abnormality.  Small hiatal hernia. Hepatobiliary: Not enlarged. No focal lesion. No laceration or subcapsular hematoma. The gallbladder is otherwise unremarkable with no radio-opaque gallstones. No biliary ductal dilatation. Pancreas: Normal pancreatic contour. No main pancreatic duct dilatation. Spleen: Not enlarged. No focal lesion. No laceration, subcapsular hematoma, or vascular injury. Adrenals/Urinary Tract: No nodularity bilaterally. The left kidney enhances homogeneously. Shattered right superior renal pole with associated 12 x 8 x 16 cm perinephric hematoma. Nonenhancing right superior renal pole. Concern for main renal artery avulsion. No definite extravasation of excreted intravenous contrast on delayed imaging from the collecting system. No definite findings of lumen on delayed imaging to suggest active hemorrhage. No hydroureter. The urinary bladder is unremarkable. Stomach/Bowel: No small or large bowel  wall thickening or dilatation. The appendix is not definitely identified with no inflammatory changes in the right lower quadrant to suggest acute appendicitis. Vasculature/Lymphatics: No abdominal aorta or iliac aneurysm. No active contrast extravasation or pseudoaneurysm. No abdominal, pelvic, inguinal lymphadenopathy. Reproductive: Normal. Other: No simple free fluid ascites. No pneumoperitoneum. No organized fluid collection. Musculoskeletal: No significant soft tissue hematoma. No acute pelvic fracture. No spinal fracture. Other ports and devices: None. IMPRESSION: 1. 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 2. No acute fracture or traumatic malalignment of the lumbar spine. These results were called by telephone at the time of interpretation on 06/14/2023 at 11:10 pm to provider Dr. Fredderick Phenix, who verbally acknowledged these results. Electronically Signed   By: Tish Frederickson M.D.   On: 06/14/2023 23:13   DG Hip Unilat W or Wo Pelvis 2-3 Views Left Result Date: 06/14/2023 CLINICAL DATA:  Pain, recurrent falls. EXAM: DG HIP (WITH OR WITHOUT PELVIS) 2-3V LEFT COMPARISON:  None Available. FINDINGS: No fracture of the pelvis or left hip. No left hip dislocation. Intact pubic rami. Minor acetabular spurring. Pubic symphysis and sacroiliac joints are congruent. IMPRESSION: 1. No fracture or dislocation of the pelvis or left hip. 2. Minor degenerative acetabular spurring. Electronically Signed   By: Narda Rutherford M.D.   On: 06/14/2023 20:10   DG Knee 2 Views Right Result Date: 06/14/2023 CLINICAL DATA:  Pain.  Recurrent falls. EXAM: RIGHT KNEE - 1-2 VIEW COMPARISON:  None Available. FINDINGS: No evidence of fracture, dislocation, or joint effusion. Mild medial tibiofemoral joint space narrowing. Mild patellofemoral spurring. Moderate quadriceps and patellar tendon enthesophytes. IMPRESSION: 1. No fracture or subluxation of the right knee. 2. Mild osteoarthritis. Electronically Signed    By: Narda Rutherford M.D.   On: 06/14/2023 20:09   DG Knee 2 Views Left Result Date: 06/14/2023 CLINICAL DATA:  Pain.  Recurrent falls. EXAM: LEFT KNEE - 1-2 VIEW COMPARISON:  None Available. FINDINGS: No evidence of fracture, dislocation, or joint effusion. Mild medial tibiofemoral joint space narrowing. Mild patellofemoral spurring. Quadriceps and patellar tendon enthesophytes. IMPRESSION: Mild osteoarthritis. No acute fracture  or subluxation. Electronically Signed   By: Narda Rutherford M.D.   On: 06/14/2023 20:08   DG Chest 2 View Result Date: 06/14/2023 CLINICAL DATA:  Recurrent falls. EXAM: CHEST - 2 VIEW COMPARISON:  06/02/2023 FINDINGS: The cardiomediastinal contours are normal. Subpleural changes on prior exam are not as well demonstrated. Pulmonary vasculature is normal. No consolidation, pleural effusion, or pneumothorax. No acute osseous abnormalities are seen. IMPRESSION: No acute findings. Electronically Signed   By: Narda Rutherford M.D.   On: 06/14/2023 20:07    Pending Labs Unresulted Labs (From admission, onward)    None       Vitals/Pain Today's Vitals   06/15/23 0015 06/15/23 0030 06/15/23 0045 06/15/23 0100  BP: (!) 178/78 (!) 175/79 (!) 160/85 (!) 159/71  Pulse: 64 62 60 63  Resp: 20 15 16 14   Temp:      TempSrc:      SpO2: 99% 100% 99% 100%  Weight:      Height:      PainSc:        Isolation Precautions No active isolations  Medications Medications  sodium chloride 0.9 % bolus 500 mL (500 mLs Intravenous New Bag/Given 06/14/23 2131)  iohexol (OMNIPAQUE) 350 MG/ML injection 60 mL (60 mLs Intravenous Contrast Given 06/14/23 2146)    Mobility walks     Focused Assessments Cardiac Assessment Handoff:    Lab Results  Component Value Date   CKTOTAL 190 (H) 02/21/2023   CKMB 4.7 02/21/2023   No results found for: "DDIMER" Does the Patient currently have chest pain? No    R Recommendations: See Admitting Provider Note  Report given to:    Additional Notes:

## 2023-06-16 DIAGNOSIS — N1832 Chronic kidney disease, stage 3b: Secondary | ICD-10-CM

## 2023-06-16 DIAGNOSIS — R531 Weakness: Secondary | ICD-10-CM

## 2023-06-16 DIAGNOSIS — S37031A Laceration of right kidney, unspecified degree, initial encounter: Secondary | ICD-10-CM | POA: Diagnosis not present

## 2023-06-16 DIAGNOSIS — J849 Interstitial pulmonary disease, unspecified: Secondary | ICD-10-CM

## 2023-06-16 LAB — COMPREHENSIVE METABOLIC PANEL
ALT: 30 U/L (ref 0–44)
AST: 26 U/L (ref 15–41)
Albumin: 3.2 g/dL — ABNORMAL LOW (ref 3.5–5.0)
Alkaline Phosphatase: 61 U/L (ref 38–126)
Anion gap: 11 (ref 5–15)
BUN: 20 mg/dL (ref 8–23)
CO2: 20 mmol/L — ABNORMAL LOW (ref 22–32)
Calcium: 11.4 mg/dL — ABNORMAL HIGH (ref 8.9–10.3)
Chloride: 110 mmol/L (ref 98–111)
Creatinine, Ser: 1.43 mg/dL — ABNORMAL HIGH (ref 0.44–1.00)
GFR, Estimated: 37 mL/min — ABNORMAL LOW (ref >60)
Glucose, Bld: 89 mg/dL (ref 70–99)
Potassium: 4 mmol/L (ref 3.5–5.1)
Sodium: 141 mmol/L (ref 135–145)
Total Bilirubin: 1.2 mg/dL (ref 0.0–1.2)
Total Protein: 5.6 g/dL — ABNORMAL LOW (ref 6.5–8.1)

## 2023-06-16 LAB — HEMOGLOBIN AND HEMATOCRIT, BLOOD
HCT: 34.8 % — ABNORMAL LOW (ref 36.0–46.0)
HCT: 31.7 % — ABNORMAL LOW (ref 36.0–46.0)
HCT: 32.8 % — ABNORMAL LOW (ref 36.0–46.0)
Hemoglobin: 11.4 g/dL — ABNORMAL LOW (ref 12.0–15.0)
Hemoglobin: 10.6 g/dL — ABNORMAL LOW (ref 12.0–15.0)
Hemoglobin: 11.1 g/dL — ABNORMAL LOW (ref 12.0–15.0)

## 2023-06-16 NOTE — Plan of Care (Signed)

## 2023-06-16 NOTE — Progress Notes (Signed)
 OT Cancellation Note  Patient Details Name: SHANDREA LUSK MRN: [id number] DOB: 1940-09-27   Cancelled Treatment:    Reason Eval/Treat Not Completed: Active bedrest order  Suzanna Obey 06/16/2023, 2:34 PM 06/16/2023  RP, OTR/L  Acute Rehabilitation Services  Office:  [phone]

## 2023-06-16 NOTE — Plan of Care (Signed)

## 2023-06-16 NOTE — Progress Notes (Signed)
 PT Cancellation Note  Patient Details Name: Jacqueline Hancock MRN: [id number] DOB: 25-Apr-1940   Cancelled Treatment:    Reason Eval/Treat Not Completed: Patient not medically ready PT orders received, chart reviewed. Per urology notes, pt on bedrest x 48 hours with attending recommending to hold until after this (despite PT orders being placed). Will f/u as able & once bedrest ends.  Aleda Grana, PT, DPT 06/16/23, 10:55 AM   Sandi Mariscal 06/16/2023, 10:55 AM

## 2023-06-16 NOTE — Progress Notes (Signed)
 [facility]                                                                              Jacqueline Hancock, is a 83 y.o. female, DOB - 1940-10-02, QMV:[id number] Admit date - 06/14/2023    Outpatient Primary MD for the patient is Jacqueline, Donalee Citrin, NP  LOS - 1  days  Chief Complaint  Patient presents with   Chest Pain       Brief summary   Patient is a 83 year old female with CLL, ILD, CKD stage IIIb, HTN, hyperlipidemia presented to ED after falling at home.  Patient reported that she has fallen several times at home in the last couple of weeks. She says when she is walking she feels like her knees locked up which has resulted in her falling. She has been having pain to her right flank area since 1 of these falls but otherwise no obvious injury. She does note feeling somewhat confused at times as well recently. She reports new constipation as well.  In ED, hemoglobin 12.0, calcium 14.8, AST 44, ALT 48, alk phos 84.  UA negative for UTI Extensive trauma imaging performed and notable for grade 5 right renal injury with 12 x 8 x 16 cm perinephric hematoma, otherwise no acute injury or pathology.   Patient was given 500 cc normal saline.  EDP discussed with urology Dr. Jennette Bill who recommended medical admission, trend H&H, and if patient becomes unstable, then next step would be IR for embolization.    Assessment & Plan    Principal Problem:   Hypercalcemia -Corrected calcium 15.0 on admission, patient has been taking calcium supplements at home -Vitamin D, PTH, PTH related peptide in process -Received IV fluids, Zometa/calcitonin.  Currently on calcitonin IM -Calcium improving, continue IV fluids  Active Problems:   Traumatic perinephric hematoma of right side, grade 5 renal laceration -Right flank pain has been improving, H&H remains stable, -Continue to monitor H&H closely, low threshold for repeating the imaging and IR for embolization if H&H precipitously drops or  worsening pain -Outpatient follow-up with urology for repeat imaging to assess resolution of hematoma and to rule out any underlying mass -No urological intervention at this time    Hypertension -BP stable, continue nadolol, amlodipine  Acute on chronic kidney disease, stage 3b (HCC) -Baseline creatinine has been 1.3-1.4 -Presented with creatinine of 1.61, continue IV fluid hydration, creatinine improving, close to baseline    ILD (interstitial lung disease) (HCC) -No acute issues, follows Dr. Marchelle Gearing outpatient    CLL (chronic lymphocytic leukemia) (HCC) -Follows with Dr. Candise Che, currently monitoring of ibrutinib  Generalized debility, falls -PT OT evaluation today  Obesity class I Estimated body mass index is 31.93 kg/m as calculated from the following:   Height as of this encounter: 4\' 11"  (1.499 m).   Weight as of this encounter: 71.7 kg.  Code Status: Full CODE STATUS DVT Prophylaxis:  SCDs Start: 06/15/23 0144   Level of Care: Level of care: Telemetry Medical Family Communication: Updated patient's son at the bedside Disposition Plan:      Remains inpatient appropriate: Hopefully DC home in 24 to 48 hours  if H&H remains stable, hypercalcemia improving, no acute issues   Procedures:    Consultants:   Urology  Antimicrobials:   Anti-infectives (From admission, onward)    None          Medications  amLODipine  5 mg Oral Daily   calcitonin  4 Units/kg Intramuscular BID   nadolol  20 mg Oral Daily   sodium chloride flush  3 mL Intravenous Q12H      Subjective:   Jacqueline Hancock was seen and examined today.  Still having some right-sided flank pain but improving, no acute bleeding.  Patient denies dizziness, chest pain, shortness of breath, N/V/D/C, new weakness, numbess, tingling. No acute events overnight.  Son at the bedside.  Objective:   Vitals:   06/15/23 0540 06/15/23 1539 06/15/23 2009 06/16/23 0852  BP: (!) 147/67 (!) 136/50 (!) 132/58 (!)  139/58  Pulse: 64 62 61 63  Resp: 18 18 18 18   Temp: 98 F (36.7 C) 98.3 F (36.8 C) 98 F (36.7 C) 97.8 F (36.6 C)  TempSrc: Oral  Oral Oral  SpO2: 100% 99% 99% 99%  Weight:      Height:        Intake/Output Summary (Last 24 hours) at 06/16/2023 1032 Last data filed at 06/16/2023 0617 Gross per 24 hour  Intake 70 ml  Output 950 ml  Net -880 ml     Wt Readings from Last 3 Encounters:  06/14/23 71.7 kg  06/14/23 71.7 kg  06/05/23 70.3 kg     Exam General: Alert and oriented x 3, NAD Cardiovascular: S1 S2 auscultated,  RRR Respiratory: Clear to auscultation bilaterally, no wheezing Gastrointestinal: Soft, nondistended, + bowel sounds, mild right flank TTP Ext: no pedal edema bilaterally Neuro: no new deficits Psych: Normal affect     Data Reviewed:  I have personally reviewed following labs    CBC Lab Results  Component Value Date   WBC 6.0 06/15/2023   RBC 3.63 (L) 06/15/2023   HGB 11.4 (L) 06/16/2023   HCT 34.8 (L) 06/16/2023   MCV 91.5 06/15/2023   MCH 30.3 06/15/2023   PLT 135 (L) 06/15/2023   MCHC 33.1 06/15/2023   RDW 11.9 06/15/2023   LYMPHSABS 2.2 03/14/2023   MONOABS 0.6 03/14/2023   EOSABS 133 06/14/2023   BASOSABS 70 06/14/2023     Last metabolic panel Lab Results  Component Value Date   NA 141 06/16/2023   K 4.0 06/16/2023   CL 110 06/16/2023   CO2 20 (L) 06/16/2023   BUN 20 06/16/2023   CREATININE 1.43 (H) 06/16/2023   GLUCOSE 89 06/16/2023   GFRNONAA 37 (L) 06/16/2023   GFRAA 52 (L) 08/07/2020   CALCIUM 11.4 (H) 06/16/2023   PROT 5.6 (L) 06/16/2023   ALBUMIN 3.2 (L) 06/16/2023   BILITOT 1.2 06/16/2023   ALKPHOS 61 06/16/2023   AST 26 06/16/2023   ALT 30 06/16/2023   ANIONGAP 11 06/16/2023    CBG (last 3)  No results for input(s): "GLUCAP" in the last 72 hours.    Coagulation Profile: No results for input(s): "INR", "PROTIME" in the last 168 hours.   Radiology Studies: I have personally reviewed the imaging studies   CT Head Wo Contrast Result Date: 06/14/2023 CLINICAL DATA:  Neck trauma (Age >= 65y); Head trauma, minor (Age >= 65y) fall. EXAM: CT HEAD WITHOUT CONTRAST CT CERVICAL SPINE WITHOUT CONTRAST TECHNIQUE: Multidetector CT imaging of the head and cervical spine was performed following the standard protocol without intravenous contrast.  Multiplanar CT image reconstructions of the cervical spine were also generated. RADIATION DOSE REDUCTION: This exam was performed according to the departmental dose-optimization program which includes automated exposure control, adjustment of the mA and/or kV according to patient size and/or use of iterative reconstruction technique. COMPARISON:  CT head 11/11/2013 FINDINGS: CT HEAD FINDINGS Brain: No evidence of large-territorial acute infarction. No parenchymal hemorrhage. No mass lesion. No extra-axial collection. No mass effect or midline shift. No hydrocephalus. Basilar cisterns are patent. Vascular: No hyperdense vessel. Skull: No acute fracture or focal lesion. Sinuses/Orbits: Paranasal sinuses and mastoid air cells are clear. The orbits are unremarkable. Other: None. CT CERVICAL SPINE FINDINGS Alignment: Normal. Skull base and vertebrae: Multilevel severe degenerative changes of the spine with posterior disc osteophyte complex formation and posterior longitudinal ligament calcification at the C4-C5 level. No severe osseous neural foraminal or central canal stenosis. No acute fracture. No aggressive appearing focal osseous lesion or focal pathologic process. Soft tissues and spinal canal: No prevertebral fluid or swelling. No visible canal hematoma. Upper chest: Unremarkable. Other: None. IMPRESSION: 1. No acute intracranial abnormality. 2. No acute displaced fracture or traumatic listhesis of the cervical spine. Electronically Signed   By: Tish Frederickson M.D.   On: 06/14/2023 23:18   CT Cervical Spine Wo Contrast Result Date: 06/14/2023 CLINICAL DATA:  Neck trauma (Age >=  65y); Head trauma, minor (Age >= 65y) fall. EXAM: CT HEAD WITHOUT CONTRAST CT CERVICAL SPINE WITHOUT CONTRAST TECHNIQUE: Multidetector CT imaging of the head and cervical spine was performed following the standard protocol without intravenous contrast. Multiplanar CT image reconstructions of the cervical spine were also generated. RADIATION DOSE REDUCTION: This exam was performed according to the departmental dose-optimization program which includes automated exposure control, adjustment of the mA and/or kV according to patient size and/or use of iterative reconstruction technique. COMPARISON:  CT head 11/11/2013 FINDINGS: CT HEAD FINDINGS Brain: No evidence of large-territorial acute infarction. No parenchymal hemorrhage. No mass lesion. No extra-axial collection. No mass effect or midline shift. No hydrocephalus. Basilar cisterns are patent. Vascular: No hyperdense vessel. Skull: No acute fracture or focal lesion. Sinuses/Orbits: Paranasal sinuses and mastoid air cells are clear. The orbits are unremarkable. Other: None. CT CERVICAL SPINE FINDINGS Alignment: Normal. Skull base and vertebrae: Multilevel severe degenerative changes of the spine with posterior disc osteophyte complex formation and posterior longitudinal ligament calcification at the C4-C5 level. No severe osseous neural foraminal or central canal stenosis. No acute fracture. No aggressive appearing focal osseous lesion or focal pathologic process. Soft tissues and spinal canal: No prevertebral fluid or swelling. No visible canal hematoma. Upper chest: Unremarkable. Other: None. IMPRESSION: 1. No acute intracranial abnormality. 2. No acute displaced fracture or traumatic listhesis of the cervical spine. Electronically Signed   By: Tish Frederickson M.D.   On: 06/14/2023 23:18   CT ABDOMEN PELVIS W CONTRAST Result Date: 06/14/2023 CLINICAL DATA:  Abdominal trauma, blunt The pt has had 3 falls since yesterday and she reports no injury to her head she  is also c/o dizziness sl nausea she is also c/o chest pain EXAM: CT ABDOMEN AND PELVIS WITH CONTRAST TECHNIQUE: Multidetector CT imaging of the abdomen and pelvis was performed using the standard protocol following bolus administration of intravenous contrast. RADIATION DOSE REDUCTION: This exam was performed according to the departmental dose-optimization program which includes automated exposure control, adjustment of the mA and/or kV according to patient size and/or use of iterative reconstruction technique. CONTRAST:  60mL OMNIPAQUE IOHEXOL 350 MG/ML SOLN COMPARISON:  None  Available. FINDINGS: Lower chest: No acute abnormality.  Small hiatal hernia. Hepatobiliary: Not enlarged. No focal lesion. No laceration or subcapsular hematoma. The gallbladder is otherwise unremarkable with no radio-opaque gallstones. No biliary ductal dilatation. Pancreas: Normal pancreatic contour. No main pancreatic duct dilatation. Spleen: Not enlarged. No focal lesion. No laceration, subcapsular hematoma, or vascular injury. Adrenals/Urinary Tract: No nodularity bilaterally. The left kidney enhances homogeneously. Shattered right superior renal pole with associated 12 x 8 x 16 cm perinephric hematoma. Nonenhancing right superior renal pole. Concern for main renal artery avulsion. No definite extravasation of excreted intravenous contrast on delayed imaging from the collecting system. No definite findings of lumen on delayed imaging to suggest active hemorrhage. No hydroureter. The urinary bladder is unremarkable. Stomach/Bowel: No small or large bowel wall thickening or dilatation. The appendix is not definitely identified with no inflammatory changes in the right lower quadrant to suggest acute appendicitis. Vasculature/Lymphatics: No abdominal aorta or iliac aneurysm. No active contrast extravasation or pseudoaneurysm. No abdominal, pelvic, inguinal lymphadenopathy. Reproductive: Normal. Other: No simple free fluid ascites. No  pneumoperitoneum. No organized fluid collection. Musculoskeletal: No significant soft tissue hematoma. No acute pelvic fracture. No spinal fracture. Other ports and devices: None. IMPRESSION: 1. 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 2. No acute fracture or traumatic malalignment of the lumbar spine. These results were called by telephone at the time of interpretation on 06/14/2023 at 11:10 pm to provider Dr. Fredderick Phenix, who verbally acknowledged these results. Electronically Signed   By: Tish Frederickson M.D.   On: 06/14/2023 23:13   DG Hip Unilat W or Wo Pelvis 2-3 Views Left Result Date: 06/14/2023 CLINICAL DATA:  Pain, recurrent falls. EXAM: DG HIP (WITH OR WITHOUT PELVIS) 2-3V LEFT COMPARISON:  None Available. FINDINGS: No fracture of the pelvis or left hip. No left hip dislocation. Intact pubic rami. Minor acetabular spurring. Pubic symphysis and sacroiliac joints are congruent. IMPRESSION: 1. No fracture or dislocation of the pelvis or left hip. 2. Minor degenerative acetabular spurring. Electronically Signed   By: Narda Rutherford M.D.   On: 06/14/2023 20:10   DG Knee 2 Views Right Result Date: 06/14/2023 CLINICAL DATA:  Pain.  Recurrent falls. EXAM: RIGHT KNEE - 1-2 VIEW COMPARISON:  None Available. FINDINGS: No evidence of fracture, dislocation, or joint effusion. Mild medial tibiofemoral joint space narrowing. Mild patellofemoral spurring. Moderate quadriceps and patellar tendon enthesophytes. IMPRESSION: 1. No fracture or subluxation of the right knee. 2. Mild osteoarthritis. Electronically Signed   By: Narda Rutherford M.D.   On: 06/14/2023 20:09   DG Knee 2 Views Left Result Date: 06/14/2023 CLINICAL DATA:  Pain.  Recurrent falls. EXAM: LEFT KNEE - 1-2 VIEW COMPARISON:  None Available. FINDINGS: No evidence of fracture, dislocation, or joint effusion. Mild medial tibiofemoral joint space narrowing. Mild patellofemoral spurring. Quadriceps and patellar tendon  enthesophytes. IMPRESSION: Mild osteoarthritis. No acute fracture or subluxation. Electronically Signed   By: Narda Rutherford M.D.   On: 06/14/2023 20:08   DG Chest 2 View Result Date: 06/14/2023 CLINICAL DATA:  Recurrent falls. EXAM: CHEST - 2 VIEW COMPARISON:  06/02/2023 FINDINGS: The cardiomediastinal contours are normal. Subpleural changes on prior exam are not as well demonstrated. Pulmonary vasculature is normal. No consolidation, pleural effusion, or pneumothorax. No acute osseous abnormalities are seen. IMPRESSION: No acute findings. Electronically Signed   By: Narda Rutherford M.D.   On: 06/14/2023 20:07       Savvas Roper M.D. [facility] 06/16/2023, 10:32 AM  Available via Epic secure chat 7am-7pm After 7 pm, please refer to night coverage provider listed on amion.

## 2023-06-17 ENCOUNTER — Inpatient Hospital Stay (HOSPITAL_COMMUNITY)

## 2023-06-17 DIAGNOSIS — S37031A Laceration of right kidney, unspecified degree, initial encounter: Secondary | ICD-10-CM | POA: Diagnosis not present

## 2023-06-17 DIAGNOSIS — R531 Weakness: Secondary | ICD-10-CM | POA: Diagnosis not present

## 2023-06-17 DIAGNOSIS — N1832 Chronic kidney disease, stage 3b: Secondary | ICD-10-CM | POA: Diagnosis not present

## 2023-06-17 LAB — RENAL FUNCTION PANEL
Albumin: 3.1 g/dL — ABNORMAL LOW (ref 3.5–5.0)
Anion gap: 8 (ref 5–15)
BUN: 22 mg/dL (ref 8–23)
CO2: 23 mmol/L (ref 22–32)
Calcium: 10.8 mg/dL — ABNORMAL HIGH (ref 8.9–10.3)
Chloride: 111 mmol/L (ref 98–111)
Creatinine, Ser: 1.51 mg/dL — ABNORMAL HIGH (ref 0.44–1.00)
GFR, Estimated: 34 mL/min — ABNORMAL LOW (ref >60)
Glucose, Bld: 93 mg/dL (ref 70–99)
Phosphorus: 3.2 mg/dL (ref 2.5–4.6)
Potassium: 3.9 mmol/L (ref 3.5–5.1)
Sodium: 142 mmol/L (ref 135–145)

## 2023-06-17 LAB — CBC
HCT: 32.8 % — ABNORMAL LOW (ref 36.0–46.0)
Hemoglobin: 11.1 g/dL — ABNORMAL LOW (ref 12.0–15.0)
MCH: 30.7 pg (ref 26.0–34.0)
MCHC: 33.8 g/dL (ref 30.0–36.0)
MCV: 90.9 fL (ref 80.0–100.0)
Platelets: 138 10*3/uL — ABNORMAL LOW (ref 150–400)
RBC: 3.61 MIL/uL — ABNORMAL LOW (ref 3.87–5.11)
RDW: 12 % (ref 11.5–15.5)
WBC: 11.4 10*3/uL — ABNORMAL HIGH (ref 4.0–10.5)
nRBC: 0 % (ref 0.0–0.2)

## 2023-06-17 LAB — PTH, INTACT AND CALCIUM
Calcium, Total (PTH): 12.8 mg/dL — ABNORMAL HIGH (ref 8.7–10.3)
PTH: 9 pg/mL — ABNORMAL LOW (ref 15–65)

## 2023-06-17 MED ORDER — POLYETHYLENE GLYCOL 3350 17 G PO PACK
17.0000 g | PACK | Freq: Every day | ORAL | Status: DC
  Administered 2023-06-17: 17 g via ORAL
  Filled 2023-06-17: qty 1

## 2023-06-17 MED ORDER — SENNOSIDES-DOCUSATE SODIUM 8.6-50 MG PO TABS
1.0000 | ORAL_TABLET | Freq: Two times a day (BID) | ORAL | Status: DC
  Administered 2023-06-17: 1 via ORAL
  Filled 2023-06-17: qty 1

## 2023-06-17 MED ORDER — LACTATED RINGERS IV SOLN: INTRAVENOUS | Status: AC

## 2023-06-17 MED ORDER — BISACODYL 5 MG PO TBEC: 5.0000 mg | DELAYED_RELEASE_TABLET | Freq: Once | ORAL | Status: DC

## 2023-06-17 NOTE — Progress Notes (Signed)
 Hemoglobin stable - okay to liberate activity Will schedule f/u w/ Dr. Jennette Bill in 2-3 months. Will see PRN.

## 2023-06-17 NOTE — Progress Notes (Addendum)
 [facility]                                                                              Jacqueline Hancock, is a 83 y.o. female, DOB - Aug 06, 1940, HQI:[id number] Admit date - 06/14/2023    Outpatient Primary MD for the patient is Ngetich, Donalee Citrin, NP  LOS - 2  days  Chief Complaint  Patient presents with   Chest Pain       Brief summary   Patient is a 83 year old female with CLL, ILD, CKD stage IIIb, HTN, hyperlipidemia presented to ED after falling at home.  Patient reported that she has fallen several times at home in the last couple of weeks. She says when she is walking she feels like her knees locked up which has resulted in her falling. She has been having pain to her right flank area since 1 of these falls but otherwise no obvious injury. She does note feeling somewhat confused at times as well recently. She reports new constipation as well.  In ED, hemoglobin 12.0, calcium 14.8, AST 44, ALT 48, alk phos 84.  UA negative for UTI Extensive trauma imaging performed and notable for grade 5 right renal injury with 12 x 8 x 16 cm perinephric hematoma, otherwise no acute injury or pathology.   Patient was given 500 cc normal saline.  EDP discussed with urology Dr. Jennette Bill who recommended medical admission, trend H&H, and if patient becomes unstable, then next step would be IR for embolization.    Assessment & Plan    Principal Problem:   Hypercalcemia -Corrected calcium 15.0 on admission, patient has been taking calcium supplements at home -Vitamin D pending, PTH 9, PTH related peptide in process -Received IV fluids, Zometa/calcitonin.  Currently on calcitonin IM -Calcium improving, 10.8  Active Problems:   Traumatic perinephric hematoma of right side, grade 5 renal laceration -Right flank pain has been improving, H&H remains stable, -Continue to monitor H&H closely, low threshold for repeating the imaging and IR for embolization if H&H precipitously drops or  worsening pain -Outpatient follow-up with urology for repeat imaging to assess resolution of hematoma and to rule out any underlying mass -No urological intervention at this time -H&H remained stable, 11.1, abdominal pain improved    Hypertension -BP stable, continue amlodipine, nadolol.    Acute on chronic kidney disease, stage 3b (HCC) -Baseline creatinine has been 1.3-1.4 -Creatinine 1.5, will continue IV fluids today as patient is not eating very well.    Nausea vomiting -Likely due to constipation, last BM 3 days ago -X-ray negative for ileus or obstruction, placed on bowel regimen, subsequently had a BM today    ILD (interstitial lung disease) (HCC) -No acute issues, follows Dr. Marchelle Gearing outpatient    CLL (chronic lymphocytic leukemia) (HCC) -Follows with Dr. Candise Che, currently monitoring of ibrutinib  Generalized debility, falls -PT OT evaluation today  Obesity class I Estimated body mass index is 31.93 kg/m as calculated from the following:   Height as of this encounter: 4\' 11"  (1.499 m).   Weight as of this encounter: 71.7 kg.  Code Status: Full CODE STATUS DVT Prophylaxis:  SCDs Start:  06/15/23 0144   Level of Care: Level of care: Telemetry Medical Family Communication: Updated patient's niece at the bedside and son on the phone Disposition Plan:      Remains inpatient appropriate: Hopefully DC home tomorrow.    Procedures:    Consultants:   Urology  Antimicrobials:   Anti-infectives (From admission, onward)    None          Medications  amLODipine  5 mg Oral Daily   bisacodyl  5 mg Oral Once   nadolol  20 mg Oral Daily   polyethylene glycol  17 g Oral Daily   senna-docusate  1 tablet Oral BID   sodium chloride flush  3 mL Intravenous Q12H      Subjective:   Jacqueline Hancock was seen and examined today.  Right-sided flank pain has improved however having nausea and vomiting, last BM 3 days ago. Niece at the bedside.  No chest pain, shortness of  breath, fevers or chills.  Objective:   Vitals:   06/16/23 2042 06/17/23 0515 06/17/23 0816 06/17/23 0821  BP: (!) 142/57 (!) 140/56 (!) 136/57 (!) 136/57  Pulse: 62 60 62   Resp: 16 16 18    Temp: 98.4 F (36.9 C) 98.4 F (36.9 C)    TempSrc: Oral Oral    SpO2: 99% 98% 100%   Weight:      Height:       No intake or output data in the 24 hours ending 06/17/23 1337    Wt Readings from Last 3 Encounters:  06/14/23 71.7 kg  06/14/23 71.7 kg  06/05/23 70.3 kg    Physical Exam General: Alert and oriented x 3, NAD Cardiovascular: S1 S2 clear, RRR.  Respiratory: CTAB, no wheezing Gastrointestinal: Soft, nontender, nondistended, NBS Ext: no pedal edema bilaterally Neuro: no new deficits Psych: Normal affect     Data Reviewed:  I have personally reviewed following labs    CBC Lab Results  Component Value Date   WBC 11.4 (H) 06/17/2023   RBC 3.61 (L) 06/17/2023   HGB 11.1 (L) 06/17/2023   HCT 32.8 (L) 06/17/2023   MCV 90.9 06/17/2023   MCH 30.7 06/17/2023   PLT 138 (L) 06/17/2023   MCHC 33.8 06/17/2023   RDW 12.0 06/17/2023   LYMPHSABS 2.2 03/14/2023   MONOABS 0.6 03/14/2023   EOSABS 133 06/14/2023   BASOSABS 70 06/14/2023     Last metabolic panel Lab Results  Component Value Date   NA 142 06/17/2023   K 3.9 06/17/2023   CL 111 06/17/2023   CO2 23 06/17/2023   BUN 22 06/17/2023   CREATININE 1.51 (H) 06/17/2023   GLUCOSE 93 06/17/2023   GFRNONAA 34 (L) 06/17/2023   GFRAA 52 (L) 08/07/2020   CALCIUM 10.8 (H) 06/17/2023   PHOS 3.2 06/17/2023   PROT 5.6 (L) 06/16/2023   ALBUMIN 3.1 (L) 06/17/2023   BILITOT 1.2 06/16/2023   ALKPHOS 61 06/16/2023   AST 26 06/16/2023   ALT 30 06/16/2023   ANIONGAP 8 06/17/2023    CBG (last 3)  No results for input(s): "GLUCAP" in the last 72 hours.    Coagulation Profile: No results for input(s): "INR", "PROTIME" in the last 168 hours.   Radiology Studies: I have personally reviewed the imaging studies  DG Abd  Portable 1V Result Date: 06/17/2023 CLINICAL DATA:  Ileus. EXAM: PORTABLE ABDOMEN - 1 VIEW COMPARISON:  CT, 06/14/2023. FINDINGS: Normal bowel gas pattern. Specifically, no evidence of obstruction or significant adynamic ileus. Rounded calcification  projects to the left of the L1-L2 disc interspace reflecting the collecting system stone noted on the CT from 06/14/2023. Skeletal structures are unremarkable. IMPRESSION: 1. No acute findings. No evidence of bowel obstruction or significant adynamic ileus. 2. Left upper quadrant calcification consistent with a left renal pelvis stone. Electronically Signed   By: Amie Portland M.D.   On: 06/17/2023 09:25       Brendalee Matthies M.D. [facility] 06/17/2023, 1:37 PM  Available via Epic secure chat 7am-7pm After 7 pm, please refer to night coverage provider listed on amion.

## 2023-06-17 NOTE — Evaluation (Addendum)
 Occupational Therapy Evaluation Patient Details Name: Jacqueline Hancock MRN: [id number] DOB: [birth year] Today's Date: 06/17/2023   History of Present Illness   Pt is an 83 y/o F presenting to ED on 3/12 following a fall at home; pt notes multiple falls over the last several weeks, admittedf or hypercalcemia. Imaging notable for grade 5 right renal injury with 12 x 8 x 16 cm perinephric hematoma. PMH: CLL, ILD, CKD 3B, HTN, HLD, lymphoma     Clinical Impressions Pt ind at baseline with ADLs and functional mobility, lives alone but reports family is coming to stay with her for ~1 month at d/c. Pt reports multiple falls at home due to unsteadiness. Pt currently needing up to min A for ADLs, and CGA for transfers with RW. Discussed use of shower seat for home for safety and pt verbalized understanding. Pt presenting with impairments listed below, will follow acutely. Recommend HHOT at d/c.      If plan is discharge home, recommend the following:   A little help with walking and/or transfers;A little help with bathing/dressing/bathroom;Assistance with cooking/housework;Direct supervision/assist for medications management;Direct supervision/assist for financial management;Assist for transportation;Help with stairs or ramp for entrance     Functional Status Assessment   Patient has had a recent decline in their functional status and demonstrates the ability to make significant improvements in function in a reasonable and predictable amount of time.     Equipment Recommendations   Tub/shower seat     Recommendations for Other Services   PT consult     Precautions/Restrictions   Precautions Precautions: Fall Restrictions Weight Bearing Restrictions Per Provider Order: No     Mobility Bed Mobility               General bed mobility comments: OOB in recliner upon arrival an departure    Transfers Overall transfer level: Needs assistance Equipment used: Rolling walker (2  wheels) Transfers: Sit to/from Stand Sit to Stand: Contact guard assist                  Balance Overall balance assessment: Needs assistance Sitting-balance support: Feet supported Sitting balance-Leahy Scale: Good Sitting balance - Comments: reaches down toward feet without LOB   Standing balance support: During functional activity, Reliant on assistive device for balance Standing balance-Leahy Scale: Poor Standing balance comment: RW in standing                           ADL either performed or assessed with clinical judgement   ADL Overall ADL's : Needs assistance/impaired Eating/Feeding: Set up   Grooming: Set up;Wash/dry hands;Standing   Upper Body Bathing: Minimal assistance   Lower Body Bathing: Minimal assistance   Upper Body Dressing : Minimal assistance   Lower Body Dressing: Minimal assistance   Toilet Transfer: Contact guard assist;Ambulation;Rolling walker (2 wheels)   Toileting- Clothing Manipulation and Hygiene: Contact guard assist       Functional mobility during ADLs: Contact guard assist;Rolling walker (2 wheels)       Vision   Vision Assessment?: No apparent visual deficits     Perception Perception: Not tested       Praxis Praxis: Not tested       Pertinent Vitals/Pain Pain Assessment Pain Assessment: Faces Pain Score: 2  Faces Pain Scale: Hurts little more Pain Location: R side Pain Descriptors / Indicators: Discomfort Pain Intervention(s): Limited activity within patient's tolerance, Monitored during session, Repositioned     Extremity/Trunk Assessment  Upper Extremity Assessment Upper Extremity Assessment: Generalized weakness   Lower Extremity Assessment Lower Extremity Assessment: Defer to PT evaluation   Cervical / Trunk Assessment Cervical / Trunk Assessment: Normal   Communication Communication Communication: No apparent difficulties   Cognition Arousal: Alert Behavior During Therapy: WFL for  tasks assessed/performed Cognition: No apparent impairments                               Following commands: Intact       Cueing  General Comments   Cueing Techniques: Verbal cues  VSS   Exercises     Shoulder Instructions      Home Living Family/patient expects to be discharged to:: Private residence Living Arrangements: Alone Available Help at Discharge: Family;Other (Comment) (son from [place] is coming to stay with her for 1 month) Type of Home: House Home Access: Stairs to enter [employer] of Steps: 2   Home Layout: One level     Bathroom Shower/Tub: Walk-in shower         Home Equipment: Agricultural consultant (2 wheels)          Prior Functioning/Environment Prior Level of Function : Independent/Modified Independent;Driving             Mobility Comments: no AD use; used RW day of admission/day before admission ADLs Comments: no AD use;    OT Problem List: Decreased strength;Decreased range of motion;Decreased activity tolerance;Impaired balance (sitting and/or standing)   OT Treatment/Interventions: Self-care/ADL training;Therapeutic exercise;Energy conservation;DME and/or AE instruction;Therapeutic activities;Patient/family education;Balance training      OT Goals(Current goals can be found in the care plan section)   Acute Rehab OT Goals Patient Stated Goal: did not state OT Goal Formulation: With patient Time For Goal Achievement: 07/01/23 Potential to Achieve Goals: Good ADL Goals Pt Will Perform Upper Body Dressing: Independently;sitting Pt Will Perform Lower Body Dressing: Independently;sitting/lateral leans;sit to/from stand Pt Will Transfer to Toilet: Independently;ambulating;regular height toilet Pt Will Perform Tub/Shower Transfer: Shower transfer;ambulating;shower seat   OT Frequency:  Min 2X/week    Co-evaluation              AM-PAC OT "6 Clicks" Daily Activity     Outcome Measure Help from another  person eating meals?: A Little Help from another person taking care of personal grooming?: A Little Help from another person toileting, which includes using toliet, bedpan, or urinal?: A Little Help from another person bathing (including washing, rinsing, drying)?: A Little Help from another person to put on and taking off regular upper body clothing?: A Little Help from another person to put on and taking off regular lower body clothing?: A Little 6 Click Score: 18   End of Session Equipment Utilized During Treatment: Gait belt;Rolling walker (2 wheels) Nurse Communication: Mobility status; OOB mobility cleared with MD  Activity Tolerance: Patient tolerated treatment well Patient left: in bed;with call bell/phone within reach (seated EOB, handoff to PT)  OT Visit Diagnosis: Unsteadiness on feet (R26.81);Other abnormalities of gait and mobility (R26.89);Muscle weakness (generalized) (M62.81);History of falling (Z91.81)                Time: 0454-0981 OT Time Calculation (min): 29 min Charges:  OT General Charges $OT Visit: 1 Visit OT Evaluation $OT Eval Moderate Complexity: 1 Mod OT Treatments $Self Care/Home Management : 8-22 mins  Carver Fila, OTD, OTR/L SecureChat Preferred Acute Rehab [phone]   Carver Fila Koonce 06/17/2023, 3:28 PM

## 2023-06-17 NOTE — Plan of Care (Signed)
  Problem: Clinical Measurements: Goal: Ability to maintain clinical measurements within normal limits will improve Outcome: Progressing   Problem: Clinical Measurements: Goal: Ability to maintain clinical measurements within normal limits will improve Outcome: Progressing Goal: Will remain free from infection Outcome: Progressing Goal: Diagnostic test results will improve Outcome: Progressing Goal: Respiratory complications will improve Outcome: Progressing Goal: Cardiovascular complication will be avoided Outcome: Progressing   Problem: Health Behavior/Discharge Planning: Goal: Ability to manage health-related needs will improve Outcome: Progressing   Problem: Education: Goal: Knowledge of General Education information will improve Description: Including pain rating scale, medication(s)/side effects and non-pharmacologic comfort measures Outcome: Progressing

## 2023-06-17 NOTE — Evaluation (Signed)
 Physical Therapy Evaluation Patient Details Name: Jacqueline Hancock MRN: [id number] DOB: 08/01/1940 Today's Date: 06/17/2023  History of Present Illness  Pt is an 83 y/o F presenting to ED on 3/12 following a fall at home; pt notes multiple falls over the last several weeks, admittedf or hypercalcemia. Imaging notable for grade 5 right renal injury with 12 x 8 x 16 cm perinephric hematoma. PMH: CLL, ILD, CKD 3B, HTN, HLD, lymphoma  Clinical Impression  Pt admitted with above diagnosis and presents to PT with functional limitations due to deficits listed below (See PT problem list). Pt needs skilled PT to maximize independence and safety. Pt with several recent falls at home. Instructed pt to use walker at home to improve stability until strength and balance have improved. Pt lives alone but family has come into town to stay with her after dc. Recommend HHPT at dc.           If plan is discharge home, recommend the following: A little help with walking and/or transfers;A little help with bathing/dressing/bathroom;Assistance with cooking/housework;Assist for transportation;Help with stairs or ramp for entrance   Can travel by private vehicle        Equipment Recommendations None recommended by PT  Recommendations for Other Services       Functional Status Assessment Patient has had a recent decline in their functional status and demonstrates the ability to make significant improvements in function in a reasonable and predictable amount of time.     Precautions / Restrictions Precautions Precautions: Fall Restrictions Weight Bearing Restrictions Per Provider Order: No      Mobility  Bed Mobility               General bed mobility comments: Up on EOB with OT    Transfers Overall transfer level: Needs assistance Equipment used: Rolling walker (2 wheels) Transfers: Sit to/from Stand Sit to Stand: Contact guard assist           General transfer comment: Assist for safety     Ambulation/Gait Ambulation/Gait assistance: Contact guard assist Gait Distance (Feet): 130 Feet Assistive device: Rolling walker (2 wheels) Gait Pattern/deviations: Step-through pattern, Decreased step length - right, Decreased step length - left Gait velocity: decr Gait velocity interpretation: <1.31 ft/sec, indicative of household ambulator   General Gait Details: assist for safety  Stairs            Wheelchair Mobility     Tilt Bed    Modified Rankin (Stroke Patients Only)       Balance Overall balance assessment: Needs assistance Sitting-balance support: Feet supported Sitting balance-Leahy Scale: Good Sitting balance - Comments: reaches down toward feet without LOB   Standing balance support: During functional activity, Reliant on assistive device for balance Standing balance-Leahy Scale: Poor Standing balance comment: RW and supervision for static standing                             Pertinent Vitals/Pain Pain Assessment Pain Assessment: Faces Faces Pain Scale: Hurts little more Pain Location: R side Pain Descriptors / Indicators: Discomfort Pain Intervention(s): Limited activity within patient's tolerance, Repositioned    Home Living Family/patient expects to be discharged to:: Private residence Living Arrangements: Alone Available Help at Discharge: Family;Other (Comment) (son from [place] is coming to stay with her for 1 month) Type of Home: House Home Access: Stairs to enter   [employer] of Steps: 2   Home Layout: One level Home Equipment:  Rolling Walker (2 wheels)      Prior Function Prior Level of Function : Independent/Modified Independent;Driving             Mobility Comments: no AD use; used RW day of admission/day before admission ADLs Comments: no AD use;     Extremity/Trunk Assessment   Upper Extremity Assessment Upper Extremity Assessment: Defer to OT evaluation    Lower Extremity Assessment Lower  Extremity Assessment: Generalized weakness    Cervical / Trunk Assessment Cervical / Trunk Assessment: Normal  Communication   Communication Communication: No apparent difficulties    Cognition Arousal: Alert Behavior During Therapy: WFL for tasks assessed/performed                             Following commands: Intact       Cueing Cueing Techniques: Verbal cues     General Comments General comments (skin integrity, edema, etc.): VSS on RA    Exercises     Assessment/Plan    PT Assessment Patient needs continued PT services  PT Problem List Decreased strength;Decreased activity tolerance;Decreased balance;Decreased mobility       PT Treatment Interventions DME instruction;Gait training;Stair training;Functional mobility training;Therapeutic activities;Therapeutic exercise;Balance training;Patient/family education    PT Goals (Current goals can be found in the Care Plan section)  Acute Rehab PT Goals Patient Stated Goal: return home PT Goal Formulation: With patient Time For Goal Achievement: 07/01/23 Potential to Achieve Goals: Good    Frequency Min 2X/week     Co-evaluation               AM-PAC PT "6 Clicks" Mobility  Outcome Measure Help needed turning from your back to your side while in a flat bed without using bedrails?: A Little Help needed moving from lying on your back to sitting on the side of a flat bed without using bedrails?: A Little Help needed moving to and from a bed to a chair (including a wheelchair)?: A Little Help needed standing up from a chair using your arms (e.g., wheelchair or bedside chair)?: A Little Help needed to walk in hospital room?: A Little Help needed climbing 3-5 steps with a railing? : A Little 6 Click Score: 18    End of Session Equipment Utilized During Treatment: Gait belt Activity Tolerance: Patient tolerated treatment well Patient left: in chair;with call bell/phone within reach;with chair alarm  set;with family/visitor present Nurse Communication: Mobility status;Other (comment) (Nurse tech - purewick out) PT Visit Diagnosis: Other abnormalities of gait and mobility (R26.89);Muscle weakness (generalized) (M62.81);Repeated falls (R29.6);History of falling (Z91.81)    Time: 1511-1530 PT Time Calculation (min) (ACUTE ONLY): 19 min   Charges:   PT Evaluation $PT Eval Moderate Complexity: 1 Mod   PT General Charges $$ ACUTE PT VISIT: 1 Visit         [facility] PT Acute Rehabilitation Services Office [phone]   Angelina Ok [facility] 06/17/2023, 4:18 PM

## 2023-06-17 NOTE — Progress Notes (Addendum)
 OT Cancellation Note  Patient Details Name: Jacqueline Hancock MRN: [id number] DOB: 04-26-1940   Cancelled Treatment:    Reason Eval/Treat Not Completed: Active bedrest order (noted per urology note bedrest x48 hours, though initial order set is for 24 hrs. Initial order began 3/13 at 1631, will hold until after 3/15 1631  for OT evaluation as appropriate.)  Carver Fila, OTD, OTR/L [employer] Preferred Acute Rehab [phone]   Dalphine Handing 06/17/2023, 11:58 AM

## 2023-06-18 DIAGNOSIS — S37031A Laceration of right kidney, unspecified degree, initial encounter: Secondary | ICD-10-CM | POA: Diagnosis not present

## 2023-06-18 DIAGNOSIS — R531 Weakness: Secondary | ICD-10-CM | POA: Diagnosis not present

## 2023-06-18 LAB — COMPLETE METABOLIC PANEL WITH GFR
AG Ratio: 1.7 (calc) (ref 1.0–2.5)
ALT: 50 U/L — ABNORMAL HIGH (ref 6–29)
AST: 42 U/L — ABNORMAL HIGH (ref 10–35)
Albumin: 4.2 g/dL (ref 3.6–5.1)
Alkaline phosphatase (APISO): 103 U/L (ref 37–153)
BUN/Creatinine Ratio: 20 (calc) (ref 6–22)
BUN: 29 mg/dL — ABNORMAL HIGH (ref 7–25)
CO2: 27 mmol/L (ref 20–32)
Calcium: 14.9 mg/dL (ref 8.6–10.4)
Chloride: 102 mmol/L (ref 98–110)
Creat: 1.42 mg/dL — ABNORMAL HIGH (ref 0.60–0.95)
Globulin: 2.5 g/dL (ref 1.9–3.7)
Glucose, Bld: 102 mg/dL (ref 65–139)
Potassium: 4.1 mmol/L (ref 3.5–5.3)
Sodium: 137 mmol/L (ref 135–146)
Total Bilirubin: 1.2 mg/dL (ref 0.2–1.2)
Total Protein: 6.7 g/dL (ref 6.1–8.1)
eGFR: 37 mL/min/{1.73_m2} — ABNORMAL LOW (ref >=60)

## 2023-06-18 LAB — HEMOGLOBIN A1C
Hgb A1c MFr Bld: 5.7 %{Hb} — ABNORMAL HIGH (ref <5.7)
Mean Plasma Glucose: 117 mg/dL
eAG (mmol/L): 6.5 mmol/L

## 2023-06-18 LAB — CBC WITH DIFFERENTIAL/PLATELET
Absolute Lymphocytes: 1505 {cells}/uL (ref 850–3900)
Absolute Monocytes: 803 {cells}/uL (ref 200–950)
Basophils Absolute: 70 {cells}/uL (ref 0–200)
Basophils Relative: 0.9 %
Eosinophils Absolute: 133 {cells}/uL (ref 15–500)
Eosinophils Relative: 1.7 %
HCT: 37.4 % (ref 35.0–45.0)
Hemoglobin: 12.6 g/dL (ref 11.7–15.5)
MCH: 30.6 pg (ref 27.0–33.0)
MCHC: 33.7 g/dL (ref 32.0–36.0)
MCV: 90.8 fL (ref 80.0–100.0)
MPV: 11.6 fL (ref 7.5–12.5)
Monocytes Relative: 10.3 %
Neutro Abs: 5288 {cells}/uL (ref 1500–7800)
Neutrophils Relative %: 67.8 %
Platelets: 193 10*3/uL (ref 140–400)
RBC: 4.12 10*6/uL (ref 3.80–5.10)
RDW: 11.7 % (ref 11.0–15.0)
Total Lymphocyte: 19.3 %
WBC: 7.8 10*3/uL (ref 3.8–10.8)

## 2023-06-18 LAB — LIPID PANEL
Cholesterol: 140 mg/dL (ref <200)
HDL: 40 mg/dL — ABNORMAL LOW (ref >=50)
LDL Cholesterol (Calc): 77 mg/dL
Non-HDL Cholesterol (Calc): 100 mg/dL (ref <130)
Total CHOL/HDL Ratio: 3.5 (calc) (ref <5.0)
Triglycerides: 135 mg/dL (ref <150)

## 2023-06-18 LAB — RENAL FUNCTION PANEL
Albumin: 2.9 g/dL — ABNORMAL LOW (ref 3.5–5.0)
Anion gap: 10 (ref 5–15)
BUN: 25 mg/dL — ABNORMAL HIGH (ref 8–23)
CO2: 24 mmol/L (ref 22–32)
Calcium: 10.1 mg/dL (ref 8.9–10.3)
Chloride: 107 mmol/L (ref 98–111)
Creatinine, Ser: 1.31 mg/dL — ABNORMAL HIGH (ref 0.44–1.00)
GFR, Estimated: 41 mL/min — ABNORMAL LOW (ref >60)
Glucose, Bld: 83 mg/dL (ref 70–99)
Phosphorus: 2.5 mg/dL (ref 2.5–4.6)
Potassium: 3.8 mmol/L (ref 3.5–5.1)
Sodium: 141 mmol/L (ref 135–145)

## 2023-06-18 LAB — CBC
HCT: 32 % — ABNORMAL LOW (ref 36.0–46.0)
Hemoglobin: 10.7 g/dL — ABNORMAL LOW (ref 12.0–15.0)
MCH: 30.5 pg (ref 26.0–34.0)
MCHC: 33.4 g/dL (ref 30.0–36.0)
MCV: 91.2 fL (ref 80.0–100.0)
Platelets: 130 10*3/uL — ABNORMAL LOW (ref 150–400)
RBC: 3.51 MIL/uL — ABNORMAL LOW (ref 3.87–5.11)
RDW: 12 % (ref 11.5–15.5)
WBC: 8.7 10*3/uL (ref 4.0–10.5)
nRBC: 0 % (ref 0.0–0.2)

## 2023-06-18 LAB — VITAMIN D 1,25 DIHYDROXY
Vitamin D 1, 25 (OH)2 Total: 148 pg/mL — ABNORMAL HIGH (ref 18–72)
Vitamin D2 1, 25 (OH)2: <8 pg/mL
Vitamin D3 1, 25 (OH)2: 148 pg/mL

## 2023-06-18 LAB — TSH: TSH: 2.07 m[IU]/L (ref 0.40–4.50)

## 2023-06-18 MED ORDER — SENNOSIDES-DOCUSATE SODIUM 8.6-50 MG PO TABS: 2.0000 | ORAL_TABLET | Freq: Every day | ORAL | 1 refills | Status: DC

## 2023-06-18 MED ORDER — TRAMADOL HCL 50 MG PO TABS: 50.0000 mg | ORAL_TABLET | Freq: Three times a day (TID) | ORAL | 0 refills | Status: AC | PRN

## 2023-06-18 MED ORDER — ONDANSETRON HCL 4 MG PO TABS: 4.0000 mg | ORAL_TABLET | Freq: Three times a day (TID) | ORAL | 0 refills | Status: DC | PRN

## 2023-06-18 MED ORDER — POLYETHYLENE GLYCOL 3350 17 G PO PACK: 17.0000 g | PACK | Freq: Every day | ORAL | 0 refills | Status: AC | PRN

## 2023-06-18 NOTE — Progress Notes (Signed)
 RNCM received HH PT/OT orders for patient.  RNCM spoke to patient and offered choice for [facility] services.  Patient with no preference, so Cory at [place] contacted with [facility] orders and confirmation received.  Patient in agreement to services.

## 2023-06-18 NOTE — Discharge Summary (Signed)
 Physician Discharge Summary   Patient: Jacqueline Hancock MRN: [id number] DOB: 22-Sep-1940  Admit date:     06/14/2023  Discharge date: 06/18/23  Discharge Physician: Thad Ranger, MD    PCP: Caesar Bookman, NP   Recommendations at discharge:   Calcium, vitamin D supplements discontinued Outpatient follow-up with urology in 1 to 2 months, will need to repeat CT abdomen to ensure resolution of perinephric hematoma Outpatient follow-up with Dr. Candise Che, bmet in 1 week.  Discharge Diagnoses:    Hypercalcemia   Traumatic perinephric hematoma of right side   Hypertension   Chronic kidney disease, stage 3b (HCC)   ILD (interstitial lung disease) (HCC)   CLL (chronic lymphocytic leukemia) (HCC)   Weakness     Hospital Course:  Patient is a 83 year old female with CLL, ILD, CKD stage IIIb, HTN, hyperlipidemia presented to ED after falling at home.  Patient reported that she has fallen several times at home in the last couple of weeks. She says when she is walking she feels like her knees locked up which has resulted in her falling. She has been having pain to her right flank area since 1 of these falls but otherwise no obvious injury. She does note feeling somewhat confused at times as well recently. She reports new constipation as well.  In ED, hemoglobin 12.0, calcium 14.8, AST 44, ALT 48, alk phos 84.  UA negative for UTI Extensive trauma imaging performed and notable for grade 5 right renal injury with 12 x 8 x 16 cm perinephric hematoma, otherwise no acute injury or pathology. Urology was consulted, patient was admitted for further workup.     Assessment and Plan:   Hypercalcemia -Corrected calcium 15.0 on admission, patient has been taking calcium supplements at home -Vitamin D pending, PTH 9, PTH related peptide in process -Received IV fluids, Zometa and calcitonin -Resolved, calcium 10.1 at discharge.      Traumatic perinephric hematoma of right side, grade 5 renal  laceration -Right flank pain has been improving.  H&H remained stable.  Seen by urology, Dr. Jennette Bill inpatient, no urological interventions at this time. -Outpatient follow-up with urology for repeat imaging to assess resolution of hematoma and to rule out any underlying mass -No urological intervention at this time -H&H stable, 10.7 at discharge.     Hypertension -BP stable, continue amlodipine, nadolol.     Acute on chronic kidney disease, stage 3b (HCC) -Baseline creatinine has been 1.3-1.4 -Creatinine improved, 1.3 at discharge, at baseline   Nausea vomiting -Likely due to constipation -X-ray negative for ileus or obstruction, placed on bowel regimen, subsequently had a BM, improved     ILD (interstitial lung disease) (HCC) -No acute issues, follows Dr. Marchelle Gearing outpatient     CLL (chronic lymphocytic leukemia) (HCC) -Follows with Dr. Candise Che, currently monitoring off ibrutinib   Generalized debility, falls -PT OT evaluation due to home health PT   Obesity class I Estimated body mass index is 31.93 kg/m as calculated from the following:   Height as of this encounter: 4\' 11"  (1.499 m).   Weight as of this encounter: 71.7 kg.     Pain control - [employer] Controlled Substance Reporting System database was reviewed. and patient was instructed, not to drive, operate heavy machinery, perform activities at heights, swimming or participation in water activities or provide baby-sitting services while on Pain, Sleep and Anxiety Medications; until their outpatient Physician has advised to do so again. Also recommended to not to take more than prescribed Pain, Sleep  and Anxiety Medications.  Consultants: none  Procedures performed: None Disposition: Home Diet recommendation:  Discharge Diet Orders (From admission, onward)     Start     Ordered   06/18/23 0000  Diet - low sodium heart healthy        06/18/23 0826            DISCHARGE MEDICATION: Allergies as of  06/18/2023       Reactions   Nexium [esomeprazole Magnesium] Other (See Comments)   Headache   Peanut Butter Flavoring Agent (non-screening) Itching        Medication List     STOP taking these medications    calcium carbonate 1500 (600 Ca) MG Tabs tablet Commonly known as: OSCAL   Vitamin D 50 MCG (2000 UT) tablet       TAKE these medications    amLODipine 5 MG tablet Commonly known as: NORVASC Take 1 tablet (5 mg total) by mouth daily.   CLARITIN PO Take by mouth daily.   Iron 325 (65 Fe) MG Tabs Take 325 mg by mouth daily.   ketoconazole 2 % cream Commonly known as: NIZORAL Apply topically 2 (two) times daily.   ketoconazole 2 % shampoo Commonly known as: NIZORAL Apply topically.   nadolol 20 MG tablet Commonly known as: CORGARD TAKE 1 TABLET BY MOUTH DAILY FOR BLOOD PRESSURE OR TREMORS   ondansetron 4 MG tablet Commonly known as: ZOFRAN Take 1 tablet (4 mg total) by mouth every 8 (eight) hours as needed for nausea or vomiting.   polyethylene glycol 17 g packet Commonly known as: MIRALAX / GLYCOLAX Take 17 g by mouth daily as needed for mild constipation or moderate constipation. Also available OTC   senna-docusate 8.6-50 MG tablet Commonly known as: Senokot-S Take 2 tablets by mouth at bedtime. For constipation   traMADol 50 MG tablet Commonly known as: ULTRAM Take 1 tablet (50 mg total) by mouth every 8 (eight) hours as needed for moderate pain (pain score 4-6) or severe pain (pain score 7-10) (pain). What changed:  when to take this reasons to take this   VITAMIN B-COMPLEX PO Take 1 tablet by mouth daily.        Follow-up Information     Jennette Bill Scherrie Merritts, MD. Schedule an appointment as soon as possible for a visit in 2 month(s).   Specialty: Urology Contact information: [address] [place] [place] [address] [phone]         Ngetich, Donalee Citrin, NP. Schedule an appointment as soon as possible for a visit in 2  week(s).   Specialty: Family Medicine Why: for hospital follow-up Contact information: [address] [place] [address] [phone]         Johney Maine, MD. Schedule an appointment as soon as possible for a visit in 2 week(s).   Specialties: Hematology, Oncology Why: for hospital follow-up, obtain labs, BMET Contact information: [address] [place] [address] [phone]                Discharge Exam: Ceasar Mons Weights   06/14/23 1841  Weight: 71.7 kg   S: No acute complaints, son at the bedside.  Worked with PT, cleared for discharge home.  Physical Exam General: Alert and oriented x 3, NAD Cardiovascular: S1 S2 clear, RRR.  Respiratory: CTAB, no wheezing, rales or rhonchi Gastrointestinal: Soft, nontender, nondistended, NBS Ext: no pedal edema bilaterally Neuro: no new deficits Skin: No rashes Psych: Normal affect  Condition at discharge: fair  The results of significant diagnostics from this hospitalization (including imaging, microbiology, ancillary and laboratory) are listed below for reference.   Imaging Studies: DG Abd Portable 1V Result Date: 06/17/2023 CLINICAL DATA:  Ileus. EXAM: PORTABLE ABDOMEN - 1 VIEW COMPARISON:  CT, 06/14/2023. FINDINGS: Normal bowel gas pattern. Specifically, no evidence of obstruction or significant adynamic ileus. Rounded calcification projects to the left of the L1-L2 disc interspace reflecting the collecting system stone noted on the CT from 06/14/2023. Skeletal structures are unremarkable. IMPRESSION: 1. No acute findings. No evidence of bowel obstruction or significant adynamic ileus. 2. Left upper quadrant calcification consistent with a left renal pelvis stone. Electronically Signed   By: Amie Portland M.D.   On: 06/17/2023 09:25   CT Head Wo Contrast Result Date: 06/14/2023 CLINICAL DATA:  Neck trauma (Age >= 65y); Head trauma, minor (Age >= 65y) fall. EXAM: CT HEAD WITHOUT CONTRAST CT CERVICAL  SPINE WITHOUT CONTRAST TECHNIQUE: Multidetector CT imaging of the head and cervical spine was performed following the standard protocol without intravenous contrast. Multiplanar CT image reconstructions of the cervical spine were also generated. RADIATION DOSE REDUCTION: This exam was performed according to the departmental dose-optimization program which includes automated exposure control, adjustment of the mA and/or kV according to patient size and/or use of iterative reconstruction technique. COMPARISON:  CT head 11/11/2013 FINDINGS: CT HEAD FINDINGS Brain: No evidence of large-territorial acute infarction. No parenchymal hemorrhage. No mass lesion. No extra-axial collection. No mass effect or midline shift. No hydrocephalus. Basilar cisterns are patent. Vascular: No hyperdense vessel. Skull: No acute fracture or focal lesion. Sinuses/Orbits: Paranasal sinuses and mastoid air cells are clear. The orbits are unremarkable. Other: None. CT CERVICAL SPINE FINDINGS Alignment: Normal. Skull base and vertebrae: Multilevel severe degenerative changes of the spine with posterior disc osteophyte complex formation and posterior longitudinal ligament calcification at the C4-C5 level. No severe osseous neural foraminal or central canal stenosis. No acute fracture. No aggressive appearing focal osseous lesion or focal pathologic process. Soft tissues and spinal canal: No prevertebral fluid or swelling. No visible canal hematoma. Upper chest: Unremarkable. Other: None. IMPRESSION: 1. No acute intracranial abnormality. 2. No acute displaced fracture or traumatic listhesis of the cervical spine. Electronically Signed   By: Tish Frederickson M.D.   On: 06/14/2023 23:18   CT Cervical Spine Wo Contrast Result Date: 06/14/2023 CLINICAL DATA:  Neck trauma (Age >= 65y); Head trauma, minor (Age >= 65y) fall. EXAM: CT HEAD WITHOUT CONTRAST CT CERVICAL SPINE WITHOUT CONTRAST TECHNIQUE: Multidetector CT imaging of the head and cervical  spine was performed following the standard protocol without intravenous contrast. Multiplanar CT image reconstructions of the cervical spine were also generated. RADIATION DOSE REDUCTION: This exam was performed according to the departmental dose-optimization program which includes automated exposure control, adjustment of the mA and/or kV according to patient size and/or use of iterative reconstruction technique. COMPARISON:  CT head 11/11/2013 FINDINGS: CT HEAD FINDINGS Brain: No evidence of large-territorial acute infarction. No parenchymal hemorrhage. No mass lesion. No extra-axial collection. No mass effect or midline shift. No hydrocephalus. Basilar cisterns are patent. Vascular: No hyperdense vessel. Skull: No acute fracture or focal lesion. Sinuses/Orbits: Paranasal sinuses and mastoid air cells are clear. The orbits are unremarkable. Other: None. CT CERVICAL SPINE FINDINGS Alignment: Normal. Skull base and vertebrae: Multilevel severe degenerative changes of the spine with posterior disc osteophyte complex formation and posterior longitudinal ligament calcification at the C4-C5 level. No severe osseous neural foraminal or central canal stenosis.  No acute fracture. No aggressive appearing focal osseous lesion or focal pathologic process. Soft tissues and spinal canal: No prevertebral fluid or swelling. No visible canal hematoma. Upper chest: Unremarkable. Other: None. IMPRESSION: 1. No acute intracranial abnormality. 2. No acute displaced fracture or traumatic listhesis of the cervical spine. Electronically Signed   By: Tish Frederickson M.D.   On: 06/14/2023 23:18   CT ABDOMEN PELVIS W CONTRAST Result Date: 06/14/2023 CLINICAL DATA:  Abdominal trauma, blunt The pt has had 3 falls since yesterday and she reports no injury to her head she is also c/o dizziness sl nausea she is also c/o chest pain EXAM: CT ABDOMEN AND PELVIS WITH CONTRAST TECHNIQUE: Multidetector CT imaging of the abdomen and pelvis was  performed using the standard protocol following bolus administration of intravenous contrast. RADIATION DOSE REDUCTION: This exam was performed according to the departmental dose-optimization program which includes automated exposure control, adjustment of the mA and/or kV according to patient size and/or use of iterative reconstruction technique. CONTRAST:  60mL OMNIPAQUE IOHEXOL 350 MG/ML SOLN COMPARISON:  None Available. FINDINGS: Lower chest: No acute abnormality.  Small hiatal hernia. Hepatobiliary: Not enlarged. No focal lesion. No laceration or subcapsular hematoma. The gallbladder is otherwise unremarkable with no radio-opaque gallstones. No biliary ductal dilatation. Pancreas: Normal pancreatic contour. No main pancreatic duct dilatation. Spleen: Not enlarged. No focal lesion. No laceration, subcapsular hematoma, or vascular injury. Adrenals/Urinary Tract: No nodularity bilaterally. The left kidney enhances homogeneously. Shattered right superior renal pole with associated 12 x 8 x 16 cm perinephric hematoma. Nonenhancing right superior renal pole. Concern for main renal artery avulsion. No definite extravasation of excreted intravenous contrast on delayed imaging from the collecting system. No definite findings of lumen on delayed imaging to suggest active hemorrhage. No hydroureter. The urinary bladder is unremarkable. Stomach/Bowel: No small or large bowel wall thickening or dilatation. The appendix is not definitely identified with no inflammatory changes in the right lower quadrant to suggest acute appendicitis. Vasculature/Lymphatics: No abdominal aorta or iliac aneurysm. No active contrast extravasation or pseudoaneurysm. No abdominal, pelvic, inguinal lymphadenopathy. Reproductive: Normal. Other: No simple free fluid ascites. No pneumoperitoneum. No organized fluid collection. Musculoskeletal: No significant soft tissue hematoma. No acute pelvic fracture. No spinal fracture. Other ports and devices:  None. IMPRESSION: 1. 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 2. No acute fracture or traumatic malalignment of the lumbar spine. These results were called by telephone at the time of interpretation on 06/14/2023 at 11:10 pm to provider Dr. Fredderick Phenix, who verbally acknowledged these results. Electronically Signed   By: Tish Frederickson M.D.   On: 06/14/2023 23:13   DG Hip Unilat W or Wo Pelvis 2-3 Views Left Result Date: 06/14/2023 CLINICAL DATA:  Pain, recurrent falls. EXAM: DG HIP (WITH OR WITHOUT PELVIS) 2-3V LEFT COMPARISON:  None Available. FINDINGS: No fracture of the pelvis or left hip. No left hip dislocation. Intact pubic rami. Minor acetabular spurring. Pubic symphysis and sacroiliac joints are congruent. IMPRESSION: 1. No fracture or dislocation of the pelvis or left hip. 2. Minor degenerative acetabular spurring. Electronically Signed   By: Narda Rutherford M.D.   On: 06/14/2023 20:10   DG Knee 2 Views Right Result Date: 06/14/2023 CLINICAL DATA:  Pain.  Recurrent falls. EXAM: RIGHT KNEE - 1-2 VIEW COMPARISON:  None Available. FINDINGS: No evidence of fracture, dislocation, or joint effusion. Mild medial tibiofemoral joint space narrowing. Mild patellofemoral spurring. Moderate quadriceps and patellar tendon enthesophytes. IMPRESSION: 1. No  fracture or subluxation of the right knee. 2. Mild osteoarthritis. Electronically Signed   By: Narda Rutherford M.D.   On: 06/14/2023 20:09   DG Knee 2 Views Left Result Date: 06/14/2023 CLINICAL DATA:  Pain.  Recurrent falls. EXAM: LEFT KNEE - 1-2 VIEW COMPARISON:  None Available. FINDINGS: No evidence of fracture, dislocation, or joint effusion. Mild medial tibiofemoral joint space narrowing. Mild patellofemoral spurring. Quadriceps and patellar tendon enthesophytes. IMPRESSION: Mild osteoarthritis. No acute fracture or subluxation. Electronically Signed   By: Narda Rutherford M.D.   On: 06/14/2023 20:08   DG Chest 2 View Result  Date: 06/14/2023 CLINICAL DATA:  Recurrent falls. EXAM: CHEST - 2 VIEW COMPARISON:  06/02/2023 FINDINGS: The cardiomediastinal contours are normal. Subpleural changes on prior exam are not as well demonstrated. Pulmonary vasculature is normal. No consolidation, pleural effusion, or pneumothorax. No acute osseous abnormalities are seen. IMPRESSION: No acute findings. Electronically Signed   By: Narda Rutherford M.D.   On: 06/14/2023 20:07   DG Ribs Unilateral W/Chest Right Result Date: 06/02/2023 CLINICAL DATA:  Right axillary and posterior rib pain for the past 2 weeks. No injury. EXAM: RIGHT RIBS AND CHEST - 3+ VIEW COMPARISON:  CT chest dated December 02, 2022. FINDINGS: No fracture or other bone lesions are seen involving the ribs. There is no evidence of pneumothorax or pleural effusion. Mild interstitial thickening at the lung bases has progressed since the prior x-ray. Heart size and mediastinal contours are within normal limits. IMPRESSION: 1. No rib fracture. 2. Mildly progressed chronic interstitial lung disease. Electronically Signed   By: Obie Dredge M.D.   On: 06/02/2023 13:52    Microbiology: Results for orders placed or performed in visit on 02/03/22  MICROSCOPIC MESSAGE     Status: None   Collection Time: 02/03/22  3:58 PM  Result Value Ref Range Status   Note   Final    Comment: This urine was analyzed for the presence of WBC,  RBC, bacteria, casts, and other formed elements.  Only those elements seen were reported. . .     Labs: CBC: Recent Labs  Lab 06/14/23 1134 06/14/23 1845 06/15/23 0916 06/15/23 1437 06/16/23 0739 06/16/23 1153 06/16/23 1903 06/17/23 0632 06/18/23 0715  WBC 7.8 7.8 6.0  --   --   --   --  11.4* 8.7  NEUTROABS 5,288  --   --   --   --   --   --   --   --   HGB 12.6 12.0 11.0*   < > 11.4* 10.6* 11.1* 11.1* 10.7*  HCT 37.4 36.6 33.2*   < > 34.8* 31.7* 32.8* 32.8* 32.0*  MCV 90.8 91.5 91.5  --   --   --   --  90.9 91.2  PLT 193 177 135*  --    --   --   --  138* 130*   < > = values in this interval not displayed.   Basic Metabolic Panel: Recent Labs  Lab 06/14/23 1845 06/15/23 0916 06/16/23 0739 06/17/23 0632 06/18/23 0715  NA 139 141 141 142 141  K 4.1 3.6 4.0 3.9 3.8  CL 104 110 110 111 107  CO2 24 25 20* 23 24  GLUCOSE 109* 107* 89 93 83  BUN 28* 23 20 22  25*  CREATININE 1.61* 1.40* 1.43* 1.51* 1.31*  CALCIUM 14.8* 12.7*  12.8* 11.4* 10.8* 10.1  PHOS  --   --   --  3.2 2.5   Liver Function Tests:  Recent Labs  Lab 06/14/23 1134 06/14/23 1845 06/15/23 0916 06/16/23 0739 06/17/23 0632 06/18/23 0715  AST 42* 44* 31 26  --   --   ALT 50* 48* 37 30  --   --   ALKPHOS  --  84 69 61  --   --   BILITOT 1.2 1.1 1.2 1.2  --   --   PROT 6.7 6.6 5.7* 5.6*  --   --   ALBUMIN  --  3.8 3.2* 3.2* 3.1* 2.9*   CBG: No results for input(s): "GLUCAP" in the last 168 hours.  Discharge time spent: greater than 30 minutes.  Signed: Thad Ranger, MD [facility] 06/18/2023

## 2023-06-19 ENCOUNTER — Encounter: Payer: Self-pay | Admitting: Physician Assistant

## 2023-06-22 ENCOUNTER — Telehealth: Payer: Self-pay

## 2023-06-22 NOTE — Telephone Encounter (Signed)
 Copied from CRM [id number]. Topic: General - Other >> Jun 22, 2023 10:00 AM Adrianna P wrote: Reason for CRM: Eileen Stanford the rehab manager called to notify provider of the delay of home health pt getting started per family request. Pt will start on 3/21   Message sent to Ngetich, Donalee Citrin, NP

## 2023-06-22 NOTE — Telephone Encounter (Signed)
 noted

## 2023-06-23 LAB — PTH-RELATED PEPTIDE: PTH-related peptide: <2 pmol/L

## 2023-06-27 ENCOUNTER — Telehealth: Payer: Self-pay

## 2023-06-27 NOTE — Telephone Encounter (Signed)
 Noted.

## 2023-06-27 NOTE — Telephone Encounter (Signed)
 Copied from CRM [id number]. Topic: Clinical - Home Health Verbal Orders >> Jun 27, 2023  9:52 AM Corin V wrote: Caller/Agency: Pam Drown Number: [phone] Service Requested: Physical Therapy Frequency: 2 X for 2 weeks, 1X for 2 weeks for strength, balance, gait training, stairs, and HEP Any new concerns about the patient? No  Spoke with Jenna-Vayada from [facility] health in regards for the Home Health Verbal Orders as followed  2 X for 2 weeks, 1X for 2 weeks for strength, balance, gait training, stairs, and HEP . The verbal order was given and Ngetich, Dinah C, NP was notify. FYI  Message sent to Ngetich, Donalee Citrin, NP

## 2023-06-28 ENCOUNTER — Ambulatory Visit (INDEPENDENT_AMBULATORY_CARE_PROVIDER_SITE_OTHER): Admitting: Family

## 2023-06-28 ENCOUNTER — Encounter: Payer: Self-pay | Admitting: Family

## 2023-06-28 VITALS — BP 138/88 | HR 68 | Temp 97.6°F | Resp 20 | Ht 59.0 in | Wt 160.0 lb

## 2023-06-28 DIAGNOSIS — S37091A Other injury of right kidney, initial encounter: Secondary | ICD-10-CM

## 2023-06-28 DIAGNOSIS — C9110 Chronic lymphocytic leukemia of B-cell type not having achieved remission: Secondary | ICD-10-CM

## 2023-06-28 DIAGNOSIS — R5381 Other malaise: Secondary | ICD-10-CM

## 2023-06-28 DIAGNOSIS — I10 Essential (primary) hypertension: Secondary | ICD-10-CM | POA: Diagnosis not present

## 2023-06-28 DIAGNOSIS — R600 Localized edema: Secondary | ICD-10-CM

## 2023-06-28 DIAGNOSIS — S37031D Laceration of right kidney, unspecified degree, subsequent encounter: Secondary | ICD-10-CM

## 2023-06-28 DIAGNOSIS — N183 Chronic kidney disease, stage 3 unspecified: Secondary | ICD-10-CM

## 2023-06-28 NOTE — Progress Notes (Signed)
 Provider: Richarda Blade FNP-C  Annett Boxwell, Donalee Citrin, NP  Patient Care Team: Kaedyn Belardo, Donalee Citrin, NP as PCP - General (Family Medicine) Flo Shanks, MD as Consulting Physician (Otolaryngology) Sharrell Ku, MD as Consulting Physician (Gastroenterology) Pennie Rushing, Maris Berger, MD (Inactive) as Consulting Physician (Obstetrics and Gynecology) Jethro Bolus, MD as Consulting Physician (Ophthalmology) Charlett Nose, [facility] (Inactive) as Pharmacist (Pharmacist)  Extended Emergency Contact Information Primary Emergency Contact: Georga Kaufmann Address: [address]          [place], [place] [address] D[place] of [place] Home Phone: [phone] Relation: Son Secondary Emergency Contact: Hollingsworth,Keith Mobile Phone: [phone] Relation: Son Preferred language: English Interpreter needed? No  Code Status:  Full Code  Goals of care: Advanced Directive information    06/28/2023   11:04 AM  Advanced Directives  Does Patient Have a Medical Advance Directive? Yes  Type of Estate agent of [place];Living will  Does patient want to make changes to medical advance directive? No - Patient declined  Copy of Healthcare Power of Attorney in Chart? No - copy requested     Chief Complaint  Patient presents with   Follow-up    2 week follow up    Discussed the use of AI scribe software for clinical note transcription with the patient, who gave verbal consent to proceed.  History of Present Illness   Jacqueline Hancock is a 83 year old female with HTN, chronic kidney disease stage 3, CLL,ILD, Fatty Liver and interstitial lung disease who presents for follow-up after recent hospitalization for weakness and Frequent fall.  She was hospitalized two weeks ago from 06/14/2023 to 06/18/2023 for significant weakness, which has since improved. During her hospital stay, she was found to have hypercalcemia calcium 15.0, leading to the discontinuation of her calcium supplements. she was treated  with IVF, Zometa and Calcitonin. Her calcium on discharged improved to 10.1. She has not experienced any falls since her discharge.  She reports persistent swelling in her legs and feet, despite elevating her legs at night and while sitting. She uses compression stockings but finds them very tight. Has stopped wearing compression stockings. She has gained two pounds, which she attributes to fluid retention.  She experienced right flank pain, which has improved but still occurs occasionally. During her hospital stay, she was diagnosed with a grade five traumatic hematoma on the right kidney and was advised to follow up with a urologist. She has scheduled an appointment but is unsure of the date.  She has a history of chronic kidney disease, stage 3B, and is currently taking amlodipine and nadolol for blood pressure management. No nausea or vomiting since her hospital discharge.  She has interstitial lung disease and is scheduled to follow up with Dr. Marchelle Gearing. She also has chronic lymphocytic leukemia and is not currently on any medication for it. She is scheduled to follow up with Dr. Candise Che.  She discharged to follow up with physical and occupational therapy to improve strength and mobility. A therapist visited her home recently. She used a walker after her hospital discharge but no longer feels wobbly and does not use a cane.    Past Medical History:  Diagnosis Date   Dyspnea 2021   with exersion    Early cataracts, bilateral    Elevated hemoglobin A1c    Fibroids    GERD (gastroesophageal reflux disease)    Hyperlipidemia    Hypertension    Lymphoma (HCC)    Osteopenia    Past Surgical History:  Procedure Laterality  Date   ABDOMINAL HYSTERECTOMY     APPENDECTOMY     CATARACT EXTRACTION, BILATERAL Bilateral 09/2017   Dr. 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   TONSILLECTOMY AND ADENOIDECTOMY      Allergies  Allergen Reactions   Nexium [Esomeprazole Magnesium]  Other (See Comments)    Headache   Peanut Butter Flavoring Agent (Non-Screening) Itching    Outpatient Encounter Medications as of 06/28/2023  Medication Sig   amLODipine (NORVASC) 5 MG tablet Take 1 tablet (5 mg total) by mouth daily.   B Complex Vitamins (VITAMIN B-COMPLEX PO) Take 1 tablet by mouth daily.   Ferrous Sulfate (IRON) 325 (65 Fe) MG TABS Take 325 mg by mouth daily.    Loratadine (CLARITIN PO) Take by mouth daily.   nadolol (CORGARD) 20 MG tablet TAKE 1 TABLET BY MOUTH DAILY FOR BLOOD PRESSURE OR TREMORS   ondansetron (ZOFRAN) 4 MG tablet Take 1 tablet (4 mg total) by mouth every 8 (eight) hours as needed for nausea or vomiting.   polyethylene glycol (MIRALAX / GLYCOLAX) 17 g packet Take 17 g by mouth daily as needed for mild constipation or moderate constipation. Also available OTC   senna-docusate (SENOKOT-S) 8.6-50 MG tablet Take 2 tablets by mouth at bedtime. For constipation   traMADol (ULTRAM) 50 MG tablet Take 1 tablet (50 mg total) by mouth every 8 (eight) hours as needed for moderate pain (pain score 4-6) or severe pain (pain score 7-10) (pain).   ketoconazole (NIZORAL) 2 % cream Apply topically 2 (two) times daily. (Patient not taking: Reported on 06/28/2023)   ketoconazole (NIZORAL) 2 % shampoo Apply topically. (Patient not taking: Reported on 06/28/2023)   [DISCONTINUED] gabapentin (NEURONTIN) 100 MG capsule Take 1 capsule 3 x /day for Neuropathy Pain (Patient not taking: Reported on 01/25/2020)   No facility-administered encounter medications on file as of 06/28/2023.    Review of Systems  Constitutional:  Negative for appetite change, chills, fatigue, fever and unexpected weight change.  HENT:  Negative for congestion, dental problem, ear discharge, ear pain, facial swelling, hearing loss, nosebleeds, postnasal drip, rhinorrhea, sinus pressure, sinus pain, sneezing, sore throat, tinnitus and trouble swallowing.   Eyes:  Negative for pain, discharge, redness, itching  and visual disturbance.  Respiratory:  Negative for cough, chest tightness, shortness of breath and wheezing.   Cardiovascular:  Positive for leg swelling. Negative for chest pain and palpitations.  Gastrointestinal:  Negative for abdominal distention, abdominal pain, blood in stool, constipation, diarrhea, nausea and vomiting.  Endocrine: Negative for cold intolerance, heat intolerance, polydipsia, polyphagia and polyuria.  Genitourinary:  Negative for difficulty urinating, dysuria, frequency and urgency.       Right Flank pain has improved   Musculoskeletal:  Negative for arthralgias, back pain, gait problem, joint swelling, myalgias, neck pain and neck stiffness.  Skin:  Negative for color change, pallor, rash and wound.  Neurological:  Negative for dizziness, syncope, speech difficulty, weakness, light-headedness, numbness and headaches.  Hematological:  Does not bruise/bleed easily.  Psychiatric/Behavioral:  Negative for agitation, behavioral problems, confusion, hallucinations, self-injury, sleep disturbance and suicidal ideas. The patient is not nervous/anxious.     Immunization History  Administered Date(s) Administered   Influenza, High Dose Seasonal PF 01/28/2015, 12/11/2015, 01/25/2017, 01/22/2018, 01/10/2019, 02/03/2019, 02/05/2020, 02/03/2021, 02/03/2022, 12/27/2022   Influenza-Unspecified 01/29/2013, 12/27/2013, 01/25/2017   PFIZER(Purple Top)SARS-COV-2 Vaccination 05/11/2019, 06/03/2019   PPD Test 02/25/2013   Pfizer Covid-19 Vaccine Bivalent Booster 48yrs & up 01/20/2022   Pneumococcal Conjugate-13  01/24/2017   Pneumococcal Polysaccharide-23 02/25/2013   Td 09/13/2001, 02/20/2012   Tdap 02/25/2013   Zoster, Live 10/23/2006   Pertinent  Health Maintenance Due  Topic Date Due   INFLUENZA VACCINE  Completed   DEXA SCAN  Completed      02/11/2021    2:54 PM 04/12/2021   11:54 AM 08/05/2021   12:08 AM 08/17/2022   10:08 AM 06/14/2023   10:41 AM  Fall Risk  Falls in the  past year?   0 0 1  Was there an injury with Fall?     0  Fall Risk Category Calculator     2  (RETIRED) Patient Fall Risk Level Low fall risk Low fall risk     Patient at Risk for Falls Due to   No Fall Risks  Impaired balance/gait;Impaired mobility  Fall risk Follow up   Falls evaluation completed;Education provided;Falls prevention discussed  Falls evaluation completed   Functional Status Survey:    Vitals:   06/28/23 1109  BP: 138/88  Pulse: 68  Resp: 20  Temp: 97.6 F (36.4 C)  SpO2: 98%  Weight: 160 lb (72.6 kg)  Height: 4\' 11"  (1.499 m)   Body mass index is 32.32 kg/m. Physical Exam  VITALS: T- 97.6, P- 68, BP- 130/88, SaO2- 98% MEASUREMENTS: Weight- 160. GENERAL: Alert, cooperative, well developed, no acute distress HEENT: Normocephalic, normal oropharynx, moist mucous membranes, nose normal, no sinus tenderness CHEST: Clear to auscultation bilaterally, no wheezes, rhonchi, or crackles CARDIOVASCULAR: Normal heart rate and rhythm, S1 and S2 normal without murmurs ABDOMEN: Soft, non-tender, non-distended, without organomegaly, normal bowel sounds EXTREMITIES: 1 +Pitting edema in legs, no cyanosis NEUROLOGICAL: Cranial nerves grossly intact, moves all extremities without gross motor or sensory deficit SKIN: No rash, lesion or erythema  PSYCHIATRY/BEHAVIORAL: Mood stable      Labs reviewed: Recent Labs    02/14/23 1119 03/14/23 1100 06/16/23 0739 06/17/23 0632 06/18/23 0715  NA 139   < > 141 142 141  K 4.5   < > 4.0 3.9 3.8  CL 102   < > 110 111 107  CO2 26   < > 20* 23 24  GLUCOSE 98   < > 89 93 83  BUN 22   < > 20 22 25*  CREATININE 1.19*   < > 1.43* 1.51* 1.31*  CALCIUM 10.2   < > 11.4* 10.8* 10.1  MG 2.1  --   --   --   --   PHOS  --   --   --  3.2 2.5   < > = values in this interval not displayed.   Recent Labs    06/14/23 1845 06/15/23 0916 06/16/23 0739 06/17/23 0632 06/18/23 0715  AST 44* 31 26  --   --   ALT 48* 37 30  --   --   ALKPHOS  84 69 61  --   --   BILITOT 1.1 1.2 1.2  --   --   PROT 6.6 5.7* 5.6*  --   --   ALBUMIN 3.8 3.2* 3.2* 3.1* 2.9*   Recent Labs    02/14/23 1119 03/14/23 1100 06/14/23 1134 06/14/23 1845 06/15/23 0916 06/15/23 1437 06/16/23 1903 06/17/23 0632 06/18/23 0715  WBC 4.7 5.8 7.8   < > 6.0  --   --  11.4* 8.7  NEUTROABS 2,228 2.7 5,288  --   --   --   --   --   --   HGB 14.0 14.1  12.6   < > 11.0*   < > 11.1* 11.1* 10.7*  HCT 41.2 42.0 37.4   < > 33.2*   < > 32.8* 32.8* 32.0*  MCV 92.0 91.7 90.8   < > 91.5  --   --  90.9 91.2  PLT 153 142* 193   < > 135*  --   --  138* 130*   < > = values in this interval not displayed.   Lab Results  Component Value Date   TSH 2.07 06/14/2023   Lab Results  Component Value Date   HGBA1C 5.7 (H) 06/14/2023   Lab Results  Component Value Date   CHOL 140 06/14/2023   HDL 40 (L) 06/14/2023   LDLCALC 77 06/14/2023   TRIG 135 06/14/2023   CHOLHDL 3.5 06/14/2023    Significant Diagnostic Results in last 30 days:  DG Abd Portable 1V Result Date: 06/17/2023 CLINICAL DATA:  Ileus. EXAM: PORTABLE ABDOMEN - 1 VIEW COMPARISON:  CT, 06/14/2023. FINDINGS: Normal bowel gas pattern. Specifically, no evidence of obstruction or significant adynamic ileus. Rounded calcification projects to the left of the L1-L2 disc interspace reflecting the collecting system stone noted on the CT from 06/14/2023. Skeletal structures are unremarkable. IMPRESSION: 1. No acute findings. No evidence of bowel obstruction or significant adynamic ileus. 2. Left upper quadrant calcification consistent with a left renal pelvis stone. Electronically Signed   By: Amie Portland M.D.   On: 06/17/2023 09:25   CT Head Wo Contrast Result Date: 06/14/2023 CLINICAL DATA:  Neck trauma (Age >= 65y); Head trauma, minor (Age >= 65y) fall. EXAM: CT HEAD WITHOUT CONTRAST CT CERVICAL SPINE WITHOUT CONTRAST TECHNIQUE: Multidetector CT imaging of the head and cervical spine was performed following the  standard protocol without intravenous contrast. Multiplanar CT image reconstructions of the cervical spine were also generated. RADIATION DOSE REDUCTION: This exam was performed according to the departmental dose-optimization program which includes automated exposure control, adjustment of the mA and/or kV according to patient size and/or use of iterative reconstruction technique. COMPARISON:  CT head 11/11/2013 FINDINGS: CT HEAD FINDINGS Brain: No evidence of large-territorial acute infarction. No parenchymal hemorrhage. No mass lesion. No extra-axial collection. No mass effect or midline shift. No hydrocephalus. Basilar cisterns are patent. Vascular: No hyperdense vessel. Skull: No acute fracture or focal lesion. Sinuses/Orbits: Paranasal sinuses and mastoid air cells are clear. The orbits are unremarkable. Other: None. CT CERVICAL SPINE FINDINGS Alignment: Normal. Skull base and vertebrae: Multilevel severe degenerative changes of the spine with posterior disc osteophyte complex formation and posterior longitudinal ligament calcification at the C4-C5 level. No severe osseous neural foraminal or central canal stenosis. No acute fracture. No aggressive appearing focal osseous lesion or focal pathologic process. Soft tissues and spinal canal: No prevertebral fluid or swelling. No visible canal hematoma. Upper chest: Unremarkable. Other: None. IMPRESSION: 1. No acute intracranial abnormality. 2. No acute displaced fracture or traumatic listhesis of the cervical spine. Electronically Signed   By: Tish Frederickson M.D.   On: 06/14/2023 23:18   CT Cervical Spine Wo Contrast Result Date: 06/14/2023 CLINICAL DATA:  Neck trauma (Age >= 65y); Head trauma, minor (Age >= 65y) fall. EXAM: CT HEAD WITHOUT CONTRAST CT CERVICAL SPINE WITHOUT CONTRAST TECHNIQUE: Multidetector CT imaging of the head and cervical spine was performed following the standard protocol without intravenous contrast. Multiplanar CT image reconstructions  of the cervical spine were also generated. RADIATION DOSE REDUCTION: This exam was performed according to the departmental dose-optimization program which includes automated exposure  control, adjustment of the mA and/or kV according to patient size and/or use of iterative reconstruction technique. COMPARISON:  CT head 11/11/2013 FINDINGS: CT HEAD FINDINGS Brain: No evidence of large-territorial acute infarction. No parenchymal hemorrhage. No mass lesion. No extra-axial collection. No mass effect or midline shift. No hydrocephalus. Basilar cisterns are patent. Vascular: No hyperdense vessel. Skull: No acute fracture or focal lesion. Sinuses/Orbits: Paranasal sinuses and mastoid air cells are clear. The orbits are unremarkable. Other: None. CT CERVICAL SPINE FINDINGS Alignment: Normal. Skull base and vertebrae: Multilevel severe degenerative changes of the spine with posterior disc osteophyte complex formation and posterior longitudinal ligament calcification at the C4-C5 level. No severe osseous neural foraminal or central canal stenosis. No acute fracture. No aggressive appearing focal osseous lesion or focal pathologic process. Soft tissues and spinal canal: No prevertebral fluid or swelling. No visible canal hematoma. Upper chest: Unremarkable. Other: None. IMPRESSION: 1. No acute intracranial abnormality. 2. No acute displaced fracture or traumatic listhesis of the cervical spine. Electronically Signed   By: Tish Frederickson M.D.   On: 06/14/2023 23:18   CT ABDOMEN PELVIS W CONTRAST Result Date: 06/14/2023 CLINICAL DATA:  Abdominal trauma, blunt The pt has had 3 falls since yesterday and she reports no injury to her head she is also c/o dizziness sl nausea she is also c/o chest pain EXAM: CT ABDOMEN AND PELVIS WITH CONTRAST TECHNIQUE: Multidetector CT imaging of the abdomen and pelvis was performed using the standard protocol following bolus administration of intravenous contrast. RADIATION DOSE REDUCTION: This  exam was performed according to the departmental dose-optimization program which includes automated exposure control, adjustment of the mA and/or kV according to patient size and/or use of iterative reconstruction technique. CONTRAST:  60mL OMNIPAQUE IOHEXOL 350 MG/ML SOLN COMPARISON:  None Available. FINDINGS: Lower chest: No acute abnormality.  Small hiatal hernia. Hepatobiliary: Not enlarged. No focal lesion. No laceration or subcapsular hematoma. The gallbladder is otherwise unremarkable with no radio-opaque gallstones. No biliary ductal dilatation. Pancreas: Normal pancreatic contour. No main pancreatic duct dilatation. Spleen: Not enlarged. No focal lesion. No laceration, subcapsular hematoma, or vascular injury. Adrenals/Urinary Tract: No nodularity bilaterally. The left kidney enhances homogeneously. Shattered right superior renal pole with associated 12 x 8 x 16 cm perinephric hematoma. Nonenhancing right superior renal pole. Concern for main renal artery avulsion. No definite extravasation of excreted intravenous contrast on delayed imaging from the collecting system. No definite findings of lumen on delayed imaging to suggest active hemorrhage. No hydroureter. The urinary bladder is unremarkable. Stomach/Bowel: No small or large bowel wall thickening or dilatation. The appendix is not definitely identified with no inflammatory changes in the right lower quadrant to suggest acute appendicitis. Vasculature/Lymphatics: No abdominal aorta or iliac aneurysm. No active contrast extravasation or pseudoaneurysm. No abdominal, pelvic, inguinal lymphadenopathy. Reproductive: Normal. Other: No simple free fluid ascites. No pneumoperitoneum. No organized fluid collection. Musculoskeletal: No significant soft tissue hematoma. No acute pelvic fracture. No spinal fracture. Other ports and devices: None. IMPRESSION: 1. 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 2. No acute fracture or traumatic  malalignment of the lumbar spine. These results were called by telephone at the time of interpretation on 06/14/2023 at 11:10 pm to provider Dr. Fredderick Phenix, who verbally acknowledged these results. Electronically Signed   By: Tish Frederickson M.D.   On: 06/14/2023 23:13   DG Hip Unilat W or Wo Pelvis 2-3 Views Left Result Date: 06/14/2023 CLINICAL DATA:  Pain, recurrent falls. EXAM: DG HIP (  WITH OR WITHOUT PELVIS) 2-3V LEFT COMPARISON:  None Available. FINDINGS: No fracture of the pelvis or left hip. No left hip dislocation. Intact pubic rami. Minor acetabular spurring. Pubic symphysis and sacroiliac joints are congruent. IMPRESSION: 1. No fracture or dislocation of the pelvis or left hip. 2. Minor degenerative acetabular spurring. Electronically Signed   By: Narda Rutherford M.D.   On: 06/14/2023 20:10   DG Knee 2 Views Right Result Date: 06/14/2023 CLINICAL DATA:  Pain.  Recurrent falls. EXAM: RIGHT KNEE - 1-2 VIEW COMPARISON:  None Available. FINDINGS: No evidence of fracture, dislocation, or joint effusion. Mild medial tibiofemoral joint space narrowing. Mild patellofemoral spurring. Moderate quadriceps and patellar tendon enthesophytes. IMPRESSION: 1. No fracture or subluxation of the right knee. 2. Mild osteoarthritis. Electronically Signed   By: Narda Rutherford M.D.   On: 06/14/2023 20:09   DG Knee 2 Views Left Result Date: 06/14/2023 CLINICAL DATA:  Pain.  Recurrent falls. EXAM: LEFT KNEE - 1-2 VIEW COMPARISON:  None Available. FINDINGS: No evidence of fracture, dislocation, or joint effusion. Mild medial tibiofemoral joint space narrowing. Mild patellofemoral spurring. Quadriceps and patellar tendon enthesophytes. IMPRESSION: Mild osteoarthritis. No acute fracture or subluxation. Electronically Signed   By: Narda Rutherford M.D.   On: 06/14/2023 20:08   DG Chest 2 View Result Date: 06/14/2023 CLINICAL DATA:  Recurrent falls. EXAM: CHEST - 2 VIEW COMPARISON:  06/02/2023 FINDINGS: The cardiomediastinal  contours are normal. Subpleural changes on prior exam are not as well demonstrated. Pulmonary vasculature is normal. No consolidation, pleural effusion, or pneumothorax. No acute osseous abnormalities are seen. IMPRESSION: No acute findings. Electronically Signed   By: Narda Rutherford M.D.   On: 06/14/2023 20:07   DG Ribs Unilateral W/Chest Right Result Date: 06/02/2023 CLINICAL DATA:  Right axillary and posterior rib pain for the past 2 weeks. No injury. EXAM: RIGHT RIBS AND CHEST - 3+ VIEW COMPARISON:  CT chest dated December 02, 2022. FINDINGS: No fracture or other bone lesions are seen involving the ribs. There is no evidence of pneumothorax or pleural effusion. Mild interstitial thickening at the lung bases has progressed since the prior x-ray. Heart size and mediastinal contours are within normal limits. IMPRESSION: 1. No rib fracture. 2. Mildly progressed chronic interstitial lung disease. Electronically Signed   By: Obie Dredge M.D.   On: 06/02/2023 13:52    Assessment/Plan  Traumatic Renal Hematoma   Right-sided flank pain with a grade 5 traumatic hematoma of the right kidney, likely due to recent falls. Monitoring for further bleeding is necessary.   - Confirm appointment with urologist for follow-up   - Order hemoglobin test to monitor for further bleeding     Hypertension  Blood pressure at goal  - continue on Amlodipine and nadolol  - Continue to monitor blood pressure ant home.  Chronic Kidney Disease Stage 3B   Acute on chronic kidney disease stage 3B. NSAIDs should be avoided as she can worsen kidney function. Diuretics are necessary for fluid management but may impact kidney function. Stabilization of stage 3 CKD is possible but reversal is unlikely due to factors like blood pressure, age, and medication clearance.   - Monitor kidney function   - Advise avoiding NSAIDs    Peripheral Edema   Bilateral leg and foot swelling likely due to fluid retention. Compression  stockings and leg elevation recommended. Stockings need proper fitting. Salt intake should be limited to reduce fluid retention. Daily weight monitoring is advised to detect sudden increases indicating fluid retention. Compression  stockings should be worn in the morning before getting out of bed.   - Advise use of properly fitted compression stockings   - Encourage leg elevation when seated   - Advise limiting salt intake   - Monitor daily weight for sudden increases indicating fluid retention Notify provider for abrupt weight > 3 lbs. in one day or 5 lbs. in a week - Wear compression stockings in the morning before getting out of bed    Hypercalcemia   Previously elevated calcium levels during hospital stay. Calcium supplements discontinued. Rechecking calcium levels is planned to ensure she is decreasing.   - Order blood work to recheck calcium levels    Interstitial Lung Disease   Chronic lung disease requiring follow-up with pulmonologist Dr. Marchelle Gearing. Appointment scheduled for September but advised to check for cancellations for an earlier appointment if possible.   - Confirm appointment with Dr. Marchelle Gearing   - Check for earlier appointment availability    Chronic Lymphocytic Leukemia  Chronic lymphocytic leukemia requiring follow-up with oncologist Dr. Morrie Sheldon. No current medication for this condition.   - Confirm appointment with Dr. Rosaland Lao  - continue with PT/OT  - Fall and safety precaution advised   General Health Maintenance   Engagement in physical therapy and occupational therapy is encouraged to improve strength and mobility. Regular walking and exercise are advised to aid circulation and fluid management and maintain overall health.   - Engage in physical therapy and occupational therapy   - Encourage regular walking and exercise    Follow-up   Multiple follow-up appointments scheduled with specialists and for lab work. Coordination of care through MyChart  is essential to ensure all appointments are attended.   - Attend oncologist appointment on April 9th   - Attend gastroenter ologist appointment on May 7th   - Attend rheumatologist appointment on August 4th   - Schedule four-month follow-up appointment with primary care    Family/ staff Communication: Reviewed plan of care with patient verbalized understanding   Labs/tests ordered:  - CBC/diff  - BMP   Next Appointment: Return in about 4 months (around 10/28/2023) for medical mangement of chronic issues..   Total time: 30 minutes. Greater than 50% of total time spent doing patient education regarding post hospitalization follow up and health maintenance including symptom/medication management.   Caesar Bookman, NP

## 2023-06-29 ENCOUNTER — Telehealth: Payer: Self-pay

## 2023-06-29 LAB — CBC WITH DIFFERENTIAL/PLATELET
Absolute Lymphocytes: 1226 {cells}/uL (ref 850–3900)
Absolute Monocytes: 714 {cells}/uL (ref 200–950)
Basophils Absolute: 43 {cells}/uL (ref 0–200)
Basophils Relative: 0.7 %
Eosinophils Absolute: 268 {cells}/uL (ref 15–500)
Eosinophils Relative: 4.4 %
HCT: 34.1 % — ABNORMAL LOW (ref 35.0–45.0)
Hemoglobin: 11.4 g/dL — ABNORMAL LOW (ref 11.7–15.5)
MCH: 30.2 pg (ref 27.0–33.0)
MCHC: 33.4 g/dL (ref 32.0–36.0)
MCV: 90.2 fL (ref 80.0–100.0)
MPV: 12.1 fL (ref 7.5–12.5)
Monocytes Relative: 11.7 %
Neutro Abs: 3849 {cells}/uL (ref 1500–7800)
Neutrophils Relative %: 63.1 %
Platelets: 194 10*3/uL (ref 140–400)
RBC: 3.78 10*6/uL — ABNORMAL LOW (ref 3.80–5.10)
RDW: 12 % (ref 11.0–15.0)
Total Lymphocyte: 20.1 %
WBC: 6.1 10*3/uL (ref 3.8–10.8)

## 2023-06-29 LAB — BASIC METABOLIC PANEL WITH GFR
BUN/Creatinine Ratio: 16 (calc) (ref 6–22)
BUN: 20 mg/dL (ref 7–25)
CO2: 25 mmol/L (ref 20–32)
Calcium: 10 mg/dL (ref 8.6–10.4)
Chloride: 106 mmol/L (ref 98–110)
Creat: 1.27 mg/dL — ABNORMAL HIGH (ref 0.60–0.95)
Glucose, Bld: 95 mg/dL (ref 65–139)
Potassium: 4 mmol/L (ref 3.5–5.3)
Sodium: 143 mmol/L (ref 135–146)
eGFR: 42 mL/min/{1.73_m2} — ABNORMAL LOW (ref >=60)

## 2023-06-29 NOTE — Telephone Encounter (Signed)
 Copied from CRM [id number]. Topic: Clinical - Home Health Verbal Orders >> Jun 29, 2023  9:20 AM Hamdi H wrote: Caller/Agency: Sedonia Small Callback Number: [phone] Service Requested: Physical Therapy Frequency: 1 time a week for 4 weeks  Any new concerns about the patient? No she's doing pretty well.   Call returned to Onalee Hua, verbal ok given per [facility] standing protocol

## 2023-07-05 ENCOUNTER — Ambulatory Visit: Payer: Self-pay | Admitting: *Deleted

## 2023-07-05 DIAGNOSIS — T50905A Adverse effect of unspecified drugs, medicaments and biological substances, initial encounter: Secondary | ICD-10-CM

## 2023-07-05 DIAGNOSIS — M7989 Other specified soft tissue disorders: Secondary | ICD-10-CM

## 2023-07-05 NOTE — Telephone Encounter (Signed)
 Will send message to PCP as a [place]

## 2023-07-05 NOTE — Telephone Encounter (Signed)
   Chief Complaint: feet/leg swelling- still present- OV 06/28/23 Symptoms: feet/leg swelling- patient reports no better- she states she was expecting to get "fluid" pill last week- but did not get anything for her swelling. Elevating is not helping and she find it painful to wear her compression socks Frequency: ongoing - OV 06/28/23 Pertinent Negatives: Patient denies chest pain, breathing problems- she has developed a cough Disposition: [] ED /[] Urgent Care (no appt availability in office) / [] Appointment(In office/virtual)/ []  [facility]/ [] Home Care/ [x] Refused Recommended Disposition /[] [facility]/ []  Follow-up with PCP Additional Notes: Patient states she was in office last week- doesn't want to come back in if provider will send medication- see notes.   Copied from CRM [id number]. Topic: Clinical - Red Word Triage >> Jul 05, 2023  2:06 PM Alcus Dad H wrote: Red Word that prompted transfer to Nurse Triage: swelling in feet and legs, sometimes gets pain in her feet. Reason for Disposition  [1] MODERATE leg swelling (e.g., swelling extends up to knees) AND [2] new-onset or worsening  Answer Assessment - Initial Assessment Questions 1. ONSET: "When did the swelling start?" (e.g., minutes, hours, days)     Over 1 week 2. LOCATION: "What part of the leg is swollen?"  "Are both legs swollen or just one leg?"     Patient is wearing compression socks- they cause pain, both feet/legs 3. SEVERITY: "How bad is the swelling?" (e.g., localized; mild, moderate, severe)   - Localized: Small area of swelling localized to one leg.   - MILD pedal edema: Swelling limited to foot and ankle, pitting edema < 1/4 inch (6 mm) deep, rest and elevation eliminate most or all swelling.   - MODERATE edema: Swelling of lower leg to knee, pitting edema > 1/4 inch (6 mm) deep, rest and elevation only partially reduce swelling.   - SEVERE edema: Swelling extends above knee, facial or hand swelling  present.      Above the knee- moderate/ severe 4. REDNESS: "Does the swelling look red or infected?"     no 5. PAIN: "Is the swelling painful to touch?" If Yes, ask: "How painful is it?"   (Scale 1-10; mild, moderate or severe)     Sharp pain in the feet/legs- they comes and go 6. FEVER: "Do you have a fever?" If Yes, ask: "What is it, how was it measured, and when did it start?"      Patient sweats at night- not every night 7. CAUSE: "What do you think is causing the leg swelling?"     Fluid build up 8. MEDICAL HISTORY: "Do you have a history of blood clots (e.g., DVT), cancer, heart failure, kidney disease, or liver failure?"     Kidney disease 9. RECURRENT SYMPTOM: "Have you had leg swelling before?" If Yes, ask: "When was the last time?" "What happened that time?"     Always when in feet- no not this bad 10. OTHER SYMPTOMS: "Do you have any other symptoms?" (e.g., chest pain, difficulty breathing)       cough  Protocols used: Leg Swelling and Edema-A-AH

## 2023-07-06 ENCOUNTER — Other Ambulatory Visit: Payer: Self-pay | Admitting: Family

## 2023-07-06 DIAGNOSIS — T50905A Adverse effect of unspecified drugs, medicaments and biological substances, initial encounter: Secondary | ICD-10-CM

## 2023-07-06 DIAGNOSIS — M7989 Other specified soft tissue disorders: Secondary | ICD-10-CM

## 2023-07-06 MED ORDER — POTASSIUM CHLORIDE CRYS ER 20 MEQ PO TBCR: 20.0000 meq | EXTENDED_RELEASE_TABLET | Freq: Every day | ORAL | 1 refills | Status: DC | PRN

## 2023-07-06 MED ORDER — TORSEMIDE 20 MG PO TABS: 20.0000 mg | ORAL_TABLET | Freq: Every day | ORAL | 1 refills | Status: DC | PRN

## 2023-07-06 NOTE — Telephone Encounter (Signed)
 Start on torsemide 20 mg tablet one by mouth daily as needed for leg edema or abrupt weight gain > 3 lbs  Take along with Potassium chloride 20 meq tablet one by mouth daily as needed  - Keep legs elevated when seated  - Reduce salt intake in the diet  - Please schedule follow up appointment in two weeks to recheck edema and draw BMP

## 2023-07-06 NOTE — Telephone Encounter (Addendum)
 Outgoing call placed to patient, no answer, and no voicemail. We will need to make additional attempts to reach patient.   Side Notes:  1.) RX's sent to pharmacy 2.) Mychart message to patient with providers response

## 2023-07-06 NOTE — Addendum Note (Signed)
 Addended by: Maurice Small on: 07/06/2023 03:06 PM   Modules accepted: Orders

## 2023-07-07 NOTE — Telephone Encounter (Signed)
 Left message on voicemail for patient to return call when available   When patient returns call she needs to schedule a 2 week follow-up appointment for recheck swelling

## 2023-07-07 NOTE — Telephone Encounter (Signed)
 Patient scheduled appointment for 07/18/23

## 2023-07-11 ENCOUNTER — Other Ambulatory Visit: Payer: Self-pay

## 2023-07-11 DIAGNOSIS — C9110 Chronic lymphocytic leukemia of B-cell type not having achieved remission: Secondary | ICD-10-CM

## 2023-07-12 ENCOUNTER — Ambulatory Visit (HOSPITAL_COMMUNITY)
Admission: RE | Admit: 2023-07-12 | Discharge: 2023-07-12 | Disposition: A | Discharge status: Home / Self Care | Source: Ambulatory Visit | Attending: Hematology | Admitting: Hematology

## 2023-07-12 ENCOUNTER — Inpatient Hospital Stay (HOSPITAL_BASED_OUTPATIENT_CLINIC_OR_DEPARTMENT_OTHER): Payer: Medicare Other | Admitting: Hematology

## 2023-07-12 ENCOUNTER — Inpatient Hospital Stay: Payer: Medicare Other

## 2023-07-12 VITALS — BP 159/70 | HR 74 | Temp 97.4°F | Resp 18 | Ht 59.0 in | Wt 149.0 lb

## 2023-07-12 DIAGNOSIS — C9110 Chronic lymphocytic leukemia of B-cell type not having achieved remission: Secondary | ICD-10-CM

## 2023-07-12 DIAGNOSIS — R0602 Shortness of breath: Secondary | ICD-10-CM | POA: Insufficient documentation

## 2023-07-12 DIAGNOSIS — D591 Autoimmune hemolytic anemia, unspecified: Secondary | ICD-10-CM | POA: Insufficient documentation

## 2023-07-12 DIAGNOSIS — Z806 Family history of leukemia: Secondary | ICD-10-CM | POA: Insufficient documentation

## 2023-07-12 LAB — CBC WITH DIFFERENTIAL (CANCER CENTER ONLY)
Abs Immature Granulocytes: 0.01 10*3/uL (ref 0.00–0.07)
Basophils Absolute: 0.1 10*3/uL (ref 0.0–0.1)
Basophils Relative: 1 %
Eosinophils Absolute: 0.2 10*3/uL (ref 0.0–0.5)
Eosinophils Relative: 3 %
HCT: 35.6 % — ABNORMAL LOW (ref 36.0–46.0)
Hemoglobin: 11.8 g/dL — ABNORMAL LOW (ref 12.0–15.0)
Immature Granulocytes: 0 %
Lymphocytes Relative: 19 %
Lymphs Abs: 1.3 10*3/uL (ref 0.7–4.0)
MCH: 29.4 pg (ref 26.0–34.0)
MCHC: 33.1 g/dL (ref 30.0–36.0)
MCV: 88.8 fL (ref 80.0–100.0)
Monocytes Absolute: 0.8 10*3/uL (ref 0.1–1.0)
Monocytes Relative: 12 %
Neutro Abs: 4.3 10*3/uL (ref 1.7–7.7)
Neutrophils Relative %: 65 %
Platelet Count: 217 10*3/uL (ref 150–400)
RBC: 4.01 MIL/uL (ref 3.87–5.11)
RDW: 11.9 % (ref 11.5–15.5)
WBC Count: 6.7 10*3/uL (ref 4.0–10.5)
nRBC: 0 % (ref 0.0–0.2)

## 2023-07-12 LAB — CMP (CANCER CENTER ONLY)
ALT: 18 U/L (ref 0–44)
AST: 27 U/L (ref 15–41)
Albumin: 4.3 g/dL (ref 3.5–5.0)
Alkaline Phosphatase: 80 U/L (ref 38–126)
Anion gap: 7 (ref 5–15)
BUN: 32 mg/dL — ABNORMAL HIGH (ref 8–23)
CO2: 27 mmol/L (ref 22–32)
Calcium: 10.8 mg/dL — ABNORMAL HIGH (ref 8.9–10.3)
Chloride: 102 mmol/L (ref 98–111)
Creatinine: 1.56 mg/dL — ABNORMAL HIGH (ref 0.44–1.00)
GFR, Estimated: 33 mL/min — ABNORMAL LOW (ref >60)
Glucose, Bld: 130 mg/dL — ABNORMAL HIGH (ref 70–99)
Potassium: 4.5 mmol/L (ref 3.5–5.1)
Sodium: 136 mmol/L (ref 135–145)
Total Bilirubin: 1.1 mg/dL (ref 0.0–1.2)
Total Protein: 7.3 g/dL (ref 6.5–8.1)

## 2023-07-12 NOTE — Progress Notes (Signed)
 HEMATOLOGY/ONCOLOGY CLINIC NOTE  Date of Service: 07/12/23  Patient Care Team: Ngetich, Elijio Guadeloupe, NP as PCP - General (Family Medicine) Lenton Rail, MD as Consulting Physician (Otolaryngology) Serafin Dames, MD as Consulting Physician (Gastroenterology) Coy Ditty Adelaida Adie, MD (Inactive) as Consulting Physician (Obstetrics and Gynecology) Albert Huff, MD as Consulting Physician (Ophthalmology) Monika Annas, [facility] (Inactive) as Pharmacist (Pharmacist)  CHIEF COMPLAINTS/PURPOSE OF CONSULTATION:  F/u for CLL/SLL  Prior Therapy:  She is status post bone marrow biopsy completed on March 05, 2020.    Prednisone 60 mg daily in the January 2022 to treat her hemolytic anemia.  This was tapered off after complete hematological response.  HISTORY OF PRESENTING ILLNESS:   Jacqueline Hancock is a wonderful 83 y.o. female who is here for continued evaluation and management of NHL, unspecified body region. Patient was following up with Dr. Dirk Fredericks.   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 BM Bx Cyclin D1 IHC +ve byt FISH neg . She has presented with splenomegaly, anemia, and lymphocytosis. 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   Patient's current treatment is Imbruvica 280 mg. She was started on Imbruvica 560 mg on January 2022, which was then reduced to 280 mg in July 2023.   Patient was last seen by Dr. Dirk Fredericks on 02/10/2022 and she was doing well overall.   Patient reports she has been doing well overall since our last visit. She does complains of itchy skin rashes, mostly around the face and head, for around 2-3 weeks ago. Patient notes that she was started couple of new medications from other physicians, but she is unsure of the names. Patient notes that she was started on oxycodone around 3-4 weeks ago. She discontinued oxycodone.   She notes that there is redness around her face, which is not  new.   Patient is currently taking Imbruvica 280 mg as prescribed and has been tolerating it well without any severe toxicities.   She is currently taking Iron supplement, calcium supplement, and Vitamin-D supplement.   She denies fever, chills, night sweats, unexpected weight loss, back pain, abdominal pain, chest pain, or leg swelling. Patient does complain of occasional abdominal pain.   Patient notes she had a surgery near her right neck/ear area to remove an enlarged lymph node couple years ago. She complains of mild occasional pain near the site.  INTERVAL HISTORY:  Jacqueline Hancock is a wonderful 83 y.o. 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   Patient was last seen by me on 03/14/2023 and was doing well overall with no new medical complaints.   Today, she is accompanied by her daughter. Patient presented to the ED on 06/02/2023 for rib pain on the right side as well as headaches. She reports having a fall prior to that visit with injury from trauma to the right side of the chest after falling from a chair. X-ray of the ribs showed no obvious rib fracture.   Patient reports having a second fall causing her to have a second hospitalization on March 12. Her hgb was normal at that time though her calcium was elevated. 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 CT head showed no injuries to the head or neck. Patient denies any head injury at that time. She is working with physical therapy.  Patient reports having very elevated calcium levels recently. Calcium was noted to be 14.8 four weeks ago. She was taking calcium and vitamin D supplementation at the time of her elevated calcium levels.  Patient reports that she has been off of vitamin D for 1 month. Her calcium has since decreased to 10.8 today. She is taking vitamin B complex regularly.   Patient reports having leg  weakness at the time that she was noted to have high calcium.  Patient reports having an appointment with a urologist in May.   Her SOB has been fairly stable since her hospitalization. She reports having breathing issues last week and notes that her SOB was worse 1-2 days ago, but has improved today. Patient complains of coughing and wheezing. She denies any fever or chills.   Patient reports that she is able to walk much shorter distances than previously due to recent changes in her breathing habits since her falls. Patient reports that she is needing to use a walker inside the home.   She complains of a new cough and denies any phlegm with cough. Her nasal discharge is clear/white, which is improved with Claritin. Patient tends to have seasonal allergies. She does not feel that her nasal congestion is adding to her SOB.  She complains of abdominal bloating. Patient denies any abdominal pain on palpation. Patient reports that her bowels are sometimes harder and she does not have a daily bowel movement. Patient reports having Mirolax and Senna available at home, which she is not taking.   She reports that her night sweats are a new symptom, and have been present since at least 2 weeks ago. Patient reports that she constantly feels cold.   She complains of fatigue. She is eating well and staying well-hydrated. Patient denies having much salt in her diet.   Patient complains of leg swelling. She has never needed Torsemide in past and is not taking Torsemide at this time. She reports that she does frequently keep her legs elevated. Patient reports frequent urination at night.  She denies any new lumps/bumps or pain in the calves. Patient sometimes has pain in her side of her leg.   She complains of the bottom of her feet feeling like she is walking on rocks.   Patient reports having elevated blood pressure and cholesterol recently.  MEDICAL HISTORY:  Past Medical History:  Diagnosis Date    Dyspnea 2021   with exersion    Early cataracts, bilateral    Elevated hemoglobin A1c    Fibroids    GERD (gastroesophageal reflux disease)    Hyperlipidemia    Hypertension    Lymphoma (HCC)    Osteopenia     SURGICAL HISTORY: Past Surgical History:  Procedure Laterality Date   ABDOMINAL HYSTERECTOMY     APPENDECTOMY     CATARACT EXTRACTION, BILATERAL Bilateral 09/2017   Dr. 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   TONSILLECTOMY AND ADENOIDECTOMY      SOCIAL HISTORY: Social History   Socioeconomic History   Marital status: Widowed    Spouse name: Not on file   Number of children: Not on file   Years of education: Not on file   Highest education level: Not on file  Occupational History   Not on file  Tobacco Use   Smoking status: Never   Smokeless tobacco: Never  Vaping Use   Vaping status: Never Used  Substance and Sexual Activity   Alcohol use: No  Drug use: Never   Sexual activity: Not on file  Other Topics Concern   Not on file  Social History Narrative   Not on file   Social Drivers of Health   Financial Resource Strain: Not on file  Food Insecurity: No Food Insecurity (06/15/2023)   Hunger Vital Sign    Worried About Running Out of Food in the Last Year: Never true    Ran Out of Food in the Last Year: Never true  Transportation Needs: No Transportation Needs (06/15/2023)   PRAPARE - Administrator, Civil Service (Medical): No    Lack of Transportation (Non-Medical): No  Physical Activity: Not on file  Stress: Not on file  Social Connections: Patient Unable To Answer (06/15/2023)   Social Connection and Isolation Panel [NHANES]    Frequency of Communication with Friends and Family: Patient unable to answer    Frequency of Social Gatherings with Friends and Family: Patient unable to answer    Attends Religious Services: Patient unable to answer    Active Member of Clubs or Organizations: Patient unable to answer     Attends Banker Meetings: Patient unable to answer    Marital Status: Patient unable to answer  Intimate Partner Violence: Not At Risk (06/15/2023)   Humiliation, Afraid, Rape, and Kick questionnaire    Fear of Current or Ex-Partner: No    Emotionally Abused: No    Physically Abused: No    Sexually Abused: No    FAMILY HISTORY: Family History  Problem Relation Age of Onset   Leukemia Mother    Hypertension Father    Diabetes Sister    Heart disease Brother    Diabetes Brother    Diabetes Brother    Hypertension Son     ALLERGIES:  is allergic to nexium [esomeprazole magnesium] and peanut butter flavoring agent (non-screening).  MEDICATIONS:  Current Outpatient Medications  Medication Sig Dispense Refill   amLODipine (NORVASC) 5 MG tablet Take 1 tablet (5 mg total) by mouth daily. 30 tablet 3   B Complex Vitamins (VITAMIN B-COMPLEX PO) Take 1 tablet by mouth daily.     Ferrous Sulfate (IRON) 325 (65 Fe) MG TABS Take 325 mg by mouth daily.      Loratadine (CLARITIN PO) Take by mouth daily.     nadolol (CORGARD) 20 MG tablet TAKE 1 TABLET BY MOUTH DAILY FOR BLOOD PRESSURE OR TREMORS 90 tablet 3   ondansetron (ZOFRAN) 4 MG tablet Take 1 tablet (4 mg total) by mouth every 8 (eight) hours as needed for nausea or vomiting. 20 tablet 0   polyethylene glycol (MIRALAX / GLYCOLAX) 17 g packet Take 17 g by mouth daily as needed for mild constipation or moderate constipation. Also available OTC 30 each 0   potassium chloride SA (KLOR-CON M) 20 MEQ tablet TAKE 1 TABLET BY MOUTH EVERY DAY AS NEEDED 90 tablet 0   senna-docusate (SENOKOT-S) 8.6-50 MG tablet Take 2 tablets by mouth at bedtime. For constipation 60 tablet 1   torsemide (DEMADEX) 20 MG tablet TAKE 1 TABLET BY MOUTH EVERY DAY AS NEEDED FOR LEG EDEMA OR ABRUPT WEIGHT GAIN 90 tablet 0   traMADol (ULTRAM) 50 MG tablet Take 1 tablet (50 mg total) by mouth every 8 (eight) hours as needed for moderate pain (pain score 4-6) or  severe pain (pain score 7-10) (pain). 20 tablet 0   No current facility-administered medications for this visit.    REVIEW OF SYSTEMS:    10 Point  review of Systems was done is negative except as noted above.   PHYSICAL EXAMINATION: ECOG PERFORMANCE STATUS: 2 - Symptomatic, <50% confined to bed  . Vitals:   07/12/23 0927  BP: (!) 159/70  Pulse: 74  Resp: 18  Temp: (!) 97.4 F (36.3 C)  SpO2: 100%     Filed Weights   07/12/23 0927  Weight: 149 lb (67.6 kg)    .Body mass index is 30.09 kg/m.   GENERAL:alert, in no acute distress and comfortable SKIN: no acute rashes, no significant lesions EYES: conjunctiva are pink and non-injected, sclera anicteric OROPHARYNX: MMM, no exudates, no oropharyngeal erythema or ulceration NECK: supple, no JVD LYMPH:  no palpable lymphadenopathy in the cervical, axillary or inguinal regions LUNGS: clear to auscultation b/l with normal respiratory effort HEART: regular rate & rhythm ABDOMEN:  normoactive bowel sounds , non tender, not distended. Extremity: no pedal edema PSYCH: alert & oriented x 3 with fluent speech NEURO: no focal motor/sensory deficits   LABORATORY DATA:  I have reviewed the data as listed  .    Latest Ref Rng & Units 07/17/2023    9:12 AM 07/12/2023    8:57 AM 06/28/2023   11:48 AM  CBC  WBC 4.0 - 10.5 K/uL 7.7  6.7  6.1   Hemoglobin 12.0 - 15.0 g/dL 96.0  45.4  09.8   Hematocrit 36.0 - 46.0 % 32.9  35.6  34.1   Platelets 150 - 400 K/uL 226  217  194    .    Latest Ref Rng & Units 07/17/2023    9:12 AM 07/12/2023    8:57 AM 06/28/2023   11:48 AM  CMP  Glucose 70 - 99 mg/dL 97  119  95   BUN 8 - 23 mg/dL 26  32  20   Creatinine 0.44 - 1.00 mg/dL 1.47  8.29  5.62   Sodium 135 - 145 mmol/L 138  136  143   Potassium 3.5 - 5.1 mmol/L 4.5  4.5  4.0   Chloride 98 - 111 mmol/L 105  102  106   CO2 22 - 32 mmol/L 24  27  25    Calcium 8.9 - 10.3 mg/dL 13.0  86.5  78.4   Total Protein 6.5 - 8.1 g/dL 6.8  7.3     Total Bilirubin 0.0 - 1.2 mg/dL 1.2  1.1    Alkaline Phos 38 - 126 U/L 72  80    AST 15 - 41 U/L 27  27    ALT 0 - 44 U/L 18  18     .No results found for: "LDH"   PATHOLOGY Surgical Pathology CASE: [id number] PATIENT: Karelyn Fuelling Bone Marrow Report     Clinical History: lymphocytosis, possible lymphoma     DIAGNOSIS:  BONE MARROW, ASPIRATE, CLOT, CORE: -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  The latter is primarily seen by flow cytometry.  The findings favor mantle cell lymphoma but correlation with cytogenetic and FISH studies is recommended.  MICROSCOPIC DESCRIPTION:  PERIPHERAL BLOOD SMEAR: The red blood cells display prominent anisocytosis with macrocytic and normocytic cells.  There is mild to moderate poikilocytosis with teardrop cells, elliptocytes.  There is moderate polychromasia.  The white blood cells are increased in number with lymphocytosis characterized  by predominance of small to medium sized lymphoid cells displaying high nuclear cytoplasmic ratio, round to irregular nuclei, coarse chromatin and small to inconspicuous nucleoli. The platelets are normal in number.     RADIOGRAPHIC STUDIES: I have personally reviewed the radiological images as listed and agreed with the findings in the report. DG Abd Portable 1V Result Date: 06/17/2023 CLINICAL DATA:  Ileus. EXAM: PORTABLE ABDOMEN - 1 VIEW COMPARISON:  CT, 06/14/2023. FINDINGS: Normal bowel gas pattern. Specifically, no evidence of obstruction or significant adynamic ileus. Rounded calcification projects to the left of the L1-L2 disc interspace reflecting the collecting  system stone noted on the CT from 06/14/2023. Skeletal structures are unremarkable. IMPRESSION: 1. No acute findings. No evidence of bowel obstruction or significant adynamic ileus. 2. Left upper quadrant calcification consistent with a left renal pelvis stone. Electronically Signed   By: Amanda Jungling M.D.   On: 06/17/2023 09:25   CT Head Wo Contrast Result Date: 06/14/2023 CLINICAL DATA:  Neck trauma (Age >= 65y); Head trauma, minor (Age >= 65y) fall. EXAM: CT HEAD WITHOUT CONTRAST CT CERVICAL SPINE WITHOUT CONTRAST TECHNIQUE: Multidetector CT imaging of the head and cervical spine was performed following the standard protocol without intravenous contrast. Multiplanar CT image reconstructions of the cervical spine were also generated. RADIATION DOSE REDUCTION: This exam was performed according to the departmental dose-optimization program which includes automated exposure control, adjustment of the mA and/or kV according to patient size and/or use of iterative reconstruction technique. COMPARISON:  CT head 11/11/2013 FINDINGS: CT HEAD FINDINGS Brain: No evidence of large-territorial acute infarction. No parenchymal hemorrhage. No mass lesion. No extra-axial collection. No mass effect or midline shift. No hydrocephalus. Basilar cisterns are patent. Vascular: No hyperdense vessel. Skull: No acute fracture or focal lesion. Sinuses/Orbits: Paranasal sinuses and mastoid air cells are clear. The orbits are unremarkable. Other: None. CT CERVICAL SPINE FINDINGS Alignment: Normal. Skull base and vertebrae: Multilevel severe degenerative changes of the spine with posterior disc osteophyte complex formation and posterior longitudinal ligament calcification at the C4-C5 level. No severe osseous neural foraminal or central canal stenosis. No acute fracture. No aggressive appearing focal osseous lesion or focal pathologic process. Soft tissues and spinal canal: No prevertebral fluid or swelling. No visible canal hematoma.  Upper chest: Unremarkable. Other: None. IMPRESSION: 1. No acute intracranial abnormality. 2. No acute displaced fracture or traumatic listhesis of the cervical spine. Electronically Signed   By: Morgane  Naveau M.D.   On: 06/14/2023 23:18   CT Cervical Spine Wo Contrast Result Date: 06/14/2023 CLINICAL DATA:  Neck trauma (Age >= 65y); Head trauma, minor (Age >= 65y) fall. EXAM: CT HEAD WITHOUT CONTRAST CT CERVICAL SPINE WITHOUT CONTRAST TECHNIQUE: Multidetector CT imaging of the head and cervical spine was performed following the standard protocol without intravenous contrast. Multiplanar CT image reconstructions of the cervical spine were also generated. RADIATION DOSE REDUCTION: This exam was performed according to the departmental dose-optimization program which includes automated exposure control, adjustment of the mA and/or kV according to patient size and/or use of iterative reconstruction technique. COMPARISON:  CT head 11/11/2013 FINDINGS: CT HEAD FINDINGS Brain: No evidence of large-territorial acute infarction. No parenchymal hemorrhage. No mass lesion. No extra-axial collection. No mass effect or midline shift. No hydrocephalus. Basilar cisterns are patent. Vascular: No hyperdense vessel. Skull: No acute fracture or focal lesion. Sinuses/Orbits: Paranasal sinuses and mastoid air cells are clear. The orbits are unremarkable. Other: None. CT CERVICAL SPINE FINDINGS Alignment: Normal. Skull base and vertebrae: Multilevel severe degenerative changes of  the spine with posterior disc osteophyte complex formation and posterior longitudinal ligament calcification at the C4-C5 level. No severe osseous neural foraminal or central canal stenosis. No acute fracture. No aggressive appearing focal osseous lesion or focal pathologic process. Soft tissues and spinal canal: No prevertebral fluid or swelling. No visible canal hematoma. Upper chest: Unremarkable. Other: None. IMPRESSION: 1. No acute intracranial  abnormality. 2. No acute displaced fracture or traumatic listhesis of the cervical spine. Electronically Signed   By: Morgane  Naveau M.D.   On: 06/14/2023 23:18   CT ABDOMEN PELVIS W CONTRAST Result Date: 06/14/2023 CLINICAL DATA:  Abdominal trauma, blunt The pt has had 3 falls since yesterday and she reports no injury to her head she is also c/o dizziness sl nausea she is also c/o chest pain EXAM: CT ABDOMEN AND PELVIS WITH CONTRAST TECHNIQUE: Multidetector CT imaging of the abdomen and pelvis was performed using the standard protocol following bolus administration of intravenous contrast. RADIATION DOSE REDUCTION: This exam was performed according to the departmental dose-optimization program which includes automated exposure control, adjustment of the mA and/or kV according to patient size and/or use of iterative reconstruction technique. CONTRAST:  60mL OMNIPAQUE IOHEXOL 350 MG/ML SOLN COMPARISON:  None Available. FINDINGS: Lower chest: No acute abnormality.  Small hiatal hernia. Hepatobiliary: Not enlarged. No focal lesion. No laceration or subcapsular hematoma. The gallbladder is otherwise unremarkable with no radio-opaque gallstones. No biliary ductal dilatation. Pancreas: Normal pancreatic contour. No main pancreatic duct dilatation. Spleen: Not enlarged. No focal lesion. No laceration, subcapsular hematoma, or vascular injury. Adrenals/Urinary Tract: No nodularity bilaterally. The left kidney enhances homogeneously. Shattered right superior renal pole with associated 12 x 8 x 16 cm perinephric hematoma. Nonenhancing right superior renal pole. Concern for main renal artery avulsion. No definite extravasation of excreted intravenous contrast on delayed imaging from the collecting system. No definite findings of lumen on delayed imaging to suggest active hemorrhage. No hydroureter. The urinary bladder is unremarkable. Stomach/Bowel: No small or large bowel wall thickening or dilatation. The appendix is not  definitely identified with no inflammatory changes in the right lower quadrant to suggest acute appendicitis. Vasculature/Lymphatics: No abdominal aorta or iliac aneurysm. No active contrast extravasation or pseudoaneurysm. No abdominal, pelvic, inguinal lymphadenopathy. Reproductive: Normal. Other: No simple free fluid ascites. No pneumoperitoneum. No organized fluid collection. Musculoskeletal: No significant soft tissue hematoma. No acute pelvic fracture. No spinal fracture. Other ports and devices: None. IMPRESSION: 1. 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 2. No acute fracture or traumatic malalignment of the lumbar spine. These results were called by telephone at the time of interpretation on 06/14/2023 at 11:10 pm to provider Dr. Nolia Baumgartner, who verbally acknowledged these results. Electronically Signed   By: Morgane  Naveau M.D.   On: 06/14/2023 23:13   DG Hip Unilat W or Wo Pelvis 2-3 Views Left Result Date: 06/14/2023 CLINICAL DATA:  Pain, recurrent falls. EXAM: DG HIP (WITH OR WITHOUT PELVIS) 2-3V LEFT COMPARISON:  None Available. FINDINGS: No fracture of the pelvis or left hip. No left hip dislocation. Intact pubic rami. Minor acetabular spurring. Pubic symphysis and sacroiliac joints are congruent. IMPRESSION: 1. No fracture or dislocation of the pelvis or left hip. 2. Minor degenerative acetabular spurring. Electronically Signed   By: Chadwick Colonel M.D.   On: 06/14/2023 20:10   DG Knee 2 Views Right Result Date: 06/14/2023 CLINICAL DATA:  Pain.  Recurrent falls. EXAM: RIGHT KNEE - 1-2 VIEW COMPARISON:  None Available. FINDINGS:  No evidence of fracture, dislocation, or joint effusion. Mild medial tibiofemoral joint space narrowing. Mild patellofemoral spurring. Moderate quadriceps and patellar tendon enthesophytes. IMPRESSION: 1. No fracture or subluxation of the right knee. 2. Mild osteoarthritis. Electronically Signed   By: Chadwick Colonel M.D.   On: 06/14/2023 20:09    DG Knee 2 Views Left Result Date: 06/14/2023 CLINICAL DATA:  Pain.  Recurrent falls. EXAM: LEFT KNEE - 1-2 VIEW COMPARISON:  None Available. FINDINGS: No evidence of fracture, dislocation, or joint effusion. Mild medial tibiofemoral joint space narrowing. Mild patellofemoral spurring. Quadriceps and patellar tendon enthesophytes. IMPRESSION: Mild osteoarthritis. No acute fracture or subluxation. Electronically Signed   By: Chadwick Colonel M.D.   On: 06/14/2023 20:08   DG Chest 2 View Result Date: 06/14/2023 CLINICAL DATA:  Recurrent falls. EXAM: CHEST - 2 VIEW COMPARISON:  06/02/2023 FINDINGS: The cardiomediastinal contours are normal. Subpleural changes on prior exam are not as well demonstrated. Pulmonary vasculature is normal. No consolidation, pleural effusion, or pneumothorax. No acute osseous abnormalities are seen. IMPRESSION: No acute findings. Electronically Signed   By: Chadwick Colonel M.D.   On: 06/14/2023 20:07    ASSESSMENT & PLAN:   83 y.o. woman with:   1.  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 Though discussed with patient that FISH was neg and so Mantle cell lymphoma not confirm. Findings more consistent with CLL.  2.  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  PLAN:  -Discussed lab results on 07/12/23 in detail with patient. CBC showed WBC of 6.7K, hemoglobin of 11.8, and platelets of 217K. -CBC stable  -her hgb, which was previously in the 10s, has progressively increased to 12 -patient is not likely to have any bleeding at this time -patient is not still bleeding in regards to her kidneys and from CLL standpoint -WBC normal -there is no increase in lymphocytes in the blood -her previously low platelets have normalized -there is no sugggestion of lymphoma recurrence based on blood counts -CMP shows signs of possible dehydration with  elevated kidney numbers. Her calcium is mildly increased at 10.8 mg/dL, which is in the upper limits of normal -there is no obvious evidence of lymphoma based on her abdominal scan  -discussed that the IV fluid that she recently received to bring down her calcium could cause SOB and leg swelling -discussed that constipation and fullness may cause SOB  -Discussed that her leg pain could be from a pinched nerve in the back -discussed that bleeds/injuries could cause night sweats, though we would also want to ensure that there is no concern for lymphoma -educated patient that oral iron can be constipating -take Mirolax and Senna S to improve constipation -recommend drinking at least 2L of water daily -educated patient that there are several factors that could be causing high calcium, such as: Excessive parathyroid hormone levels, though patient's PTH level was suppressed. There was also no elevation in PTH-related peptide. Elevated activated vitamin D levels- discussed that there is some uncertainty in regards to whether patient may be taking too much calcium or if there is a tumor involved.  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 She shall have a discussion with her PCP in regards to consideration of adjusting her blood pressure  medication to manage leg swelling.  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 -recommend connecting with her lung doctor sooner if there is more prominance of interstitial lung disease  -patient has a scheduled CT chest scan ordered by Dr. Bertrum Brodie for interstitial lung disease -discussed  that I did hear some crackling in the lungs and we would want to ensure that there is no concern for pneumonia with her upcoming CT chest scan.  -recommend whole body PET/CT scan to ensure there is no concern for lymphoma recurrence in regards to elevated calcium while off of Ibrutinib -continue to hold off Ibrutinib and continue to monitor -patient is inclined to receive additional labs when she returns for her PET scan -Continue to follow-up with Pulmonologist and Dermatologist.  -answered all of patient's and her daughter's questions in detail  FOLLOW-UP: CXR today PET/CT in 1 week Labs 1 week RTC with Dr Salomon Cree in 2 weeks  The total time spent in the appointment was 40 minutes* .  All of the patient's questions were answered with apparent satisfaction. The patient knows to call the clinic with any problems, questions or concerns.   Jacquelyn Matt MD MS AAHIVMS [facility] Hematology/Oncology Physician [facility]  .*Total Encounter Time as defined by the Centers for Medicare and Medicaid Services includes, in addition to the face-to-face time of a patient visit (documented in the note above) non-face-to-face time: obtaining and reviewing outside history, ordering and reviewing medications, tests or procedures, care coordination (communications with other health care professionals or caregivers) and documentation in the medical record.   I,Mitra Faeizi,acting as a Neurosurgeon for Jacquelyn Matt, MD.,have documented all relevant documentation on the behalf of Jacquelyn Matt, MD,as directed by  Jacquelyn Matt, MD while in the presence of Jacquelyn Matt, MD.  .I have reviewed the above documentation for accuracy and completeness, and I agree with the above. .Abigail Teall Kishore Leyton Brownlee MD

## 2023-07-13 ENCOUNTER — Telehealth: Payer: Self-pay | Admitting: Hematology

## 2023-07-13 NOTE — Progress Notes (Incomplete)
 HEMATOLOGY/ONCOLOGY CLINIC NOTE  Date of Service: 07/12/23  Patient Care Team: Ngetich, Donalee Citrin, NP as PCP - General (Family Medicine) Flo Shanks, MD as Consulting Physician (Otolaryngology) Sharrell Ku, MD as Consulting Physician (Gastroenterology) Pennie Rushing Maris Berger, MD (Inactive) as Consulting Physician (Obstetrics and Gynecology) Jethro Bolus, MD as Consulting Physician (Ophthalmology) Charlett Nose, [facility] (Inactive) as Pharmacist (Pharmacist)  CHIEF COMPLAINTS/PURPOSE OF CONSULTATION:  F/u for CLL/SLL  Prior Therapy:  She is status post bone marrow biopsy completed on March 05, 2020.    Prednisone 60 mg daily in the January 2022 to treat her hemolytic anemia.  This was tapered off after complete hematological response.  HISTORY OF PRESENTING ILLNESS:   Jacqueline Hancock is a wonderful 83 y.o. female who is here for continued evaluation and management of NHL, unspecified body region. Patient was following up with Dr. Clelia Croft.   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 BM Bx Cyclin D1 IHC +ve byt FISH neg . She has presented with splenomegaly, anemia, and lymphocytosis. 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   Patient's current treatment is Imbruvica 280 mg. She was started on Imbruvica 560 mg on January 2022, which was then reduced to 280 mg in July 2023.   Patient was last seen by Dr. Clelia Croft on 02/10/2022 and she was doing well overall.   Patient reports she has been doing well overall since our last visit. She does complains of itchy skin rashes, mostly around the face and head, for around 2-3 weeks ago. Patient notes that she was started couple of new medications from other physicians, but she is unsure of the names. Patient notes that she was started on oxycodone around 3-4 weeks ago. She discontinued oxycodone.   She notes that there is redness around her face, which is not  new.   Patient is currently taking Imbruvica 280 mg as prescribed and has been tolerating it well without any severe toxicities.   She is currently taking Iron supplement, calcium supplement, and Vitamin-D supplement.   She denies fever, chills, night sweats, unexpected weight loss, back pain, abdominal pain, chest pain, or leg swelling. Patient does complain of occasional abdominal pain.   Patient notes she had a surgery near her right neck/ear area to remove an enlarged lymph node couple years ago. She complains of mild occasional pain near the site.  INTERVAL HISTORY:  Jacqueline Hancock is a wonderful 83 y.o. 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   Patient was last seen by me on 03/14/2023 and was doing well overall with no new medical complaints.   Today, she is accompanied by her daughter. Patient presented to the ED on 06/02/2023 for rib pain on the right side as well as headaches. She reports having a fall prior to that visit with injury to the right side of the chest. X-ray of the ribs showed no obvious rib fracture.   Patient reports having a second fall causing her to have a second hospitalization on March 12. Her hgb was normal at that time though her calcium was elevated. 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 CT head showed no injuries to the head or neck. Patient denies any head injury at that time. She is working with physical therapy.   Patient reports having very elevated calcium  levels recently. Calcium was noted to be 14.8 four weeks ago. She was taking calcium and vitamin D supplementation at the time of her elevated calcium levels.  Patient reports that she has been off of vitamin D for 1 month. Her calcium has since decreased to 10.8 today. She is taking vitamin B complex regularly.   Patient reports having leg weakness at the time that she was noted to  have high calcium.  Patient reports having an appointment with a urologist in May.   Her SOB has been fairly stable since her hospitalization. She reports having breathing issues last week and notes that her SOB was worse 1-2 days ago, but has improved today. Patient complains of coughing and wheezing. She denies any fever or chills.   Patient reports that she is able to walk much shorter distances than previously due to recent changes in her breathing habits since her falls. Patient reports that she is needing to use a walker inside the home.   She complains of a new cough and denies any phlegm with cough. Her nasal discharge is clear/white, which is improved with Claritin. Patient tends to have seasonal allergies. She does not feel that her nasal congestion is adding to her SOB.  She complains of abdominal bloating. Patient denies any abdominal pain on palpation. Patient reports that her bowels are sometimes harder and she does not have a daily bowel movement. Patient reports having Mirolax and Senna available at home, which she is not taking.   She reports that her night sweats are a new symptom, and have been present since at least 2 weeks ago. Patient reports that she constantly feels cold.   She complains of fatigue. She is eating well and staying well-hydrated. Patient denies having much salt in her diet.   Patient complains of leg swelling. She has never needed Torsemide in past and is not taking Torsemide at this time. She reports that she does frequently keep her legs elevated. Patient reports frequent urination at night.  She denies any new lumps/bumps or pain in the calves. Patient sometimes has pain in her side of her leg.   She complains of the bottom of her feet feeling like she is walking on rocks.        -discussed that the IV fluid that she received to bring down her calcium could cause SOB and leg swelling           -Discussed that her interstitial lung  disease is mildly more prominent on her recent chest X-ray. -recommend connecting with her lung doctor sooner if there is more prominance of interstitial lung disease  -patient has a scheduled CT chest scan ordered by Dr. Marchelle Gearing for interstitial lung disease   -discussed that constipation and fullness may cause SOB   -educated patient that oral iron can be constipating  -discussed that bleeds/injuries could cause night sweats, though we would also want to ensure that there is no concern for lymphoma               -Discussed that her leg pain could be from a pinched nerve in the back   Patient reports having elevated blood pressure and cholesterol recently.           Fall in late [facility] twice Pain in side Falling from chair Pain from trauama  Tear in internal bleed After the fall        -Discussed lab results on 07/12/23 in detail with patient. CBC  showed WBC of 6.7K, hemoglobin of 11.8, and platelets of 217K.  -CBC stable  -her hgb, which was previously in the 10s, has progressively increased to 12 -patient is not likely to have any bleeding at this time -patient is not still bleeding in regards to her kidneys and from CLL standpoint -WBC normal -there is no increase in lymphocytes in the blood -her previously low platelets have normalized -there is no sugggestion of lymphoma recurrence based on blood counts -CMP shows signs of possible dehydration with elevated kidney numbers. Her calcium is mildly increased at 10.8 mg/dL, which is in the upper limits of normal  -there is no obvious evidence of lymphoma based on her abdominal scan   -take Mirolax and Senna S to improve constipation  -recommend drinking at least 2L of water daily  -educated patient that there are several factors that could be causing high calcium, such as: Excessive parathyroid hormone levels, though patient's PTH level was suppressed. There was also no elevation  in PTH-related peptide. Elevated activated vitamin D levels- discussed that there is some uncertainty in regards to whether patient may be taking too much calcium or if there is a tumor involved.  Lymphomas and certain inflammatory disorders of the lung Significant dehydration - patient is not very dehydrated overall -do not take extra calcium supplementation    -recommend whole body PET/CT scan to ensure there is no concern for lymphoma recurrence in regards to elevated calcium while off of ibrutinib  -discussed that I did hear some crackling in the lungs and we would want to ensure that there is no concern for pneumonia with her upcoming CT chest scan.   -patient is inclined to receive additional labs when she returns for her PET scan -start low-dose diuretic  -discussed that her leg sweliing can be from amlodipine. She shall have a discussion with her PCP in regards to consideration of adjusting her blood pressure medication to manage leg swelling.  -discussed that her foot sensation of "walking on rocks" is likely from neuropathy, and she shall discuss this with her PCP   -answered all of patient's and her daughter's questions in detail   MEDICAL HISTORY:  Past Medical History:  Diagnosis Date  . Dyspnea 2021   with exersion   . Early cataracts, bilateral   . Elevated hemoglobin A1c   . Fibroids   . GERD (gastroesophageal reflux disease)   . Hyperlipidemia   . Hypertension   . Lymphoma (HCC)   . Osteopenia     SURGICAL HISTORY: Past Surgical History:  Procedure Laterality Date  . ABDOMINAL HYSTERECTOMY    . APPENDECTOMY    . CATARACT EXTRACTION, BILATERAL Bilateral 09/2017   Dr. Dawna Part  . 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 TONSILLECTOMY AND ADENOIDECTOMY      SOCIAL HISTORY: Social History   Socioeconomic History  . Marital status: Widowed    Spouse name: Not on file  . Number of children: Not on file  . Years of education: Not on file  .  Highest education level: Not on file  Occupational History  . Not on file  Tobacco Use  . Smoking status: Never  . Smokeless tobacco: Never  Vaping Use  . Vaping status: Never Used  Substance and Sexual Activity  . Alcohol use: No  . Drug use: Never  . Sexual activity: Not on file  Other Topics Concern  . Not on file  Social History Narrative  . Not on file  Social Drivers of Corporate investment banker Strain: Not on file  Food Insecurity: No Food Insecurity (06/15/2023)   Hunger Vital Sign   . Worried About Programme researcher, broadcasting/film/video in the Last Year: Never true   . Ran Out of Food in the Last Year: Never true  Transportation Needs: No Transportation Needs (06/15/2023)   PRAPARE - Transportation   . Lack of Transportation (Medical): No   . Lack of Transportation (Non-Medical): No  Physical Activity: Not on file  Stress: Not on file  Social Connections: Patient Unable To Answer (06/15/2023)   Social Connection and Isolation Panel [NHANES]   . Frequency of Communication with Friends and Family: Patient unable to answer   . Frequency of Social Gatherings with Friends and Family: Patient unable to answer   . Attends Religious Services: Patient unable to answer   . Active Member of Clubs or Organizations: Patient unable to answer   . Attends Banker Meetings: Patient unable to answer   . Marital Status: Patient unable to answer  Intimate Partner Violence: Not At Risk (06/15/2023)   Humiliation, Afraid, Rape, and Kick questionnaire   . Fear of Current or Ex-Partner: No   . Emotionally Abused: No   . Physically Abused: No   . Sexually Abused: No    FAMILY HISTORY: Family History  Problem Relation Age of Onset  . Leukemia Mother   . Hypertension Father   . Diabetes Sister   . Heart disease Brother   . Diabetes Brother   . Diabetes Brother   . Hypertension Son     ALLERGIES:  is allergic to nexium [esomeprazole magnesium] and peanut butter flavoring agent  (non-screening).  MEDICATIONS:  Current Outpatient Medications  Medication Sig Dispense Refill  . amLODipine (NORVASC) 5 MG tablet Take 1 tablet (5 mg total) by mouth daily. 30 tablet 3  . B Complex Vitamins (VITAMIN B-COMPLEX PO) Take 1 tablet by mouth daily.    . Ferrous Sulfate (IRON) 325 (65 Fe) MG TABS Take 325 mg by mouth daily.     . Loratadine (CLARITIN PO) Take by mouth daily.    . nadolol (CORGARD) 20 MG tablet TAKE 1 TABLET BY MOUTH DAILY FOR BLOOD PRESSURE OR TREMORS 90 tablet 3  . ondansetron (ZOFRAN) 4 MG tablet Take 1 tablet (4 mg total) by mouth every 8 (eight) hours as needed for nausea or vomiting. 20 tablet 0  . polyethylene glycol (MIRALAX / GLYCOLAX) 17 g packet Take 17 g by mouth daily as needed for mild constipation or moderate constipation. Also available OTC 30 each 0  . potassium chloride SA (KLOR-CON M) 20 MEQ tablet TAKE 1 TABLET BY MOUTH EVERY DAY AS NEEDED 90 tablet 0  . senna-docusate (SENOKOT-S) 8.6-50 MG tablet Take 2 tablets by mouth at bedtime. For constipation 60 tablet 1  . torsemide (DEMADEX) 20 MG tablet TAKE 1 TABLET BY MOUTH EVERY DAY AS NEEDED FOR LEG EDEMA OR ABRUPT WEIGHT GAIN 90 tablet 0  . traMADol (ULTRAM) 50 MG tablet Take 1 tablet (50 mg total) by mouth every 8 (eight) hours as needed for moderate pain (pain score 4-6) or severe pain (pain score 7-10) (pain). 20 tablet 0   No current facility-administered medications for this visit.    REVIEW OF SYSTEMS:    10 Point review of Systems was done is negative except as noted above.   PHYSICAL EXAMINATION: ECOG PERFORMANCE STATUS: 2 - Symptomatic, <50% confined to bed  . There were no vitals  filed for this visit.   There were no vitals filed for this visit.  .There is no height or weight on file to calculate BMI.   GENERAL:alert, in no acute distress and comfortable SKIN: no acute rashes, no significant lesions EYES: conjunctiva are pink and non-injected, sclera anicteric OROPHARYNX:  MMM, no exudates, no oropharyngeal erythema or ulceration NECK: supple, no JVD LYMPH:  no palpable lymphadenopathy in the cervical, axillary or inguinal regions LUNGS: clear to auscultation b/l with normal respiratory effort HEART: regular rate & rhythm ABDOMEN:  normoactive bowel sounds , non tender, not distended. Extremity: no pedal edema PSYCH: alert & oriented x 3 with fluent speech NEURO: no focal motor/sensory deficits   LABORATORY DATA:  I have reviewed the data as listed  .    Latest Ref Rng & Units 06/28/2023   11:48 AM 06/18/2023    7:15 AM 06/17/2023    6:32 AM  CBC  WBC 3.8 - 10.8 Thousand/uL 6.1  8.7  11.4   Hemoglobin 11.7 - 15.5 g/dL 16.1  09.6  04.5   Hematocrit 35.0 - 45.0 % 34.1  32.0  32.8   Platelets 140 - 400 Thousand/uL 194  130  138    .    Latest Ref Rng & Units 06/28/2023   11:48 AM 06/18/2023    7:15 AM 06/17/2023    6:32 AM  CMP  Glucose 65 - 139 mg/dL 95  83  93   BUN 7 - 25 mg/dL 20  25  22    Creatinine 0.60 - 0.95 mg/dL 4.09  8.11  9.14   Sodium 135 - 146 mmol/L 143  141  142   Potassium 3.5 - 5.3 mmol/L 4.0  3.8  3.9   Chloride 98 - 110 mmol/L 106  107  111   CO2 20 - 32 mmol/L 25  24  23    Calcium 8.6 - 10.4 mg/dL 78.2  95.6  21.3    .No results found for: "LDH"   PATHOLOGY Surgical Pathology CASE: [id number] PATIENT: Briellah Woodbury Bone Marrow Report     Clinical History: lymphocytosis, possible lymphoma     DIAGNOSIS:  BONE MARROW, ASPIRATE, CLOT, CORE: -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  The latter is primarily seen by flow cytometry.  The findings favor mantle cell lymphoma but correlation with  cytogenetic and FISH studies is recommended.  MICROSCOPIC DESCRIPTION:  PERIPHERAL BLOOD SMEAR: The red blood cells display prominent anisocytosis with macrocytic and normocytic cells.  There is mild to moderate poikilocytosis with teardrop cells, elliptocytes.  There is moderate polychromasia.  The white blood cells are increased in number with lymphocytosis characterized by predominance of small to medium sized lymphoid cells displaying high nuclear cytoplasmic ratio, round to irregular nuclei, coarse chromatin and small to inconspicuous nucleoli. The platelets are normal in number.     RADIOGRAPHIC STUDIES: I have personally reviewed the radiological images as listed and agreed with the findings in the report. DG Abd Portable 1V Result Date: 06/17/2023 CLINICAL DATA:  Ileus. EXAM: PORTABLE ABDOMEN - 1 VIEW COMPARISON:  CT, 06/14/2023. FINDINGS: Normal bowel gas pattern. Specifically, no evidence of obstruction or significant adynamic ileus. Rounded calcification projects to the left of the L1-L2 disc interspace reflecting the collecting system stone noted on the  CT from 06/14/2023. Skeletal structures are unremarkable. IMPRESSION: 1. No acute findings. No evidence of bowel obstruction or significant adynamic ileus. 2. Left upper quadrant calcification consistent with a left renal pelvis stone. Electronically Signed   By: Amie Portland M.D.   On: 06/17/2023 09:25   CT Head Wo Contrast Result Date: 06/14/2023 CLINICAL DATA:  Neck trauma (Age >= 65y); Head trauma, minor (Age >= 65y) fall. EXAM: CT HEAD WITHOUT CONTRAST CT CERVICAL SPINE WITHOUT CONTRAST TECHNIQUE: Multidetector CT imaging of the head and cervical spine was performed following the standard protocol without intravenous contrast. Multiplanar CT image reconstructions of the cervical spine were also generated. RADIATION DOSE REDUCTION: This exam was performed according to the departmental dose-optimization program which includes  automated exposure control, adjustment of the mA and/or kV according to patient size and/or use of iterative reconstruction technique. COMPARISON:  CT head 11/11/2013 FINDINGS: CT HEAD FINDINGS Brain: No evidence of large-territorial acute infarction. No parenchymal hemorrhage. No mass lesion. No extra-axial collection. No mass effect or midline shift. No hydrocephalus. Basilar cisterns are patent. Vascular: No hyperdense vessel. Skull: No acute fracture or focal lesion. Sinuses/Orbits: Paranasal sinuses and mastoid air cells are clear. The orbits are unremarkable. Other: None. CT CERVICAL SPINE FINDINGS Alignment: Normal. Skull base and vertebrae: Multilevel severe degenerative changes of the spine with posterior disc osteophyte complex formation and posterior longitudinal ligament calcification at the C4-C5 level. No severe osseous neural foraminal or central canal stenosis. No acute fracture. No aggressive appearing focal osseous lesion or focal pathologic process. Soft tissues and spinal canal: No prevertebral fluid or swelling. No visible canal hematoma. Upper chest: Unremarkable. Other: None. IMPRESSION: 1. No acute intracranial abnormality. 2. No acute displaced fracture or traumatic listhesis of the cervical spine. Electronically Signed   By: Tish Frederickson M.D.   On: 06/14/2023 23:18   CT Cervical Spine Wo Contrast Result Date: 06/14/2023 CLINICAL DATA:  Neck trauma (Age >= 65y); Head trauma, minor (Age >= 65y) fall. EXAM: CT HEAD WITHOUT CONTRAST CT CERVICAL SPINE WITHOUT CONTRAST TECHNIQUE: Multidetector CT imaging of the head and cervical spine was performed following the standard protocol without intravenous contrast. Multiplanar CT image reconstructions of the cervical spine were also generated. RADIATION DOSE REDUCTION: This exam was performed according to the departmental dose-optimization program which includes automated exposure control, adjustment of the mA and/or kV according to patient size  and/or use of iterative reconstruction technique. COMPARISON:  CT head 11/11/2013 FINDINGS: CT HEAD FINDINGS Brain: No evidence of large-territorial acute infarction. No parenchymal hemorrhage. No mass lesion. No extra-axial collection. No mass effect or midline shift. No hydrocephalus. Basilar cisterns are patent. Vascular: No hyperdense vessel. Skull: No acute fracture or focal lesion. Sinuses/Orbits: Paranasal sinuses and mastoid air cells are clear. The orbits are unremarkable. Other: None. CT CERVICAL SPINE FINDINGS Alignment: Normal. Skull base and vertebrae: Multilevel severe degenerative changes of the spine with posterior disc osteophyte complex formation and posterior longitudinal ligament calcification at the C4-C5 level. No severe osseous neural foraminal or central canal stenosis. No acute fracture. No aggressive appearing focal osseous lesion or focal pathologic process. Soft tissues and spinal canal: No prevertebral fluid or swelling. No visible canal hematoma. Upper chest: Unremarkable. Other: None. IMPRESSION: 1. No acute intracranial abnormality. 2. No acute displaced fracture or traumatic listhesis of the cervical spine. Electronically Signed   By: Tish Frederickson M.D.   On: 06/14/2023 23:18   CT ABDOMEN PELVIS W CONTRAST Result Date: 06/14/2023 CLINICAL DATA:  Abdominal trauma, blunt The pt  has had 3 falls since yesterday and she reports no injury to her head she is also c/o dizziness sl nausea she is also c/o chest pain EXAM: CT ABDOMEN AND PELVIS WITH CONTRAST TECHNIQUE: Multidetector CT imaging of the abdomen and pelvis was performed using the standard protocol following bolus administration of intravenous contrast. RADIATION DOSE REDUCTION: This exam was performed according to the departmental dose-optimization program which includes automated exposure control, adjustment of the mA and/or kV according to patient size and/or use of iterative reconstruction technique. CONTRAST:  60mL  OMNIPAQUE IOHEXOL 350 MG/ML SOLN COMPARISON:  None Available. FINDINGS: Lower chest: No acute abnormality.  Small hiatal hernia. Hepatobiliary: Not enlarged. No focal lesion. No laceration or subcapsular hematoma. The gallbladder is otherwise unremarkable with no radio-opaque gallstones. No biliary ductal dilatation. Pancreas: Normal pancreatic contour. No main pancreatic duct dilatation. Spleen: Not enlarged. No focal lesion. No laceration, subcapsular hematoma, or vascular injury. Adrenals/Urinary Tract: No nodularity bilaterally. The left kidney enhances homogeneously. Shattered right superior renal pole with associated 12 x 8 x 16 cm perinephric hematoma. Nonenhancing right superior renal pole. Concern for main renal artery avulsion. No definite extravasation of excreted intravenous contrast on delayed imaging from the collecting system. No definite findings of lumen on delayed imaging to suggest active hemorrhage. No hydroureter. The urinary bladder is unremarkable. Stomach/Bowel: No small or large bowel wall thickening or dilatation. The appendix is not definitely identified with no inflammatory changes in the right lower quadrant to suggest acute appendicitis. Vasculature/Lymphatics: No abdominal aorta or iliac aneurysm. No active contrast extravasation or pseudoaneurysm. No abdominal, pelvic, inguinal lymphadenopathy. Reproductive: Normal. Other: No simple free fluid ascites. No pneumoperitoneum. No organized fluid collection. Musculoskeletal: No significant soft tissue hematoma. No acute pelvic fracture. No spinal fracture. Other ports and devices: None. IMPRESSION: 1. 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 2. No acute fracture or traumatic malalignment of the lumbar spine. These results were called by telephone at the time of interpretation on 06/14/2023 at 11:10 pm to provider Dr. Fredderick Phenix, who verbally acknowledged these results. Electronically Signed   By: Tish Frederickson M.D.    On: 06/14/2023 23:13   DG Hip Unilat W or Wo Pelvis 2-3 Views Left Result Date: 06/14/2023 CLINICAL DATA:  Pain, recurrent falls. EXAM: DG HIP (WITH OR WITHOUT PELVIS) 2-3V LEFT COMPARISON:  None Available. FINDINGS: No fracture of the pelvis or left hip. No left hip dislocation. Intact pubic rami. Minor acetabular spurring. Pubic symphysis and sacroiliac joints are congruent. IMPRESSION: 1. No fracture or dislocation of the pelvis or left hip. 2. Minor degenerative acetabular spurring. Electronically Signed   By: Narda Rutherford M.D.   On: 06/14/2023 20:10   DG Knee 2 Views Right Result Date: 06/14/2023 CLINICAL DATA:  Pain.  Recurrent falls. EXAM: RIGHT KNEE - 1-2 VIEW COMPARISON:  None Available. FINDINGS: No evidence of fracture, dislocation, or joint effusion. Mild medial tibiofemoral joint space narrowing. Mild patellofemoral spurring. Moderate quadriceps and patellar tendon enthesophytes. IMPRESSION: 1. No fracture or subluxation of the right knee. 2. Mild osteoarthritis. Electronically Signed   By: Narda Rutherford M.D.   On: 06/14/2023 20:09   DG Knee 2 Views Left Result Date: 06/14/2023 CLINICAL DATA:  Pain.  Recurrent falls. EXAM: LEFT KNEE - 1-2 VIEW COMPARISON:  None Available. FINDINGS: No evidence of fracture, dislocation, or joint effusion. Mild medial tibiofemoral joint space narrowing. Mild patellofemoral spurring. Quadriceps and patellar tendon enthesophytes. IMPRESSION: Mild osteoarthritis. No acute fracture or subluxation.  Electronically Signed   By: Narda Rutherford M.D.   On: 06/14/2023 20:08   DG Chest 2 View Result Date: 06/14/2023 CLINICAL DATA:  Recurrent falls. EXAM: CHEST - 2 VIEW COMPARISON:  06/02/2023 FINDINGS: The cardiomediastinal contours are normal. Subpleural changes on prior exam are not as well demonstrated. Pulmonary vasculature is normal. No consolidation, pleural effusion, or pneumothorax. No acute osseous abnormalities are seen. IMPRESSION: No acute findings.  Electronically Signed   By: Narda Rutherford M.D.   On: 06/14/2023 20:07    ASSESSMENT & PLAN:   83 y.o. woman with:   1.  CD20+ CD5+ve lymphoproliferative disorder presented with hemolytic anemia and splenomegaly in December 2021. Though discussed with patient that FISH was neg and so Mantle cell lymphoma not confirm. Findings more consistent with CLL.  2.  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  PLAN: -Discussed lab results from today, 03/14/2023, in detail with the patient. CBC shows slightly low platelets of 142 K. CMP shows elevated BUN of 33, elevated creatinine of 1.43, and elevated calcium level of 10.6.  -no major signs of red cell breaskage or anemia at this time -continue to hold off ibrutinitb and continue to monitor -no signs of infection at this time -no evidence of CLL/SLL progression at this time. -will continue to monitor with labs in 2-3 months -Continue to follow-up with Pulmonologist and Dermatologist.   FOLLOW-UP: ***  The total time spent in the appointment was *** minutes* .  All of the patient's questions were answered with apparent satisfaction. The patient knows to call the clinic with any problems, questions or concerns.   Wyvonnia Lora MD MS AAHIVMS [facility] Hematology/Oncology Physician [facility]  .*Total Encounter Time as defined by the Centers for Medicare and Medicaid Services includes, in addition to the face-to-face time of a patient visit (documented in the note above) non-face-to-face time: obtaining and reviewing outside history, ordering and reviewing medications, tests or procedures, care coordination (communications with other health care professionals or caregivers) and documentation in the medical record.   I,Mitra Faeizi,acting as a Neurosurgeon for Wyvonnia Lora, MD.,have documented all relevant documentation on the behalf of Wyvonnia Lora, MD,as directed by  Wyvonnia Lora, MD  while in the presence of Wyvonnia Lora, MD.  ***

## 2023-07-13 NOTE — Telephone Encounter (Signed)
 Spoke with patient confirming upcoming appointment

## 2023-07-17 ENCOUNTER — Encounter (HOSPITAL_COMMUNITY)
Admission: RE | Admit: 2023-07-17 | Discharge: 2023-07-17 | Disposition: A | Discharge status: Home / Self Care | Source: Ambulatory Visit | Attending: Hematology | Admitting: Hematology

## 2023-07-17 ENCOUNTER — Inpatient Hospital Stay

## 2023-07-17 ENCOUNTER — Other Ambulatory Visit: Payer: Self-pay

## 2023-07-17 DIAGNOSIS — C9110 Chronic lymphocytic leukemia of B-cell type not having achieved remission: Secondary | ICD-10-CM | POA: Insufficient documentation

## 2023-07-17 LAB — CMP (CANCER CENTER ONLY)
ALT: 18 U/L (ref 0–44)
AST: 27 U/L (ref 15–41)
Albumin: 4.1 g/dL (ref 3.5–5.0)
Alkaline Phosphatase: 72 U/L (ref 38–126)
Anion gap: 9 (ref 5–15)
BUN: 26 mg/dL — ABNORMAL HIGH (ref 8–23)
CO2: 24 mmol/L (ref 22–32)
Calcium: 10.5 mg/dL — ABNORMAL HIGH (ref 8.9–10.3)
Chloride: 105 mmol/L (ref 98–111)
Creatinine: 1.58 mg/dL — ABNORMAL HIGH (ref 0.44–1.00)
GFR, Estimated: 32 mL/min — ABNORMAL LOW (ref >60)
Glucose, Bld: 97 mg/dL (ref 70–99)
Potassium: 4.5 mmol/L (ref 3.5–5.1)
Sodium: 138 mmol/L (ref 135–145)
Total Bilirubin: 1.2 mg/dL (ref 0.0–1.2)
Total Protein: 6.8 g/dL (ref 6.5–8.1)

## 2023-07-17 LAB — CBC WITH DIFFERENTIAL (CANCER CENTER ONLY)
Abs Immature Granulocytes: 0.02 10*3/uL (ref 0.00–0.07)
Basophils Absolute: 0.1 10*3/uL (ref 0.0–0.1)
Basophils Relative: 1 %
Eosinophils Absolute: 0.2 10*3/uL (ref 0.0–0.5)
Eosinophils Relative: 2 %
HCT: 32.9 % — ABNORMAL LOW (ref 36.0–46.0)
Hemoglobin: 10.8 g/dL — ABNORMAL LOW (ref 12.0–15.0)
Immature Granulocytes: 0 %
Lymphocytes Relative: 18 %
Lymphs Abs: 1.4 10*3/uL (ref 0.7–4.0)
MCH: 29 pg (ref 26.0–34.0)
MCHC: 32.8 g/dL (ref 30.0–36.0)
MCV: 88.2 fL (ref 80.0–100.0)
Monocytes Absolute: 1 10*3/uL (ref 0.1–1.0)
Monocytes Relative: 13 %
Neutro Abs: 5.1 10*3/uL (ref 1.7–7.7)
Neutrophils Relative %: 66 %
Platelet Count: 226 10*3/uL (ref 150–400)
RBC: 3.73 MIL/uL — ABNORMAL LOW (ref 3.87–5.11)
RDW: 12 % (ref 11.5–15.5)
WBC Count: 7.7 10*3/uL (ref 4.0–10.5)
nRBC: 0 % (ref 0.0–0.2)

## 2023-07-17 LAB — GLUCOSE, CAPILLARY: Glucose-Capillary: 103 mg/dL — ABNORMAL HIGH (ref 70–99)

## 2023-07-17 MED ORDER — FLUDEOXYGLUCOSE F - 18 (FDG) INJECTION
7.4100 | Freq: Once | INTRAVENOUS | Status: AC | PRN
  Administered 2023-07-17: 7.41 via INTRAVENOUS

## 2023-07-18 ENCOUNTER — Ambulatory Visit (INDEPENDENT_AMBULATORY_CARE_PROVIDER_SITE_OTHER): Admitting: Family

## 2023-07-18 ENCOUNTER — Encounter: Payer: Self-pay | Admitting: Family

## 2023-07-18 VITALS — BP 134/80 | HR 71 | Temp 97.6°F | Resp 19 | Ht 59.0 in | Wt 149.6 lb

## 2023-07-18 DIAGNOSIS — C9110 Chronic lymphocytic leukemia of B-cell type not having achieved remission: Secondary | ICD-10-CM | POA: Diagnosis not present

## 2023-07-18 DIAGNOSIS — R1310 Dysphagia, unspecified: Secondary | ICD-10-CM

## 2023-07-18 DIAGNOSIS — R600 Localized edema: Secondary | ICD-10-CM

## 2023-07-18 DIAGNOSIS — N183 Chronic kidney disease, stage 3 unspecified: Secondary | ICD-10-CM | POA: Diagnosis not present

## 2023-07-18 MED ORDER — POTASSIUM CHLORIDE ER 10 MEQ PO CPCR: 20.0000 meq | ORAL_CAPSULE | Freq: Every day | ORAL | 0 refills | Status: DC

## 2023-07-18 NOTE — Progress Notes (Signed)
 Provider: Richarda Blade FNP-C  Shakiyla Kook, Donalee Citrin, NP  Patient Care Team: Chrishelle Zito, Donalee Citrin, NP as PCP - General (Family Medicine) Flo Shanks, MD as Consulting Physician (Otolaryngology) Sharrell Ku, MD as Consulting Physician (Gastroenterology) Pennie Rushing, Maris Berger, MD (Inactive) as Consulting Physician (Obstetrics and Gynecology) Jethro Bolus, MD as Consulting Physician (Ophthalmology) Charlett Nose, [facility] (Inactive) as Pharmacist (Pharmacist)  Extended Emergency Contact Information Primary Emergency Contact: Georga Kaufmann Address: [address]          [place], [place] [address] [place] of [place] Home Phone: [phone] Mobile Phone: [phone] Relation: Son Secondary Emergency Contact: Detamore,Keith Mobile Phone: [phone] Relation: Son Preferred language: English Interpreter needed? No  Code Status:  Full Code  Goals of care: Advanced Directive information    07/18/2023    9:04 AM  Advanced Directives  Does Patient Have a Medical Advance Directive? Yes  Type of Estate agent of [place];Living will  Does patient want to make changes to medical advance directive? No - Patient declined  Copy of Healthcare Power of Attorney in Chart? No - copy requested     Chief Complaint  Patient presents with   Transitions [facility] follow up.    Discussed the use of AI scribe software for clinical note transcription with the patient, who gave verbal consent to proceed.  History of Present Illness   Jacqueline Hancock is an 83 year old female with chronic lymphocytic leukemia who presents for a hospital follow-up after recent imaging studies.  She recently underwent a CT scan of the chest and a PET scan from the skull to the thigh and is currently awaiting the results. She feels better today compared to previous days.  She has a history of difficulty swallowing large pills, which tend to get stuck in her throat. She is scheduled for a test  to evaluate this issue. She can take liquid medications and is considering mixing them with juice to improve taste.  She is currently taking Desonide 20 mg once daily and has been prescribed potassium supplements due to the use of a diuretic, which she did not take recently due to travel. She has difficulty swallowing large potassium pills and is considering using capsules that can be mixed with applesauce.  Her weight is 149.6 pounds, down from 160 pounds, which she attributes to the diuretic. She experiences shortness of breath only after walking long distances, such as in the hospital. When she retains fluid, she experiences coughing and shortness of breath.  She experiences night sweats despite keeping the house cold. No issues with depression, anxiety, or bowel movements. She has good sleep quality.  She avoids anti-inflammatory medications like Motrin or Aleve due to kidney concerns and uses Tylenol for pain management. Her calcium levels have normalized after previously being high, and she has stopped taking calcium supplements.   Past Medical History:  Diagnosis Date   Dyspnea 2021   with exersion    Early cataracts, bilateral    Elevated hemoglobin A1c    Fibroids    GERD (gastroesophageal reflux disease)    Hyperlipidemia    Hypertension    Lymphoma (HCC)    Osteopenia    Past Surgical History:  Procedure Laterality Date   ABDOMINAL HYSTERECTOMY     APPENDECTOMY     CATARACT EXTRACTION, BILATERAL Bilateral 09/2017   Dr. 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   TONSILLECTOMY AND ADENOIDECTOMY      Allergies  Allergen  Reactions   Nexium [Esomeprazole Magnesium] Other (See Comments)    Headache   Peanut Butter Flavoring Agent (Non-Screening) Itching    Outpatient Encounter Medications as of 07/18/2023  Medication Sig   amLODipine (NORVASC) 5 MG tablet Take 1 tablet (5 mg total) by mouth daily.   B Complex Vitamins (VITAMIN B-COMPLEX PO) Take 1 tablet  by mouth daily.   Ferrous Sulfate (IRON) 325 (65 Fe) MG TABS Take 325 mg by mouth daily.    Loratadine (CLARITIN PO) Take by mouth daily.   nadolol (CORGARD) 20 MG tablet TAKE 1 TABLET BY MOUTH DAILY FOR BLOOD PRESSURE OR TREMORS   ondansetron (ZOFRAN) 4 MG tablet Take 1 tablet (4 mg total) by mouth every 8 (eight) hours as needed for nausea or vomiting.   polyethylene glycol (MIRALAX / GLYCOLAX) 17 g packet Take 17 g by mouth daily as needed for mild constipation or moderate constipation. Also available OTC   potassium chloride (MICRO-K) 10 MEQ CR capsule Take 2 capsules (20 mEq total) by mouth daily.   senna-docusate (SENOKOT-S) 8.6-50 MG tablet Take 2 tablets by mouth at bedtime. For constipation   torsemide (DEMADEX) 20 MG tablet TAKE 1 TABLET BY MOUTH EVERY DAY AS NEEDED FOR LEG EDEMA OR ABRUPT WEIGHT GAIN   traMADol (ULTRAM) 50 MG tablet Take 1 tablet (50 mg total) by mouth every 8 (eight) hours as needed for moderate pain (pain score 4-6) or severe pain (pain score 7-10) (pain).   [DISCONTINUED] potassium chloride SA (KLOR-CON M) 20 MEQ tablet TAKE 1 TABLET BY MOUTH EVERY DAY AS NEEDED   [DISCONTINUED] gabapentin (NEURONTIN) 100 MG capsule Take 1 capsule 3 x /day for Neuropathy Pain (Patient not taking: Reported on 01/25/2020)   No facility-administered encounter medications on file as of 07/18/2023.    Review of Systems  Constitutional:  Negative for appetite change, chills, fatigue, fever and unexpected weight change.  HENT:  Positive for trouble swallowing. Negative for congestion, dental problem, ear discharge, ear pain, facial swelling, hearing loss, nosebleeds, postnasal drip, rhinorrhea, sinus pressure, sinus pain, sneezing, sore throat and tinnitus.        Has appointment with GI   Eyes:  Negative for pain, discharge, redness, itching and visual disturbance.  Respiratory:  Negative for cough, chest tightness, shortness of breath and wheezing.   Cardiovascular:  Positive for leg  swelling. Negative for chest pain and palpitations.  Gastrointestinal:  Negative for abdominal distention, abdominal pain, blood in stool, constipation, diarrhea, nausea and vomiting.  Genitourinary:  Negative for difficulty urinating, dysuria, flank pain, frequency and urgency.  Musculoskeletal:  Negative for arthralgias, back pain, gait problem, joint swelling, myalgias, neck pain and neck stiffness.  Skin:  Negative for color change, pallor, rash and wound.  Neurological:  Negative for dizziness, syncope, speech difficulty, weakness, light-headedness, numbness and headaches.  Hematological:  Does not bruise/bleed easily.  Psychiatric/Behavioral:  Negative for agitation, behavioral problems, confusion, hallucinations, self-injury, sleep disturbance and suicidal ideas. The patient is not nervous/anxious.     Immunization History  Administered Date(s) Administered   Influenza, High Dose Seasonal PF 01/28/2015, 12/11/2015, 01/25/2017, 01/22/2018, 01/10/2019, 02/03/2019, 02/05/2020, 02/03/2021, 02/03/2022, 12/27/2022   Influenza-Unspecified 01/29/2013, 12/27/2013, 01/25/2017   PFIZER(Purple Top)SARS-COV-2 Vaccination 05/11/2019, 06/03/2019   PPD Test 02/25/2013   Pfizer Covid-19 Vaccine Bivalent Booster 55yrs & up 01/20/2022   Pneumococcal Conjugate-13 01/24/2017   Pneumococcal Polysaccharide-23 02/25/2013   Td 09/13/2001, 02/20/2012   Tdap 02/25/2013   Zoster, Live 10/23/2006   Pertinent  Health Maintenance Due  Topic  Date Due   INFLUENZA VACCINE  11/03/2023   DEXA SCAN  Completed      04/12/2021   11:54 AM 08/05/2021   12:08 AM 08/17/2022   10:08 AM 06/14/2023   10:41 AM 07/18/2023    9:02 AM  Fall Risk  Falls in the past year?  0 0 1 0  Was there an injury with Fall?    0 0  Fall Risk Category Calculator    2 0  (RETIRED) Patient Fall Risk Level Low fall risk      Patient at Risk for Falls Due to  No Fall Risks  Impaired balance/gait;Impaired mobility History of fall(s)  Fall risk  Follow up  Falls evaluation completed;Education provided;Falls prevention discussed  Falls evaluation completed Falls evaluation completed   Functional Status Survey:    Vitals:   07/18/23 0907  BP: 134/80  Pulse: 71  Resp: 19  Temp: 97.6 F (36.4 C)  SpO2: 98%  Weight: 149 lb 9.6 oz (67.9 kg)  Height: 4\' 11"  (1.499 m)   Body mass index is 30.22 kg/m. Physical Exam Physical Exam   VITALS: T- 97.6, P- 71, BP- 134/80, SaO2- 98% MEASUREMENTS: Weight- 149.6. GENERAL: Alert, cooperative, well developed, no acute distress. HEENT: Normocephalic, normal oropharynx, moist mucous membranes. CHEST: Clear to auscultation bilaterally, no wheezes, rhonchi, or crackles. CARDIOVASCULAR: Normal heart rate and rhythm, S1 and S2 normal without murmurs. ABDOMEN: Soft, non-tender, non-distended, without organomegaly, normal bowel sounds. EXTREMITIES: Pitting edema present in lower extremities, no cyanosis. NEUROLOGICAL: Cranial nerves grossly intact, moves all extremities without gross motor or sensory deficit.      Labs reviewed: Recent Labs    02/14/23 1119 03/14/23 1100 06/17/23 0632 06/18/23 0715 06/28/23 1148 07/12/23 0857 07/17/23 0912  NA 139   < > 142 141 143 136 138  K 4.5   < > 3.9 3.8 4.0 4.5 4.5  CL 102   < > 111 107 106 102 105  CO2 26   < > 23 24 25 27 24   GLUCOSE 98   < > 93 83 95 130* 97  BUN 22   < > 22 25* 20 32* 26*  CREATININE 1.19*   < > 1.51* 1.31* 1.27* 1.56* 1.58*  CALCIUM 10.2   < > 10.8* 10.1 10.0 10.8* 10.5*  MG 2.1  --   --   --   --   --   --   PHOS  --   --  3.2 2.5  --   --   --    < > = values in this interval not displayed.   Recent Labs    06/16/23 0739 06/17/23 0632 06/18/23 0715 07/12/23 0857 07/17/23 0912  AST 26  --   --  27 27  ALT 30  --   --  18 18  ALKPHOS 61  --   --  80 72  BILITOT 1.2  --   --  1.1 1.2  PROT 5.6*  --   --  7.3 6.8  ALBUMIN 3.2*   < > 2.9* 4.3 4.1   < > = values in this interval not displayed.   Recent Labs     06/28/23 1148 07/12/23 0857 07/17/23 0912  WBC 6.1 6.7 7.7  NEUTROABS 3,849 4.3 5.1  HGB 11.4* 11.8* 10.8*  HCT 34.1* 35.6* 32.9*  MCV 90.2 88.8 88.2  PLT 194 217 226   Lab Results  Component Value Date   TSH 2.07 06/14/2023   Lab Results  Component Value Date   HGBA1C 5.7 (H) 06/14/2023   Lab Results  Component Value Date   CHOL 140 06/14/2023   HDL 40 (L) 06/14/2023   LDLCALC 77 06/14/2023   TRIG 135 06/14/2023   CHOLHDL 3.5 06/14/2023    Significant Diagnostic Results in last 30 days:  DG Chest 2 View Result Date: 07/12/2023 CLINICAL DATA:  Shortness of breath. EXAM: CHEST - 2 VIEW COMPARISON:  June 02, 2023. FINDINGS: Stable cardiomediastinal silhouette. No acute pulmonary disease is noted. The visualized skeletal structures are unremarkable. IMPRESSION: No active cardiopulmonary disease. Electronically Signed   By: Rosalene Colon M.D.   On: 07/12/2023 12:13    Assessment/Plan     Hypokalemia She is on Desonide, a diuretic, which can cause potassium loss. She was prescribed potassium supplements but has difficulty swallowing large pills. Insurance coverage for liquid potassium was uncertain, so capsules were chosen as a feasible alternative. - Prescribe potassium capsules (10 mEq) to be taken as two capsules mixed with applesauce - Discontinue large potassium pills  Bilateral lower extremity edema  Desonide has been effective for fluid retention, evidenced by weight loss from 160 lbs to 149.6 lbs. She experiences shortness of breath only with prolonged walking, indicating improvement. - Continue Desonide once daily - Monitor weight regularly - Advise on low-salt diet and use of DASH diet - Elevate legs when sitting to reduce swelling  Chronic Kidney Disease Her kidney function has shown slight improvement. BUN decreased from 25 to 20, and creatinine decreased from 1.31 to 1.27, indicating better hydration and kidney clearance. - Avoid NSAIDs such as Motrin  and Aleve - Use Tylenol for pain management  Dysphagia She reports difficulty swallowing large pills, which get stuck in her throat. She is scheduled for a gastroenterology appointment in May to evaluate her swallowing difficulties. - Refer to gastroenterology for evaluation of dysphagia in May  Chronic Lymphocytic Leukemia (CLL) She is under the care of an oncologist, Dr. Tito Formica, for CLL. She reports no issues related to CLL and is following up with the oncologist every two weeks. A PET scan from skull to thigh was recently performed, and results are pending. - Continue follow-up with oncologist Dr. Salomon Cree every two weeks - Await results of recent PET scan  Hypercalcemia She was previously hospitalized for hypercalcemia, which has now resolved. Her calcium levels have normalized to 10.0 mg/dL after discontinuing calcium supplements, which had previously reached 14.9 mg/dL. - Continue to avoid calcium supplements   Family/ staff Communication: Reviewed plan of care with patient verbalized understanding   Labs/tests ordered: None   Next Appointment: Return if symptoms worsen or fail to improve.   Total time: 25 minutes. Greater than 50% of total time spent doing patient education regarding hypokalemia,Hypercalcemia,dysphagia,CLL,edema health maintenance including symptom/medication management.   Estil Heman, NP

## 2023-07-24 ENCOUNTER — Telehealth: Payer: Self-pay | Admitting: *Deleted

## 2023-07-24 NOTE — Telephone Encounter (Signed)
 Copied from CRM [id number]. Topic: Clinical - Home Health Verbal Orders >> Jul 21, 2023  9:31 AM Julie Oddi wrote: Caller/Agency: [facility] Callback Number: [phone] Service Requested: Physical Therapy Frequency: 1x week for 2 weeks Any new concerns about the patient? No - Provider is aware that pt is retaining some fluid, possible heart failure, generalized weakness     Verbal Orders give to [name] with [facility]

## 2023-07-27 ENCOUNTER — Other Ambulatory Visit: Payer: Self-pay | Admitting: Family

## 2023-07-27 DIAGNOSIS — I10 Essential (primary) hypertension: Secondary | ICD-10-CM

## 2023-07-27 DIAGNOSIS — G250 Essential tremor: Secondary | ICD-10-CM

## 2023-07-27 MED ORDER — NADOLOL 20 MG PO TABS: ORAL_TABLET | ORAL | 3 refills | Status: DC

## 2023-07-27 NOTE — Telephone Encounter (Signed)
 Copied from CRM [id number]. Topic: Clinical - Medication Refill >> Jul 27, 2023 10:52 AM Jacqueline Hancock wrote: Most Recent Primary Care Visit:  Provider: Christean Courts C  Department: [facility]  Visit Type: HOSPITAL FOLLOW UP  Date: 07/18/2023  Medication: nadolol  (CORGARD ) 20 MG tablet Losartan  (this medication was not on the list)  Has the patient contacted their pharmacy? Yes (Agent: If no, request that the patient contact the pharmacy for the refill. If patient does not wish to contact the pharmacy document the reason why and proceed with request.) (Agent: If yes, when and what did the pharmacy advise?) No refills left   Is this the correct pharmacy for this prescription? Yes If no, delete pharmacy and type the correct one.  This is the patient's preferred pharmacy:   [facility] [phone] - [place], [place] - [address] AT [facility] [address] [place] [address] Phone: [phone] Fax: [phone]   Has the prescription been filled recently? No  Is the patient out of the medication? No  Has the patient been seen for an appointment in the last year OR does the patient have an upcoming appointment? Yes  Can we respond through MyChart? No  Agent: Please be advised that Rx refills may take up to 3 business days. We ask that you follow-up with your pharmacy.

## 2023-07-30 ENCOUNTER — Ambulatory Visit (INDEPENDENT_AMBULATORY_CARE_PROVIDER_SITE_OTHER)

## 2023-07-30 ENCOUNTER — Encounter (HOSPITAL_COMMUNITY): Payer: Self-pay | Admitting: Emergency Medicine

## 2023-07-30 ENCOUNTER — Ambulatory Visit (INDEPENDENT_AMBULATORY_CARE_PROVIDER_SITE_OTHER)
Admission: EM | Admit: 2023-07-30 | Discharge: 2023-07-30 | Disposition: A | Discharge status: Home / Self Care | Source: Home / Self Care

## 2023-07-30 ENCOUNTER — Other Ambulatory Visit: Payer: Self-pay

## 2023-07-30 DIAGNOSIS — R0602 Shortness of breath: Secondary | ICD-10-CM | POA: Insufficient documentation

## 2023-07-30 DIAGNOSIS — C9110 Chronic lymphocytic leukemia of B-cell type not having achieved remission: Secondary | ICD-10-CM | POA: Diagnosis not present

## 2023-07-30 DIAGNOSIS — R5383 Other fatigue: Secondary | ICD-10-CM

## 2023-07-30 DIAGNOSIS — N179 Acute kidney failure, unspecified: Secondary | ICD-10-CM | POA: Diagnosis not present

## 2023-07-30 LAB — COMPREHENSIVE METABOLIC PANEL WITH GFR
ALT: 37 U/L (ref 0–44)
AST: 45 U/L — ABNORMAL HIGH (ref 15–41)
Albumin: 3.4 g/dL — ABNORMAL LOW (ref 3.5–5.0)
Alkaline Phosphatase: 56 U/L (ref 38–126)
Anion gap: 15 (ref 5–15)
BUN: 58 mg/dL — ABNORMAL HIGH (ref 8–23)
CO2: 21 mmol/L — ABNORMAL LOW (ref 22–32)
Calcium: 11.6 mg/dL — ABNORMAL HIGH (ref 8.9–10.3)
Chloride: 98 mmol/L (ref 98–111)
Creatinine, Ser: 2.42 mg/dL — ABNORMAL HIGH (ref 0.44–1.00)
GFR, Estimated: 19 mL/min — ABNORMAL LOW (ref >60)
Glucose, Bld: 100 mg/dL — ABNORMAL HIGH (ref 70–99)
Potassium: 5.6 mmol/L — ABNORMAL HIGH (ref 3.5–5.1)
Sodium: 134 mmol/L — ABNORMAL LOW (ref 135–145)
Total Bilirubin: 1.1 mg/dL (ref 0.0–1.2)
Total Protein: 6.8 g/dL (ref 6.5–8.1)

## 2023-07-30 LAB — CBC WITH DIFFERENTIAL/PLATELET
Abs Immature Granulocytes: 0.07 10*3/uL (ref 0.00–0.07)
Basophils Absolute: 0.1 10*3/uL (ref 0.0–0.1)
Basophils Relative: 1 %
Eosinophils Absolute: 0.2 10*3/uL (ref 0.0–0.5)
Eosinophils Relative: 1 %
HCT: 36.6 % (ref 36.0–46.0)
Hemoglobin: 12 g/dL (ref 12.0–15.0)
Immature Granulocytes: 1 %
Lymphocytes Relative: 17 %
Lymphs Abs: 2.1 10*3/uL (ref 0.7–4.0)
MCH: 29 pg (ref 26.0–34.0)
MCHC: 32.8 g/dL (ref 30.0–36.0)
MCV: 88.4 fL (ref 80.0–100.0)
Monocytes Absolute: 1.4 10*3/uL — ABNORMAL HIGH (ref 0.1–1.0)
Monocytes Relative: 11 %
Neutro Abs: 8.6 10*3/uL — ABNORMAL HIGH (ref 1.7–7.7)
Neutrophils Relative %: 69 %
Platelets: 324 10*3/uL (ref 150–400)
RBC: 4.14 MIL/uL (ref 3.87–5.11)
RDW: 12.5 % (ref 11.5–15.5)
WBC: 12.3 10*3/uL — ABNORMAL HIGH (ref 4.0–10.5)
nRBC: 0 % (ref 0.0–0.2)

## 2023-07-30 LAB — POC SARS CORONAVIRUS 2 AG -  ED: SARS Coronavirus 2 Ag: NEGATIVE

## 2023-07-30 NOTE — ED Triage Notes (Signed)
 Pt reports feeling more fatigue and SOB x 3 days.

## 2023-07-30 NOTE — Discharge Instructions (Addendum)
  1. Shortness of breath (Primary) - DG Chest 2 View x-ray performed in UC shows no acute cardiopulmonary processes,, no sign of consolidation or pneumonia. - ED EKG performed in UC shows normal sinus rhythm with a ventricular rate of 76 bpm, normal EKG  2. Fatigue, unspecified type - CBC with Differential - Comprehensive metabolic panel - POC SARS Coronavirus 2 Ag-ED - Nasal Swab -Specimens collected and sent to lab for further testing results should be available in 1 to 2 days. -Recommend follow-up with oncologist for new onset of fatigue and shortness of breath if symptoms persist -Continue to monitor symptoms for any change in severity if there is any escalation of current symptoms or development of new symptoms follow-up in ER for further evaluation and management.

## 2023-07-30 NOTE — ED Provider Notes (Signed)
 [facility]  Note:  This document was prepared using Dragon voice recognition software and may include unintentional dictation errors.  MRN: [id number] DOB: [birth year]  Subjective:   Jacqueline Hancock is a 83 y.o. female presenting for fatigue and increased shortness of breath x 3 days.  Patient denies any congestion, cough, body aches, sore throat.  Patient states that she has intermittent feeling of chest discomfort.  No current wheezing but states that she feels like she is wheezing at home.  Patient has past history of interstitial lung disease and states that she was recently admitted in the hospital for hypercalcemia.  No current facility-administered medications for this encounter.  Current Outpatient Medications:    amLODipine  (NORVASC ) 5 MG tablet, Take 1 tablet (5 mg total) by mouth daily., Disp: 30 tablet, Rfl: 3   B Complex Vitamins (VITAMIN B-COMPLEX PO), Take 1 tablet by mouth daily., Disp: , Rfl:    Ferrous Sulfate  (IRON) 325 (65 Fe) MG TABS, Take 325 mg by mouth daily. , Disp: , Rfl:    Loratadine (CLARITIN PO), Take by mouth daily., Disp: , Rfl:    nadolol  (CORGARD ) 20 MG tablet, TAKE 1 TABLET BY MOUTH DAILY FOR BLOOD PRESSURE OR TREMORS, Disp: 90 tablet, Rfl: 3   ondansetron  (ZOFRAN ) 4 MG tablet, Take 1 tablet (4 mg total) by mouth every 8 (eight) hours as needed for nausea or vomiting., Disp: 20 tablet, Rfl: 0   polyethylene glycol (MIRALAX  / GLYCOLAX ) 17 g packet, Take 17 g by mouth daily as needed for mild constipation or moderate constipation. Also available OTC, Disp: 30 each, Rfl: 0   potassium chloride  (MICRO-K ) 10 MEQ CR capsule, Take 2 capsules (20 mEq total) by mouth daily., Disp: 180 capsule, Rfl: 0   senna-docusate (SENOKOT-S) 8.6-50 MG tablet, Take 2 tablets by mouth at bedtime. For constipation, Disp: 60 tablet, Rfl: 1   torsemide  (DEMADEX ) 20 MG tablet, TAKE 1 TABLET BY MOUTH EVERY DAY AS NEEDED FOR LEG EDEMA OR ABRUPT WEIGHT GAIN, Disp: 90 tablet, Rfl:  0   traMADol  (ULTRAM ) 50 MG tablet, Take 1 tablet (50 mg total) by mouth every 8 (eight) hours as needed for moderate pain (pain score 4-6) or severe pain (pain score 7-10) (pain)., Disp: 20 tablet, Rfl: 0   Allergies  Allergen Reactions   Nexium [Esomeprazole Magnesium] Other (See Comments)    Headache   Peanut Butter Flavoring Agent (Non-Screening) Itching    Past Medical History:  Diagnosis Date   Dyspnea 2021   with exersion    Early cataracts, bilateral    Elevated hemoglobin A1c    Fibroids    GERD (gastroesophageal reflux disease)    Hyperlipidemia    Hypertension    Lymphoma (HCC)    Osteopenia      Past Surgical History:  Procedure Laterality Date   ABDOMINAL HYSTERECTOMY     APPENDECTOMY     CATARACT EXTRACTION, BILATERAL Bilateral 09/2017   Dr. 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   TONSILLECTOMY AND ADENOIDECTOMY      Family History  Problem Relation Age of Onset   Leukemia Mother    Hypertension Father    Diabetes Sister    Heart disease Brother    Diabetes Brother    Diabetes Brother    Hypertension Son     Social History   Tobacco Use   Smoking status: Never   Smokeless tobacco: Never  Vaping Use   Vaping status: Never Used  Substance Use Topics   Alcohol use: No   Drug use: Never    ROS Refer to HPI for ROS details.  Objective:   Vitals: BP 123/66 (BP Location: Right Arm)   Pulse 74   Temp 98.6 F (37 C) (Oral)   Resp 18   SpO2 98%   Physical Exam Vitals and nursing note reviewed.  Constitutional:      General: She is not in acute distress.    Appearance: She is well-developed. She is not ill-appearing or toxic-appearing.  HENT:     Head: Normocephalic and atraumatic.     Nose: Nose normal.     Mouth/Throat:     Mouth: Mucous membranes are moist.  Cardiovascular:     Rate and Rhythm: Normal rate and regular rhythm.     Heart sounds: Normal heart sounds. No murmur heard. Pulmonary:     Effort:  Pulmonary effort is normal. No respiratory distress.     Breath sounds: No stridor. No wheezing, rhonchi or rales.  Chest:     Chest wall: No tenderness.  Skin:    General: Skin is warm and dry.  Neurological:     General: No focal deficit present.     Mental Status: She is alert and oriented to person, place, and time.  Psychiatric:        Mood and Affect: Mood normal.        Behavior: Behavior normal.     Procedures  No results found for this or any previous visit (from the past 24 hours).  No results found.   Assessment and Plan :     Discharge Instructions       1. Shortness of breath (Primary) - DG Chest 2 View x-ray performed in UC shows no acute cardiopulmonary processes,, no sign of consolidation or pneumonia. - ED EKG performed in UC shows normal sinus rhythm with a ventricular rate of 76 bpm, normal EKG  2. Fatigue, unspecified type - CBC with Differential - Comprehensive metabolic panel - POC SARS Coronavirus 2 Ag-ED - Nasal Swab -Specimens collected and sent to lab for further testing results should be available in 1 to 2 days. -Recommend follow-up with oncologist for new onset of fatigue and shortness of breath if symptoms persist -Continue to monitor symptoms for any change in severity if there is any escalation of current symptoms or development of new symptoms follow-up in ER for further evaluation and management.      Jacqueline Hancock B Jacqueline Hancock   Jacqueline Hancock, [place], [place] 07/30/23 1810

## 2023-08-01 ENCOUNTER — Emergency Department (HOSPITAL_COMMUNITY)

## 2023-08-01 ENCOUNTER — Encounter (HOSPITAL_COMMUNITY): Payer: Self-pay | Admitting: Emergency Medicine

## 2023-08-01 ENCOUNTER — Inpatient Hospital Stay (HOSPITAL_COMMUNITY)
Admission: EM | Admit: 2023-08-01 | Discharge: 2023-08-05 | DRG: 840 | Disposition: A | Discharge status: Home Health | Attending: Family Medicine | Admitting: Family Medicine

## 2023-08-01 ENCOUNTER — Inpatient Hospital Stay: Admitting: Hematology

## 2023-08-01 ENCOUNTER — Other Ambulatory Visit: Payer: Self-pay

## 2023-08-01 VITALS — BP 122/54 | HR 68 | Temp 98.3°F | Resp 18 | Ht 59.0 in | Wt 141.5 lb

## 2023-08-01 DIAGNOSIS — Z806 Family history of leukemia: Secondary | ICD-10-CM

## 2023-08-01 DIAGNOSIS — G4700 Insomnia, unspecified: Secondary | ICD-10-CM | POA: Diagnosis present

## 2023-08-01 DIAGNOSIS — J189 Pneumonia, unspecified organism: Principal | ICD-10-CM | POA: Diagnosis present

## 2023-08-01 DIAGNOSIS — I129 Hypertensive chronic kidney disease with stage 1 through stage 4 chronic kidney disease, or unspecified chronic kidney disease: Secondary | ICD-10-CM | POA: Diagnosis present

## 2023-08-01 DIAGNOSIS — D539 Nutritional anemia, unspecified: Secondary | ICD-10-CM | POA: Diagnosis present

## 2023-08-01 DIAGNOSIS — M8580 Other specified disorders of bone density and structure, unspecified site: Secondary | ICD-10-CM | POA: Diagnosis present

## 2023-08-01 DIAGNOSIS — D591 Autoimmune hemolytic anemia, unspecified: Secondary | ICD-10-CM | POA: Diagnosis present

## 2023-08-01 DIAGNOSIS — N1832 Chronic kidney disease, stage 3b: Secondary | ICD-10-CM | POA: Diagnosis present

## 2023-08-01 DIAGNOSIS — N179 Acute kidney failure, unspecified: Secondary | ICD-10-CM | POA: Diagnosis present

## 2023-08-01 DIAGNOSIS — D589 Hereditary hemolytic anemia, unspecified: Secondary | ICD-10-CM | POA: Diagnosis present

## 2023-08-01 DIAGNOSIS — R1900 Intra-abdominal and pelvic swelling, mass and lump, unspecified site: Secondary | ICD-10-CM

## 2023-08-01 DIAGNOSIS — K5900 Constipation, unspecified: Secondary | ICD-10-CM | POA: Diagnosis present

## 2023-08-01 DIAGNOSIS — Z1152 Encounter for screening for COVID-19: Secondary | ICD-10-CM | POA: Diagnosis not present

## 2023-08-01 DIAGNOSIS — Z8249 Family history of ischemic heart disease and other diseases of the circulatory system: Secondary | ICD-10-CM | POA: Diagnosis not present

## 2023-08-01 DIAGNOSIS — Z90710 Acquired absence of both cervix and uterus: Secondary | ICD-10-CM

## 2023-08-01 DIAGNOSIS — C9110 Chronic lymphocytic leukemia of B-cell type not having achieved remission: Secondary | ICD-10-CM

## 2023-08-01 DIAGNOSIS — C8590 Non-Hodgkin lymphoma, unspecified, unspecified site: Secondary | ICD-10-CM | POA: Diagnosis present

## 2023-08-01 DIAGNOSIS — I10 Essential (primary) hypertension: Secondary | ICD-10-CM | POA: Diagnosis present

## 2023-08-01 DIAGNOSIS — E785 Hyperlipidemia, unspecified: Secondary | ICD-10-CM | POA: Diagnosis present

## 2023-08-01 DIAGNOSIS — N1330 Unspecified hydronephrosis: Secondary | ICD-10-CM | POA: Diagnosis present

## 2023-08-01 DIAGNOSIS — E875 Hyperkalemia: Secondary | ICD-10-CM | POA: Insufficient documentation

## 2023-08-01 DIAGNOSIS — Z833 Family history of diabetes mellitus: Secondary | ICD-10-CM

## 2023-08-01 DIAGNOSIS — G629 Polyneuropathy, unspecified: Secondary | ICD-10-CM | POA: Diagnosis present

## 2023-08-01 LAB — CBC WITH DIFFERENTIAL/PLATELET
Abs Immature Granulocytes: 0.04 10*3/uL (ref 0.00–0.07)
Basophils Absolute: 0 10*3/uL (ref 0.0–0.1)
Basophils Relative: 0 %
Eosinophils Absolute: 0.1 10*3/uL (ref 0.0–0.5)
Eosinophils Relative: 1 %
HCT: 34.7 % — ABNORMAL LOW (ref 36.0–46.0)
Hemoglobin: 10.9 g/dL — ABNORMAL LOW (ref 12.0–15.0)
Immature Granulocytes: 0 %
Lymphocytes Relative: 16 %
Lymphs Abs: 1.6 10*3/uL (ref 0.7–4.0)
MCH: 28.3 pg (ref 26.0–34.0)
MCHC: 31.4 g/dL (ref 30.0–36.0)
MCV: 90.1 fL (ref 80.0–100.0)
Monocytes Absolute: 1.2 10*3/uL — ABNORMAL HIGH (ref 0.1–1.0)
Monocytes Relative: 12 %
Neutro Abs: 6.9 10*3/uL (ref 1.7–7.7)
Neutrophils Relative %: 71 %
Platelets: 277 10*3/uL (ref 150–400)
RBC: 3.85 MIL/uL — ABNORMAL LOW (ref 3.87–5.11)
RDW: 12.7 % (ref 11.5–15.5)
WBC: 9.9 10*3/uL (ref 4.0–10.5)
nRBC: 0 % (ref 0.0–0.2)

## 2023-08-01 LAB — COMPREHENSIVE METABOLIC PANEL WITH GFR
ALT: 33 U/L (ref 0–44)
AST: 36 U/L (ref 15–41)
Albumin: 3.4 g/dL — ABNORMAL LOW (ref 3.5–5.0)
Alkaline Phosphatase: 58 U/L (ref 38–126)
Anion gap: 12 (ref 5–15)
BUN: 59 mg/dL — ABNORMAL HIGH (ref 8–23)
CO2: 22 mmol/L (ref 22–32)
Calcium: 10.6 mg/dL — ABNORMAL HIGH (ref 8.9–10.3)
Chloride: 99 mmol/L (ref 98–111)
Creatinine, Ser: 2.16 mg/dL — ABNORMAL HIGH (ref 0.44–1.00)
GFR, Estimated: 22 mL/min — ABNORMAL LOW (ref >60)
Glucose, Bld: 86 mg/dL (ref 70–99)
Potassium: 5.6 mmol/L — ABNORMAL HIGH (ref 3.5–5.1)
Sodium: 133 mmol/L — ABNORMAL LOW (ref 135–145)
Total Bilirubin: 1.2 mg/dL (ref 0.0–1.2)
Total Protein: 6.8 g/dL (ref 6.5–8.1)

## 2023-08-01 LAB — URINALYSIS, ROUTINE W REFLEX MICROSCOPIC
Bacteria, UA: NONE SEEN
Bilirubin Urine: NEGATIVE
Glucose, UA: NEGATIVE mg/dL
Hgb urine dipstick: NEGATIVE
Ketones, ur: NEGATIVE mg/dL
Nitrite: NEGATIVE
Protein, ur: NEGATIVE mg/dL
Specific Gravity, Urine: 1.016 (ref 1.005–1.030)
pH: 5 (ref 5.0–8.0)

## 2023-08-01 LAB — LIPASE, BLOOD: Lipase: 73 U/L — ABNORMAL HIGH (ref 11–51)

## 2023-08-01 LAB — RESP PANEL BY RT-PCR (RSV, FLU A&B, COVID)  RVPGX2
Influenza A by PCR: NEGATIVE
Influenza B by PCR: NEGATIVE
Resp Syncytial Virus by PCR: NEGATIVE
SARS Coronavirus 2 by RT PCR: NEGATIVE

## 2023-08-01 MED ORDER — ONDANSETRON HCL 4 MG/2ML IJ SOLN
4.0000 mg | Freq: Four times a day (QID) | INTRAMUSCULAR | Status: DC | PRN
  Administered 2023-08-03: 4 mg via INTRAVENOUS
  Filled 2023-08-01: qty 2

## 2023-08-01 MED ORDER — ACETAMINOPHEN 650 MG RE SUPP: 650.0000 mg | Freq: Four times a day (QID) | RECTAL | Status: DC | PRN

## 2023-08-01 MED ORDER — SODIUM CHLORIDE 0.9 % IV BOLUS
1000.0000 mL | Freq: Once | INTRAVENOUS | Status: AC
  Administered 2023-08-01: 1000 mL via INTRAVENOUS

## 2023-08-01 MED ORDER — SENNOSIDES-DOCUSATE SODIUM 8.6-50 MG PO TABS
2.0000 | ORAL_TABLET | Freq: Two times a day (BID) | ORAL | Status: DC
  Administered 2023-08-01 – 2023-08-03 (×4): 2 via ORAL
  Filled 2023-08-01 (×6): qty 2

## 2023-08-01 MED ORDER — POLYETHYLENE GLYCOL 3350 17 G PO PACK: 17.0000 g | PACK | Freq: Every day | ORAL | Status: DC | PRN

## 2023-08-01 MED ORDER — ACETAMINOPHEN 325 MG PO TABS
650.0000 mg | ORAL_TABLET | Freq: Four times a day (QID) | ORAL | Status: DC | PRN
  Filled 2023-08-01: qty 2

## 2023-08-01 MED ORDER — SODIUM CHLORIDE 0.9 % IV SOLN
2.0000 g | Freq: Once | INTRAVENOUS | Status: AC
  Administered 2023-08-01: 2 g via INTRAVENOUS
  Filled 2023-08-01: qty 20

## 2023-08-01 MED ORDER — ALBUTEROL SULFATE (2.5 MG/3ML) 0.083% IN NEBU: 2.5000 mg | INHALATION_SOLUTION | RESPIRATORY_TRACT | Status: DC | PRN

## 2023-08-01 MED ORDER — SODIUM CHLORIDE 0.9 % IV SOLN
500.0000 mg | Freq: Once | INTRAVENOUS | Status: AC
  Administered 2023-08-01: 500 mg via INTRAVENOUS
  Filled 2023-08-01: qty 5

## 2023-08-01 MED ORDER — HEPARIN SODIUM (PORCINE) 5000 UNIT/ML IJ SOLN: 5000.0000 [IU] | Freq: Three times a day (TID) | INTRAMUSCULAR | Status: DC

## 2023-08-01 MED ORDER — AMLODIPINE BESYLATE 5 MG PO TABS
5.0000 mg | ORAL_TABLET | Freq: Every day | ORAL | Status: DC
  Administered 2023-08-02 – 2023-08-05 (×3): 5 mg via ORAL
  Filled 2023-08-01 (×4): qty 1

## 2023-08-01 MED ORDER — SODIUM CHLORIDE 0.9 % IV SOLN: INTRAVENOUS | Status: AC

## 2023-08-01 MED ORDER — ONDANSETRON HCL 4 MG PO TABS: 4.0000 mg | ORAL_TABLET | Freq: Four times a day (QID) | ORAL | Status: DC | PRN

## 2023-08-01 NOTE — ED Provider Notes (Signed)
 [facility] AT [facility] Provider Note   CSN: [id number] Arrival date & time: 08/01/23  1201     History  Chief Complaint  Patient presents with   Flank Pain    Jacqueline Hancock is a 83 y.o. female.  83 yo F with a chief complaint of not feeling well.  Hot and cold cough congestion going on for about 3 to 4 days now.  She went to urgent care about 48 hours ago.  They sent off blood work that is resulted with leukocytosis and acute kidney injury.  She went to see her oncologist today who is worried about her PET scan with some right perirenal findings.  They sent her to the ED for evaluation.  Patient has some mild diffuse abdominal discomfort.  She thinks is maybe worse on the left.  Still feels rundown from the illness that she has had but is coughing a bit less.  No chest pain.   Flank Pain       Home Medications Prior to Admission medications   Medication Sig Start Date End Date Taking? Authorizing Provider  amLODipine  (NORVASC ) 5 MG tablet Take 1 tablet (5 mg total) by mouth daily. 06/14/23   Ngetich, Dinah C, NP  B Complex Vitamins (VITAMIN B-COMPLEX PO) Take 1 tablet by mouth daily.    [provider]  Ferrous Sulfate  (IRON) 325 (65 Fe) MG TABS Take 325 mg by mouth daily.     [provider]  Loratadine (CLARITIN PO) Take by mouth daily.    [provider]  nadolol  (CORGARD ) 20 MG tablet TAKE 1 TABLET BY MOUTH DAILY FOR BLOOD PRESSURE OR TREMORS 07/27/23   Ngetich, Dinah C, NP  ondansetron  (ZOFRAN ) 4 MG tablet Take 1 tablet (4 mg total) by mouth every 8 (eight) hours as needed for nausea or vomiting. 06/18/23   Rai, Hurman Maiden, MD  polyethylene glycol (MIRALAX  / GLYCOLAX ) 17 g packet Take 17 g by mouth daily as needed for mild constipation or moderate constipation. Also available OTC 06/18/23   Rai, Ripudeep K, MD  potassium chloride  (MICRO-K ) 10 MEQ CR capsule Take 2 capsules (20 mEq total) by mouth daily. 07/18/23  10/16/23  Ngetich, Dinah C, NP  senna-docusate (SENOKOT-S) 8.6-50 MG tablet Take 2 tablets by mouth at bedtime. For constipation 06/18/23   Rai, Ripudeep K, MD  torsemide  (DEMADEX ) 20 MG tablet TAKE 1 TABLET BY MOUTH EVERY DAY AS NEEDED FOR LEG EDEMA OR ABRUPT WEIGHT GAIN 07/06/23   Ngetich, Dinah C, NP  traMADol  (ULTRAM ) 50 MG tablet Take 1 tablet (50 mg total) by mouth every 8 (eight) hours as needed for moderate pain (pain score 4-6) or severe pain (pain score 7-10) (pain). 06/18/23   Rai, Hurman Maiden, MD  gabapentin  (NEURONTIN ) 100 MG capsule Take 1 capsule 3 x /day for Neuropathy Pain Patient not taking: Reported on 01/25/2020 08/02/19 01/25/20  Vangie Genet, MD      Allergies    Nexium [esomeprazole magnesium] and Peanut butter flavoring agent (non-screening)    Review of Systems   Review of Systems  Genitourinary:  Positive for flank pain.    Physical Exam Updated Vital Signs BP (!) 123/100   Pulse 72   Temp 98.4 F (36.9 C) (Oral)   Resp 12   SpO2 100%  Physical Exam Vitals and nursing note reviewed.  Constitutional:      General: She is not in acute distress.    Appearance: She is well-developed. She is  not diaphoretic.  HENT:     Head: Normocephalic and atraumatic.  Eyes:     Pupils: Pupils are equal, round, and reactive to light.  Cardiovascular:     Rate and Rhythm: Normal rate and regular rhythm.     Heart sounds: No murmur heard.    No friction rub. No gallop.  Pulmonary:     Effort: Pulmonary effort is normal.     Breath sounds: No wheezing or rales.  Abdominal:     General: There is distension.     Palpations: Abdomen is soft.     Tenderness: There is abdominal tenderness.     Comments: Mild diffuse abdominal tenderness  Musculoskeletal:        General: No tenderness.     Cervical back: Normal range of motion and neck supple.  Skin:    General: Skin is warm and dry.  Neurological:     Mental Status: She is alert and oriented to person, place, and time.   Psychiatric:        Behavior: Behavior normal.     ED Results / Procedures / Treatments   Labs (all labs ordered are listed, but only abnormal results are displayed) Labs Reviewed  CBC WITH DIFFERENTIAL/PLATELET - Abnormal; Notable for the following components:      Result Value   RBC 3.85 (*)    Hemoglobin 10.9 (*)    HCT 34.7 (*)    Monocytes Absolute 1.2 (*)    All other components within normal limits  COMPREHENSIVE METABOLIC PANEL WITH GFR - Abnormal; Notable for the following components:   Sodium 133 (*)    Potassium 5.6 (*)    BUN 59 (*)    Creatinine, Ser 2.16 (*)    Calcium 10.6 (*)    Albumin 3.4 (*)    GFR, Estimated 22 (*)    All other components within normal limits  LIPASE, BLOOD - Abnormal; Notable for the following components:   Lipase 73 (*)    All other components within normal limits  RESP PANEL BY RT-PCR (RSV, FLU A&B, COVID)  [id number]  CULTURE, BLOOD (ROUTINE X 2)  CULTURE, BLOOD (ROUTINE X 2)  URINALYSIS, ROUTINE W REFLEX MICROSCOPIC    EKG None  Radiology CT Renal Stone Study Result Date: 08/01/2023 CLINICAL DATA:  Abdominal/flank pain, stone suspected EXAM: CT ABDOMEN AND PELVIS WITHOUT CONTRAST TECHNIQUE: Multidetector CT imaging of the abdomen and pelvis was performed following the standard protocol without IV contrast. RADIATION DOSE REDUCTION: This exam was performed according to the departmental dose-optimization program which includes automated exposure control, adjustment of the mA and/or kV according to patient size and/or use of iterative reconstruction technique. COMPARISON:  June 14, 2023 FINDINGS: Of note, the lack of intravenous contrast limits evaluation of the solid organ parenchyma and vascularity. Lower chest: Interlobular septal thickening with patchy ground-glass opacities in the lung bases. No pleural effusion. Hepatobiliary: No mass. Decompressed gallbladder without radiopaque stones or wall thickening. No intrahepatic or  extrahepatic biliary ductal dilation. Pancreas: No mass or main ductal dilation. No peripancreatic inflammation or fluid collection. None in the a he air Spleen: Normal size. No mass. Adrenals/Urinary Tract: No left adrenal mass. The right adrenal gland is not well visualized. The right renal parenchyma is not well visualized due to the lack of intravenous contrast and large amount of soft tissue attenuation filling the right renal fossa, measuring 13.7 x 9.7 x 17.8 cm (unchanged from the prior PET-CT, but larger than on the prior CT, where it  measured 10.5 x 9.9 x 15.9 cm). Moderate right-sided hydronephrosis with abrupt narrowing in the region of the UPJ, likely due to extrinsic compression. The urinary bladder is completely decompressed. Stomach/Bowel: The stomach is decompressed without focal abnormality. No small bowel wall thickening or inflammation. No small bowel obstruction. The appendix was not visualized. No right lower quadrant or pericecal inflammatory changes to suggest acute appendicitis. Vascular/Lymphatic: No aortic aneurysm. Diffuse aortoiliac atherosclerosis. Numerous subcentimeter upper retroperitoneal lymph nodes measuring up to 7 mm, likely reactive. Reproductive: Hysterectomy. No concerning adnexal mass. Other: No pneumoperitoneum. A large amount of right retroperitoneal hemorrhage extending into the pelvis with additional hemorrhage extending in throughout the left retroperitoneal space, into the root of the mesentery, and along the left retroperitoneal space. Small volume perihepatic and perisplenic ascites. Musculoskeletal: No acute fracture or destructive lesion. IMPRESSION: 1. Redemonstrated abnormal soft tissue density material filling the right renal fossa, measuring 13.7 x 9.7 x 17.8 cm (unchanged from the prior PET-CT, but larger than the prior CT from March 12, where it measured 10.5 x 9.9 x 5.9 cm). Worsening soft tissue extending throughout the right and left retroperitoneal  spaces, into the mesentery, and into the pelvis. Given the appearance on the prior studies, this may represent worsening retroperitoneal hematoma or soft tissue infiltration from possible lymphoma given the patient's history. On the prior PET-CT, the increased radiotracer activity could also be related to underlying collecting system injury in the setting of renal trauma. A multiphase CT of the abdomen and pelvis (with collecting phase imaging), recommended for further characterization. 2. Moderate right-sided hydronephrosis with abrupt narrowing at the UPJ, likely due to extrinsic compression from the perinephric and retroperitoneal soft tissue. 3. Findings in the lung bases, worrisome for either a developing bronchopneumonia, which includes viral etiologies, or early pulmonary edema. Critical Value/emergent results were called by telephone at the time of interpretation on 08/01/2023 at 3:28 pm to provider Eliah Ozawa , who verbally acknowledged these results. Electronically Signed   By: Rance Burrows M.D.   On: 08/01/2023 15:53   DG Chest Port 1 View Result Date: 08/01/2023 CLINICAL DATA:  Cough and shortness of breath. EXAM: PORTABLE CHEST 1 VIEW COMPARISON:  07/30/2023 FINDINGS: Single-view of the chest. Lungs are clear except for questionable mild blunting at the right costophrenic angle. Heart size is within normal limits and stable. Trachea is midline. Degenerative changes at the bilateral AC joints. IMPRESSION: 1. Questionable blunting at the right costophrenic angle. Cannot exclude small right pleural effusion. Otherwise, no acute chest finding. Electronically Signed   By: Elene Griffes M.D.   On: 08/01/2023 14:17   DG Chest 2 View Result Date: 07/30/2023 CLINICAL DATA:  Initial evaluation for shortness of breath. EXAM: CHEST - 2 VIEW COMPARISON:  Prior radiograph from 07/12/2023. FINDINGS: Transverse heart size stable, and remains within normal limits. Mediastinal silhouette within normal limits. Lungs  are hypoinflated with scattered basilar predominant fibrotic lung changes, consistent with history of fibrotic lung disease. Slightly increased prominence of bibasilar interstitial markings could reflect superimposed interstitial edema or possibly atypical pneumonitis. No frank consolidative airspace disease. Trace right pleural effusion. No overt pulmonary edema or pneumothorax. Visualized osseous structures and soft tissues demonstrate no acute finding. IMPRESSION: 1. Hypoinflation with scattered basilar predominant fibrotic lung changes, consistent with history of fibrotic lung disease. 2. Slightly increased prominence of bibasilar interstitial markings as compared to prior, which could reflect superimposed interstitial edema or possibly atypical pneumonitis. No consolidative airspace disease. 3. Trace right pleural effusion. Electronically Signed  By: Virgia Griffins M.D.   On: 07/30/2023 18:17    Procedures Procedures    Medications Ordered in ED Medications  cefTRIAXone (ROCEPHIN) 2 g in sodium chloride  0.9 % 100 mL IVPB (has no administration in time range)  azithromycin  (ZITHROMAX ) 500 mg in sodium chloride  0.9 % 250 mL IVPB (has no administration in time range)  sodium chloride  0.9 % bolus 1,000 mL (1,000 mLs Intravenous New Bag/Given 08/01/23 1405)    ED Course/ Medical Decision Making/ A&P                                 Medical Decision Making Amount and/or Complexity of Data Reviewed Labs: ordered. Radiology: ordered.   83 yo F with a cc of fatigue cough congestion.  Going on for a few days.  Was seen at urgent care and was sent home after obtaining labs.  Found to have leukocytosis, aki.  Follow up with oncology this morning, sent here with concern from prior PET scan.  On prior scan radiology read concerning for possible urine leak, they had recommended CT hematuria study but unfortunately due to the patient's GFR would be unable to obtain due to to the need for contrast  administration.  I did discuss this with the radiologist on-call.  He did think there be some utility in a CT Noncon study to start.  Will obtain blood work.  Bolus of IV fluids.  CT renal stone study.   I discussed the radiology read with the radiologist.  He felt it was most likely to be hematoma but recommended CT imaging with IV contrast to better evaluate.  Patient does have pneumonia.  Started on IV antibiotics.  Blood cultures.  Discussed with medicine for admission.  The patients results and plan were reviewed and discussed.   Any x-rays performed were independently reviewed by myself.   Differential diagnosis were considered with the presenting HPI.  Medications  cefTRIAXone (ROCEPHIN) 2 g in sodium chloride  0.9 % 100 mL IVPB (has no administration in time range)  azithromycin  (ZITHROMAX ) 500 mg in sodium chloride  0.9 % 250 mL IVPB (has no administration in time range)  sodium chloride  0.9 % bolus 1,000 mL (1,000 mLs Intravenous New Bag/Given 08/01/23 1405)    Vitals:   08/01/23 1220  BP: (!) 123/100  Pulse: 72  Resp: 12  Temp: 98.4 F (36.9 C)  TempSrc: Oral  SpO2: 100%    Final diagnoses:  Pneumonia of right lower lobe due to infectious organism    Admission/ observation were discussed with the admitting physician, patient and/or family and they are comfortable with the plan.          Final Clinical Impression(s) / ED Diagnoses Final diagnoses:  Pneumonia of right lower lobe due to infectious organism    Rx / DC Orders ED Discharge Orders     None         Albertus Hughs, DO 08/01/23 1609

## 2023-08-01 NOTE — H&P (Signed)
 History and Physical  Jacqueline Hancock WUJ:[id number] DOB: 1940-10-19 DOA: 08/01/2023  PCP: Estil Heman, NP   Chief Complaint: Perinephric findings on PET scan  HPI: Jacqueline Hancock is a 83 y.o. female with medical history significant for non-Hodgkin's lymphoma, hemolytic anemia being admitted to the hospital with fatigue, acute kidney injury and concern for continued perinephric abnormality seen on PET scan and CT.  She was actually admitted to [facility] 3/12 to 06/18/2023 after a fall at home, and imaging at that time showed evidence of what was felt to be traumatic perinephric hematoma of the right side, and grade 5 renal laceration.  She was seen by urology Dr. Cathi Cluster and there was no recommendation for intervention at the time.  Hemoglobin was stable, 10.7 at discharge.  During that hospitalization, she was also noted to have hypercalcemia 15.0, treated with IV fluids, Zometa  and calcitonin, hypercalcemia improved to 10.1 by the time of discharge.  Since discharge from the hospital, overall she has been feeling decently well, she had a PET scan done on outpatient basis interpreted on 4/23 which showed concern for increased in the volume of right perinephric, periureteral, retroperitoneal and mesenteric density which was hypermetabolic.  There is concerned that much of the appearance could be related to lymphoma rather than blood products, or combination of both.  Overall patient's symptoms have been stable, she has chronic constipation but over the last few days she has also noticed some increased abdominal distention, has only had intermittent bowel movements which is typical for her.  She does take stool softeners and MiraLAX , but not on a daily basis.  Review of Systems: Please see HPI for pertinent positives and negatives. A complete 10 system review of systems are otherwise negative.  Past Medical History:  Diagnosis Date   Dyspnea 2021   with exersion    Early cataracts, bilateral     Elevated hemoglobin A1c    Fibroids    GERD (gastroesophageal reflux disease)    Hyperlipidemia    Hypertension    Lymphoma (HCC)    Osteopenia    Past Surgical History:  Procedure Laterality Date   ABDOMINAL HYSTERECTOMY     APPENDECTOMY     CATARACT EXTRACTION, BILATERAL Bilateral 09/2017   Dr. 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   TONSILLECTOMY AND ADENOIDECTOMY     Social History:  reports that she has never smoked. She has never used smokeless tobacco. She reports that she does not drink alcohol and does not use drugs.  Allergies  Allergen Reactions   Nexium [Esomeprazole Magnesium] Other (See Comments)    Headache   Peanut Butter Flavoring Agent (Non-Screening) Itching    Family History  Problem Relation Age of Onset   Leukemia Mother    Hypertension Father    Diabetes Sister    Heart disease Brother    Diabetes Brother    Diabetes Brother    Hypertension Son      Prior to Admission medications   Medication Sig Start Date End Date Taking? Authorizing Provider  amLODipine  (NORVASC ) 5 MG tablet Take 1 tablet (5 mg total) by mouth daily. 06/14/23   Ngetich, Dinah C, NP  B Complex Vitamins (VITAMIN B-COMPLEX PO) Take 1 tablet by mouth daily.    [provider]  Ferrous Sulfate  (IRON) 325 (65 Fe) MG TABS Take 325 mg by mouth daily.     [provider]  Loratadine (CLARITIN PO) Take by mouth daily.  [provider]  nadolol  (CORGARD ) 20 MG tablet TAKE 1 TABLET BY MOUTH DAILY FOR BLOOD PRESSURE OR TREMORS 07/27/23   Ngetich, Dinah C, NP  ondansetron  (ZOFRAN ) 4 MG tablet Take 1 tablet (4 mg total) by mouth every 8 (eight) hours as needed for nausea or vomiting. 06/18/23   Rai, Hurman Maiden, MD  polyethylene glycol (MIRALAX  / GLYCOLAX ) 17 g packet Take 17 g by mouth daily as needed for mild constipation or moderate constipation. Also available OTC 06/18/23   Rai, Hurman Maiden, MD  potassium chloride  (MICRO-K ) 10 MEQ CR capsule Take 2  capsules (20 mEq total) by mouth daily. 07/18/23 10/16/23  Ngetich, Dinah C, NP  senna-docusate (SENOKOT-S) 8.6-50 MG tablet Take 2 tablets by mouth at bedtime. For constipation 06/18/23   Rai, Hurman Maiden, MD  torsemide  (DEMADEX ) 20 MG tablet TAKE 1 TABLET BY MOUTH EVERY DAY AS NEEDED FOR LEG EDEMA OR ABRUPT WEIGHT GAIN 07/06/23   Ngetich, Dinah C, NP  traMADol  (ULTRAM ) 50 MG tablet Take 1 tablet (50 mg total) by mouth every 8 (eight) hours as needed for moderate pain (pain score 4-6) or severe pain (pain score 7-10) (pain). 06/18/23   Rai, Hurman Maiden, MD  gabapentin  (NEURONTIN ) 100 MG capsule Take 1 capsule 3 x /day for Neuropathy Pain Patient not taking: Reported on 01/25/2020 08/02/19 01/25/20  Vangie Genet, MD    Physical Exam: BP (!) 123/100   Pulse 72   Temp 98.4 F (36.9 C) (Oral)   Resp 12   SpO2 100%  General:  Alert, oriented, calm, in no acute distress, her son is at the bedside Cardiovascular: RRR, no murmurs or rubs, no peripheral edema  Respiratory: clear to auscultation bilaterally, no wheezes, no crackles  Abdomen: soft, nontender, distended, normal bowel tones heard  Skin: dry, no rashes  Musculoskeletal: no joint effusions, normal range of motion  Psychiatric: appropriate affect, normal speech  Neurologic: extraocular muscles intact, clear speech, moving all extremities with intact sensorium         Labs on Admission:  Basic Metabolic Panel: Recent Labs  Lab 07/30/23 1803 08/01/23 1412  NA 134* 133*  K 5.6* 5.6*  CL 98 99  CO2 21* 22  GLUCOSE 100* 86  BUN 58* 59*  CREATININE 2.42* 2.16*  CALCIUM 11.6* 10.6*   Liver Function Tests: Recent Labs  Lab 07/30/23 1803 08/01/23 1412  AST 45* 36  ALT 37 33  ALKPHOS 56 58  BILITOT 1.1 1.2  PROT 6.8 6.8  ALBUMIN 3.4* 3.4*   Recent Labs  Lab 08/01/23 1412  LIPASE 73*   No results for input(s): "AMMONIA" in the last 168 hours. CBC: Recent Labs  Lab 07/30/23 1803 08/01/23 1412  WBC 12.3* 9.9  NEUTROABS  8.6* 6.9  HGB 12.0 10.9*  HCT 36.6 34.7*  MCV 88.4 90.1  PLT 324 277   Cardiac Enzymes: No results for input(s): "CKTOTAL", "CKMB", "CKMBINDEX", "TROPONINI" in the last 168 hours. BNP (last 3 results) No results for input(s): "BNP" in the last 8760 hours.  ProBNP (last 3 results) Recent Labs    02/21/23 1206  PROBNP 14.0    CBG: No results for input(s): "GLUCAP" in the last 168 hours.  Radiological Exams on Admission: CT Renal Stone Study Result Date: 08/01/2023 CLINICAL DATA:  Abdominal/flank pain, stone suspected EXAM: CT ABDOMEN AND PELVIS WITHOUT CONTRAST TECHNIQUE: Multidetector CT imaging of the abdomen and pelvis was performed following the standard protocol without IV contrast. RADIATION DOSE REDUCTION: This exam was performed according  to the departmental dose-optimization program which includes automated exposure control, adjustment of the mA and/or kV according to patient size and/or use of iterative reconstruction technique. COMPARISON:  June 14, 2023 FINDINGS: Of note, the lack of intravenous contrast limits evaluation of the solid organ parenchyma and vascularity. Lower chest: Interlobular septal thickening with patchy ground-glass opacities in the lung bases. No pleural effusion. Hepatobiliary: No mass. Decompressed gallbladder without radiopaque stones or wall thickening. No intrahepatic or extrahepatic biliary ductal dilation. Pancreas: No mass or main ductal dilation. No peripancreatic inflammation or fluid collection. None in the a he air Spleen: Normal size. No mass. Adrenals/Urinary Tract: No left adrenal mass. The right adrenal gland is not well visualized. The right renal parenchyma is not well visualized due to the lack of intravenous contrast and large amount of soft tissue attenuation filling the right renal fossa, measuring 13.7 x 9.7 x 17.8 cm (unchanged from the prior PET-CT, but larger than on the prior CT, where it measured 10.5 x 9.9 x 15.9 cm). Moderate  right-sided hydronephrosis with abrupt narrowing in the region of the UPJ, likely due to extrinsic compression. The urinary bladder is completely decompressed. Stomach/Bowel: The stomach is decompressed without focal abnormality. No small bowel wall thickening or inflammation. No small bowel obstruction. The appendix was not visualized. No right lower quadrant or pericecal inflammatory changes to suggest acute appendicitis. Vascular/Lymphatic: No aortic aneurysm. Diffuse aortoiliac atherosclerosis. Numerous subcentimeter upper retroperitoneal lymph nodes measuring up to 7 mm, likely reactive. Reproductive: Hysterectomy. No concerning adnexal mass. Other: No pneumoperitoneum. A large amount of right retroperitoneal hemorrhage extending into the pelvis with additional hemorrhage extending in throughout the left retroperitoneal space, into the root of the mesentery, and along the left retroperitoneal space. Small volume perihepatic and perisplenic ascites. Musculoskeletal: No acute fracture or destructive lesion. IMPRESSION: 1. Redemonstrated abnormal soft tissue density material filling the right renal fossa, measuring 13.7 x 9.7 x 17.8 cm (unchanged from the prior PET-CT, but larger than the prior CT from March 12, where it measured 10.5 x 9.9 x 5.9 cm). Worsening soft tissue extending throughout the right and left retroperitoneal spaces, into the mesentery, and into the pelvis. Given the appearance on the prior studies, this may represent worsening retroperitoneal hematoma or soft tissue infiltration from possible lymphoma given the patient's history. On the prior PET-CT, the increased radiotracer activity could also be related to underlying collecting system injury in the setting of renal trauma. A multiphase CT of the abdomen and pelvis (with collecting phase imaging), recommended for further characterization. 2. Moderate right-sided hydronephrosis with abrupt narrowing at the UPJ, likely due to extrinsic  compression from the perinephric and retroperitoneal soft tissue. 3. Findings in the lung bases, worrisome for either a developing bronchopneumonia, which includes viral etiologies, or early pulmonary edema. Critical Value/emergent results were called by telephone at the time of interpretation on 08/01/2023 at 3:28 pm to provider DAN FLOYD , who verbally acknowledged these results. Electronically Signed   By: Rance Burrows M.D.   On: 08/01/2023 15:53   DG Chest Port 1 View Result Date: 08/01/2023 CLINICAL DATA:  Cough and shortness of breath. EXAM: PORTABLE CHEST 1 VIEW COMPARISON:  07/30/2023 FINDINGS: Single-view of the chest. Lungs are clear except for questionable mild blunting at the right costophrenic angle. Heart size is within normal limits and stable. Trachea is midline. Degenerative changes at the bilateral AC joints. IMPRESSION: 1. Questionable blunting at the right costophrenic angle. Cannot exclude small right pleural effusion. Otherwise, no acute chest finding.  Electronically Signed   By: Elene Griffes M.D.   On: 08/01/2023 14:17   DG Chest 2 View Result Date: 07/30/2023 CLINICAL DATA:  Initial evaluation for shortness of breath. EXAM: CHEST - 2 VIEW COMPARISON:  Prior radiograph from 07/12/2023. FINDINGS: Transverse heart size stable, and remains within normal limits. Mediastinal silhouette within normal limits. Lungs are hypoinflated with scattered basilar predominant fibrotic lung changes, consistent with history of fibrotic lung disease. Slightly increased prominence of bibasilar interstitial markings could reflect superimposed interstitial edema or possibly atypical pneumonitis. No frank consolidative airspace disease. Trace right pleural effusion. No overt pulmonary edema or pneumothorax. Visualized osseous structures and soft tissues demonstrate no acute finding. IMPRESSION: 1. Hypoinflation with scattered basilar predominant fibrotic lung changes, consistent with history of fibrotic lung  disease. 2. Slightly increased prominence of bibasilar interstitial markings as compared to prior, which could reflect superimposed interstitial edema or possibly atypical pneumonitis. No consolidative airspace disease. 3. Trace right pleural effusion. Electronically Signed   By: Virgia Griffins M.D.   On: 07/30/2023 18:17   Assessment/Plan ARRYANNA WATTON is a 83 y.o. female with medical history significant for non-Hodgkin's lymphoma, hemolytic anemia being admitted to the hospital with fatigue, acute kidney injury and concern for continued perinephric abnormality seen on PET scan and CT.  Soft tissue density in the right renal fossa-seems to have expanded since prior imaging noted above.  Differential would include urine leak, perinephric hematoma, lymphoma, etc.  Currently, given her AKI, imaging modalities are limited. -Inpatient admission -Avoid blood thinners, though her hemoglobin is stable and she denies any hematuria -Will hydrate gently and try to improve her GFR, with plan for multiphase CT of the abdomen pelvis, versus MRI with contrast renal protocol when able to do so safely -Pending the above, she may need urology evaluation for intervention, versus IR consultation for biopsy if there is concern for lymphoma  Non-Hodgkin's lymphoma-under the care of Dr. Salomon Cree, who has been added to inpatient treatment team  AKI superimposed on CKD-avoid nephrotoxins, hydrate gently and monitor renal function with daily labs  Hypertension-amlodipine   She was given a dose of IV Rocephin and IV azithromycin  due to lung findings on CT, however patient is saturating 100% on room air, afebrile, without leukocytosis or any pulmonary symptoms.  Will not plan on giving further doses of antibiotics at this time.  DVT prophylaxis: SCDs only    Code Status: Full Code  Consults called: None  Admission status: The appropriate patient status for this patient is INPATIENT. Inpatient status is judged to be  reasonable and necessary in order to provide the required intensity of service to ensure the patient's safety. The patient's presenting symptoms, physical exam findings, and initial radiographic and laboratory data in the context of their chronic comorbidities is felt to place them at high risk for further clinical deterioration. Furthermore, it is not anticipated that the patient will be medically stable for discharge from the hospital within 2 midnights of admission.    I certify that at the point of admission it is my clinical judgment that the patient will require inpatient hospital care spanning beyond 2 midnights from the point of admission due to high intensity of service, high risk for further deterioration and high frequency of surveillance required  Time spent: 55 minutes  Dorell Gatlin Rickey Charm MD [facility] Pager [phone]  If 7PM-7AM, please contact night-coverage [email or web address] Password [facility]  08/01/2023, 4:15 PM

## 2023-08-01 NOTE — ED Triage Notes (Addendum)
 Per charge RN, patient presents from the cancer center due to right flank pain post fall about a month ago. There is concern there may be a urine leak.

## 2023-08-01 NOTE — Progress Notes (Signed)
 HEMATOLOGY/ONCOLOGY CLINIC NOTE  Date of Service: 08/01/23  Patient Care Team: Ngetich, Elijio Guadeloupe, NP as PCP - General (Family Medicine) Lenton Rail, MD as Consulting Physician (Otolaryngology) Serafin Dames, MD as Consulting Physician (Gastroenterology) Coy Ditty, Adelaida Adie, MD (Inactive) as Consulting Physician (Obstetrics and Gynecology) Albert Huff, MD as Consulting Physician (Ophthalmology) Monika Annas, [facility] (Inactive) as Pharmacist (Pharmacist)  CHIEF COMPLAINTS/PURPOSE OF CONSULTATION:  F/u for CLL/SLL  Prior Therapy:  She is status post bone marrow biopsy completed on March 05, 2020.    Prednisone  60 mg daily in the January 2022 to treat her hemolytic anemia.  This was tapered off after complete hematological response.  HISTORY OF PRESENTING ILLNESS:   Jacqueline Hancock is a wonderful 83 y.o. female who is here for continued evaluation and management of NHL, unspecified body region. Patient was following up with Dr. Dirk Fredericks.   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 BM Bx Cyclin D1 IHC +ve byt FISH neg . She has presented with splenomegaly, anemia, and lymphocytosis. 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   Patient's current treatment is Imbruvica  280 mg. She was started on Imbruvica  560 mg on January 2022, which was then reduced to 280 mg in July 2023.   Patient was last seen by Dr. Dirk Fredericks on 02/10/2022 and she was doing well overall.   Patient reports she has been doing well overall since our last visit. She does complains of itchy skin rashes, mostly around the face and head, for around 2-3 weeks ago. Patient notes that she was started couple of new medications from other physicians, but she is unsure of the names. Patient notes that she was started on oxycodone around 3-4 weeks ago. She discontinued oxycodone.   She notes that there is redness around her face, which is not  new.   Patient is currently taking Imbruvica  280 mg as prescribed and has been tolerating it well without any severe toxicities.   She is currently taking Iron supplement, calcium supplement, and Vitamin-D supplement.   She denies fever, chills, night sweats, unexpected weight loss, back pain, abdominal pain, chest pain, or leg swelling. Patient does complain of occasional abdominal pain.   Patient notes she had a surgery near her right neck/ear area to remove an enlarged lymph node couple years ago. She complains of mild occasional pain near the site.  INTERVAL HISTORY:  Jacqueline Hancock is a wonderful 83 y.o. 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   Patient was last seen by me on 07/12/2023 and she complained of bilateral lower extremities weakness due to hypercalcemia, mild SOB, mild cough, mild wheezing, abdominal bloating, occasional constipation, mild fatigue, leg swelling, and frequent urination at night.   Patient was recently admitted to the Urgent Care on 07/30/2023 due to fatigue, SOB, and chest discomfort. Chest X-Ray did not show any abnormalities. EKG at the urgent care did not show any abnormalities. She was discharged the same day. Labs did show elevated kidney function and hypercalcemia and hyperkalemia, was recommended to go to the ED on 07/31/2023 by her PCP.  Patient is accompanied by her son during this visit. Patient notes she has been doing fairly well since our last visit. She notes that her symptoms that led her to Urgent Care on 07/30/2023 were new sudden symptoms. During this visit, she complains of fatigue, frequent urination at night, insomnia, and SOB. She also complains of  persistent abdominal pain, increasing abdominal fullness and night sweats.   She denies any new infection issues, fever, chills, back pain, chest pain, or leg swelling.   During the physical exam, she complains of severe left lower abdominal pain and mild right lower  abdominal pain.    MEDICAL HISTORY:  Past Medical History:  Diagnosis Date   Dyspnea 2021   with exersion    Early cataracts, bilateral    Elevated hemoglobin A1c    Fibroids    GERD (gastroesophageal reflux disease)    Hyperlipidemia    Hypertension    Lymphoma (HCC)    Osteopenia     SURGICAL HISTORY: Past Surgical History:  Procedure Laterality Date   ABDOMINAL HYSTERECTOMY     APPENDECTOMY     CATARACT EXTRACTION, BILATERAL Bilateral 09/2017   Dr. 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   TONSILLECTOMY AND ADENOIDECTOMY      SOCIAL HISTORY: Social History   Socioeconomic History   Marital status: Widowed    Spouse name: Not on file   Number of children: Not on file   Years of education: Not on file   Highest education level: Not on file  Occupational History   Not on file  Tobacco Use   Smoking status: Never   Smokeless tobacco: Never  Vaping Use   Vaping status: Never Used  Substance and Sexual Activity   Alcohol use: No   Drug use: Never   Sexual activity: Not on file  Other Topics Concern   Not on file  Social History Narrative   Not on file   Social Drivers of Health   Financial Resource Strain: Not on file  Food Insecurity: No Food Insecurity (06/15/2023)   Hunger Vital Sign    Worried About Running Out of Food in the Last Year: Never true    Ran Out of Food in the Last Year: Never true  Transportation Needs: No Transportation Needs (06/15/2023)   PRAPARE - Administrator, Civil Service (Medical): No    Lack of Transportation (Non-Medical): No  Physical Activity: Not on file  Stress: Not on file  Social Connections: Patient Unable To Answer (06/15/2023)   Social Connection and Isolation Panel [NHANES]    Frequency of Communication with Friends and Family: Patient unable to answer    Frequency of Social Gatherings with Friends and Family: Patient unable to answer    Attends Religious Services: Patient unable to  answer    Active Member of Clubs or Organizations: Patient unable to answer    Attends Banker Meetings: Patient unable to answer    Marital Status: Patient unable to answer  Intimate Partner Violence: Not At Risk (06/15/2023)   Humiliation, Afraid, Rape, and Kick questionnaire    Fear of Current or Ex-Partner: No    Emotionally Abused: No    Physically Abused: No    Sexually Abused: No    FAMILY HISTORY: Family History  Problem Relation Age of Onset   Leukemia Mother    Hypertension Father    Diabetes Sister    Heart disease Brother    Diabetes Brother    Diabetes Brother    Hypertension Son     ALLERGIES:  is allergic to nexium [esomeprazole magnesium] and peanut butter flavoring agent (non-screening).  MEDICATIONS:  Current Outpatient Medications  Medication Sig Dispense Refill   amLODipine  (NORVASC ) 5 MG tablet Take 1 tablet (5 mg total) by mouth daily. 30 tablet 3  B Complex Vitamins (VITAMIN B-COMPLEX PO) Take 1 tablet by mouth daily.     Ferrous Sulfate  (IRON) 325 (65 Fe) MG TABS Take 325 mg by mouth daily.      Loratadine (CLARITIN PO) Take by mouth daily.     nadolol  (CORGARD ) 20 MG tablet TAKE 1 TABLET BY MOUTH DAILY FOR BLOOD PRESSURE OR TREMORS 90 tablet 3   ondansetron  (ZOFRAN ) 4 MG tablet Take 1 tablet (4 mg total) by mouth every 8 (eight) hours as needed for nausea or vomiting. 20 tablet 0   polyethylene glycol (MIRALAX  / GLYCOLAX ) 17 g packet Take 17 g by mouth daily as needed for mild constipation or moderate constipation. Also available OTC 30 each 0   potassium chloride  (MICRO-K ) 10 MEQ CR capsule Take 2 capsules (20 mEq total) by mouth daily. 180 capsule 0   senna-docusate (SENOKOT-S) 8.6-50 MG tablet Take 2 tablets by mouth at bedtime. For constipation 60 tablet 1   torsemide  (DEMADEX ) 20 MG tablet TAKE 1 TABLET BY MOUTH EVERY DAY AS NEEDED FOR LEG EDEMA OR ABRUPT WEIGHT GAIN 90 tablet 0   traMADol  (ULTRAM ) 50 MG tablet Take 1 tablet (50 mg  total) by mouth every 8 (eight) hours as needed for moderate pain (pain score 4-6) or severe pain (pain score 7-10) (pain). 20 tablet 0   No current facility-administered medications for this visit.    REVIEW OF SYSTEMS:    10 Point review of Systems was done is negative except as noted above.   PHYSICAL EXAMINATION: ECOG PERFORMANCE STATUS: 2 - Symptomatic, <50% confined to bed  . Vitals:   08/01/23 1105  BP: (!) 122/54  Pulse: 68  Resp: 18  Temp: 98.3 F (36.8 C)  SpO2: 100%   Filed Weights   08/01/23 1105  Weight: 141 lb 8 oz (64.2 kg)   .Body mass index is 28.58 kg/m. GENERAL:alert, in no acute distress and comfortable SKIN: no acute rashes, no significant lesions EYES: conjunctiva are pink and non-injected, sclera anicteric OROPHARYNX: MMM, no exudates, no oropharyngeal erythema or ulceration NECK: supple, no JVD LYMPH:  no palpable lymphadenopathy in the cervical, axillary or inguinal regions LUNGS: clear to auscultation b/l with normal respiratory effort HEART: regular rate & rhythm ABDOMEN:  normoactive bowel sounds , non tender, not distended. Extremity: no pedal edema PSYCH: alert & oriented x 3 with fluent speech NEURO: no focal motor/sensory deficits   LABORATORY DATA:  I have reviewed the data as listed  .    Latest Ref Rng & Units 07/30/2023    6:03 PM 07/17/2023    9:12 AM 07/12/2023    8:57 AM  CBC  WBC 4.0 - 10.5 K/uL 12.3  7.7  6.7   Hemoglobin 12.0 - 15.0 g/dL 16.1  09.6  04.5   Hematocrit 36.0 - 46.0 % 36.6  32.9  35.6   Platelets 150 - 400 K/uL 324  226  217    .    Latest Ref Rng & Units 07/30/2023    6:03 PM 07/17/2023    9:12 AM 07/12/2023    8:57 AM  CMP  Glucose 70 - 99 mg/dL 409  97  811   BUN 8 - 23 mg/dL 58  26  32   Creatinine 0.44 - 1.00 mg/dL 9.14  7.82  9.56   Sodium 135 - 145 mmol/L 134  138  136   Potassium 3.5 - 5.1 mmol/L 5.6  4.5  4.5   Chloride 98 - 111 mmol/L 98  105  102   CO2 22 - 32 mmol/L 21  24  27    Calcium  8.9 - 10.3 mg/dL 29.5  62.1  30.8   Total Protein 6.5 - 8.1 g/dL 6.8  6.8  7.3   Total Bilirubin 0.0 - 1.2 mg/dL 1.1  1.2  1.1   Alkaline Phos 38 - 126 U/L 56  72  80   AST 15 - 41 U/L 45  27  27   ALT 0 - 44 U/L 37  18  18    .No results found for: "LDH"   PATHOLOGY Surgical Pathology CASE: [id number] PATIENT: Jhordyn Hoos Bone Marrow Report     Clinical History: lymphocytosis, possible lymphoma     DIAGNOSIS:  BONE MARROW, ASPIRATE, CLOT, CORE: -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  The latter is primarily seen by flow cytometry.  The findings favor mantle cell lymphoma but correlation with cytogenetic and FISH studies is recommended.  MICROSCOPIC DESCRIPTION:  PERIPHERAL BLOOD SMEAR: The red blood cells display prominent anisocytosis with macrocytic and normocytic cells.  There is mild to moderate poikilocytosis with teardrop cells, elliptocytes.  There is moderate polychromasia.  The white blood cells are increased in number with lymphocytosis characterized by predominance of small to medium sized lymphoid cells displaying high nuclear cytoplasmic ratio, round to irregular nuclei, coarse chromatin and small to inconspicuous nucleoli. The platelets are normal in number.     RADIOGRAPHIC STUDIES: I have personally reviewed the radiological images as listed and agreed with the findings in the report. DG Chest 2 View Result Date: 07/30/2023 CLINICAL DATA:  Initial evaluation for shortness of breath. EXAM: CHEST - 2 VIEW COMPARISON:  Prior radiograph from 07/12/2023. FINDINGS: Transverse heart size stable, and remains within normal limits.  Mediastinal silhouette within normal limits. Lungs are hypoinflated with scattered basilar predominant fibrotic lung changes, consistent with history of fibrotic lung disease. Slightly increased prominence of bibasilar interstitial markings could reflect superimposed interstitial edema or possibly atypical pneumonitis. No frank consolidative airspace disease. Trace right pleural effusion. No overt pulmonary edema or pneumothorax. Visualized osseous structures and soft tissues demonstrate no acute finding. IMPRESSION: 1. Hypoinflation with scattered basilar predominant fibrotic lung changes, consistent with history of fibrotic lung disease. 2. Slightly increased prominence of bibasilar interstitial markings as compared to prior, which could reflect superimposed interstitial edema or possibly atypical pneumonitis. No consolidative airspace disease. 3. Trace right pleural effusion. Electronically Signed   By: Virgia Griffins M.D.   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 EXAM: NUCLEAR MEDICINE PET SKULL BASE TO THIGH TECHNIQUE: 7.4 mCi F-18 FDG was injected intravenously. Full-ring PET imaging was performed from the skull base to thigh after the radiotracer. CT data was obtained and used for attenuation correction and anatomic localization. Fasting blood glucose: 103 mg/dl COMPARISON:  28/41/3244 and CT abdomen/pelvis from 06/14/2023 FINDINGS: Mediastinal blood pool activity: SUV max 2.0 Liver activity: SUV max NA NECK: No significant abnormal hypermetabolic activity in this region. Incidental CT findings: None. CHEST: No significant abnormal hypermetabolic activity in this region. Incidental CT findings: Mild atheromatous vascular calcification involving the thoracic aorta. Small type 1 hiatal hernia. ABDOMEN/PELVIS: On the  CT of 08/22/2022, the right kidney appeared  completely normal aside from non-rotation. On 06/14/2023 there was extensive perinephric process with ill definition of the right kidney upper pole, substantial vascular displacement, and abnormal density extending along the right diaphragm and right retroperitoneum ascribed to grade V renal injury due to the provided history of trauma and lack of abnormal appearance in this vicinity on the prior CT abdomen. Today there is accentuated activity present throughout the large right perinephric collection, with the upper portion having a maximum SUV around 7.9 (Deauville 5) but portions along the lower renal hilum, periureteral region, and right-sided mesentery with a maximum SUV up to 26.5 (Deauville 5). There is also clearly some ascites along the inferior margin of the right hepatic lobe which is not hypermetabolic. Although a urine leak could potentially explain high activity due to leaked radiopharmaceutical in the right perinephric fluid collection and right mesentery, I am suspicious that most of the right renal, perinephric, periureteral, and right mesenteric activity in masslike appearance is actually due to lymphoma. One other possibility is that the patient did have combination of both perinephric tumor and also injury/hematoma on the 06/14/2023 exam. The total extent of abnormal perinephric, periureteral, retroperitoneal, and mesenteric activity is about 14.4 by 9.1 by 30.0 cm (volume = 2100 cm^3). This appears increased/progressive compared to 06/14/2023. Incidental CT findings: Mild ascites above the urinary bladder and along the right paracolic gutter. Atherosclerosis is present, including aortoiliac atherosclerotic disease. SKELETON: No significant abnormal hypermetabolic activity in this region. Incidental CT findings: None. IMPRESSION: 1. Increase in volume of right perinephric, periureteral, retroperitoneal, and mesenteric density which is hypermetabolic on today's exam. Given the current appearance, I  suspect that much of this actually represents lymphoma rather than blood products and hematoma, although the prior diagnosis from 06/14/2023 seems eminently reasonable given the trauma history, striking appearance, and new onset compared to 08/22/2022. Given that there is some perihepatic and pelvic ascites, the appearance could represent a combination of lymphoma and hematoma/trauma. Normally in this situation I would recommend a hematuria protocol CT with and without contrast to assess for enhancement of the perirenal tissues and also to look for urine leak. However, the patient's most recent GFR is only 32, and accordingly CT contrast administration is probably not advisable. Other possibilities to further characterize the perinephric collections include renal protocol MRI with and without contrast (to assess for tissue enhancement in phases prior to the excretory phase) or possibly nuclear medicine renal scintigraphy. 2. Small type 1 hiatal hernia. 3. Aortic atherosclerosis. Electronically Signed   By: Freida Jes M.D.   On: 07/26/2023 14:46   DG Chest 2 View Result Date: 07/12/2023 CLINICAL DATA:  Shortness of breath. EXAM: CHEST - 2 VIEW COMPARISON:  June 02, 2023. FINDINGS: Stable cardiomediastinal silhouette. No acute pulmonary disease is noted. The visualized skeletal structures are unremarkable. IMPRESSION: No active cardiopulmonary disease. Electronically Signed   By: Rosalene Colon M.D.   On: 07/12/2023 12:13    ASSESSMENT & PLAN:   83 y.o. woman with:   1.  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 Though discussed with patient that FISH was neg and so Mantle cell lymphoma not confirm. Findings more consistent with CLL.  2.  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  3) Rt perinephric mass -- ? Enlarging hematoma from previous injury vs urine leak  vs abscess vs lymphoma   4)  Hypercalcemia - ? From dehydration vs recurrent lymphoma PLAN: -Discussed lab results from 07/30/2023, in detail with the patient. CBC shows elevated WBC of 12.3 K. CMP shows elevated Bun of 58, elevated creatinine of 2.42, elevated potassium level of 5.6, elevated calcium level of 11.6, and elevated AST level of 45.  -Discussed PET scan results from 07/17/2023 in detail with the patient. Showed 1. Increase in volume of right perinephric, periureteral, retroperitoneal, and mesenteric density which is hypermetabolic on today's exam. Given the current appearance, I suspect that much of this actually represents lymphoma rather than blood products and hematoma, although the prior diagnosis from 06/14/2023 seems eminently reasonable given the trauma history, striking appearance, and new onset compared to 08/22/2022. Given that there is some perihepatic and pelvic ascites, the appearance could represent a combination of lymphoma and hematoma/trauma. Normally in this situation I would recommend a hematuria protocol CT with and without contrast to assess for enhancement of the perirenal tissues and also to look for urine leak. However, the patient's most recent GFR is only 32, and accordingly CT contrast administration is probably not advisable.   -Discussed with the patient that there many reason causing hyperkalemia and hypercalcemia.  -Discussed with the patient that hyperkalemia, hypercalcemia, elevated kidney function, and the increase volume of right perinephric, periureteral, retroperitoneal, and mesenteric density seen on PET scan needs urgent Urology/Nephrology evaluation to determine if this is a worsening perinephric hematoma vs urine leak vs lymphoma vs abscess. -will likely need rpt MRI abd/renal -if not hematoma and MRI suggestive of lymphoma -- she will need a core needle Bx by IR . The concern would be possible transformation of her CLL to a higher grade  lymphoma. -Recommend to get admitted to ED today. Pt agrees.  -Patient will be admitted to ED.  -Answered all of patient's questions.    FOLLOW-UP: Transfer to ED   The total time spent in the appointment was 40 minutes* .  All of the patient's questions were answered with apparent satisfaction. The patient knows to call the clinic with any problems, questions or concerns.   Jacquelyn Matt MD MS AAHIVMS [facility] Hematology/Oncology Physician [facility]  .*Total Encounter Time as defined by the Centers for Medicare and Medicaid Services includes, in addition to the face-to-face time of a patient visit (documented in the note above) non-face-to-face time: obtaining and reviewing outside history, ordering and reviewing medications, tests or procedures, care coordination (communications with other health care professionals or caregivers) and documentation in the medical record.   I,Param Shah,acting as a Neurosurgeon for Jacquelyn Matt, MD.,have documented all relevant documentation on the behalf of Jacquelyn Matt, MD,as directed by  Jacquelyn Matt, MD while in the presence of Jacquelyn Matt, MD.  .I have reviewed the above documentation for accuracy and completeness, and I agree with the above. .Alla Sloma Kishore Genova Kiner MD

## 2023-08-02 ENCOUNTER — Inpatient Hospital Stay (HOSPITAL_COMMUNITY)

## 2023-08-02 ENCOUNTER — Encounter (HOSPITAL_COMMUNITY): Payer: Self-pay | Admitting: Internal Medicine

## 2023-08-02 DIAGNOSIS — N179 Acute kidney failure, unspecified: Secondary | ICD-10-CM | POA: Diagnosis not present

## 2023-08-02 DIAGNOSIS — R1900 Intra-abdominal and pelvic swelling, mass and lump, unspecified site: Secondary | ICD-10-CM

## 2023-08-02 LAB — CBC
HCT: 33.2 % — ABNORMAL LOW (ref 36.0–46.0)
Hemoglobin: 10 g/dL — ABNORMAL LOW (ref 12.0–15.0)
MCH: 28.2 pg (ref 26.0–34.0)
MCHC: 30.1 g/dL (ref 30.0–36.0)
MCV: 93.5 fL (ref 80.0–100.0)
Platelets: 233 10*3/uL (ref 150–400)
RBC: 3.55 MIL/uL — ABNORMAL LOW (ref 3.87–5.11)
RDW: 12.5 % (ref 11.5–15.5)
WBC: 8.3 10*3/uL (ref 4.0–10.5)
nRBC: 0 % (ref 0.0–0.2)

## 2023-08-02 LAB — BASIC METABOLIC PANEL WITH GFR
Anion gap: 13 (ref 5–15)
BUN: 54 mg/dL — ABNORMAL HIGH (ref 8–23)
CO2: 19 mmol/L — ABNORMAL LOW (ref 22–32)
Calcium: 9.8 mg/dL (ref 8.9–10.3)
Chloride: 104 mmol/L (ref 98–111)
Creatinine, Ser: 2.04 mg/dL — ABNORMAL HIGH (ref 0.44–1.00)
GFR, Estimated: 24 mL/min — ABNORMAL LOW (ref >60)
Glucose, Bld: 77 mg/dL (ref 70–99)
Potassium: 5 mmol/L (ref 3.5–5.1)
Sodium: 136 mmol/L (ref 135–145)

## 2023-08-02 LAB — PROTIME-INR
INR: 1.3 — ABNORMAL HIGH (ref 0.8–1.2)
Prothrombin Time: 16.5 s — ABNORMAL HIGH (ref 11.4–15.2)

## 2023-08-02 LAB — GLUCOSE, CAPILLARY: Glucose-Capillary: 81 mg/dL (ref 70–99)

## 2023-08-02 MED ORDER — LORAZEPAM 2 MG/ML IJ SOLN
1.0000 mg | Freq: Once | INTRAMUSCULAR | Status: AC
  Administered 2023-08-02: 1 mg via INTRAVENOUS
  Filled 2023-08-02: qty 1

## 2023-08-02 MED ORDER — SODIUM CHLORIDE 0.9 % IV SOLN: INTRAVENOUS | Status: DC

## 2023-08-02 NOTE — Hospital Course (Addendum)
 83 y.o. 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  Found to have large right perinephric mass and acute kidney injury.

## 2023-08-02 NOTE — Progress Notes (Signed)
  Progress Note   Patient: Jacqueline Hancock:[id number] DOB: 04/29/1940 DOA: 08/01/2023     1 DOS: the patient was seen and examined on 08/02/2023 at 1:42PM      Brief hospital course: 83 y.o. 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  Found to have large right perinephric mass.        Assessment and Plan: Perinephric mass Recent PET suggest that this is FDG avid, and after reviewing imaging findings with urology, the general consensus that this is likely lymphoma. - Consult IR for biopsy    Acute renal failure Hyperkalemia Hypercalcemia Chronic kidney disease stage IIIb Baseline creatinine 1.2-1.6.  Worsened up to 2.4, improved down to 2.1 with IV fluids  Presumably the right kidney is obstructed.  Only option I assume would be PCN.   - Consult Urology - Consult IR - Continue IV fluids - Hold torsemide  - Avoid hypotension, nephrotoxins   CLL See above - Consult Oncology   Essential hypertension BP controlled --Continue amlodipine  - Hold nadolol , torsemide        Subjective: Patient feels generally uncomfortable, her abdomen is distended.  She has had no fever, confusion      Physical Exam: BP 137/64 (BP Location: Right Arm)   Pulse 73   Temp 98 F (36.7 C) (Oral)   Resp 18   SpO2 100%   Elderly adult female, lying in bed, weak and tired RRR, no murmurs, no peripheral edema Respiratory normal, lungs clear without rales or wheezes Abdomen distended but no masses appreciated, no ascites No tenderness palpation Attention normal, affect weak, generally, face symmetric, speech fluent    Data Reviewed: Discussed with urology Basic metabolic panel shows creatinine down to 2.04, potassium down to 5 CBC shows hemoglobin slightly down to 10   Family Communication: Daughter at the bedside    Disposition: Status is: Inpatient         Author: Ephriam Hashimoto, MD 08/02/2023 5:17 PM  For on call  review [email or web address].

## 2023-08-02 NOTE — Progress Notes (Addendum)
 Jacqueline Hancock   DOB:1940/07/24   QI#:[id number]      ASSESSMENT & PLAN:  Abdominal distention, worsening Right perinephric mass - Differential includes enlarging hematoma from previous injury versus tumor histologic transformation of CLL. - PET scan done 07/17/2023 showed increase in volume of right perinephric, periureteral, retroperitoneal and mesenteric density, suspicion for lymphoma versus hematoma/trauma. - MR abdomen done 08/02/2023 shows extensive abnormal soft tissue in around the right kidney.  Assessment limited due to lack of IV contrast. - Urology eval done.  No intervention at this time. - Consideration for IR tissue biopsy - Medical oncology/Dr. 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 - Was on Imbruvica  which was started January 2022. - Not on treatment  Hypercalcemia AKI - Likely multifactorial due to dehydration versus recurrent lymphoma - Patient had calcium 11.6 on 07/30/2023--> improved to 9.8 today. - Elevated creatinine and BUN with low GFR. - Avoid nephrotoxic agents - Continue to monitor CMP    Code Status Full  Subjective:  Patient seen awake alert and oriented x 3 sitting up in bed.  Reports that her abdomen is getting bigger and admits to pain right lower quadrant which is "on and off".  No other acute complaints offered.    Objective:  Vitals:   08/01/23 2338 08/02/23 0456  BP: (!) 127/57 131/61  Pulse: 72 69  Resp: 16 18  Temp: 98.4 F (36.9 C) 98.3 F (36.8 C)  SpO2: 97% 100%     Intake/Output Summary (Last 24 hours) at 08/02/2023 0949 Last data filed at 08/01/2023 1803 Gross per 24 hour  Intake 1100 ml  Output --  Net 1100 ml     PHYSICAL EXAMINATION: ECOG PERFORMANCE STATUS: 3 - Symptomatic, >50% confined to bed  Vitals:   08/01/23 2338 08/02/23 0456  BP: (!) 127/57 131/61  Pulse: 72 69  Resp: 16 18  Temp: 98.4 F (36.9 C) 98.3 F (36.8 C)  SpO2: 97%  100%   There were no vitals filed for this visit.  GENERAL: alert, no distress and comfortable SKIN: skin color, texture, turgor are normal, no rashes or significant lesions EYES: normal, conjunctiva are pink and non-injected, sclera clear OROPHARYNX: no exudate, no erythema and lips, buccal mucosa, and tongue normal  NECK: supple, thyroid  normal size, non-tender, without nodularity LYMPH: no palpable lymphadenopathy in the cervical, axillary or inguinal LUNGS: clear to auscultation and percussion with normal breathing effort HEART: regular rate & rhythm and no murmurs and no lower extremity edema ABDOMEN: + Abdominal distention MUSCULOSKELETAL: no cyanosis of digits and no clubbing  PSYCH: alert & oriented x 3 with fluent speech NEURO: no focal motor/sensory deficits   All questions were answered. The patient knows to call the clinic with any problems, questions or concerns.   The total time spent in the appointment was 40 minutes encounter with patient including review of chart and various tests results, discussions about plan of care and coordination of care plan  Jacqueline Mate, NP 08/02/2023 9:49 AM    Labs Reviewed:  Lab Results  Component Value Date   WBC 8.3 08/02/2023   HGB 10.0 (L) 08/02/2023   HCT 33.2 (L) 08/02/2023   MCV 93.5 08/02/2023   PLT 233 08/02/2023   Recent Labs    07/17/23 0912 07/30/23 1803 08/01/23 1412 08/02/23 0526  NA 138 134* 133* 136  K 4.5 5.6* 5.6* 5.0  CL 105 98 99 104  CO2 24 21*  22 19*  GLUCOSE 97 100* 86 77  BUN 26* 58* 59* 54*  CREATININE 1.58* 2.42* 2.16* 2.04*  CALCIUM 10.5* 11.6* 10.6* 9.8  GFRNONAA 32* 19* 22* 24*  PROT 6.8 6.8 6.8  --   ALBUMIN 4.1 3.4* 3.4*  --   AST 27 45* 36  --   ALT 18 37 33  --   ALKPHOS 72 56 58  --   BILITOT 1.2 1.1 1.2  --     Studies Reviewed:  CT Renal Stone Study Result Date: 08/01/2023 CLINICAL DATA:  Abdominal/flank pain, stone suspected EXAM: CT ABDOMEN AND PELVIS WITHOUT CONTRAST  TECHNIQUE: Multidetector CT imaging of the abdomen and pelvis was performed following the standard protocol without IV contrast. RADIATION DOSE REDUCTION: This exam was performed according to the departmental dose-optimization program which includes automated exposure control, adjustment of the mA and/or kV according to patient size and/or use of iterative reconstruction technique. COMPARISON:  June 14, 2023 FINDINGS: Of note, the lack of intravenous contrast limits evaluation of the solid organ parenchyma and vascularity. Lower chest: Interlobular septal thickening with patchy ground-glass opacities in the lung bases. No pleural effusion. Hepatobiliary: No mass. Decompressed gallbladder without radiopaque stones or wall thickening. No intrahepatic or extrahepatic biliary ductal dilation. Pancreas: No mass or main ductal dilation. No peripancreatic inflammation or fluid collection. None in the a he air Spleen: Normal size. No mass. Adrenals/Urinary Tract: No left adrenal mass. The right adrenal gland is not well visualized. The right renal parenchyma is not well visualized due to the lack of intravenous contrast and large amount of soft tissue attenuation filling the right renal fossa, measuring 13.7 x 9.7 x 17.8 cm (unchanged from the prior PET-CT, but larger than on the prior CT, where it measured 10.5 x 9.9 x 15.9 cm). Moderate right-sided hydronephrosis with abrupt narrowing in the region of the UPJ, likely due to extrinsic compression. The urinary bladder is completely decompressed. Stomach/Bowel: The stomach is decompressed without focal abnormality. No small bowel wall thickening or inflammation. No small bowel obstruction. The appendix was not visualized. No right lower quadrant or pericecal inflammatory changes to suggest acute appendicitis. Vascular/Lymphatic: No aortic aneurysm. Diffuse aortoiliac atherosclerosis. Numerous subcentimeter upper retroperitoneal lymph nodes measuring up to 7 mm, likely  reactive. Reproductive: Hysterectomy. No concerning adnexal mass. Other: No pneumoperitoneum. A large amount of right retroperitoneal hemorrhage extending into the pelvis with additional hemorrhage extending in throughout the left retroperitoneal space, into the root of the mesentery, and along the left retroperitoneal space. Small volume perihepatic and perisplenic ascites. Musculoskeletal: No acute fracture or destructive lesion. IMPRESSION: 1. Redemonstrated abnormal soft tissue density material filling the right renal fossa, measuring 13.7 x 9.7 x 17.8 cm (unchanged from the prior PET-CT, but larger than the prior CT from March 12, where it measured 10.5 x 9.9 x 5.9 cm). Worsening soft tissue extending throughout the right and left retroperitoneal spaces, into the mesentery, and into the pelvis. Given the appearance on the prior studies, this may represent worsening retroperitoneal hematoma or soft tissue infiltration from possible lymphoma given the patient's history. On the prior PET-CT, the increased radiotracer activity could also be related to underlying collecting system injury in the setting of renal trauma. A multiphase CT of the abdomen and pelvis (with collecting phase imaging), recommended for further characterization. 2. Moderate right-sided hydronephrosis with abrupt narrowing at the UPJ, likely due to extrinsic compression from the perinephric and retroperitoneal soft tissue. 3. Findings in the lung bases, worrisome for either a  developing bronchopneumonia, which includes viral etiologies, or early pulmonary edema. Critical Value/emergent results were called by telephone at the time of interpretation on 08/01/2023 at 3:28 pm to provider DAN FLOYD , who verbally acknowledged these results. Electronically Signed   By: Rance Burrows M.D.   On: 08/01/2023 15:53   DG Chest Port 1 View Result Date: 08/01/2023 CLINICAL DATA:  Cough and shortness of breath. EXAM: PORTABLE CHEST 1 VIEW COMPARISON:   07/30/2023 FINDINGS: Single-view of the chest. Lungs are clear except for questionable mild blunting at the right costophrenic angle. Heart size is within normal limits and stable. Trachea is midline. Degenerative changes at the bilateral AC joints. IMPRESSION: 1. Questionable blunting at the right costophrenic angle. Cannot exclude small right pleural effusion. Otherwise, no acute chest finding. Electronically Signed   By: Elene Griffes M.D.   On: 08/01/2023 14:17   DG Chest 2 View Result Date: 07/30/2023 CLINICAL DATA:  Initial evaluation for shortness of breath. EXAM: CHEST - 2 VIEW COMPARISON:  Prior radiograph from 07/12/2023. FINDINGS: Transverse heart size stable, and remains within normal limits. Mediastinal silhouette within normal limits. Lungs are hypoinflated with scattered basilar predominant fibrotic lung changes, consistent with history of fibrotic lung disease. Slightly increased prominence of bibasilar interstitial markings could reflect superimposed interstitial edema or possibly atypical pneumonitis. No frank consolidative airspace disease. Trace right pleural effusion. No overt pulmonary edema or pneumothorax. Visualized osseous structures and soft tissues demonstrate no acute finding. IMPRESSION: 1. Hypoinflation with scattered basilar predominant fibrotic lung changes, consistent with history of fibrotic lung disease. 2. Slightly increased prominence of bibasilar interstitial markings as compared to prior, which could reflect superimposed interstitial edema or possibly atypical pneumonitis. No consolidative airspace disease. 3. Trace right pleural effusion. Electronically Signed   By: Virgia Griffins M.D.   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 EXAM: NUCLEAR MEDICINE PET SKULL BASE TO THIGH TECHNIQUE: 7.4 mCi F-18 FDG was injected intravenously.  Full-ring PET imaging was performed from the skull base to thigh after the radiotracer. CT data was obtained and used for attenuation correction and anatomic localization. Fasting blood glucose: 103 mg/dl COMPARISON:  16/01/9603 and CT abdomen/pelvis from 06/14/2023 FINDINGS: Mediastinal blood pool activity: SUV max 2.0 Liver activity: SUV max NA NECK: No significant abnormal hypermetabolic activity in this region. Incidental CT findings: None. CHEST: No significant abnormal hypermetabolic activity in this region. Incidental CT findings: Mild atheromatous vascular calcification involving the thoracic aorta. Small type 1 hiatal hernia. ABDOMEN/PELVIS: On the CT of 08/22/2022, the right kidney appeared completely normal aside from non-rotation. On 06/14/2023 there was extensive perinephric process with ill definition of the right kidney upper pole, substantial vascular displacement, and abnormal density extending along the right diaphragm and right retroperitoneum ascribed to grade V renal injury due to the provided history of trauma and lack of abnormal appearance in this vicinity on the prior CT abdomen. Today there is accentuated activity present throughout the large right perinephric collection, with the upper portion having a maximum SUV around 7.9 (Deauville 5) but portions along the lower renal hilum, periureteral region, and right-sided mesentery with a maximum SUV up to 26.5 (Deauville 5). There is also clearly some ascites along the inferior margin of the right hepatic lobe which is not hypermetabolic. Although a urine leak could potentially explain high activity due to leaked radiopharmaceutical in the right perinephric fluid collection and right mesentery,  I am suspicious that most of the right renal, perinephric, periureteral, and right mesenteric activity in masslike appearance is actually due to lymphoma. One other possibility is that the patient did have combination of both perinephric tumor and also  injury/hematoma on the 06/14/2023 exam. The total extent of abnormal perinephric, periureteral, retroperitoneal, and mesenteric activity is about 14.4 by 9.1 by 30.0 cm (volume = 2100 cm^3). This appears increased/progressive compared to 06/14/2023. Incidental CT findings: Mild ascites above the urinary bladder and along the right paracolic gutter. Atherosclerosis is present, including aortoiliac atherosclerotic disease. SKELETON: No significant abnormal hypermetabolic activity in this region. Incidental CT findings: None. IMPRESSION: 1. Increase in volume of right perinephric, periureteral, retroperitoneal, and mesenteric density which is hypermetabolic on today's exam. Given the current appearance, I suspect that much of this actually represents lymphoma rather than blood products and hematoma, although the prior diagnosis from 06/14/2023 seems eminently reasonable given the trauma history, striking appearance, and new onset compared to 08/22/2022. Given that there is some perihepatic and pelvic ascites, the appearance could represent a combination of lymphoma and hematoma/trauma. Normally in this situation I would recommend a hematuria protocol CT with and without contrast to assess for enhancement of the perirenal tissues and also to look for urine leak. However, the patient's most recent GFR is only 32, and accordingly CT contrast administration is probably not advisable. Other possibilities to further characterize the perinephric collections include renal protocol MRI with and without contrast (to assess for tissue enhancement in phases prior to the excretory phase) or possibly nuclear medicine renal scintigraphy. 2. Small type 1 hiatal hernia. 3. Aortic atherosclerosis. Electronically Signed   By: Freida Jes M.D.   On: 07/26/2023 14:46   DG Chest 2 View Result Date: 07/12/2023 CLINICAL DATA:  Shortness of breath. EXAM: CHEST - 2 VIEW COMPARISON:  June 02, 2023. FINDINGS: Stable cardiomediastinal  silhouette. No acute pulmonary disease is noted. The visualized skeletal structures are unremarkable. IMPRESSION: No active cardiopulmonary disease. Electronically Signed   By: Rosalene Colon M.D.   On: 07/12/2023 12:13     ADDENDUM  .Patient was Personally and independently interviewed, examined and relevant elements of the history of present illness were reviewed in details and an assessment and plan was created. All elements of the patient's history of present illness , assessment and plan were discussed in details with Lisa Rouson NP. The above documentation reflects our combined findings assessment and plan.   Electrolytes -- hyperkalemia and hypercalcemia resolved with fluids. CT renal protocol and MRI abd -- reviewed. ABnormal soft tissue around the rt kidney. Urology input noted-- do not feel this is worsening hematoma and more mass like IR consulted for CT guided biopsy for soft tissue mass for tissue diagnosis and possible rt nephrostomy for renal decompression. Oncology will follow tissue diagnosis for definitive rx plan. Given consult for lymphoma --it would be reasonable after confirming adequacy of tissue sampling with pathology to consider starting Prednisone  60mg  daily x 7 days pending final diagnosis. Appreciate excellent hospital medicine cares.  Hana Trippett MD MS

## 2023-08-02 NOTE — Consult Note (Signed)
 Chief Complaint: Retroperitoneal mass - image guided retroperitoneal mass biopsy  Referring Provider(s): Ephriam Hashimoto  Supervising Physician: Marland Silvas  Patient Status: [facility] - In-pt  History of Present Illness: Jacqueline Hancock is a 83 y.o. female with history of hyperlipidemia, hypertension, elevated A1C, and lymphoma.  Pt presented to the emergency department 08/01/23 after a visit with Oncology where a PET scan was obtained revealing right perirenal findings and sent her to the ED for further evaluation.  Pt was incidentally found to have pneumonia and was started on antibiotics; continued perinephric abnormality was seen on PET and CT and patient was subsequently admitted.  Interventional radiology was consulted for possible image guided retroperitoneal mass biopsy.  Imaging was reviewed and approved by Dr. Marlena Sima 08/02/2023 for CT-guided retroperitoneal mass biopsy.   Patient is Full Code  Past Medical History:  Diagnosis Date   Dyspnea 2021   with exersion    Early cataracts, bilateral    Elevated hemoglobin A1c    Fibroids    GERD (gastroesophageal reflux disease)    Hyperlipidemia    Hypertension    Lymphoma (HCC)    Osteopenia     Past Surgical History:  Procedure Laterality Date   ABDOMINAL HYSTERECTOMY     APPENDECTOMY     CATARACT EXTRACTION, BILATERAL Bilateral 09/2017   Dr. 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   TONSILLECTOMY AND ADENOIDECTOMY      Allergies: Nexium [esomeprazole magnesium] and Peanut butter flavoring agent (non-screening)  Medications: Prior to Admission medications   Medication Sig Start Date End Date Taking? Authorizing Provider  amLODipine  (NORVASC ) 5 MG tablet Take 1 tablet (5 mg total) by mouth daily. 06/14/23  Yes Ngetich, Dinah C, NP  B Complex Vitamins (VITAMIN B-COMPLEX PO) Take 1 tablet by mouth daily.   Yes [provider]  Ferrous Sulfate  (IRON) 325 (65 Fe) MG TABS Take 325 mg by  mouth daily.    Yes [provider]  Loratadine (CLARITIN PO) Take by mouth daily.   Yes [provider]  nadolol  (CORGARD ) 20 MG tablet TAKE 1 TABLET BY MOUTH DAILY FOR BLOOD PRESSURE OR TREMORS Patient taking differently: Take 20 mg by mouth 2 (two) times daily. TAKE 1 TABLET BY MOUTH DAILY FOR BLOOD PRESSURE OR TREMORS 07/27/23  Yes Ngetich, Dinah C, NP  ondansetron  (ZOFRAN ) 4 MG tablet Take 1 tablet (4 mg total) by mouth every 8 (eight) hours as needed for nausea or vomiting. 06/18/23  Yes Rai, Ripudeep K, MD  polyethylene glycol (MIRALAX  / GLYCOLAX ) 17 g packet Take 17 g by mouth daily as needed for mild constipation or moderate constipation. Also available OTC 06/18/23  Yes Rai, Ripudeep K, MD  potassium chloride  (MICRO-K ) 10 MEQ CR capsule Take 2 capsules (20 mEq total) by mouth daily. 07/18/23 10/16/23 Yes Ngetich, Dinah C, NP  senna-docusate (SENOKOT-S) 8.6-50 MG tablet Take 2 tablets by mouth at bedtime. For constipation 06/18/23  Yes Rai, Ripudeep K, MD  torsemide  (DEMADEX ) 20 MG tablet TAKE 1 TABLET BY MOUTH EVERY DAY AS NEEDED FOR LEG EDEMA OR ABRUPT WEIGHT GAIN 07/06/23  Yes Ngetich, Dinah C, NP  traMADol  (ULTRAM ) 50 MG tablet Take 1 tablet (50 mg total) by mouth every 8 (eight) hours as needed for moderate pain (pain score 4-6) or severe pain (pain score 7-10) (pain). 06/18/23  Yes Rai, Ripudeep K, MD  gabapentin  (NEURONTIN ) 100 MG capsule Take 1 capsule 3 x /day for Neuropathy Pain Patient  not taking: Reported on 01/25/2020 08/02/19 01/25/20  Vangie Genet, MD     Family History  Problem Relation Age of Onset   Leukemia Mother    Hypertension Father    Diabetes Sister    Heart disease Brother    Diabetes Brother    Diabetes Brother    Hypertension Son     Social History   Socioeconomic History   Marital status: Widowed    Spouse name: Not on file   Number of children: Not on file   Years of education: Not on file   Highest education level: Not on file   Occupational History   Not on file  Tobacco Use   Smoking status: Never   Smokeless tobacco: Never  Vaping Use   Vaping status: Never Used  Substance and Sexual Activity   Alcohol use: No   Drug use: Never   Sexual activity: Not on file  Other Topics Concern   Not on file  Social History Narrative   Not on file   Social Drivers of Health   Financial Resource Strain: Not on file  Food Insecurity: No Food Insecurity (08/02/2023)   Hunger Vital Sign    Worried About Running Out of Food in the Last Year: Never true    Ran Out of Food in the Last Year: Never true  Transportation Needs: No Transportation Needs (08/02/2023)   PRAPARE - Administrator, Civil Service (Medical): No    Lack of Transportation (Non-Medical): No  Physical Activity: Not on file  Stress: Not on file  Social Connections: Unknown (08/02/2023)   Social Connection and Isolation Panel [NHANES]    Frequency of Communication with Friends and Family: More than three times a week    Frequency of Social Gatherings with Friends and Family: Twice a week    Attends Religious Services: More than 4 times per year    Active Member of [employer] or Organizations: Yes    Attends Engineer, structural: More than 4 times per year    Marital Status: Patient unable to answer     Review of Systems: A 12 point ROS discussed and pertinent positives are indicated in the HPI above.  All other systems are negative.  Review of Systems  Constitutional:  Negative for chills, fatigue and fever.  Respiratory:  Negative for cough, shortness of breath and wheezing.   Gastrointestinal:  Negative for diarrhea, nausea and vomiting.  Neurological:  Negative for dizziness and headaches.  Psychiatric/Behavioral:  Negative for agitation, behavioral problems and confusion.     Vital Signs: BP 131/61 (BP Location: Right Arm)   Pulse 69   Temp 98.3 F (36.8 C)   Resp 18   SpO2 100%   Advance Care Plan: The advanced care  place/surrogate decision maker was discussed at the time of visit and the patient did not wish to discuss or was not able to name a surrogate decision maker or provide an advance care plan.  Physical Exam Vitals reviewed.  Constitutional:      Appearance: She is well-developed.  HENT:     Head: Atraumatic.     Mouth/Throat:     Mouth: Mucous membranes are moist.  Cardiovascular:     Rate and Rhythm: Normal rate and regular rhythm.     Heart sounds: No murmur heard. Pulmonary:     Effort: Pulmonary effort is normal.     Breath sounds: Normal breath sounds.  Abdominal:     General: Bowel sounds are  normal.     Palpations: Abdomen is soft.  Musculoskeletal:        General: Normal range of motion.  Skin:    General: Skin is warm.  Neurological:     Mental Status: She is alert and oriented to person, place, and time.  Psychiatric:        Mood and Affect: Mood normal.        Behavior: Behavior normal.     Imaging: MR ABDOMEN WO CONTRAST Result Date: 08/02/2023 CLINICAL DATA:  Abnormal soft tissue in the right renal fossa on prior imaging. EXAM: MRI ABDOMEN WITHOUT CONTRAST TECHNIQUE: Multiplanar multisequence MR imaging was performed without the administration of intravenous contrast. COMPARISON:  CT stone study 08/01/2023. PET-CT 07/17/2023. Abdomen pelvis CT with contrast 06/14/2023. FINDINGS: Lower chest: No acute findings Hepatobiliary: No focal abnormality in the liver on noncontrast imaging. Gallbladder is nondistended with multiple tiny gallstones evident. No intrahepatic or extrahepatic biliary dilation. Pancreas: No focal mass lesion. No dilatation of the main duct. No intraparenchymal cyst. No peripancreatic edema. Spleen:  No splenomegaly. No suspicious focal mass lesion. Adrenals/Urinary Tract: Left adrenal gland unremarkable. Right adrenal gland not discretely visible. Mild fullness left intrarenal collecting system. Extensive abnormal soft tissue is seen in and around the  right kidney and involving the right retroperitoneal space. 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 Punctate areas of signal void in the region of the right renal pelvis may be tiny calcified gallstones are foci of collecting system gas. This tissue shows marked diffusion restriction and tracks anteriorly to encase the descending and transverse duodenum and extends into the central abdominal mesentery. Inferior extent in the retroperitoneal space tracks down to the pelvis and right greater than left pelvic sidewall although this region is been incompletely visualized on this abdomen MRI. Lack of intravenous contrast hinders assessment. Stomach/Bowel: Tiny hiatal hernia. Stomach is nondilated. Duodenum is nondilated. No small bowel or colonic dilatation within the visualized abdomen. Vascular/Lymphatic: No abdominal aortic aneurysm. Abnormal soft tissue in the right retroperitoneum generates mass-effect on the IVC with anterior displacement in the upper abdomen although signal void in the lumen of the IVC suggests that it remains patent. Other: Areas of free fluid are seen at the inferior tip of the liver and right paracolic gutter. Scattered tiny fluid collections are seen in the retroperitoneal space. Diffuse mesenteric and body wall edema evident. Musculoskeletal: No overtly suspicious marrow signal abnormality. IMPRESSION: 1. Extensive abnormal soft tissue in and around the right kidney and involving the right retroperitoneal space. This tissue tracks anteriorly to encase the descending and transverse duodenum and extends into the central abdominal mesentery. Inferior extent in the retroperitoneal space tracks down to the pelvis and right greater than left pelvic sidewalls although this region has been incompletely visualized on this abdomen MRI. Lack of intravenous contrast hinders assessment. As on the recent PET-CT, given that most of the abnormal  tissue shows restricted diffusion, this is likely related to lymphoma given the patient's history although, again, assessment is limited by lack of intravenous contrast material. Tissue sampling would likely prove helpful. 2. 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 Although there does not appear to be a large retroperitoneal fluid collection, underlying component of urine leak is not excluded. Punctate areas of signal void in the region of the right renal pelvis may be tiny calcified stones or foci of collecting system gas. 3. Small volume intraperitoneal free fluid. 4. Cholelithiasis.  5. Tiny hiatal hernia. 6. Diffuse mesenteric and body wall edema. Electronically Signed   By: Donnal Fusi M.D.   On: 08/02/2023 11:42   CT Renal Stone Study Result Date: 08/01/2023 CLINICAL DATA:  Abdominal/flank pain, stone suspected EXAM: CT ABDOMEN AND PELVIS WITHOUT CONTRAST TECHNIQUE: Multidetector CT imaging of the abdomen and pelvis was performed following the standard protocol without IV contrast. RADIATION DOSE REDUCTION: This exam was performed according to the departmental dose-optimization program which includes automated exposure control, adjustment of the mA and/or kV according to patient size and/or use of iterative reconstruction technique. COMPARISON:  June 14, 2023 FINDINGS: Of note, the lack of intravenous contrast limits evaluation of the solid organ parenchyma and vascularity. Lower chest: Interlobular septal thickening with patchy ground-glass opacities in the lung bases. No pleural effusion. Hepatobiliary: No mass. Decompressed gallbladder without radiopaque stones or wall thickening. No intrahepatic or extrahepatic biliary ductal dilation. Pancreas: No mass or main ductal dilation. No peripancreatic inflammation or fluid collection. None in the a he air Spleen: Normal size. No mass. Adrenals/Urinary Tract: No left adrenal mass. The right  adrenal gland is not well visualized. The right renal parenchyma is not well visualized due to the lack of intravenous contrast and large amount of soft tissue attenuation filling the right renal fossa, measuring 13.7 x 9.7 x 17.8 cm (unchanged from the prior PET-CT, but larger than on the prior CT, where it measured 10.5 x 9.9 x 15.9 cm). Moderate right-sided hydronephrosis with abrupt narrowing in the region of the UPJ, likely due to extrinsic compression. The urinary bladder is completely decompressed. Stomach/Bowel: The stomach is decompressed without focal abnormality. No small bowel wall thickening or inflammation. No small bowel obstruction. The appendix was not visualized. No right lower quadrant or pericecal inflammatory changes to suggest acute appendicitis. Vascular/Lymphatic: No aortic aneurysm. Diffuse aortoiliac atherosclerosis. Numerous subcentimeter upper retroperitoneal lymph nodes measuring up to 7 mm, likely reactive. Reproductive: Hysterectomy. No concerning adnexal mass. Other: No pneumoperitoneum. A large amount of right retroperitoneal hemorrhage extending into the pelvis with additional hemorrhage extending in throughout the left retroperitoneal space, into the root of the mesentery, and along the left retroperitoneal space. Small volume perihepatic and perisplenic ascites. Musculoskeletal: No acute fracture or destructive lesion. IMPRESSION: 1. Redemonstrated abnormal soft tissue density material filling the right renal fossa, measuring 13.7 x 9.7 x 17.8 cm (unchanged from the prior PET-CT, but larger than the prior CT from March 12, where it measured 10.5 x 9.9 x 5.9 cm). Worsening soft tissue extending throughout the right and left retroperitoneal spaces, into the mesentery, and into the pelvis. Given the appearance on the prior studies, this may represent worsening retroperitoneal hematoma or soft tissue infiltration from possible lymphoma given the patient's history. On the prior PET-CT,  the increased radiotracer activity could also be related to underlying collecting system injury in the setting of renal trauma. A multiphase CT of the abdomen and pelvis (with collecting phase imaging), recommended for further characterization. 2. Moderate right-sided hydronephrosis with abrupt narrowing at the UPJ, likely due to extrinsic compression from the perinephric and retroperitoneal soft tissue. 3. Findings in the lung bases, worrisome for either a developing bronchopneumonia, which includes viral etiologies, or early pulmonary edema. Critical Value/emergent results were called by telephone at the time of interpretation on 08/01/2023 at 3:28 pm to provider DAN FLOYD , who verbally acknowledged these results. Electronically Signed   By: Rance Burrows M.D.   On: 08/01/2023 15:53   DG Chest [facility] 1 View Result  Date: 08/01/2023 CLINICAL DATA:  Cough and shortness of breath. EXAM: PORTABLE CHEST 1 VIEW COMPARISON:  07/30/2023 FINDINGS: Single-view of the chest. Lungs are clear except for questionable mild blunting at the right costophrenic angle. Heart size is within normal limits and stable. Trachea is midline. Degenerative changes at the bilateral AC joints. IMPRESSION: 1. Questionable blunting at the right costophrenic angle. Cannot exclude small right pleural effusion. Otherwise, no acute chest finding. Electronically Signed   By: Elene Griffes M.D.   On: 08/01/2023 14:17   DG Chest 2 View Result Date: 07/30/2023 CLINICAL DATA:  Initial evaluation for shortness of breath. EXAM: CHEST - 2 VIEW COMPARISON:  Prior radiograph from 07/12/2023. FINDINGS: Transverse heart size stable, and remains within normal limits. Mediastinal silhouette within normal limits. Lungs are hypoinflated with scattered basilar predominant fibrotic lung changes, consistent with history of fibrotic lung disease. Slightly increased prominence of bibasilar interstitial markings could reflect superimposed interstitial edema or possibly  atypical pneumonitis. No frank consolidative airspace disease. Trace right pleural effusion. No overt pulmonary edema or pneumothorax. Visualized osseous structures and soft tissues demonstrate no acute finding. IMPRESSION: 1. Hypoinflation with scattered basilar predominant fibrotic lung changes, consistent with history of fibrotic lung disease. 2. Slightly increased prominence of bibasilar interstitial markings as compared to prior, which could reflect superimposed interstitial edema or possibly atypical pneumonitis. No consolidative airspace disease. 3. Trace right pleural effusion. Electronically Signed   By: Virgia Griffins M.D.   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 EXAM: NUCLEAR MEDICINE PET SKULL BASE TO THIGH TECHNIQUE: 7.4 mCi F-18 FDG was injected intravenously. Full-ring PET imaging was performed from the skull base to thigh after the radiotracer. CT data was obtained and used for attenuation correction and anatomic localization. Fasting blood glucose: 103 mg/dl COMPARISON:  19/14/7829 and CT abdomen/pelvis from 06/14/2023 FINDINGS: Mediastinal blood pool activity: SUV max 2.0 Liver activity: SUV max NA NECK: No significant abnormal hypermetabolic activity in this region. Incidental CT findings: None. CHEST: No significant abnormal hypermetabolic activity in this region. Incidental CT findings: Mild atheromatous vascular calcification involving the thoracic aorta. Small type 1 hiatal hernia. ABDOMEN/PELVIS: On the CT of 08/22/2022, the right kidney appeared completely normal aside from non-rotation. On 06/14/2023 there was extensive perinephric process with ill definition of the right kidney upper pole, substantial vascular displacement, and abnormal density extending along the right diaphragm and right retroperitoneum ascribed to grade V renal injury due  to the provided history of trauma and lack of abnormal appearance in this vicinity on the prior CT abdomen. Today there is accentuated activity present throughout the large right perinephric collection, with the upper portion having a maximum SUV around 7.9 (Deauville 5) but portions along the lower renal hilum, periureteral region, and right-sided mesentery with a maximum SUV up to 26.5 (Deauville 5). There is also clearly some ascites along the inferior margin of the right hepatic lobe which is not hypermetabolic. Although a urine leak could potentially explain high activity due to leaked radiopharmaceutical in the right perinephric fluid collection and right mesentery, I am suspicious that most of the right renal, perinephric, periureteral, and right mesenteric activity in masslike appearance is actually due to lymphoma. One other possibility is that the patient did have combination of both perinephric tumor and also injury/hematoma on the 06/14/2023 exam. The total extent of abnormal perinephric, periureteral, retroperitoneal, and mesenteric activity is about 14.4 by  9.1 by 30.0 cm (volume = 2100 cm^3). This appears increased/progressive compared to 06/14/2023. Incidental CT findings: Mild ascites above the urinary bladder and along the right paracolic gutter. Atherosclerosis is present, including aortoiliac atherosclerotic disease. SKELETON: No significant abnormal hypermetabolic activity in this region. Incidental CT findings: None. IMPRESSION: 1. Increase in volume of right perinephric, periureteral, retroperitoneal, and mesenteric density which is hypermetabolic on today's exam. Given the current appearance, I suspect that much of this actually represents lymphoma rather than blood products and hematoma, although the prior diagnosis from 06/14/2023 seems eminently reasonable given the trauma history, striking appearance, and new onset compared to 08/22/2022. Given that there is some perihepatic and pelvic  ascites, the appearance could represent a combination of lymphoma and hematoma/trauma. Normally in this situation I would recommend a hematuria protocol CT with and without contrast to assess for enhancement of the perirenal tissues and also to look for urine leak. However, the patient's most recent GFR is only 32, and accordingly CT contrast administration is probably not advisable. Other possibilities to further characterize the perinephric collections include renal protocol MRI with and without contrast (to assess for tissue enhancement in phases prior to the excretory phase) or possibly nuclear medicine renal scintigraphy. 2. Small type 1 hiatal hernia. 3. Aortic atherosclerosis. Electronically Signed   By: Freida Jes M.D.   On: 07/26/2023 14:46   DG Chest 2 View Result Date: 07/12/2023 CLINICAL DATA:  Shortness of breath. EXAM: CHEST - 2 VIEW COMPARISON:  June 02, 2023. FINDINGS: Stable cardiomediastinal silhouette. No acute pulmonary disease is noted. The visualized skeletal structures are unremarkable. IMPRESSION: No active cardiopulmonary disease. Electronically Signed   By: Rosalene Colon M.D.   On: 07/12/2023 12:13    Labs:  CBC: Recent Labs    07/17/23 0912 07/30/23 1803 08/01/23 1412 08/02/23 0526  WBC 7.7 12.3* 9.9 8.3  HGB 10.8* 12.0 10.9* 10.0*  HCT 32.9* 36.6 34.7* 33.2*  PLT 226 324 277 233    COAGS: No results for input(s): "INR", "APTT" in the last 8760 hours.  BMP: Recent Labs    07/17/23 0912 07/30/23 1803 08/01/23 1412 08/02/23 0526  NA 138 134* 133* 136  K 4.5 5.6* 5.6* 5.0  CL 105 98 99 104  CO2 24 21* 22 19*  GLUCOSE 97 100* 86 77  BUN 26* 58* 59* 54*  CALCIUM 10.5* 11.6* 10.6* 9.8  CREATININE 1.58* 2.42* 2.16* 2.04*  GFRNONAA 32* 19* 22* 24*    LIVER FUNCTION TESTS: Recent Labs    07/12/23 0857 07/17/23 0912 07/30/23 1803 08/01/23 1412  BILITOT 1.1 1.2 1.1 1.2  AST 27 27 45* 36  ALT 18 18 37 33  ALKPHOS 80 72 56 58  PROT 7.3  6.8 6.8 6.8  ALBUMIN 4.3 4.1 3.4* 3.4*    TUMOR MARKERS: No results for input(s): "AFPTM", "CEA", "CA199", "CHROMGRNA" in the last 8760 hours.  Assessment and Plan:  Patient is with retroperitoneal mass scheduled for image guided retroperitoneal mass biopsy.  Imaging was reviewed and approved by Dr. Marlena Sima 08/02/2023.  Risks and benefits of image guided retroperitoneal mass biopsy was discussed with the patient and/or patient's family including, but not limited to bleeding, infection, damage to adjacent structures or low yield requiring additional tests.  All of the questions were answered and there is agreement to proceed.  Consent signed and in chart.  Thank you for allowing our service to participate in Jacqueline Hancock 's care.  Electronically Signed: Pasty Bongo, PA-C  08/02/2023, 1:14 PM    I spent a total of 40 Minutes    in face to face in clinical consultation, greater than 50% of which was counseling/coordinating care for image guided retroperitoneal mass biopsy.

## 2023-08-02 NOTE — Consult Note (Signed)
 Urology Consult Note   Requesting Attending Physician:  Ephriam Hashimoto, * Service Providing Consult: Urology  Consulting Attending: Dr. Claretta Croft   Reason for Consult:  right perinephric renal mass, undifferentiated  HPI: Jacqueline Hancock is seen in consultation for reasons noted above at the request of Ephriam Hashimoto, *.  Patient is an 83 year old female we have seen in hospital previously.  PMH significant for non-Hodgkin's lymphoma, hemolytic anemia, and CKD.  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  She was experiencing undifferentiated pain at that time and had been sent to our practice for assessment.  It was thought at the time that findings were unlikely to be related.  She was seen again by Dr. Cathi Cluster on March 2025 after a fall resulting in a grade 5 renal laceration of the right side.  No intervention was indicated at the time and she was discharged home with stable hemoglobin.  She recently underwent a PET scan on 07/26/2023 which showed an increased volume of a hypermetabolic right side perinephric, periureteral, retroperitoneal, and mesenteric density.  At the direction of her oncologist she has arrived to the hospital for treatment of her AKI and further workup.  On my arrival patient was alert, oriented, and resting in bed.  She seemed to have a minimal understanding of any of her medical conditions.  Oncology APP joined us  and we briefly discussed the case and plan at this point.  She had no questions at this time.  ------------------  Assessment:  83 y.o. 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 Interval increase in size compared to 06/2023 imaging.   Initial questions were: is there  surgical indication, concern for bleeding, or evidence of renal cell carcinoma.  Unfortunately her renal function prohibits us  from using contrast which would be helpful.  On independent review with Dr. Claretta Croft this does not appear to be renal cell carcinoma or evidence of new bleeding.  I understand that oncology is concerned that this represents lymphoma and I would encourage them to have interventional radiology assist with tissue biopsy as soon as possible.  Trend labs. Scr baseline around 1.5  Moderate right-sided hydronephrosis likely secondary to extrinsic compression.  Stent failure in this context is as high as 50% and if renal decompression is necessary for oncology treatment plan, would recommend percutaneous nephrostomy tube placement.   Please call with questions or if we can be of further assistance.  Case and plan discussed with Dr. Claretta Croft  Past Medical History: Past Medical History:  Diagnosis Date   Dyspnea 2021   with exersion    Early cataracts, bilateral    Elevated hemoglobin A1c    Fibroids    GERD (gastroesophageal reflux disease)    Hyperlipidemia    Hypertension    Lymphoma (HCC)    Osteopenia     Past Surgical History:  Past Surgical History:  Procedure Laterality Date   ABDOMINAL HYSTERECTOMY     APPENDECTOMY     CATARACT EXTRACTION, BILATERAL Bilateral 09/2017   Dr. 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   TONSILLECTOMY AND ADENOIDECTOMY      Medication: Current Facility-Administered Medications  Medication Dose Route Frequency Provider Last Rate Last Admin   0.9 %  sodium chloride  infusion   Intravenous Continuous Jannette Mend, Mir M, MD 100 mL/hr at 08/02/23 0551 New Bag at 08/02/23 0551   acetaminophen  (TYLENOL ) tablet 650 mg  650 mg Oral Q6H PRN Jannette Mend, Mir M, MD       Or   acetaminophen  (TYLENOL ) suppository 650 mg  650 mg Rectal Q6H PRN Jannette Mend, Mir M, MD       albuterol (PROVENTIL) (2.5 MG/3ML) 0.083% nebulizer  solution 2.5 mg  2.5 mg Nebulization Q2H PRN Jannette Mend, Mir M, MD       amLODipine  (NORVASC ) tablet 5 mg  5 mg Oral Daily Jannette Mend, Mir M, MD   5 mg at 08/02/23 1610   ondansetron  (ZOFRAN ) tablet 4 mg  4 mg Oral Q6H PRN Jannette Mend, Mir M, MD       Or   ondansetron  (ZOFRAN ) injection 4 mg  4 mg Intravenous Q6H PRN Jannette Mend, Mir M, MD       polyethylene glycol (MIRALAX  / GLYCOLAX ) packet 17 g  17 g Oral Daily PRN Jannette Mend, Mir M, MD       senna-docusate (Senokot-S) tablet 2 tablet  2 tablet Oral BID Gaylin Ke, MD   2 tablet at 08/02/23 9604    Allergies: Allergies  Allergen Reactions   Nexium [Esomeprazole Magnesium] Other (See Comments)    Headache   Peanut Butter Flavoring Agent (Non-Screening) Itching    Social History: Social History   Tobacco Use   Smoking status: Never   Smokeless tobacco: Never  Vaping Use   Vaping status: Never Used  Substance Use Topics   Alcohol use: No   Drug use: Never    Family History Family History  Problem Relation Age of Onset   Leukemia Mother    Hypertension Father    Diabetes Sister    Heart disease Brother    Diabetes Brother    Diabetes Brother    Hypertension Son     Review of Systems  Genitourinary:  Negative for dysuria, flank pain, frequency, hematuria and urgency.     Objective   Vital signs in last 24 hours: BP 131/61 (BP Location: Right Arm)   Pulse 69   Temp 98.3 F (36.8 C)   Resp 18   SpO2 100%   Physical Exam General: A&O, resting, appropriate HEENT: [place]/AT Pulmonary: Normal work of breathing Cardiovascular: no cyanosis Abdomen: protuberant   Most Recent Labs: Lab Results  Component Value Date   WBC 8.3 08/02/2023   HGB 10.0 (L) 08/02/2023   HCT 33.2 (L) 08/02/2023   PLT 233 08/02/2023    Lab Results  Component Value Date   NA 136 08/02/2023   K 5.0 08/02/2023   CL 104 08/02/2023   CO2 19 (L) 08/02/2023   BUN 54 (H) 08/02/2023   CREATININE 2.04 (H) 08/02/2023   CALCIUM 9.8  08/02/2023   MG 2.1 02/14/2023   PHOS 2.5 06/18/2023    Lab Results  Component Value Date   INR 1.2 03/05/2020     Urine Culture: @[id number] (laburin,org,[id number])@   IMAGING: CT Renal Stone Study Result Date: 08/01/2023 CLINICAL DATA:  Abdominal/flank pain, stone suspected EXAM: CT ABDOMEN AND PELVIS WITHOUT CONTRAST TECHNIQUE: Multidetector CT imaging of the abdomen and pelvis was performed following the standard protocol without IV contrast. RADIATION DOSE REDUCTION: This exam was performed according to the departmental dose-optimization program which includes automated exposure control, adjustment of the mA and/or kV according to patient size and/or use of iterative reconstruction technique. COMPARISON:  June 14, 2023 FINDINGS: Of note,  the lack of intravenous contrast limits evaluation of the solid organ parenchyma and vascularity. Lower chest: Interlobular septal thickening with patchy ground-glass opacities in the lung bases. No pleural effusion. Hepatobiliary: No mass. Decompressed gallbladder without radiopaque stones or wall thickening. No intrahepatic or extrahepatic biliary ductal dilation. Pancreas: No mass or main ductal dilation. No peripancreatic inflammation or fluid collection. None in the a he air Spleen: Normal size. No mass. Adrenals/Urinary Tract: No left adrenal mass. The right adrenal gland is not well visualized. The right renal parenchyma is not well visualized due to the lack of intravenous contrast and large amount of soft tissue attenuation filling the right renal fossa, measuring 13.7 x 9.7 x 17.8 cm (unchanged from the prior PET-CT, but larger than on the prior CT, where it measured 10.5 x 9.9 x 15.9 cm). Moderate right-sided hydronephrosis with abrupt narrowing in the region of the UPJ, likely due to extrinsic compression. The urinary bladder is completely decompressed. Stomach/Bowel: The stomach is decompressed without focal abnormality. No small bowel wall  thickening or inflammation. No small bowel obstruction. The appendix was not visualized. No right lower quadrant or pericecal inflammatory changes to suggest acute appendicitis. Vascular/Lymphatic: No aortic aneurysm. Diffuse aortoiliac atherosclerosis. Numerous subcentimeter upper retroperitoneal lymph nodes measuring up to 7 mm, likely reactive. Reproductive: Hysterectomy. No concerning adnexal mass. Other: No pneumoperitoneum. A large amount of right retroperitoneal hemorrhage extending into the pelvis with additional hemorrhage extending in throughout the left retroperitoneal space, into the root of the mesentery, and along the left retroperitoneal space. Small volume perihepatic and perisplenic ascites. Musculoskeletal: No acute fracture or destructive lesion. IMPRESSION: 1. Redemonstrated abnormal soft tissue density material filling the right renal fossa, measuring 13.7 x 9.7 x 17.8 cm (unchanged from the prior PET-CT, but larger than the prior CT from March 12, where it measured 10.5 x 9.9 x 5.9 cm). Worsening soft tissue extending throughout the right and left retroperitoneal spaces, into the mesentery, and into the pelvis. Given the appearance on the prior studies, this may represent worsening retroperitoneal hematoma or soft tissue infiltration from possible lymphoma given the patient's history. On the prior PET-CT, the increased radiotracer activity could also be related to underlying collecting system injury in the setting of renal trauma. A multiphase CT of the abdomen and pelvis (with collecting phase imaging), recommended for further characterization. 2. Moderate right-sided hydronephrosis with abrupt narrowing at the UPJ, likely due to extrinsic compression from the perinephric and retroperitoneal soft tissue. 3. Findings in the lung bases, worrisome for either a developing bronchopneumonia, which includes viral etiologies, or early pulmonary edema. Critical Value/emergent results were called by  telephone at the time of interpretation on 08/01/2023 at 3:28 pm to provider DAN FLOYD , who verbally acknowledged these results. Electronically Signed   By: Rance Burrows M.D.   On: 08/01/2023 15:53   DG Chest Port 1 View Result Date: 08/01/2023 CLINICAL DATA:  Cough and shortness of breath. EXAM: PORTABLE CHEST 1 VIEW COMPARISON:  07/30/2023 FINDINGS: Single-view of the chest. Lungs are clear except for questionable mild blunting at the right costophrenic angle. Heart size is within normal limits and stable. Trachea is midline. Degenerative changes at the bilateral AC joints. IMPRESSION: 1. Questionable blunting at the right costophrenic angle. Cannot exclude small right pleural effusion. Otherwise, no acute chest finding. Electronically Signed   By: Elene Griffes M.D.   On: 08/01/2023 14:17    ------  Alla Ar, NP Pager: [phone]   Please contact the urology consult pager with any  further questions/concerns.

## 2023-08-02 NOTE — Progress Notes (Signed)
   08/02/23 1113  TOC Brief Assessment  Insurance and Status Reviewed  Patient has primary care physician Yes  Home environment has been reviewed single family home  Prior level of function: independent  Prior/Current Home Services No current home services  Social Drivers of Health Review SDOH reviewed no interventions necessary  Readmission risk has been reviewed Yes  Transition of care needs no transition of care needs at this time   CSW met with pt at bedside to discuss SDOH risk, specifically utilities. Pt reports that struggling to pay for utilities is a past issue. Pt reports she is able to pay for her utilities currently. No further TOC needs at this time.  [place], LCSW 08/02/2023 11:15 AM

## 2023-08-02 NOTE — Plan of Care (Signed)
?  Problem: Clinical Measurements: ?Goal: Ability to maintain clinical measurements within normal limits will improve ?Outcome: Progressing ?Goal: Will remain free from infection ?Outcome: Progressing ?  ?Problem: Nutrition: ?Goal: Adequate nutrition will be maintained ?Outcome: Progressing ?  ?Problem: Elimination: ?Goal: Will not experience complications related to bowel motility ?Outcome: Progressing ?Goal: Will not experience complications related to urinary retention ?Outcome: Progressing ?  ?Problem: Safety: ?Goal: Ability to remain free from injury will improve ?Outcome: Progressing ?  ?Problem: Skin Integrity: ?Goal: Risk for impaired skin integrity will decrease ?Outcome: Progressing ?  ?

## 2023-08-02 NOTE — Plan of Care (Signed)
  Problem: Nutrition: Goal: Adequate nutrition will be maintained Outcome: Progressing   Problem: Safety: Goal: Ability to remain free from injury will improve Outcome: Progressing   Problem: Clinical Measurements: Goal: Will remain free from infection Outcome: Progressing

## 2023-08-03 DIAGNOSIS — N179 Acute kidney failure, unspecified: Secondary | ICD-10-CM | POA: Diagnosis not present

## 2023-08-03 DIAGNOSIS — E875 Hyperkalemia: Secondary | ICD-10-CM | POA: Insufficient documentation

## 2023-08-03 DIAGNOSIS — R1900 Intra-abdominal and pelvic swelling, mass and lump, unspecified site: Secondary | ICD-10-CM

## 2023-08-03 LAB — URINALYSIS, COMPLETE (UACMP) WITH MICROSCOPIC
Bacteria, UA: NONE SEEN
Bilirubin Urine: NEGATIVE
Glucose, UA: NEGATIVE mg/dL
Hgb urine dipstick: NEGATIVE
Ketones, ur: NEGATIVE mg/dL
Nitrite: NEGATIVE
Protein, ur: NEGATIVE mg/dL
Specific Gravity, Urine: 1.016 (ref 1.005–1.030)
pH: 5 (ref 5.0–8.0)

## 2023-08-03 LAB — COMPREHENSIVE METABOLIC PANEL WITH GFR
ALT: 23 U/L (ref 0–44)
AST: 27 U/L (ref 15–41)
Albumin: 2.6 g/dL — ABNORMAL LOW (ref 3.5–5.0)
Alkaline Phosphatase: 50 U/L (ref 38–126)
Anion gap: 11 (ref 5–15)
BUN: 46 mg/dL — ABNORMAL HIGH (ref 8–23)
CO2: 17 mmol/L — ABNORMAL LOW (ref 22–32)
Calcium: 9.8 mg/dL (ref 8.9–10.3)
Chloride: 107 mmol/L (ref 98–111)
Creatinine, Ser: 2.03 mg/dL — ABNORMAL HIGH (ref 0.44–1.00)
GFR, Estimated: 24 mL/min — ABNORMAL LOW (ref >60)
Glucose, Bld: 101 mg/dL — ABNORMAL HIGH (ref 70–99)
Potassium: 4.4 mmol/L (ref 3.5–5.1)
Sodium: 135 mmol/L (ref 135–145)
Total Bilirubin: 0.6 mg/dL (ref 0.0–1.2)
Total Protein: 5.8 g/dL — ABNORMAL LOW (ref 6.5–8.1)

## 2023-08-03 LAB — CBC
HCT: 32.3 % — ABNORMAL LOW (ref 36.0–46.0)
Hemoglobin: 9.7 g/dL — ABNORMAL LOW (ref 12.0–15.0)
MCH: 28.4 pg (ref 26.0–34.0)
MCHC: 30 g/dL (ref 30.0–36.0)
MCV: 94.4 fL (ref 80.0–100.0)
Platelets: 233 10*3/uL (ref 150–400)
RBC: 3.42 MIL/uL — ABNORMAL LOW (ref 3.87–5.11)
RDW: 12.8 % (ref 11.5–15.5)
WBC: 8.6 10*3/uL (ref 4.0–10.5)
nRBC: 0 % (ref 0.0–0.2)

## 2023-08-03 LAB — CREATININE, URINE, RANDOM: Creatinine, Urine: 136 mg/dL

## 2023-08-03 LAB — SODIUM, URINE, RANDOM: Sodium, Ur: 96 mmol/L

## 2023-08-03 MED ORDER — NADOLOL 20 MG PO TABS
20.0000 mg | ORAL_TABLET | Freq: Every day | ORAL | Status: DC
  Administered 2023-08-03 – 2023-08-05 (×3): 20 mg via ORAL
  Filled 2023-08-03 (×3): qty 1

## 2023-08-03 NOTE — Plan of Care (Signed)
  Problem: Education: Goal: Knowledge of General Education information will improve Description: Including pain rating scale, medication(s)/side effects and non-pharmacologic comfort measures Outcome: Progressing   Problem: Activity: Goal: Risk for activity intolerance will decrease Outcome: Progressing   Problem: Elimination: Goal: Will not experience complications related to urinary retention Outcome: Progressing   Problem: Pain Managment: Goal: General experience of comfort will improve and/or be controlled Outcome: Progressing   Problem: Skin Integrity: Goal: Risk for impaired skin integrity will decrease Outcome: Progressing

## 2023-08-03 NOTE — Assessment & Plan Note (Signed)
 Recent PET suggest that this is FDG avid, and after reviewing imaging findings with urology, the general consensus that this is likely lymphoma. - Consult IR for biopsy - Outpatient Oncology follow up

## 2023-08-03 NOTE — Assessment & Plan Note (Signed)
-  Consult Oncology

## 2023-08-03 NOTE — Progress Notes (Addendum)
 Patient ID: Jacqueline Hancock, female   DOB: [birth year], 83 y.o.   MRN: [id number] Patient's CT-guided retroperitoneal mass biopsy has been rescheduled for 5/2.  Case also reviewed with urology earlier today.  There are currently no plans for nephrostomy placement.  Nurse updated.

## 2023-08-03 NOTE — Assessment & Plan Note (Addendum)
 Hyperkalemia Hypercalcemia Chronic kidney disease stage IIIb Baseline creatinine 1.2-1.6.  Worsened up to 2.4, minimal improvemetn with fluid challenge, holding torsemide .  Urine acellular on admission, MRI shows hydronephrosis.  Suspect this is obstructive superimposed on fairly advanced CKD.  Urology feel that stenting is unlikely to be successful   - Consult Urology and IR - Stop IV fluids - Repeat urine electroyltes - Strict I/Os - Daily BMP - Hold torsemide  - Avoid hypotension, nephrotoxins

## 2023-08-03 NOTE — Progress Notes (Signed)
  Progress Note   Patient: Jacqueline Hancock ZOX:[id number] DOB: 12-02-1940 DOA: 08/01/2023     2 DOS: the patient was seen and examined on 08/03/2023        Brief hospital course: 83 y.o. 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  Found to have large right perinephric mass and acute kidney injury.     Assessment and Plan: * Perinephric abdominal mass Recent PET suggest that this is FDG avid, and after reviewing imaging findings with urology, the general consensus that this is likely lymphoma. - Consult IR for biopsy - Outpatient Oncology follow up    AKI (acute kidney injury) (HCC) Hyperkalemia Hypercalcemia Chronic kidney disease stage IIIb Baseline creatinine 1.2-1.6.  Worsened up to 2.4, minimal improvemetn with fluid challenge, holding torsemide .  Urine acellular on admission, MRI shows hydronephrosis.  Suspect this is obstructive superimposed on fairly advanced CKD.  Urology feel that stenting is unlikely to be successful   - Consult Urology and IR - Stop IV fluids - Repeat urine electroyltes - Strict I/Os - Daily BMP - Hold torsemide  - Avoid hypotension, nephrotoxins    CLL (chronic lymphocytic leukemia) (HCC) - Consult Oncology  Hypertension BP high normal - Continue amlodipine  - Resume nadolol  - Hold torsemide           Subjective:  Patient is generally weak.  She has had no gross hematuria.  She is making good urine output.  No fever or confusion.     Physical Exam: BP (!) 141/70 (BP Location: Left Arm)   Pulse 74   Temp 98 F (36.7 C) (Oral)   Resp (!) 22   SpO2 99%   Elderly adult female,, interactive and appropriate RRR, no murmurs, no peripheral edema Respiratory rate normal, lung sounds diminished, no rales or wheezes Abdomen soft, somewhat distended, no tenderness palpation, no masses appreciated Attention normal, affect tired, judgment and insight appear normal, face symmetric, speech fluent,  generalized symmetric weakness    Data Reviewed: Basic metabolic panel shows creatinine 2.0, no change, electrolytes normal CBC shows anemia with hemoglobin 9.7, white blood cell count normal    Family Communication:      Disposition: Status is: Inpatient         Author: Ephriam Hashimoto, MD 08/03/2023 12:14 PM  For on call review [email or web address].

## 2023-08-03 NOTE — Assessment & Plan Note (Signed)
 BP high normal - Continue amlodipine  - Resume nadolol  - Hold torsemide 

## 2023-08-04 ENCOUNTER — Encounter (HOSPITAL_COMMUNITY): Payer: Self-pay | Admitting: Internal Medicine

## 2023-08-04 ENCOUNTER — Inpatient Hospital Stay (HOSPITAL_COMMUNITY)

## 2023-08-04 DIAGNOSIS — R1900 Intra-abdominal and pelvic swelling, mass and lump, unspecified site: Secondary | ICD-10-CM | POA: Diagnosis not present

## 2023-08-04 LAB — CBC
HCT: 34.8 % — ABNORMAL LOW (ref 36.0–46.0)
Hemoglobin: 10.2 g/dL — ABNORMAL LOW (ref 12.0–15.0)
MCH: 28.1 pg (ref 26.0–34.0)
MCHC: 29.3 g/dL — ABNORMAL LOW (ref 30.0–36.0)
MCV: 95.9 fL (ref 80.0–100.0)
Platelets: 238 10*3/uL (ref 150–400)
RBC: 3.63 MIL/uL — ABNORMAL LOW (ref 3.87–5.11)
RDW: 12.9 % (ref 11.5–15.5)
WBC: 8.9 10*3/uL (ref 4.0–10.5)
nRBC: 0 % (ref 0.0–0.2)

## 2023-08-04 LAB — LACTATE DEHYDROGENASE: LDH: 326 U/L — ABNORMAL HIGH (ref 98–192)

## 2023-08-04 LAB — BASIC METABOLIC PANEL WITH GFR
Anion gap: 11 (ref 5–15)
BUN: 43 mg/dL — ABNORMAL HIGH (ref 8–23)
CO2: 17 mmol/L — ABNORMAL LOW (ref 22–32)
Calcium: 10.2 mg/dL (ref 8.9–10.3)
Chloride: 110 mmol/L (ref 98–111)
Creatinine, Ser: 2.14 mg/dL — ABNORMAL HIGH (ref 0.44–1.00)
GFR, Estimated: 22 mL/min — ABNORMAL LOW (ref >60)
Glucose, Bld: 90 mg/dL (ref 70–99)
Potassium: 4.5 mmol/L (ref 3.5–5.1)
Sodium: 138 mmol/L (ref 135–145)

## 2023-08-04 LAB — URIC ACID: Uric Acid, Serum: 11.7 mg/dL — ABNORMAL HIGH (ref 2.5–7.1)

## 2023-08-04 MED ORDER — MIDAZOLAM HCL 2 MG/2ML IJ SOLN
INTRAMUSCULAR | Status: AC
  Filled 2023-08-04: qty 2

## 2023-08-04 MED ORDER — FENTANYL CITRATE (PF) 100 MCG/2ML IJ SOLN
INTRAMUSCULAR | Status: AC
  Filled 2023-08-04: qty 2

## 2023-08-04 MED ORDER — FENTANYL CITRATE (PF) 100 MCG/2ML IJ SOLN
INTRAMUSCULAR | Status: AC | PRN
  Administered 2023-08-04: 50 ug via INTRAVENOUS

## 2023-08-04 MED ORDER — MIDAZOLAM HCL 2 MG/2ML IJ SOLN
INTRAMUSCULAR | Status: AC | PRN
  Administered 2023-08-04: 1 mg via INTRAVENOUS

## 2023-08-04 NOTE — Plan of Care (Signed)
  Problem: Education: Goal: Knowledge of General Education information will improve Description: Including pain rating scale, medication(s)/side effects and non-pharmacologic comfort measures Outcome: Progressing   Problem: Clinical Measurements: Goal: Ability to maintain clinical measurements within normal limits will improve Outcome: Progressing Goal: Respiratory complications will improve Outcome: Progressing   Problem: Nutrition: Goal: Adequate nutrition will be maintained Outcome: Progressing   Problem: Coping: Goal: Level of anxiety will decrease Outcome: Progressing   Problem: Elimination: Goal: Will not experience complications related to urinary retention Outcome: Progressing   Problem: Pain Managment: Goal: General experience of comfort will improve and/or be controlled Outcome: Progressing   Problem: Skin Integrity: Goal: Risk for impaired skin integrity will decrease Outcome: Progressing

## 2023-08-04 NOTE — Procedures (Signed)
 Interventional Radiology Procedure Note  Procedure: CT guided biopsy of right retroperitoneal mass.   Complications: None  Estimated Blood Loss: None  Recommendations: - Bedrest x 1 hr   Signed,  Roxie Cord, MD

## 2023-08-04 NOTE — Progress Notes (Signed)
  Progress Note   Patient: Jacqueline Hancock MVH:[id number] DOB: [birth year] DOA: 08/01/2023     3 DOS: the patient was seen and examined on 08/04/2023 at 10:46AM      Brief hospital course: 83 y.o. 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  Found to have large right perinephric mass and acute kidney injury.     Assessment and Plan: * Perinephric abdominal mass IR biopsy today.    AKI (acute kidney injury) (HCC) Hyperkalemia Hypercalcemia Chronic kidney disease stage IIIb Creatinine unchanged.  Suspect this is obstructive superimposed on her chronic kidney disease - Avoid nephrotoxins and hypotension - Outpatient follow-up with nephrology    CLL (chronic lymphocytic leukemia) (HCC) - Consult Oncology  Hypertension Blood pressure elevated - Continue amlodipine , nadolol  - Hold torsemide           Subjective: Patient has no complaints, nursing of no concerns.  She will go for biopsy soon     Physical Exam: BP 125/62 (BP Location: Right Arm)   Pulse 65   Temp 97.9 F (36.6 C) (Oral)   Resp 17   SpO2 100%   Elderly adult female, sitting up in bed, interactive and appropriate RRR, no murmurs, no pitting edema Respiratory rate normal, lung sounds diminished overall, no rales or wheezes Abdomen soft, distended, no mass appreciated, no tenderness palpation Attention normal, affect pleasant, judgment and insight appear normal    Data Reviewed: Creatinine unchanged, electrolytes normal CBC unchanged    Family Communication: Granddaughter at the bedside    Disposition: Status is: Inpatient         Author: Ephriam Hashimoto, MD 08/04/2023 2:12 PM  For on call review [email or web address].

## 2023-08-04 NOTE — Progress Notes (Signed)
 MEDICATION-RELATED CONSULT NOTE   IR Procedure Consult - Anticoagulant/Antiplatelet PTA/Inpatient Med List Review by Pharmacist    Procedure: CT guided biopsy of right retroperitoneal mass.     Completed: 5/2 at 12:02   Post-Procedural bleeding risk per IR MD assessment:   - Low   Antithrombotic medications on inpatient or PTA profile prior to procedure:    - none     Recommended restart time per IR Post-Procedure Guidelines:     Other considerations:      Plan:     - No changes.    Gurbani Figge, PharmD, BCPS 08/04/2023 1:04 PM

## 2023-08-05 DIAGNOSIS — R1900 Intra-abdominal and pelvic swelling, mass and lump, unspecified site: Secondary | ICD-10-CM | POA: Diagnosis not present

## 2023-08-05 NOTE — Evaluation (Signed)
 Occupational Therapy Evaluation Patient Details Name: Jacqueline Hancock MRN: [id number] DOB: 1940/09/13 Today's Date: 08/05/2023   History of Present Illness   83 yo female admitted with perinephric abdominal mass s/p biopsy by IR 08/04/23, AKI. Hx including but not limited to: falls, CLL, CKD, anemia, osteopenia     Clinical Impressions Patient evaluated by Occupational Therapy with no further acute OT needs identified. All education has been completed and the patient has no further questions. Granddaughter present for evaluation. No OT needs identified other than use of RW if unsteady and reacher to be provided by family.  See below for any follow-up Occupational Therapy or equipment needs. OT is signing off. Thank you for this referral.      If plan is discharge home, recommend the following:   A little help with walking and/or transfers;A little help with bathing/dressing/bathroom;Assistance with cooking/housework;Assist for transportation;Help with stairs or ramp for entrance     Functional Status Assessment   Patient has not had a recent decline in their functional status     Equipment Recommendations   None recommended by OT     Recommendations for Other Services         Precautions/Restrictions   Precautions Precautions: Fall Restrictions Weight Bearing Restrictions Per Provider Order: No     Mobility Bed Mobility               General bed mobility comments: oob in recliner    Transfers Overall transfer level: Needs assistance Equipment used: Rolling walker (2 wheels) Transfers: Sit to/from Stand, Bed to chair/wheelchair/BSC Sit to Stand: Modified independent (Device/Increase time)     Step pivot transfers: Modified independent (Device/Increase time)     General transfer comment: min cues for hand placement      Balance Overall balance assessment: Mild deficits observed, not formally tested                                          ADL either performed or assessed with clinical judgement   ADL Overall ADL's : Needs assistance/impaired Eating/Feeding: Modified independent   Grooming: Wash/dry hands;Wash/dry face;Applying deodorant;Brushing hair;Modified independent   Upper Body Bathing: Modified independent;Sitting   Lower Body Bathing: Supervison/ safety;Sitting/lateral leans;Sit to/from stand   Upper Body Dressing : Modified independent;Sitting   Lower Body Dressing: Supervision/safety;Sitting/lateral leans;Sit to/from stand   Toilet Transfer: Supervision/safety   Toileting- Architect and Hygiene: Modified independent       Functional mobility during ADLs: Supervision/safety General ADL Comments: set up, family will obtain reacher     Vision Baseline Vision/History: 0 No visual deficits       Perception Perception: Within Functional Limits       Praxis Praxis: WFL       Pertinent Vitals/Pain Pain Assessment Pain Assessment: No/denies pain     Extremity/Trunk Assessment Upper Extremity Assessment Upper Extremity Assessment: Right hand dominant;Overall WFL for tasks assessed   Lower Extremity Assessment Lower Extremity Assessment: Generalized weakness   Cervical / Trunk Assessment Cervical / Trunk Assessment: Normal   Communication Communication Communication: No apparent difficulties   Cognition Arousal: Alert Behavior During Therapy: WFL for tasks assessed/performed Cognition: No apparent impairments                               Following commands: Intact       Cueing  General Comments   Cueing Techniques: Verbal cues  no SOB or edema noted           Home Living Family/patient expects to be discharged to:: Private residence Living Arrangements: Alone Available Help at Discharge: Family;Available PRN/intermittently Type of Home: House Home Access: Stairs to enter [employer] of Steps: 2   Home Layout: One level      Bathroom Shower/Tub: Producer, television/film/video: Standard Bathroom Accessibility: Yes How Accessible: Accessible via walker Home Equipment: Agricultural consultant (2 wheels)   Additional Comments: Jacqueline Hancock present (grand daughter) for care coordination and education      Prior Functioning/Environment Prior Level of Function : Independent/Modified Independent;Driving             Mobility Comments: uses RW PRN ADLs Comments: mod ind     AM-PAC OT "6 Clicks" Daily Activity     Outcome Measure Help from another person eating meals?: None Help from another person taking care of personal grooming?: None Help from another person toileting, which includes using toliet, bedpan, or urinal?: None Help from another person bathing (including washing, rinsing, drying)?: None Help from another person to put on and taking off regular upper body clothing?: None Help from another person to put on and taking off regular lower body clothing?: A Little 6 Click Score: 23   End of Session Equipment Utilized During Treatment: Gait belt;Rolling walker (2 wheels) Nurse Communication: Mobility status  Activity Tolerance: Patient tolerated treatment well Patient left: with nursing/sitter in room;with family/visitor present;in chair;with call bell/phone within reach                   Time: 1405-1420 OT Time Calculation (min): 15 min Charges:  OT General Charges $OT Visit: 1 Visit OT Evaluation $OT Eval Low Complexity: 1 Low  Tanee Henery OT/L Acute Rehabilitation Department  [phone]  08/05/2023, 3:27 PM

## 2023-08-05 NOTE — Discharge Summary (Signed)
 Physician Discharge Summary   Patient: Jacqueline Hancock MRN: [id number] DOB: 1940/10/26  Admit date:     08/01/2023  Discharge date: 08/05/23  Discharge Physician: Jacqueline Hancock   PCP: Jacqueline Heman, NP     Recommendations at discharge:  Follow up with PCP Jacqueline Hancock for AKI Jacqueline Hancock: Please refer to Nephrology for advancing renal insufficiency Check BMP within 1 week  Follow up with Oncology Dr. Salomon Hancock for abdominal mass, suspected lymphoma Dr. Salomon Hancock: Please follow up biopsy result     Discharge Diagnoses: Principal Problem:   Perinephric abdominal mass Active Problems:   AKI (acute kidney injury) (HCC)   Hypertension   Chronic kidney disease, stage 3b (HCC)   Hypercalcemia   CLL (chronic lymphocytic leukemia) (HCC)   Hyperkalemia      Hospital Course: 83 y.o. 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  Found to have large right perinephric mass and acute kidney injury.     * Perinephric abdominal mass Recent PET suggest that this is FDG avid, and after reviewing imaging findings with urology, it was felt that the mass was very unlikely renal in origin, and unlikely hematoma or abscess.  IR were consulted, the patient underwent image guided biopsy.  Biopsy pending at the time of discharge. -Follow-up with oncology   AKI (acute kidney injury) (HCC) Hyperkalemia Hypercalcemia Chronic kidney disease stage IIIb Baseline creatinine 1.2-1.6.  Worsened up to 2.4.  Fractional secretion of sodium greater than 1%, and no improvement with holding torsemide  and IV fluids.  Urine acellular on admission, and MRI showed hydronephrosis.  Suspected this was some degree of obstruction on fairly advanced CKD.  Urology were consulted and felt that stenting had a low success rate and recommended percutaneous nephrostomy.  Urology discussed PCN with interventional radiology, and Dr. Darylene Hancock from Interventional Radiology declined  to place drainage tube.    - Recommend stopping torsemide .  - Check BMP in 1 week - Refer to Jacqueline Hancock                The [place]  Controlled Substances Registry was reviewed for this patient prior to discharge.  Consultants: Oncology Interventional Radiology Urology  Procedures performed: MRI abdomen CT abdomen CT guided biopsy   Disposition: Home health Diet recommendation:  Regular diet  DISCHARGE MEDICATION: Allergies as of 08/05/2023       Reactions   Nexium [esomeprazole Magnesium] Other (See Comments)   Headache   Peanut Butter Flavoring Agent (non-screening) Itching        Medication List     STOP taking these medications    potassium chloride  10 MEQ CR capsule Commonly known as: MICRO-K    torsemide  20 MG tablet Commonly known as: DEMADEX        TAKE these medications    amLODipine  5 MG tablet Commonly known as: NORVASC  Take 1 tablet (5 mg total) by mouth daily.   CLARITIN PO Take by mouth daily.   Iron 325 (65 Fe) MG Tabs Take 325 mg by mouth daily.   nadolol  20 MG tablet Commonly known as: CORGARD  TAKE 1 TABLET BY MOUTH DAILY FOR BLOOD PRESSURE OR TREMORS What changed:  how much to take how to take this when to take this   ondansetron  4 MG tablet Commonly known as: ZOFRAN  Take 1 tablet (4 mg total) by mouth every 8 (eight) hours as needed for nausea or vomiting.   polyethylene glycol 17 g packet Commonly known as: MIRALAX  / GLYCOLAX  Take  17 g by mouth daily as needed for mild constipation or moderate constipation. Also available OTC   senna-docusate 8.6-50 MG tablet Commonly known as: Senokot-S Take 2 tablets by mouth at bedtime. For constipation   traMADol  50 MG tablet Commonly known as: ULTRAM  Take 1 tablet (50 mg total) by mouth every 8 (eight) hours as needed for moderate pain (pain score 4-6) or severe pain (pain score 7-10) (pain).   VITAMIN B-COMPLEX PO Take 1 tablet by mouth daily.         Follow-up Information     Hancock, Jacqueline C, NP Follow up.   Specialty: Family Medicine Contact information: [address] [place] [address] [phone]         Jacqueline Israel, MD Follow up.   Specialties: Hematology, Oncology Contact information: [address] [place] [address] [phone]         [facility] Follow up.   Specialty: Home Health Services Contact information: [address] [place] [address] [phone]                 Discharge Instructions     Discharge instructions   Complete by: As directed    **IMPORTANT DISCHARGE INSTRUCTIONS**   From Dr. Darlyn Hancock: You were admitted for a kidney mass and worsening kidney failure.  You had a biopsy of the mass, and Dr. Salomon Hancock will follow it up If you haven't heard from him by Thursday, call his office  Your kidney failure did not improve with stopping your torsemide  and hydrating the kidneys with IV fluids, and so we believe it is from the mass blocking your kidney.  The Urology and Interventional Radiology doctors do not believe there is anything we can do to relieve this  For now, STOP torsemide  and potassium Go see Dr. Arlis Hancock in 1 week  Have labs checked later this week  Ask Dr. Arlis Hancock for a referral to a nephrologist (kidney specialist)   Increase activity slowly   Complete by: As directed    No wound care   Complete by: As directed        Discharge Exam: There were no vitals filed for this visit.  General: Pt is alert, awake, not in acute distress Cardiovascular: RRR, nl S1-S2, no murmurs appreciated.   No LE edema.   Respiratory: Normal respiratory rate and rhythm.  CTAB without rales or wheezes. Abdominal: Abdomen soft and non-tender.  No distension or HSM.   Neuro/Psych: Strength symmetric in upper and lower extremities.  Judgment and insight appear normal.   Condition at discharge: good  The results of significant  diagnostics from this hospitalization (including imaging, microbiology, ancillary and laboratory) are listed below for reference.   Imaging Studies: CT ABDOMINAL MASS BIOPSY Result Date: 08/04/2023 INDICATION: Retroperitoneal mass.  Personal history of lymphoma. EXAM: CT-guided core biopsy of right retroperitoneal mass MEDICATIONS: None. ANESTHESIA/SEDATION: Moderate (conscious) sedation was employed during this procedure. A total of Versed  1 mg and Fentanyl  50 mcg was administered intravenously by the radiology nurse. Total intra-service moderate Sedation Time: 17 minutes. The patient's level of consciousness and vital signs were monitored continuously by radiology nursing throughout the procedure under my direct supervision. COMPLICATIONS: None immediate. PROCEDURE: Informed written consent was obtained from the patient after a thorough discussion of the procedural risks, benefits and alternatives. All questions were addressed. Maximal Sterile Barrier Technique was utilized including caps, mask, sterile gowns, sterile gloves, sterile drape, hand hygiene and skin antiseptic. A timeout was  performed prior to the initiation of the procedure. CT imaging was performed on the large right retroperitoneal mass was identified. A suitable skin entry site was selected and marked. Local anesthesia was attained by infiltration with 1% lidocaine . A small dermatotomy was made. Under intermittent CT guidance, a 17 gauge introducer needle was carefully advanced into the posterolateral aspect of the mass. Multiple 18 gauge core biopsies were then obtained coaxially using the bio Pince automated biopsy device. Biopsy specimens were placed in saline and delivered to pathology for further analysis. IMPRESSION: CT-guided core biopsy of right retroperitoneal mass. Electronically Signed   By: Fernando Hoyer M.D.   On: 08/04/2023 12:57   MR ABDOMEN WO CONTRAST Result Date: 08/02/2023 CLINICAL DATA:  Abnormal soft tissue in the  right renal fossa on prior imaging. EXAM: MRI ABDOMEN WITHOUT CONTRAST TECHNIQUE: Multiplanar multisequence MR imaging was performed without the administration of intravenous contrast. COMPARISON:  CT stone study 08/01/2023. PET-CT 07/17/2023. Abdomen pelvis CT with contrast 06/14/2023. FINDINGS: Lower chest: No acute findings Hepatobiliary: No focal abnormality in the liver on noncontrast imaging. Gallbladder is nondistended with multiple tiny gallstones evident. No intrahepatic or extrahepatic biliary dilation. Pancreas: No focal mass lesion. No dilatation of the main duct. No intraparenchymal cyst. No peripancreatic edema. Spleen:  No splenomegaly. No suspicious focal mass lesion. Adrenals/Urinary Tract: Left adrenal gland unremarkable. Right adrenal gland not discretely visible. Mild fullness left intrarenal collecting system. Extensive abnormal soft tissue is seen in and around the right kidney and involving the right retroperitoneal space. 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 Punctate areas of signal void in the region of the right renal pelvis may be tiny calcified gallstones are foci of collecting system gas. This tissue shows marked diffusion restriction and tracks anteriorly to encase the descending and transverse duodenum and extends into the central abdominal mesentery. Inferior extent in the retroperitoneal space tracks down to the pelvis and right greater than left pelvic sidewall although this region is been incompletely visualized on this abdomen MRI. Lack of intravenous contrast hinders assessment. Stomach/Bowel: Tiny hiatal hernia. Stomach is nondilated. Duodenum is nondilated. No small bowel or colonic dilatation within the visualized abdomen. Vascular/Lymphatic: No abdominal aortic aneurysm. Abnormal soft tissue in the right retroperitoneum generates mass-effect on the IVC with anterior displacement in the upper abdomen although  signal void in the lumen of the IVC suggests that it remains patent. Other: Areas of free fluid are seen at the inferior tip of the liver and right paracolic gutter. Scattered tiny fluid collections are seen in the retroperitoneal space. Diffuse mesenteric and body wall edema evident. Musculoskeletal: No overtly suspicious marrow signal abnormality. IMPRESSION: 1. Extensive abnormal soft tissue in and around the right kidney and involving the right retroperitoneal space. This tissue tracks anteriorly to encase the descending and transverse duodenum and extends into the central abdominal mesentery. Inferior extent in the retroperitoneal space tracks down to the pelvis and right greater than left pelvic sidewalls although this region has been incompletely visualized on this abdomen MRI. Lack of intravenous contrast hinders assessment. As on the recent PET-CT, given that most of the abnormal tissue shows restricted diffusion, this is likely related to lymphoma given the patient's history although, again, assessment is limited by lack of intravenous contrast material. Tissue sampling would likely prove helpful. 2. 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 Although there does not appear to be a large retroperitoneal fluid collection, underlying component of urine  leak is not excluded. Punctate areas of signal void in the region of the right renal pelvis may be tiny calcified stones or foci of collecting system gas. 3. Small volume intraperitoneal free fluid. 4. Cholelithiasis. 5. Tiny hiatal hernia. 6. Diffuse mesenteric and body wall edema. Electronically Signed   By: Donnal Fusi M.D.   On: 08/02/2023 11:42   CT Renal Stone Study Result Date: 08/01/2023 CLINICAL DATA:  Abdominal/flank pain, stone suspected EXAM: CT ABDOMEN AND PELVIS WITHOUT CONTRAST TECHNIQUE: Multidetector CT imaging of the abdomen and pelvis was performed following the standard protocol  without IV contrast. RADIATION DOSE REDUCTION: This exam was performed according to the departmental dose-optimization program which includes automated exposure control, adjustment of the mA and/or kV according to patient size and/or use of iterative reconstruction technique. COMPARISON:  June 14, 2023 FINDINGS: Of note, the lack of intravenous contrast limits evaluation of the solid organ parenchyma and vascularity. Lower chest: Interlobular septal thickening with patchy ground-glass opacities in the lung bases. No pleural effusion. Hepatobiliary: No mass. Decompressed gallbladder without radiopaque stones or wall thickening. No intrahepatic or extrahepatic biliary ductal dilation. Pancreas: No mass or main ductal dilation. No peripancreatic inflammation or fluid collection. None in the a he air Spleen: Normal size. No mass. Adrenals/Urinary Tract: No left adrenal mass. The right adrenal gland is not well visualized. The right renal parenchyma is not well visualized due to the lack of intravenous contrast and large amount of soft tissue attenuation filling the right renal fossa, measuring 13.7 x 9.7 x 17.8 cm (unchanged from the prior PET-CT, but larger than on the prior CT, where it measured 10.5 x 9.9 x 15.9 cm). Moderate right-sided hydronephrosis with abrupt narrowing in the region of the UPJ, likely due to extrinsic compression. The urinary bladder is completely decompressed. Stomach/Bowel: The stomach is decompressed without focal abnormality. No small bowel wall thickening or inflammation. No small bowel obstruction. The appendix was not visualized. No right lower quadrant or pericecal inflammatory changes to suggest acute appendicitis. Vascular/Lymphatic: No aortic aneurysm. Diffuse aortoiliac atherosclerosis. Numerous subcentimeter upper retroperitoneal lymph nodes measuring up to 7 mm, likely reactive. Reproductive: Hysterectomy. No concerning adnexal mass. Other: No pneumoperitoneum. A large amount of  right retroperitoneal hemorrhage extending into the pelvis with additional hemorrhage extending in throughout the left retroperitoneal space, into the root of the mesentery, and along the left retroperitoneal space. Small volume perihepatic and perisplenic ascites. Musculoskeletal: No acute fracture or destructive lesion. IMPRESSION: 1. Redemonstrated abnormal soft tissue density material filling the right renal fossa, measuring 13.7 x 9.7 x 17.8 cm (unchanged from the prior PET-CT, but larger than the prior CT from March 12, where it measured 10.5 x 9.9 x 5.9 cm). Worsening soft tissue extending throughout the right and left retroperitoneal spaces, into the mesentery, and into the pelvis. Given the appearance on the prior studies, this may represent worsening retroperitoneal hematoma or soft tissue infiltration from possible lymphoma given the patient's history. On the prior PET-CT, the increased radiotracer activity could also be related to underlying collecting system injury in the setting of renal trauma. A multiphase CT of the abdomen and pelvis (with collecting phase imaging), recommended for further characterization. 2. Moderate right-sided hydronephrosis with abrupt narrowing at the UPJ, likely due to extrinsic compression from the perinephric and retroperitoneal soft tissue. 3. Findings in the lung bases, worrisome for either a developing bronchopneumonia, which includes viral etiologies, or early pulmonary edema. Critical Value/emergent results were called by telephone at the time of interpretation on  08/01/2023 at 3:28 pm to provider DAN FLOYD , who verbally acknowledged these results. Electronically Signed   By: Rance Burrows M.D.   On: 08/01/2023 15:53   DG Chest Port 1 View Result Date: 08/01/2023 CLINICAL DATA:  Cough and shortness of breath. EXAM: PORTABLE CHEST 1 VIEW COMPARISON:  07/30/2023 FINDINGS: Single-view of the chest. Lungs are clear except for questionable mild blunting at the right  costophrenic angle. Heart size is within normal limits and stable. Trachea is midline. Degenerative changes at the bilateral AC joints. IMPRESSION: 1. Questionable blunting at the right costophrenic angle. Cannot exclude small right pleural effusion. Otherwise, no acute chest finding. Electronically Signed   By: Elene Griffes M.D.   On: 08/01/2023 14:17   DG Chest 2 View Result Date: 07/30/2023 CLINICAL DATA:  Initial evaluation for shortness of breath. EXAM: CHEST - 2 VIEW COMPARISON:  Prior radiograph from 07/12/2023. FINDINGS: Transverse heart size stable, and remains within normal limits. Mediastinal silhouette within normal limits. Lungs are hypoinflated with scattered basilar predominant fibrotic lung changes, consistent with history of fibrotic lung disease. Slightly increased prominence of bibasilar interstitial markings could reflect superimposed interstitial edema or possibly atypical pneumonitis. No frank consolidative airspace disease. Trace right pleural effusion. No overt pulmonary edema or pneumothorax. Visualized osseous structures and soft tissues demonstrate no acute finding. IMPRESSION: 1. Hypoinflation with scattered basilar predominant fibrotic lung changes, consistent with history of fibrotic lung disease. 2. Slightly increased prominence of bibasilar interstitial markings as compared to prior, which could reflect superimposed interstitial edema or possibly atypical pneumonitis. No consolidative airspace disease. 3. Trace right pleural effusion. Electronically Signed   By: Virgia Griffins M.D.   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 EXAM: NUCLEAR MEDICINE PET SKULL BASE TO THIGH TECHNIQUE: 7.4 mCi F-18 FDG was injected intravenously. Full-ring PET imaging was performed from the skull base to thigh after the radiotracer. CT data was obtained and  used for attenuation correction and anatomic localization. Fasting blood glucose: 103 mg/dl COMPARISON:  16/01/9603 and CT abdomen/pelvis from 06/14/2023 FINDINGS: Mediastinal blood pool activity: SUV max 2.0 Liver activity: SUV max NA NECK: No significant abnormal hypermetabolic activity in this region. Incidental CT findings: None. CHEST: No significant abnormal hypermetabolic activity in this region. Incidental CT findings: Mild atheromatous vascular calcification involving the thoracic aorta. Small type 1 hiatal hernia. ABDOMEN/PELVIS: On the CT of 08/22/2022, the right kidney appeared completely normal aside from non-rotation. On 06/14/2023 there was extensive perinephric process with ill definition of the right kidney upper pole, substantial vascular displacement, and abnormal density extending along the right diaphragm and right retroperitoneum ascribed to grade V renal injury due to the provided history of trauma and lack of abnormal appearance in this vicinity on the prior CT abdomen. Today there is accentuated activity present throughout the large right perinephric collection, with the upper portion having a maximum SUV around 7.9 (Deauville 5) but portions along the lower renal hilum, periureteral region, and right-sided mesentery with a maximum SUV up to 26.5 (Deauville 5). There is also clearly some ascites along the inferior margin of the right hepatic lobe which is not hypermetabolic. Although a urine leak could potentially explain high activity due to leaked radiopharmaceutical in the right perinephric fluid collection and right mesentery, I am suspicious that most of the right renal, perinephric, periureteral, and right mesenteric activity in masslike appearance is actually due to lymphoma.  One other possibility is that the patient did have combination of both perinephric tumor and also injury/hematoma on the 06/14/2023 exam. The total extent of abnormal perinephric, periureteral, retroperitoneal,  and mesenteric activity is about 14.4 by 9.1 by 30.0 cm (volume = 2100 cm^3). This appears increased/progressive compared to 06/14/2023. Incidental CT findings: Mild ascites above the urinary bladder and along the right paracolic gutter. Atherosclerosis is present, including aortoiliac atherosclerotic disease. SKELETON: No significant abnormal hypermetabolic activity in this region. Incidental CT findings: None. IMPRESSION: 1. Increase in volume of right perinephric, periureteral, retroperitoneal, and mesenteric density which is hypermetabolic on today's exam. Given the current appearance, I suspect that much of this actually represents lymphoma rather than blood products and hematoma, although the prior diagnosis from 06/14/2023 seems eminently reasonable given the trauma history, striking appearance, and new onset compared to 08/22/2022. Given that there is some perihepatic and pelvic ascites, the appearance could represent a combination of lymphoma and hematoma/trauma. Normally in this situation I would recommend a hematuria protocol CT with and without contrast to assess for enhancement of the perirenal tissues and also to look for urine leak. However, the patient's most recent GFR is only 32, and accordingly CT contrast administration is probably not advisable. Other possibilities to further characterize the perinephric collections include renal protocol MRI with and without contrast (to assess for tissue enhancement in phases prior to the excretory phase) or possibly nuclear medicine renal scintigraphy. 2. Small type 1 hiatal hernia. 3. Aortic atherosclerosis. Electronically Signed   By: Freida Jes M.D.   On: 07/26/2023 14:46   DG Chest 2 View Result Date: 07/12/2023 CLINICAL DATA:  Shortness of breath. EXAM: CHEST - 2 VIEW COMPARISON:  June 02, 2023. FINDINGS: Stable cardiomediastinal silhouette. No acute pulmonary disease is noted. The visualized skeletal structures are unremarkable.  IMPRESSION: No active cardiopulmonary disease. Electronically Signed   By: Rosalene Colon M.D.   On: 07/12/2023 12:13    Microbiology: Results for orders placed or performed during the hospital encounter of 08/01/23  Resp panel by RT-PCR (RSV, Flu A&B, Covid) Anterior Nasal Swab     Status: None   Collection Time: 08/01/23  1:34 PM   Specimen: Anterior Nasal Swab  Result Value Ref Range Status   SARS Coronavirus 2 by RT PCR NEGATIVE NEGATIVE Final    Comment: (NOTE) SARS-CoV-2 target nucleic acids are NOT DETECTED.  The SARS-CoV-2 RNA is generally detectable in upper respiratory specimens during the acute phase of infection. The lowest concentration of SARS-CoV-2 viral copies this assay can detect is 138 copies/mL. A negative result does not preclude SARS-Cov-2 infection and should not be used as the sole basis for treatment or other patient management decisions. A negative result may occur with  improper specimen collection/handling, submission of specimen other than nasopharyngeal swab, presence of viral mutation(s) within the areas targeted by this assay, and inadequate number of viral copies(<138 copies/mL). A negative result must be combined with clinical observations, patient history, and epidemiological information. The expected result is Negative.  Fact Sheet for Patients:  BloggerCourse.com  Fact Sheet for Healthcare Providers:  SeriousBroker.it  This test is no t yet approved or cleared by the [place]  FDA and  has been authorized for detection and/or diagnosis of SARS-CoV-2 by FDA under an Emergency Use Authorization (EUA). This EUA will remain  in effect (meaning this test can be used) for the duration of the COVID-19 declaration under Section 564(b)(1) of the Act, 21 U.S.C.section 360bbb-3(b)(1), unless the authorization is terminated  or revoked sooner.       Influenza A by PCR NEGATIVE NEGATIVE Final    Influenza B by PCR NEGATIVE NEGATIVE Final    Comment: (NOTE) The Xpert Xpress SARS-CoV-2/FLU/RSV plus assay is intended as an aid in the diagnosis of influenza from Nasopharyngeal swab specimens and should not be used as a sole basis for treatment. Nasal washings and aspirates are unacceptable for Xpert Xpress SARS-CoV-2/FLU/RSV testing.  Fact Sheet for Patients: BloggerCourse.com  Fact Sheet for Healthcare Providers: SeriousBroker.it  This test is not yet approved or cleared by the [place]  FDA and has been authorized for detection and/or diagnosis of SARS-CoV-2 by FDA under an Emergency Use Authorization (EUA). This EUA will remain in effect (meaning this test can be used) for the duration of the COVID-19 declaration under Section 564(b)(1) of the Act, 21 U.S.C. section 360bbb-3(b)(1), unless the authorization is terminated or revoked.     Resp Syncytial Virus by PCR NEGATIVE NEGATIVE Final    Comment: (NOTE) Fact Sheet for Patients: BloggerCourse.com  Fact Sheet for Healthcare Providers: SeriousBroker.it  This test is not yet approved or cleared by the [place]  FDA and has been authorized for detection and/or diagnosis of SARS-CoV-2 by FDA under an Emergency Use Authorization (EUA). This EUA will remain in effect (meaning this test can be used) for the duration of the COVID-19 declaration under Section 564(b)(1) of the Act, 21 U.S.C. section 360bbb-3(b)(1), unless the authorization is terminated or revoked.  Performed at [facility], [address] [place], [place] [address]   Blood culture (routine x 2)     Status: None (Preliminary result)   Collection Time: 08/01/23  4:16 PM   Specimen: BLOOD LEFT ARM  Result Value Ref Range Status   Specimen Description   Final    BLOOD LEFT ARM Performed at [facility], [address]  [place], [place] [address]    Special Requests   Final    BOTTLES DRAWN AEROBIC AND ANAEROBIC Blood Culture adequate volume Performed at [facility], [address], [place], [place] [address]    Culture   Final    NO GROWTH 4 DAYS Performed at [facility], [address] [place], [place] [address]    Report Status PENDING  Incomplete  Blood culture (routine x 2)     Status: None (Preliminary result)   Collection Time: 08/01/23  7:46 PM   Specimen: BLOOD RIGHT ARM  Result Value Ref Range Status   Specimen Description   Final    BLOOD RIGHT ARM Performed at [facility], [address], [place], [place] [address]    Special Requests   Final    BOTTLES DRAWN AEROBIC ONLY Blood Culture results may not be optimal due to an inadequate volume of blood received in culture bottles Performed at [facility], [address], [place], [place] [address]    Culture   Final    NO GROWTH 4 DAYS Performed at [facility], [address], [place], [place] [address]    Report Status PENDING  Incomplete    Labs: CBC: Recent Labs  Lab 07/30/23 1803 08/01/23 1412 08/02/23 0526 08/03/23 0524 08/04/23 0513  WBC 12.3* 9.9 8.3 8.6 8.9  NEUTROABS 8.6* 6.9  --   --   --   HGB 12.0 10.9* 10.0* 9.7* 10.2*  HCT 36.6 34.7* 33.2* 32.3* 34.8*  MCV 88.4 90.1 93.5 94.4 95.9  PLT 324 277  233 233 238   Basic Metabolic Panel: Recent Labs  Lab 07/30/23 1803 08/01/23 1412 08/02/23 0526 08/03/23 0524 08/04/23 0509  NA 134* 133* 136 135 138  K 5.6* 5.6* 5.0 4.4 4.5  CL 98 99 104 107 110  CO2 21* 22 19* 17* 17*  GLUCOSE 100* 86 77 101* 90  BUN 58* 59* 54* 46* 43*  CREATININE 2.42* 2.16* 2.04* 2.03* 2.14*  CALCIUM 11.6* 10.6* 9.8 9.8 10.2   Liver Function Tests: Recent Labs  Lab 07/30/23 1803 08/01/23 1412 08/03/23 0524  AST 45* 36 27  ALT 37 33 23  ALKPHOS 56 58 50  BILITOT 1.1 1.2 0.6  PROT 6.8 6.8 5.8*  ALBUMIN 3.4* 3.4* 2.6*    CBG: Recent Labs  Lab 08/02/23 1627  GLUCAP 81    Discharge time spent: approximately 45 minutes spent on discharge counseling, evaluation of patient on day of discharge, and coordination of discharge planning with nursing, social work, pharmacy and case management  Signed: Ephriam Hashimoto, MD [facility] 08/05/2023

## 2023-08-05 NOTE — Plan of Care (Signed)
  Problem: Education: Goal: Knowledge of General Education information will improve Description Including pain rating scale, medication(s)/side effects and non-pharmacologic comfort measures Outcome: Progressing   Problem: Clinical Measurements: Goal: Will remain free from infection Outcome: Progressing Goal: Cardiovascular complication will be avoided Outcome: Progressing   Problem: Activity: Goal: Risk for activity intolerance will decrease Outcome: Progressing

## 2023-08-05 NOTE — Progress Notes (Signed)
 AVS reviewed w/ pt who verbalized an understanding- no other questions. PIV removed as noted Pt dressed for d/c to home. Pt to lobby via w/c home w/ family

## 2023-08-05 NOTE — Plan of Care (Signed)
  Problem: Education: Goal: Knowledge of General Education information will improve Description: Including pain rating scale, medication(s)/side effects and non-pharmacologic comfort measures 08/05/2023 1342 by Kerwin Peels, RN Outcome: Adequate for Discharge 08/05/2023 1025 by Kerwin Peels, RN Outcome: Progressing   Problem: Health Behavior/Discharge Planning: Goal: Ability to manage health-related needs will improve 08/05/2023 1342 by Kerwin Peels, RN Outcome: Adequate for Discharge 08/05/2023 1025 by Kerwin Peels, RN Outcome: Progressing   Problem: Clinical Measurements: Goal: Ability to maintain clinical measurements within normal limits will improve 08/05/2023 1342 by Kerwin Peels, RN Outcome: Adequate for Discharge 08/05/2023 1025 by Kerwin Peels, RN Outcome: Progressing Goal: Will remain free from infection 08/05/2023 1342 by Kerwin Peels, RN Outcome: Adequate for Discharge 08/05/2023 1025 by Kerwin Peels, RN Outcome: Progressing Goal: Diagnostic test results will improve 08/05/2023 1342 by Kerwin Peels, RN Outcome: Adequate for Discharge 08/05/2023 1025 by Kerwin Peels, RN Outcome: Progressing Goal: Respiratory complications will improve 08/05/2023 1342 by Kerwin Peels, RN Outcome: Adequate for Discharge 08/05/2023 1025 by Kerwin Peels, RN Outcome: Progressing Goal: Cardiovascular complication will be avoided Outcome: Adequate for Discharge   Problem: Activity: Goal: Risk for activity intolerance will decrease Outcome: Adequate for Discharge   Problem: Nutrition: Goal: Adequate nutrition will be maintained Outcome: Adequate for Discharge   Problem: Coping: Goal: Level of anxiety will decrease Outcome: Adequate for Discharge   Problem: Elimination: Goal: Will not experience complications related to bowel motility Outcome: Adequate for Discharge Goal: Will not experience  complications related to urinary retention Outcome: Adequate for Discharge   Problem: Pain Managment: Goal: General experience of comfort will improve and/or be controlled Outcome: Adequate for Discharge   Problem: Safety: Goal: Ability to remain free from injury will improve Outcome: Adequate for Discharge   Problem: Skin Integrity: Goal: Risk for impaired skin integrity will decrease Outcome: Adequate for Discharge

## 2023-08-05 NOTE — Evaluation (Signed)
 Physical Therapy Evaluation Patient Details Name: Jacqueline Hancock MRN: [id number] DOB: 17-May-1940 Today's Date: 08/05/2023  History of Present Illness  83 yo female admitted with perinephric abdominal mass s/p biopsy by IR 08/04/23, AKI. Hx including but not limited to: falls, CLL, CKD, anemia, osteopenia  Clinical Impression  On eval, pt was CGA for mobility. She walked ~150 feet without an assistive device-tolerated distance well. Will plan to follow pt acutely. Recommend pt resume HHPT after discharge.         If plan is discharge home, recommend the following:     Can travel by private vehicle        Equipment Recommendations None recommended by PT  Recommendations for Other Services       Functional Status Assessment Patient has had a recent decline in their functional status and demonstrates the ability to make significant improvements in function in a reasonable and predictable amount of time.     Precautions / Restrictions Precautions Precautions: Fall Restrictions Weight Bearing Restrictions Per Provider Order: No      Mobility  Bed Mobility               General bed mobility comments: oob in recliner    Transfers Overall transfer level: Needs assistance Equipment used: None Transfers: Sit to/from Stand Sit to Stand: Modified independent (Device/Increase time)           General transfer comment: increased time    Ambulation/Gait Ambulation/Gait assistance: Contact guard assist Gait Distance (Feet): 150 Feet Assistive device: None Gait Pattern/deviations: Step-through pattern, Decreased stride length       General Gait Details: Slow gait speed. 1 instance of instability. Tolerated distance well.  Stairs            Wheelchair Mobility     Tilt Bed    Modified Rankin (Stroke Patients Only)       Balance Overall balance assessment: Needs assistance         Standing balance support: During functional activity Standing  balance-Leahy Scale: Good Standing balance comment: Had pt perform static stanidng with: EO/EC, narrow BOS, withstanding of perturbations. Also had her perform 360 degree turn and pick up object                             Pertinent Vitals/Pain Pain Assessment Pain Assessment: No/denies pain    Home Living Family/patient expects to be discharged to:: Private residence Living Arrangements: Alone Available Help at Discharge: Family;Available PRN/intermittently Type of Home: House Home Access: Stairs to enter   [employer] of Steps: 2   Home Layout: One level Home Equipment: Agricultural consultant (2 wheels)      Prior Function Prior Level of Function : Independent/Modified Independent;Driving             Mobility Comments: uses RW PRN ADLs Comments: mod ind     Extremity/Trunk Assessment   Upper Extremity Assessment Upper Extremity Assessment: Defer to OT evaluation    Lower Extremity Assessment Lower Extremity Assessment: Generalized weakness    Cervical / Trunk Assessment Cervical / Trunk Assessment: Normal  Communication   Communication Communication: No apparent difficulties    Cognition Arousal: Alert Behavior During Therapy: WFL for tasks assessed/performed   PT - Cognitive impairments: No apparent impairments                         Following commands: Intact  Cueing Cueing Techniques: Verbal cues     General Comments      Exercises     Assessment/Plan    PT Assessment Patient needs continued PT services  PT Problem List Decreased strength;Decreased range of motion;Decreased activity tolerance;Decreased balance;Decreased mobility;Decreased knowledge of use of DME       PT Treatment Interventions DME instruction;Gait training;Therapeutic activities;Therapeutic exercise;Functional mobility training;Patient/family education;Balance training    PT Goals (Current goals can be found in the Care Plan section)   Acute Rehab PT Goals Patient Stated Goal: home PT Goal Formulation: With patient Time For Goal Achievement: 08/19/23 Potential to Achieve Goals: Good    Frequency Min 3X/week     Co-evaluation               AM-PAC PT "6 Clicks" Mobility  Outcome Measure Help needed turning from your back to your side while in a flat bed without using bedrails?: A Little Help needed moving from lying on your back to sitting on the side of a flat bed without using bedrails?: A Little Help needed moving to and from a bed to a chair (including a wheelchair)?: A Little Help needed standing up from a chair using your arms (e.g., wheelchair or bedside chair)?: A Little Help needed to walk in hospital room?: A Little Help needed climbing 3-5 steps with a railing? : A Little 6 Click Score: 18    End of Session Equipment Utilized During Treatment: Gait belt Activity Tolerance: Patient tolerated treatment well Patient left: in chair;with call bell/phone within reach;with family/visitor present   PT Visit Diagnosis: Unsteadiness on feet (R26.81)    Time: 4782-9562 PT Time Calculation (min) (ACUTE ONLY): 17 min   Charges:   PT Evaluation $PT Eval Low Complexity: 1 Low   PT General Charges $$ ACUTE PT VISIT: 1 Visit           Tanda Falter, PT Acute Rehabilitation  Office: [phone]

## 2023-08-05 NOTE — Plan of Care (Signed)

## 2023-08-06 LAB — CULTURE, BLOOD (ROUTINE X 2)
Culture: NO GROWTH
Culture: NO GROWTH
Special Requests: ADEQUATE

## 2023-08-08 NOTE — Progress Notes (Unsigned)
 08/09/2023 Jacqueline Hancock [id number] 20-May-1940  Referring provider: Estil Heman, NP Primary GI doctor: Dr. Yvone Herd  ASSESSMENT AND PLAN:  Dysphagia x 2-3 months, worsening since her hospital visit in April, dneies GERD, white stuff on her tongue x 2-3 days Small type I hiatal hernia seen on PET scan 07/2023 No NSAIDS, no ETOH Dark green stools, no melena -Obvious oral candidiasis, possible esophageal candidiasis, will treat empirically with diflucan renal dosing by decreasing by 50% -Will plan on barium swallow first to evaluate for stricture/mass, will schedule EGD at hospital if needed for further evaluation as patient is high risk -Add on omeprazole 20mg , break apart and put in food. Dicussed with patient had headache with nexium, willing to try the omeprazole, if she has issues will do pepcid -Patient provides understanding and gave verbal consent to proceed. -In the interim patient advised about swallowing precautions.  -Eat slowly, chew food well before swallowing.  -Drink liquids in between each bite to avoid food impaction.  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 Salomon Cree last seen in  the office 08/01/2023 Recent MRI with right perinephric mass status post biopsy on 08/04/2023 -Follow up oncology  Personal history of adenomatous polyps 05/23/2017 colonoscopy Dr. Andriette Keeling excellent prep, no polyps No plan for repeat at this time due to age  Patient Care Team: Ngetich, Elijio Guadeloupe, NP as PCP - General (Family Medicine) Lenton Rail, MD as Consulting Physician (Otolaryngology) Serafin Dames, MD as Consulting Physician (Gastroenterology) Coy Ditty, Adelaida Adie, MD (Inactive) as Consulting Physician (Obstetrics and Gynecology) Albert Huff, MD as Consulting Physician (Ophthalmology) Monika Annas, [facility] (Inactive) as Pharmacist (Pharmacist)  HISTORY OF PRESENT ILLNESS: 83 y.o. female with a past medical history listed below presents as a new patient for evaluation of GERD and hepatic steatosis. Patient previously [facility] last seen in the office 03/2019.  Discussed the use of AI scribe software for clinical note transcription with the patient, who gave verbal consent to proceed.  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 She has been under the care of Dr. Salomon Cree.  She has been experiencing reflux and trouble swallowing, described as a sensation of food being stuck in her chest, for several months. Her tongue is sore and has a white coating, which started a couple of days ago. She has difficulty eating due to the soreness and experiences pain when swallowing. No heartburn or reflux symptoms, but swallowing is painful.  She has a history of mantle cell lymphoma and non-Hodgkin's lymphoma diagnosed in December 2021. Recently, she was hospitalized, and an MRI showed an abnormality on the right renal area, for which a biopsy was performed, but results are pending.  Her stools are consistently dark green, and she has been on  iron supplements for many years. She denies any use of Aleve or ibuprofen due to kidney function concerns and does not consume alcohol.  She experiences swelling in her legs, which has worsened since her recent hospitalization. She also reports feeling short of breath since being discharged from the hospital. She uses a walker for mobility and has difficulty eating and drinking, often feeling full quickly and sometimes vomiting after drinking water.  She has not had an endoscopy in a long time but had a colonoscopy in 2019. She has not tried medications like Nexium or omeprazole for her swallowing issues.      She  reports that she has never smoked. She has never used smokeless tobacco. She reports that she does not drink alcohol and does not use drugs.  RELEVANT GI HISTORY, IMAGING AND LABS: Results   RADIOLOGY MRI: revealed a lesion on the right kidney      CBC    Component Value Date/Time   WBC 8.9 08/04/2023 0513   RBC 3.63 (L) 08/04/2023 0513   HGB 10.2 (L) 08/04/2023 0513   HGB 10.8 (L) 07/17/2023 0912   HCT 34.8 (L) 08/04/2023 0513   PLT 238 08/04/2023 0513   PLT 226 07/17/2023 0912   MCV 95.9 08/04/2023 0513   MCH 28.1 08/04/2023 0513   MCHC 29.3 (L) 08/04/2023 0513   RDW 12.9 08/04/2023 0513   LYMPHSABS 1.6 08/01/2023 1412   MONOABS 1.2 (H) 08/01/2023 1412   EOSABS 0.1 08/01/2023 1412   BASOSABS 0.0 08/01/2023 1412   Recent Labs    06/17/23 0632 06/18/23 0715 06/28/23 1148 07/12/23 0857 07/17/23 0912 07/30/23 1803 08/01/23 1412 08/02/23 0526 08/03/23 0524 08/04/23 0513  HGB 11.1* 10.7* 11.4* 11.8* 10.8* 12.0 10.9* 10.0* 9.7* 10.2*    CMP     Component Value Date/Time   NA 138 08/04/2023 0509   K 4.5 08/04/2023 0509   CL 110 08/04/2023 0509   CO2 17 (L) 08/04/2023 0509   GLUCOSE 90 08/04/2023 0509   BUN 43 (H) 08/04/2023 0509   CREATININE 2.14 (H) 08/04/2023 0509   CREATININE 1.58 (H) 07/17/2023 0912   CREATININE 1.27 (H) 06/28/2023 1148   CALCIUM  10.2 08/04/2023 0509   CALCIUM 12.8 (H) 06/15/2023 0916   PROT 5.8 (L) 08/03/2023 0524   ALBUMIN 2.6 (L) 08/03/2023 0524   AST 27 08/03/2023 0524   AST 27 07/17/2023 0912   ALT 23 08/03/2023 0524   ALT 18 07/17/2023 0912   ALKPHOS 50 08/03/2023 0524   BILITOT 0.6 08/03/2023 0524   BILITOT 1.2 07/17/2023 0912   GFRNONAA 22 (L) 08/04/2023 0509   GFRNONAA 32 (L) 07/17/2023 0912   GFRNONAA 45 (L) 08/07/2020 1115   GFRAA 52 (L) 08/07/2020 1115      Latest Ref Rng & Units 08/03/2023    5:24 AM 08/01/2023    2:12 PM 07/30/2023    6:03 PM  Hepatic Function  Total Protein 6.5 - 8.1 g/dL 5.8  6.8  6.8   Albumin 3.5 - 5.0 g/dL 2.6  3.4  3.4   AST 15 - 41 U/L 27  36  45   ALT 0 - 44 U/L 23  33  37   Alk Phosphatase 38 - 126 U/L 50  58  56   Total Bilirubin 0.0 - 1.2 mg/dL 0.6  1.2  1.1       Current Medications:    Current Outpatient Medications (Cardiovascular):  amLODipine  (NORVASC ) 5 MG tablet, Take 1 tablet (5 mg total) by mouth daily.   nadolol  (CORGARD ) 20 MG tablet, TAKE 1 TABLET BY MOUTH DAILY FOR BLOOD PRESSURE OR TREMORS (Patient taking differently: Take 20 mg by mouth 2 (two) times daily. TAKE 1 TABLET BY MOUTH DAILY FOR BLOOD PRESSURE OR TREMORS)  Current Outpatient Medications (Respiratory):    Loratadine (CLARITIN PO), Take by mouth daily.  Current Outpatient Medications (Analgesics):    traMADol  (ULTRAM ) 50 MG tablet, Take 1 tablet (50 mg total) by mouth every 8 (eight) hours as needed for moderate pain (pain score 4-6) or severe pain (pain score 7-10) (pain).  Current Outpatient Medications (Hematological):    Ferrous Sulfate  (IRON) 325 (65 Fe) MG TABS, Take 325 mg by mouth daily.   Current Outpatient Medications (Other):    B Complex Vitamins (VITAMIN B-COMPLEX PO), Take 1 tablet by mouth daily.   fluconazole (DIFLUCAN) 100 MG tablet, Take 2 tablets (200 mg total) by mouth daily for 1 day, THEN 1 tablet (100 mg total) daily for 10 days.   nystatin  (MYCOSTATIN )  100000 UNIT/ML suspension, Take 5 mLs (500,000 Units total) by mouth 4 (four) times daily.   omeprazole (PRILOSEC) 20 MG capsule, Open capsule and sprinkle on food once daily   polyethylene glycol (MIRALAX  / GLYCOLAX ) 17 g packet, Take 17 g by mouth daily as needed for mild constipation or moderate constipation. Also available OTC   senna-docusate (SENOKOT-S) 8.6-50 MG tablet, Take 2 tablets by mouth at bedtime. For constipation  Medical History:  Past Medical History:  Diagnosis Date   CLL (chronic lymphocytic leukemia) (HCC)    Dyspnea 2021   with exersion    Early cataracts, bilateral    Elevated hemoglobin A1c    Fibroids    GERD (gastroesophageal reflux disease)    Hyperlipidemia    Hypertension    Lymphoma (HCC)    Osteopenia    Allergies:  Allergies  Allergen Reactions   Nexium [Esomeprazole Magnesium] Other (See Comments)    Headache   Peanut Butter Flavoring Agent (Non-Screening) Itching     Surgical History:  She  has a past surgical history that includes Parotid gland tumor excision (2012); Abdominal hysterectomy; Appendectomy; Tonsillectomy and adenoidectomy; and Cataract extraction, bilateral (Bilateral, 09/2017). Family History:  Her family history includes Diabetes in her brother, brother, and sister; Heart disease in her brother and son; Hypertension in her father and son; Leukemia in her mother.  REVIEW OF SYSTEMS  : All other systems reviewed and negative except where noted in the History of Present Illness.  PHYSICAL EXAM: BP 124/60 (BP Location: Left Arm, Patient Position: Sitting, Cuff Size: Normal)   Pulse 88   Ht 4\' 10"  (1.473 m) Comment: height measured without shoes  Wt 146 lb 4 oz (66.3 kg)   BMI 30.57 kg/m  Physical Exam   GENERAL APPEARANCE: Well nourished, in no apparent distress. HEENT: Oral candidiasis on tongue and likely in the back of the mouth. No cervical lymphadenopathy, unremarkable thyroid , sclerae anicteric, conjunctiva  pink. RESPIRATORY: Respiratory effort normal, breath sounds equal bilaterally without rales, rhonchi, or wheezing. CARDIO: Regular rate and rhythm with no murmurs, rubs, or gallops, peripheral pulses intact. ABDOMEN: Abdominal distension with possible fluid wave, fullness/mass appreciated on lower AB, Soft, active bowel sounds in all 4 quadrants, no tenderness to palpation, no rebound RECTAL: Declines. MUSCULOSKELETAL: Full range of motion, normal gait, without edema. SKIN: Dry, intact without rashes or lesions. No jaundice. NEURO: Alert, oriented, no focal deficits. PSYCH:  Cooperative, normal mood and affect. EXTREMITIES: 2-3+ edema in extremities, right leg larger than left, negative homen's, no warmth, redness.      Edmonia Gottron, PA-C 10:25 AM

## 2023-08-09 ENCOUNTER — Encounter: Payer: Self-pay | Admitting: Physician Assistant

## 2023-08-09 ENCOUNTER — Emergency Department (HOSPITAL_COMMUNITY)

## 2023-08-09 ENCOUNTER — Other Ambulatory Visit: Payer: Self-pay

## 2023-08-09 ENCOUNTER — Encounter (HOSPITAL_COMMUNITY): Payer: Self-pay

## 2023-08-09 ENCOUNTER — Ambulatory Visit: Admitting: Physician Assistant

## 2023-08-09 ENCOUNTER — Emergency Department (HOSPITAL_COMMUNITY)
Admission: EM | Admit: 2023-08-09 | Discharge: 2023-08-10 | Disposition: A | Discharge status: Home / Self Care | Attending: Emergency Medicine | Admitting: Emergency Medicine

## 2023-08-09 VITALS — BP 124/60 | HR 88 | Ht <= 58 in | Wt 146.2 lb

## 2023-08-09 DIAGNOSIS — Z8619 Personal history of other infectious and parasitic diseases: Secondary | ICD-10-CM

## 2023-08-09 DIAGNOSIS — R1310 Dysphagia, unspecified: Secondary | ICD-10-CM

## 2023-08-09 DIAGNOSIS — N189 Chronic kidney disease, unspecified: Secondary | ICD-10-CM | POA: Insufficient documentation

## 2023-08-09 DIAGNOSIS — B3781 Candidal esophagitis: Secondary | ICD-10-CM

## 2023-08-09 DIAGNOSIS — R7989 Other specified abnormal findings of blood chemistry: Secondary | ICD-10-CM

## 2023-08-09 DIAGNOSIS — R600 Localized edema: Secondary | ICD-10-CM | POA: Diagnosis not present

## 2023-08-09 DIAGNOSIS — D649 Anemia, unspecified: Secondary | ICD-10-CM | POA: Diagnosis not present

## 2023-08-09 DIAGNOSIS — R748 Abnormal levels of other serum enzymes: Secondary | ICD-10-CM | POA: Diagnosis not present

## 2023-08-09 DIAGNOSIS — Z79899 Other long term (current) drug therapy: Secondary | ICD-10-CM | POA: Insufficient documentation

## 2023-08-09 DIAGNOSIS — K760 Fatty (change of) liver, not elsewhere classified: Secondary | ICD-10-CM

## 2023-08-09 DIAGNOSIS — D72829 Elevated white blood cell count, unspecified: Secondary | ICD-10-CM | POA: Insufficient documentation

## 2023-08-09 DIAGNOSIS — R1900 Intra-abdominal and pelvic swelling, mass and lump, unspecified site: Secondary | ICD-10-CM

## 2023-08-09 DIAGNOSIS — R140 Abdominal distension (gaseous): Secondary | ICD-10-CM | POA: Insufficient documentation

## 2023-08-09 DIAGNOSIS — Z91010 Allergy to peanuts: Secondary | ICD-10-CM | POA: Insufficient documentation

## 2023-08-09 DIAGNOSIS — J181 Lobar pneumonia, unspecified organism: Secondary | ICD-10-CM | POA: Diagnosis not present

## 2023-08-09 DIAGNOSIS — I129 Hypertensive chronic kidney disease with stage 1 through stage 4 chronic kidney disease, or unspecified chronic kidney disease: Secondary | ICD-10-CM | POA: Diagnosis not present

## 2023-08-09 DIAGNOSIS — Z860101 Personal history of adenomatous and serrated colon polyps: Secondary | ICD-10-CM

## 2023-08-09 DIAGNOSIS — R601 Generalized edema: Secondary | ICD-10-CM

## 2023-08-09 DIAGNOSIS — M7989 Other specified soft tissue disorders: Secondary | ICD-10-CM | POA: Diagnosis not present

## 2023-08-09 DIAGNOSIS — J189 Pneumonia, unspecified organism: Secondary | ICD-10-CM

## 2023-08-09 DIAGNOSIS — R1319 Other dysphagia: Secondary | ICD-10-CM

## 2023-08-09 LAB — COMPREHENSIVE METABOLIC PANEL WITH GFR
ALT: 19 U/L (ref 0–44)
AST: 26 U/L (ref 15–41)
Albumin: 2.8 g/dL — ABNORMAL LOW (ref 3.5–5.0)
Alkaline Phosphatase: 52 U/L (ref 38–126)
Anion gap: 12 (ref 5–15)
BUN: 52 mg/dL — ABNORMAL HIGH (ref 8–23)
CO2: 16 mmol/L — ABNORMAL LOW (ref 22–32)
Calcium: 10.3 mg/dL (ref 8.9–10.3)
Chloride: 109 mmol/L (ref 98–111)
Creatinine, Ser: 2.34 mg/dL — ABNORMAL HIGH (ref 0.44–1.00)
GFR, Estimated: 20 mL/min — ABNORMAL LOW (ref >60)
Glucose, Bld: 122 mg/dL — ABNORMAL HIGH (ref 70–99)
Potassium: 4.3 mmol/L (ref 3.5–5.1)
Sodium: 137 mmol/L (ref 135–145)
Total Bilirubin: 0.9 mg/dL (ref 0.0–1.2)
Total Protein: 6 g/dL — ABNORMAL LOW (ref 6.5–8.1)

## 2023-08-09 LAB — BRAIN NATRIURETIC PEPTIDE: B Natriuretic Peptide: 38.2 pg/mL (ref 0.0–100.0)

## 2023-08-09 LAB — SURGICAL PATHOLOGY

## 2023-08-09 LAB — CBC
HCT: 32.6 % — ABNORMAL LOW (ref 36.0–46.0)
Hemoglobin: 10 g/dL — ABNORMAL LOW (ref 12.0–15.0)
MCH: 27.9 pg (ref 26.0–34.0)
MCHC: 30.7 g/dL (ref 30.0–36.0)
MCV: 90.8 fL (ref 80.0–100.0)
Platelets: 258 10*3/uL (ref 150–400)
RBC: 3.59 MIL/uL — ABNORMAL LOW (ref 3.87–5.11)
RDW: 13.2 % (ref 11.5–15.5)
WBC: 11 10*3/uL — ABNORMAL HIGH (ref 4.0–10.5)
nRBC: 0 % (ref 0.0–0.2)

## 2023-08-09 LAB — LIPASE, BLOOD: Lipase: 88 U/L — ABNORMAL HIGH (ref 11–51)

## 2023-08-09 MED ORDER — NYSTATIN 100000 UNIT/ML MT SUSP: 5.0000 mL | Freq: Four times a day (QID) | OROMUCOSAL | 1 refills | Status: DC

## 2023-08-09 MED ORDER — FLUCONAZOLE 100 MG PO TABS: ORAL_TABLET | ORAL | 0 refills | Status: AC

## 2023-08-09 MED ORDER — AZITHROMYCIN 250 MG PO TABS: 500.0000 mg | ORAL_TABLET | Freq: Every day | ORAL | Status: DC

## 2023-08-09 MED ORDER — OMEPRAZOLE 20 MG PO CPDR: DELAYED_RELEASE_CAPSULE | ORAL | 3 refills | Status: DC

## 2023-08-09 MED ORDER — AZITHROMYCIN 250 MG PO TABS: 250.0000 mg | ORAL_TABLET | Freq: Every day | ORAL | 0 refills | Status: DC

## 2023-08-09 MED ORDER — AMOXICILLIN-POT CLAVULANATE 875-125 MG PO TABS: 1.0000 | ORAL_TABLET | Freq: Two times a day (BID) | ORAL | 0 refills | Status: DC

## 2023-08-09 MED ORDER — AMOXICILLIN-POT CLAVULANATE 875-125 MG PO TABS
1.0000 | ORAL_TABLET | Freq: Once | ORAL | Status: AC
  Administered 2023-08-09: 1 via ORAL
  Filled 2023-08-09: qty 1

## 2023-08-09 NOTE — ED Triage Notes (Signed)
 Pt here for abd swelling and has hx of kidney failure and noticed swelling since Saturday. Pt has been urinating less. C/O shob

## 2023-08-09 NOTE — Patient Instructions (Signed)
 _______________________________________________________  If your blood pressure at your visit was 140/90 or greater, please contact your primary care physician to follow up on this. _______________________________________________________  If you are age 83 or older, your body mass index should be between 23-30. Your Body mass index is 30.57 kg/m. If this is out of the aforementioned range listed, please consider follow up with your Primary Care Provider. ________________________________________________________  The [facility] providers would like to encourage you to use MYCHART to communicate with providers for non-urgent requests or questions.  Due to long hold times on the telephone, sending your provider a message by [facility] may be a faster and more efficient way to get a response.  Please allow 48 business hours for a response.  Please remember that this is for non-urgent requests.  _______________________________________________________  We have sent the following medications to your pharmacy for you to pick up at your convenience:  START: fluconazole 100mg  Take 2 tablets (200 mg total) by mouth daily for 1 day, THEN 1 tablet (100 mg total) daily for 10 days  START: Nystatin  suspension 100000 unit/ML Take 5 mLs (500,000 Units total) by mouth 4 (four) times daily  START: omeprazole 20mg     Open capsule and sprinkle on food once daily       You have been scheduled for a Barium Esophogram at [facility] (1st floor of the hospital) on 08-18-23 at 9:30am. Please arrive 30 minutes prior to your appointment for registration. Make certain not to have anything to eat or drink 3 hours prior to your test. If you need to reschedule for any reason, please contact radiology at [phone] to do so. __________________________________________________________________ A barium swallow is an examination that concentrates on views of the esophagus. This tends to be a double contrast exam (barium  and two liquids which, when combined, create a gas to distend the wall of the oesophagus) or single contrast (non-ionic iodine based). The study is usually tailored to your symptoms so a good history is essential. Attention is paid during the study to the form, structure and configuration of the esophagus, looking for functional disorders (such as aspiration, dysphagia, achalasia, motility and reflux)  EXAMINATION  You may be asked to change into a gown, depending on the type of swallow being performed. A radiologist and radiographer will perform the procedure. The radiologist will advise you of the type of contrast selected for your procedure and direct you during the exam. You will be asked to stand, sit or lie in several different positions and to hold a small amount of fluid in your mouth before being asked to swallow while the imaging is performed .In some instances you may be asked to swallow barium coated marshmallows to assess the motility of a solid food bolus.  The exam can be recorded as a digital or video fluoroscopy procedure.  POST PROCEDURE  It will take 1-2 days for the barium to pass through your system. To facilitate this, it is important, unless otherwise directed, to increase your fluids for the next 24-48hrs and to resume your normal diet.   This test typically takes about 30 minutes to perform. __________________________________________________________________________________  Due to recent changes in healthcare laws, you may see the results of your imaging and laboratory studies on MyChart before your provider has had a chance to review them.  We understand that in some cases there may be results that are confusing or concerning to you. Not all laboratory results come back in the same time frame and the  provider may be waiting for multiple results in order to interpret others.  Please give us  48 hours in order for your provider to thoroughly review all the results before  contacting the office for clarification of your results.   Thank you for entrusting me with your care and choosing [facility].  Santina Cull, PA-C

## 2023-08-09 NOTE — ED Provider Notes (Signed)
 [place] EMERGENCY DEPARTMENT AT [facility] Provider Note   CSN: [id number] Arrival date & time: 08/09/23  1743     History  Chief Complaint  Patient presents with   Abdominal Pain    Jacqueline Hancock is a 83 y.o. female.  Patient is an 83 year old female with a past medical history of CLL, hypertension, CKD, ILD presenting to the emergency department with abdominal swelling.  The patient reports that she was admitted to the hospital last week for an AKI and was found to have a mass on her left kidney.  She states that she is post to follow-up with her doctor tomorrow to go over the results.  She states since being home from the hospital on Saturday she has developed swelling in her abdomen that has increased over the last several days.  She denies any associated pain.  She states that she is also having swelling in her legs.  States that she feels short of breath.  Denies any chest pain.  Denies any fevers, nausea or vomiting. She reports normal urination to me.  The history is provided by the patient and a relative.  Abdominal Pain      Home Medications Prior to Admission medications   Medication Sig Start Date End Date Taking? Authorizing Provider  amoxicillin -clavulanate (AUGMENTIN ) 875-125 MG tablet Take 1 tablet by mouth every 12 (twelve) hours. 08/09/23  Yes Nora Beal, Bently Wyss K, DO  azithromycin  (ZITHROMAX ) 250 MG tablet Take 1 tablet (250 mg total) by mouth daily. Starting tomorrow, take 1 every day until finished. 08/09/23  Yes Nora Beal, Amberli Ruegg K, DO  amLODipine  (NORVASC ) 5 MG tablet Take 1 tablet (5 mg total) by mouth daily. 06/14/23   Ngetich, Dinah C, NP  B Complex Vitamins (VITAMIN B-COMPLEX PO) Take 1 tablet by mouth daily.    [provider]  Ferrous Sulfate  (IRON) 325 (65 Fe) MG TABS Take 325 mg by mouth daily.     [provider]  fluconazole (DIFLUCAN) 100 MG tablet Take 2 tablets (200 mg total) by mouth daily for 1 day, THEN 1 tablet (100 mg  total) daily for 10 days. 08/09/23 08/20/23  Edmonia Gottron, PA-C  Loratadine (CLARITIN PO) Take by mouth daily.    [provider]  nadolol  (CORGARD ) 20 MG tablet TAKE 1 TABLET BY MOUTH DAILY FOR BLOOD PRESSURE OR TREMORS Patient taking differently: Take 20 mg by mouth 2 (two) times daily. TAKE 1 TABLET BY MOUTH DAILY FOR BLOOD PRESSURE OR TREMORS 07/27/23   Ngetich, Dinah C, NP  nystatin  (MYCOSTATIN ) 100000 UNIT/ML suspension Take 5 mLs (500,000 Units total) by mouth 4 (four) times daily. 08/09/23   Edmonia Gottron, PA-C  omeprazole (PRILOSEC) 20 MG capsule Open capsule and sprinkle on food once daily 08/09/23   Collier, Amanda R, PA-C  polyethylene glycol (MIRALAX  / GLYCOLAX ) 17 g packet Take 17 g by mouth daily as needed for mild constipation or moderate constipation. Also available OTC 06/18/23   Rai, Ripudeep K, MD  senna-docusate (SENOKOT-S) 8.6-50 MG tablet Take 2 tablets by mouth at bedtime. For constipation 06/18/23   Rai, Hurman Maiden, MD  traMADol  (ULTRAM ) 50 MG tablet Take 1 tablet (50 mg total) by mouth every 8 (eight) hours as needed for moderate pain (pain score 4-6) or severe pain (pain score 7-10) (pain). 06/18/23   Rai, Hurman Maiden, MD  gabapentin  (NEURONTIN ) 100 MG capsule Take 1 capsule 3 x /day for Neuropathy Pain Patient not taking: Reported on 01/25/2020 08/02/19 01/25/20  Vangie Genet, MD      Allergies    Nexium [esomeprazole magnesium] and Peanut butter flavoring agent (non-screening)    Review of Systems   Review of Systems  Gastrointestinal:  Positive for abdominal pain.    Physical Exam Updated Vital Signs BP (!) 156/60   Pulse 83   Temp 98.4 F (36.9 C) (Oral)   Resp 17   Ht 4\' 11"  (1.499 m)   Wt 66.2 kg   SpO2 100%   BMI 29.49 kg/m  Physical Exam Vitals and nursing note reviewed.  Constitutional:      General: She is not in acute distress.    Appearance: She is well-developed.  HENT:     Head: Normocephalic and atraumatic.     Mouth/Throat:      Mouth: Mucous membranes are moist.  Eyes:     Extraocular Movements: Extraocular movements intact.  Cardiovascular:     Rate and Rhythm: Normal rate and regular rhythm.     Heart sounds: Normal heart sounds.  Pulmonary:     Effort: Pulmonary effort is normal.     Breath sounds: Normal breath sounds.  Abdominal:     General: There is distension.     Palpations: Abdomen is soft.     Tenderness: There is no abdominal tenderness.  Musculoskeletal:     Right lower leg: 1+ Pitting Edema present.     Left lower leg: 1+ Pitting Edema present.  Skin:    General: Skin is warm and dry.  Neurological:     General: No focal deficit present.     Mental Status: She is alert and oriented to person, place, and time.  Psychiatric:        Mood and Affect: Mood normal.        Behavior: Behavior normal.     ED Results / Procedures / Treatments   Labs (all labs ordered are listed, but only abnormal results are displayed) Labs Reviewed  LIPASE, BLOOD - Abnormal; Notable for the following components:      Result Value   Lipase 88 (*)    All other components within normal limits  COMPREHENSIVE METABOLIC PANEL WITH GFR - Abnormal; Notable for the following components:   CO2 16 (*)    Glucose, Bld 122 (*)    BUN 52 (*)    Creatinine, Ser 2.34 (*)    Total Protein 6.0 (*)    Albumin 2.8 (*)    GFR, Estimated 20 (*)    All other components within normal limits  CBC - Abnormal; Notable for the following components:   WBC 11.0 (*)    RBC 3.59 (*)    Hemoglobin 10.0 (*)    HCT 32.6 (*)    All other components within normal limits  BRAIN NATRIURETIC PEPTIDE  URINALYSIS, ROUTINE W REFLEX MICROSCOPIC    EKG None  Radiology CT ABDOMEN PELVIS WO CONTRAST Result Date: 08/09/2023 CLINICAL DATA:  Abdominal pain no known history of lymphoma and recently positive biopsy EXAM: CT ABDOMEN AND PELVIS WITHOUT CONTRAST TECHNIQUE: Multidetector CT imaging of the abdomen and pelvis was performed  following the standard protocol without IV contrast. RADIATION DOSE REDUCTION: This exam was performed according to the departmental dose-optimization program which includes automated exposure control, adjustment of the mA and/or kV according to patient size and/or use of iterative reconstruction technique. COMPARISON:  08/01/2023 FINDINGS: Lower chest: Previously seen patchy ground-glass opacities are again seen but improved when compared with the prior study. Hepatobiliary: Persistent perihepatic fluid is  noted. The gallbladder is within normal limits. Pancreas: Unremarkable. No pancreatic ductal dilatation or surrounding inflammatory changes. Spleen: Normal in size without focal abnormality. Mild perisplenic fluid is noted. Adrenals/Urinary Tract: Left adrenal gland is within normal limits. Left kidney shows no acute abnormality. No obstructive changes are seen.The right kidney is envelop by a large soft tissue mass stable in appearance from the recent exam as well as a recent MRI. 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 The surrounding soft tissues extend inferiorly similar to that seen on the prior exam. The bladder is decompressed. Stomach/Bowel: No obstructive or inflammatory changes of the colon are seen. The appendix is not well visualized. Small bowel and stomach are unremarkable. Vascular/Lymphatic: Aortic atherosclerosis. No enlarged abdominal or pelvic lymph nodes. Reproductive: Uterus has been surgically removed. Other: No abdominal wall hernia or abnormality. No abdominopelvic ascites. Musculoskeletal: No acute bony abnormality is noted. IMPRESSION: Stable appearing soft tissue mass lesion in the right abdomen envelop ping the right kidney with mild hydronephrosis as well as extending superiorly towards the liver and inferiorly into the pelvis. 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 Mild free fluid  is noted within the abdomen stable from the prior study. No new focal abnormality is seen. Electronically Signed   By: Violeta Grey M.D.   On: 08/09/2023 22:51   DG Chest 2 View Result Date: 08/09/2023 CLINICAL DATA:  Shortness of breath EXAM: CHEST - 2 VIEW COMPARISON:  08/01/2023 FINDINGS: Heart mediastinal contours within normal limits. Questionable ill-defined patchy lower lobe opacities. No visible effusions or pneumothorax. No acute bony abnormality. IMPRESSION: Questionable vague lower lobe opacities, possible atelectasis or pneumonia. Electronically Signed   By: Janeece Mechanic M.D.   On: 08/09/2023 21:14    Procedures Procedures    Medications Ordered in ED Medications  amoxicillin -clavulanate (AUGMENTIN ) 875-125 MG per tablet 1 tablet (has no administration in time range)  azithromycin  (ZITHROMAX ) tablet 500 mg (has no administration in time range)    ED Course/ Medical Decision Making/ A&P Clinical Course as of 08/09/23 2317  Wed Aug 09, 2023  2308 Stable appearing abdominal mass and free fluid in the abdomen. 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 She is recommended to follow up outpatient tomorrow as scheduled. [VK]  2313 CXR with atelectasis vs pneumonia. Patient reports she does have a cough and has occasionally felt feverish. Will treat with antibiotics. She is otherwise stable for discharge home. [VK]    Clinical Course User Index [VK] Kingsley, Dawn Convery K, DO                                 Medical Decision Making This patient presents to the ED with chief complaint(s) of abd swelling with pertinent past medical history of CLL, CKD, HTN which further complicates the presenting complaint. The complaint involves an extensive differential diagnosis and also carries with it a high risk of complications and morbidity.    The differential diagnosis includes ascites, hepatic renal syndrome, malignancy, CHF, dehydration, electrolyte abnormality, no point abdominal tenderness making  infection less likely  Additional history obtained: Additional history obtained from family Records reviewed previous admission documents and outpatient GI and oncology records  ED Course and Reassessment: On patient's arrival she is hemodynamically stable in no acute distress.  Initially had labs performed in triage.  Patient's creatinine is at her baseline, and mildly elevated lipase, no epigastric tenderness  making pancreatitis less likely.  Her abdomen does feel distended and does have 1+ edema of the bilateral lower extremities.  Will have CT to evaluate for worsening malignancy or ascites as well as chest x-ray to evaluate for possible pulmonary edema or effusion and she will be closely reassessed.  She is not on cirrhotic making SBP unlikely.  Independent labs interpretation:  The following labs were independently interpreted: mild leukocytosis and mildly elevated lactic, otherwise at baseline  Independent visualization of imaging: - I independently visualized the following imaging with scope of interpretation limited to determining acute life threatening conditions related to emergency care: CXR, CTAP, which revealed possible bilateral LE pneumonia, otherwise stable appearance of abdominal mass and fluid  Consultation: - Consulted or discussed management/test interpretation w/ external professional: N/A  Consideration for admission or further workup: Patient has no emergent conditions requiring admission or further work-up at this time and is stable for discharge home with primary care and oncology follow-up  Social Determinants of health: N/A    Amount and/or Complexity of Data Reviewed Radiology: ordered.  Risk Prescription drug management.          Final Clinical Impression(s) / ED Diagnoses Final diagnoses:  Pneumonia of both lower lobes due to infectious organism  Abdominal mass, unspecified abdominal location  Leg swelling    Rx / DC Orders ED Discharge  Orders          Ordered    amoxicillin -clavulanate (AUGMENTIN ) 875-125 MG tablet  Every 12 hours        08/09/23 2316    azithromycin  (ZITHROMAX ) 250 MG tablet  Daily        08/09/23 2316              Kingsley, Inger Wiest K, DO 08/09/23 2317

## 2023-08-09 NOTE — Discharge Instructions (Signed)
 You were seen in the emergency department for your swelling and shortness of breath.  Your chest x-ray shows that you likely have pneumonia and I have given you a course of antibiotics you should complete this as prescribed.  Your CAT scan otherwise showed that your abdominal mass appears stable in size and you have no significant increase of fluid in your belly.  You can follow-up with your doctor tomorrow as scheduled to have your symptoms rechecked and for further management.  You should return to the emergency department if you having fevers despite the antibiotics, severe abdominal pain, repetitive vomiting or any other new or concerning symptoms.

## 2023-08-10 ENCOUNTER — Telehealth: Payer: Self-pay

## 2023-08-10 NOTE — Telephone Encounter (Signed)
 noted

## 2023-08-10 NOTE — Telephone Encounter (Signed)
 Copied from CRM [id number]. Topic: Clinical - Home Health Verbal Orders >> Aug 09, 2023  5:00 PM Maryln Sober wrote: Caller/Agency: Fredirick Jasmine Callback Number: [phone] Service Requested: Physical Therapy Frequency: Patient requested resumption care on 5/14  Any new concerns about the patient? No  Left a very detail voicemail on a secure line to give Home Health Verbal Orders for patient to have physical Therapy FYI through [facility].  Ngetich, Elijio Guadeloupe, NP has been notified   Message sent to [employer], Elijio Guadeloupe, NP

## 2023-08-11 ENCOUNTER — Telehealth: Payer: Self-pay

## 2023-08-11 NOTE — Telephone Encounter (Signed)
-----   Message from Edmonia Gottron sent at 08/11/2023 12:21 PM EDT ----- Please have patient return for lab only to basement for liver function labs, they are in epic

## 2023-08-11 NOTE — Addendum Note (Signed)
 Addended by: Santina Cull on: 08/11/2023 12:22 PM   Modules accepted: Orders

## 2023-08-11 NOTE — Telephone Encounter (Signed)
 Left message on voicemail for patient to return to our office for lab work.  Will also send patient a message via MyChart as a reminder.

## 2023-08-14 ENCOUNTER — Telehealth: Payer: Self-pay | Admitting: *Deleted

## 2023-08-14 NOTE — Telephone Encounter (Signed)
 Returned PC to patient's son, [name] - he called early asking if Dr Salomon Cree had patient's bx results.  Per Dr Salomon Cree, he does have the results & would like to see the patient 08/16/23 at 2:00.  Edwena Graham understanding, appointment scheduled.

## 2023-08-15 ENCOUNTER — Encounter (HOSPITAL_COMMUNITY): Payer: Self-pay | Admitting: Pharmacy Technician

## 2023-08-15 ENCOUNTER — Ambulatory Visit: Payer: Medicare Other | Admitting: Nurse Practitioner

## 2023-08-15 ENCOUNTER — Emergency Department (HOSPITAL_COMMUNITY)
Admission: EM | Admit: 2023-08-15 | Discharge: 2023-08-15 | Disposition: A | Discharge status: Home / Self Care | Attending: Emergency Medicine | Admitting: Emergency Medicine

## 2023-08-15 ENCOUNTER — Other Ambulatory Visit: Payer: Self-pay

## 2023-08-15 ENCOUNTER — Ambulatory Visit: Admitting: Family

## 2023-08-15 ENCOUNTER — Emergency Department (HOSPITAL_COMMUNITY)

## 2023-08-15 DIAGNOSIS — E860 Dehydration: Secondary | ICD-10-CM | POA: Diagnosis not present

## 2023-08-15 DIAGNOSIS — Z91010 Allergy to peanuts: Secondary | ICD-10-CM | POA: Diagnosis not present

## 2023-08-15 DIAGNOSIS — R1901 Right upper quadrant abdominal swelling, mass and lump: Secondary | ICD-10-CM | POA: Diagnosis not present

## 2023-08-15 DIAGNOSIS — R1011 Right upper quadrant pain: Secondary | ICD-10-CM | POA: Diagnosis present

## 2023-08-15 LAB — CBC
HCT: 34.2 % — ABNORMAL LOW (ref 36.0–46.0)
Hemoglobin: 10.3 g/dL — ABNORMAL LOW (ref 12.0–15.0)
MCH: 28.3 pg (ref 26.0–34.0)
MCHC: 30.1 g/dL (ref 30.0–36.0)
MCV: 94 fL (ref 80.0–100.0)
Platelets: 244 10*3/uL (ref 150–400)
RBC: 3.64 MIL/uL — ABNORMAL LOW (ref 3.87–5.11)
RDW: 13.4 % (ref 11.5–15.5)
WBC: 8.6 10*3/uL (ref 4.0–10.5)
nRBC: 0 % (ref 0.0–0.2)

## 2023-08-15 LAB — URINALYSIS, ROUTINE W REFLEX MICROSCOPIC
Bilirubin Urine: NEGATIVE
Glucose, UA: NEGATIVE mg/dL
Hgb urine dipstick: NEGATIVE
Ketones, ur: NEGATIVE mg/dL
Nitrite: NEGATIVE
Protein, ur: 30 mg/dL — AB
Specific Gravity, Urine: 1.016 (ref 1.005–1.030)
pH: 5 (ref 5.0–8.0)

## 2023-08-15 LAB — COMPREHENSIVE METABOLIC PANEL WITH GFR
ALT: 16 U/L (ref 0–44)
AST: 21 U/L (ref 15–41)
Albumin: 2.8 g/dL — ABNORMAL LOW (ref 3.5–5.0)
Alkaline Phosphatase: 47 U/L (ref 38–126)
Anion gap: 14 (ref 5–15)
BUN: 40 mg/dL — ABNORMAL HIGH (ref 8–23)
CO2: 15 mmol/L — ABNORMAL LOW (ref 22–32)
Calcium: 9.9 mg/dL (ref 8.9–10.3)
Chloride: 106 mmol/L (ref 98–111)
Creatinine, Ser: 2.51 mg/dL — ABNORMAL HIGH (ref 0.44–1.00)
GFR, Estimated: 19 mL/min — ABNORMAL LOW (ref >60)
Glucose, Bld: 103 mg/dL — ABNORMAL HIGH (ref 70–99)
Potassium: 4.6 mmol/L (ref 3.5–5.1)
Sodium: 135 mmol/L (ref 135–145)
Total Bilirubin: 0.9 mg/dL (ref 0.0–1.2)
Total Protein: 6.2 g/dL — ABNORMAL LOW (ref 6.5–8.1)

## 2023-08-15 LAB — LIPASE, BLOOD: Lipase: 109 U/L — ABNORMAL HIGH (ref 11–51)

## 2023-08-15 MED ORDER — SODIUM CHLORIDE 0.9% FLUSH: 10.0000 mL | INTRAVENOUS | Status: DC | PRN

## 2023-08-15 MED ORDER — SODIUM CHLORIDE 0.9 % IV BOLUS
1000.0000 mL | Freq: Once | INTRAVENOUS | Status: AC
  Administered 2023-08-15: 1000 mL via INTRAVENOUS

## 2023-08-15 MED ORDER — SODIUM CHLORIDE 0.9% FLUSH: 10.0000 mL | Freq: Two times a day (BID) | INTRAVENOUS | Status: DC

## 2023-08-15 NOTE — ED Triage Notes (Signed)
 Pt here POV abdominal pain, nausea X1 week, leg swelling up to abdomen along with generalized weakness.

## 2023-08-15 NOTE — Progress Notes (Signed)
 HEMATOLOGY/ONCOLOGY CLINIC NOTE  Date of Service: 08/16/2023  Patient Care Team: Ngetich, Elijio Guadeloupe, NP as PCP - General (Family Medicine) Lenton Rail, MD as Consulting Physician (Otolaryngology) Serafin Dames, MD as Consulting Physician (Gastroenterology) Coy Ditty, Adelaida Adie, MD (Inactive) as Consulting Physician (Obstetrics and Gynecology) Albert Huff, MD as Consulting Physician (Ophthalmology) Monika Annas, [facility] (Inactive) as Pharmacist (Pharmacist)  CHIEF COMPLAINTS/PURPOSE OF CONSULTATION:  F/u for CLL/SLL  Prior Therapy:  She is status post bone marrow biopsy completed on March 05, 2020.    Prednisone  60 mg daily in the January 2022 to treat her hemolytic anemia.  This was tapered off after complete hematological response.  HISTORY OF PRESENTING ILLNESS:   Jacqueline Hancock is a wonderful 83 y.o. female who is here for continued evaluation and management of NHL, unspecified body region. Patient was following up with Dr. Dirk Fredericks.   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 BM Bx Cyclin D1 IHC +ve byt FISH neg . She has presented with splenomegaly, anemia, and lymphocytosis. 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   Patient's current treatment is Imbruvica  280 mg. She was started on Imbruvica  560 mg on January 2022, which was then reduced to 280 mg in July 2023.   Patient was last seen by Dr. Dirk Fredericks on 02/10/2022 and she was doing well overall.   Patient reports she has been doing well overall since our last visit. She does complains of itchy skin rashes, mostly around the face and head, for around 2-3 weeks ago. Patient notes that she was started couple of new medications from other physicians, but she is unsure of the names. Patient notes that she was started on oxycodone around 3-4 weeks ago. She discontinued oxycodone.   She notes that there is redness around her face, which is  not new.   Patient is currently taking Imbruvica  280 mg as prescribed and has been tolerating it well without any severe toxicities.   She is currently taking Iron supplement, calcium supplement, and Vitamin-D supplement.   She denies fever, chills, night sweats, unexpected weight loss, back pain, abdominal pain, chest pain, or leg swelling. Patient does complain of occasional abdominal pain.   Patient notes she had a surgery near her right neck/ear area to remove an enlarged lymph node couple years ago. She complains of mild occasional pain near the site.  INTERVAL HISTORY:  Jacqueline Hancock is a wonderful 83 y.o. 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   Patient was last seen by me in clinic on 08/01/2023 and complained of fatigue, frequent urination at night, insomnia, SOB, persistent abdominal pain with severe left lower abdominal pain and mild right lower abdominal pain, increasing abdominal fullness and night sweats.   She presented to the ED on 08/15/2023 for right upper quadrant abdominal mass. (Rt perinephrhic mass)  Patient presents in a wheelchair during today's visit and is accompanied by her son and daughter.   She complains of abdominal pain. Patient takes Tramadol  only as needed for pain, which improves symptoms.   She reports sense of fullness due to abdominal mass. Patient's son reports that she has been feeling pressure to her esophagus.    She complains of difficulty swallowing issues and even notes inability to swallow water.   She reports leg/feet swelling. Patient reports that sometimes when she is sitting, she has a shooting pain from her leg. She denies any pain  in the leg otherwise. Her leg swelling is symmetric and denies any redness.  Patient complains of diarrhea, which have caused sleep disturbances. She is not on Mirolax or Senna. Patient denies constipation.   Her daughter suggests that patient generally has not been too eating well  or moving around much.   Patinet was seen by [facility] gastroenterolgy on 08/09/2023 and there was concern she had thrush. Patient was started on antifungal. There were plans to have an endoscopy after labwork, which she has not yet received.   She was not put on Prednisone  last time she was in the hospital.  She takes claritin as needed for allergies.   Her blood pressure in clinic today is noted to be 139/62 with HR 92.   MEDICAL HISTORY:  Past Medical History:  Diagnosis Date   CLL (chronic lymphocytic leukemia) (HCC)    Dyspnea 2021   with exersion    Early cataracts, bilateral    Elevated hemoglobin A1c    Fibroids    GERD (gastroesophageal reflux disease)    Hyperlipidemia    Hypertension    Lymphoma (HCC)    Osteopenia     SURGICAL HISTORY: Past Surgical History:  Procedure Laterality Date   ABDOMINAL HYSTERECTOMY     APPENDECTOMY     CATARACT EXTRACTION, BILATERAL Bilateral 09/2017   Dr. 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   TONSILLECTOMY AND ADENOIDECTOMY      SOCIAL HISTORY: Social History   Socioeconomic History   Marital status: Widowed    Spouse name: Not on file   Number of children: 3   Years of education: Not on file   Highest education level: Not on file  Occupational History   Occupation: retired  Tobacco Use   Smoking status: Never   Smokeless tobacco: Never  Vaping Use   Vaping status: Never Used  Substance and Sexual Activity   Alcohol use: No   Drug use: Never   Sexual activity: Not on file  Other Topics Concern   Not on file  Social History Narrative   Not on file   Social Drivers of Health   Financial Resource Strain: Not on file  Food Insecurity: No Food Insecurity (08/02/2023)   Hunger Vital Sign    Worried About Running Out of Food in the Last Year: Never true    Ran Out of Food in the Last Year: Never true  Transportation Needs: No Transportation Needs (08/02/2023)   PRAPARE - Scientist, research (physical sciences) (Medical): No    Lack of Transportation (Non-Medical): No  Physical Activity: Not on file  Stress: Not on file  Social Connections: Unknown (08/03/2023)   Social Connection and Isolation Panel [NHANES]    Frequency of Communication with Friends and Family: More than three times a week    Frequency of Social Gatherings with Friends and Family: Twice a week    Attends Religious Services: More than 4 times per year    Active Member of [employer] or Organizations: Yes    Attends Banker Meetings: More than 4 times per year    Marital Status: Patient unable to answer  Intimate Partner Violence: Not At Risk (08/02/2023)   Humiliation, Afraid, Rape, and Kick questionnaire    Fear of Current or Ex-Partner: No    Emotionally Abused: No    Physically Abused: No    Sexually Abused: No    FAMILY HISTORY: Family History  Problem Relation Age of  Onset   Leukemia Mother    Hypertension Father    Diabetes Sister    Heart disease Brother    Diabetes Brother    Diabetes Brother    Hypertension Son    Heart disease Son     ALLERGIES:  is allergic to nexium [esomeprazole magnesium] and peanut butter flavoring agent (non-screening).  MEDICATIONS:  No current facility-administered medications for this visit.   Current Outpatient Medications  Medication Sig Dispense Refill   amLODipine  (NORVASC ) 5 MG tablet Take 1 tablet (5 mg total) by mouth daily. 30 tablet 3   amoxicillin -clavulanate (AUGMENTIN ) 875-125 MG tablet Take 1 tablet by mouth every 12 (twelve) hours. 10 tablet 0   azithromycin  (ZITHROMAX ) 250 MG tablet Take 1 tablet (250 mg total) by mouth daily. Starting tomorrow, take 1 every day until finished. 4 tablet 0   B Complex Vitamins (VITAMIN B-COMPLEX PO) Take 1 tablet by mouth daily.     Ferrous Sulfate  (IRON) 325 (65 Fe) MG TABS Take 325 mg by mouth daily.      fluconazole  (DIFLUCAN ) 100 MG tablet Take 2 tablets (200 mg total) by mouth daily for 1 day, THEN 1  tablet (100 mg total) daily for 10 days. 12 tablet 0   Loratadine (CLARITIN PO) Take by mouth daily.     nadolol  (CORGARD ) 20 MG tablet TAKE 1 TABLET BY MOUTH DAILY FOR BLOOD PRESSURE OR TREMORS (Patient taking differently: Take 20 mg by mouth 2 (two) times daily. TAKE 1 TABLET BY MOUTH DAILY FOR BLOOD PRESSURE OR TREMORS) 90 tablet 3   nystatin  (MYCOSTATIN ) 100000 UNIT/ML suspension Take 5 mLs (500,000 Units total) by mouth 4 (four) times daily. 270 mL 1   omeprazole  (PRILOSEC) 20 MG capsule Open capsule and sprinkle on food once daily 30 capsule 3   polyethylene glycol (MIRALAX  / GLYCOLAX ) 17 g packet Take 17 g by mouth daily as needed for mild constipation or moderate constipation. Also available OTC 30 each 0   senna-docusate (SENOKOT-S) 8.6-50 MG tablet Take 2 tablets by mouth at bedtime. For constipation 60 tablet 1   traMADol  (ULTRAM ) 50 MG tablet Take 1 tablet (50 mg total) by mouth every 8 (eight) hours as needed for moderate pain (pain score 4-6) or severe pain (pain score 7-10) (pain). 20 tablet 0   Facility-Administered Medications Ordered in Other Visits  Medication Dose Route Frequency Provider Last Rate Last Admin   sodium chloride  flush (NS) 0.9 % injection 10-40 mL  10-40 mL Intracatheter Q12H Dorenda Gandy, MD       sodium chloride  flush (NS) 0.9 % injection 10-40 mL  10-40 mL Intracatheter PRN Dorenda Gandy, MD        REVIEW OF SYSTEMS:    10 Point review of Systems was done is negative except as noted above.   PHYSICAL EXAMINATION: ECOG PERFORMANCE STATUS: 2 - Symptomatic, <50% confined to bed  . Vitals:   08/16/23 1431  BP: 139/62  Pulse: 92  Resp: 18  Temp: (!) 97.5 F (36.4 C)  SpO2: 98%    Filed Weights   08/16/23 1431  Weight: 148 lb 6.4 oz (67.3 kg)    .Body mass index is 29.97 kg/m.   GENERAL:alert, in no acute distress and comfortable SKIN: no acute rashes, no significant lesions EYES: conjunctiva are pink and non-injected, sclera  anicteric OROPHARYNX: MMM, no exudates, no oropharyngeal erythema or ulceration NECK: supple, no JVD LYMPH:  no palpable lymphadenopathy in the cervical, axillary or inguinal regions LUNGS: clear to auscultation  b/l with normal respiratory effort HEART: regular rate & rhythm ABDOMEN:  normoactive bowel sounds , non tender, not distended. Extremity: no pedal edema PSYCH: alert & oriented x 3 with fluent speech NEURO: no focal motor/sensory deficits   LABORATORY DATA:  I have reviewed the data as listed  .    Latest Ref Rng & Units 08/15/2023    9:54 AM 08/09/2023    5:59 PM 08/04/2023    5:13 AM  CBC  WBC 4.0 - 10.5 K/uL 8.6  11.0  8.9   Hemoglobin 12.0 - 15.0 g/dL 16.1  09.6  04.5   Hematocrit 36.0 - 46.0 % 34.2  32.6  34.8   Platelets 150 - 400 K/uL 244  258  238    .    Latest Ref Rng & Units 08/15/2023    9:54 AM 08/09/2023    5:59 PM 08/04/2023    5:09 AM  CMP  Glucose 70 - 99 mg/dL 409  811  90   BUN 8 - 23 mg/dL 40  52  43   Creatinine 0.44 - 1.00 mg/dL 9.14  7.82  9.56   Sodium 135 - 145 mmol/L 135  137  138   Potassium 3.5 - 5.1 mmol/L 4.6  4.3  4.5   Chloride 98 - 111 mmol/L 106  109  110   CO2 22 - 32 mmol/L 15  16  17    Calcium 8.9 - 10.3 mg/dL 9.9  21.3  08.6   Total Protein 6.5 - 8.1 g/dL 6.2  6.0    Total Bilirubin 0.0 - 1.2 mg/dL 0.9  0.9    Alkaline Phos 38 - 126 U/L 47  52    AST 15 - 41 U/L 21  26    ALT 0 - 44 U/L 16  19     . Lab Results  Component Value Date   LDH 326 (H) 08/04/2023     PATHOLOGY Surgical Pathology CASE: [id number] PATIENT: Georgianne Huertas Bone Marrow Report     Clinical History: lymphocytosis, possible lymphoma     DIAGNOSIS:  BONE MARROW, ASPIRATE, CLOT, CORE: -Hypercellular bone marrow with extensive involvement by a B-cell lymphoproliferative disorder -See comment  PERIPHERAL BLOOD: -Macrocytic anemia -Marked lymphocytosis consistent with lymphoproliferative disorder  COMMENT:  There is prominent involvement  by a B-cell lymphoproliferative process associated with cyclin D1 positivity and partial expression of CD5.  The latter is primarily seen by flow cytometry.  The findings favor mantle cell lymphoma but correlation with cytogenetic and FISH studies is recommended.  MICROSCOPIC DESCRIPTION:  PERIPHERAL BLOOD SMEAR: The red blood cells display prominent anisocytosis with macrocytic and normocytic cells.  There is mild to moderate poikilocytosis with teardrop cells, elliptocytes.  There is moderate polychromasia.  The white blood cells are increased in number with lymphocytosis characterized by predominance of small to medium sized lymphoid cells displaying high nuclear cytoplasmic ratio, round to irregular nuclei, coarse chromatin and small to inconspicuous nucleoli. The platelets are normal in number.     RADIOGRAPHIC STUDIES: I have personally reviewed the radiological images as listed and agreed with the findings in the report. DG Chest 2 View Result Date: 08/15/2023 CLINICAL DATA:  Shortness of breath. EXAM: CHEST - 2 VIEW COMPARISON:  Aug 09, 2023. FINDINGS: The heart size and mediastinal contours are within normal limits. Both lungs are clear. The visualized skeletal structures are unremarkable. IMPRESSION: No active cardiopulmonary disease. Electronically Signed   By: Rosalene Colon M.D.   On: 08/15/2023 11:25  CT ABDOMEN PELVIS WO CONTRAST Result Date: 08/09/2023 CLINICAL DATA:  Abdominal pain no known history of lymphoma and recently positive biopsy EXAM: CT ABDOMEN AND PELVIS WITHOUT CONTRAST TECHNIQUE: Multidetector CT imaging of the abdomen and pelvis was performed following the standard protocol without IV contrast. RADIATION DOSE REDUCTION: This exam was performed according to the departmental dose-optimization program which includes automated exposure control, adjustment of the mA and/or kV according to patient size and/or use of iterative reconstruction technique. COMPARISON:   08/01/2023 FINDINGS: Lower chest: Previously seen patchy ground-glass opacities are again seen but improved when compared with the prior study. Hepatobiliary: Persistent perihepatic fluid is noted. The gallbladder is within normal limits. Pancreas: Unremarkable. No pancreatic ductal dilatation or surrounding inflammatory changes. Spleen: Normal in size without focal abnormality. Mild perisplenic fluid is noted. Adrenals/Urinary Tract: Left adrenal gland is within normal limits. Left kidney shows no acute abnormality. No obstructive changes are seen.The right kidney is envelop by a large soft tissue mass stable in appearance from the recent exam as well as a recent MRI. 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 The surrounding soft tissues extend inferiorly similar to that seen on the prior exam. The bladder is decompressed. Stomach/Bowel: No obstructive or inflammatory changes of the colon are seen. The appendix is not well visualized. Small bowel and stomach are unremarkable. Vascular/Lymphatic: Aortic atherosclerosis. No enlarged abdominal or pelvic lymph nodes. Reproductive: Uterus has been surgically removed. Other: No abdominal wall hernia or abnormality. No abdominopelvic ascites. Musculoskeletal: No acute bony abnormality is noted. IMPRESSION: Stable appearing soft tissue mass lesion in the right abdomen envelop ping the right kidney with mild hydronephrosis as well as extending superiorly towards the liver and inferiorly into the pelvis. 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 Mild free fluid is noted within the abdomen stable from the prior study. No new focal abnormality is seen. Electronically Signed   By: Violeta Grey M.D.   On: 08/09/2023 22:51   DG Chest 2 View Result Date: 08/09/2023 CLINICAL DATA:  Shortness of breath EXAM: CHEST - 2 VIEW COMPARISON:  08/01/2023 FINDINGS: Heart mediastinal contours  within normal limits. Questionable ill-defined patchy lower lobe opacities. No visible effusions or pneumothorax. No acute bony abnormality. IMPRESSION: Questionable vague lower lobe opacities, possible atelectasis or pneumonia. Electronically Signed   By: Janeece Mechanic M.D.   On: 08/09/2023 21:14   CT ABDOMINAL MASS BIOPSY Result Date: 08/04/2023 INDICATION: Retroperitoneal mass.  Personal history of lymphoma. EXAM: CT-guided core biopsy of right retroperitoneal mass MEDICATIONS: None. ANESTHESIA/SEDATION: Moderate (conscious) sedation was employed during this procedure. A total of Versed  1 mg and Fentanyl  50 mcg was administered intravenously by the radiology nurse. Total intra-service moderate Sedation Time: 17 minutes. The patient's level of consciousness and vital signs were monitored continuously by radiology nursing throughout the procedure under my direct supervision. COMPLICATIONS: None immediate. PROCEDURE: Informed written consent was obtained from the patient after a thorough discussion of the procedural risks, benefits and alternatives. All questions were addressed. Maximal Sterile Barrier Technique was utilized including caps, mask, sterile gowns, sterile gloves, sterile drape, hand hygiene and skin antiseptic. A timeout was performed prior to the initiation of the procedure. CT imaging was performed on the large right retroperitoneal mass was identified. A suitable skin entry site was selected and marked. Local anesthesia was attained by infiltration with 1% lidocaine . A small dermatotomy was made. Under intermittent CT guidance, a 17 gauge introducer needle was carefully  advanced into the posterolateral aspect of the mass. Multiple 18 gauge core biopsies were then obtained coaxially using the bio Pince automated biopsy device. Biopsy specimens were placed in saline and delivered to pathology for further analysis. IMPRESSION: CT-guided core biopsy of right retroperitoneal mass. Electronically Signed    By: Fernando Hoyer M.D.   On: 08/04/2023 12:57   MR ABDOMEN WO CONTRAST Result Date: 08/02/2023 CLINICAL DATA:  Abnormal soft tissue in the right renal fossa on prior imaging. EXAM: MRI ABDOMEN WITHOUT CONTRAST TECHNIQUE: Multiplanar multisequence MR imaging was performed without the administration of intravenous contrast. COMPARISON:  CT stone study 08/01/2023. PET-CT 07/17/2023. Abdomen pelvis CT with contrast 06/14/2023. FINDINGS: Lower chest: No acute findings Hepatobiliary: No focal abnormality in the liver on noncontrast imaging. Gallbladder is nondistended with multiple tiny gallstones evident. No intrahepatic or extrahepatic biliary dilation. Pancreas: No focal mass lesion. No dilatation of the main duct. No intraparenchymal cyst. No peripancreatic edema. Spleen:  No splenomegaly. No suspicious focal mass lesion. Adrenals/Urinary Tract: Left adrenal gland unremarkable. Right adrenal gland not discretely visible. Mild fullness left intrarenal collecting system. Extensive abnormal soft tissue is seen in and around the right kidney and involving the right retroperitoneal space. 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 Punctate areas of signal void in the region of the right renal pelvis may be tiny calcified gallstones are foci of collecting system gas. This tissue shows marked diffusion restriction and tracks anteriorly to encase the descending and transverse duodenum and extends into the central abdominal mesentery. Inferior extent in the retroperitoneal space tracks down to the pelvis and right greater than left pelvic sidewall although this region is been incompletely visualized on this abdomen MRI. Lack of intravenous contrast hinders assessment. Stomach/Bowel: Tiny hiatal hernia. Stomach is nondilated. Duodenum is nondilated. No small bowel or colonic dilatation within the visualized abdomen. Vascular/Lymphatic: No abdominal aortic  aneurysm. Abnormal soft tissue in the right retroperitoneum generates mass-effect on the IVC with anterior displacement in the upper abdomen although signal void in the lumen of the IVC suggests that it remains patent. Other: Areas of free fluid are seen at the inferior tip of the liver and right paracolic gutter. Scattered tiny fluid collections are seen in the retroperitoneal space. Diffuse mesenteric and body wall edema evident. Musculoskeletal: No overtly suspicious marrow signal abnormality. IMPRESSION: 1. Extensive abnormal soft tissue in and around the right kidney and involving the right retroperitoneal space. This tissue tracks anteriorly to encase the descending and transverse duodenum and extends into the central abdominal mesentery. Inferior extent in the retroperitoneal space tracks down to the pelvis and right greater than left pelvic sidewalls although this region has been incompletely visualized on this abdomen MRI. Lack of intravenous contrast hinders assessment. As on the recent PET-CT, given that most of the abnormal tissue shows restricted diffusion, this is likely related to lymphoma given the patient's history although, again, assessment is limited by lack of intravenous contrast material. Tissue sampling would likely prove helpful. 2. 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 Although there does not appear to be a large retroperitoneal fluid collection, underlying component of urine leak is not excluded. Punctate areas of signal void in the region of the right renal pelvis may be tiny calcified stones or foci of collecting system gas. 3. Small volume intraperitoneal free fluid. 4. Cholelithiasis. 5. Tiny hiatal hernia. 6. Diffuse mesenteric and body wall edema. Electronically Signed   By: Lyell Samuel  Garon Kaiser M.D.   On: 08/02/2023 11:42   CT Renal Stone Study Result Date: 08/01/2023 CLINICAL DATA:  Abdominal/flank pain, stone suspected  EXAM: CT ABDOMEN AND PELVIS WITHOUT CONTRAST TECHNIQUE: Multidetector CT imaging of the abdomen and pelvis was performed following the standard protocol without IV contrast. RADIATION DOSE REDUCTION: This exam was performed according to the departmental dose-optimization program which includes automated exposure control, adjustment of the mA and/or kV according to patient size and/or use of iterative reconstruction technique. COMPARISON:  June 14, 2023 FINDINGS: Of note, the lack of intravenous contrast limits evaluation of the solid organ parenchyma and vascularity. Lower chest: Interlobular septal thickening with patchy ground-glass opacities in the lung bases. No pleural effusion. Hepatobiliary: No mass. Decompressed gallbladder without radiopaque stones or wall thickening. No intrahepatic or extrahepatic biliary ductal dilation. Pancreas: No mass or main ductal dilation. No peripancreatic inflammation or fluid collection. None in the a he air Spleen: Normal size. No mass. Adrenals/Urinary Tract: No left adrenal mass. The right adrenal gland is not well visualized. The right renal parenchyma is not well visualized due to the lack of intravenous contrast and large amount of soft tissue attenuation filling the right renal fossa, measuring 13.7 x 9.7 x 17.8 cm (unchanged from the prior PET-CT, but larger than on the prior CT, where it measured 10.5 x 9.9 x 15.9 cm). Moderate right-sided hydronephrosis with abrupt narrowing in the region of the UPJ, likely due to extrinsic compression. The urinary bladder is completely decompressed. Stomach/Bowel: The stomach is decompressed without focal abnormality. No small bowel wall thickening or inflammation. No small bowel obstruction. The appendix was not visualized. No right lower quadrant or pericecal inflammatory changes to suggest acute appendicitis. Vascular/Lymphatic: No aortic aneurysm. Diffuse aortoiliac atherosclerosis. Numerous subcentimeter upper retroperitoneal  lymph nodes measuring up to 7 mm, likely reactive. Reproductive: Hysterectomy. No concerning adnexal mass. Other: No pneumoperitoneum. A large amount of right retroperitoneal hemorrhage extending into the pelvis with additional hemorrhage extending in throughout the left retroperitoneal space, into the root of the mesentery, and along the left retroperitoneal space. Small volume perihepatic and perisplenic ascites. Musculoskeletal: No acute fracture or destructive lesion. IMPRESSION: 1. Redemonstrated abnormal soft tissue density material filling the right renal fossa, measuring 13.7 x 9.7 x 17.8 cm (unchanged from the prior PET-CT, but larger than the prior CT from March 12, where it measured 10.5 x 9.9 x 5.9 cm). Worsening soft tissue extending throughout the right and left retroperitoneal spaces, into the mesentery, and into the pelvis. Given the appearance on the prior studies, this may represent worsening retroperitoneal hematoma or soft tissue infiltration from possible lymphoma given the patient's history. On the prior PET-CT, the increased radiotracer activity could also be related to underlying collecting system injury in the setting of renal trauma. A multiphase CT of the abdomen and pelvis (with collecting phase imaging), recommended for further characterization. 2. Moderate right-sided hydronephrosis with abrupt narrowing at the UPJ, likely due to extrinsic compression from the perinephric and retroperitoneal soft tissue. 3. Findings in the lung bases, worrisome for either a developing bronchopneumonia, which includes viral etiologies, or early pulmonary edema. Critical Value/emergent results were called by telephone at the time of interpretation on 08/01/2023 at 3:28 pm to provider DAN FLOYD , who verbally acknowledged these results. Electronically Signed   By: Rance Burrows M.D.   On: 08/01/2023 15:53   DG Chest Port 1 View Result Date: 08/01/2023 CLINICAL DATA:  Cough and shortness of breath.  EXAM: PORTABLE CHEST 1 VIEW COMPARISON:  07/30/2023 FINDINGS: Single-view of the chest. Lungs are clear except for questionable mild blunting at the right costophrenic angle. Heart size is within normal limits and stable. Trachea is midline. Degenerative changes at the bilateral AC joints. IMPRESSION: 1. Questionable blunting at the right costophrenic angle. Cannot exclude small right pleural effusion. Otherwise, no acute chest finding. Electronically Signed   By: Elene Griffes M.D.   On: 08/01/2023 14:17   DG Chest 2 View Result Date: 07/30/2023 CLINICAL DATA:  Initial evaluation for shortness of breath. EXAM: CHEST - 2 VIEW COMPARISON:  Prior radiograph from 07/12/2023. FINDINGS: Transverse heart size stable, and remains within normal limits. Mediastinal silhouette within normal limits. Lungs are hypoinflated with scattered basilar predominant fibrotic lung changes, consistent with history of fibrotic lung disease. Slightly increased prominence of bibasilar interstitial markings could reflect superimposed interstitial edema or possibly atypical pneumonitis. No frank consolidative airspace disease. Trace right pleural effusion. No overt pulmonary edema or pneumothorax. Visualized osseous structures and soft tissues demonstrate no acute finding. IMPRESSION: 1. Hypoinflation with scattered basilar predominant fibrotic lung changes, consistent with history of fibrotic lung disease. 2. Slightly increased prominence of bibasilar interstitial markings as compared to prior, which could reflect superimposed interstitial edema or possibly atypical pneumonitis. No consolidative airspace disease. 3. Trace right pleural effusion. Electronically Signed   By: Virgia Griffins M.D.   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 EXAM: NUCLEAR MEDICINE PET SKULL BASE TO THIGH TECHNIQUE: 7.4  mCi F-18 FDG was injected intravenously. Full-ring PET imaging was performed from the skull base to thigh after the radiotracer. CT data was obtained and used for attenuation correction and anatomic localization. Fasting blood glucose: 103 mg/dl COMPARISON:  16/01/9603 and CT abdomen/pelvis from 06/14/2023 FINDINGS: Mediastinal blood pool activity: SUV max 2.0 Liver activity: SUV max NA NECK: No significant abnormal hypermetabolic activity in this region. Incidental CT findings: None. CHEST: No significant abnormal hypermetabolic activity in this region. Incidental CT findings: Mild atheromatous vascular calcification involving the thoracic aorta. Small type 1 hiatal hernia. ABDOMEN/PELVIS: On the CT of 08/22/2022, the right kidney appeared completely normal aside from non-rotation. On 06/14/2023 there was extensive perinephric process with ill definition of the right kidney upper pole, substantial vascular displacement, and abnormal density extending along the right diaphragm and right retroperitoneum ascribed to grade V renal injury due to the provided history of trauma and lack of abnormal appearance in this vicinity on the prior CT abdomen. Today there is accentuated activity present throughout the large right perinephric collection, with the upper portion having a maximum SUV around 7.9 (Deauville 5) but portions along the lower renal hilum, periureteral region, and right-sided mesentery with a maximum SUV up to 26.5 (Deauville 5). There is also clearly some ascites along the inferior margin of the right hepatic lobe which is not hypermetabolic. Although a urine leak could potentially explain high activity due to leaked radiopharmaceutical in the right perinephric fluid collection and right mesentery, I am suspicious that most of the right renal, perinephric, periureteral, and right mesenteric activity in masslike appearance is actually due to lymphoma. One other possibility is that the patient did have  combination of both perinephric tumor and also injury/hematoma on the 06/14/2023 exam. The total extent of abnormal perinephric, periureteral, retroperitoneal, and mesenteric activity is about 14.4 by 9.1 by 30.0 cm (volume = 2100 cm^3). This appears increased/progressive compared to 06/14/2023. Incidental CT findings:  Mild ascites above the urinary bladder and along the right paracolic gutter. Atherosclerosis is present, including aortoiliac atherosclerotic disease. SKELETON: No significant abnormal hypermetabolic activity in this region. Incidental CT findings: None. IMPRESSION: 1. Increase in volume of right perinephric, periureteral, retroperitoneal, and mesenteric density which is hypermetabolic on today's exam. Given the current appearance, I suspect that much of this actually represents lymphoma rather than blood products and hematoma, although the prior diagnosis from 06/14/2023 seems eminently reasonable given the trauma history, striking appearance, and new onset compared to 08/22/2022. Given that there is some perihepatic and pelvic ascites, the appearance could represent a combination of lymphoma and hematoma/trauma. Normally in this situation I would recommend a hematuria protocol CT with and without contrast to assess for enhancement of the perirenal tissues and also to look for urine leak. However, the patient's most recent GFR is only 32, and accordingly CT contrast administration is probably not advisable. Other possibilities to further characterize the perinephric collections include renal protocol MRI with and without contrast (to assess for tissue enhancement in phases prior to the excretory phase) or possibly nuclear medicine renal scintigraphy. 2. Small type 1 hiatal hernia. 3. Aortic atherosclerosis. Electronically Signed   By: Freida Jes M.D.   On: 07/26/2023 14:46    ASSESSMENT & PLAN:   83 y.o. woman with:   1.  CLL - CD20+ CD5+ve lymphoproliferative disorder presented with  hemolytic anemia and splenomegaly in December 2021. Though discussed with patient that FISH was neg and so Mantle cell lymphoma not confirm. Findings more consistent with CLL.  2.  hx Autoimmune hemolytic anemia: hgb continues to be in normal range without any evidence of hemolysis.  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 Presenting as Rt perinephric mass  4) Hypercalcemia - ? From dehydration vs recurrent lymphoma  5) previous h/o Hep B S Ag positivity -- will need to rpt labs to recheck prior to Rituxan.  6) Esophageal dysphagia -- barium swallow shows esophageal dysmotility and narrowing of lower esopagus. Awiating EGD.  7_ Esophageal candidiasis on fluconazole .  PLAN:  -Discussed lab results from 08/15/2023 in detail with patient. CBC showed WBC of 8.6K, hemoglobin of 10.3, and platelets of 244K. -patient received a biopsy of retroperitoneal mass on 08/04/2023 which shows signs of CLL as well as areas that have histologically transformed from CLL to large B cell lymphoma. Discussed that her biopsy sample was not as good as we would like.  -discussed option of receiving a better sample with a surgical biopsy, which could potentially delay the treatment process, or extrapolating results from her recent biopsy -discussed that a surgical biopsy to differentiate between two types of lymphoma would be a rather involved process with her large tumor and delicate kidney numbers.  -discussed that her leg swelling may be a factor of IV fluids as well as several enlarged lymph nodes in the abdomen not allowing the lymphatic system to work well -discussed that aggressive lymphomas are typically treated with multiple chemotherapies and immunotherapy -discussed that treatment considerations would also be dependent on her functional status -discussed that she may not tolerate standard R-CHOP regimen chemotherapy given her heart issues, stage 3 kidney failure, and  other health concerns -discussed option of hospice care with understanding that her condition would not be cured and focus would be on comfort care -patient is not inclined to consider hospice at this time -discussed option of R-miniCHOP combination chemotherapy regimen and see if she tolerates.  Will need rpt hepatitis  B testing to determine feasibility of using Rituxan. -discussed other option of non-aggressive treatment involving targeted therapies with only rituxan combined with second targeted therapy Bendamustine/Polatuzumab.  -discussed that dosing of treatment can be more challenging with increased age due to having less of the bone marrow remaining with increased age.  -discussed that there would be a role for medication to control her uric acid levels -discussed that there would typically be a role for port placment with most chemotherapy to prevent smaller veins from becoming inflamed. -Patient inclined to proceed with R-miniCHOP -will set up education session to discussed details of treatment -hoping to start treatment in the next week -will start patient on Prednisone  to start shrinking her mass prior to treatment -will start steroids today -discussed that she will continue appropriate medication while on treatment, which may be subject to change -recommend consuming softer foods due to swallowing difficulties.  -recommend to hold off on oral iron due to her swallowing difficulties -will order low-dose lasix  for fluid retention -will stop amlodipine  as it can cause swelling issues -will repeat echocardiocram to clear her for chemotherapy previous echo on 12/17 with EF of 60-65% -will order US  of legs to ensure there is no concern for blood clots in the lower extremities since her cancer and increased pressure in the areas are risk factors -discussed that dialysis may potentially be considered if she meets criteria for that. Reasons for consideration of dialysis include if the kidneys  are not felt to be working well and are holding on to fluid, significantly high potassium, accumulation of waste products, anemia, or drop in appetite -answered all of patient's and her family member's questions in detail   FOLLOW-UP: ECHO Port a cath [employer] for Mini CHOP +/-R depending on hep B status Scheduling to start treatment ASAP  The total time spent in the appointment was 41 minutes* .  All of the patient's questions were answered with apparent satisfaction. The patient knows to call the clinic with any problems, questions or concerns.   Jacquelyn Matt MD MS AAHIVMS [facility] Hematology/Oncology Physician [facility]  .*Total Encounter Time as defined by the Centers for Medicare and Medicaid Services includes, in addition to the face-to-face time of a patient visit (documented in the note above) non-face-to-face time: obtaining and reviewing outside history, ordering and reviewing medications, tests or procedures, care coordination (communications with other health care professionals or caregivers) and documentation in the medical record.    I,Mitra Faeizi,acting as a Neurosurgeon for Jacquelyn Matt, MD.,have documented all relevant documentation on the behalf of Jacquelyn Matt, MD,as directed by  Jacquelyn Matt, MD while in the presence of Jacquelyn Matt, MD.  .I have reviewed the above documentation for accuracy and completeness, and I agree with the above. .Taleen Prosser Kishore Uchechukwu Dhawan MD

## 2023-08-15 NOTE — ED Provider Notes (Signed)
 [facility] Provider Note   CSN: [id number] Arrival date & time: 08/15/23  4098     History  Chief Complaint  Patient presents with   Abdominal Pain   Leg Swelling    Jacqueline Hancock is a 83 y.o. female.  HPI Patient presents with nausea, p.o. intolerance, fatigue.  Patient has CLL, had biopsy performed 2 weeks ago, is awaiting results.  She was also seen and evaluated for similar presentation last week, now returns with worsening symptoms 1 day prior to anticipated oncology visit. She is here with her son, daughter-in-law who both assist with history.  Pain is diffuse, abdominal, anorexia is persistent, no fever, no chest pain though she does have mild shortness of breath.    Home Medications Prior to Admission medications   Medication Sig Start Date End Date Taking? Authorizing Provider  amLODipine  (NORVASC ) 5 MG tablet Take 1 tablet (5 mg total) by mouth daily. 06/14/23   Ngetich, Dinah C, NP  amoxicillin -clavulanate (AUGMENTIN ) 875-125 MG tablet Take 1 tablet by mouth every 12 (twelve) hours. 08/09/23   Kingsley, Victoria K, DO  azithromycin  (ZITHROMAX ) 250 MG tablet Take 1 tablet (250 mg total) by mouth daily. Starting tomorrow, take 1 every day until finished. 08/09/23   Kingsley, Victoria K, DO  B Complex Vitamins (VITAMIN B-COMPLEX PO) Take 1 tablet by mouth daily.    [provider]  Ferrous Sulfate  (IRON) 325 (65 Fe) MG TABS Take 325 mg by mouth daily.     [provider]  fluconazole  (DIFLUCAN ) 100 MG tablet Take 2 tablets (200 mg total) by mouth daily for 1 day, THEN 1 tablet (100 mg total) daily for 10 days. 08/09/23 08/20/23  Edmonia Gottron, PA-C  Loratadine (CLARITIN PO) Take by mouth daily.    [provider]  nadolol  (CORGARD ) 20 MG tablet TAKE 1 TABLET BY MOUTH DAILY FOR BLOOD PRESSURE OR TREMORS Patient taking differently: Take 20 mg by mouth 2 (two) times daily. TAKE 1 TABLET BY MOUTH DAILY FOR BLOOD  PRESSURE OR TREMORS 07/27/23   Ngetich, Dinah C, NP  nystatin  (MYCOSTATIN ) 100000 UNIT/ML suspension Take 5 mLs (500,000 Units total) by mouth 4 (four) times daily. 08/09/23   Edmonia Gottron, PA-C  omeprazole  (PRILOSEC) 20 MG capsule Open capsule and sprinkle on food once daily 08/09/23   Collier, Amanda R, PA-C  polyethylene glycol (MIRALAX  / GLYCOLAX ) 17 g packet Take 17 g by mouth daily as needed for mild constipation or moderate constipation. Also available OTC 06/18/23   Rai, Ripudeep K, MD  senna-docusate (SENOKOT-S) 8.6-50 MG tablet Take 2 tablets by mouth at bedtime. For constipation 06/18/23   Rai, Hurman Maiden, MD  traMADol  (ULTRAM ) 50 MG tablet Take 1 tablet (50 mg total) by mouth every 8 (eight) hours as needed for moderate pain (pain score 4-6) or severe pain (pain score 7-10) (pain). 06/18/23   Rai, Hurman Maiden, MD  gabapentin  (NEURONTIN ) 100 MG capsule Take 1 capsule 3 x /day for Neuropathy Pain Patient not taking: Reported on 01/25/2020 08/02/19 01/25/20  Vangie Genet, MD      Allergies    Nexium [esomeprazole magnesium] and Peanut butter flavoring agent (non-screening)    Review of Systems   Review of Systems  Physical Exam Updated Vital Signs BP (!) 134/57   Pulse 84   Temp 98.2 F (36.8 C) (Oral)   Resp 18   SpO2 100%  Physical Exam Vitals and nursing note reviewed.  Constitutional:  Appearance: She is well-developed. She is ill-appearing.  HENT:     Head: Normocephalic and atraumatic.  Eyes:     Conjunctiva/sclera: Conjunctivae normal.  Cardiovascular:     Rate and Rhythm: Normal rate and regular rhythm.  Pulmonary:     Effort: Pulmonary effort is normal. No respiratory distress.     Breath sounds: Normal breath sounds. No stridor.  Abdominal:     General: There is no distension.     Palpations: There is mass.     Tenderness: There is generalized abdominal tenderness and tenderness in the right upper quadrant and epigastric area.  Skin:    General: Skin is  warm and dry.  Neurological:     Mental Status: She is alert and oriented to person, place, and time.     Cranial Nerves: No cranial nerve deficit.  Psychiatric:        Mood and Affect: Mood normal.     ED Results / Procedures / Treatments   Labs (all labs ordered are listed, but only abnormal results are displayed) Labs Reviewed  LIPASE, BLOOD - Abnormal; Notable for the following components:      Result Value   Lipase 109 (*)    All other components within normal limits  COMPREHENSIVE METABOLIC PANEL WITH GFR - Abnormal; Notable for the following components:   CO2 15 (*)    Glucose, Bld 103 (*)    BUN 40 (*)    Creatinine, Ser 2.51 (*)    Total Protein 6.2 (*)    Albumin 2.8 (*)    GFR, Estimated 19 (*)    All other components within normal limits  CBC - Abnormal; Notable for the following components:   RBC 3.64 (*)    Hemoglobin 10.3 (*)    HCT 34.2 (*)    All other components within normal limits  URINALYSIS, ROUTINE W REFLEX MICROSCOPIC    EKG EKG Interpretation Date/Time:  Tuesday Aug 15 2023 09:53:16 EDT Ventricular Rate:  93 PR Interval:  122 QRS Duration:  78 QT Interval:  358 QTC Calculation: 445 R Axis:   12  Text Interpretation: Normal sinus rhythm Nonspecific T wave abnormality Abnormal ECG When compared with ECG of 30-Jul-2023 17:51, PREVIOUS ECG IS PRESENT Confirmed by Dorenda Gandy [phone]) on 08/15/2023 11:48:07 AM  Radiology DG Chest 2 View Result Date: 08/15/2023 CLINICAL DATA:  Shortness of breath. EXAM: CHEST - 2 VIEW COMPARISON:  Aug 09, 2023. FINDINGS: The heart size and mediastinal contours are within normal limits. Both lungs are clear. The visualized skeletal structures are unremarkable. IMPRESSION: No active cardiopulmonary disease. Electronically Signed   By: Rosalene Colon M.D.   On: 08/15/2023 11:25    Procedures Procedures    Medications Ordered in ED Medications  sodium chloride  0.9 % bolus 1,000 mL (has no administration in  time range)    ED Course/ Medical Decision Making/ A&P                                 Medical Decision Making Elderly female with CLL presents in the context of worsening anorexia, nausea, fatigue while awaiting biopsy results from procedure performed within the past 2 weeks on mass in the right upper quadrant.  Concern for aggression disease, dehydration/AKI, anorexia, infection.  Cardiac 90 sinus normal pulse ox 100% room air normal  Amount and/or Complexity of Data Reviewed Independent Historian:     Details: Son at bedside External  Data Reviewed: notes.    Details: Cancer notes Labs: ordered. Decision-making details documented in ED Course. Radiology: ordered and independent interpretation performed. Decision-making details documented in ED Course.    Details: I reviewed the CT imaging from last week at bedside demonstrating the mass, compression  Risk Decision regarding hospitalization. Diagnosis or treatment significantly limited by social determinants of health.  Patient's labs consistent with worsening dehydration, acute kidney injury with worsening hepatic and pancreatic function.  With CT suggesting mass compressing multiple areas of her anatomy, suspicion for mass effect. 3:19 PM Patient in similar condition.  Labs reviewed, discussed, and I discussed them with patient's daughter-in-law, and have discussed her case with Dr. Scherrie Curt, and conversed with the patient's own oncologist.  With suspicion for progression of disease, need for ongoing oncologic discussion, family, I, patient discussed fluid resuscitation here and given evidence for dehydration, and then patient will be discharged to follow-up with her oncologist tomorrow in the clinic for conversation regarding the biopsy results, options for care going forward.        Final Clinical Impression(s) / ED Diagnoses Final diagnoses:  Right upper quadrant abdominal mass  Dehydration     Dorenda Gandy,  MD 08/15/23 [phone]

## 2023-08-15 NOTE — ED Notes (Signed)
 Two IV attempts unsuccessful. Pt states she has small veins and usually needs USIV

## 2023-08-15 NOTE — Discharge Instructions (Addendum)
 It is important to follow-up with your oncologist tomorrow.  Return here for concerning changes in your condition.

## 2023-08-16 ENCOUNTER — Inpatient Hospital Stay: Attending: Hematology | Admitting: Hematology

## 2023-08-16 VITALS — BP 139/62 | HR 92 | Temp 97.5°F | Resp 18 | Ht 59.0 in | Wt 148.4 lb

## 2023-08-16 DIAGNOSIS — Z9049 Acquired absence of other specified parts of digestive tract: Secondary | ICD-10-CM | POA: Diagnosis not present

## 2023-08-16 DIAGNOSIS — Z5189 Encounter for other specified aftercare: Secondary | ICD-10-CM | POA: Insufficient documentation

## 2023-08-16 DIAGNOSIS — C9110 Chronic lymphocytic leukemia of B-cell type not having achieved remission: Secondary | ICD-10-CM | POA: Insufficient documentation

## 2023-08-16 DIAGNOSIS — Z9221 Personal history of antineoplastic chemotherapy: Secondary | ICD-10-CM | POA: Insufficient documentation

## 2023-08-16 DIAGNOSIS — Z5111 Encounter for antineoplastic chemotherapy: Secondary | ICD-10-CM | POA: Diagnosis not present

## 2023-08-16 DIAGNOSIS — B3781 Candidal esophagitis: Secondary | ICD-10-CM | POA: Diagnosis not present

## 2023-08-16 DIAGNOSIS — K224 Dyskinesia of esophagus: Secondary | ICD-10-CM | POA: Diagnosis not present

## 2023-08-16 DIAGNOSIS — D591 Autoimmune hemolytic anemia, unspecified: Secondary | ICD-10-CM | POA: Diagnosis not present

## 2023-08-16 DIAGNOSIS — C8338 Diffuse large B-cell lymphoma, lymph nodes of multiple sites: Secondary | ICD-10-CM | POA: Diagnosis not present

## 2023-08-18 ENCOUNTER — Encounter: Payer: Self-pay | Admitting: Hematology

## 2023-08-18 ENCOUNTER — Inpatient Hospital Stay

## 2023-08-18 ENCOUNTER — Other Ambulatory Visit: Payer: Self-pay | Admitting: Radiology

## 2023-08-18 ENCOUNTER — Ambulatory Visit (HOSPITAL_COMMUNITY)
Admission: RE | Admit: 2023-08-18 | Discharge: 2023-08-18 | Disposition: A | Discharge status: Home / Self Care | Source: Ambulatory Visit | Attending: Physician Assistant | Admitting: Physician Assistant

## 2023-08-18 ENCOUNTER — Telehealth: Payer: Self-pay | Admitting: *Deleted

## 2023-08-18 ENCOUNTER — Ambulatory Visit: Payer: Self-pay | Admitting: Physician Assistant

## 2023-08-18 ENCOUNTER — Other Ambulatory Visit: Payer: Self-pay | Admitting: Physician Assistant

## 2023-08-18 ENCOUNTER — Other Ambulatory Visit

## 2023-08-18 ENCOUNTER — Other Ambulatory Visit: Payer: Self-pay | Admitting: Hematology

## 2023-08-18 DIAGNOSIS — C8338 Diffuse large B-cell lymphoma, lymph nodes of multiple sites: Secondary | ICD-10-CM

## 2023-08-18 DIAGNOSIS — C9110 Chronic lymphocytic leukemia of B-cell type not having achieved remission: Secondary | ICD-10-CM | POA: Diagnosis not present

## 2023-08-18 DIAGNOSIS — R7989 Other specified abnormal findings of blood chemistry: Secondary | ICD-10-CM

## 2023-08-18 DIAGNOSIS — Z8619 Personal history of other infectious and parasitic diseases: Secondary | ICD-10-CM

## 2023-08-18 DIAGNOSIS — R1319 Other dysphagia: Secondary | ICD-10-CM | POA: Diagnosis present

## 2023-08-18 DIAGNOSIS — B3781 Candidal esophagitis: Secondary | ICD-10-CM

## 2023-08-18 LAB — CBC WITH DIFFERENTIAL (CANCER CENTER ONLY)
Abs Immature Granulocytes: 0.05 10*3/uL (ref 0.00–0.07)
Basophils Absolute: 0.1 10*3/uL (ref 0.0–0.1)
Basophils Relative: 1 %
Eosinophils Absolute: 0 10*3/uL (ref 0.0–0.5)
Eosinophils Relative: 0 %
HCT: 29.9 % — ABNORMAL LOW (ref 36.0–46.0)
Hemoglobin: 9.8 g/dL — ABNORMAL LOW (ref 12.0–15.0)
Immature Granulocytes: 1 %
Lymphocytes Relative: 15 %
Lymphs Abs: 1.4 10*3/uL (ref 0.7–4.0)
MCH: 27.9 pg (ref 26.0–34.0)
MCHC: 32.8 g/dL (ref 30.0–36.0)
MCV: 85.2 fL (ref 80.0–100.0)
Monocytes Absolute: 1.1 10*3/uL — ABNORMAL HIGH (ref 0.1–1.0)
Monocytes Relative: 12 %
Neutro Abs: 6.6 10*3/uL (ref 1.7–7.7)
Neutrophils Relative %: 71 %
Platelet Count: 311 10*3/uL (ref 150–400)
RBC: 3.51 MIL/uL — ABNORMAL LOW (ref 3.87–5.11)
RDW: 13.6 % (ref 11.5–15.5)
WBC Count: 9.3 10*3/uL (ref 4.0–10.5)
nRBC: 0 % (ref 0.0–0.2)

## 2023-08-18 LAB — CMP (CANCER CENTER ONLY)
ALT: 12 U/L (ref 0–44)
AST: 20 U/L (ref 15–41)
Albumin: 3.6 g/dL (ref 3.5–5.0)
Alkaline Phosphatase: 58 U/L (ref 38–126)
Anion gap: 10 (ref 5–15)
BUN: 43 mg/dL — ABNORMAL HIGH (ref 8–23)
CO2: 18 mmol/L — ABNORMAL LOW (ref 22–32)
Calcium: 10.2 mg/dL (ref 8.9–10.3)
Chloride: 108 mmol/L (ref 98–111)
Creatinine: 2.91 mg/dL — ABNORMAL HIGH (ref 0.44–1.00)
GFR, Estimated: 16 mL/min — ABNORMAL LOW (ref >60)
Glucose, Bld: 92 mg/dL (ref 70–99)
Potassium: 4.6 mmol/L (ref 3.5–5.1)
Sodium: 136 mmol/L (ref 135–145)
Total Bilirubin: 0.7 mg/dL (ref 0.0–1.2)
Total Protein: 6.6 g/dL (ref 6.5–8.1)

## 2023-08-18 LAB — HEPATITIS C ANTIBODY: HCV Ab: NONREACTIVE

## 2023-08-18 MED ORDER — PREDNISONE 20 MG PO TABS: 60.0000 mg | ORAL_TABLET | Freq: Every day | ORAL | 5 refills | Status: DC

## 2023-08-18 MED ORDER — ONDANSETRON HCL 8 MG PO TABS: 8.0000 mg | ORAL_TABLET | Freq: Three times a day (TID) | ORAL | 1 refills | Status: DC | PRN

## 2023-08-18 MED ORDER — FUROSEMIDE 20 MG PO TABS: 20.0000 mg | ORAL_TABLET | Freq: Every day | ORAL | 0 refills | Status: DC

## 2023-08-18 MED ORDER — PREDNISONE 20 MG PO TABS: ORAL_TABLET | ORAL | 0 refills | Status: DC

## 2023-08-18 MED ORDER — LIDOCAINE-PRILOCAINE 2.5-2.5 % EX CREA: TOPICAL_CREAM | CUTANEOUS | 3 refills | Status: DC

## 2023-08-18 MED ORDER — PROCHLORPERAZINE MALEATE 10 MG PO TABS
10.0000 mg | ORAL_TABLET | Freq: Four times a day (QID) | ORAL | 6 refills | Status: DC | PRN
Start: 2023-08-18 — End: 2023-11-13

## 2023-08-18 MED ORDER — ALLOPURINOL 100 MG PO TABS: 100.0000 mg | ORAL_TABLET | Freq: Two times a day (BID) | ORAL | 0 refills | Status: DC

## 2023-08-18 NOTE — Telephone Encounter (Signed)
 TCT pt's son regarding his mother's upcoming appts. Spoke with him. Advised of all the appts she has next week, where and when to arrive.. She has cardiac echo, port placement, labs, patient education, Dr. Salomon Cree and then finally her 1st treatment all scheduled for next week. Advised we need to do her labs today. [name] voiced understanding and wrote everything down. He will have his brother bring her here today for labs at 2 pm. Dr. Salomon Cree aware of all appts

## 2023-08-18 NOTE — Progress Notes (Signed)
   This encounter was created in error - please disregard. No show

## 2023-08-18 NOTE — Progress Notes (Signed)
START ON PATHWAY REGIMEN - Lymphoma and CLL     A cycle is every 21 days:     Prednisone      Rituximab-xxxx      Cyclophosphamide      Doxorubicin      Vincristine   **Always confirm dose/schedule in your pharmacy ordering system**  Patient Characteristics: Diffuse Large B-Cell Lymphoma or Follicular Lymphoma, Grade 3B, First Line, Stage III and IV Disease Type: Not Applicable Disease Type: Diffuse Large B-Cell Lymphoma Disease Type: Not Applicable Line of therapy: First Line Intent of Therapy: Curative Intent, Discussed with Patient 

## 2023-08-19 ENCOUNTER — Other Ambulatory Visit: Payer: Self-pay

## 2023-08-19 LAB — HEPATITIS B DNA, ULTRAQUANTITATIVE, PCR
HBV DNA SERPL PCR-ACNC: 110 [IU]/mL
HBV DNA SERPL PCR-LOG IU: 2.041 {Log_IU}/mL

## 2023-08-19 LAB — HEPATITIS B CORE ANTIBODY, TOTAL: HEP B CORE AB: POSITIVE — AB

## 2023-08-20 ENCOUNTER — Other Ambulatory Visit: Payer: Self-pay | Admitting: Student

## 2023-08-21 ENCOUNTER — Ambulatory Visit (HOSPITAL_COMMUNITY)
Admission: RE | Admit: 2023-08-21 | Discharge: 2023-08-21 | Disposition: A | Discharge status: Home / Self Care | Source: Ambulatory Visit | Attending: Hematology | Admitting: Hematology

## 2023-08-21 ENCOUNTER — Encounter: Payer: Self-pay | Admitting: Hematology

## 2023-08-21 ENCOUNTER — Other Ambulatory Visit: Payer: Self-pay

## 2023-08-21 DIAGNOSIS — I10 Essential (primary) hypertension: Secondary | ICD-10-CM | POA: Diagnosis not present

## 2023-08-21 DIAGNOSIS — K219 Gastro-esophageal reflux disease without esophagitis: Secondary | ICD-10-CM | POA: Diagnosis not present

## 2023-08-21 DIAGNOSIS — C8330 Diffuse large B-cell lymphoma, unspecified site: Secondary | ICD-10-CM | POA: Insufficient documentation

## 2023-08-21 DIAGNOSIS — E785 Hyperlipidemia, unspecified: Secondary | ICD-10-CM | POA: Insufficient documentation

## 2023-08-21 DIAGNOSIS — C8338 Diffuse large B-cell lymphoma, lymph nodes of multiple sites: Secondary | ICD-10-CM

## 2023-08-21 HISTORY — PX: IR IMAGING GUIDED PORT INSERTION: IMG5740

## 2023-08-21 LAB — HEPATITIS B SURFACE ANTIGEN: Hepatitis B Surface Ag: REACTIVE — AB

## 2023-08-21 MED ORDER — LIDOCAINE-EPINEPHRINE 1 %-1:100000 IJ SOLN
INTRAMUSCULAR | Status: AC
Start: 2023-08-21 — End: ?
  Filled 2023-08-21: qty 1

## 2023-08-21 MED ORDER — SODIUM CHLORIDE 0.9 % IV SOLN: INTRAVENOUS | Status: DC

## 2023-08-21 MED ORDER — MIDAZOLAM HCL 2 MG/2ML IJ SOLN
INTRAMUSCULAR | Status: AC | PRN
  Administered 2023-08-21: 1 mg via INTRAVENOUS

## 2023-08-21 MED ORDER — MIDAZOLAM HCL 2 MG/2ML IJ SOLN
INTRAMUSCULAR | Status: AC
  Filled 2023-08-21: qty 2

## 2023-08-21 MED ORDER — HEPARIN SOD (PORK) LOCK FLUSH 100 UNIT/ML IV SOLN
INTRAVENOUS | Status: AC
  Filled 2023-08-21: qty 5

## 2023-08-21 MED ORDER — HEPARIN SOD (PORK) LOCK FLUSH 100 UNIT/ML IV SOLN
500.0000 [IU] | Freq: Once | INTRAVENOUS | Status: AC
  Administered 2023-08-21: 500 [IU] via INTRAVENOUS

## 2023-08-21 MED ORDER — FENTANYL CITRATE (PF) 100 MCG/2ML IJ SOLN
INTRAMUSCULAR | Status: AC | PRN
  Administered 2023-08-21 (×2): 25 ug via INTRAVENOUS

## 2023-08-21 MED ORDER — LIDOCAINE-EPINEPHRINE 1 %-1:100000 IJ SOLN
20.0000 mL | Freq: Once | INTRAMUSCULAR | Status: AC
  Administered 2023-08-21: 20 mL via INTRADERMAL

## 2023-08-21 MED ORDER — FENTANYL CITRATE (PF) 100 MCG/2ML IJ SOLN
INTRAMUSCULAR | Status: AC
Start: 2023-08-21 — End: ?
  Filled 2023-08-21: qty 2

## 2023-08-21 NOTE — H&P (Addendum)
 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 Jacqueline Hancock was seen by her oncologist in April and was noted to have new perirenal findings on recent PET scan and Jacqueline Hancock was sent to the ED on 4/29. In the ED Jacqueline Hancock underwent CT renal stone study which showed:  1. Redemonstrated abnormal soft tissue density material filling the right renal fossa, measuring 13.7 x 9.7 x 17.8 cm (unchanged from the prior PET-CT, but larger than the prior CT from March 12, where it measured 10.5 x 9.9 x 5.9 cm). Worsening soft tissue extending throughout the right and left retroperitoneal spaces, into the mesentery, and into the pelvis. Given the appearance on the prior studies, this may represent worsening retroperitoneal hematoma or soft tissue infiltration from possible lymphoma given the patient's history. On the prior PET-CT, the increased radiotracer activity could also be related to underlying collecting system injury in the setting of renal trauma. A multiphase CT of the abdomen and pelvis (with collecting phase imaging), recommended for further characterization. 2. Moderate right-sided hydronephrosis with abrupt narrowing at the UPJ, likely due to extrinsic compression from the perinephric and retroperitoneal soft tissue. 3. Findings in the lung bases, worrisome for either a developing bronchopneumonia, which includes viral etiologies, or early pulmonary edema  Follow up MRI abdomen w/o contrast was then obtained for follow up which showed:  1. Extensive abnormal soft tissue in and around the right kidney and involving the right retroperitoneal space. This tissue  tracks anteriorly to encase the descending and transverse duodenum and extends into the central abdominal mesentery. Inferior extent in the retroperitoneal space tracks down to the pelvis and right greater than left pelvic sidewalls although this region has been incompletely visualized on this abdomen MRI. Lack of intravenous contrast hinders assessment. As on the recent PET-CT, given that most of the abnormal tissue shows restricted diffusion, this is likely related to lymphoma given the patient's history although, again, assessment is limited by lack of intravenous contrast material. Tissue sampling would likely prove helpful. 2. 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 Although there does not appear to be a large retroperitoneal fluid collection, underlying component of urine leak is not excluded. Punctate areas of signal void in the region of the right renal pelvis may be tiny calcified stones or foci of collecting system gas. 3. Small volume intraperitoneal free fluid. 4. Cholelithiasis. 5. Tiny hiatal hernia. 6. Diffuse mesenteric and body wall edema.  Jacqueline Hancock then underwent image guided biopsy in IR on 08/04/23 and pathology showed B-cell lymphoproliferative disorder. Jacqueline Hancock was seen by her oncologist and decision was made to proceed with port placement for which Jacqueline Hancock presents today.  Jacqueline Hancock has been experiencing bilateral leg swelling for several weeks which Jacqueline Hancock is being follow for and trying some new medicines. Jacqueline Hancock has also lost most of the taste buds on her tongue after undergoing treatment for thrush recently, her tongue is non tender and has no open wounds. Jacqueline Hancock has never had a port before and understands the procedure today.  Past Medical History:  Diagnosis Date   CLL (chronic lymphocytic leukemia) (HCC)    Dyspnea 2021   with exersion  Early cataracts, bilateral    Elevated hemoglobin A1c    Fibroids    GERD  (gastroesophageal reflux disease)    Hyperlipidemia    Hypertension    Lymphoma (HCC)    Osteopenia     Past Surgical History:  Procedure Laterality Date   ABDOMINAL HYSTERECTOMY     APPENDECTOMY     CATARACT EXTRACTION, BILATERAL Bilateral 09/2017   Dr. 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   TONSILLECTOMY AND ADENOIDECTOMY      Allergies: Nexium [esomeprazole magnesium] and Peanut butter flavoring agent (non-screening)  Medications: Prior to Admission medications   Medication Sig Start Date End Date Taking? Authorizing Provider  allopurinol  (ZYLOPRIM ) 100 MG tablet Take 1 tablet (100 mg total) by mouth 2 (two) times daily. 08/18/23  Yes Frankie Israel, MD  amLODipine  (NORVASC ) 5 MG tablet Take 1 tablet (5 mg total) by mouth daily. 06/14/23  Yes Ngetich, Dinah C, NP  amoxicillin -clavulanate (AUGMENTIN ) 875-125 MG tablet Take 1 tablet by mouth every 12 (twelve) hours. 08/09/23  Yes Nora Beal, Victoria K, DO  azithromycin  (ZITHROMAX ) 250 MG tablet Take 1 tablet (250 mg total) by mouth daily. Starting tomorrow, take 1 every day until finished. 08/09/23  Yes Nora Beal, Victoria K, DO  B Complex Vitamins (VITAMIN B-COMPLEX PO) Take 1 tablet by mouth daily.   Yes [provider]  Ferrous Sulfate  (IRON) 325 (65 Fe) MG TABS Take 325 mg by mouth daily.    Yes [provider]  furosemide  (LASIX ) 20 MG tablet Take 1 tablet (20 mg total) by mouth daily. 08/18/23  Yes Kale, Gautam Kishore, MD  Loratadine (CLARITIN PO) Take by mouth daily.   Yes [provider]  nystatin  (MYCOSTATIN ) 100000 UNIT/ML suspension Take 5 mLs (500,000 Units total) by mouth 4 (four) times daily. 08/09/23  Yes Edmonia Gottron, PA-C  omeprazole  (PRILOSEC) 20 MG capsule Open capsule and sprinkle on food once daily 08/09/23  Yes Collier, Amanda R, PA-C  ondansetron  (ZOFRAN ) 8 MG tablet Take 1 tablet (8 mg total) by mouth every 8 (eight) hours as needed for nausea or vomiting. Start on  the third day after cyclophosphamide chemotherapy. 08/18/23  Yes Frankie Israel, MD  polyethylene glycol (MIRALAX  / GLYCOLAX ) 17 g packet Take 17 g by mouth daily as needed for mild constipation or moderate constipation. Also available OTC 06/18/23  Yes Rai, Ripudeep K, MD  predniSONE  (DELTASONE ) 20 MG tablet Take 3 tablets (60 mg total) by mouth daily with breakfast for 5 days, THEN 2 tablets (40 mg total) daily with breakfast for 5 days. 08/18/23 08/28/23 Yes Frankie Israel, MD  predniSONE  (DELTASONE ) 20 MG tablet Take 3 tablets (60 mg total) by mouth daily. Take with food on days 2-5 of chemotherapy. 08/18/23  Yes Frankie Israel, MD  prochlorperazine  (COMPAZINE ) 10 MG tablet Take 1 tablet (10 mg total) by mouth every 6 (six) hours as needed for nausea or vomiting. 08/18/23  Yes Frankie Israel, MD  senna-docusate (SENOKOT-S) 8.6-50 MG tablet Take 2 tablets by mouth at bedtime. For constipation 06/18/23  Yes Rai, Ripudeep K, MD  traMADol  (ULTRAM ) 50 MG tablet Take 1 tablet (50 mg total) by mouth every 8 (eight) hours as needed for moderate pain (pain score 4-6) or severe pain (pain score 7-10) (pain). 06/18/23  Yes Rai, Ripudeep K, MD  lidocaine -prilocaine  (EMLA ) cream Apply to affected area once 08/18/23   Frankie Israel, MD  nadolol  (CORGARD ) 20 MG tablet TAKE 1  TABLET BY MOUTH DAILY FOR BLOOD PRESSURE OR TREMORS Patient taking differently: Take 20 mg by mouth 2 (two) times daily. TAKE 1 TABLET BY MOUTH DAILY FOR BLOOD PRESSURE OR TREMORS 07/27/23   Ngetich, Dinah C, NP  gabapentin  (NEURONTIN ) 100 MG capsule Take 1 capsule 3 x /day for Neuropathy Pain Patient not taking: Reported on 01/25/2020 08/02/19 01/25/20  Vangie Genet, MD     Family History  Problem Relation Age of Onset   Leukemia Mother    Hypertension Father    Diabetes Sister    Heart disease Brother    Diabetes Brother    Diabetes Brother    Hypertension Son    Heart disease Son     Social History    Socioeconomic History   Marital status: Widowed    Spouse name: Not on file   Number of children: 3   Years of education: Not on file   Highest education level: Not on file  Occupational History   Occupation: retired  Tobacco Use   Smoking status: Never   Smokeless tobacco: Never  Vaping Use   Vaping status: Never Used  Substance and Sexual Activity   Alcohol use: No   Drug use: Never   Sexual activity: Not on file  Other Topics Concern   Not on file  Social History Narrative   Not on file   Social Drivers of Health   Financial Resource Strain: Not on file  Food Insecurity: No Food Insecurity (08/02/2023)   Hunger Vital Sign    Worried About Running Out of Food in the Last Year: Never true    Ran Out of Food in the Last Year: Never true  Transportation Needs: No Transportation Needs (08/02/2023)   PRAPARE - Administrator, Civil Service (Medical): No    Lack of Transportation (Non-Medical): No  Physical Activity: Not on file  Stress: Not on file  Social Connections: Unknown (08/03/2023)   Social Connection and Isolation Panel [NHANES]    Frequency of Communication with Friends and Family: More than three times a week    Frequency of Social Gatherings with Friends and Family: Twice a week    Attends Religious Services: More than 4 times per year    Active Member of [employer] or Organizations: Yes    Attends Engineer, structural: More than 4 times per year    Marital Status: Patient unable to answer     Review of Systems: A 12 point ROS discussed and pertinent positives are indicated in the HPI above.  All other systems are negative.  Review of Systems  Constitutional:  Negative for chills and fever.  HENT:         (+) loss of taste buds after recent treatment for thrush; not painful but food tastes strange  Respiratory:  Negative for cough and shortness of breath.   Cardiovascular:  Positive for leg swelling (recently started on medicine for this;  following up with PCP this week). Negative for chest pain.  Neurological:  Negative for dizziness and headaches.    Vital Signs: BP (!) 124/59   Pulse 83   Temp 97.9 F (36.6 C) (Oral)   Resp 16   Ht 4\' 11"  (1.499 m)   Wt 140 lb (63.5 kg)   SpO2 99%   BMI 28.28 kg/m   Physical Exam Vitals reviewed.  Constitutional:      General: Jacqueline Hancock is not in acute distress. HENT:     Head: Normocephalic.  Mouth/Throat:     Mouth: Mucous membranes are moist.     Pharynx: Oropharynx is clear. No oropharyngeal exudate or posterior oropharyngeal erythema.  Cardiovascular:     Rate and Rhythm: Normal rate and regular rhythm.  Pulmonary:     Effort: Pulmonary effort is normal.     Breath sounds: Normal breath sounds.  Abdominal:     General: There is no distension.     Palpations: Abdomen is soft.     Tenderness: There is no abdominal tenderness.  Musculoskeletal:     Right lower leg: Edema present.     Left lower leg: Edema present.  Skin:    General: Skin is warm and dry.  Neurological:     Mental Status: Jacqueline Hancock is alert and oriented to person, place, and time.  Psychiatric:        Mood and Affect: Mood normal.        Behavior: Behavior normal.        Thought Content: Thought content normal.        Judgment: Judgment normal.      MD Evaluation Airway: WNL Heart: WNL Abdomen: WNL Chest/ Lungs: WNL ASA  Classification: 3 Mallampati/Airway Score: One   Imaging: DG ESOPHAGUS W SINGLE CM (SOL OR THIN BA) Addendum Date: 08/19/2023 ADDENDUM REPORT: 08/19/2023 08:35 ADDENDUM: Addendum for clarification of body of report. Hiatus hernia without evidence of stricture. Electronically Signed   By: Art Largo M.D.   On: 08/19/2023 08:35   Result Date: 08/19/2023 CLINICAL DATA:  dysphagia 83 year old female with dysphagia and globus sensation for esophagram. EXAM: ESOPHAGUS/BARIUM SWALLOW/TABLET STUDY TECHNIQUE: Single contrast examination was performed using thin liquid barium. This  exam was performed by Abigail C. Emerson, PA-C, and was supervised and interpreted by Dr. Darylene Epley. FLUOROSCOPY: Radiation Exposure Index and estimated peak skin dose (PSD); Reference air kerma (RAK), 29.4 mGy. Kerma-area product (KAP), 409.9 uGy*m. COMPARISON:  CT AP, 12/02/2022 FINDINGS: Swallowing: Appears normal. No vestibular penetration or aspiration seen. Pharynx: Unremarkable. Esophagus: No mucosal lesions. Smooth, tapering stricture above gastroesophageal junction. Esophageal motility: Moderate esophageal dysmotility visualized with resultant contrast stasis and delayed emptying into the stomach. Hiatal Hernia: Mild hiatal hernia. Gastroesophageal reflux: None visualized, even with provacative maneuvers. Ingested 13mm barium tablet: Became stuck just above the gastroesophageal junction, where the tapering stricture is visualized. Waited 3 minutes with subsequent swallows of water and barium, tablet remained stuck. Pt informed the tablet will dissolve on its own. Other: None. IMPRESSION: Suboptimal evaluation, secondary to patient's inability to remain upright. 1. Small hiatus hernia. 2. 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 Performed By Lorinda Root, PA-C and supervised by Art Largo, MD Electronically Signed: By: Art Largo M.D. On: 08/18/2023 09:35   DG Chest 2 View Result Date: 08/15/2023 CLINICAL DATA:  Shortness of breath. EXAM: CHEST - 2 VIEW COMPARISON:  Aug 09, 2023. FINDINGS: The heart size and mediastinal contours are within normal limits. Both lungs are clear. The visualized skeletal structures are unremarkable. IMPRESSION: No active cardiopulmonary disease. Electronically Signed   By: Rosalene Colon M.D.   On: 08/15/2023 11:25   CT ABDOMEN PELVIS WO CONTRAST Result Date: 08/09/2023 CLINICAL DATA:  Abdominal pain no known history of lymphoma and recently positive biopsy EXAM: CT ABDOMEN AND PELVIS WITHOUT CONTRAST  TECHNIQUE: Multidetector CT imaging of the abdomen and pelvis was performed following the standard protocol without IV contrast. RADIATION DOSE REDUCTION: This exam was performed according to the departmental dose-optimization program which  includes automated exposure control, adjustment of the mA and/or kV according to patient size and/or use of iterative reconstruction technique. COMPARISON:  08/01/2023 FINDINGS: Lower chest: Previously seen patchy ground-glass opacities are again seen but improved when compared with the prior study. Hepatobiliary: Persistent perihepatic fluid is noted. The gallbladder is within normal limits. Pancreas: Unremarkable. No pancreatic ductal dilatation or surrounding inflammatory changes. Spleen: Normal in size without focal abnormality. Mild perisplenic fluid is noted. Adrenals/Urinary Tract: Left adrenal gland is within normal limits. Left kidney shows no acute abnormality. No obstructive changes are seen.The right kidney is envelop by a large soft tissue mass stable in appearance from the recent exam as well as a recent MRI. 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 The surrounding soft tissues extend inferiorly similar to that seen on the prior exam. The bladder is decompressed. Stomach/Bowel: No obstructive or inflammatory changes of the colon are seen. The appendix is not well visualized. Small bowel and stomach are unremarkable. Vascular/Lymphatic: Aortic atherosclerosis. No enlarged abdominal or pelvic lymph nodes. Reproductive: Uterus has been surgically removed. Other: No abdominal wall hernia or abnormality. No abdominopelvic ascites. Musculoskeletal: No acute bony abnormality is noted. IMPRESSION: Stable appearing soft tissue mass lesion in the right abdomen envelop ping the right kidney with mild hydronephrosis as well as extending superiorly towards the liver and inferiorly into the pelvis.  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 Mild free fluid is noted within the abdomen stable from the prior study. No new focal abnormality is seen. Electronically Signed   By: Violeta Grey M.D.   On: 08/09/2023 22:51   DG Chest 2 View Result Date: 08/09/2023 CLINICAL DATA:  Shortness of breath EXAM: CHEST - 2 VIEW COMPARISON:  08/01/2023 FINDINGS: Heart mediastinal contours within normal limits. Questionable ill-defined patchy lower lobe opacities. No visible effusions or pneumothorax. No acute bony abnormality. IMPRESSION: Questionable vague lower lobe opacities, possible atelectasis or pneumonia. Electronically Signed   By: Janeece Mechanic M.D.   On: 08/09/2023 21:14   CT ABDOMINAL MASS BIOPSY Result Date: 08/04/2023 INDICATION: Retroperitoneal mass.  Personal history of lymphoma. EXAM: CT-guided core biopsy of right retroperitoneal mass MEDICATIONS: None. ANESTHESIA/SEDATION: Moderate (conscious) sedation was employed during this procedure. A total of Versed  1 mg and Fentanyl  50 mcg was administered intravenously by the radiology nurse. Total intra-service moderate Sedation Time: 17 minutes. The patient's level of consciousness and vital signs were monitored continuously by radiology nursing throughout the procedure under my direct supervision. COMPLICATIONS: None immediate. PROCEDURE: Informed written consent was obtained from the patient after a thorough discussion of the procedural risks, benefits and alternatives. All questions were addressed. Maximal Sterile Barrier Technique was utilized including caps, mask, sterile gowns, sterile gloves, sterile drape, hand hygiene and skin antiseptic. A timeout was performed prior to the initiation of the procedure. CT imaging was performed on the large right retroperitoneal mass was identified. A suitable skin entry site was selected and marked. Local anesthesia was attained by infiltration with 1% lidocaine . A small dermatotomy was made. Under  intermittent CT guidance, a 17 gauge introducer needle was carefully advanced into the posterolateral aspect of the mass. Multiple 18 gauge core biopsies were then obtained coaxially using the bio Pince automated biopsy device. Biopsy specimens were placed in saline and delivered to pathology for further analysis. IMPRESSION: CT-guided core biopsy of right retroperitoneal mass. Electronically Signed   By: Fernando Hoyer M.D.   On: 08/04/2023 12:57  MR ABDOMEN WO CONTRAST Result Date: 08/02/2023 CLINICAL DATA:  Abnormal soft tissue in the right renal fossa on prior imaging. EXAM: MRI ABDOMEN WITHOUT CONTRAST TECHNIQUE: Multiplanar multisequence MR imaging was performed without the administration of intravenous contrast. COMPARISON:  CT stone study 08/01/2023. PET-CT 07/17/2023. Abdomen pelvis CT with contrast 06/14/2023. FINDINGS: Lower chest: No acute findings Hepatobiliary: No focal abnormality in the liver on noncontrast imaging. Gallbladder is nondistended with multiple tiny gallstones evident. No intrahepatic or extrahepatic biliary dilation. Pancreas: No focal mass lesion. No dilatation of the main duct. No intraparenchymal cyst. No peripancreatic edema. Spleen:  No splenomegaly. No suspicious focal mass lesion. Adrenals/Urinary Tract: Left adrenal gland unremarkable. Right adrenal gland not discretely visible. Mild fullness left intrarenal collecting system. Extensive abnormal soft tissue is seen in and around the right kidney and involving the right retroperitoneal space. 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 Punctate areas of signal void in the region of the right renal pelvis may be tiny calcified gallstones are foci of collecting system gas. This tissue shows marked diffusion restriction and tracks anteriorly to encase the descending and transverse duodenum and extends into the central abdominal mesentery. Inferior extent in the  retroperitoneal space tracks down to the pelvis and right greater than left pelvic sidewall although this region is been incompletely visualized on this abdomen MRI. Lack of intravenous contrast hinders assessment. Stomach/Bowel: Tiny hiatal hernia. Stomach is nondilated. Duodenum is nondilated. No small bowel or colonic dilatation within the visualized abdomen. Vascular/Lymphatic: No abdominal aortic aneurysm. Abnormal soft tissue in the right retroperitoneum generates mass-effect on the IVC with anterior displacement in the upper abdomen although signal void in the lumen of the IVC suggests that it remains patent. Other: Areas of free fluid are seen at the inferior tip of the liver and right paracolic gutter. Scattered tiny fluid collections are seen in the retroperitoneal space. Diffuse mesenteric and body wall edema evident. Musculoskeletal: No overtly suspicious marrow signal abnormality. IMPRESSION: 1. Extensive abnormal soft tissue in and around the right kidney and involving the right retroperitoneal space. This tissue tracks anteriorly to encase the descending and transverse duodenum and extends into the central abdominal mesentery. Inferior extent in the retroperitoneal space tracks down to the pelvis and right greater than left pelvic sidewalls although this region has been incompletely visualized on this abdomen MRI. Lack of intravenous contrast hinders assessment. As on the recent PET-CT, given that most of the abnormal tissue shows restricted diffusion, this is likely related to lymphoma given the patient's history although, again, assessment is limited by lack of intravenous contrast material. Tissue sampling would likely prove helpful. 2. 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 Although there does not appear to be a large retroperitoneal fluid collection, underlying component of urine leak is not excluded. Punctate areas of signal void in  the region of the right renal pelvis may be tiny calcified stones or foci of collecting system gas. 3. Small volume intraperitoneal free fluid. 4. Cholelithiasis. 5. Tiny hiatal hernia. 6. Diffuse mesenteric and body wall edema. Electronically Signed   By: Donnal Fusi M.D.   On: 08/02/2023 11:42   CT Renal Stone Study Result Date: 08/01/2023 CLINICAL DATA:  Abdominal/flank pain, stone suspected EXAM: CT ABDOMEN AND PELVIS WITHOUT CONTRAST TECHNIQUE: Multidetector CT imaging of the abdomen and pelvis was performed following the standard protocol without IV contrast. RADIATION DOSE REDUCTION: This exam was performed according to the departmental dose-optimization program  which includes automated exposure control, adjustment of the mA and/or kV according to patient size and/or use of iterative reconstruction technique. COMPARISON:  June 14, 2023 FINDINGS: Of note, the lack of intravenous contrast limits evaluation of the solid organ parenchyma and vascularity. Lower chest: Interlobular septal thickening with patchy ground-glass opacities in the lung bases. No pleural effusion. Hepatobiliary: No mass. Decompressed gallbladder without radiopaque stones or wall thickening. No intrahepatic or extrahepatic biliary ductal dilation. Pancreas: No mass or main ductal dilation. No peripancreatic inflammation or fluid collection. None in the a he air Spleen: Normal size. No mass. Adrenals/Urinary Tract: No left adrenal mass. The right adrenal gland is not well visualized. The right renal parenchyma is not well visualized due to the lack of intravenous contrast and large amount of soft tissue attenuation filling the right renal fossa, measuring 13.7 x 9.7 x 17.8 cm (unchanged from the prior PET-CT, but larger than on the prior CT, where it measured 10.5 x 9.9 x 15.9 cm). Moderate right-sided hydronephrosis with abrupt narrowing in the region of the UPJ, likely due to extrinsic compression. The urinary bladder is completely  decompressed. Stomach/Bowel: The stomach is decompressed without focal abnormality. No small bowel wall thickening or inflammation. No small bowel obstruction. The appendix was not visualized. No right lower quadrant or pericecal inflammatory changes to suggest acute appendicitis. Vascular/Lymphatic: No aortic aneurysm. Diffuse aortoiliac atherosclerosis. Numerous subcentimeter upper retroperitoneal lymph nodes measuring up to 7 mm, likely reactive. Reproductive: Hysterectomy. No concerning adnexal mass. Other: No pneumoperitoneum. A large amount of right retroperitoneal hemorrhage extending into the pelvis with additional hemorrhage extending in throughout the left retroperitoneal space, into the root of the mesentery, and along the left retroperitoneal space. Small volume perihepatic and perisplenic ascites. Musculoskeletal: No acute fracture or destructive lesion. IMPRESSION: 1. Redemonstrated abnormal soft tissue density material filling the right renal fossa, measuring 13.7 x 9.7 x 17.8 cm (unchanged from the prior PET-CT, but larger than the prior CT from March 12, where it measured 10.5 x 9.9 x 5.9 cm). Worsening soft tissue extending throughout the right and left retroperitoneal spaces, into the mesentery, and into the pelvis. Given the appearance on the prior studies, this may represent worsening retroperitoneal hematoma or soft tissue infiltration from possible lymphoma given the patient's history. On the prior PET-CT, the increased radiotracer activity could also be related to underlying collecting system injury in the setting of renal trauma. A multiphase CT of the abdomen and pelvis (with collecting phase imaging), recommended for further characterization. 2. Moderate right-sided hydronephrosis with abrupt narrowing at the UPJ, likely due to extrinsic compression from the perinephric and retroperitoneal soft tissue. 3. Findings in the lung bases, worrisome for either a developing bronchopneumonia, which  includes viral etiologies, or early pulmonary edema. Critical Value/emergent results were called by telephone at the time of interpretation on 08/01/2023 at 3:28 pm to provider DAN FLOYD , who verbally acknowledged these results. Electronically Signed   By: Rance Burrows M.D.   On: 08/01/2023 15:53   DG Chest Port 1 View Result Date: 08/01/2023 CLINICAL DATA:  Cough and shortness of breath. EXAM: PORTABLE CHEST 1 VIEW COMPARISON:  07/30/2023 FINDINGS: Single-view of the chest. Lungs are clear except for questionable mild blunting at the right costophrenic angle. Heart size is within normal limits and stable. Trachea is midline. Degenerative changes at the bilateral AC joints. IMPRESSION: 1. Questionable blunting at the right costophrenic angle. Cannot exclude small right pleural effusion. Otherwise, no acute chest finding. Electronically Signed   By:  Elene Griffes M.D.   On: 08/01/2023 14:17   DG Chest 2 View Result Date: 07/30/2023 CLINICAL DATA:  Initial evaluation for shortness of breath. EXAM: CHEST - 2 VIEW COMPARISON:  Prior radiograph from 07/12/2023. FINDINGS: Transverse heart size stable, and remains within normal limits. Mediastinal silhouette within normal limits. Lungs are hypoinflated with scattered basilar predominant fibrotic lung changes, consistent with history of fibrotic lung disease. Slightly increased prominence of bibasilar interstitial markings could reflect superimposed interstitial edema or possibly atypical pneumonitis. No frank consolidative airspace disease. Trace right pleural effusion. No overt pulmonary edema or pneumothorax. Visualized osseous structures and soft tissues demonstrate no acute finding. IMPRESSION: 1. Hypoinflation with scattered basilar predominant fibrotic lung changes, consistent with history of fibrotic lung disease. 2. Slightly increased prominence of bibasilar interstitial markings as compared to prior, which could reflect superimposed interstitial edema or  possibly atypical pneumonitis. No consolidative airspace disease. 3. Trace right pleural effusion. Electronically Signed   By: Virgia Griffins M.D.   On: 07/30/2023 18:17    Labs:  CBC: Recent Labs    08/04/23 0513 08/09/23 1759 08/15/23 0954 08/18/23 1419  WBC 8.9 11.0* 8.6 9.3  HGB 10.2* 10.0* 10.3* 9.8*  HCT 34.8* 32.6* 34.2* 29.9*  PLT 238 258 244 311    COAGS: Recent Labs    08/02/23 1305  INR 1.3*    BMP: Recent Labs    08/04/23 0509 08/09/23 1759 08/15/23 0954 08/18/23 1419  NA 138 137 135 136  K 4.5 4.3 4.6 4.6  CL 110 109 106 108  CO2 17* 16* 15* 18*  GLUCOSE 90 122* 103* 92  BUN 43* 52* 40* 43*  CALCIUM 10.2 10.3 9.9 10.2  CREATININE 2.14* 2.34* 2.51* 2.91*  GFRNONAA 22* 20* 19* 16*    LIVER FUNCTION TESTS: Recent Labs    08/03/23 0524 08/09/23 1759 08/15/23 0954 08/18/23 1419  BILITOT 0.6 0.9 0.9 0.7  AST 27 26 21 20   ALT 23 19 16 12   ALKPHOS 50 52 47 58  PROT 5.8* 6.0* 6.2* 6.6  ALBUMIN 2.6* 2.8* 2.8* 3.6    TUMOR MARKERS: No results for input(s): "AFPTM", "CEA", "CA199", "CHROMGRNA" in the last 8760 hours.  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  Risks and benefits of image-guided Port-a-catheter placement were discussed with the patient including, but not limited to bleeding, infection, pneumothorax, or fibrin sheath development and need for additional procedures.  All of the patient's questions were answered, patient is agreeable to proceed.  Consent signed and in chart.  Thank you for this interesting consult.  I greatly enjoyed meeting Jacqueline Hancock and look forward to participating in their care.  A copy of this report was sent to the requesting provider on this date.  Electronically Signed: Marilu Shown, PA-C 08/21/2023, 8:27 AM   I spent a total of 30 Minutes  in face to face in clinical consultation, greater  than 50% of which was counseling/coordinating care for B-cell lymphoma.

## 2023-08-21 NOTE — Procedures (Signed)
Interventional Radiology Procedure Note  Procedure: RT internal jugular POWER PORT    Complications: None  Estimated Blood Loss:  MIN  Findings: TIP SVCRA    M. TREVOR Levaeh Vice, MD    

## 2023-08-22 ENCOUNTER — Inpatient Hospital Stay: Admitting: Hematology

## 2023-08-22 ENCOUNTER — Other Ambulatory Visit: Payer: Self-pay | Admitting: Hematology

## 2023-08-22 ENCOUNTER — Inpatient Hospital Stay

## 2023-08-22 ENCOUNTER — Ambulatory Visit: Payer: Self-pay | Admitting: Physician Assistant

## 2023-08-22 ENCOUNTER — Encounter: Payer: Self-pay | Admitting: Hematology

## 2023-08-22 ENCOUNTER — Ambulatory Visit (HOSPITAL_COMMUNITY)
Admission: RE | Admit: 2023-08-22 | Discharge: 2023-08-22 | Disposition: A | Discharge status: Home / Self Care | Source: Ambulatory Visit | Attending: Hematology | Admitting: Hematology

## 2023-08-22 VITALS — BP 133/64 | HR 94 | Temp 97.3°F | Resp 18 | Wt 140.0 lb

## 2023-08-22 DIAGNOSIS — C8338 Diffuse large B-cell lymphoma, lymph nodes of multiple sites: Secondary | ICD-10-CM

## 2023-08-22 DIAGNOSIS — N179 Acute kidney failure, unspecified: Secondary | ICD-10-CM | POA: Diagnosis not present

## 2023-08-22 DIAGNOSIS — I081 Rheumatic disorders of both mitral and tricuspid valves: Secondary | ICD-10-CM | POA: Insufficient documentation

## 2023-08-22 DIAGNOSIS — C9110 Chronic lymphocytic leukemia of B-cell type not having achieved remission: Secondary | ICD-10-CM | POA: Diagnosis not present

## 2023-08-22 DIAGNOSIS — B1910 Unspecified viral hepatitis B without hepatic coma: Secondary | ICD-10-CM

## 2023-08-22 DIAGNOSIS — Z09 Encounter for follow-up examination after completed treatment for conditions other than malignant neoplasm: Secondary | ICD-10-CM | POA: Insufficient documentation

## 2023-08-22 DIAGNOSIS — Z0189 Encounter for other specified special examinations: Secondary | ICD-10-CM | POA: Diagnosis not present

## 2023-08-22 LAB — ECHOCARDIOGRAM COMPLETE
AR max vel: 1.69 cm2
AV Area VTI: 1.86 cm2
AV Area mean vel: 1.85 cm2
AV Mean grad: 4 mmHg
AV Peak grad: 10.9 mmHg
Ao pk vel: 1.65 m/s
Area-P 1/2: 4.33 cm2
Calc EF: 61.5 %
MV M vel: 6.07 m/s
MV Peak grad: 147.4 mmHg
S' Lateral: 2.2 cm
Single Plane A2C EF: 65.9 %
Single Plane A4C EF: 54.5 %

## 2023-08-22 LAB — HEPATITIS B SURFACE ANTIBODY,QUALITATIVE: Hep B S Ab: NONREACTIVE

## 2023-08-22 LAB — HEPATITIS A ANTIBODY, TOTAL: Hepatitis A AB,Total: REACTIVE — AB

## 2023-08-22 LAB — HEPATITIS B SURFACE ANTIGEN: Hepatitis B Surface Ag: REACTIVE — AB

## 2023-08-22 LAB — HEPATITIS B DNA, ULTRAQUANTITATIVE, PCR

## 2023-08-22 LAB — HEPATITIS C ANTIBODY: Hepatitis C Ab: NONREACTIVE

## 2023-08-22 LAB — HEPATITIS B CORE ANTIBODY, TOTAL: Hep B Core Total Ab: REACTIVE — AB

## 2023-08-22 NOTE — Progress Notes (Signed)
 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    Prednisone  60 mg daily in the January 2022 to treat her hemolytic anemia.  This was tapered off after complete hematological response.  HISTORY OF PRESENTING ILLNESS:   Jacqueline Hancock is a wonderful 83 y.o. female who is here for continued evaluation and management of NHL, unspecified body region. Patient was following up with Dr. Dirk Fredericks.   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 BM Bx Cyclin D1 IHC +ve byt FISH neg . She has presented with splenomegaly, anemia, and lymphocytosis. 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   Patient's current treatment is Imbruvica  280 mg. She was started on Imbruvica  560 mg on January 2022, which was then reduced to 280 mg in July 2023.   Patient was last seen by Dr. Dirk Fredericks on 02/10/2022 and she was doing well overall.   Patient reports she has been doing well overall since our last visit. She does complains of itchy skin rashes, mostly around the face and head, for around 2-3 weeks ago. Patient notes that she was started couple of new medications from other physicians, but she is unsure of the names. Patient notes that she was started on oxycodone around 3-4 weeks ago. She discontinued oxycodone.   She notes that  there is redness around her face, which is not new.   Patient is currently taking Imbruvica  280 mg as prescribed and has been tolerating it well without any severe toxicities.   She is currently taking Iron supplement, calcium supplement, and Vitamin-D supplement.   She denies fever, chills, night sweats, unexpected weight loss, back pain, abdominal pain, chest pain, or leg swelling. Patient does complain of occasional abdominal pain.   Patient notes she had a surgery near her right neck/ear area to remove an enlarged lymph node couple years ago. She complains of mild occasional pain near the site.  INTERVAL HISTORY:  Jacqueline Hancock is a wonderful 83 y.o. 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   Patient was last seen by me on 08/16/2023 and complained of abdominal pain/sense of fullness, difficulty swallowing issues, leg/feet swelling, shooting pain from her leg sometimes, and diarrhea.  Patient admitted we will be on chronic renal failure and had a biopsy of her perinephric mass which shows a higher grade lymphoma. Pathology results were discussed with her in detail.  The natural history and treatment options were discussed in detail and we discussed the standard approach of treatment with R-mini CHOP We discussed that due to being positive hepatitis B we will have to hold her Rituxan and refer her to infectious disease. Echo shows normal ejection fraction  MEDICAL HISTORY:  Past Medical History:  Diagnosis Date   CLL (chronic lymphocytic leukemia) (HCC)    Dyspnea 2021   with exersion    Early cataracts, bilateral    Elevated hemoglobin A1c    Fibroids    GERD (  gastroesophageal reflux disease)    Hyperlipidemia    Hypertension    Lymphoma (HCC)    Osteopenia     SURGICAL HISTORY: Past Surgical History:  Procedure Laterality Date   ABDOMINAL HYSTERECTOMY     APPENDECTOMY     CATARACT EXTRACTION, BILATERAL Bilateral 09/2017   Dr. Celina Colla   IR  IMAGING GUIDED PORT INSERTION  08/21/2023   PAROTID GLAND TUMOR EXCISION  2012   benign   TONSILLECTOMY AND ADENOIDECTOMY      SOCIAL HISTORY: Social History   Socioeconomic History   Marital status: Widowed    Spouse name: Not on file   Number of children: 3   Years of education: Not on file   Highest education level: Not on file  Occupational History   Occupation: retired  Tobacco Use   Smoking status: Never   Smokeless tobacco: Never  Vaping Use   Vaping status: Never Used  Substance and Sexual Activity   Alcohol use: No   Drug use: Never   Sexual activity: Not on file  Other Topics Concern   Not on file  Social History Narrative   Not on file   Social Drivers of Health   Financial Resource Strain: Not on file  Food Insecurity: No Food Insecurity (08/02/2023)   Hunger Vital Sign    Worried About Running Out of Food in the Last Year: Never true    Ran Out of Food in the Last Year: Never true  Transportation Needs: No Transportation Needs (08/02/2023)   PRAPARE - Administrator, Civil Service (Medical): No    Lack of Transportation (Non-Medical): No  Physical Activity: Not on file  Stress: Not on file  Social Connections: Unknown (08/03/2023)   Social Connection and Isolation Panel [NHANES]    Frequency of Communication with Friends and Family: More than three times a week    Frequency of Social Gatherings with Friends and Family: Twice a week    Attends Religious Services: More than 4 times per year    Active Member of [employer] or Organizations: Yes    Attends Banker Meetings: More than 4 times per year    Marital Status: Patient unable to answer  Intimate Partner Violence: Not At Risk (08/02/2023)   Humiliation, Afraid, Rape, and Kick questionnaire    Fear of Current or Ex-Partner: No    Emotionally Abused: No    Physically Abused: No    Sexually Abused: No    FAMILY HISTORY: Family History  Problem Relation Age of Onset   Leukemia  Mother    Hypertension Father    Diabetes Sister    Heart disease Brother    Diabetes Brother    Diabetes Brother    Hypertension Son    Heart disease Son     ALLERGIES:  is allergic to nexium [esomeprazole magnesium] and peanut butter flavoring agent (non-screening).  MEDICATIONS:  Current Outpatient Medications  Medication Sig Dispense Refill   allopurinol  (ZYLOPRIM ) 100 MG tablet TAKE 1 TABLET(100 MG) BY MOUTH TWICE DAILY 180 tablet 1   amLODipine  (NORVASC ) 5 MG tablet Take 1 tablet (5 mg total) by mouth daily. 30 tablet 3   amoxicillin -clavulanate (AUGMENTIN ) 875-125 MG tablet Take 1 tablet by mouth every 12 (twelve) hours. 10 tablet 0   azithromycin  (ZITHROMAX ) 250 MG tablet Take 1 tablet (250 mg total) by mouth daily. Starting tomorrow, take 1 every day until finished. 4 tablet 0   B Complex Vitamins (VITAMIN B-COMPLEX PO) Take 1 tablet  by mouth daily.     Ferrous Sulfate  (IRON) 325 (65 Fe) MG TABS Take 325 mg by mouth daily.      furosemide  (LASIX ) 20 MG tablet TAKE 1 TABLET(20 MG) BY MOUTH DAILY 90 tablet 1   lidocaine -prilocaine  (EMLA ) cream Apply to affected area once 30 g 3   Loratadine (CLARITIN PO) Take by mouth daily.     nadolol  (CORGARD ) 20 MG tablet TAKE 1 TABLET BY MOUTH DAILY FOR BLOOD PRESSURE OR TREMORS (Patient taking differently: Take 20 mg by mouth 2 (two) times daily. TAKE 1 TABLET BY MOUTH DAILY FOR BLOOD PRESSURE OR TREMORS) 90 tablet 3   nystatin  (MYCOSTATIN ) 100000 UNIT/ML suspension Take 5 mLs (500,000 Units total) by mouth 4 (four) times daily. 270 mL 1   omeprazole  (PRILOSEC) 20 MG capsule Open capsule and sprinkle on food once daily 30 capsule 3   ondansetron  (ZOFRAN ) 8 MG tablet Take 1 tablet (8 mg total) by mouth every 8 (eight) hours as needed for nausea or vomiting. Start on the third day after cyclophosphamide chemotherapy. 30 tablet 1   polyethylene glycol (MIRALAX  / GLYCOLAX ) 17 g packet Take 17 g by mouth daily as needed for mild constipation or  moderate constipation. Also available OTC 30 each 0   predniSONE  (DELTASONE ) 20 MG tablet Take 3 tablets (60 mg total) by mouth daily with breakfast for 5 days, THEN 2 tablets (40 mg total) daily with breakfast for 5 days. 25 tablet 0   predniSONE  (DELTASONE ) 20 MG tablet Take 3 tablets (60 mg total) by mouth daily. Take with food on days 2-5 of chemotherapy. 25 tablet 5   prochlorperazine  (COMPAZINE ) 10 MG tablet Take 1 tablet (10 mg total) by mouth every 6 (six) hours as needed for nausea or vomiting. 30 tablet 6   senna-docusate (SENOKOT-S) 8.6-50 MG tablet Take 2 tablets by mouth at bedtime. For constipation 60 tablet 1   traMADol  (ULTRAM ) 50 MG tablet Take 1 tablet (50 mg total) by mouth every 8 (eight) hours as needed for moderate pain (pain score 4-6) or severe pain (pain score 7-10) (pain). 20 tablet 0   No current facility-administered medications for this visit.    REVIEW OF SYSTEMS:    10 Point review of Systems was done is negative except as noted above.   PHYSICAL EXAMINATION: ECOG PERFORMANCE STATUS: 2 - Symptomatic, <50% confined to bed  . Vitals:   08/22/23 1308  BP: 133/64  Pulse: 94  Resp: 18  Temp: (!) 97.3 F (36.3 C)   Filed Weights   08/22/23 1308  Weight: 140 lb (63.5 kg)  .Body mass index is 28.28 kg/m.  GENERAL:alert, in no acute distress and comfortable SKIN: no acute rashes, no significant lesions EYES: conjunctiva are pink and non-injected, sclera anicteric OROPHARYNX: MMM, no exudates, no oropharyngeal erythema or ulceration NECK: supple, no JVD LYMPH:  no palpable lymphadenopathy in the cervical, axillary or inguinal regions LUNGS: clear to auscultation b/l with normal respiratory effort HEART: regular rate & rhythm ABDOMEN:  normoactive bowel sounds , non tender, not distended. Extremity: no pedal edema PSYCH: alert & oriented x 3 with fluent speech NEURO: no focal motor/sensory deficits   LABORATORY DATA:  I have reviewed the data as  listed  .    Latest Ref Rng & Units 08/18/2023    2:19 PM 08/15/2023    9:54 AM 08/09/2023    5:59 PM  CBC  WBC 4.0 - 10.5 K/uL 9.3  8.6  11.0   Hemoglobin 12.0 -  15.0 g/dL 9.8  40.9  81.1   Hematocrit 36.0 - 46.0 % 29.9  34.2  32.6   Platelets 150 - 400 K/uL 311  244  258    .    Latest Ref Rng & Units 08/18/2023    2:19 PM 08/15/2023    9:54 AM 08/09/2023    5:59 PM  CMP  Glucose 70 - 99 mg/dL 92  914  782   BUN 8 - 23 mg/dL 43  40  52   Creatinine 0.44 - 1.00 mg/dL 9.56  2.13  0.86   Sodium 135 - 145 mmol/L 136  135  137   Potassium 3.5 - 5.1 mmol/L 4.6  4.6  4.3   Chloride 98 - 111 mmol/L 108  106  109   CO2 22 - 32 mmol/L 18  15  16    Calcium 8.9 - 10.3 mg/dL 57.8  9.9  46.9   Total Protein 6.5 - 8.1 g/dL 6.6  6.2  6.0   Total Bilirubin 0.0 - 1.2 mg/dL 0.7  0.9  0.9   Alkaline Phos 38 - 126 U/L 58  47  52   AST 15 - 41 U/L 20  21  26    ALT 0 - 44 U/L 12  16  19     . Lab Results  Component Value Date   LDH 326 (H) 08/04/2023   Component     Latest Ref Rng 08/18/2023  HBV DNA SERPL PCR-ACNC     IU/mL 110   HBV DNA SERPL PCR-LOG IU     log10 IU/mL 2.041   Test Info: Comment   Hepatitis A AB,Total     NON-REACTIVE  REACTIVE !   HCV Ab     NON REACTIVE  NON REACTIVE   HEP B CORE AB     Negative  Positive !   Hepatitis B Surface Ag     NON REACTIVE  Reactive !   Uric Acid, Serum     2.5 - 7.1 mg/dL     Legend: ! Abnormal   PATHOLOGY Surgical Pathology CASE: [id number] PATIENT: Jacqueline Hancock Bone Marrow Report     Clinical History: lymphocytosis, possible lymphoma     DIAGNOSIS:  BONE MARROW, ASPIRATE, CLOT, CORE: -Hypercellular bone marrow with extensive involvement by a B-cell lymphoproliferative disorder -See comment  PERIPHERAL BLOOD: -Macrocytic anemia -Marked lymphocytosis consistent with lymphoproliferative disorder  COMMENT:  There is prominent involvement by a B-cell lymphoproliferative process associated with cyclin D1 positivity  and partial expression of CD5.  The latter is primarily seen by flow cytometry.  The findings favor mantle cell lymphoma but correlation with cytogenetic and FISH studies is recommended.  MICROSCOPIC DESCRIPTION:  PERIPHERAL BLOOD SMEAR: The red blood cells display prominent anisocytosis with macrocytic and normocytic cells.  There is mild to moderate poikilocytosis with teardrop cells, elliptocytes.  There is moderate polychromasia.  The white blood cells are increased in number with lymphocytosis characterized by predominance of small to medium sized lymphoid cells displaying high nuclear cytoplasmic ratio, round to irregular nuclei, coarse chromatin and small to inconspicuous nucleoli. The platelets are normal in number.     RADIOGRAPHIC STUDIES: I have personally reviewed the radiological images as listed and agreed with the findings in the report. IR IMAGING GUIDED PORT INSERTION Result Date: 08/21/2023 CLINICAL DATA:  B-CELL LYMPHOMA, ACCESS FOR CHEMOTHERAPY EXAM: RIGHT INTERNAL JUGULAR SINGLE LUMEN POWER PORT CATHETER INSERTION Date:  08/21/2023 08/21/2023 9:33 am Radiologist:  M. Alyssa Jumper, MD Guidance:  Ultrasound  and fluoroscopic MEDICATIONS: 1% lidocaine  local with epinephrine  ANESTHESIA/SEDATION: Versed  1.0 mg IV; Fentanyl  50 mcg IV; Moderate Sedation Time:  28 minute The patient was continuously monitored during the procedure by the interventional radiology nurse under my direct supervision. FLUOROSCOPY: One minutes, 24 seconds (15 mGy) COMPLICATIONS: None immediate. CONTRAST:  None. PROCEDURE: Informed consent was obtained from the patient following explanation of the procedure, risks, benefits and alternatives. The patient understands, agrees and consents for the procedure. All questions were addressed. A time out was performed. Maximal barrier sterile technique utilized including caps, mask, sterile gowns, sterile gloves, large sterile drape, hand hygiene, and 2% chlorhexidine  scrub. Under sterile conditions and local anesthesia, right internal JV micropuncture venous access was performed. Access was performed with ultrasound. Images were obtained for documentation of the patent right internal jugular vein. A guide wire was inserted followed by a transitional dilator. This allowed insertion of a guide wire and catheter into the IVC. Measurements were obtained from the SVC / RA junction back to the right IJ venotomy site. In the right infraclavicular chest, a subcutaneous pocket was created over the second anterior rib. This was done under sterile conditions and local anesthesia. 1% lidocaine  with epinephrine  was utilized for this. A 2.5 cm incision was made in the skin. Blunt dissection was performed to create a subcutaneous pocket over the right pectoralis major muscle. The pocket was flushed with saline vigorously. There was adequate hemostasis. The port catheter was assembled and checked for leakage. The port catheter was secured in the pocket with two retention sutures. The tubing was tunneled subcutaneously to the right venotomy site and inserted into the SVC/RA junction through a valved peel-away sheath. Position was confirmed with fluoroscopy. Images were obtained for documentation. The patient tolerated the procedure well. No immediate complications. Incisions were closed in a two layer fashion with 4 - 0 Vicryl suture. Dermabond was applied to the skin. The port catheter was accessed, blood was aspirated followed by saline and heparin  flushes. Needle was removed. A dry sterile dressing was applied. IMPRESSION: Ultrasound and fluoroscopically guided right internal jugular single lumen power port catheter insertion. Tip in the SVC/RA junction. Catheter ready for use. Electronically Signed   By: Melven Stable.  Shick M.D.   On: 08/21/2023 09:57   DG ESOPHAGUS W SINGLE CM (SOL OR THIN BA) Addendum Date: 08/19/2023 ADDENDUM REPORT: 08/19/2023 08:35 ADDENDUM: Addendum for clarification of body  of report. Hiatus hernia without evidence of stricture. Electronically Signed   By: Art Largo M.D.   On: 08/19/2023 08:35   Result Date: 08/19/2023 CLINICAL DATA:  dysphagia 83 year old female with dysphagia and globus sensation for esophagram. EXAM: ESOPHAGUS/BARIUM SWALLOW/TABLET STUDY TECHNIQUE: Single contrast examination was performed using thin liquid barium. This exam was performed by Abigail C. Emerson, PA-C, and was supervised and interpreted by Dr. Darylene Epley. FLUOROSCOPY: Radiation Exposure Index and estimated peak skin dose (PSD); Reference air kerma (RAK), 29.4 mGy. Kerma-area product (KAP), 409.9 uGy*m. COMPARISON:  CT AP, 12/02/2022 FINDINGS: Swallowing: Appears normal. No vestibular penetration or aspiration seen. Pharynx: Unremarkable. Esophagus: No mucosal lesions. Smooth, tapering stricture above gastroesophageal junction. Esophageal motility: Moderate esophageal dysmotility visualized with resultant contrast stasis and delayed emptying into the stomach. Hiatal Hernia: Mild hiatal hernia. Gastroesophageal reflux: None visualized, even with provacative maneuvers. Ingested 13mm barium tablet: Became stuck just above the gastroesophageal junction, where the tapering stricture is visualized. Waited 3 minutes with subsequent swallows of water and barium, tablet remained stuck. Pt informed the tablet will dissolve on its  own. Other: None. IMPRESSION: Suboptimal evaluation, secondary to patient's inability to remain upright. 1. Small hiatus hernia. 2. 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 Performed By Lorinda Root, PA-C and supervised by Art Largo, MD Electronically Signed: By: Art Largo M.D. On: 08/18/2023 09:35   DG Chest 2 View Result Date: 08/15/2023 CLINICAL DATA:  Shortness of breath. EXAM: CHEST - 2 VIEW COMPARISON:  Aug 09, 2023. FINDINGS: The heart size and mediastinal contours are within normal limits. Both lungs are  clear. The visualized skeletal structures are unremarkable. IMPRESSION: No active cardiopulmonary disease. Electronically Signed   By: Rosalene Colon M.D.   On: 08/15/2023 11:25   CT ABDOMEN PELVIS WO CONTRAST Result Date: 08/09/2023 CLINICAL DATA:  Abdominal pain no known history of lymphoma and recently positive biopsy EXAM: CT ABDOMEN AND PELVIS WITHOUT CONTRAST TECHNIQUE: Multidetector CT imaging of the abdomen and pelvis was performed following the standard protocol without IV contrast. RADIATION DOSE REDUCTION: This exam was performed according to the departmental dose-optimization program which includes automated exposure control, adjustment of the mA and/or kV according to patient size and/or use of iterative reconstruction technique. COMPARISON:  08/01/2023 FINDINGS: Lower chest: Previously seen patchy ground-glass opacities are again seen but improved when compared with the prior study. Hepatobiliary: Persistent perihepatic fluid is noted. The gallbladder is within normal limits. Pancreas: Unremarkable. No pancreatic ductal dilatation or surrounding inflammatory changes. Spleen: Normal in size without focal abnormality. Mild perisplenic fluid is noted. Adrenals/Urinary Tract: Left adrenal gland is within normal limits. Left kidney shows no acute abnormality. No obstructive changes are seen.The right kidney is envelop by a large soft tissue mass stable in appearance from the recent exam as well as a recent MRI. 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 The surrounding soft tissues extend inferiorly similar to that seen on the prior exam. The bladder is decompressed. Stomach/Bowel: No obstructive or inflammatory changes of the colon are seen. The appendix is not well visualized. Small bowel and stomach are unremarkable. Vascular/Lymphatic: Aortic atherosclerosis. No enlarged abdominal or pelvic lymph nodes. Reproductive:  Uterus has been surgically removed. Other: No abdominal wall hernia or abnormality. No abdominopelvic ascites. Musculoskeletal: No acute bony abnormality is noted. IMPRESSION: Stable appearing soft tissue mass lesion in the right abdomen envelop ping the right kidney with mild hydronephrosis as well as extending superiorly towards the liver and inferiorly into the pelvis. 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 Mild free fluid is noted within the abdomen stable from the prior study. No new focal abnormality is seen. Electronically Signed   By: Violeta Grey M.D.   On: 08/09/2023 22:51   DG Chest 2 View Result Date: 08/09/2023 CLINICAL DATA:  Shortness of breath EXAM: CHEST - 2 VIEW COMPARISON:  08/01/2023 FINDINGS: Heart mediastinal contours within normal limits. Questionable ill-defined patchy lower lobe opacities. No visible effusions or pneumothorax. No acute bony abnormality. IMPRESSION: Questionable vague lower lobe opacities, possible atelectasis or pneumonia. Electronically Signed   By: Janeece Mechanic M.D.   On: 08/09/2023 21:14   CT ABDOMINAL MASS BIOPSY Result Date: 08/04/2023 INDICATION: Retroperitoneal mass.  Personal history of lymphoma. EXAM: CT-guided core biopsy of right retroperitoneal mass MEDICATIONS: None. ANESTHESIA/SEDATION: Moderate (conscious) sedation was employed during this procedure. A total of Versed  1 mg and Fentanyl  50 mcg was administered intravenously by the radiology nurse. Total intra-service moderate Sedation Time: 17 minutes. The patient's level of consciousness and vital  signs were monitored continuously by radiology nursing throughout the procedure under my direct supervision. COMPLICATIONS: None immediate. PROCEDURE: Informed written consent was obtained from the patient after a thorough discussion of the procedural risks, benefits and alternatives. All questions were addressed. Maximal Sterile Barrier Technique was utilized including caps, mask, sterile  gowns, sterile gloves, sterile drape, hand hygiene and skin antiseptic. A timeout was performed prior to the initiation of the procedure. CT imaging was performed on the large right retroperitoneal mass was identified. A suitable skin entry site was selected and marked. Local anesthesia was attained by infiltration with 1% lidocaine . A small dermatotomy was made. Under intermittent CT guidance, a 17 gauge introducer needle was carefully advanced into the posterolateral aspect of the mass. Multiple 18 gauge core biopsies were then obtained coaxially using the bio Pince automated biopsy device. Biopsy specimens were placed in saline and delivered to pathology for further analysis. IMPRESSION: CT-guided core biopsy of right retroperitoneal mass. Electronically Signed   By: Fernando Hoyer M.D.   On: 08/04/2023 12:57   MR ABDOMEN WO CONTRAST Result Date: 08/02/2023 CLINICAL DATA:  Abnormal soft tissue in the right renal fossa on prior imaging. EXAM: MRI ABDOMEN WITHOUT CONTRAST TECHNIQUE: Multiplanar multisequence MR imaging was performed without the administration of intravenous contrast. COMPARISON:  CT stone study 08/01/2023. PET-CT 07/17/2023. Abdomen pelvis CT with contrast 06/14/2023. FINDINGS: Lower chest: No acute findings Hepatobiliary: No focal abnormality in the liver on noncontrast imaging. Gallbladder is nondistended with multiple tiny gallstones evident. No intrahepatic or extrahepatic biliary dilation. Pancreas: No focal mass lesion. No dilatation of the main duct. No intraparenchymal cyst. No peripancreatic edema. Spleen:  No splenomegaly. No suspicious focal mass lesion. Adrenals/Urinary Tract: Left adrenal gland unremarkable. Right adrenal gland not discretely visible. Mild fullness left intrarenal collecting system. Extensive abnormal soft tissue is seen in and around the right kidney and involving the right retroperitoneal space. 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 Punctate areas of signal void in the region of the right renal pelvis may be tiny calcified gallstones are foci of collecting system gas. This tissue shows marked diffusion restriction and tracks anteriorly to encase the descending and transverse duodenum and extends into the central abdominal mesentery. Inferior extent in the retroperitoneal space tracks down to the pelvis and right greater than left pelvic sidewall although this region is been incompletely visualized on this abdomen MRI. Lack of intravenous contrast hinders assessment. Stomach/Bowel: Tiny hiatal hernia. Stomach is nondilated. Duodenum is nondilated. No small bowel or colonic dilatation within the visualized abdomen. Vascular/Lymphatic: No abdominal aortic aneurysm. Abnormal soft tissue in the right retroperitoneum generates mass-effect on the IVC with anterior displacement in the upper abdomen although signal void in the lumen of the IVC suggests that it remains patent. Other: Areas of free fluid are seen at the inferior tip of the liver and right paracolic gutter. Scattered tiny fluid collections are seen in the retroperitoneal space. Diffuse mesenteric and body wall edema evident. Musculoskeletal: No overtly suspicious marrow signal abnormality. IMPRESSION: 1. Extensive abnormal soft tissue in and around the right kidney and involving the right retroperitoneal space. This tissue tracks anteriorly to encase the descending and transverse duodenum and extends into the central abdominal mesentery. Inferior extent in the retroperitoneal space tracks down to the pelvis and right greater than left pelvic sidewalls although this region has been incompletely visualized on this abdomen MRI. Lack of intravenous contrast hinders assessment. As on the recent PET-CT, given that most  of the abnormal tissue shows restricted diffusion, this is likely related to lymphoma given the patient's history although, again,  assessment is limited by lack of intravenous contrast material. Tissue sampling would likely prove helpful. 2. 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 Although there does not appear to be a large retroperitoneal fluid collection, underlying component of urine leak is not excluded. Punctate areas of signal void in the region of the right renal pelvis may be tiny calcified stones or foci of collecting system gas. 3. Small volume intraperitoneal free fluid. 4. Cholelithiasis. 5. Tiny hiatal hernia. 6. Diffuse mesenteric and body wall edema. Electronically Signed   By: Donnal Fusi M.D.   On: 08/02/2023 11:42   CT Renal Stone Study Result Date: 08/01/2023 CLINICAL DATA:  Abdominal/flank pain, stone suspected EXAM: CT ABDOMEN AND PELVIS WITHOUT CONTRAST TECHNIQUE: Multidetector CT imaging of the abdomen and pelvis was performed following the standard protocol without IV contrast. RADIATION DOSE REDUCTION: This exam was performed according to the departmental dose-optimization program which includes automated exposure control, adjustment of the mA and/or kV according to patient size and/or use of iterative reconstruction technique. COMPARISON:  June 14, 2023 FINDINGS: Of note, the lack of intravenous contrast limits evaluation of the solid organ parenchyma and vascularity. Lower chest: Interlobular septal thickening with patchy ground-glass opacities in the lung bases. No pleural effusion. Hepatobiliary: No mass. Decompressed gallbladder without radiopaque stones or wall thickening. No intrahepatic or extrahepatic biliary ductal dilation. Pancreas: No mass or main ductal dilation. No peripancreatic inflammation or fluid collection. None in the a he air Spleen: Normal size. No mass. Adrenals/Urinary Tract: No left adrenal mass. The right adrenal gland is not well visualized. The right renal parenchyma is not well visualized due to the lack of intravenous  contrast and large amount of soft tissue attenuation filling the right renal fossa, measuring 13.7 x 9.7 x 17.8 cm (unchanged from the prior PET-CT, but larger than on the prior CT, where it measured 10.5 x 9.9 x 15.9 cm). Moderate right-sided hydronephrosis with abrupt narrowing in the region of the UPJ, likely due to extrinsic compression. The urinary bladder is completely decompressed. Stomach/Bowel: The stomach is decompressed without focal abnormality. No small bowel wall thickening or inflammation. No small bowel obstruction. The appendix was not visualized. No right lower quadrant or pericecal inflammatory changes to suggest acute appendicitis. Vascular/Lymphatic: No aortic aneurysm. Diffuse aortoiliac atherosclerosis. Numerous subcentimeter upper retroperitoneal lymph nodes measuring up to 7 mm, likely reactive. Reproductive: Hysterectomy. No concerning adnexal mass. Other: No pneumoperitoneum. A large amount of right retroperitoneal hemorrhage extending into the pelvis with additional hemorrhage extending in throughout the left retroperitoneal space, into the root of the mesentery, and along the left retroperitoneal space. Small volume perihepatic and perisplenic ascites. Musculoskeletal: No acute fracture or destructive lesion. IMPRESSION: 1. Redemonstrated abnormal soft tissue density material filling the right renal fossa, measuring 13.7 x 9.7 x 17.8 cm (unchanged from the prior PET-CT, but larger than the prior CT from March 12, where it measured 10.5 x 9.9 x 5.9 cm). Worsening soft tissue extending throughout the right and left retroperitoneal spaces, into the mesentery, and into the pelvis. Given the appearance on the prior studies, this may represent worsening retroperitoneal hematoma or soft tissue infiltration from possible lymphoma given the patient's history. On the prior PET-CT, the increased radiotracer activity could also be related to underlying collecting system injury in the setting of  renal trauma. A multiphase CT of the abdomen  and pelvis (with collecting phase imaging), recommended for further characterization. 2. Moderate right-sided hydronephrosis with abrupt narrowing at the UPJ, likely due to extrinsic compression from the perinephric and retroperitoneal soft tissue. 3. Findings in the lung bases, worrisome for either a developing bronchopneumonia, which includes viral etiologies, or early pulmonary edema. Critical Value/emergent results were called by telephone at the time of interpretation on 08/01/2023 at 3:28 pm to provider DAN FLOYD , who verbally acknowledged these results. Electronically Signed   By: Rance Burrows M.D.   On: 08/01/2023 15:53   DG Chest Port 1 View Result Date: 08/01/2023 CLINICAL DATA:  Cough and shortness of breath. EXAM: PORTABLE CHEST 1 VIEW COMPARISON:  07/30/2023 FINDINGS: Single-view of the chest. Lungs are clear except for questionable mild blunting at the right costophrenic angle. Heart size is within normal limits and stable. Trachea is midline. Degenerative changes at the bilateral AC joints. IMPRESSION: 1. Questionable blunting at the right costophrenic angle. Cannot exclude small right pleural effusion. Otherwise, no acute chest finding. Electronically Signed   By: Elene Griffes M.D.   On: 08/01/2023 14:17   DG Chest 2 View Result Date: 07/30/2023 CLINICAL DATA:  Initial evaluation for shortness of breath. EXAM: CHEST - 2 VIEW COMPARISON:  Prior radiograph from 07/12/2023. FINDINGS: Transverse heart size stable, and remains within normal limits. Mediastinal silhouette within normal limits. Lungs are hypoinflated with scattered basilar predominant fibrotic lung changes, consistent with history of fibrotic lung disease. Slightly increased prominence of bibasilar interstitial markings could reflect superimposed interstitial edema or possibly atypical pneumonitis. No frank consolidative airspace disease. Trace right pleural effusion. No overt pulmonary  edema or pneumothorax. Visualized osseous structures and soft tissues demonstrate no acute finding. IMPRESSION: 1. Hypoinflation with scattered basilar predominant fibrotic lung changes, consistent with history of fibrotic lung disease. 2. Slightly increased prominence of bibasilar interstitial markings as compared to prior, which could reflect superimposed interstitial edema or possibly atypical pneumonitis. No consolidative airspace disease. 3. Trace right pleural effusion. Electronically Signed   By: Virgia Griffins M.D.   On: 07/30/2023 18:17    ASSESSMENT & PLAN:   83 y.o. woman with:   1.  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 Though discussed with patient that FISH was neg and so Mantle cell lymphoma not confirm. Findings more consistent with CLL.  2.  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  3.  High risk of tumor lysis syndrome  4.  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  5) Hypercalcemia - ? From dehydration vs recurrent lymphoma  6) hepatitis B chronic active disease  PLAN:  -discussed lab results from today, 08/22/23, in detail with patient  - Since patient has chronic active hepatitis B her Rituxan has been held. --Referral to infectious disease for management of her chronic active hepatitis B - Patient was on the mini CHOP at this time with G-CSF support - Echo showed normal ejection fraction - Patient was recommended to consume at least 2 L of water daily - She is on allopurinol  for TLS prophylaxis - She has Zofran  and Compazine  to use as needed for nausea and vomiting - Follow-up with PCP and nephrology  FOLLOW-UP: Follow-up for manage follow-up as scheduled with G-CSF .  Continue with port flush and labs with Dr. Salomon Cree in 2 weeks  for toxicity check .referral  to infectious disease for chronci Hep B B mx  The total time spent in the appointment was 40 minutes* .  All of the patient's questions were answered with apparent satisfaction. The patient knows to call the clinic with any problems, questions or concerns.   Jacquelyn Matt MD MS AAHIVMS [facility] Hematology/Oncology Physician [facility]  .*Total Encounter Time as defined by the Centers for Medicare and Medicaid Services includes, in addition to the face-to-face time of a patient visit (documented in the note above) non-face-to-face time: obtaining and reviewing outside history, ordering and reviewing medications, tests or procedures, care coordination (communications with other health care professionals or caregivers) and documentation in the medical record.    I,Mitra Faeizi,acting as a Neurosurgeon for Jacquelyn Matt, MD.,have documented all relevant documentation on the behalf of Jacquelyn Matt, MD,as directed by  Jacquelyn Matt, MD while in the presence of Jacquelyn Matt, MD.  .I have reviewed the above documentation for accuracy and completeness, and I agree with the above. .Gautam Kishore Kale MD

## 2023-08-22 NOTE — Progress Notes (Signed)
*  PRELIMINARY RESULTS* Echocardiogram 2D Echocardiogram has been performed.  [name] D [name] 08/22/2023, 11:00 AM

## 2023-08-22 NOTE — Progress Notes (Signed)
 Patient seen by Dr. Minnie Amber are within treatment parameters.  Labs reviewed: and are not all within treatment parameters.  Dr Salomon Cree is aware pt is Hep B positive. Pt will be referred to ID Dr Salomon Cree aware CR: [id number],    Per physician team, patient is ready for treatment. Please note that modifications are being made to the treatment plan including   Pt will not be getting Rituxan due to Hep B being positive. Pt ok for tx on 08/23/23

## 2023-08-23 ENCOUNTER — Inpatient Hospital Stay

## 2023-08-23 ENCOUNTER — Encounter: Payer: Self-pay | Admitting: Hematology

## 2023-08-23 VITALS — BP 132/68 | HR 88 | Temp 98.2°F | Resp 18

## 2023-08-23 DIAGNOSIS — C9110 Chronic lymphocytic leukemia of B-cell type not having achieved remission: Secondary | ICD-10-CM | POA: Diagnosis not present

## 2023-08-23 DIAGNOSIS — C8338 Diffuse large B-cell lymphoma, lymph nodes of multiple sites: Secondary | ICD-10-CM

## 2023-08-23 MED ORDER — DOXORUBICIN HCL CHEMO IV INJECTION 2 MG/ML
25.0000 mg/m2 | Freq: Once | INTRAVENOUS | Status: AC
  Administered 2023-08-23: 42 mg via INTRAVENOUS
  Filled 2023-08-23: qty 21

## 2023-08-23 MED ORDER — SODIUM CHLORIDE 0.9 % IV SOLN
150.0000 mg | Freq: Once | INTRAVENOUS | Status: AC
  Administered 2023-08-23: 150 mg via INTRAVENOUS
  Filled 2023-08-23: qty 150
  Filled 2023-08-23: qty 5

## 2023-08-23 MED ORDER — PALONOSETRON HCL INJECTION 0.25 MG/5ML
0.2500 mg | Freq: Once | INTRAVENOUS | Status: AC
  Administered 2023-08-23: 0.25 mg via INTRAVENOUS
  Filled 2023-08-23: qty 5

## 2023-08-23 MED ORDER — DEXAMETHASONE SODIUM PHOSPHATE 10 MG/ML IJ SOLN
10.0000 mg | Freq: Once | INTRAMUSCULAR | Status: AC
  Administered 2023-08-23: 10 mg via INTRAVENOUS
  Filled 2023-08-23: qty 1

## 2023-08-23 MED ORDER — DIPHENHYDRAMINE HCL 25 MG PO CAPS
50.0000 mg | ORAL_CAPSULE | Freq: Once | ORAL | Status: AC
  Administered 2023-08-23: 50 mg via ORAL
  Filled 2023-08-23: qty 2

## 2023-08-23 MED ORDER — VINCRISTINE SULFATE CHEMO INJECTION 1 MG/ML
1.0000 mg | Freq: Once | INTRAVENOUS | Status: AC
  Administered 2023-08-23: 1 mg via INTRAVENOUS
  Filled 2023-08-23: qty 1

## 2023-08-23 MED ORDER — SODIUM CHLORIDE 0.9 % IV SOLN: INTRAVENOUS | Status: DC

## 2023-08-23 MED ORDER — ACETAMINOPHEN 325 MG PO TABS
650.0000 mg | ORAL_TABLET | Freq: Once | ORAL | Status: AC
  Administered 2023-08-23: 650 mg via ORAL
  Filled 2023-08-23: qty 2

## 2023-08-23 MED ORDER — SODIUM CHLORIDE 0.9 % IV SOLN
400.0000 mg/m2 | Freq: Once | INTRAVENOUS | Status: AC
  Administered 2023-08-23: 660 mg via INTRAVENOUS
  Filled 2023-08-23: qty 33

## 2023-08-23 NOTE — Patient Instructions (Signed)
 [facility] - A DEPT OF [facility]  Discharge Instructions: Thank you for choosing [facility] to provide your oncology and hematology care.   If you have a lab appointment with the Cancer Center, please go directly to the Cancer Center and check in at the registration area.   Wear comfortable clothing and clothing appropriate for easy access to any Portacath or PICC line.   We strive to give you quality time with your provider. You may need to reschedule your appointment if you arrive late (15 or more minutes).  Arriving late affects you and other patients whose appointments are after yours.  Also, if you miss three or more appointments without notifying the office, you may be dismissed from the clinic at the provider's discretion.      For prescription refill requests, have your pharmacy contact our office and allow 72 hours for refills to be completed.    Today you received the following chemotherapy and/or immunotherapy agents Adriamycin. Oncovin, Cytoxan    To help prevent nausea and vomiting after your treatment, we encourage you to take your nausea medication as directed.  BELOW ARE SYMPTOMS THAT SHOULD BE REPORTED IMMEDIATELY: *FEVER GREATER THAN 100.4 F (38 C) OR HIGHER *CHILLS OR SWEATING *NAUSEA AND VOMITING THAT IS NOT CONTROLLED WITH YOUR NAUSEA MEDICATION *UNUSUAL SHORTNESS OF BREATH *UNUSUAL BRUISING OR BLEEDING *URINARY PROBLEMS (pain or burning when urinating, or frequent urination) *BOWEL PROBLEMS (unusual diarrhea, constipation, pain near the anus) TENDERNESS IN MOUTH AND THROAT WITH OR WITHOUT PRESENCE OF ULCERS (sore throat, sores in mouth, or a toothache) UNUSUAL RASH, SWELLING OR PAIN  UNUSUAL VAGINAL DISCHARGE OR ITCHING   Items with * indicate a potential emergency and should be followed up as soon as possible or go to the Emergency Department if any problems should occur.  Please show the CHEMOTHERAPY ALERT CARD  or IMMUNOTHERAPY ALERT CARD at check-in to the Emergency Department and triage nurse.  Should you have questions after your visit or need to cancel or reschedule your appointment, please contact [facility] - A DEPT OF [facility]  Dept: [phone]  and follow the prompts.  Office hours are 8:00 a.m. to 4:30 p.m. Monday - Friday. Please note that voicemails left after 4:00 p.m. may not be returned until the following business day.  We are closed weekends and major holidays. You have access to a nurse at all times for urgent questions. Please call the main number to the clinic Dept: [phone] and follow the prompts.   For any non-urgent questions, you may also contact your provider using MyChart. We now offer e-Visits for anyone 62 and older to request care online for non-urgent symptoms. For details visit [email or web address].   Also download the MyChart app! Go to the app store, search "MyChart", open the app, select , and log in with your MyChart username and password.

## 2023-08-24 ENCOUNTER — Telehealth: Payer: Self-pay

## 2023-08-24 NOTE — Telephone Encounter (Signed)
-----   Message from Nurse Reymundo Caulk E sent at 08/23/2023 11:00 AM EDT ----- Regarding: first time treatment call back-kale-Adriamycin. Oncovin, Cytoxan Patient received her first treatment today. She is followed by Dr. Salomon Cree. Treatment went well with no issues. Adriamycin. Oncovin, Cytoxan

## 2023-08-24 NOTE — Telephone Encounter (Signed)
 Jacqueline Hancock  states that she is doing pretty well.. She is eating, drinking, and urinating well. She is experiencing some heart burn.  She has not taken Prilosec this am yet. Told her to be sure to take the prednisone  with food and this can irritate the stomach. She can take the compazine  if the heartburn persists after taking Prilosec as sometimes nausea can feel like heartburn. Pt verbalized understanding.  She knows to call the office at [phone] if  she has any questions or concerns.

## 2023-08-25 ENCOUNTER — Inpatient Hospital Stay

## 2023-08-25 VITALS — BP 137/55 | HR 86 | Temp 97.6°F | Resp 17

## 2023-08-25 DIAGNOSIS — E883 Tumor lysis syndrome: Secondary | ICD-10-CM | POA: Diagnosis not present

## 2023-08-25 DIAGNOSIS — R531 Weakness: Secondary | ICD-10-CM | POA: Diagnosis not present

## 2023-08-25 DIAGNOSIS — C8338 Diffuse large B-cell lymphoma, lymph nodes of multiple sites: Secondary | ICD-10-CM

## 2023-08-25 MED ORDER — PEGFILGRASTIM-CBQV 6 MG/0.6ML ~~LOC~~ SOSY
6.0000 mg | PREFILLED_SYRINGE | Freq: Once | SUBCUTANEOUS | Status: AC
  Administered 2023-08-25: 6 mg via SUBCUTANEOUS
  Filled 2023-08-25: qty 0.6

## 2023-08-28 ENCOUNTER — Encounter (HOSPITAL_COMMUNITY): Payer: Self-pay | Admitting: Internal Medicine

## 2023-08-28 ENCOUNTER — Inpatient Hospital Stay (HOSPITAL_COMMUNITY)
Admission: EM | Admit: 2023-08-28 | Discharge: 2023-09-05 | DRG: 683 | Disposition: A | Discharge status: Home / Self Care | Attending: Internal Medicine | Admitting: Internal Medicine

## 2023-08-28 ENCOUNTER — Other Ambulatory Visit: Payer: Self-pay

## 2023-08-28 ENCOUNTER — Emergency Department (HOSPITAL_COMMUNITY)

## 2023-08-28 DIAGNOSIS — Z806 Family history of leukemia: Secondary | ICD-10-CM | POA: Diagnosis not present

## 2023-08-28 DIAGNOSIS — C8338 Diffuse large B-cell lymphoma, lymph nodes of multiple sites: Secondary | ICD-10-CM | POA: Diagnosis present

## 2023-08-28 DIAGNOSIS — R531 Weakness: Secondary | ICD-10-CM | POA: Diagnosis present

## 2023-08-28 DIAGNOSIS — Z8572 Personal history of non-Hodgkin lymphomas: Secondary | ICD-10-CM

## 2023-08-28 DIAGNOSIS — D72829 Elevated white blood cell count, unspecified: Secondary | ICD-10-CM | POA: Insufficient documentation

## 2023-08-28 DIAGNOSIS — R627 Adult failure to thrive: Secondary | ICD-10-CM | POA: Diagnosis present

## 2023-08-28 DIAGNOSIS — N179 Acute kidney failure, unspecified: Secondary | ICD-10-CM | POA: Diagnosis present

## 2023-08-28 DIAGNOSIS — M8580 Other specified disorders of bone density and structure, unspecified site: Secondary | ICD-10-CM | POA: Diagnosis present

## 2023-08-28 DIAGNOSIS — Z90710 Acquired absence of both cervix and uterus: Secondary | ICD-10-CM

## 2023-08-28 DIAGNOSIS — K269 Duodenal ulcer, unspecified as acute or chronic, without hemorrhage or perforation: Secondary | ICD-10-CM | POA: Diagnosis present

## 2023-08-28 DIAGNOSIS — Z79899 Other long term (current) drug therapy: Secondary | ICD-10-CM | POA: Diagnosis not present

## 2023-08-28 DIAGNOSIS — K219 Gastro-esophageal reflux disease without esophagitis: Secondary | ICD-10-CM | POA: Diagnosis present

## 2023-08-28 DIAGNOSIS — K222 Esophageal obstruction: Secondary | ICD-10-CM | POA: Diagnosis not present

## 2023-08-28 DIAGNOSIS — J849 Interstitial pulmonary disease, unspecified: Secondary | ICD-10-CM | POA: Diagnosis present

## 2023-08-28 DIAGNOSIS — Z91010 Allergy to peanuts: Secondary | ICD-10-CM

## 2023-08-28 DIAGNOSIS — K2970 Gastritis, unspecified, without bleeding: Secondary | ICD-10-CM | POA: Diagnosis not present

## 2023-08-28 DIAGNOSIS — Z7189 Other specified counseling: Secondary | ICD-10-CM | POA: Diagnosis not present

## 2023-08-28 DIAGNOSIS — Z888 Allergy status to other drugs, medicaments and biological substances status: Secondary | ICD-10-CM

## 2023-08-28 DIAGNOSIS — E869 Volume depletion, unspecified: Secondary | ICD-10-CM | POA: Diagnosis present

## 2023-08-28 DIAGNOSIS — N1832 Chronic kidney disease, stage 3b: Secondary | ICD-10-CM | POA: Diagnosis present

## 2023-08-28 DIAGNOSIS — D849 Immunodeficiency, unspecified: Secondary | ICD-10-CM | POA: Diagnosis present

## 2023-08-28 DIAGNOSIS — R1319 Other dysphagia: Secondary | ICD-10-CM | POA: Diagnosis not present

## 2023-08-28 DIAGNOSIS — Z9221 Personal history of antineoplastic chemotherapy: Secondary | ICD-10-CM

## 2023-08-28 DIAGNOSIS — E440 Moderate protein-calorie malnutrition: Secondary | ICD-10-CM | POA: Diagnosis present

## 2023-08-28 DIAGNOSIS — D5919 Other autoimmune hemolytic anemia: Secondary | ICD-10-CM | POA: Diagnosis present

## 2023-08-28 DIAGNOSIS — D696 Thrombocytopenia, unspecified: Secondary | ICD-10-CM | POA: Diagnosis not present

## 2023-08-28 DIAGNOSIS — E785 Hyperlipidemia, unspecified: Secondary | ICD-10-CM | POA: Diagnosis present

## 2023-08-28 DIAGNOSIS — Z8249 Family history of ischemic heart disease and other diseases of the circulatory system: Secondary | ICD-10-CM | POA: Diagnosis not present

## 2023-08-28 DIAGNOSIS — E883 Tumor lysis syndrome: Principal | ICD-10-CM | POA: Diagnosis present

## 2023-08-28 DIAGNOSIS — B181 Chronic viral hepatitis B without delta-agent: Secondary | ICD-10-CM | POA: Diagnosis present

## 2023-08-28 DIAGNOSIS — Z833 Family history of diabetes mellitus: Secondary | ICD-10-CM | POA: Diagnosis not present

## 2023-08-28 DIAGNOSIS — I959 Hypotension, unspecified: Secondary | ICD-10-CM | POA: Diagnosis not present

## 2023-08-28 DIAGNOSIS — R7309 Other abnormal glucose: Secondary | ICD-10-CM | POA: Diagnosis present

## 2023-08-28 DIAGNOSIS — B3781 Candidal esophagitis: Secondary | ICD-10-CM | POA: Diagnosis not present

## 2023-08-28 DIAGNOSIS — N1330 Unspecified hydronephrosis: Secondary | ICD-10-CM | POA: Diagnosis present

## 2023-08-28 DIAGNOSIS — C9110 Chronic lymphocytic leukemia of B-cell type not having achieved remission: Secondary | ICD-10-CM | POA: Diagnosis not present

## 2023-08-28 DIAGNOSIS — I129 Hypertensive chronic kidney disease with stage 1 through stage 4 chronic kidney disease, or unspecified chronic kidney disease: Secondary | ICD-10-CM | POA: Diagnosis present

## 2023-08-28 DIAGNOSIS — Z6828 Body mass index (BMI) 28.0-28.9, adult: Secondary | ICD-10-CM

## 2023-08-28 DIAGNOSIS — R54 Age-related physical debility: Secondary | ICD-10-CM | POA: Diagnosis present

## 2023-08-28 DIAGNOSIS — N184 Chronic kidney disease, stage 4 (severe): Secondary | ICD-10-CM | POA: Diagnosis present

## 2023-08-28 DIAGNOSIS — K2289 Other specified disease of esophagus: Secondary | ICD-10-CM | POA: Diagnosis present

## 2023-08-28 DIAGNOSIS — D591 Autoimmune hemolytic anemia, unspecified: Secondary | ICD-10-CM | POA: Diagnosis present

## 2023-08-28 DIAGNOSIS — M7989 Other specified soft tissue disorders: Secondary | ICD-10-CM | POA: Diagnosis not present

## 2023-08-28 DIAGNOSIS — R1013 Epigastric pain: Secondary | ICD-10-CM | POA: Diagnosis not present

## 2023-08-28 DIAGNOSIS — N178 Other acute kidney failure: Secondary | ICD-10-CM | POA: Diagnosis not present

## 2023-08-28 DIAGNOSIS — Z860100 Personal history of colon polyps, unspecified: Secondary | ICD-10-CM

## 2023-08-28 DIAGNOSIS — K449 Diaphragmatic hernia without obstruction or gangrene: Secondary | ICD-10-CM | POA: Diagnosis not present

## 2023-08-28 DIAGNOSIS — Z856 Personal history of leukemia: Secondary | ICD-10-CM

## 2023-08-28 DIAGNOSIS — Z515 Encounter for palliative care: Secondary | ICD-10-CM | POA: Diagnosis not present

## 2023-08-28 DIAGNOSIS — E872 Acidosis, unspecified: Secondary | ICD-10-CM | POA: Diagnosis present

## 2023-08-28 DIAGNOSIS — E875 Hyperkalemia: Secondary | ICD-10-CM | POA: Diagnosis present

## 2023-08-28 DIAGNOSIS — R112 Nausea with vomiting, unspecified: Secondary | ICD-10-CM | POA: Diagnosis not present

## 2023-08-28 DIAGNOSIS — K224 Dyskinesia of esophagus: Secondary | ICD-10-CM | POA: Diagnosis not present

## 2023-08-28 DIAGNOSIS — R1310 Dysphagia, unspecified: Secondary | ICD-10-CM

## 2023-08-28 DIAGNOSIS — N183 Chronic kidney disease, stage 3 unspecified: Secondary | ICD-10-CM | POA: Diagnosis not present

## 2023-08-28 DIAGNOSIS — I10 Essential (primary) hypertension: Secondary | ICD-10-CM | POA: Diagnosis present

## 2023-08-28 DIAGNOSIS — K2980 Duodenitis without bleeding: Secondary | ICD-10-CM | POA: Diagnosis not present

## 2023-08-28 LAB — CBC WITH DIFFERENTIAL/PLATELET
Abs Immature Granulocytes: 0 10*3/uL (ref 0.00–0.07)
Basophils Absolute: 0 10*3/uL (ref 0.0–0.1)
Basophils Relative: 0 %
Eosinophils Absolute: 0 10*3/uL (ref 0.0–0.5)
Eosinophils Relative: 0 %
HCT: 32.3 % — ABNORMAL LOW (ref 36.0–46.0)
Hemoglobin: 9.8 g/dL — ABNORMAL LOW (ref 12.0–15.0)
Lymphocytes Relative: 1 %
Lymphs Abs: 0.4 10*3/uL — ABNORMAL LOW (ref 0.7–4.0)
MCH: 27 pg (ref 26.0–34.0)
MCHC: 30.3 g/dL (ref 30.0–36.0)
MCV: 89 fL (ref 80.0–100.0)
Monocytes Absolute: 0.4 10*3/uL (ref 0.1–1.0)
Monocytes Relative: 1 %
Neutro Abs: 38.8 10*3/uL — ABNORMAL HIGH (ref 1.7–7.7)
Neutrophils Relative %: 98 %
Platelets: 197 10*3/uL (ref 150–400)
RBC: 3.63 MIL/uL — ABNORMAL LOW (ref 3.87–5.11)
RDW: 13.7 % (ref 11.5–15.5)
WBC: 39.6 10*3/uL — ABNORMAL HIGH (ref 4.0–10.5)
nRBC: 0 % (ref 0.0–0.2)

## 2023-08-28 LAB — TROPONIN I (HIGH SENSITIVITY)
Troponin I (High Sensitivity): 19 ng/L — ABNORMAL HIGH (ref <18)
Troponin I (High Sensitivity): 9 ng/L (ref <18)

## 2023-08-28 LAB — COMPREHENSIVE METABOLIC PANEL WITH GFR
ALT: 21 U/L (ref 0–44)
AST: 24 U/L (ref 15–41)
Albumin: 2.9 g/dL — ABNORMAL LOW (ref 3.5–5.0)
Alkaline Phosphatase: 123 U/L (ref 38–126)
Anion gap: 17 — ABNORMAL HIGH (ref 5–15)
BUN: 132 mg/dL — ABNORMAL HIGH (ref 8–23)
CO2: 15 mmol/L — ABNORMAL LOW (ref 22–32)
Calcium: 8.3 mg/dL — ABNORMAL LOW (ref 8.9–10.3)
Chloride: 104 mmol/L (ref 98–111)
Creatinine, Ser: 4.23 mg/dL — ABNORMAL HIGH (ref 0.44–1.00)
GFR, Estimated: 10 mL/min — ABNORMAL LOW (ref >60)
Glucose, Bld: 117 mg/dL — ABNORMAL HIGH (ref 70–99)
Potassium: 5.3 mmol/L — ABNORMAL HIGH (ref 3.5–5.1)
Sodium: 136 mmol/L (ref 135–145)
Total Bilirubin: 0.5 mg/dL (ref 0.0–1.2)
Total Protein: 6.1 g/dL — ABNORMAL LOW (ref 6.5–8.1)

## 2023-08-28 LAB — TYPE AND SCREEN
ABO/RH(D): B POS
Antibody Screen: NEGATIVE

## 2023-08-28 LAB — PHOSPHORUS: Phosphorus: 10.3 mg/dL — ABNORMAL HIGH (ref 2.5–4.6)

## 2023-08-28 LAB — URIC ACID: Uric Acid, Serum: 13 mg/dL — ABNORMAL HIGH (ref 2.5–7.1)

## 2023-08-28 LAB — I-STAT CG4 LACTIC ACID, ED: Lactic Acid, Venous: 3.4 mmol/L (ref 0.5–1.9)

## 2023-08-28 LAB — LIPASE, BLOOD: Lipase: 118 U/L — ABNORMAL HIGH (ref 11–51)

## 2023-08-28 LAB — MAGNESIUM: Magnesium: 3 mg/dL — ABNORMAL HIGH (ref 1.7–2.4)

## 2023-08-28 LAB — TSH: TSH: 2.193 u[IU]/mL (ref 0.350–4.500)

## 2023-08-28 LAB — LACTATE DEHYDROGENASE: LDH: 624 U/L — ABNORMAL HIGH (ref 98–192)

## 2023-08-28 LAB — PROCALCITONIN: Procalcitonin: 2.04 ng/mL

## 2023-08-28 MED ORDER — PANTOPRAZOLE SODIUM 40 MG PO TBEC: 40.0000 mg | DELAYED_RELEASE_TABLET | Freq: Every day | ORAL | Status: DC

## 2023-08-28 MED ORDER — POLYETHYLENE GLYCOL 3350 17 G PO PACK: 17.0000 g | PACK | Freq: Every day | ORAL | Status: DC | PRN

## 2023-08-28 MED ORDER — ONDANSETRON HCL 4 MG/2ML IJ SOLN
4.0000 mg | Freq: Four times a day (QID) | INTRAMUSCULAR | Status: DC | PRN
  Administered 2023-08-28: 4 mg via INTRAVENOUS
  Filled 2023-08-28: qty 2

## 2023-08-28 MED ORDER — METOCLOPRAMIDE HCL 5 MG/ML IJ SOLN
10.0000 mg | Freq: Once | INTRAMUSCULAR | Status: AC
  Administered 2023-08-28: 10 mg via INTRAVENOUS
  Filled 2023-08-28: qty 2

## 2023-08-28 MED ORDER — LACTATED RINGERS IV BOLUS
1000.0000 mL | Freq: Once | INTRAVENOUS | Status: AC
  Administered 2023-08-28: 1000 mL via INTRAVENOUS

## 2023-08-28 MED ORDER — SODIUM BICARBONATE 8.4 % IV SOLN
INTRAVENOUS | Status: AC
  Filled 2023-08-28 (×2): qty 150

## 2023-08-28 MED ORDER — SODIUM CHLORIDE 0.9 % IV SOLN
3.0000 mg | Freq: Once | INTRAVENOUS | Status: AC
  Administered 2023-08-28: 3 mg via INTRAVENOUS
  Filled 2023-08-28: qty 2

## 2023-08-28 MED ORDER — LACTATED RINGERS IV SOLN: INTRAVENOUS | Status: DC

## 2023-08-28 MED ORDER — SENNOSIDES-DOCUSATE SODIUM 8.6-50 MG PO TABS
2.0000 | ORAL_TABLET | Freq: Every day | ORAL | Status: DC
  Administered 2023-09-02 – 2023-09-03 (×2): 2 via ORAL
  Filled 2023-08-28 (×8): qty 2

## 2023-08-28 MED ORDER — CHLORHEXIDINE GLUCONATE CLOTH 2 % EX PADS
6.0000 | MEDICATED_PAD | Freq: Every day | CUTANEOUS | Status: DC
  Administered 2023-08-30 – 2023-09-02 (×4): 6 via TOPICAL

## 2023-08-28 MED ORDER — HEPARIN SODIUM (PORCINE) 5000 UNIT/ML IJ SOLN
5000.0000 [IU] | Freq: Three times a day (TID) | INTRAMUSCULAR | Status: DC
  Administered 2023-08-29 – 2023-09-02 (×10): 5000 [IU] via SUBCUTANEOUS
  Filled 2023-08-28 (×11): qty 1

## 2023-08-28 MED ORDER — ONDANSETRON HCL 4 MG/2ML IJ SOLN
4.0000 mg | Freq: Four times a day (QID) | INTRAMUSCULAR | Status: AC | PRN
  Administered 2023-08-29 – 2023-08-30 (×3): 4 mg via INTRAVENOUS
  Filled 2023-08-28 (×3): qty 2

## 2023-08-28 NOTE — H&P (Signed)
 History and Physical    Jacqueline Hancock:[id number] DOB: 12/10/1940 DOA: 08/28/2023  Patient coming from: Home.  Chief Complaint: Weakness.  HPI: Jacqueline Hancock is a 83 y.o. 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  Denies any nausea vomiting diarrhea fever chills chest pain or shortness of breath.  Has abdominal fullness.  Due to persistent weakness patient was brought to the ER by the patient's family.  ED Course: In the ER patient's labs show worsening renal function from 2.9 on 08/18/2023 it is around 4.2 with potassium of 5.3 bicarb 15 phosphorus of 10.3 calcium of 8.3 and uric acid of 13.  Lipase 118.  WBC count is 39.6 procalcitonin 2.04 lactic acid 3.4 troponin was 19 and 9.  EKG shows normal sinus rhythm.  ER physician discussed with on-call and patient's oncologist Dr. Salomon Cree who advised starting patient on rasburicase for possible tumor lysis syndrome and also ER physician discussed with Dr. Edson Graces who advised starting patient on bicarbonate infusion.  CT abdomen pelvis does not show anything acute but shows chronic changes.  Review of Systems: As per HPI, rest all negative.   Past Medical History:  Diagnosis Date   CLL (chronic lymphocytic leukemia) (HCC)    Dyspnea 2021   with exersion    Early cataracts, bilateral    Elevated hemoglobin A1c    Fibroids    GERD (gastroesophageal reflux disease)    Hyperlipidemia    Hypertension    Lymphoma (HCC)    Osteopenia     Past Surgical History:  Procedure Laterality Date   ABDOMINAL HYSTERECTOMY     APPENDECTOMY     CATARACT EXTRACTION, BILATERAL Bilateral 09/2017   Dr. Celina Colla   IR IMAGING GUIDED PORT INSERTION  08/21/2023   PAROTID GLAND TUMOR EXCISION  2012   benign   TONSILLECTOMY AND ADENOIDECTOMY       reports that she has never smoked. She has  never used smokeless tobacco. She reports that she does not drink alcohol and does not use drugs.  Allergies  Allergen Reactions   Nexium [Esomeprazole Magnesium] Other (See Comments)    Headache   Peanut Butter Flavoring Agent (Non-Screening) Itching    Family History  Problem Relation Age of Onset   Leukemia Mother    Hypertension Father    Diabetes Sister    Heart disease Brother    Diabetes Brother    Diabetes Brother    Hypertension Son    Heart disease Son     Prior to Admission medications   Medication Sig Start Date End Date Taking? Authorizing Provider  allopurinol  (ZYLOPRIM ) 100 MG tablet TAKE 1 TABLET(100 MG) BY MOUTH TWICE DAILY 08/21/23   Frankie Israel, MD  amLODipine  (NORVASC ) 5 MG tablet Take 1 tablet (5 mg total) by mouth daily. 06/14/23   Ngetich, Dinah C, NP  amoxicillin -clavulanate (AUGMENTIN ) 875-125 MG tablet Take 1 tablet by mouth every 12 (twelve) hours. 08/09/23   Kingsley, Victoria K, DO  azithromycin  (ZITHROMAX ) 250 MG tablet Take 1 tablet (250 mg total) by mouth daily. Starting tomorrow, take 1 every day until finished. 08/09/23   Kingsley, Victoria K, DO  B Complex Vitamins (VITAMIN B-COMPLEX PO) Take 1 tablet by mouth daily.    [provider]  Ferrous Sulfate  (IRON) 325 (65 Fe) MG TABS Take 325 mg by mouth  daily.     [provider]  furosemide  (LASIX ) 20 MG tablet TAKE 1 TABLET(20 MG) BY MOUTH DAILY 08/21/23   Frankie Israel, MD  lidocaine -prilocaine  (EMLA ) cream Apply to affected area once 08/18/23   Kale, Gautam Kishore, MD  Loratadine (CLARITIN PO) Take by mouth daily.    [provider]  nadolol  (CORGARD ) 20 MG tablet TAKE 1 TABLET BY MOUTH DAILY FOR BLOOD PRESSURE OR TREMORS Patient taking differently: Take 20 mg by mouth 2 (two) times daily. TAKE 1 TABLET BY MOUTH DAILY FOR BLOOD PRESSURE OR TREMORS 07/27/23   Ngetich, Dinah C, NP  nystatin  (MYCOSTATIN ) 100000 UNIT/ML suspension Take 5 mLs (500,000 Units total) by  mouth 4 (four) times daily. 08/09/23   Edmonia Gottron, PA-C  omeprazole  (PRILOSEC) 20 MG capsule Open capsule and sprinkle on food once daily 08/09/23   Collier, Amanda R, PA-C  ondansetron  (ZOFRAN ) 8 MG tablet Take 1 tablet (8 mg total) by mouth every 8 (eight) hours as needed for nausea or vomiting. Start on the third day after cyclophosphamide chemotherapy. 08/18/23   Kale, Gautam Kishore, MD  polyethylene glycol (MIRALAX  / GLYCOLAX ) 17 g packet Take 17 g by mouth daily as needed for mild constipation or moderate constipation. Also available OTC 06/18/23   Rai, Hurman Maiden, MD  predniSONE  (DELTASONE ) 20 MG tablet Take 3 tablets (60 mg total) by mouth daily with breakfast for 5 days, THEN 2 tablets (40 mg total) daily with breakfast for 5 days. 08/18/23 08/28/23  Frankie Israel, MD  predniSONE  (DELTASONE ) 20 MG tablet Take 3 tablets (60 mg total) by mouth daily. Take with food on days 2-5 of chemotherapy. 08/18/23   Frankie Israel, MD  prochlorperazine  (COMPAZINE ) 10 MG tablet Take 1 tablet (10 mg total) by mouth every 6 (six) hours as needed for nausea or vomiting. 08/18/23   Frankie Israel, MD  senna-docusate (SENOKOT-S) 8.6-50 MG tablet Take 2 tablets by mouth at bedtime. For constipation 06/18/23   Rai, Hurman Maiden, MD  traMADol  (ULTRAM ) 50 MG tablet Take 1 tablet (50 mg total) by mouth every 8 (eight) hours as needed for moderate pain (pain score 4-6) or severe pain (pain score 7-10) (pain). 06/18/23   Rai, Hurman Maiden, MD  gabapentin  (NEURONTIN ) 100 MG capsule Take 1 capsule 3 x /day for Neuropathy Pain Patient not taking: Reported on 01/25/2020 08/02/19 01/25/20  Vangie Genet, MD    Physical Exam: Constitutional: Moderately built and nourished. Vitals:   08/28/23 1427 08/28/23 1600 08/28/23 1850 08/28/23 2000  BP: (!) 120/57 (!) 111/52 (!) 106/49 (!) 110/53  Pulse: 66 62 69 70  Resp: 16 18 18    Temp: (!) 97.5 F (36.4 C)  (!) 97.4 F (36.3 C)   TempSrc: Oral  Oral   SpO2:  100% 98% 99% 98%   Eyes: Anicteric no pallor. ENMT: No discharge from the ears/nose or mouth. Neck: No mass felt.  No neck rigidity. Respiratory: No rhonchi or crepitations. Cardiovascular: S1-S2 heard. Abdomen: Soft nontender bowel sound present. Musculoskeletal: Bilateral lower extremity edema. Skin: No rash. Neurologic: Alert awake oriented to time place and person.  Moves all extremities. Psychiatric: Appears normal.  Normal affect.   Labs on Admission: I have personally reviewed following labs and imaging studies  CBC: Recent Labs  Lab 08/28/23 1518  WBC 39.6*  NEUTROABS 38.8*  HGB 9.8*  HCT 32.3*  MCV 89.0  PLT 197   Basic Metabolic Panel: Recent Labs  Lab 08/28/23 1518  NA 136  K 5.3*  CL 104  CO2 15*  GLUCOSE 117*  BUN 132*  CREATININE 4.23*  CALCIUM 8.3*  MG 3.0*  PHOS 10.3*   GFR: Estimated Creatinine Clearance: 8.2 mL/min (A) (by C-G formula based on SCr of 4.23 mg/dL (H)). Liver Function Tests: Recent Labs  Lab 08/28/23 1518  AST 24  ALT 21  ALKPHOS 123  BILITOT 0.5  PROT 6.1*  ALBUMIN 2.9*   Recent Labs  Lab 08/28/23 1518  LIPASE 118*   No results for input(s): "AMMONIA" in the last 168 hours. Coagulation Profile: No results for input(s): "INR", "PROTIME" in the last 168 hours. Cardiac Enzymes: No results for input(s): "CKTOTAL", "CKMB", "CKMBINDEX", "TROPONINI" in the last 168 hours. BNP (last 3 results) Recent Labs    02/21/23 1206  PROBNP 14.0   HbA1C: No results for input(s): "HGBA1C" in the last 72 hours. CBG: No results for input(s): "GLUCAP" in the last 168 hours. Lipid Profile: No results for input(s): "CHOL", "HDL", "LDLCALC", "TRIG", "CHOLHDL", "LDLDIRECT" in the last 72 hours. Thyroid  Function Tests: Recent Labs    08/28/23 1519  TSH 2.193   Anemia Panel: No results for input(s): "VITAMINB12", "FOLATE", "FERRITIN", "TIBC", "IRON", "RETICCTPCT" in the last 72 hours. Urine analysis:    Component Value  Date/Time   COLORURINE YELLOW 08/15/2023 1758   APPEARANCEUR CLOUDY (A) 08/15/2023 1758   LABSPEC 1.016 08/15/2023 1758   PHURINE 5.0 08/15/2023 1758   GLUCOSEU NEGATIVE 08/15/2023 1758   HGBUR NEGATIVE 08/15/2023 1758   BILIRUBINUR NEGATIVE 08/15/2023 1758   KETONESUR NEGATIVE 08/15/2023 1758   PROTEINUR 30 (A) 08/15/2023 1758   UROBILINOGEN 0.2 05/16/2014 1231   NITRITE NEGATIVE 08/15/2023 1758   LEUKOCYTESUR TRACE (A) 08/15/2023 1758   Sepsis Labs: @LABRCNTIP (procalcitonin:4,lacticidven:4) )No results found for this or any previous visit (from the past 240 hours).   Radiological Exams on Admission: CT ABDOMEN PELVIS WO CONTRAST Result Date: 08/28/2023 CLINICAL DATA:  Failure to thrive. 1st chemotherapy this week. Known lymphoma. * Tracking Code: BO * EXAM: CT ABDOMEN AND PELVIS WITHOUT CONTRAST TECHNIQUE: Multidetector CT imaging of the abdomen and pelvis was performed following the standard protocol without IV contrast. RADIATION DOSE REDUCTION: This exam was performed according to the departmental dose-optimization program which includes automated exposure control, adjustment of the mA and/or kV according to patient size and/or use of iterative reconstruction technique. COMPARISON:  08/09/2023 FINDINGS: Lower chest: Initial thickening at the lung bases likely due to subsegmental atelectasis and/or air trapping. Normal heart size without pericardial or pleural effusion. Hepatobiliary: Normal noncontrast appearance of the liver, gallbladder. No gross intrahepatic biliary duct dilatation. Pancreas: Normal, without mass or ductal dilatation. Spleen: Normal in size, without focal abnormality. Adrenals/Urinary Tract: Normal left adrenal gland. No left renal calculi. Mild left-sided caliectasis is unchanged. The dominant mass centered in the right abdominal retroperitoneum is amorphous, ill-defined and difficult to on this noncontrast exam. Estimated at 12.6 by 9.6 cm on 38/2, relatively similar to  08/09/2023. This is encompasses and obscures the right adrenal gland right kidney. Amorphous soft tissue extends into the left-sided abdominal retroperitoneum including on 43/2, relatively similar. Soft tissue thickening is most apparent about the posterior bladder wall with suggestion of soft tissue fullness about the right bladder base including on 73/2. This is less masslike than on the prior exam. Stomach/Bowel: Tiny hiatal hernia. No gastric outlet obstruction. Normal colon and terminal ileum. Normal small bowel caliber. Vascular/Lymphatic: Aortic atherosclerosis. Prominent but not pathologically sized abdominal retroperitoneal nodes. Reproductive: Hysterectomy.  No adnexal mass. Other:  Pelvic floor laxity. Small volume perihepatic ascites, decreased. Musculoskeletal: No acute osseous abnormality. IMPRESSION: 1. Limited exam secondary to lack of oral or IV contrast. 2. Grossly similar appearance of amorphous, ill-defined soft tissue density mass within the abdominopelvic right-sided retroperitoneum. Extension or adjacent presumed lymphoma within the left-sided abdominal retroperitoneum. 3. No gross bowel obstruction or other acute complication. 4. Similar mild left-sided caliectasis, likely due to left abdominal retroperitoneal lymphoma. 5. Soft tissue thickening about the posterior bladder wall with decreased soft tissue fullness about the right bladder base. Suspicious for lymphomatous involvement. 6. Decrease in small volume perihepatic ascites 7. Incidental findings, including: Aortic Atherosclerosis (ICD10-I70.0). Tiny hiatal hernia. Electronically Signed   By: Lore Rode M.D.   On: 08/28/2023 16:51   DG Chest Portable 1 View Result Date: 08/28/2023 CLINICAL DATA:  Weakness, lightheadedness, dizziness, history of lymphoma EXAM: PORTABLE CHEST 1 VIEW COMPARISON:  08/15/2023 FINDINGS: Single frontal view of the chest demonstrates right chest wall port via internal jugular approach, tip overlying  superior vena cava. Cardiac silhouette is unremarkable. No acute airspace disease, effusion, or pneumothorax. No acute bony abnormalities. IMPRESSION: 1. No acute intrathoracic process. Electronically Signed   By: Bobbye Burrow M.D.   On: 08/28/2023 15:31    EKG: Independently reviewed.  Normal sinus rhythm.  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 Salomon Cree and also on-call nephrologist Dr. Edson Graces and at this time rasburicase has been ordered for the elevated uric acid levels with sodium bicarb infusion started.  Will closely monitor intake output metabolic panel repeat labs including metabolic panel phosphorus uric acid levels.  Hold diuretics. Leukocytosis -   ER physician discussed with Dr. Salomon Cree oncologist who felt that the leukocytosis could be from patient receiving G-CSF after her recent chemo.  Patient afebrile.  Will check blood cultures.  Continue to monitor. Hypertension recently amlodipine  was discontinued due to low extremity edema.  Will closely monitor given the low normal blood pressure. Diastolic dysfunction per 2D echo done on 08/22/2023 which showed EF of 60 to 65% with grade 1 diastolic dysfunction.  Hold diuretics due to worsening renal function. Large B-cell lymphoma transformation and CLL being followed by oncologist had received recently mini CHOP regimen.  Dr. Salomon Cree patient's oncologist aware of the  admission. Anemia follow CBC.  Has had prior history of autoimmune hemolytic anemia.  Given that patient has acute renal failure on chronic disease with possible tumor lysis syndrome will need close monitoring further workup and more than 2 midnight stay.   DVT prophylaxis: Heparin . Code Status: Full code. Family Communication: Son and granddaughter. Disposition Plan: Stepdown. Consults called: Oncologist and nephrologist. Admission status: Inpatient.

## 2023-08-28 NOTE — ED Triage Notes (Signed)
 Pt has first chemo tx this week, since then been feeling very weak, lightheaded, dizzy. Sometimes spits up. Not eating

## 2023-08-28 NOTE — ED Provider Notes (Addendum)
 [place] EMERGENCY DEPARTMENT AT [facility] Provider Note   CSN: [id number] Arrival date & time: 08/28/23  1415     History  Chief Complaint  Patient presents with   Weakness    Jacqueline Hancock is a 83 y.o. female.   Weakness Associated symptoms: abdominal pain, dizziness, nausea and vomiting   Patient presents for generalized weakness.  Medical history includes HTN, HLD, GERD, osteopenia, CKD, lymphoma.  Her oncologist is Dr. Salomon Cree.  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  She underwent treatment with Imbruvica .  She was seen in the ED 2 weeks ago and had a right perinephric mass identified on imaging.  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  She was started on chemotherapy last week.  She underwent an infusion on Friday.  Up until then, she was feeling relatively well overall.  Family reports that she is typically active and able to eat.  Since her infusion 3 days ago, she has had severe fatigue, generalized weakness, poor appetite, nausea, vomiting.  She had a near syncopal episode earlier today.  Currently, she endorses some mild generalized abdominal pain.  She denies any other areas of discomfort.     Home Medications Prior to Admission medications   Medication Sig Start Date End Date Taking? Authorizing Provider  allopurinol  (ZYLOPRIM ) 100 MG tablet TAKE 1 TABLET(100 MG) BY MOUTH TWICE DAILY 08/21/23   Frankie Israel, MD  amLODipine  (NORVASC ) 5 MG tablet Take 1 tablet (5 mg total) by mouth daily. 06/14/23   Ngetich, Dinah C, NP  amoxicillin -clavulanate (AUGMENTIN ) 875-125 MG tablet Take 1 tablet by mouth every 12 (twelve) hours. 08/09/23   Kingsley, Victoria K, DO  azithromycin  (ZITHROMAX ) 250 MG tablet Take 1 tablet (250 mg total) by mouth daily. Starting tomorrow, take 1 every day until finished. 08/09/23   Kingsley, Victoria K, DO  B Complex Vitamins (VITAMIN B-COMPLEX PO) Take 1 tablet by mouth daily.     [provider]  Ferrous Sulfate  (IRON) 325 (65 Fe) MG TABS Take 325 mg by mouth daily.     [provider]  furosemide  (LASIX ) 20 MG tablet TAKE 1 TABLET(20 MG) BY MOUTH DAILY 08/21/23   Kale, Gautam Kishore, MD  lidocaine -prilocaine  (EMLA ) cream Apply to affected area once 08/18/23   Kale, Gautam Kishore, MD  Loratadine (CLARITIN PO) Take by mouth daily.    [provider]  nadolol  (CORGARD ) 20 MG tablet TAKE 1 TABLET BY MOUTH DAILY FOR BLOOD PRESSURE OR TREMORS Patient taking differently: Take 20 mg by mouth 2 (two) times daily. TAKE 1 TABLET BY MOUTH DAILY FOR BLOOD PRESSURE OR TREMORS 07/27/23   Ngetich, Dinah C, NP  nystatin  (MYCOSTATIN ) 100000 UNIT/ML suspension Take 5 mLs (500,000 Units total) by mouth 4 (four) times daily. 08/09/23   Edmonia Gottron, PA-C  omeprazole  (PRILOSEC) 20 MG capsule Open capsule and sprinkle on food once daily 08/09/23   Collier, Amanda R, PA-C  ondansetron  (ZOFRAN ) 8 MG tablet Take 1 tablet (8 mg total) by mouth every 8 (eight) hours as needed for nausea or vomiting. Start on the third day after cyclophosphamide chemotherapy. 08/18/23   Kale, Gautam Kishore, MD  polyethylene glycol (MIRALAX  / GLYCOLAX ) 17 g packet Take 17 g by mouth daily as needed for mild constipation or moderate constipation. Also available OTC 06/18/23   Rai, Ripudeep K, MD  predniSONE  (DELTASONE ) 20 MG tablet Take 3 tablets (60 mg total) by mouth  daily with breakfast for 5 days, THEN 2 tablets (40 mg total) daily with breakfast for 5 days. 08/18/23 08/28/23  Frankie Israel, MD  predniSONE  (DELTASONE ) 20 MG tablet Take 3 tablets (60 mg total) by mouth daily. Take with food on days 2-5 of chemotherapy. 08/18/23   Frankie Israel, MD  prochlorperazine  (COMPAZINE ) 10 MG tablet Take 1 tablet (10 mg total) by mouth every 6 (six) hours as needed for nausea or vomiting. 08/18/23   Frankie Israel, MD  senna-docusate (SENOKOT-S) 8.6-50 MG tablet Take 2 tablets by mouth at  bedtime. For constipation 06/18/23   Rai, Hurman Maiden, MD  traMADol  (ULTRAM ) 50 MG tablet Take 1 tablet (50 mg total) by mouth every 8 (eight) hours as needed for moderate pain (pain score 4-6) or severe pain (pain score 7-10) (pain). 06/18/23   Rai, Hurman Maiden, MD  gabapentin  (NEURONTIN ) 100 MG capsule Take 1 capsule 3 x /day for Neuropathy Pain Patient not taking: Reported on 01/25/2020 08/02/19 01/25/20  Vangie Genet, MD      Allergies    Nexium [esomeprazole magnesium] and Peanut butter flavoring agent (non-screening)    Review of Systems   Review of Systems  Constitutional:  Positive for activity change, appetite change and fatigue.  Gastrointestinal:  Positive for abdominal pain, nausea and vomiting.  Neurological:  Positive for dizziness, weakness (Generalized) and light-headedness.  All other systems reviewed and are negative.   Physical Exam Updated Vital Signs BP (!) 110/36   Pulse 70   Temp 97.9 F (36.6 C) (Oral)   Resp 18   Ht 4\' 11"  (1.499 m)   Wt 63.6 kg   SpO2 97%   BMI 28.32 kg/m  Physical Exam Vitals and nursing note reviewed.  Constitutional:      General: She is not in acute distress.    Appearance: She is well-developed. She is ill-appearing. She is not toxic-appearing or diaphoretic.  HENT:     Head: Normocephalic and atraumatic.     Right Ear: External ear normal.     Left Ear: External ear normal.     Nose: Nose normal.     Mouth/Throat:     Mouth: Mucous membranes are moist.  Eyes:     Extraocular Movements: Extraocular movements intact.     Conjunctiva/sclera: Conjunctivae normal.  Cardiovascular:     Rate and Rhythm: Normal rate and regular rhythm.     Heart sounds: No murmur heard. Pulmonary:     Effort: Pulmonary effort is normal. No respiratory distress.     Breath sounds: Normal breath sounds. No wheezing or rales.  Chest:     Chest wall: No tenderness.  Abdominal:     General: There is distension.     Palpations: Abdomen is soft.      Tenderness: There is abdominal tenderness. There is no guarding or rebound.  Musculoskeletal:        General: No swelling or deformity. Normal range of motion.     Cervical back: Normal range of motion and neck supple.  Skin:    General: Skin is warm and dry.     Coloration: Skin is not jaundiced or pale.  Neurological:     General: No focal deficit present.     Mental Status: She is alert and oriented to person, place, and time.     Motor: Weakness (Global) present.  Psychiatric:        Mood and Affect: Mood and affect normal.  Speech: Speech is delayed.        Behavior: Behavior is slowed. Behavior is cooperative.     ED Results / Procedures / Treatments   Labs (all labs ordered are listed, but only abnormal results are displayed) Labs Reviewed  COMPREHENSIVE METABOLIC PANEL WITH GFR - Abnormal; Notable for the following components:      Result Value   Potassium 5.3 (*)    CO2 15 (*)    Glucose, Bld 117 (*)    BUN 132 (*)    Creatinine, Ser 4.23 (*)    Calcium 8.3 (*)    Total Protein 6.1 (*)    Albumin 2.9 (*)    GFR, Estimated 10 (*)    Anion gap 17 (*)    All other components within normal limits  LIPASE, BLOOD - Abnormal; Notable for the following components:   Lipase 118 (*)    All other components within normal limits  CBC WITH DIFFERENTIAL/PLATELET - Abnormal; Notable for the following components:   WBC 39.6 (*)    RBC 3.63 (*)    Hemoglobin 9.8 (*)    HCT 32.3 (*)    Neutro Abs 38.8 (*)    Lymphs Abs 0.4 (*)    All other components within normal limits  MAGNESIUM - Abnormal; Notable for the following components:   Magnesium 3.0 (*)    All other components within normal limits  URIC ACID - Abnormal; Notable for the following components:   Uric Acid, Serum 13.0 (*)    All other components within normal limits  LACTATE DEHYDROGENASE - Abnormal; Notable for the following components:   LDH 624 (*)    All other components within normal limits   PHOSPHORUS - Abnormal; Notable for the following components:   Phosphorus 10.3 (*)    All other components within normal limits  I-STAT CG4 LACTIC ACID, ED - Abnormal; Notable for the following components:   Lactic Acid, Venous 3.4 (*)    All other components within normal limits  TROPONIN I (HIGH SENSITIVITY) - Abnormal; Notable for the following components:   Troponin I (High Sensitivity) 19 (*)    All other components within normal limits  MRSA NEXT GEN BY PCR, NASAL  TSH  PROCALCITONIN  URINALYSIS, ROUTINE W REFLEX MICROSCOPIC  RASBURICASE - URIC ACID  BASIC METABOLIC PANEL WITH GFR  HEPATIC FUNCTION PANEL  PHOSPHORUS  CBC WITH DIFFERENTIAL/PLATELET  I-STAT CG4 LACTIC ACID, ED  TYPE AND SCREEN  TROPONIN I (HIGH SENSITIVITY)    EKG None  Radiology CT ABDOMEN PELVIS WO CONTRAST Result Date: 08/28/2023 CLINICAL DATA:  Failure to thrive. 1st chemotherapy this week. Known lymphoma. * Tracking Code: BO * EXAM: CT ABDOMEN AND PELVIS WITHOUT CONTRAST TECHNIQUE: Multidetector CT imaging of the abdomen and pelvis was performed following the standard protocol without IV contrast. RADIATION DOSE REDUCTION: This exam was performed according to the departmental dose-optimization program which includes automated exposure control, adjustment of the mA and/or kV according to patient size and/or use of iterative reconstruction technique. COMPARISON:  08/09/2023 FINDINGS: Lower chest: Initial thickening at the lung bases likely due to subsegmental atelectasis and/or air trapping. Normal heart size without pericardial or pleural effusion. Hepatobiliary: Normal noncontrast appearance of the liver, gallbladder. No gross intrahepatic biliary duct dilatation. Pancreas: Normal, without mass or ductal dilatation. Spleen: Normal in size, without focal abnormality. Adrenals/Urinary Tract: Normal left adrenal gland. No left renal calculi. Mild left-sided caliectasis is unchanged. The dominant mass centered in  the right abdominal retroperitoneum is amorphous,  ill-defined and difficult to on this noncontrast exam. Estimated at 12.6 by 9.6 cm on 38/2, relatively similar to 08/09/2023. This is encompasses and obscures the right adrenal gland right kidney. Amorphous soft tissue extends into the left-sided abdominal retroperitoneum including on 43/2, relatively similar. Soft tissue thickening is most apparent about the posterior bladder wall with suggestion of soft tissue fullness about the right bladder base including on 73/2. This is less masslike than on the prior exam. Stomach/Bowel: Tiny hiatal hernia. No gastric outlet obstruction. Normal colon and terminal ileum. Normal small bowel caliber. Vascular/Lymphatic: Aortic atherosclerosis. Prominent but not pathologically sized abdominal retroperitoneal nodes. Reproductive: Hysterectomy.  No adnexal mass. Other: Pelvic floor laxity. Small volume perihepatic ascites, decreased. Musculoskeletal: No acute osseous abnormality. IMPRESSION: 1. Limited exam secondary to lack of oral or IV contrast. 2. Grossly similar appearance of amorphous, ill-defined soft tissue density mass within the abdominopelvic right-sided retroperitoneum. Extension or adjacent presumed lymphoma within the left-sided abdominal retroperitoneum. 3. No gross bowel obstruction or other acute complication. 4. Similar mild left-sided caliectasis, likely due to left abdominal retroperitoneal lymphoma. 5. Soft tissue thickening about the posterior bladder wall with decreased soft tissue fullness about the right bladder base. Suspicious for lymphomatous involvement. 6. Decrease in small volume perihepatic ascites 7. Incidental findings, including: Aortic Atherosclerosis (ICD10-I70.0). Tiny hiatal hernia. Electronically Signed   By: Lore Rode M.D.   On: 08/28/2023 16:51   DG Chest Portable 1 View Result Date: 08/28/2023 CLINICAL DATA:  Weakness, lightheadedness, dizziness, history of lymphoma EXAM: PORTABLE  CHEST 1 VIEW COMPARISON:  08/15/2023 FINDINGS: Single frontal view of the chest demonstrates right chest wall port via internal jugular approach, tip overlying superior vena cava. Cardiac silhouette is unremarkable. No acute airspace disease, effusion, or pneumothorax. No acute bony abnormalities. IMPRESSION: 1. No acute intrathoracic process. Electronically Signed   By: Bobbye Burrow M.D.   On: 08/28/2023 15:31    Procedures Procedures    Medications Ordered in ED Medications  sodium bicarbonate 150 mEq in dextrose 5 % 1,150 mL infusion ( Intravenous Other (enter comment in med admin window) 08/28/23 2059)  Chlorhexidine Gluconate Cloth 2 % PADS 6 each (has no administration in time range)  ondansetron  (ZOFRAN ) injection 4 mg (has no administration in time range)  polyethylene glycol (MIRALAX  / GLYCOLAX ) packet 17 g (has no administration in time range)  senna-docusate (Senokot-S) tablet 2 tablet (has no administration in time range)  heparin  injection 5,000 Units (has no administration in time range)  lactated ringers  bolus 1,000 mL (0 mLs Intravenous Stopped 08/28/23 1909)  metoCLOPramide (REGLAN) injection 10 mg (10 mg Intravenous Given 08/28/23 1831)  rasburicase (ELITEK) 3 mg in sodium chloride  0.9 % 48 mL IVPB (0 mg Intravenous Stopped 08/28/23 2058)    ED Course/ Medical Decision Making/ A&P                                 Medical Decision Making Amount and/or Complexity of Data Reviewed Labs: ordered. Radiology: ordered.  Risk Prescription drug management. Decision regarding hospitalization.   This patient presents to the ED for concern of generalized weakness, this involves an extensive number of treatment options, and is a complaint that carries with it a high risk of complications and morbidity.  The differential diagnosis includes side effect from chemotherapy, metabolic derangements, anemia, dehydration, infection   Co morbidities / Chronic conditions that complicate  the patient evaluation  HTN, HLD, GERD, osteopenia, CKD, lymphoma  Additional history obtained:  Additional history obtained from EMR External records from outside source obtained and reviewed including patient's family   Lab Tests:  I Ordered, and personally interpreted labs.  The pertinent results include: Kidney injury, hyperkalemia, leukocytosis, hyperuricemia, hypophosphatemia, elevation in LDH, all concerning for tumor lysis syndrome.  Anemia is baseline.  An anion gap metabolic acidosis present, likely due to azotemia.   Imaging Studies ordered:  I ordered imaging studies including x-ray, CT of abdomen and pelvis I independently visualized and interpreted imaging which showed redemonstration of known right-sided retroperitoneal mass, soft tissue thickening of posterior bladder, decreased ascites when compared to prior studies. I agree with the radiologist interpretation   Cardiac Monitoring: / EKG:  The patient was maintained on a cardiac monitor.  I personally viewed and interpreted the cardiac monitored which showed an underlying rhythm of: Sinus rhythm   Problem List / ED Course / Critical interventions / Medication management  Patient presenting for severe fatigue and generalized weakness.  Onset was after her chemotherapy session on Friday.  She was recently started on chemotherapy for her lymphoma.  On arrival in the ED, vital signs notable for widened pulse pressure.  On exam, patient does appear severely fatigued.  Her speech and movements are slowed.  She has global weakness.  She does not appear to have any focal deficits.  She is alert and oriented.  She is accompanied at bedside by family who provide most of the history.  Patient's abdomen seems mildly distended.  She does have a mild generalized tenderness.  Given her poor p.o. intake, IV fluids were ordered for hydration.  Patient declines any pain or nausea medication at this time.  Workup was initiated.   Patient's lab work is concerning for tumor lysis syndrome.  Her creatinine has nearly doubled.  She has hyperkalemia and hyperuricemia.  Leukocytosis is elevated at 39.6.  LDH is doubled since lab work 3 weeks ago.  I spoke with her oncologist, Dr. Salomon Cree, who will see in consult.  He states that she did get a WBC growth factor which is likely contributing to her leukocytosis.  He advised initiating rasburicase, 3 mg, and repeating uric acid level in 4 hours.  These were ordered.  I spoke with nephrologist who recommends continue medical management of her AKI, here at [facility], with initiation of sodium bicarbonate infusion.  This was ordered.  Patient was admitted for further management. I ordered medication including IV fluids for hydration; Reglan for nausea; rest.  Case for hyperuricemia; sodium bicarbonate for metabolic acidosis Reevaluation of the patient after these medicines showed that the patient improved I have reviewed the patients home medicines and have made adjustments as needed   Consultations Obtained:  I requested consultation with the oncologist, Dr. Salomon Cree,  and discussed lab and imaging findings as well as pertinent plan - they recommend: Continued hydration, initiate rasburicase, will see in consult I requested consultation with the nephrologist, Dr. Edson Graces,  and discussed lab and imaging findings as well as pertinent plan - they recommend: Admission here at [facility], initiation of sodium bicarbonate, will see in consult.   Social Determinants of Health:  Lives at home with family  CRITICAL CARE Performed by: Iva Mariner   Total critical care time: 35 minutes  Critical care time was exclusive of separately billable procedures and treating other patients.  Critical care was necessary to treat or prevent imminent or life-threatening deterioration.  Critical care was time spent personally by me on the following  activities: development of treatment plan with patient  and/or surrogate as well as nursing, discussions with consultants, evaluation of patient's response to treatment, examination of patient, obtaining history from patient or surrogate, ordering and performing treatments and interventions, ordering and review of laboratory studies, ordering and review of radiographic studies, pulse oximetry and re-evaluation of patient's condition.       Final Clinical Impression(s) / ED Diagnoses Final diagnoses:  Generalized weakness  AKI (acute kidney injury) (HCC)  Tumor lysis syndrome    Rx / DC Orders ED Discharge Orders     None         Iva Mariner, MD 08/28/23 2300    Iva Mariner, MD 08/28/23 2300

## 2023-08-29 ENCOUNTER — Encounter: Payer: Self-pay | Admitting: Hematology

## 2023-08-29 ENCOUNTER — Telehealth (HOSPITAL_COMMUNITY): Payer: Self-pay | Admitting: Pharmacy Technician

## 2023-08-29 ENCOUNTER — Other Ambulatory Visit (HOSPITAL_COMMUNITY): Payer: Self-pay

## 2023-08-29 DIAGNOSIS — R1310 Dysphagia, unspecified: Secondary | ICD-10-CM

## 2023-08-29 DIAGNOSIS — C8338 Diffuse large B-cell lymphoma, lymph nodes of multiple sites: Secondary | ICD-10-CM | POA: Diagnosis not present

## 2023-08-29 DIAGNOSIS — E440 Moderate protein-calorie malnutrition: Secondary | ICD-10-CM | POA: Insufficient documentation

## 2023-08-29 DIAGNOSIS — E883 Tumor lysis syndrome: Secondary | ICD-10-CM | POA: Diagnosis not present

## 2023-08-29 DIAGNOSIS — B181 Chronic viral hepatitis B without delta-agent: Secondary | ICD-10-CM | POA: Diagnosis not present

## 2023-08-29 DIAGNOSIS — R531 Weakness: Secondary | ICD-10-CM | POA: Diagnosis not present

## 2023-08-29 DIAGNOSIS — N179 Acute kidney failure, unspecified: Secondary | ICD-10-CM | POA: Diagnosis not present

## 2023-08-29 LAB — URINALYSIS, ROUTINE W REFLEX MICROSCOPIC
Bilirubin Urine: NEGATIVE
Glucose, UA: NEGATIVE mg/dL
Hgb urine dipstick: NEGATIVE
Ketones, ur: NEGATIVE mg/dL
Nitrite: NEGATIVE
Protein, ur: NEGATIVE mg/dL
Specific Gravity, Urine: 1.014 (ref 1.005–1.030)
pH: 5 (ref 5.0–8.0)

## 2023-08-29 LAB — BASIC METABOLIC PANEL WITH GFR
Anion gap: 14 (ref 5–15)
BUN: 126 mg/dL — ABNORMAL HIGH (ref 8–23)
CO2: 19 mmol/L — ABNORMAL LOW (ref 22–32)
Calcium: 7.1 mg/dL — ABNORMAL LOW (ref 8.9–10.3)
Chloride: 100 mmol/L (ref 98–111)
Creatinine, Ser: 4.11 mg/dL — ABNORMAL HIGH (ref 0.44–1.00)
GFR, Estimated: 10 mL/min — ABNORMAL LOW (ref >60)
Glucose, Bld: 126 mg/dL — ABNORMAL HIGH (ref 70–99)
Potassium: 4.6 mmol/L (ref 3.5–5.1)
Sodium: 133 mmol/L — ABNORMAL LOW (ref 135–145)

## 2023-08-29 LAB — MRSA NEXT GEN BY PCR, NASAL: MRSA by PCR Next Gen: DETECTED — AB

## 2023-08-29 LAB — CBC WITH DIFFERENTIAL/PLATELET
Abs Immature Granulocytes: 0 10*3/uL (ref 0.00–0.07)
Band Neutrophils: 3 %
Basophils Absolute: 0 10*3/uL (ref 0.0–0.1)
Basophils Relative: 0 %
Eosinophils Absolute: 0 10*3/uL (ref 0.0–0.5)
Eosinophils Relative: 0 %
HCT: 26.5 % — ABNORMAL LOW (ref 36.0–46.0)
Hemoglobin: 8.4 g/dL — ABNORMAL LOW (ref 12.0–15.0)
Lymphocytes Relative: 1 %
Lymphs Abs: 0.3 10*3/uL — ABNORMAL LOW (ref 0.7–4.0)
MCH: 27.8 pg (ref 26.0–34.0)
MCHC: 31.7 g/dL (ref 30.0–36.0)
MCV: 87.7 fL (ref 80.0–100.0)
Monocytes Absolute: 0.5 10*3/uL (ref 0.1–1.0)
Monocytes Relative: 2 %
Neutro Abs: 24.4 10*3/uL — ABNORMAL HIGH (ref 1.7–7.7)
Neutrophils Relative %: 94 %
Platelets: 163 10*3/uL (ref 150–400)
RBC: 3.02 MIL/uL — ABNORMAL LOW (ref 3.87–5.11)
RDW: 13.6 % (ref 11.5–15.5)
WBC: 25.2 10*3/uL — ABNORMAL HIGH (ref 4.0–10.5)
nRBC: 0 % (ref 0.0–0.2)

## 2023-08-29 LAB — HEPATIC FUNCTION PANEL
ALT: 17 U/L (ref 0–44)
AST: 21 U/L (ref 15–41)
Albumin: 2.3 g/dL — ABNORMAL LOW (ref 3.5–5.0)
Alkaline Phosphatase: 92 U/L (ref 38–126)
Bilirubin, Direct: 0.2 mg/dL (ref 0.0–0.2)
Indirect Bilirubin: 0.4 mg/dL (ref 0.3–0.9)
Total Bilirubin: 0.6 mg/dL (ref 0.0–1.2)
Total Protein: 5.1 g/dL — ABNORMAL LOW (ref 6.5–8.1)

## 2023-08-29 LAB — RASBURICASE - URIC ACID: Uric Acid, Serum: 10 mg/dL — ABNORMAL HIGH (ref 2.5–7.1)

## 2023-08-29 LAB — PHOSPHORUS: Phosphorus: 9.1 mg/dL — ABNORMAL HIGH (ref 2.5–4.6)

## 2023-08-29 LAB — SODIUM, URINE, RANDOM: Sodium, Ur: 13 mmol/L

## 2023-08-29 LAB — CREATININE, URINE, RANDOM: Creatinine, Urine: 69 mg/dL

## 2023-08-29 MED ORDER — MUPIROCIN 2 % EX OINT
1.0000 | TOPICAL_OINTMENT | Freq: Two times a day (BID) | CUTANEOUS | Status: AC
  Administered 2023-08-29 – 2023-09-02 (×10): 1 via NASAL
  Filled 2023-08-29: qty 22

## 2023-08-29 MED ORDER — MIDODRINE HCL 5 MG PO TABS
5.0000 mg | ORAL_TABLET | Freq: Three times a day (TID) | ORAL | Status: DC
  Administered 2023-08-29: 5 mg via ORAL
  Filled 2023-08-29: qty 1

## 2023-08-29 MED ORDER — MIDODRINE HCL 5 MG PO TABS
10.0000 mg | ORAL_TABLET | Freq: Three times a day (TID) | ORAL | Status: DC
  Administered 2023-08-29: 5 mg via ORAL
  Administered 2023-09-01 – 2023-09-05 (×13): 10 mg via ORAL
  Filled 2023-08-29 (×17): qty 2

## 2023-08-29 MED ORDER — SODIUM BICARBONATE 8.4 % IV SOLN
INTRAVENOUS | Status: DC
  Filled 2023-08-29: qty 1000

## 2023-08-29 MED ORDER — GERHARDT'S BUTT CREAM
TOPICAL_CREAM | Freq: Two times a day (BID) | CUTANEOUS | Status: DC
  Administered 2023-08-30 – 2023-09-01 (×3): 1 via TOPICAL
  Filled 2023-08-29: qty 60

## 2023-08-29 MED ORDER — SODIUM CHLORIDE 0.9 % IV BOLUS
250.0000 mL | Freq: Once | INTRAVENOUS | Status: AC
  Administered 2023-08-29: 250 mL via INTRAVENOUS

## 2023-08-29 MED ORDER — BOOST / RESOURCE BREEZE PO LIQD CUSTOM
1.0000 | Freq: Three times a day (TID) | ORAL | Status: DC
  Administered 2023-09-01 – 2023-09-03 (×3): 1 via ORAL

## 2023-08-29 MED ORDER — ENTECAVIR 0.5 MG PO TABS
0.5000 mg | ORAL_TABLET | ORAL | Status: DC
  Administered 2023-08-30: 0.5 mg via ORAL
  Filled 2023-08-29: qty 1

## 2023-08-29 MED ORDER — ORAL CARE MOUTH RINSE: 15.0000 mL | OROMUCOSAL | Status: DC | PRN

## 2023-08-29 MED ORDER — SODIUM BICARBONATE 8.4 % IV SOLN
INTRAVENOUS | Status: DC
  Filled 2023-08-29: qty 150

## 2023-08-29 NOTE — Progress Notes (Signed)
 Initial Nutrition Assessment  DOCUMENTATION CODES:   Non-severe (moderate) malnutrition in context of chronic illness  INTERVENTION:  - DYS 1 diet with fluid restriction per MD.   - SLP eval pending.  - Boost Breeze po TID, each supplement provides 250 kcal and 9 grams of protein - Magic cup TID with meals, each supplement provides 290 kcal and 9 grams of protein - Monitor weight trends.   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 Admitted for acute renal failure.   Patient resting in bed at time of visit. Son and niece at bedside provided nutrition history.   Per EMR, weight mostly stable between 155-160# the past year until the past ~2 months. Patient weighed at 160# at the end of March and dropped to 141# by the end of April. This is a 19# or 12% weight loss in 1 months, which is significant for the time frame. Weight appears stable over the past month.  Niece reports the patient has had almost nothing to eat since the beginning of this month. They note she started having swallowing issues which has hindered intake along with poor appetite. She has tried Ensure in the past but didn't like it.   Patient on a renal diet this AM but discussed with family and MD about getting SLP eval to assess for safest diet.  Patient now on a DYS 1 diet, however SLP note indicates plan for SLP eval tomorrow.  Will follow for further diet plans. Patient should get Magic Cup with DYS 1 diet. Will also trial Boost Breeze for another option.    Medications reviewed and include: Senokot  Labs reviewed: Na 133 Creatinine 4.11 Phosphorus 9.1 HA1C 5.7   NUTRITION - FOCUSED PHYSICAL EXAM:  Flowsheet Row Most Recent Value  Orbital Region Mild depletion  Upper Arm Region No depletion  Thoracic and Lumbar Region No depletion  Buccal Region No depletion  Temple Region Moderate depletion  Clavicle Bone Region Mild depletion  Clavicle and Acromion Bone Region Mild depletion  Scapular Bone Region Unable to assess  Dorsal Hand No depletion  Patellar Region No depletion  Anterior Thigh Region No depletion  Posterior Calf Region No depletion  Edema (RD Assessment) Mild  Hair Reviewed  Eyes Reviewed  Mouth Reviewed  [smooth tongue,  missing teeth]  Skin Reviewed  Nails Reviewed       Diet Order:   Diet Order             DIET - DYS 1 Room service appropriate? Yes; Fluid consistency: Thin; Fluid restriction: 1200 mL Fluid  Diet effective now                   EDUCATION NEEDS:  Education needs have been addressed  Skin:  Skin Assessment: Reviewed RN Assessment  Last BM:  5/25  Height:  Ht Readings from Last 1 Encounters:  08/28/23 4\' 11"  (1.499 m)   Weight:  Wt Readings from Last 1 Encounters:  08/28/23 63.6 kg   Ideal Body Weight:     BMI:  Body mass index is 28.32 kg/m.  Estimated Nutritional Needs:  Kcal:    Protein:    Fluid:       Scheryl Cushing RD, LDN Contact via Secure Chat.

## 2023-08-29 NOTE — Consult Note (Signed)
 Renal Service Consult Note [facility]  Jacqueline Hancock 08/29/2023 Jacqueline Sandifer, Hancock Requesting Physician: Jacqueline Hancock  Reason for Consult: Renal failure HPI: The patient is a 83 y.o. 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  She was started on chemotherapy 5 days ago and subsequently began feeling weak with poor appetite over the last few days.  No nausea, vomiting, diarrhea, fever, chills or shortness of breath.  Positive abdominal fullness.  She was brought to the ED by her family.  In ED creatinine was up to 4.2 (creatinine 2.9 on 08/18/2023).  K+ 5.3, bicarb 15, phosphorus 10, calcium 8.3 and uric acid 13.  Lactic acid 3.4, WBC 39 K.  The ED team gave rasburicase on advice of patient's oncologist for possible tumor lysis syndrome.  IV bicarbonate infusion was started as well.  CT abdomen pelvis just showed chronic changes.  Patient was admitted.  We are asked to see for renal failure  Pt seen in room with family at bedside.  Patient is minimally interactive and very fatigued and did not provide any history.  The family said that the patient has been having abdominal swelling and bilateral leg swelling for maybe 2 to 3 months.   Per the chart the patient has been followed for years for CLL but showed up in the emergency room in early May with a right upper quadrant abdominal mass which turned out to be a perinephric mass.  On imaging there was discrete right-sided hydronephrosis as well.  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  Typically aggressive lymphomas are treated with multiple chemotherapies and immunotherapy, but treatment considerations would also be dependent on her functional status.  Given her multiple comorbidities she may not tolerate standard regimen of chemotherapy.  Hospice care was discussed patient was not inclined  to consider hospice at that time.  R-mini CHOP combination chemotherapy was discussed.  Also other nonaggressive treatments were discussed.  Patient was inclined to proceed with R-mini CHOP.   ROS - denies CP, no joint pain, no HA, no blurry vision, no rash, no diarrhea, no nausea/ vomiting  PMH: Chronic lymphocytic leukemia Fibroids GERD Hyperlipidemia Hypertension Osteopenia  Past Surgical History  Past Surgical History:  Procedure Laterality Date   ABDOMINAL HYSTERECTOMY     APPENDECTOMY     CATARACT EXTRACTION, BILATERAL Bilateral 09/2017   Dr. Celina Hancock   IR IMAGING GUIDED PORT INSERTION  08/21/2023   PAROTID GLAND TUMOR EXCISION  2012   benign   TONSILLECTOMY AND ADENOIDECTOMY     Family History  Family History  Problem Relation Age of Onset   Leukemia Mother    Hypertension Father    Diabetes Sister    Heart disease Brother    Diabetes Brother    Diabetes Brother    Hypertension Son    Heart disease Son    Social History  reports that she has never smoked. She has never used smokeless tobacco. She reports that she does not drink alcohol and does not use drugs. Allergies  Allergies  Allergen Reactions   Nexium [Esomeprazole Magnesium] Other (See Comments)    Headache   Peanut Butter Flavoring Agent (Non-Screening) Itching   Home medications Prior to Admission medications   Medication Sig Start Date End Date Taking? Authorizing Provider  allopurinol  (ZYLOPRIM ) 100 MG tablet TAKE 1 TABLET(100 MG) BY MOUTH  TWICE DAILY 08/21/23  Yes Jacqueline Israel, Hancock  amLODipine  (NORVASC ) 5 MG tablet Take 1 tablet (5 mg total) by mouth daily. 06/14/23  Yes Jacqueline Hancock  B Complex Vitamins (VITAMIN B-COMPLEX PO) Take 1 tablet by mouth daily.   Yes Provider, Historical, Hancock  furosemide  (LASIX ) 20 MG tablet TAKE 1 TABLET(20 MG) BY MOUTH DAILY Patient taking differently: Take 20 mg by mouth daily. 08/21/23  Yes Jacqueline Israel, Hancock  loratadine (CLARITIN) 10 MG tablet  Take 10 mg by mouth daily.   Yes Provider, Historical, Hancock  nadolol  (CORGARD ) 20 MG tablet TAKE 1 TABLET BY MOUTH DAILY FOR BLOOD PRESSURE OR TREMORS Patient taking differently: Take 20 mg by mouth daily. 07/27/23  Yes Jacqueline Hancock  omeprazole  (PRILOSEC) 20 MG capsule Open capsule and sprinkle on food once daily Patient taking differently: Take 20 mg by mouth daily. 08/09/23  Yes Jacqueline Hancock  ondansetron  (ZOFRAN ) 8 MG tablet Take 1 tablet (8 mg total) by mouth every 8 (eight) hours as needed for nausea or vomiting. Start on the third day after cyclophosphamide chemotherapy. Patient taking differently: Take 8 mg by mouth daily as needed for nausea or vomiting. 08/18/23  Yes Jacqueline Hancock  polyethylene glycol (MIRALAX  / GLYCOLAX ) 17 g packet Take 17 g by mouth daily as needed for mild constipation or moderate constipation. Also available OTC 06/18/23  Yes Jacqueline Hancock  prochlorperazine  (COMPAZINE ) 10 MG tablet Take 1 tablet (10 mg total) by mouth every 6 (six) hours as needed for nausea or vomiting. 08/18/23  Yes Jacqueline Israel, Hancock  senna-docusate (SENOKOT-S) 8.6-50 MG tablet Take 2 tablets by mouth at bedtime. For constipation 06/18/23  Yes Jacqueline Hancock  torsemide  (DEMADEX ) 20 MG tablet Take 20 mg by mouth daily as needed (fluid). 08/17/23  Yes Provider, Historical, Hancock  traMADol  (ULTRAM ) 50 MG tablet Take 1 tablet (50 mg total) by mouth every 8 (eight) hours as needed for moderate pain (pain score 4-6) or severe pain (pain score 7-10) (pain). Patient taking differently: Take 50 mg by mouth daily as needed for moderate pain (pain score 4-6) or severe pain (pain score 7-10) (pain). 06/18/23  Yes Jacqueline Hancock  amoxicillin -clavulanate (AUGMENTIN ) 875-125 MG tablet Take 1 tablet by mouth every 12 (twelve) hours. Patient not taking: Reported on 08/29/2023 08/09/23   Jacqueline Hancock  azithromycin  (ZITHROMAX ) 250 MG tablet Take 1 tablet (250 mg total) by  mouth daily. Starting tomorrow, take 1 every day until finished. Patient not taking: Reported on 08/29/2023 08/09/23   Jacqueline Hancock  lidocaine -prilocaine  (EMLA ) cream Apply to affected area once Patient not taking: Reported on 08/29/2023 08/18/23   Jacqueline Israel, Hancock  losartan  (COZAAR ) 50 MG tablet Take 50 mg by mouth daily. Patient not taking: Reported on 08/29/2023    Provider, Historical, Hancock  nystatin  (MYCOSTATIN ) 100000 UNIT/ML suspension Take 5 mLs (500,000 Units total) by mouth 4 (four) times daily. Patient not taking: Reported on 08/29/2023 08/09/23   Edmonia Gottron, Hancock  gabapentin  (NEURONTIN ) 100 MG capsule Take 1 capsule 3 x /day for Neuropathy Pain Patient not taking: Reported on 01/25/2020 08/02/19 01/25/20  Vangie Genet, Hancock     Vitals:   08/29/23 1330 08/29/23 1400 08/29/23 1430 08/29/23 1500  BP: (!) 115/43 (!) 114/50 (!) 112/46 (!) 104/42  Pulse: 71 70 69 71  Resp: 18 15 16 18   Temp:      TempSrc:  SpO2: 95% 97% 95% 96%  Weight:      Height:       Exam Gen elderly, frail female, significantly fatigued and unable to interact verbally; muscle wasting in the face and elsewhere No rash, cyanosis or gangrene Sclera anicteric, throat clear  No jvd or bruits Chest clear bilat to bases, no rales/ wheezing RRR no MRG Abd soft protuberant, non-tender GU defer MS no joint effusions or deformity Ext diffuse 1-2+ bilat LE edema, no other edema Neuro is lethargic as above      Renal-related home meds: Demadex  20 mg daily as needed Losartan  50 mg daily Norvasc  5 mg daily Nadolol  20 mg daily Others: Ultram , Compazine , Zofran , Prilosec, vitamins, allopurinol   Date   Creat  eGFR (ml/min) 2012- 2016  0.98- 1.06 2017- 2020  1.06- 1.35  2021- 2022  1.07- 1.35 2023   1.11-  1.43 2024   1.19- 1.58 Mar 2025  1.27- 1.61 April 2025  1.56- 2.42 5/01- 08/09/23  2.03- 2.34 20-24 ml/min 08/15/23  2.51 08/18/22  2.91 5/26   4.23 08/29/23  4.11  UA 5/26 -  prot neg, 0-5 rbc, 11-20 wbc CT 08/28/23 - showed she dominant mass centered in the right abdominal retroperitoneum is amorphous, ill-defined and difficult to on this noncontrast exam.. estimated at 12.6 by 9.6 cm on 38/2, relatively similar to 08/09/2023... this mass encompasses and obscures the right adrenal gland and the right kidney. Amorphous soft tissue extends into the left-sided abdominal retroperitoneum.   Assessment/ Plan: AKI on CKD 4: b/l creatinine 2.0- 2.3 from early May 2025, eGFR 20-25 ml/min.  Creat here was 4.2 on admission in the setting of recent 1st dose chemotherapy, high uric acid and suspected TLS. Pt was rx'd w/ rasburicase and IVF's. UOP yesterday was zero, 100 cc recorded so far today. Foley cath has been in place. BP's have been soft. CT imaging shows R sided soft-tissue mass which encompasses and obscures the view of the R adrenal and the R kidney. 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 AKI may be due to hypotension, worsening R kidney function due to obstruction from R abd mass, and/or possible tumor lysis syndrome. Will order urine lytes and cont IVF"s for now, and ^midodrine to 10mg  tid. Watch for signs of vol overload. Will follow.  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 No resp issues. 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 Plan is R-mini CHOP therapy, 1st dose was 5/21. Per oncology.  CLL: dx'd in 2021.       Larry Poag  Hancock CKA 08/29/2023, 3:22 PM  Recent Labs  Lab 08/28/23 1518 08/29/23 0424  HGB 9.8* 8.4*  ALBUMIN 2.9* 2.3*  CALCIUM 8.3* 7.1*  PHOS 10.3* 9.1*  CREATININE 4.23* 4.11*  K 5.3* 4.6   Inpatient medications:  Chlorhexidine Gluconate Cloth  6 each Topical Daily   Gerhardt's butt cream   Topical BID   heparin   5,000 Units Subcutaneous Q8H   midodrine  5 mg Oral TID WC    mupirocin ointment  1 Application Nasal BID   senna-docusate  2 tablet Oral QHS    sodium bicarbonate 150 mEq in dextrose 5 % 1,150 mL infusion 75 mL/hr at 08/29/23 1200   ondansetron  (ZOFRAN ) IV, mouth rinse, polyethylene glycol

## 2023-08-29 NOTE — Plan of Care (Signed)
  Problem: Education: Goal: Knowledge of General Education information will improve Description: Including pain rating scale, medication(s)/side effects and non-pharmacologic comfort measures Outcome: Progressing   Problem: Nutrition: Goal: Adequate nutrition will be maintained Outcome: Not Progressing   Problem: Pain Managment: Goal: General experience of comfort will improve and/or be controlled Outcome: Progressing

## 2023-08-29 NOTE — Progress Notes (Cosign Needed)
 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  She is currently on active treatment with R-miniCHOP, status post cycle 1 on 08/23/23.  She was admitted 08/28/2023 due to complaints of generalized weakness, dysphagia, nausea vomiting and failure to thrive.  CLL/SLL  CD20+ CD5+ve lymphoproliferative disorder  Leukocytosis - Diagnosed 2021 when she presented with hemolytic anemia and splenomegaly. - Status post Imbruvica  started in 2022. - Patient seen in outpatient oncology office on 08/16/2023: Treatment options discussed vs hospice care.  Patient opted for R-mini CHOP received 5/21. - Cycle 2-day 1 due 6/11, on hold at this time. - Medical oncology/Dr. 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  Family members at bedside.  Reports ongoing nausea and poor appetite x 2 to 3 weeks.  Continues to feel very weak.  Objective:   Intake/Output Summary (Last 24 hours) at 08/29/2023 1136 Last data filed at 08/29/2023 0800 Gross per 24 hour  Intake 2435.41 ml  Output 100 ml  Net 2335.41 ml     PHYSICAL EXAMINATION: ECOG PERFORMANCE STATUS: 4 - Bedbound  Vitals:   08/29/23 1000 08/29/23 1030  BP: (!) 103/39 (!) 109/41  Pulse: 73 72  Resp: (!) 24 (!) 24  Temp:    SpO2: 95% 96%    Filed Weights   08/28/23 2142  Weight: 140 lb 3.4 oz (63.6 kg)    GENERAL: alert, + chronically ill-appearing SKIN: skin color, texture, turgor are normal, no rashes or significant lesions EYES: normal, conjunctiva are pink and non-injected, sclera clear OROPHARYNX: no exudate, no erythema and lips, buccal mucosa, and tongue normal  NECK: supple, thyroid  normal size, non-tender, without nodularity LYMPH: no palpable lymphadenopathy in the cervical, axillary or inguinal LUNGS: clear to auscultation and percussion with normal breathing effort HEART: regular rate & rhythm and no murmurs and no lower extremity edema ABDOMEN: + Abdominal distention MUSCULOSKELETAL: no cyanosis of digits and no clubbing  PSYCH: alert & oriented x 3 with fluent speech NEURO: no focal motor/sensory deficits   All questions were answered. The patient knows to call the clinic with any problems, questions or concerns.   The total time spent in the appointment was 40 minutes encounter with patient including review of chart and various tests results, discussions about plan of care and coordination of care plan  Jacqueline Mate, NP 08/29/2023 11:36 AM    Labs Reviewed:  Lab Results  Component Value Date   WBC 25.2 (H) 08/29/2023   HGB 8.4 (L) 08/29/2023   HCT 26.5 (L) 08/29/2023   MCV 87.7 08/29/2023   PLT 163 08/29/2023   Recent Labs    08/18/23 1419 08/28/23 1518 08/29/23 0424  NA 136 136 133*  K 4.6 5.3* 4.6  CL 108 104 100  CO2 18* 15* 19*  GLUCOSE 92 117* 126*  BUN 43* 132* 126*  CREATININE 2.91* 4.23* 4.11*  CALCIUM 10.2 8.3* 7.1*  GFRNONAA 16* 10* 10*  PROT 6.6 6.1* 5.1*  ALBUMIN  3.6 2.9* 2.3*  AST 20 24 21   ALT 12 21 17   ALKPHOS 58 123 92  BILITOT 0.7 0.5 0.6  BILIDIR  --   --  0.2  IBILI  --   --  0.4    Studies Reviewed:  CT ABDOMEN PELVIS WO CONTRAST Result Date: 08/28/2023 CLINICAL DATA:  Failure to thrive. 1st chemotherapy this week. Known lymphoma. * Tracking Code: BO *  EXAM: CT ABDOMEN AND PELVIS WITHOUT CONTRAST TECHNIQUE: Multidetector CT imaging of the abdomen and pelvis was performed following the standard protocol without IV contrast. RADIATION DOSE REDUCTION: This exam was performed according to the departmental dose-optimization program which includes automated exposure control, adjustment of the mA and/or kV according to patient size and/or use of iterative reconstruction technique. COMPARISON:  08/09/2023 FINDINGS: Lower chest: Initial thickening at the lung bases likely due to subsegmental atelectasis and/or air trapping. Normal heart size without pericardial or pleural effusion. Hepatobiliary: Normal noncontrast appearance of the liver, gallbladder. No gross intrahepatic biliary duct dilatation. Pancreas: Normal, without mass or ductal dilatation. Spleen: Normal in size, without focal abnormality. Adrenals/Urinary Tract: Normal left adrenal gland. No left renal calculi. Mild left-sided caliectasis is unchanged. The dominant mass centered in the right abdominal retroperitoneum is amorphous, ill-defined and difficult to on this noncontrast exam. Estimated at 12.6 by 9.6 cm on 38/2, relatively similar to 08/09/2023. This is encompasses and obscures the right adrenal gland right kidney. Amorphous soft tissue extends into the left-sided abdominal retroperitoneum including on 43/2, relatively similar. Soft tissue thickening is most apparent about the posterior bladder wall with suggestion of soft tissue fullness about the right bladder base including on 73/2. This is less masslike than on the prior exam. Stomach/Bowel: Tiny hiatal hernia. No gastric outlet obstruction. Normal colon and terminal ileum. Normal small bowel caliber. Vascular/Lymphatic: Aortic atherosclerosis. Prominent but not pathologically sized abdominal retroperitoneal nodes. Reproductive: Hysterectomy.  No adnexal mass. Other: Pelvic floor laxity. Small volume perihepatic ascites, decreased. Musculoskeletal:  No acute osseous abnormality. IMPRESSION: 1. Limited exam secondary to lack of oral or IV contrast. 2. Grossly similar appearance of amorphous, ill-defined soft tissue density mass within the abdominopelvic right-sided retroperitoneum. Extension or adjacent presumed lymphoma within the left-sided abdominal retroperitoneum. 3. No gross bowel obstruction or other acute complication. 4. Similar mild left-sided caliectasis, likely due to left abdominal retroperitoneal lymphoma. 5. Soft tissue thickening about the posterior bladder wall with decreased soft tissue fullness about the right bladder base. Suspicious for lymphomatous involvement. 6. Decrease in small volume perihepatic ascites 7. Incidental findings, including: Aortic Atherosclerosis (ICD10-I70.0). Tiny hiatal hernia. Electronically Signed   By: Lore Rode M.D.   On: 08/28/2023 16:51   DG Chest Portable 1 View Result Date: 08/28/2023 CLINICAL DATA:  Weakness, lightheadedness, dizziness, history of lymphoma EXAM: PORTABLE CHEST 1 VIEW COMPARISON:  08/15/2023 FINDINGS: Single frontal view of the chest demonstrates right chest wall port via internal jugular approach, tip overlying superior vena cava. Cardiac silhouette is unremarkable. No acute airspace disease, effusion, or pneumothorax. No acute bony abnormalities. IMPRESSION: 1. No acute intrathoracic process. Electronically Signed   By: Bobbye Burrow M.D.   On: 08/28/2023 15:31   ECHOCARDIOGRAM COMPLETE Result Date: 08/22/2023    ECHOCARDIOGRAM REPORT   Patient Name:   SCOTLYN MCCRANIE Date of Exam: 08/22/2023 Medical Rec #:  [id number]   Height:  59.0 in Accession #:    [id number]  Weight:       140.0 lb Date of Birth:  Jun 01, 1940   BSA:          1.585 m Patient Age:    83 years    BP:           114/63 mmHg Patient Gender: F           HR:           78 bpm. Exam Location:  Inpatient Procedure: 2D Echo, Color Doppler and Cardiac Doppler (Both Spectral and Color            Flow Doppler were utilized  during procedure). Indications:    Chemo  History:        Patient has prior history of Echocardiogram examinations, most                 recent 03/22/2023.  Sonographer:    Andrena Bang Referring Phys: [id number] Frankie Israel  Sonographer Comments: Known apical displaced papillary muscle in LV. IMPRESSIONS  1. Left ventricular ejection fraction, by estimation, is 60 to 65%. The left ventricle has normal function. The left ventricle has no regional wall motion abnormalities. Left ventricular diastolic parameters are consistent with Grade I diastolic dysfunction (impaired relaxation). The average left ventricular global longitudinal strain is -17.5 %. The global longitudinal strain is normal.  2. Right ventricular systolic function is normal. The right ventricular size is normal.  3. The mitral valve is normal in structure. Mild to moderate mitral valve regurgitation. No evidence of mitral stenosis.  4. The aortic valve is grossly normal. Aortic valve regurgitation is trivial. No aortic stenosis is present.  5. The inferior vena cava is normal in size with greater than 50% respiratory variability, suggesting right atrial pressure of 3 mmHg. Comparison(s): No significant change from prior study. Prior images reviewed side by side. FINDINGS  Left Ventricle: Left ventricular ejection fraction, by estimation, is 60 to 65%. The left ventricle has normal function. The left ventricle has no regional wall motion abnormalities. The average left ventricular global longitudinal strain is -17.5 %. Strain was performed and the global longitudinal strain is normal. The left ventricular internal cavity size was normal in size. There is no left ventricular hypertrophy. Left ventricular diastolic parameters are consistent with Grade I diastolic dysfunction (impaired relaxation). Normal left ventricular filling pressure. Right Ventricle: The right ventricular size is normal. No increase in right ventricular wall thickness. Right  ventricular systolic function is normal. Left Atrium: Left atrial size was normal in size. Right Atrium: Right atrial size was normal in size. Pericardium: There is no evidence of pericardial effusion. Mitral Valve: The mitral valve is normal in structure. Mild to moderate mitral valve regurgitation, with centrally-directed jet. No evidence of mitral valve stenosis. Tricuspid Valve: The tricuspid valve is normal in structure. Tricuspid valve regurgitation is mild . No evidence of tricuspid stenosis. Aortic Valve: The aortic valve is grossly normal. Aortic valve regurgitation is trivial. No aortic stenosis is present. Aortic valve mean gradient measures 4.0 mmHg. Aortic valve peak gradient measures 10.9 mmHg. Aortic valve area, by VTI measures 1.86 cm. Pulmonic Valve: The pulmonic valve was normal in structure. Pulmonic valve regurgitation is not visualized. No evidence of pulmonic stenosis. Aorta: The aortic root is normal in size and structure. Venous: The inferior vena cava is normal in size with greater than 50% respiratory variability, suggesting right atrial pressure of 3 mmHg. IAS/Shunts: No atrial level shunt  detected by color flow Doppler.  LEFT VENTRICLE PLAX 2D LVIDd:         3.90 cm     Diastology LVIDs:         2.20 cm     LV e' medial:    8.49 cm/s LV PW:         1.00 cm     LV E/e' medial:  7.9 LV IVS:        0.90 cm     LV e' lateral:   10.60 cm/s LVOT diam:     1.70 cm     LV E/e' lateral: 6.3 LV SV:         46 LV SV Index:   29          2D Longitudinal Strain LVOT Area:     2.27 cm    2D Strain GLS Avg:     -17.5 %  LV Volumes (MOD) LV vol d, MOD A2C: 49.6 ml LV vol d, MOD A4C: 54.7 ml LV vol s, MOD A2C: 16.9 ml LV vol s, MOD A4C: 24.9 ml LV SV MOD A2C:     32.7 ml LV SV MOD A4C:     54.7 ml LV SV MOD BP:      32.7 ml RIGHT VENTRICLE RV S prime:     16.90 cm/s TAPSE (M-mode): 2.0 cm LEFT ATRIUM             Index LA diam:        3.10 cm 1.96 cm/m LA Vol (A2C):   22.3 ml 14.07 ml/m LA Vol (A4C):    24.7 ml 15.58 ml/m LA Biplane Vol: 24.1 ml 15.21 ml/m  AORTIC VALVE AV Area (Vmax):    1.69 cm AV Area (Vmean):   1.85 cm AV Area (VTI):     1.86 cm AV Vmax:           165.00 cm/s AV Vmean:          86.200 cm/s AV VTI:            0.247 m AV Peak Grad:      10.9 mmHg AV Mean Grad:      4.0 mmHg LVOT Vmax:         123.00 cm/s LVOT Vmean:        70.300 cm/s LVOT VTI:          0.202 m LVOT/AV VTI ratio: 0.82 MITRAL VALVE               TRICUSPID VALVE MV Area (PHT): 4.33 cm    TR Peak grad:   33.4 mmHg MV Decel Time: 175 msec    TR Vmax:        289.00 cm/s MR Peak grad: 147.4 mmHg MR Mean grad: 111.0 mmHg   SHUNTS MR Vmax:      607.00 cm/s  Systemic VTI:  0.20 m MR Vmean:     500.0 cm/s   Systemic Diam: 1.70 cm MV E velocity: 66.70 cm/s MV A velocity: 99.00 cm/s MV E/A ratio:  0.67 Mihai Croitoru MD Electronically signed by Luana Rumple MD Signature Date/Time: 08/22/2023/2:33:18 PM    Final    IR IMAGING GUIDED PORT INSERTION Result Date: 08/21/2023 CLINICAL DATA:  B-CELL LYMPHOMA, ACCESS FOR CHEMOTHERAPY EXAM: RIGHT INTERNAL JUGULAR SINGLE LUMEN POWER PORT CATHETER INSERTION Date:  08/21/2023 08/21/2023 9:33 am Radiologist:  Melven Stable. Alyssa Jumper, MD Guidance:  Ultrasound and fluoroscopic MEDICATIONS: 1% lidocaine  local with epinephrine  ANESTHESIA/SEDATION: Versed  1.0  mg IV; Fentanyl  50 mcg IV; Moderate Sedation Time:  28 minute The patient was continuously monitored during the procedure by the interventional radiology nurse under my direct supervision. FLUOROSCOPY: One minutes, 24 seconds (15 mGy) COMPLICATIONS: None immediate. CONTRAST:  None. PROCEDURE: Informed consent was obtained from the patient following explanation of the procedure, risks, benefits and alternatives. The patient understands, agrees and consents for the procedure. All questions were addressed. A time out was performed. Maximal barrier sterile technique utilized including caps, mask, sterile gowns, sterile gloves, large sterile drape, hand  hygiene, and 2% chlorhexidine scrub. Under sterile conditions and local anesthesia, right internal JV micropuncture venous access was performed. Access was performed with ultrasound. Images were obtained for documentation of the patent right internal jugular vein. A guide wire was inserted followed by a transitional dilator. This allowed insertion of a guide wire and catheter into the IVC. Measurements were obtained from the SVC / RA junction back to the right IJ venotomy site. In the right infraclavicular chest, a subcutaneous pocket was created over the second anterior rib. This was done under sterile conditions and local anesthesia. 1% lidocaine  with epinephrine  was utilized for this. A 2.5 cm incision was made in the skin. Blunt dissection was performed to create a subcutaneous pocket over the right pectoralis major muscle. The pocket was flushed with saline vigorously. There was adequate hemostasis. The port catheter was assembled and checked for leakage. The port catheter was secured in the pocket with two retention sutures. The tubing was tunneled subcutaneously to the right venotomy site and inserted into the SVC/RA junction through a valved peel-away sheath. Position was confirmed with fluoroscopy. Images were obtained for documentation. The patient tolerated the procedure well. No immediate complications. Incisions were closed in a two layer fashion with 4 - 0 Vicryl suture. Dermabond was applied to the skin. The port catheter was accessed, blood was aspirated followed by saline and heparin  flushes. Needle was removed. A dry sterile dressing was applied. IMPRESSION: Ultrasound and fluoroscopically guided right internal jugular single lumen power port catheter insertion. Tip in the SVC/RA junction. Catheter ready for use. Electronically Signed   By: Melven Stable.  Shick M.D.   On: 08/21/2023 09:57   DG ESOPHAGUS W SINGLE CM (SOL OR THIN BA) Addendum Date: 08/19/2023 ADDENDUM REPORT: 08/19/2023 08:35 ADDENDUM:  Addendum for clarification of body of report. Hiatus hernia without evidence of stricture. Electronically Signed   By: Art Largo M.D.   On: 08/19/2023 08:35   Result Date: 08/19/2023 CLINICAL DATA:  dysphagia 83 year old female with dysphagia and globus sensation for esophagram. EXAM: ESOPHAGUS/BARIUM SWALLOW/TABLET STUDY TECHNIQUE: Single contrast examination was performed using thin liquid barium. This exam was performed by Abigail C. Emerson, PA-C, and was supervised and interpreted by Dr. Darylene Epley. FLUOROSCOPY: Radiation Exposure Index and estimated peak skin dose (PSD); Reference air kerma (RAK), 29.4 mGy. Kerma-area product (KAP), 409.9 uGy*m. COMPARISON:  CT AP, 12/02/2022 FINDINGS: Swallowing: Appears normal. No vestibular penetration or aspiration seen. Pharynx: Unremarkable. Esophagus: No mucosal lesions. Smooth, tapering stricture above gastroesophageal junction. Esophageal motility: Moderate esophageal dysmotility visualized with resultant contrast stasis and delayed emptying into the stomach. Hiatal Hernia: Mild hiatal hernia. Gastroesophageal reflux: None visualized, even with provacative maneuvers. Ingested 13mm barium tablet: Became stuck just above the gastroesophageal junction, where the tapering stricture is visualized. Waited 3 minutes with subsequent swallows of water and barium, tablet remained stuck. Pt informed the tablet will dissolve on its own. Other: None. IMPRESSION: Suboptimal evaluation, secondary to patient's inability to  remain upright. 1. Small hiatus hernia. 2. 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 Performed By Lorinda Root, PA-C and supervised by Art Largo, MD Electronically Signed: By: Art Largo M.D. On: 08/18/2023 09:35   DG Chest 2 View Result Date: 08/15/2023 CLINICAL DATA:  Shortness of breath. EXAM: CHEST - 2 VIEW COMPARISON:  Aug 09, 2023. FINDINGS: The heart size and mediastinal contours are  within normal limits. Both lungs are clear. The visualized skeletal structures are unremarkable. IMPRESSION: No active cardiopulmonary disease. Electronically Signed   By: Rosalene Colon M.D.   On: 08/15/2023 11:25   CT ABDOMEN PELVIS WO CONTRAST Result Date: 08/09/2023 CLINICAL DATA:  Abdominal pain no known history of lymphoma and recently positive biopsy EXAM: CT ABDOMEN AND PELVIS WITHOUT CONTRAST TECHNIQUE: Multidetector CT imaging of the abdomen and pelvis was performed following the standard protocol without IV contrast. RADIATION DOSE REDUCTION: This exam was performed according to the departmental dose-optimization program which includes automated exposure control, adjustment of the mA and/or kV according to patient size and/or use of iterative reconstruction technique. COMPARISON:  08/01/2023 FINDINGS: Lower chest: Previously seen patchy ground-glass opacities are again seen but improved when compared with the prior study. Hepatobiliary: Persistent perihepatic fluid is noted. The gallbladder is within normal limits. Pancreas: Unremarkable. No pancreatic ductal dilatation or surrounding inflammatory changes. Spleen: Normal in size without focal abnormality. Mild perisplenic fluid is noted. Adrenals/Urinary Tract: Left adrenal gland is within normal limits. Left kidney shows no acute abnormality. No obstructive changes are seen.The right kidney is envelop by a large soft tissue mass stable in appearance from the recent exam as well as a recent MRI. 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 The surrounding soft tissues extend inferiorly similar to that seen on the prior exam. The bladder is decompressed. Stomach/Bowel: No obstructive or inflammatory changes of the colon are seen. The appendix is not well visualized. Small bowel and stomach are unremarkable. Vascular/Lymphatic: Aortic atherosclerosis. No enlarged abdominal or  pelvic lymph nodes. Reproductive: Uterus has been surgically removed. Other: No abdominal wall hernia or abnormality. No abdominopelvic ascites. Musculoskeletal: No acute bony abnormality is noted. IMPRESSION: Stable appearing soft tissue mass lesion in the right abdomen envelop ping the right kidney with mild hydronephrosis as well as extending superiorly towards the liver and inferiorly into the pelvis. 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 Mild free fluid is noted within the abdomen stable from the prior study. No new focal abnormality is seen. Electronically Signed   By: Violeta Grey M.D.   On: 08/09/2023 22:51   DG Chest 2 View Result Date: 08/09/2023 CLINICAL DATA:  Shortness of breath EXAM: CHEST - 2 VIEW COMPARISON:  08/01/2023 FINDINGS: Heart mediastinal contours within normal limits. Questionable ill-defined patchy lower lobe opacities. No visible effusions or pneumothorax. No acute bony abnormality. IMPRESSION: Questionable vague lower lobe opacities, possible atelectasis or pneumonia. Electronically Signed   By: Janeece Mechanic M.D.   On: 08/09/2023 21:14   CT ABDOMINAL MASS BIOPSY Result Date: 08/04/2023 INDICATION: Retroperitoneal mass.  Personal history of lymphoma. EXAM: CT-guided core biopsy of right retroperitoneal mass MEDICATIONS: None. ANESTHESIA/SEDATION: Moderate (conscious) sedation was employed during this procedure. A total of Versed  1 mg and Fentanyl  50 mcg was administered intravenously by the radiology nurse. Total intra-service moderate Sedation Time: 17 minutes. The patient's level of consciousness and vital signs were monitored continuously by radiology nursing throughout the procedure under  my direct supervision. COMPLICATIONS: None immediate. PROCEDURE: Informed written consent was obtained from the patient after a thorough discussion of the procedural risks, benefits and alternatives. All questions were addressed. Maximal Sterile Barrier Technique was  utilized including caps, mask, sterile gowns, sterile gloves, sterile drape, hand hygiene and skin antiseptic. A timeout was performed prior to the initiation of the procedure. CT imaging was performed on the large right retroperitoneal mass was identified. A suitable skin entry site was selected and marked. Local anesthesia was attained by infiltration with 1% lidocaine . A small dermatotomy was made. Under intermittent CT guidance, a 17 gauge introducer needle was carefully advanced into the posterolateral aspect of the mass. Multiple 18 gauge core biopsies were then obtained coaxially using the bio Pince automated biopsy device. Biopsy specimens were placed in saline and delivered to pathology for further analysis. IMPRESSION: CT-guided core biopsy of right retroperitoneal mass. Electronically Signed   By: Fernando Hoyer M.D.   On: 08/04/2023 12:57   MR ABDOMEN WO CONTRAST Result Date: 08/02/2023 CLINICAL DATA:  Abnormal soft tissue in the right renal fossa on prior imaging. EXAM: MRI ABDOMEN WITHOUT CONTRAST TECHNIQUE: Multiplanar multisequence MR imaging was performed without the administration of intravenous contrast. COMPARISON:  CT stone study 08/01/2023. PET-CT 07/17/2023. Abdomen pelvis CT with contrast 06/14/2023. FINDINGS: Lower chest: No acute findings Hepatobiliary: No focal abnormality in the liver on noncontrast imaging. Gallbladder is nondistended with multiple tiny gallstones evident. No intrahepatic or extrahepatic biliary dilation. Pancreas: No focal mass lesion. No dilatation of the main duct. No intraparenchymal cyst. No peripancreatic edema. Spleen:  No splenomegaly. No suspicious focal mass lesion. Adrenals/Urinary Tract: Left adrenal gland unremarkable. Right adrenal gland not discretely visible. Mild fullness left intrarenal collecting system. Extensive abnormal soft tissue is seen in and around the right kidney and involving the right retroperitoneal space. 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 Punctate areas of signal void in the region of the right renal pelvis may be tiny calcified gallstones are foci of collecting system gas. This tissue shows marked diffusion restriction and tracks anteriorly to encase the descending and transverse duodenum and extends into the central abdominal mesentery. Inferior extent in the retroperitoneal space tracks down to the pelvis and right greater than left pelvic sidewall although this region is been incompletely visualized on this abdomen MRI. Lack of intravenous contrast hinders assessment. Stomach/Bowel: Tiny hiatal hernia. Stomach is nondilated. Duodenum is nondilated. No small bowel or colonic dilatation within the visualized abdomen. Vascular/Lymphatic: No abdominal aortic aneurysm. Abnormal soft tissue in the right retroperitoneum generates mass-effect on the IVC with anterior displacement in the upper abdomen although signal void in the lumen of the IVC suggests that it remains patent. Other: Areas of free fluid are seen at the inferior tip of the liver and right paracolic gutter. Scattered tiny fluid collections are seen in the retroperitoneal space. Diffuse mesenteric and body wall edema evident. Musculoskeletal: No overtly suspicious marrow signal abnormality. IMPRESSION: 1. Extensive abnormal soft tissue in and around the right kidney and involving the right retroperitoneal space. This tissue tracks anteriorly to encase the descending and transverse duodenum and extends into the central abdominal mesentery. Inferior extent in the retroperitoneal space tracks down to the pelvis and right greater than left pelvic sidewalls although this region has been incompletely visualized on this abdomen MRI. Lack of intravenous contrast hinders assessment. As on the recent PET-CT, given that most of the abnormal tissue shows restricted diffusion, this is likely related  to lymphoma given the patient's  history although, again, assessment is limited by lack of intravenous contrast material. Tissue sampling would likely prove helpful. 2. 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 Although there does not appear to be a large retroperitoneal fluid collection, underlying component of urine leak is not excluded. Punctate areas of signal void in the region of the right renal pelvis may be tiny calcified stones or foci of collecting system gas. 3. Small volume intraperitoneal free fluid. 4. Cholelithiasis. 5. Tiny hiatal hernia. 6. Diffuse mesenteric and body wall edema. Electronically Signed   By: Donnal Fusi M.D.   On: 08/02/2023 11:42   CT Renal Stone Study Result Date: 08/01/2023 CLINICAL DATA:  Abdominal/flank pain, stone suspected EXAM: CT ABDOMEN AND PELVIS WITHOUT CONTRAST TECHNIQUE: Multidetector CT imaging of the abdomen and pelvis was performed following the standard protocol without IV contrast. RADIATION DOSE REDUCTION: This exam was performed according to the departmental dose-optimization program which includes automated exposure control, adjustment of the mA and/or kV according to patient size and/or use of iterative reconstruction technique. COMPARISON:  June 14, 2023 FINDINGS: Of note, the lack of intravenous contrast limits evaluation of the solid organ parenchyma and vascularity. Lower chest: Interlobular septal thickening with patchy ground-glass opacities in the lung bases. No pleural effusion. Hepatobiliary: No mass. Decompressed gallbladder without radiopaque stones or wall thickening. No intrahepatic or extrahepatic biliary ductal dilation. Pancreas: No mass or main ductal dilation. No peripancreatic inflammation or fluid collection. None in the a he air Spleen: Normal size. No mass. Adrenals/Urinary Tract: No left adrenal mass. The right adrenal gland is not well visualized. The right renal parenchyma is not well visualized due to  the lack of intravenous contrast and large amount of soft tissue attenuation filling the right renal fossa, measuring 13.7 x 9.7 x 17.8 cm (unchanged from the prior PET-CT, but larger than on the prior CT, where it measured 10.5 x 9.9 x 15.9 cm). Moderate right-sided hydronephrosis with abrupt narrowing in the region of the UPJ, likely due to extrinsic compression. The urinary bladder is completely decompressed. Stomach/Bowel: The stomach is decompressed without focal abnormality. No small bowel wall thickening or inflammation. No small bowel obstruction. The appendix was not visualized. No right lower quadrant or pericecal inflammatory changes to suggest acute appendicitis. Vascular/Lymphatic: No aortic aneurysm. Diffuse aortoiliac atherosclerosis. Numerous subcentimeter upper retroperitoneal lymph nodes measuring up to 7 mm, likely reactive. Reproductive: Hysterectomy. No concerning adnexal mass. Other: No pneumoperitoneum. A large amount of right retroperitoneal hemorrhage extending into the pelvis with additional hemorrhage extending in throughout the left retroperitoneal space, into the root of the mesentery, and along the left retroperitoneal space. Small volume perihepatic and perisplenic ascites. Musculoskeletal: No acute fracture or destructive lesion. IMPRESSION: 1. Redemonstrated abnormal soft tissue density material filling the right renal fossa, measuring 13.7 x 9.7 x 17.8 cm (unchanged from the prior PET-CT, but larger than the prior CT from March 12, where it measured 10.5 x 9.9 x 5.9 cm). Worsening soft tissue extending throughout the right and left retroperitoneal spaces, into the mesentery, and into the pelvis. Given the appearance on the prior studies, this may represent worsening retroperitoneal hematoma or soft tissue infiltration from possible lymphoma given the patient's history. On the prior PET-CT, the increased radiotracer activity could also be related to underlying collecting system  injury in the setting of renal trauma. A multiphase CT of the abdomen and pelvis (with collecting phase imaging), recommended for further characterization. 2.  Moderate right-sided hydronephrosis with abrupt narrowing at the UPJ, likely due to extrinsic compression from the perinephric and retroperitoneal soft tissue. 3. Findings in the lung bases, worrisome for either a developing bronchopneumonia, which includes viral etiologies, or early pulmonary edema. Critical Value/emergent results were called by telephone at the time of interpretation on 08/01/2023 at 3:28 pm to provider DAN FLOYD , who verbally acknowledged these results. Electronically Signed   By: Rance Burrows M.D.   On: 08/01/2023 15:53   DG Chest Port 1 View Result Date: 08/01/2023 CLINICAL DATA:  Cough and shortness of breath. EXAM: PORTABLE CHEST 1 VIEW COMPARISON:  07/30/2023 FINDINGS: Single-view of the chest. Lungs are clear except for questionable mild blunting at the right costophrenic angle. Heart size is within normal limits and stable. Trachea is midline. Degenerative changes at the bilateral AC joints. IMPRESSION: 1. Questionable blunting at the right costophrenic angle. Cannot exclude small right pleural effusion. Otherwise, no acute chest finding. Electronically Signed   By: Elene Griffes M.D.   On: 08/01/2023 14:17   DG Chest 2 View Result Date: 07/30/2023 CLINICAL DATA:  Initial evaluation for shortness of breath. EXAM: CHEST - 2 VIEW COMPARISON:  Prior radiograph from 07/12/2023. FINDINGS: Transverse heart size stable, and remains within normal limits. Mediastinal silhouette within normal limits. Lungs are hypoinflated with scattered basilar predominant fibrotic lung changes, consistent with history of fibrotic lung disease. Slightly increased prominence of bibasilar interstitial markings could reflect superimposed interstitial edema or possibly atypical pneumonitis. No frank consolidative airspace disease. Trace right pleural  effusion. No overt pulmonary edema or pneumothorax. Visualized osseous structures and soft tissues demonstrate no acute finding. IMPRESSION: 1. Hypoinflation with scattered basilar predominant fibrotic lung changes, consistent with history of fibrotic lung disease. 2. Slightly increased prominence of bibasilar interstitial markings as compared to prior, which could reflect superimposed interstitial edema or possibly atypical pneumonitis. No consolidative airspace disease. 3. Trace right pleural effusion. Electronically Signed   By: Virgia Griffins M.D.   On: 07/30/2023 18:17   ADDENDUM  .Patient was Personally and independently interviewed, examined and relevant elements of the history of present illness were reviewed in details and an assessment and plan was created. All elements of the patient's history of present illness , assessment and plan were discussed in details with Liviana Mills NP. The above documentation reflects our combined findings assessment and plan.  Patient admitted with fatigue and AKI on CKD with concerns for TLS.  Poor PO intake due to dysphagia-- was seen by GI as outpatient and noted to have esophageal candidiasis and possible esophageal dysmotility and possible strict. This is limiting her po food nad fluid intake and leading to dehydration. Recommendations -started on IV fluconazole  -swallow evaluation -would recommend consulting GI for possible EGD -received 1 dose of Rasburicase --- will repeat if needed. -appreciate nephrology input for Mx of AKI and TLS -appreciate ID input regarding mx of Hep B -discussed labs and plan with patient and family at bedside.  .The total time spent in the appointment was 50 minutes* .  All of the patient's questions were answered with apparent satisfaction. The patient knows to call the clinic with any problems, questions or concerns.   Jacquelyn Matt MD MS AAHIVMS [facility] Hematology/Oncology Physician [facility]  .*Total Encounter Time as defined by the Centers for Medicare and Medicaid Services includes, in addition to the face-to-face time of a patient visit (documented in the note above) non-face-to-face time: obtaining and reviewing outside history, ordering and reviewing  medications, tests or procedures, care coordination (communications with other health care professionals or caregivers) and documentation in the medical record.

## 2023-08-29 NOTE — Evaluation (Signed)
 SLP Cancellation Note  Patient Details Name: Jacqueline Hancock MRN: [id number] DOB: [birth year]   Cancelled treatment:       Reason Eval/Treat Not Completed: Other (comment) (order for swallow evaluation received, note pt having nausea and on antiemetics; will see pt next date as ordered)  Maudie Sorrow, MS [facility] SLP Acute Rehab Services Office [phone]  Chantal Comment 08/29/2023, 2:18 PM

## 2023-08-29 NOTE — Progress Notes (Signed)
 [facility]  PROGRESS NOTE  Jacqueline Hancock:[id number] DOB: [birth year] DOA: 08/28/2023 PCP: Estil Heman, NP   Brief HPI:    83 y.o. 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  Denies any nausea vomiting diarrhea fever chills chest pain or shortness of breath.  Has abdominal fullness.  Due to persistent weakness patient was brought to the ER by the patient's family.   Dr. Salomon Cree who advised starting patient on rasburicase for possible tumor lysis syndrome and also ER physician discussed with Dr. Edson Graces who advised starting patient on bicarbonate infusion.  CT abdomen pelvis does not show anything acute but shows chronic changes.   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 Salomon Cree and also on-call nephrologist Dr. Edson Graces and rasburicase was ordered.  Uric acid level elevated, started on sodium bicarb infusion.  Diuretics on hold.  Nephrology has been consulted.  Follow uric acid level, renal function in a.m.  Leukocytosis -   ER physician discussed with Dr. Salomon Cree oncologist who felt that the leukocytosis could be from patient receiving G-CSF after her recent chemo.  Patient afebrile.  Follow blood culture results.  WBC is down to 25,000.  Hypotension-blood pressure has been soft this morning, will start midodrine 5 mg p.o. 3 times daily.  Received 250 cc normal saline bolus this morning.  Diastolic dysfunction per 2D echo done on 08/22/2023 which showed EF of 60 to 65% with grade 1 diastolic dysfunction.  Hold diuretics due to worsening renal function.  Large B-cell lymphoma transformation and CLL being followed by oncologist had received recently mini CHOP regimen.  Oncology, Dr. Salomon Cree  following   Anemia follow CBC.  Has had prior history of autoimmune hemolytic anemia.  History of hepatitis B surface antigen positivity-ID has been consulted prior to rituximab    Medications     Chlorhexidine Gluconate Cloth  6 each Topical Daily   heparin   5,000 Units Subcutaneous Q8H   mupirocin ointment  1 Application Nasal BID   senna-docusate  2 tablet Oral QHS     Data Reviewed:   CBG:  No results for input(s): "GLUCAP" in the last 168 hours.  SpO2: 96 %    Vitals:   08/29/23 0630 08/29/23 0700 08/29/23 0722 08/29/23 0730  BP: (!) 100/35 (!) 98/30 (!) 108/41 (!) 118/38  Pulse: 72 75 76 75  Resp: 17 20 (!) 24 20  Temp:      TempSrc:      SpO2: 96% 96% 96% 96%  Weight:      Height:          Data Reviewed:  Basic Metabolic Panel: Recent Labs  Lab 08/28/23 1518 08/29/23 0424  NA 136 133*  K 5.3* 4.6  CL 104 100  CO2 15* 19*  GLUCOSE 117* 126*  BUN 132* 126*  CREATININE 4.23* 4.11*  CALCIUM 8.3* 7.1*  MG 3.0*  --   PHOS 10.3* 9.1*    CBC: Recent Labs  Lab 08/28/23 1518 08/29/23 0424  WBC 39.6* 25.2*  NEUTROABS 38.8* 24.4*  HGB 9.8* 8.4*  HCT 32.3* 26.5*  MCV 89.0 87.7  PLT 197 163    LFT Recent Labs  Lab 08/28/23 1518 08/29/23 0424  AST 24 21  ALT 21 17  ALKPHOS 123 92  BILITOT 0.5 0.6  PROT 6.1* 5.1*  ALBUMIN 2.9* 2.3*     Antibiotics: Anti-infectives (From admission, onward)    None        DVT prophylaxis: Heparin   Code Status: Full code  Family Communication: Discussed with patient's granddaughter at bedside   CONSULTS nephrology   Subjective   Denies shortness of breath.  Denies pain   Objective    Physical Examination:   Appears in no acute distress S1-S2, regular, no murmur auscultated Abdomen is soft, nontender Extremities-bilateral 1+ edema in the lower extremities   Status is: Inpatient:             Ozell Blunt   [facility] If 7PM-7AM, please contact  night-coverage at [email or web address], Office  [phone]   08/29/2023, 8:01 AM  LOS: 1 day

## 2023-08-29 NOTE — Telephone Encounter (Signed)
 Pharmacy Patient Advocate Encounter  Insurance verification completed.    The patient is insured through [facility]. Patient has Medicare and is not eligible for a copay card, but may be able to apply for patient assistance or Medicare RX Payment Plan (Patient Must reach out to their plan, if eligible for payment plan), if available.    Ran test claim for entecavir 0.5mg  tablets and the current 30 day co-pay is $46.85.  Ran test claim for tenofovir 300mg  tablets and the current 30 day co-pay is $59.11  This test claim was processed through [employer]- copay amounts may vary at other pharmacies due to [employer], or as the patient moves through the different stages of their insurance plan.

## 2023-08-29 NOTE — Telephone Encounter (Signed)
 Pharmacy Patient Advocate Encounter  Insurance verification completed.    The patient is insured through [facility]. Patient has Medicare and is not eligible for a copay card, but may be able to apply for patient assistance or Medicare RX Payment Plan (Patient Must reach out to their plan, if eligible for payment plan), if available.    Ran test claim for Vemlidy 25mg  tablets and the current 30 day co-pay is $643.11.   This test claim was processed through [facility]- copay amounts may vary at other pharmacies due to pharmacy/plan contracts, or as the patient moves through the different stages of their insurance plan.

## 2023-08-29 NOTE — Consult Note (Signed)
 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 female with history of CLL, ILD, CKD 3 but recently having multiple hospitalizations. In march hospitalized for weakness, frequent falls found to have hypercalcemia up to 15, also had hospitalization from fall where she was found to have perinephric hematoma.Aaron Aas she was having increasing fatigue, lower extremity swelling, nightsweats which started at end of March which prompted chest CT for ILD and PET scan to evaluate for lymphoma. She was found to have retroperitoneal mass which underwent IR biopsy, where she was diagnosed with DLBCL.in the meantime her kidney function has steadily worsened. Dr. Salomon Cree has started her on mini-CHOP (R sparing regimen)since patient has hx of Hep B S Ag+.roughly 5 days prior to admission but patient having worsening weakness and family brought patient to hospital for evaluation. On admit, she had acute on chronic ckd with cr now at 4.2. WBC at 39K, LA of 3.4, and elevated uric acid which was concerning for Tumor lysis syndrome c/b right tissue mass/lymphoma enveloping right kidney contributing to AKI. ID is asked to weigh in on treatment for chronic hepatitis B to minimize flare in the setting of wanting to do R-mini CHOP regimen. Patient is quite weak, states she has had difficulty with eating  formed foods. Afebrile, but appears frail in bed. Her son and daughter in law are at bedside.   Hep b s ag+, hep b s ab negative, hep B VL 110 Past Medical History:  Diagnosis Date   CLL (chronic lymphocytic leukemia) (HCC)    Dyspnea 2021   with exersion    Early cataracts, bilateral    Elevated hemoglobin A1c    Fibroids    GERD (gastroesophageal reflux disease)    Hyperlipidemia    Hypertension    Lymphoma (HCC)    Osteopenia     Allergies:  Allergies  Allergen Reactions   Nexium [Esomeprazole Magnesium] Other (See Comments)    Headache   Peanut Butter Flavoring Agent (Non-Screening) Itching    Current antibiotics:   MEDICATIONS:  Chlorhexidine Gluconate Cloth  6 each Topical Daily   feeding supplement  1 Container Oral TID BM   Gerhardt's butt cream   Topical BID   heparin   5,000 Units Subcutaneous Q8H   midodrine  5 mg Oral TID WC   mupirocin ointment  1 Application Nasal BID   senna-docusate  2 tablet Oral QHS    Social History   Tobacco Use   Smoking status: Never   Smokeless tobacco: Never  Vaping Use   Vaping status: Never Used  Substance Use Topics   Alcohol use: No   Drug use: Never    Family History  Problem Relation Age of Onset   Leukemia Mother    Hypertension Father    Diabetes Sister    Heart disease  Brother    Diabetes Brother    Diabetes Brother    Hypertension Son    Heart disease Son     Review of Systems -  12 point ros is stated above.  OBJECTIVE: Temp:  [97.4 F (36.3 C)-98 F (36.7 C)] 97.9 F (36.6 C) (05/27 1532) Pulse Rate:  [62-76] 71 (05/27 1500) Resp:  [13-24] 18 (05/27 1500) BP: (93-118)/(29-53) 104/42 (05/27 1500) SpO2:  [93 %-99 %] 96 % (05/27 1500) Weight:  [63.6 kg] 63.6 kg (05/26 2142) Physical Exam  Constitutional:  oriented to person, place, and time. appears well-developed and frail.  No distress.  HENT: [place]/AT, PERRLA, no scleral icterus Mouth/Throat: Oropharynx is clear and moist. No oropharyngeal  exudate.  Cardiovascular: Normal rate, regular rhythm and normal heart sounds. Exam reveals no gallop and no friction rub.  No murmur heard.  Pulmonary/Chest: Effort normal and breath sounds normal. No respiratory distress.  has no wheezes.  Neck = supple, no nuchal rigidity Abdominal: Soft. Bowel sounds are normal.  exhibits no distension. There is no tenderness.  Ext:+2 pitting edema LE bilaterally Neurological: alert and oriented to person, place, and time.  Skin: Skin is warm and dry. No rash noted. No erythema.  Psychiatric: a normal mood and affect.  behavior is normal.    LABS: Results for orders placed or performed during the hospital encounter of 08/28/23 (from the past 48 hours)  Urinalysis, Routine w reflex microscopic -Urine, Clean Catch     Status: Abnormal   Collection Time: 08/28/23  4:56 AM  Result Value Ref Range   Color, Urine YELLOW YELLOW   APPearance CLOUDY (A) CLEAR   Specific Gravity, Urine 1.014 1.005 - 1.030   pH 5.0 5.0 - 8.0   Glucose, UA NEGATIVE NEGATIVE mg/dL   Hgb urine dipstick NEGATIVE NEGATIVE   Bilirubin Urine NEGATIVE NEGATIVE   Ketones, ur NEGATIVE NEGATIVE mg/dL   Protein, ur NEGATIVE NEGATIVE mg/dL   Nitrite NEGATIVE NEGATIVE   Leukocytes,Ua TRACE (A) NEGATIVE   RBC / HPF 0-5 0 - 5 RBC/hpf   WBC, UA 11-20 0 - 5 WBC/hpf   Bacteria, UA FEW (A) NONE SEEN   Squamous Epithelial / HPF 0-5 0 - 5 /HPF   Mucus PRESENT    Amorphous Crystal PRESENT    Non Squamous Epithelial 0-5 (A) NONE SEEN    Comment: Performed at [facility], [address], [place], [place] [address]  Comprehensive metabolic panel     Status: Abnormal   Collection Time: 08/28/23  3:18 PM  Result Value Ref Range   Sodium 136 135 - 145 mmol/L   Potassium 5.3 (H) 3.5 - 5.1 mmol/L   Chloride 104 98 - 111 mmol/L   CO2 15 (L) 22 - 32 mmol/L   Glucose, Bld 117 (H) 70 - 99 mg/dL    Comment: Glucose reference range applies only to samples taken after fasting for  at least 8 hours.   BUN 132 (H) 8 - 23 mg/dL    Comment: RESULT CONFIRMED BY MANUAL DILUTION   Creatinine, Ser 4.23 (H) 0.44 - 1.00 mg/dL   Calcium 8.3 (L) 8.9 - 10.3 mg/dL   Total Protein 6.1 (L) 6.5 - 8.1 g/dL   Albumin 2.9 (L) 3.5 - 5.0 g/dL   AST 24 15 - 41 U/L   ALT 21 0 - 44 U/L   Alkaline Phosphatase 123 38 - 126 U/L   Total Bilirubin 0.5 0.0 - 1.2 mg/dL   GFR, Estimated 10 (L) >  60 mL/min    Comment: (NOTE) Calculated using the CKD-EPI Creatinine Equation (2021)    Anion gap 17 (H) 5 - 15    Comment: Performed at [facility], [address], [place], [place] [address]  Lipase, blood     Status: Abnormal   Collection Time: 08/28/23  3:18 PM  Result Value Ref Range   Lipase 118 (H) 11 - 51 U/L    Comment: Performed at [facility], [address], [place], [place] [address]  CBC with Diff     Status: Abnormal   Collection Time: 08/28/23  3:18 PM  Result Value Ref Range   WBC 39.6 (H) 4.0 - 10.5 K/uL   RBC 3.63 (L) 3.87 - 5.11 MIL/uL   Hemoglobin 9.8 (L) 12.0 - 15.0 g/dL   HCT 09.8 (L) 11.9 - 14.7 %   MCV 89.0 80.0 - 100.0 fL   MCH 27.0 26.0 - 34.0 pg   MCHC 30.3 30.0 - 36.0 g/dL   RDW 82.9 56.2 - 13.0 %   Platelets 197 150 - 400 K/uL   nRBC 0.0 0.0 - 0.2 %   Neutrophils Relative % 98 %   Neutro Abs 38.8 (H) 1.7 - 7.7 K/uL   Lymphocytes Relative 1 %   Lymphs Abs 0.4 (L) 0.7 - 4.0 K/uL   Monocytes Relative 1 %   Monocytes Absolute 0.4 0.1 - 1.0 K/uL   Eosinophils Relative 0 %   Eosinophils Absolute 0.0 0.0 - 0.5 K/uL   Basophils Relative 0 %   Basophils Absolute 0.0 0.0 - 0.1 K/uL   WBC Morphology DOHLE BODIES     Comment: VACUOLATED NEUTROPHILS   Abs Immature Granulocytes 0.00 0.00 - 0.07 K/uL   Burr Cells PRESENT     Comment: Performed at [facility], [address] [place], [place] [address]  Magnesium     Status: Abnormal   Collection Time: 08/28/23  3:18 PM  Result Value Ref Range   Magnesium 3.0  (H) 1.7 - 2.4 mg/dL    Comment: Performed at [facility], [address] [place], [place] [address]  Uric acid     Status: Abnormal   Collection Time: 08/28/23  3:18 PM  Result Value Ref Range   Uric Acid, Serum 13.0 (H) 2.5 - 7.1 mg/dL    Comment: Performed at [facility], [address] [place], [place] [address]  Lactate dehydrogenase     Status: Abnormal   Collection Time: 08/28/23  3:18 PM  Result Value Ref Range   LDH 624 (H) 98 - 192 U/L    Comment: Performed at [facility], [address], [place], [place] [address]  Troponin I (High Sensitivity)     Status: Abnormal   Collection Time: 08/28/23  3:18 PM  Result Value Ref Range   Troponin I (High Sensitivity) 19 (H) <18 ng/L    Comment: (NOTE) Elevated high sensitivity troponin I (hsTnI) values and significant  changes across serial measurements may suggest ACS but many other  chronic and acute conditions are known to elevate hsTnI results.  Refer to the "Links" section for chest pain algorithms and additional  guidance. Performed at [facility], [address], [place], [place] [address]   Phosphorus     Status: Abnormal   Collection Time: 08/28/23  3:18 PM  Result Value Ref Range   Phosphorus 10.3 (H) 2.5 - 4.6 mg/dL    Comment: Performed at  [facility], [address], [place], [place] [address]  Procalcitonin     Status: None   Collection Time: 08/28/23  3:18 PM  Result Value Ref Range   Procalcitonin 2.04 ng/mL    Comment:        Interpretation: PCT > 2 ng/mL: Systemic infection (sepsis) is likely, unless other causes are known. (NOTE)       Sepsis PCT Algorithm           Lower Respiratory Tract                                      Infection PCT Algorithm    ----------------------------     ----------------------------         PCT < 0.25 ng/mL                PCT < 0.10 ng/mL          Strongly encourage              Strongly discourage   discontinuation of antibiotics    initiation of antibiotics    ----------------------------     -----------------------------       PCT 0.25 - 0.50 ng/mL            PCT 0.10 - 0.25 ng/mL               OR       >80% decrease in PCT            Discourage initiation of                                            antibiotics      Encourage discontinuation           of antibiotics    ----------------------------     -----------------------------         PCT >= 0.50 ng/mL              PCT 0.26 - 0.50 ng/mL               AND       <80% decrease in PCT              Encourage initiation of                                             antibiotics       Encourage continuation           of antibiotics    ----------------------------     -----------------------------        PCT >= 0.50 ng/mL                  PCT > 0.50 ng/mL               AND         increase in PCT                  Strongly encourage  initiation of antibiotics    Strongly encourage escalation           of antibiotics                                     -----------------------------                                           PCT <= 0.25 ng/mL                                                 OR                                        > 80% decrease in PCT                                      Discontinue / Do not initiate                                             antibiotics  Performed at [facility], [address], [place], [place] [address]   TSH     Status: None   Collection Time: 08/28/23  3:19 PM  Result Value Ref Range   TSH 2.193 0.350 - 4.500 uIU/mL    Comment: Performed by a 3rd Generation assay with a functional sensitivity of <=0.01 uIU/mL. Performed at [facility], [address], [place], [place] [address]   Type and screen [facility]     Status: None   Collection Time: 08/28/23  3:19 PM   Result Value Ref Range   ABO/RH(D) B POS    Antibody Screen NEG    Sample Expiration      08/31/2023,2359 Performed at [facility], [address] [place], [place] [address]   I-Stat Lactic Acid, ED     Status: Abnormal   Collection Time: 08/28/23  4:22 PM  Result Value Ref Range   Lactic Acid, Venous 3.4 (HH) 0.5 - 1.9 mmol/L   Comment NOTIFIED PHYSICIAN   Troponin I (High Sensitivity)     Status: None   Collection Time: 08/28/23  6:48 PM  Result Value Ref Range   Troponin I (High Sensitivity) 9 <18 ng/L    Comment: (NOTE) Elevated high sensitivity troponin I (hsTnI) values and significant  changes across serial measurements may suggest ACS but many other  chronic and acute conditions are known to elevate hsTnI results.  Refer to the "Links" section for chest pain algorithms and additional  guidance. Performed at [facility], [address] [place], [place] [address]   MRSA Next Gen by PCR, Nasal     Status: Abnormal   Collection Time: 08/28/23  9:51 PM   Specimen: Nasal Mucosa; Nasal Swab  Result Value Ref Range   MRSA by PCR Next  Gen DETECTED (A) NOT DETECTED    Comment: (NOTE) The GeneXpert MRSA Assay (FDA approved for NASAL specimens only), is one component of a comprehensive MRSA colonization surveillance program. It is not intended to diagnose MRSA infection nor to guide or monitor treatment for MRSA infections. Test performance is not FDA approved in patients less than 40 years old. Performed at [facility], [address] [place], [place] [address]   Rasburicase - Uric Acid (special collection / handling)     Status: Abnormal   Collection Time: 08/28/23 11:46 PM  Result Value Ref Range   Uric Acid, Serum 10.0 (H) 2.5 - 7.1 mg/dL    Comment: Performed at [facility], [address], [place], [place] [address]  Basic metabolic panel     Status: Abnormal   Collection Time: 08/29/23   4:24 AM  Result Value Ref Range   Sodium 133 (L) 135 - 145 mmol/L   Potassium 4.6 3.5 - 5.1 mmol/L   Chloride 100 98 - 111 mmol/L   CO2 19 (L) 22 - 32 mmol/L   Glucose, Bld 126 (H) 70 - 99 mg/dL    Comment: Glucose reference range applies only to samples taken after fasting for at least 8 hours.   BUN 126 (H) 8 - 23 mg/dL    Comment: RESULT CONFIRMED BY MANUAL DILUTION   Creatinine, Ser 4.11 (H) 0.44 - 1.00 mg/dL   Calcium 7.1 (L) 8.9 - 10.3 mg/dL   GFR, Estimated 10 (L) >60 mL/min    Comment: (NOTE) Calculated using the CKD-EPI Creatinine Equation (2021)    Anion gap 14 5 - 15    Comment: Performed at [facility], [address], [place], [place] [address]  Hepatic function panel     Status: Abnormal   Collection Time: 08/29/23  4:24 AM  Result Value Ref Range   Total Protein 5.1 (L) 6.5 - 8.1 g/dL   Albumin 2.3 (L) 3.5 - 5.0 g/dL   AST 21 15 - 41 U/L   ALT 17 0 - 44 U/L   Alkaline Phosphatase 92 38 - 126 U/L   Total Bilirubin 0.6 0.0 - 1.2 mg/dL   Bilirubin, Direct 0.2 0.0 - 0.2 mg/dL   Indirect Bilirubin 0.4 0.3 - 0.9 mg/dL    Comment: Performed at [facility], [address] [place], [place] [address]  Phosphorus     Status: Abnormal   Collection Time: 08/29/23  4:24 AM  Result Value Ref Range   Phosphorus 9.1 (H) 2.5 - 4.6 mg/dL    Comment: Performed at [facility], [address] [place], [place] [address]  CBC with Differential/Platelet     Status: Abnormal   Collection Time: 08/29/23  4:24 AM  Result Value Ref Range   WBC 25.2 (H) 4.0 - 10.5 K/uL   RBC 3.02 (L) 3.87 - 5.11 MIL/uL   Hemoglobin 8.4 (L) 12.0 - 15.0 g/dL   HCT 13.0 (L) 86.5 - 78.4 %   MCV 87.7 80.0 - 100.0 fL   MCH 27.8 26.0 - 34.0 pg   MCHC 31.7 30.0 - 36.0 g/dL   RDW 69.6 29.5 - 28.4 %   Platelets 163 150 - 400 K/uL   nRBC 0.0 0.0 - 0.2 %   Neutrophils Relative % 94 %   Neutro Abs 24.4 (H) 1.7 - 7.7 K/uL   Band Neutrophils 3 %    Lymphocytes Relative 1 %   Lymphs Abs 0.3 (L)  0.7 - 4.0 K/uL   Monocytes Relative 2 %   Monocytes Absolute 0.5 0.1 - 1.0 K/uL   Eosinophils Relative 0 %   Eosinophils Absolute 0.0 0.0 - 0.5 K/uL   Basophils Relative 0 %   Basophils Absolute 0.0 0.0 - 0.1 K/uL   WBC Morphology DOHLE BODIES    Smear Review MORPHOLOGY UNREMARKABLE    Abs Immature Granulocytes 0.00 0.00 - 0.07 K/uL   Burr Cells PRESENT     Comment: Performed at [facility], [address], [place], [place] [address]    MICRO:  IMAGING: CT ABDOMEN PELVIS WO CONTRAST Result Date: 08/28/2023 CLINICAL DATA:  Failure to thrive. 1st chemotherapy this week. Known lymphoma. * Tracking Code: BO * EXAM: CT ABDOMEN AND PELVIS WITHOUT CONTRAST TECHNIQUE: Multidetector CT imaging of the abdomen and pelvis was performed following the standard protocol without IV contrast. RADIATION DOSE REDUCTION: This exam was performed according to the departmental dose-optimization program which includes automated exposure control, adjustment of the mA and/or kV according to patient size and/or use of iterative reconstruction technique. COMPARISON:  08/09/2023 FINDINGS: Lower chest: Initial thickening at the lung bases likely due to subsegmental atelectasis and/or air trapping. Normal heart size without pericardial or pleural effusion. Hepatobiliary: Normal noncontrast appearance of the liver, gallbladder. No gross intrahepatic biliary duct dilatation. Pancreas: Normal, without mass or ductal dilatation. Spleen: Normal in size, without focal abnormality. Adrenals/Urinary Tract: Normal left adrenal gland. No left renal calculi. Mild left-sided caliectasis is unchanged. The dominant mass centered in the right abdominal retroperitoneum is amorphous, ill-defined and difficult to on this noncontrast exam. Estimated at 12.6 by 9.6 cm on 38/2, relatively similar to 08/09/2023. This is encompasses and obscures the right adrenal gland right kidney.  Amorphous soft tissue extends into the left-sided abdominal retroperitoneum including on 43/2, relatively similar. Soft tissue thickening is most apparent about the posterior bladder wall with suggestion of soft tissue fullness about the right bladder base including on 73/2. This is less masslike than on the prior exam. Stomach/Bowel: Tiny hiatal hernia. No gastric outlet obstruction. Normal colon and terminal ileum. Normal small bowel caliber. Vascular/Lymphatic: Aortic atherosclerosis. Prominent but not pathologically sized abdominal retroperitoneal nodes. Reproductive: Hysterectomy.  No adnexal mass. Other: Pelvic floor laxity. Small volume perihepatic ascites, decreased. Musculoskeletal: No acute osseous abnormality. IMPRESSION: 1. Limited exam secondary to lack of oral or IV contrast. 2. Grossly similar appearance of amorphous, ill-defined soft tissue density mass within the abdominopelvic right-sided retroperitoneum. Extension or adjacent presumed lymphoma within the left-sided abdominal retroperitoneum. 3. No gross bowel obstruction or other acute complication. 4. Similar mild left-sided caliectasis, likely due to left abdominal retroperitoneal lymphoma. 5. Soft tissue thickening about the posterior bladder wall with decreased soft tissue fullness about the right bladder base. Suspicious for lymphomatous involvement. 6. Decrease in small volume perihepatic ascites 7. Incidental findings, including: Aortic Atherosclerosis (ICD10-I70.0). Tiny hiatal hernia. Electronically Signed   By: Lore Rode M.D.   On: 08/28/2023 16:51   DG Chest Portable 1 View Result Date: 08/28/2023 CLINICAL DATA:  Weakness, lightheadedness, dizziness, history of lymphoma EXAM: PORTABLE CHEST 1 VIEW COMPARISON:  08/15/2023 FINDINGS: Single frontal view of the chest demonstrates right chest wall port via internal jugular approach, tip overlying superior vena cava. Cardiac silhouette is unremarkable. No acute airspace disease,  effusion, or pneumothorax. No acute bony abnormalities. IMPRESSION: 1. No acute intrathoracic process. Electronically Signed   By: Bobbye Burrow M.D.   On: 08/28/2023 15:31    HISTORICAL MICRO/IMAGING  Assessment/Plan:  83yo F with hx of CLL now transformed to LBCL with large tumor burden in retroperitoneal space, enveloping right kidney with acute aki with cr 4 bun 123,  Also hx of chronic hep B since hep B S ag+, but low viral load.  -recommend to start with entecavir as the antiviral agent less impact on kidney function. - we will plan to dose per her renal function to ensure that she is adequately treated - since she has low viral load, suspect can start Rituxan treatment in 1 weeks time. Recommendation is usually 1-2 wk of hep b treatment before starting rituxan.  - will continue to monitor for other opportunistic infections while hospitalized - at this time does not appear to have thrush  I have personally spent 85 minutes involved in face-to-face and non-face-to-face activities for this patient on the day of the visit. Professional time spent includes the following activities: Preparing to see the patient (review of tests), Obtaining and/or reviewing separately obtained history (admission/discharge record), Performing a medically appropriate examination and/or evaluation , Ordering medications/tests/procedures, referring and communicating with other health care professionals, Documenting clinical information in the EMR, Independently interpreting results (not separately reported), Communicating results to the patient and son and daughter in law.

## 2023-08-30 DIAGNOSIS — R1319 Other dysphagia: Secondary | ICD-10-CM | POA: Diagnosis not present

## 2023-08-30 DIAGNOSIS — R1310 Dysphagia, unspecified: Secondary | ICD-10-CM | POA: Diagnosis not present

## 2023-08-30 DIAGNOSIS — E883 Tumor lysis syndrome: Secondary | ICD-10-CM | POA: Diagnosis not present

## 2023-08-30 DIAGNOSIS — K269 Duodenal ulcer, unspecified as acute or chronic, without hemorrhage or perforation: Secondary | ICD-10-CM | POA: Diagnosis not present

## 2023-08-30 DIAGNOSIS — R112 Nausea with vomiting, unspecified: Secondary | ICD-10-CM

## 2023-08-30 DIAGNOSIS — B3781 Candidal esophagitis: Secondary | ICD-10-CM | POA: Diagnosis not present

## 2023-08-30 DIAGNOSIS — D591 Autoimmune hemolytic anemia, unspecified: Secondary | ICD-10-CM

## 2023-08-30 DIAGNOSIS — K222 Esophageal obstruction: Secondary | ICD-10-CM | POA: Diagnosis not present

## 2023-08-30 DIAGNOSIS — R531 Weakness: Secondary | ICD-10-CM

## 2023-08-30 DIAGNOSIS — N183 Chronic kidney disease, stage 3 unspecified: Secondary | ICD-10-CM

## 2023-08-30 DIAGNOSIS — C8338 Diffuse large B-cell lymphoma, lymph nodes of multiple sites: Secondary | ICD-10-CM | POA: Diagnosis not present

## 2023-08-30 DIAGNOSIS — K224 Dyskinesia of esophagus: Secondary | ICD-10-CM

## 2023-08-30 DIAGNOSIS — R1013 Epigastric pain: Secondary | ICD-10-CM

## 2023-08-30 DIAGNOSIS — N179 Acute kidney failure, unspecified: Secondary | ICD-10-CM | POA: Diagnosis not present

## 2023-08-30 LAB — COMPREHENSIVE METABOLIC PANEL WITH GFR
ALT: 13 U/L (ref 0–44)
AST: 21 U/L (ref 15–41)
Albumin: 2.2 g/dL — ABNORMAL LOW (ref 3.5–5.0)
Alkaline Phosphatase: 84 U/L (ref 38–126)
Anion gap: 16 — ABNORMAL HIGH (ref 5–15)
BUN: 115 mg/dL — ABNORMAL HIGH (ref 8–23)
CO2: 23 mmol/L (ref 22–32)
Calcium: 6.7 mg/dL — ABNORMAL LOW (ref 8.9–10.3)
Chloride: 100 mmol/L (ref 98–111)
Creatinine, Ser: 3.94 mg/dL — ABNORMAL HIGH (ref 0.44–1.00)
GFR, Estimated: 11 mL/min — ABNORMAL LOW (ref >60)
Glucose, Bld: 131 mg/dL — ABNORMAL HIGH (ref 70–99)
Potassium: 4.4 mmol/L (ref 3.5–5.1)
Sodium: 139 mmol/L (ref 135–145)
Total Bilirubin: 0.8 mg/dL (ref 0.0–1.2)
Total Protein: 4.8 g/dL — ABNORMAL LOW (ref 6.5–8.1)

## 2023-08-30 LAB — CBC
HCT: 25.6 % — ABNORMAL LOW (ref 36.0–46.0)
Hemoglobin: 8 g/dL — ABNORMAL LOW (ref 12.0–15.0)
MCH: 27.5 pg (ref 26.0–34.0)
MCHC: 31.3 g/dL (ref 30.0–36.0)
MCV: 88 fL (ref 80.0–100.0)
Platelets: 129 10*3/uL — ABNORMAL LOW (ref 150–400)
RBC: 2.91 MIL/uL — ABNORMAL LOW (ref 3.87–5.11)
RDW: 13.5 % (ref 11.5–15.5)
WBC: 13.8 10*3/uL — ABNORMAL HIGH (ref 4.0–10.5)
nRBC: 0 % (ref 0.0–0.2)

## 2023-08-30 LAB — URIC ACID: Uric Acid, Serum: 5.4 mg/dL (ref 2.5–7.1)

## 2023-08-30 MED ORDER — FLUCONAZOLE 100MG IVPB
100.0000 mg | INTRAVENOUS | Status: DC
  Administered 2023-08-30: 100 mg via INTRAVENOUS
  Filled 2023-08-30 (×2): qty 50

## 2023-08-30 MED ORDER — LACTATED RINGERS IV SOLN: INTRAVENOUS | Status: AC

## 2023-08-30 MED ORDER — FLUCONAZOLE IN SODIUM CHLORIDE 200-0.9 MG/100ML-% IV SOLN
200.0000 mg | Freq: Once | INTRAVENOUS | Status: AC
  Administered 2023-08-30: 200 mg via INTRAVENOUS
  Filled 2023-08-30: qty 100

## 2023-08-30 MED ORDER — PHENOL 1.4 % MT LIQD: 1.0000 | OROMUCOSAL | Status: DC | PRN

## 2023-08-30 MED ORDER — PANTOPRAZOLE SODIUM 40 MG IV SOLR
40.0000 mg | INTRAVENOUS | Status: DC
  Administered 2023-08-30 – 2023-08-31 (×2): 40 mg via INTRAVENOUS
  Filled 2023-08-30 (×2): qty 10

## 2023-08-30 MED ORDER — PANTOPRAZOLE SODIUM 40 MG PO TBEC: 40.0000 mg | DELAYED_RELEASE_TABLET | Freq: Every day | ORAL | Status: DC

## 2023-08-30 NOTE — Progress Notes (Signed)
 In report, RN was notified of patient's inability to swallow pills crushed in applesauce. Patient had held meds in her mouth until she had to spit them out. Patient refused sips of water and attempts to take pills this AM for RN. Upon assessment, patient is spitting out her saliva instead of swallowing. Dr Joan Mouton notified of inability to safely give PO meds at this time. SLP notified as well.

## 2023-08-30 NOTE — Progress Notes (Signed)
  Progress Note   Patient: Jacqueline Hancock ZOX:[id number] DOB: 1940-04-22 DOA: 08/28/2023     2 DOS: the patient was seen and examined on 08/30/2023   Brief hospital course: 83 y.o. 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  Denies any nausea vomiting diarrhea fever chills chest pain or shortness of breath.  Has abdominal fullness.  Due to persistent weakness patient was brought to the ER by the patient's family.   Dr. Salomon Cree who advised starting patient on rasburicase for possible tumor lysis syndrome and also ER physician discussed with Dr. Edson Graces who advised starting patient on bicarbonate infusion.  CT abdomen pelvis does not show anything acute but shows chronic changes.   Assessment and Plan:  Acute renal failure on chronic kidney disease stage III with concern for possible tumor lysis syndrome with effective solitary functioning kidney from mass encompassing R kidney -     Nephrology and oncology following Pt is s/p rasburicase  Continued on sodium bicarb infusion. Discussed with Nephrology. Concerns that pt would not be an ideal dialysis candidate if needed. Will consult Palliative Care to address goals of care Cr stable 3.94 this AM   Leukocytosis -    Oncology following, who felt that the leukocytosis could be from patient receiving G-CSF after her recent chemo. B blood culture results neg. WBC down to 13.8k  Hypotension blood pressure had been soft recently Continued on midodrine 5 mg p.o. 3 times daily.   Diastolic dysfunction  2D echo done on 08/22/2023 which showed EF of 60 to 65% with grade 1 diastolic dysfunction.   held diuretics due to worsening renal function.   Large B-cell lymphoma transformation and CLL being followed by oncologist had received recently mini CHOP regimen Oncology following     Anemia Hgb seems stable at this time Recheck CBC in AM   History of hepatitis B surface antigen positivity ID following Recs to start entecavir and then Rituxan in 1 weeks time per ID recs      Subjective: Difficult to assess given mentation  Physical Exam: Vitals:   08/30/23 1003 08/30/23 1100 08/30/23 1200 08/30/23 1300  BP:  (!) 105/39 (!) 107/40 (!) 103/41  Pulse: 74 72 73 71  Resp: 18 (!) 24 16 (!) 21  Temp:   98 F (36.7 C)   TempSrc:   Oral   SpO2: 95% 94% 96% 95%  Weight:      Height:       General exam: Awake, laying in bed, in nad Respiratory system: Normal respiratory effort, no wheezing Cardiovascular system: regular rate, s1, s2 Gastrointestinal system: Soft, nondistended, positive BS Central nervous system: CN2-12 grossly intact, strength intact Extremities: Perfused, no clubbing Skin: Normal skin turgor, no notable skin lesions seen Psychiatry: Difficult to assess given mentation  Data Reviewed:  Labs reviewed: Na 139, K 4.4, Cr 3.94, Uric Acid 5.4, WBC 13.8, Hgb 8.0, Plts 129  Family Communication: Pt in room, family not at bedside  Disposition: Status is: Inpatient Remains inpatient appropriate because: severity of illness  Planned Discharge Destination: Unclear at this time    Author: Cherylle Corwin, MD 08/30/2023 2:01 PM  For on call review [email or web address].

## 2023-08-30 NOTE — Progress Notes (Signed)
 Patient ID: Jacqueline Hancock, female   DOB: 20-Oct-1940, 83 y.o.   MRN: [id number] Request received from nephrology for possible nephrostomy tube placement in patient. Latest imaging studies were reviewed by Dr. Julietta Ogren. Per his review, we cannot place right nephrostomy.  Could discuss with urology regarding placement of retrograde stents . 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

## 2023-08-30 NOTE — H&P (View-Only) (Signed)
 Referring Provider: Dr. Cherylle Corwin Primary Care Physician:  Estil Heman, NP Primary Gastroenterologist:  Dr. Yvone Herd  Reason for Consultation: Dysphagia  HPI: Jacqueline Hancock is a 83 y.o. female with a past medical history of hypertension, hyperlipidemia, CLL, recently diagnosed with B cell lymphoma, CKD stage IIIb, osteopenia, GERD and colon polyps.  She was recently admitted to the hospital 08/01/2023 with fatigue and AKI on CKD in setting of a known large right perinephric abdominal mass seen on PET scan 07/17/2023. She underwent a biopsy of the abdominal mass per IR 5/2, path report confirmed B-cell lymphoproliferative disorder. She was discharged home on 08/05/2023. She was evaluated by oncologist Dr. Salomon Cree  08/16/2023 and she was started on chemo (R-mini CHOP) first infusion received 5/21. During her workup, she underwent hepatitis B serologies 08/18/2023 which showed a positive hepatitis B surface antigen, positive hepatitis B core total antibody, negative hepatitis B surface antibody and a low hepatitis B DNA level 110.  Prior labs 03/13/2019 showed a positive hepatitis B surface antigen and positive hep B core antibody. She was referred to ID to treat chronic hepatitis B.  She was seen in our outpatient GI clinic by Santina Cull, PA-C 08/09/2023 for further evaluation regarding dysphagia which had progressively worsened over the past 2 to 3 months. She was prescribed Omeprazole  20mg  every day and she was treated with a course of Nystatin  oral suspension for suspected esophageal candidiasis. A barium swallow study was done 08/18/2023 which showed a small hiatal hernia, mild to moderate esophageal dysmotility with tertiary contractions consistent with presbyesophagus, the barium tablet became stuck above the GE junction and tapering stricture was visualized. An EGD with esophageal dilatation as an outpatient at [facility] was planned.  She developed significant fatigue with persistent N/V  which worsened 3 days following her chemo infusion. She had a near syncopal episode on 5/26 with progressively worsening fatigue therefore she presented to the ED for further evaluation.  Labs in the ED showed a WBC count of 39.6.  Hemoglobin 9.8.  Platelets 197.  Potassium 5.3.  BUN 132 up from 43 twelve days prior to admission.  Creatinine 4.23 up from 2.91.  Albumin  2.9.  Total bili 0.5.  Alk phos 123.  AST 24.  ALT 21.  Lipase 118.  LDH 624.  TSH 2.193.  Chest x-ray was negative.  CTAP without contrast showed a similar appearance of the ill-defined soft tissue density mass within the abdominal pelvic right sided retroperitoneum with extension versus adjacent presumed lymphoma within the left-sided abdominal retroperitoneum, left-sided caliectasis likely due to left abdominal retroperitoneal lymphoma, soft tissue thickening about the posterior bladder wall with decreased soft tissue fullness to the right bladder base suspicious for lymphomatous involvement, decreased in perihepatic ascites and aortic atherosclerosis.  A GI consult was requested for further evaluation regarding persistent nausea, vomiting and abdominal pain. Food passes down the esophagus but gets stuck in the lower esophagus or at the top of her stomach which occurs every time she eats and results in vomiting white foam multiple times daily for the past few months. No coffee-ground or frank hematemesis. She is taking Omeprazole  once daily as previously prescribed.  No heartburn. She is passing normal brown bowel movements at home. No bloody or black stools. Last BM was 5/26. No NSAID use. Denies ever having an EGD. Non-smoker. No alcohol use.  She was seen by Dr. Arzella Laurence infectious disease specialist 08/29/2023 for hepatitis B treatment in setting of chemotherapy for B-cell lymphoma.  Patient was started on Entecavir  she will require close renal monitoring.  Patient denied any prior history of hepatitis B.  Received a blood transfusion  in 1972.  No known family history of liver disease.  Barium Swallow Study with Tablet 08/18/2023: FINDINGS: Swallowing: Appears normal. No vestibular penetration or aspiration seen.   Pharynx: Unremarkable.   Esophagus: No mucosal lesions. Smooth, tapering stricture above gastroesophageal junction.   Esophageal motility: Moderate esophageal dysmotility visualized with resultant contrast stasis and delayed emptying into the stomach.   Hiatal Hernia: Mild hiatal hernia.   Gastroesophageal reflux: None visualized, even with provacative maneuvers.   Ingested 13mm barium tablet: Became stuck just above the gastroesophageal junction, where the tapering stricture is visualized. Waited 3 minutes with subsequent swallows of water and barium, tablet remained stuck. Pt informed the tablet will dissolve on its own.   Other: None.   IMPRESSION: Suboptimal evaluation, secondary to patient's inability to remain upright.   1. Small hiatus hernia. 2. 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    ECHO 08/22/2023: IMPRESSIONS Left ventricular ejection fraction, by estimation, is 60 to 65%. The left ventricle has normal function. The left ventricle has no regional wall motion abnormalities. Left ventricular diastolic parameters are consistent with Grade I diastolic dysfunction (impaired relaxation). The average left ventricular global longitudinal strain is -17.5 %. The global longitudinal strain is normal. 1. 2. Right ventricular systolic function is normal. The right ventricular size is normal. The mitral valve is normal in structure. Mild to moderate mitral valve regurgitation. No evidence of mitral stenosis. 3. The aortic valve is grossly normal. Aortic valve regurgitation is trivial. No aortic stenosis is present. 4. The inferior vena cava is normal in size with greater than 50% respiratory variability, suggesting right atrial pressure  of 3 mmHg.  GI PROCEDURES:  Colonoscopy 05/23/2017: Normal colonoscopy Recall colonoscopy 5 years  Colonoscopy 04/19/2012: Colon polyps, procedure report not found in Care Everywhere.  Past Medical History:  Diagnosis Date   CLL (chronic lymphocytic leukemia) (HCC)    Dyspnea 2021   with exersion    Early cataracts, bilateral    Elevated hemoglobin A1c    Fibroids    GERD (gastroesophageal reflux disease)    Hyperlipidemia    Hypertension    Lymphoma (HCC)    Osteopenia     Past Surgical History:  Procedure Laterality Date   ABDOMINAL HYSTERECTOMY     APPENDECTOMY     CATARACT EXTRACTION, BILATERAL Bilateral 09/2017   Dr. Celina Colla   IR IMAGING GUIDED PORT INSERTION  08/21/2023   PAROTID GLAND TUMOR EXCISION  2012   benign   TONSILLECTOMY AND ADENOIDECTOMY      Prior to Admission medications   Medication Sig Start Date End Date Taking? Authorizing Provider  allopurinol  (ZYLOPRIM ) 100 MG tablet TAKE 1 TABLET(100 MG) BY MOUTH TWICE DAILY 08/21/23  Yes Frankie Israel, MD  amLODipine  (NORVASC ) 5 MG tablet Take 1 tablet (5 mg total) by mouth daily. 06/14/23  Yes Ngetich, Dinah C, NP  B Complex Vitamins (VITAMIN B-COMPLEX PO) Take 1 tablet by mouth daily.   Yes [provider]  furosemide  (LASIX ) 20 MG tablet TAKE 1 TABLET(20 MG) BY MOUTH DAILY Patient taking differently: Take 20 mg by mouth daily. 08/21/23  Yes Frankie Israel, MD  loratadine (CLARITIN) 10 MG tablet Take 10 mg by mouth daily.   Yes [provider]  nadolol  (CORGARD ) 20 MG tablet TAKE 1 TABLET BY MOUTH DAILY FOR BLOOD PRESSURE  OR TREMORS Patient taking differently: Take 20 mg by mouth daily. 07/27/23  Yes Ngetich, Dinah C, NP  omeprazole  (PRILOSEC) 20 MG capsule Open capsule and sprinkle on food once daily Patient taking differently: Take 20 mg by mouth daily. 08/09/23  Yes Santina Cull R, PA-C  ondansetron  (ZOFRAN ) 8 MG tablet Take 1 tablet (8 mg total) by mouth every 8 (eight) hours  as needed for nausea or vomiting. Start on the third day after cyclophosphamide chemotherapy. Patient taking differently: Take 8 mg by mouth daily as needed for nausea or vomiting. 08/18/23  Yes Kale, Gautam Kishore, MD  polyethylene glycol (MIRALAX  / GLYCOLAX ) 17 g packet Take 17 g by mouth daily as needed for mild constipation or moderate constipation. Also available OTC 06/18/23  Yes Rai, Ripudeep K, MD  prochlorperazine  (COMPAZINE ) 10 MG tablet Take 1 tablet (10 mg total) by mouth every 6 (six) hours as needed for nausea or vomiting. 08/18/23  Yes Frankie Israel, MD  senna-docusate (SENOKOT-S) 8.6-50 MG tablet Take 2 tablets by mouth at bedtime. For constipation 06/18/23  Yes Rai, Ripudeep K, MD  torsemide  (DEMADEX ) 20 MG tablet Take 20 mg by mouth daily as needed (fluid). 08/17/23  Yes [provider]  traMADol  (ULTRAM ) 50 MG tablet Take 1 tablet (50 mg total) by mouth every 8 (eight) hours as needed for moderate pain (pain score 4-6) or severe pain (pain score 7-10) (pain). Patient taking differently: Take 50 mg by mouth daily as needed for moderate pain (pain score 4-6) or severe pain (pain score 7-10) (pain). 06/18/23  Yes Rai, Ripudeep K, MD  amoxicillin -clavulanate (AUGMENTIN ) 875-125 MG tablet Take 1 tablet by mouth every 12 (twelve) hours. Patient not taking: Reported on 08/29/2023 08/09/23   Kingsley, Victoria K, DO  azithromycin  (ZITHROMAX ) 250 MG tablet Take 1 tablet (250 mg total) by mouth daily. Starting tomorrow, take 1 every day until finished. Patient not taking: Reported on 08/29/2023 08/09/23   Kingsley, Victoria K, DO  lidocaine -prilocaine  (EMLA ) cream Apply to affected area once Patient not taking: Reported on 08/29/2023 08/18/23   Frankie Israel, MD  losartan  (COZAAR ) 50 MG tablet Take 50 mg by mouth daily. Patient not taking: Reported on 08/29/2023    [provider]  nystatin  (MYCOSTATIN ) 100000 UNIT/ML suspension Take 5 mLs (500,000 Units total) by mouth 4  (four) times daily. Patient not taking: Reported on 08/29/2023 08/09/23   Edmonia Gottron, PA-C  gabapentin  (NEURONTIN ) 100 MG capsule Take 1 capsule 3 x /day for Neuropathy Pain Patient not taking: Reported on 01/25/2020 08/02/19 01/25/20  Vangie Genet, MD    Current Facility-Administered Medications  Medication Dose Route Frequency Provider Last Rate Last Admin   Chlorhexidine Gluconate Cloth 2 % PADS 6 each  6 each Topical Daily Angelene Kelly, MD   6 each at 08/30/23 0935   entecavir (BARACLUDE) tablet 0.5 mg  0.5 mg Oral Q Wed Snider, Cynthia, MD   0.5 mg at 08/30/23 1005   feeding supplement (BOOST / RESOURCE BREEZE) liquid 1 Container  1 Container Oral TID BM Alfonse Angle, Benuel Brazier, MD       fluconazole  (DIFLUCAN ) IVPB 200 mg  200 mg Intravenous Q24H Kale, Gautam Kishore, MD       Gerhardt's butt cream   Topical BID Lama, Gagan S, MD   1 Application at 08/30/23 0935   heparin  injection 5,000 Units  5,000 Units Subcutaneous Q8H Kakrakandy, Arshad N, MD   5,000 Units at 08/30/23 0658   midodrine (PROAMATINE) tablet  10 mg  10 mg Oral TID WC Chucky Craver, MD   5 mg at 08/29/23 1830   mupirocin  ointment (BACTROBAN ) 2 % 1 Application  1 Application Nasal BID Ozell Blunt, MD   1 Application at 08/30/23 0865   Oral care mouth rinse  15 mL Mouth Rinse PRN Ozell Blunt, MD       phenol (CHLORASEPTIC) mouth spray 1 spray  1 spray Mouth/Throat PRN Oral Billings, MD       polyethylene glycol (MIRALAX  / GLYCOLAX ) packet 17 g  17 g Oral Daily PRN Angelene Kelly, MD       senna-docusate (Senokot-S) tablet 2 tablet  2 tablet Oral QHS Angelene Kelly, MD       sodium bicarbonate  150 mEq in dextrose  5 % 1,150 mL infusion   Intravenous Continuous Chucky Craver, MD 75 mL/hr at 08/30/23 0850 Infusion Verify at 08/30/23 0850    Allergies as of 08/28/2023 - Review Complete 08/28/2023  Allergen Reaction Noted   Nexium [esomeprazole magnesium] Other (See Comments) 02/22/2013   Peanut  butter flavoring agent (non-screening) Itching 06/14/2023    Family History  Problem Relation Age of Onset   Leukemia Mother    Hypertension Father    Diabetes Sister    Heart disease Brother    Diabetes Brother    Diabetes Brother    Hypertension Son    Heart disease Son     Social History   Socioeconomic History   Marital status: Widowed    Spouse name: Not on file   Number of children: 3   Years of education: Not on file   Highest education level: Not on file  Occupational History   Occupation: retired  Tobacco Use   Smoking status: Never   Smokeless tobacco: Never  Vaping Use   Vaping status: Never Used  Substance and Sexual Activity   Alcohol use: No   Drug use: Never   Sexual activity: Not on file  Other Topics Concern   Not on file  Social History Narrative   Not on file   Social Drivers of Health   Financial Resource Strain: Not on file  Food Insecurity: No Food Insecurity (08/29/2023)   Hunger Vital Sign    Worried About Running Out of Food in the Last Year: Never true    Ran Out of Food in the Last Year: Never true  Transportation Needs: No Transportation Needs (08/29/2023)   PRAPARE - Administrator, Civil Service (Medical): No    Lack of Transportation (Non-Medical): No  Physical Activity: Not on file  Stress: Not on file  Social Connections: Moderately Integrated (08/29/2023)   Social Connection and Isolation Panel [NHANES]    Frequency of Communication with Friends and Family: More than three times a week    Frequency of Social Gatherings with Friends and Family: Three times a week    Attends Religious Services: More than 4 times per year    Active Member of Clubs or Organizations: Yes    Attends Banker Meetings: More than 4 times per year    Marital Status: Widowed  Intimate Partner Violence: Not At Risk (08/29/2023)   Humiliation, Afraid, Rape, and Kick questionnaire    Fear of Current or Ex-Partner: No    Emotionally  Abused: No    Physically Abused: No    Sexually Abused: No   Review of Systems: Gen: + Weight loss. CV: Denies chest pain, palpitations or edema. Resp: Denies  cough, shortness of breath of hemoptysis.  GI: See HPI. GU : Denies urinary burning, blood in urine, increased urinary frequency or incontinence. MS: Denies joint pain, muscles aches or weakness. Derm: Denies rash, itchiness, skin lesions or unhealing ulcers. Psych: Denies depression, anxiety, memory loss or confusion. Heme: Denies easy bruising, bleeding. Neuro:  Denies headaches, dizziness or paresthesias. Endo:  Denies any problems with DM, thyroid  or adrenal function.  Physical Exam: Vital signs in last 24 hours: Temp:  [97.6 F (36.4 C)-98.2 F (36.8 C)] 98.2 F (36.8 C) (05/28 0800) Pulse Rate:  [69-77] 74 (05/28 1003) Resp:  [13-23] 18 (05/28 1003) BP: (99-119)/(35-50) 112/40 (05/28 1000) SpO2:  [94 %-98 %] 95 % (05/28 1003) Weight:  [65 kg-67 kg] 67 kg (05/28 0500) Last BM Date : 08/28/23 General: Ill fatigued appearing 83 year old female in no acute distress. Head:  Normocephalic and atraumatic. Eyes:  No scleral icterus. Conjunctiva pink. Ears:  Normal auditory acuity. Nose:  No deformity, discharge or lesions. Mouth:  Dentition intact.  Scattered white coating on tongue. Neck:  Supple. No lymphadenopathy or thyromegaly.  Lungs: Breath sounds clear throughout. No wheezes, rhonchi or crackles.  Heart: Regular rate and rhythm, no murmurs. Abdomen: Early distended, nontender.  Component of of anasarca to the abdominal wall.  Hypoactive bowel sounds all 4 quadrants. Rectal: Deferred. Musculoskeletal:  Symmetrical without gross deformities.  Pulses:  Normal pulses noted. Extremities: Moderate bilateral lower extremity edema. Neurologic: Alert and oriented x 3.  Speech is softly spoken but clear.  Moves all extremities. Skin:  Intact without significant lesions or rashes. Psych:  Alert and cooperative. Normal  mood and affect.  Intake/Output from previous day: 05/27 0701 - 05/28 0700 In: 1802 [P.O.:290; I.V.:1412; IV Piggyback:100] Out: 1070 [Urine:1050; Emesis/NG output:20] Intake/Output this shift: Total I/O In: 211.9 [I.V.:211.9] Out: -   Lab Results: Recent Labs    08/28/23 1518 08/29/23 0424 08/30/23 0539  WBC 39.6* 25.2* 13.8*  HGB 9.8* 8.4* 8.0*  HCT 32.3* 26.5* 25.6*  PLT 197 163 129*   BMET Recent Labs    08/28/23 1518 08/29/23 0424 08/30/23 0539  NA 136 133* 139  K 5.3* 4.6 4.4  CL 104 100 100  CO2 15* 19* 23  GLUCOSE 117* 126* 131*  BUN 132* 126* 115*  CREATININE 4.23* 4.11* 3.94*  CALCIUM 8.3* 7.1* 6.7*   LFT Recent Labs    08/29/23 0424 08/30/23 0539  PROT 5.1* 4.8*  ALBUMIN  2.3* 2.2*  AST 21 21  ALT 17 13  ALKPHOS 92 84  BILITOT 0.6 0.8  BILIDIR 0.2  --   IBILI 0.4  --    PT/INR No results for input(s): "LABPROT", "INR" in the last 72 hours. Hepatitis Panel No results for input(s): "HEPBSAG", "HCVAB", "HEPAIGM", "HEPBIGM" in the last 72 hours.    Studies/Results: CT ABDOMEN PELVIS WO CONTRAST Result Date: 08/28/2023 CLINICAL DATA:  Failure to thrive. 1st chemotherapy this week. Known lymphoma. * Tracking Code: BO * EXAM: CT ABDOMEN AND PELVIS WITHOUT CONTRAST TECHNIQUE: Multidetector CT imaging of the abdomen and pelvis was performed following the standard protocol without IV contrast. RADIATION DOSE REDUCTION: This exam was performed according to the departmental dose-optimization program which includes automated exposure control, adjustment of the mA and/or kV according to patient size and/or use of iterative reconstruction technique. COMPARISON:  08/09/2023 FINDINGS: Lower chest: Initial thickening at the lung bases likely due to subsegmental atelectasis and/or air trapping. Normal heart size without pericardial or pleural effusion. Hepatobiliary: Normal noncontrast appearance of the  liver, gallbladder. No gross intrahepatic biliary duct  dilatation. Pancreas: Normal, without mass or ductal dilatation. Spleen: Normal in size, without focal abnormality. Adrenals/Urinary Tract: Normal left adrenal gland. No left renal calculi. Mild left-sided caliectasis is unchanged. The dominant mass centered in the right abdominal retroperitoneum is amorphous, ill-defined and difficult to on this noncontrast exam. Estimated at 12.6 by 9.6 cm on 38/2, relatively similar to 08/09/2023. This is encompasses and obscures the right adrenal gland right kidney. Amorphous soft tissue extends into the left-sided abdominal retroperitoneum including on 43/2, relatively similar. Soft tissue thickening is most apparent about the posterior bladder wall with suggestion of soft tissue fullness about the right bladder base including on 73/2. This is less masslike than on the prior exam. Stomach/Bowel: Tiny hiatal hernia. No gastric outlet obstruction. Normal colon and terminal ileum. Normal small bowel caliber. Vascular/Lymphatic: Aortic atherosclerosis. Prominent but not pathologically sized abdominal retroperitoneal nodes. Reproductive: Hysterectomy.  No adnexal mass. Other: Pelvic floor laxity. Small volume perihepatic ascites, decreased. Musculoskeletal: No acute osseous abnormality. IMPRESSION: 1. Limited exam secondary to lack of oral or IV contrast. 2. Grossly similar appearance of amorphous, ill-defined soft tissue density mass within the abdominopelvic right-sided retroperitoneum. Extension or adjacent presumed lymphoma within the left-sided abdominal retroperitoneum. 3. No gross bowel obstruction or other acute complication. 4. Similar mild left-sided caliectasis, likely due to left abdominal retroperitoneal lymphoma. 5. Soft tissue thickening about the posterior bladder wall with decreased soft tissue fullness about the right bladder base. Suspicious for lymphomatous involvement. 6. Decrease in small volume perihepatic ascites 7. Incidental findings, including: Aortic  Atherosclerosis (ICD10-I70.0). Tiny hiatal hernia. Electronically Signed   By: Lore Rode M.D.   On: 08/28/2023 16:51   DG Chest Portable 1 View Result Date: 08/28/2023 CLINICAL DATA:  Weakness, lightheadedness, dizziness, history of lymphoma EXAM: PORTABLE CHEST 1 VIEW COMPARISON:  08/15/2023 FINDINGS: Single frontal view of the chest demonstrates right chest wall port via internal jugular approach, tip overlying superior vena cava. Cardiac silhouette is unremarkable. No acute airspace disease, effusion, or pneumothorax. No acute bony abnormalities. IMPRESSION: 1. No acute intrathoracic process. Electronically Signed   By: Bobbye Burrow M.D.   On: 08/28/2023 15:31    IMPRESSION/PLAN:  83 year old female with N/V and epigastric pain x 3 months.  Patient endorses swallowing food without any difficulty but food sits in the lower esophagus/top of her stomach which results in vomiting white foam emesis multiple times daily. Outpatient barium swallow study 5/16 showed a small hiatal hernia, the barium tablet became stuck above the GE junction and tapering stricture was visualized and mild to moderate esophageal dysmotility with tertiary contractions consistent with presbyesophagus. Her right retroperitoneal mass may be somewhat contributing to her GI symptoms. On Diflucan  IV for suspected esophageal candidiasis. - EGD with possible esophageal dilatation tomorrow with Dr. Venice Gillis, benefits and risks discussed with the patient and son, including risk with sedation, risk of bleeding, perforation and infection - Continue dysphagia 2 diet as tolerated - NPO after midnight  - Ondansetron  4 mg p.o. or IV every 6 hours as needed - Pantoprazole  40 mg p.o. daily as tolerated (patient had headaches when on Nexium in the past)  Newly diagnosed high-grade large B-cell lymphoma presenting as right perinephric mass. Started on  R-miniCHOP, status post cycle 1 on 08/23/23. CTAP without contrast 5/26 showed a similar  appearance of the ill-defined soft tissue density mass within the abdominal pelvic right sided retroperitoneum with extension versus adjacent presumed lymphoma within the left-sided abdominal retroperitoneum, left-sided  caliectasis likely due to left abdominal retroperitoneal lymphoma, soft tissue thickening about the posterior bladder wall with decreased soft tissue fullness to the right bladder base suspicious for lymphomatous involvement, decreased in perihepatic ascites  - Oncology following  History of CLL/SLL with splenomegaly diagnosed December 2021   Autoimmune hemolytic anemia with splenomegaly. Hg 8.4 -> 8.0. No overt GI bleeding. -CBC in am -Defer transfusion recommendations to oncology   AKI on CKD stage III -Renal consult in process  Chronic hepatis B. Hep B surface antigen positive, Hep B core total antibody positive, Hep B surface antibody negative and Hep B DNA 110 on 08/18/2023.  Normal LFTs. Evaluated by ID, started on Entecavir. -Hepatic panel and BMP in am  Thrombocytopenia.  Platelet count 129.  Personal history of adenomatous polyps.  Colonoscopy 05/2017 by Dr. Dany Dyke, no polyps.    Tory Freiberg  08/30/2023, 12:49 PM   Attending physician's note   I have taken history, reviewed the chart and examined the patient. I performed a substantive portion of this encounter, including complete performance of at least one of the key components, in conjunction with the APP. I agree with the Advanced Practitioner's note, impression and recommendations.   Dysphagia with Ba swallow showing distal eso stricture/dysmotility and presbyesophagus. Has been on nystatin /Diflucan  for suspected eso candidiasis.  Large infiltrative R/P mass (13 cm x 10 cm) Dx as high grade B-cell lymphoma. S/P RCHOP (1st dose 5/21). No bowel obstruction but significant mass effect.  AKI on CKD with R sided hydronephrosis.  Chronic Hep B on Entecavir (to prevent activation on chemo)  AIHA with  splenomegaly, prev CLL. Plts 130K  Plan: -IV Protonix -EGD with possible balloon dil/Bx in AM -Also being evaluated for IR for nephrostomy tube placement- This should take precedence over EGD in case of any scheduling conflict.  D/W pt and son [name] in detail including risks and benefits.  They do understand higher than baseline risks including risks of perforation, bleeding, aspiration.  Benefits were also discussed.  They wish to proceed.   Magnus Schuller, MD [facility] [phone]

## 2023-08-30 NOTE — Hospital Course (Signed)
 83 y.o. 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  Denies any nausea vomiting diarrhea fever chills chest pain or shortness of breath.  Has abdominal fullness.  Due to persistent weakness patient was brought to the ER by the patient's family.   Dr. Salomon Cree who advised starting patient on rasburicase  for possible tumor lysis syndrome and also ER physician discussed with Dr. Edson Graces who advised starting patient on bicarbonate infusion.  CT abdomen pelvis does not show anything acute but shows chronic changes.

## 2023-08-30 NOTE — Progress Notes (Addendum)
 [name]  Subjective:  Pt seen in ICU Pt is responsive and Ox 3  I/O yest 2.3 L in and 670 cc UOP Creat down slightly to 3.9, BUN down to 115 Alb 2.2  Vitals:   08/30/23 1100 08/30/23 1200 08/30/23 1300 08/30/23 1400  BP: (!) 105/39 (!) 107/40 (!) 103/41 (!) 103/44  Pulse: 72 73 71 72  Resp: (!) 24 16 (!) 21 17  Temp:  98 F (36.7 C)    TempSrc:  Oral    SpO2: 94% 96% 95% 95%  Weight:      Height:        Exam: Gen elderly, frail female, responding today, Ox 3 No jvd or bruits Chest clear bilat to bases RRR no MRG Abd soft protuberant, non-tender GU foley in place MS no joint effusions or deformity Ext diffuse 1-2+ bilat LE edema Neuro as above       Renal-related home meds: Demadex  20 mg daily as needed Losartan  50 mg daily Norvasc  5 mg daily Nadolol  20 mg daily Others: Ultram , Compazine , Zofran , Prilosec, vitamins, allopurinol    Date                             Creat               eGFR (ml/min) 2012- 2016                  0.98- 1.06 2017- 2020                  1.06- 1.35         2021- 2022                  1.07- 1.35 2023                            1.11-  1.43 2024                            1.19- 1.58 Mar 2025                     1.27- 1.61 April 2025                    1.56- 2.42 5/01- 08/09/23               2.03- 2.34        20-24 ml/min 08/15/23                        2.51 08/18/22                        2.91 5/26                             4.23 08/29/23                        4.11   UA 5/26 - prot neg, 0-5 rbc, 11-20 wbc UNa 13, UCr 69 CT 08/28/23 - showed she dominant mass centered in the right abdominal retroperitoneum is amorphous, ill-defined and difficult to on this noncontrast exam.. estimated at 12.6 by 9.6 cm on 38/2, relatively similar to 08/09/2023... this mass  encompasses and obscures the right adrenal gland and the right kidney. Amorphous soft tissue extends into the left-sided abdominal retroperitoneum.     Assessment/ Plan: AKI on CKD 4: b/l creatinine 2.0- 2.3 from early May 2025, eGFR 20-25 ml/min.  Creat here was 4.2 on admission in the setting of recent starting of chemotherapy w/ high uric acid and suspected TLS. Pt was rx'd w/ rasburicase and IVF's. Foley cath in place. BP's have been soft but not low. UA unremarkable, urine lytes c/w prerenal. CT imaging showed large R sided soft-tissue mass which encompasses and obscures the view of the R kidney. 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 Suspect AKI is d/t R hydronephrosis +/- tumor lysis syndrome, +/- hypotension. Midodrine was ^d to 10mg  tid. IVF"s cont'd at 75 cc/hr. UOP improving. Will consult IR for consideration of R percutaneous nephrostomy. Cont IVF"s at 75 cc/hr.  R-sided hydronephrosis: Urology was this pt in late April (08/02/23). They felt the hydro was secondary to extrinsic compression from the soft tissue density in the R renal fossa. They did not recommend stenting due to high failure rates; they did say that PCN should be tried if renal decompression is necessary. Will consult IR as above.  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 No resp issues. 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 Plan is R-mini CHOP therapy, 1st dose was 5/21. Per oncology.  CLL: dx'd in 2021.        Larry Poag MD  [facility] 08/30/2023, 2:46 PM  Recent Labs  Lab 08/28/23 1518 08/29/23 0424 08/30/23 0539  HGB 9.8* 8.4* 8.0*  ALBUMIN 2.9* 2.3* 2.2*  CALCIUM 8.3* 7.1* 6.7*  PHOS 10.3* 9.1*  --   CREATININE 4.23* 4.11* 3.94*  K 5.3* 4.6 4.4   No results for input(s): "IRON", "TIBC", "FERRITIN" in the last 168 hours. Inpatient medications:  Chlorhexidine Gluconate Cloth  6 each Topical Daily   entecavir  0.5 mg Oral Q Wed   feeding supplement  1 Container Oral TID BM   Gerhardt's butt cream    Topical BID   heparin   5,000 Units Subcutaneous Q8H   midodrine  10 mg Oral TID WC   mupirocin ointment  1 Application Nasal BID   pantoprazole (PROTONIX) IV  40 mg Intravenous Q24H   senna-docusate  2 tablet Oral QHS    fluconazole  (DIFLUCAN ) IV     lactated ringers  75 mL/hr at 08/30/23 1442   mouth rinse, phenol, polyethylene glycol

## 2023-08-30 NOTE — Progress Notes (Cosign Needed)
 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  She is on active treatment with R-miniCHOP, status post cycle 1 on 08/23/23.  She was admitted 08/28/2023 due to complaints of generalized weakness, dysphagia, nausea vomiting and failure to thrive.   CLL/SLL  CD20+ CD5+ve lymphoproliferative disorder  Leukocytosis - Diagnosed 2021 when she presented with hemolytic anemia and splenomegaly. - Status post Imbruvica  started in 2022. - Patient seen in outpatient oncology office on 08/16/2023: Treatment options discussed vs hospice care.  Patient opted for R-mini CHOP received 5/21. - Cycle 2-day 1 due 6/11, chemo remains on HOLD.  - Medical oncology/Dr. 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   -- Recommends entecavir as less impact on kidney function.  Okay to start Rituximab in 1 week. Recommendation is usually 1-2 wk of hep b treatment before starting rituxan.   -- Entecavir initiation 08/30/23   History of autoimmune hemolytic anemia - Has been monitored closely - Hemoglobin 8.0 today - Recommend PRBC transfusion for Hgb <7.0 - Continue to monitor CBC with differential   Fatigue Generalized weakness Nausea/vomiting Failure to thrive - Continue supportive care - Continue IV antiemetics - Consideration for nutrition eval  Dysphagia -- patient is having significant dysphagia.  Previously seen by GI outpatient, she was rx for esophageal candidiasis but also had possible dysmotility vs stricture that might need dilatation. -- Recommend GI consult. -  Appreciate swallow eval    Thrombocytopenia -- mild -- Platelets dropping, 120k today -- No intervention required at this time -- Continue to monitor CBC with diff   Code Status Full  Subjective:  Patient seen awake alert laying in bed.  Appears very weak.  Reports that she still feels a little nauseous with no vomiting.  Denies pain, admits to fatigue.  No other acute distress is noted  Objective:   Intake/Output Summary (Last 24 hours) at 08/30/2023 0954 Last data filed at 08/30/2023 0850 Gross per 24 hour  Intake 1963.92 ml  Output 1070 ml  Net 893.92 ml     PHYSICAL EXAMINATION: ECOG PERFORMANCE STATUS: 4 - Bedbound  Vitals:   08/30/23 0800 08/30/23 0900  BP: (!) 110/35 (!) 105/38  Pulse: 70 72  Resp: 16 18  Temp: 98.2 F (36.8 C)   SpO2: 95% 95%   Filed Weights   08/28/23 2142 08/29/23 1607 08/30/23 0500  Weight: 140 lb 3.4 oz (63.6 kg) 143 lb 4.8 oz (65 kg) 147 lb 11.3 oz (67 kg)    GENERAL: alert, +chronically ill-appearing SKIN: skin color, texture, turgor are normal, no rashes or significant lesions EYES: normal, conjunctiva are pink and non-injected, sclera clear OROPHARYNX: no exudate, no erythema and lips, buccal mucosa, and tongue normal  NECK: supple, thyroid  normal size, non-tender, without nodularity LYMPH: no palpable lymphadenopathy in the cervical, axillary or inguinal LUNGS: clear to auscultation and percussion with normal breathing effort HEART: regular rate & rhythm and no murmurs and no lower extremity edema ABDOMEN: abdomen soft, non-tender and normal bowel sounds MUSCULOSKELETAL: no cyanosis of digits and no clubbing  PSYCH:  alert & oriented x 3 with fluent speech NEURO: no focal motor/sensory deficits   All questions were answered. The patient knows to call the clinic with any problems, questions or concerns.   The total time spent in the appointment was 40 minutes encounter with patient including review of chart and various tests  results, discussions about plan of care and coordination of care plan  Jacqueline Mate, NP 08/30/2023 9:54 AM    Labs Reviewed:  Lab Results  Component Value Date   WBC 13.8 (H) 08/30/2023   HGB 8.0 (L) 08/30/2023   HCT 25.6 (L) 08/30/2023   MCV 88.0 08/30/2023   PLT 129 (L) 08/30/2023   Recent Labs    08/28/23 1518 08/29/23 0424 08/30/23 0539  NA 136 133* 139  K 5.3* 4.6 4.4  CL 104 100 100  CO2 15* 19* 23  GLUCOSE 117* 126* 131*  BUN 132* 126* 115*  CREATININE 4.23* 4.11* 3.94*  CALCIUM 8.3* 7.1* 6.7*  GFRNONAA 10* 10* 11*  PROT 6.1* 5.1* 4.8*  ALBUMIN 2.9* 2.3* 2.2*  AST 24 21 21   ALT 21 17 13   ALKPHOS 123 92 84  BILITOT 0.5 0.6 0.8  BILIDIR  --  0.2  --   IBILI  --  0.4  --     Studies Reviewed:  CT ABDOMEN PELVIS WO CONTRAST Result Date: 08/28/2023 CLINICAL DATA:  Failure to thrive. 1st chemotherapy this week. Known lymphoma. * Tracking Code: BO * EXAM: CT ABDOMEN AND PELVIS WITHOUT CONTRAST TECHNIQUE: Multidetector CT imaging of the abdomen and pelvis was performed following the standard protocol without IV contrast. RADIATION DOSE REDUCTION: This exam was performed according to the departmental dose-optimization program which includes automated exposure control, adjustment of the mA and/or kV according to patient size and/or use of iterative reconstruction technique. COMPARISON:  08/09/2023 FINDINGS: Lower chest: Initial thickening at the lung bases likely due to subsegmental atelectasis and/or air trapping. Normal heart size without pericardial or pleural effusion. Hepatobiliary: Normal noncontrast appearance of the liver, gallbladder. No gross intrahepatic biliary duct dilatation. Pancreas: Normal, without mass or ductal dilatation. Spleen: Normal in size, without focal abnormality. Adrenals/Urinary Tract: Normal left adrenal gland. No left renal calculi. Mild left-sided caliectasis is unchanged. The dominant mass centered in the right abdominal retroperitoneum is  amorphous, ill-defined and difficult to on this noncontrast exam. Estimated at 12.6 by 9.6 cm on 38/2, relatively similar to 08/09/2023. This is encompasses and obscures the right adrenal gland right kidney. Amorphous soft tissue extends into the left-sided abdominal retroperitoneum including on 43/2, relatively similar. Soft tissue thickening is most apparent about the posterior bladder wall with suggestion of soft tissue fullness about the right bladder base including on 73/2. This is less masslike than on the prior exam. Stomach/Bowel: Tiny hiatal hernia. No gastric outlet obstruction. Normal colon and terminal ileum. Normal small bowel caliber. Vascular/Lymphatic: Aortic atherosclerosis. Prominent but not pathologically sized abdominal retroperitoneal nodes. Reproductive: Hysterectomy.  No adnexal mass. Other: Pelvic floor laxity. Small volume perihepatic ascites, decreased. Musculoskeletal: No acute osseous abnormality. IMPRESSION: 1. Limited exam secondary to lack of oral or IV contrast. 2. Grossly similar appearance of amorphous, ill-defined soft tissue density mass within the abdominopelvic right-sided retroperitoneum. Extension or adjacent presumed lymphoma within the left-sided abdominal retroperitoneum. 3. No gross bowel obstruction or other acute complication. 4. Similar mild left-sided caliectasis, likely due to left abdominal retroperitoneal lymphoma. 5. Soft tissue thickening about the posterior bladder wall with decreased soft tissue fullness about the right bladder base. Suspicious  for lymphomatous involvement. 6. Decrease in small volume perihepatic ascites 7. Incidental findings, including: Aortic Atherosclerosis (ICD10-I70.0). Tiny hiatal hernia. Electronically Signed   By: Lore Rode M.D.   On: 08/28/2023 16:51   DG Chest Portable 1 View Result Date: 08/28/2023 CLINICAL DATA:  Weakness, lightheadedness, dizziness, history of lymphoma EXAM: PORTABLE CHEST 1 VIEW COMPARISON:  08/15/2023  FINDINGS: Single frontal view of the chest demonstrates right chest wall port via internal jugular approach, tip overlying superior vena cava. Cardiac silhouette is unremarkable. No acute airspace disease, effusion, or pneumothorax. No acute bony abnormalities. IMPRESSION: 1. No acute intrathoracic process. Electronically Signed   By: Bobbye Burrow M.D.   On: 08/28/2023 15:31   ECHOCARDIOGRAM COMPLETE Result Date: 08/22/2023    ECHOCARDIOGRAM REPORT   Patient Name:   Jacqueline Hancock Date of Exam: 08/22/2023 Medical Rec #:  [id number]   Height:       59.0 in Accession #:    [id number]  Weight:       140.0 lb Date of Birth:  1940-12-07   BSA:          1.585 m Patient Age:    83 years    BP:           114/63 mmHg Patient Gender: F           HR:           78 bpm. Exam Location:  Inpatient Procedure: 2D Echo, Color Doppler and Cardiac Doppler (Both Spectral and Color            Flow Doppler were utilized during procedure). Indications:    Chemo  History:        Patient has prior history of Echocardiogram examinations, most                 recent 03/22/2023.  Sonographer:    Andrena Bang Referring Phys: [id number] Jacqueline Hancock  Sonographer Comments: Known apical displaced papillary muscle in LV. IMPRESSIONS  1. Left ventricular ejection fraction, by estimation, is 60 to 65%. The left ventricle has normal function. The left ventricle has no regional wall motion abnormalities. Left ventricular diastolic parameters are consistent with Grade I diastolic dysfunction (impaired relaxation). The average left ventricular global longitudinal strain is -17.5 %. The global longitudinal strain is normal.  2. Right ventricular systolic function is normal. The right ventricular size is normal.  3. The mitral valve is normal in structure. Mild to moderate mitral valve regurgitation. No evidence of mitral stenosis.  4. The aortic valve is grossly normal. Aortic valve regurgitation is trivial. No aortic stenosis is present.  5. The  inferior vena cava is normal in size with greater than 50% respiratory variability, suggesting right atrial pressure of 3 mmHg. Comparison(s): No significant change from prior study. Prior images reviewed side by side. FINDINGS  Left Ventricle: Left ventricular ejection fraction, by estimation, is 60 to 65%. The left ventricle has normal function. The left ventricle has no regional wall motion abnormalities. The average left ventricular global longitudinal strain is -17.5 %. Strain was performed and the global longitudinal strain is normal. The left ventricular internal cavity size was normal in size. There is no left ventricular hypertrophy. Left ventricular diastolic parameters are consistent with Grade I diastolic dysfunction (impaired relaxation). Normal left ventricular filling pressure. Right Ventricle: The right ventricular size is normal. No increase in right ventricular wall thickness. Right ventricular systolic function is normal. Left Atrium: Left atrial size was normal in size. Right Atrium:  Right atrial size was normal in size. Pericardium: There is no evidence of pericardial effusion. Mitral Valve: The mitral valve is normal in structure. Mild to moderate mitral valve regurgitation, with centrally-directed jet. No evidence of mitral valve stenosis. Tricuspid Valve: The tricuspid valve is normal in structure. Tricuspid valve regurgitation is mild . No evidence of tricuspid stenosis. Aortic Valve: The aortic valve is grossly normal. Aortic valve regurgitation is trivial. No aortic stenosis is present. Aortic valve mean gradient measures 4.0 mmHg. Aortic valve peak gradient measures 10.9 mmHg. Aortic valve area, by VTI measures 1.86 cm. Pulmonic Valve: The pulmonic valve was normal in structure. Pulmonic valve regurgitation is not visualized. No evidence of pulmonic stenosis. Aorta: The aortic Hancock is normal in size and structure. Venous: The inferior vena cava is normal in size with greater than 50%  respiratory variability, suggesting right atrial pressure of 3 mmHg. IAS/Shunts: No atrial level shunt detected by color flow Doppler.  LEFT VENTRICLE PLAX 2D LVIDd:         3.90 cm     Diastology LVIDs:         2.20 cm     LV e' medial:    8.49 cm/s LV PW:         1.00 cm     LV E/e' medial:  7.9 LV IVS:        0.90 cm     LV e' lateral:   10.60 cm/s LVOT diam:     1.70 cm     LV E/e' lateral: 6.3 LV SV:         46 LV SV Index:   29          2D Longitudinal Strain LVOT Area:     2.27 cm    2D Strain GLS Avg:     -17.5 %  LV Volumes (MOD) LV vol d, MOD A2C: 49.6 ml LV vol d, MOD A4C: 54.7 ml LV vol s, MOD A2C: 16.9 ml LV vol s, MOD A4C: 24.9 ml LV SV MOD A2C:     32.7 ml LV SV MOD A4C:     54.7 ml LV SV MOD BP:      32.7 ml RIGHT VENTRICLE RV S prime:     16.90 cm/s TAPSE (M-mode): 2.0 cm LEFT ATRIUM             Index LA diam:        3.10 cm 1.96 cm/m LA Vol (A2C):   22.3 ml 14.07 ml/m LA Vol (A4C):   24.7 ml 15.58 ml/m LA Biplane Vol: 24.1 ml 15.21 ml/m  AORTIC VALVE AV Area (Vmax):    1.69 cm AV Area (Vmean):   1.85 cm AV Area (VTI):     1.86 cm AV Vmax:           165.00 cm/s AV Vmean:          86.200 cm/s AV VTI:            0.247 m AV Peak Grad:      10.9 mmHg AV Mean Grad:      4.0 mmHg LVOT Vmax:         123.00 cm/s LVOT Vmean:        70.300 cm/s LVOT VTI:          0.202 m LVOT/AV VTI ratio: 0.82 MITRAL VALVE               TRICUSPID VALVE MV Area (PHT): 4.33 cm  TR Peak grad:   33.4 mmHg MV Decel Time: 175 msec    TR Vmax:        289.00 cm/s MR Peak grad: 147.4 mmHg MR Mean grad: 111.0 mmHg   SHUNTS MR Vmax:      607.00 cm/s  Systemic VTI:  0.20 m MR Vmean:     500.0 cm/s   Systemic Diam: 1.70 cm MV E velocity: 66.70 cm/s MV A velocity: 99.00 cm/s MV E/A ratio:  0.67 Jacqueline Croitoru MD Electronically signed by Jacqueline Rumple MD Signature Date/Time: 08/22/2023/2:33:18 PM    Final    IR IMAGING GUIDED PORT INSERTION Result Date: 08/21/2023 CLINICAL DATA:  B-CELL LYMPHOMA, ACCESS FOR CHEMOTHERAPY EXAM:  RIGHT INTERNAL JUGULAR SINGLE LUMEN POWER PORT CATHETER INSERTION Date:  08/21/2023 08/21/2023 9:33 am Radiologist:  Jacqueline Stable. Alyssa Jumper, MD Guidance:  Ultrasound and fluoroscopic MEDICATIONS: 1% lidocaine  local with epinephrine  ANESTHESIA/SEDATION: Versed  1.0 mg IV; Fentanyl  50 mcg IV; Moderate Sedation Time:  28 minute The patient was continuously monitored during the procedure by the interventional radiology nurse under my direct supervision. FLUOROSCOPY: One minutes, 24 seconds (15 mGy) COMPLICATIONS: None immediate. CONTRAST:  None. PROCEDURE: Informed consent was obtained from the patient following explanation of the procedure, risks, benefits and alternatives. The patient understands, agrees and consents for the procedure. All questions were addressed. A time out was performed. Maximal barrier sterile technique utilized including caps, mask, sterile gowns, sterile gloves, large sterile drape, hand hygiene, and 2% chlorhexidine scrub. Under sterile conditions and local anesthesia, right internal JV micropuncture venous access was performed. Access was performed with ultrasound. Images were obtained for documentation of the patent right internal jugular vein. A guide wire was inserted followed by a transitional dilator. This allowed insertion of a guide wire and catheter into the IVC. Measurements were obtained from the SVC / RA junction back to the right IJ venotomy site. In the right infraclavicular chest, a subcutaneous pocket was created over the second anterior rib. This was done under sterile conditions and local anesthesia. 1% lidocaine  with epinephrine  was utilized for this. A 2.5 cm incision was made in the skin. Blunt dissection was performed to create a subcutaneous pocket over the right pectoralis major muscle. The pocket was flushed with saline vigorously. There was adequate hemostasis. The port catheter was assembled and checked for leakage. The port catheter was secured in the pocket with two retention  sutures. The tubing was tunneled subcutaneously to the right venotomy site and inserted into the SVC/RA junction through a valved peel-away sheath. Position was confirmed with fluoroscopy. Images were obtained for documentation. The patient tolerated the procedure well. No immediate complications. Incisions were closed in a two layer fashion with 4 - 0 Vicryl suture. Dermabond was applied to the skin. The port catheter was accessed, blood was aspirated followed by saline and heparin  flushes. Needle was removed. A dry sterile dressing was applied. IMPRESSION: Ultrasound and fluoroscopically guided right internal jugular single lumen power port catheter insertion. Tip in the SVC/RA junction. Catheter ready for use. Electronically Signed   By: Jacqueline Stable.  Shick M.D.   On: 08/21/2023 09:57   DG ESOPHAGUS W SINGLE CM (SOL OR THIN BA) Addendum Date: 08/19/2023 ADDENDUM REPORT: 08/19/2023 08:35 ADDENDUM: Addendum for clarification of body of report. Hiatus hernia without evidence of stricture. Electronically Signed   By: Jacqueline Largo M.D.   On: 08/19/2023 08:35   Result Date: 08/19/2023 CLINICAL DATA:  dysphagia 83 year old female with dysphagia and globus sensation for esophagram. EXAM: ESOPHAGUS/BARIUM  SWALLOW/TABLET STUDY TECHNIQUE: Single contrast examination was performed using thin liquid barium. This exam was performed by Jacqueline C. Emerson, PA-C, and was supervised and interpreted by Dr. Darylene Hancock. FLUOROSCOPY: Radiation Exposure Index and estimated peak skin dose (PSD); Reference air kerma (RAK), 29.4 mGy. Kerma-area product (KAP), 409.9 uGy*m. COMPARISON:  CT AP, 12/02/2022 FINDINGS: Swallowing: Appears normal. No vestibular penetration or aspiration seen. Pharynx: Unremarkable. Esophagus: No mucosal lesions. Smooth, tapering stricture above gastroesophageal junction. Esophageal motility: Moderate esophageal dysmotility visualized with resultant contrast stasis and delayed emptying into the stomach. Hiatal Hernia:  Mild hiatal hernia. Gastroesophageal reflux: None visualized, even with provacative maneuvers. Ingested 13mm barium tablet: Became stuck just above the gastroesophageal junction, where the tapering stricture is visualized. Waited 3 minutes with subsequent swallows of water and barium, tablet remained stuck. Pt informed the tablet will dissolve on its own. Other: None. IMPRESSION: Suboptimal evaluation, secondary to patient's inability to remain upright. 1. Small hiatus hernia. 2. 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 Performed By Jacqueline Root, PA-C and supervised by Jacqueline Largo, MD Electronically Signed: By: Jacqueline Largo M.D. On: 08/18/2023 09:35   DG Chest 2 View Result Date: 08/15/2023 CLINICAL DATA:  Shortness of breath. EXAM: CHEST - 2 VIEW COMPARISON:  Aug 09, 2023. FINDINGS: The heart size and mediastinal contours are within normal limits. Both lungs are clear. The visualized skeletal structures are unremarkable. IMPRESSION: No active cardiopulmonary disease. Electronically Signed   By: Jacqueline Colon M.D.   On: 08/15/2023 11:25   CT ABDOMEN PELVIS WO CONTRAST Result Date: 08/09/2023 CLINICAL DATA:  Abdominal pain no known history of lymphoma and recently positive biopsy EXAM: CT ABDOMEN AND PELVIS WITHOUT CONTRAST TECHNIQUE: Multidetector CT imaging of the abdomen and pelvis was performed following the standard protocol without IV contrast. RADIATION DOSE REDUCTION: This exam was performed according to the departmental dose-optimization program which includes automated exposure control, adjustment of the mA and/or kV according to patient size and/or use of iterative reconstruction technique. COMPARISON:  08/01/2023 FINDINGS: Lower chest: Previously seen patchy ground-glass opacities are again seen but improved when compared with the prior study. Hepatobiliary: Persistent perihepatic fluid is noted. The gallbladder is within normal limits.  Pancreas: Unremarkable. No pancreatic ductal dilatation or surrounding inflammatory changes. Spleen: Normal in size without focal abnormality. Mild perisplenic fluid is noted. Adrenals/Urinary Tract: Left adrenal gland is within normal limits. Left kidney shows no acute abnormality. No obstructive changes are seen.The right kidney is envelop by a large soft tissue mass stable in appearance from the recent exam as well as a recent MRI. 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 The surrounding soft tissues extend inferiorly similar to that seen on the prior exam. The bladder is decompressed. Stomach/Bowel: No obstructive or inflammatory changes of the colon are seen. The appendix is not well visualized. Small bowel and stomach are unremarkable. Vascular/Lymphatic: Aortic atherosclerosis. No enlarged abdominal or pelvic lymph nodes. Reproductive: Uterus has been surgically removed. Other: No abdominal wall hernia or abnormality. No abdominopelvic ascites. Musculoskeletal: No acute bony abnormality is noted. IMPRESSION: Stable appearing soft tissue mass lesion in the right abdomen envelop ping the right kidney with mild hydronephrosis as well as extending superiorly towards the liver and inferiorly into the pelvis. 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 Mild free fluid is noted within the abdomen stable from the prior study. No new focal abnormality is seen. Electronically Signed   By: Jacqueline Hancock Powers  Lukens M.D.   On: 08/09/2023 22:51   DG Chest 2 View Result Date: 08/09/2023 CLINICAL DATA:  Shortness of breath EXAM: CHEST - 2 VIEW COMPARISON:  08/01/2023 FINDINGS: Heart mediastinal contours within normal limits. Questionable ill-defined patchy lower lobe opacities. No visible effusions or pneumothorax. No acute bony abnormality. IMPRESSION: Questionable vague lower lobe opacities, possible atelectasis or pneumonia.  Electronically Signed   By: Janeece Mechanic M.D.   On: 08/09/2023 21:14   CT ABDOMINAL MASS BIOPSY Result Date: 08/04/2023 INDICATION: Retroperitoneal mass.  Personal history of lymphoma. EXAM: CT-guided core biopsy of right retroperitoneal mass MEDICATIONS: None. ANESTHESIA/SEDATION: Moderate (conscious) sedation was employed during this procedure. A total of Versed  1 mg and Fentanyl  50 mcg was administered intravenously by the radiology nurse. Total intra-service moderate Sedation Time: 17 minutes. The patient's level of consciousness and vital signs were monitored continuously by radiology nursing throughout the procedure under my direct supervision. COMPLICATIONS: None immediate. PROCEDURE: Informed written consent was obtained from the patient after a thorough discussion of the procedural risks, benefits and alternatives. All questions were addressed. Maximal Sterile Barrier Technique was utilized including caps, mask, sterile gowns, sterile gloves, sterile drape, hand hygiene and skin antiseptic. A timeout was performed prior to the initiation of the procedure. CT imaging was performed on the large right retroperitoneal mass was identified. A suitable skin entry site was selected and marked. Local anesthesia was attained by infiltration with 1% lidocaine . A small dermatotomy was made. Under intermittent CT guidance, a 17 gauge introducer needle was carefully advanced into the posterolateral aspect of the mass. Multiple 18 gauge core biopsies were then obtained coaxially using the bio Pince automated biopsy device. Biopsy specimens were placed in saline and delivered to pathology for further analysis. IMPRESSION: CT-guided core biopsy of right retroperitoneal mass. Electronically Signed   By: Jacqueline Hoyer M.D.   On: 08/04/2023 12:57   MR ABDOMEN WO CONTRAST Result Date: 08/02/2023 CLINICAL DATA:  Abnormal soft tissue in the right renal fossa on prior imaging. EXAM: MRI ABDOMEN WITHOUT CONTRAST TECHNIQUE:  Multiplanar multisequence MR imaging was performed without the administration of intravenous contrast. COMPARISON:  CT stone study 08/01/2023. PET-CT 07/17/2023. Abdomen pelvis CT with contrast 06/14/2023. FINDINGS: Lower chest: No acute findings Hepatobiliary: No focal abnormality in the liver on noncontrast imaging. Gallbladder is nondistended with multiple tiny gallstones evident. No intrahepatic or extrahepatic biliary dilation. Pancreas: No focal mass lesion. No dilatation of the main duct. No intraparenchymal cyst. No peripancreatic edema. Spleen:  No splenomegaly. No suspicious focal mass lesion. Adrenals/Urinary Tract: Left adrenal gland unremarkable. Right adrenal gland not discretely visible. Mild fullness left intrarenal collecting system. Extensive abnormal soft tissue is seen in and around the right kidney and involving the right retroperitoneal space. 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 Punctate areas of signal void in the region of the right renal pelvis may be tiny calcified gallstones are foci of collecting system gas. This tissue shows marked diffusion restriction and tracks anteriorly to encase the descending and transverse duodenum and extends into the central abdominal mesentery. Inferior extent in the retroperitoneal space tracks down to the pelvis and right greater than left pelvic sidewall although this region is been incompletely visualized on this abdomen MRI. Lack of intravenous contrast hinders assessment. Stomach/Bowel: Tiny hiatal hernia. Stomach is nondilated. Duodenum is nondilated. No small bowel or colonic dilatation within the visualized abdomen. Vascular/Lymphatic: No abdominal aortic aneurysm. Abnormal soft tissue in the right retroperitoneum generates mass-effect on the  IVC with anterior displacement in the upper abdomen although signal void in the lumen of the IVC suggests that it remains patent. Other: Areas of  free fluid are seen at the inferior tip of the liver and right paracolic gutter. Scattered tiny fluid collections are seen in the retroperitoneal space. Diffuse mesenteric and body wall edema evident. Musculoskeletal: No overtly suspicious marrow signal abnormality. IMPRESSION: 1. Extensive abnormal soft tissue in and around the right kidney and involving the right retroperitoneal space. This tissue tracks anteriorly to encase the descending and transverse duodenum and extends into the central abdominal mesentery. Inferior extent in the retroperitoneal space tracks down to the pelvis and right greater than left pelvic sidewalls although this region has been incompletely visualized on this abdomen MRI. Lack of intravenous contrast hinders assessment. As on the recent PET-CT, given that most of the abnormal tissue shows restricted diffusion, this is likely related to lymphoma given the patient's history although, again, assessment is limited by lack of intravenous contrast material. Tissue sampling would likely prove helpful. 2. 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 Although there does not appear to be a large retroperitoneal fluid collection, underlying component of urine leak is not excluded. Punctate areas of signal void in the region of the right renal pelvis may be tiny calcified stones or foci of collecting system gas. 3. Small volume intraperitoneal free fluid. 4. Cholelithiasis. 5. Tiny hiatal hernia. 6. Diffuse mesenteric and body wall edema. Electronically Signed   By: Donnal Fusi M.D.   On: 08/02/2023 11:42   CT Renal Stone Study Result Date: 08/01/2023 CLINICAL DATA:  Abdominal/flank pain, stone suspected EXAM: CT ABDOMEN AND PELVIS WITHOUT CONTRAST TECHNIQUE: Multidetector CT imaging of the abdomen and pelvis was performed following the standard protocol without IV contrast. RADIATION DOSE REDUCTION: This exam was performed according to  the departmental dose-optimization program which includes automated exposure control, adjustment of the mA and/or kV according to patient size and/or use of iterative reconstruction technique. COMPARISON:  June 14, 2023 FINDINGS: Of note, the lack of intravenous contrast limits evaluation of the solid organ parenchyma and vascularity. Lower chest: Interlobular septal thickening with patchy ground-glass opacities in the lung bases. No pleural effusion. Hepatobiliary: No mass. Decompressed gallbladder without radiopaque stones or wall thickening. No intrahepatic or extrahepatic biliary ductal dilation. Pancreas: No mass or main ductal dilation. No peripancreatic inflammation or fluid collection. None in the a he air Spleen: Normal size. No mass. Adrenals/Urinary Tract: No left adrenal mass. The right adrenal gland is not well visualized. The right renal parenchyma is not well visualized due to the lack of intravenous contrast and large amount of soft tissue attenuation filling the right renal fossa, measuring 13.7 x 9.7 x 17.8 cm (unchanged from the prior PET-CT, but larger than on the prior CT, where it measured 10.5 x 9.9 x 15.9 cm). Moderate right-sided hydronephrosis with abrupt narrowing in the region of the UPJ, likely due to extrinsic compression. The urinary bladder is completely decompressed. Stomach/Bowel: The stomach is decompressed without focal abnormality. No small bowel wall thickening or inflammation. No small bowel obstruction. The appendix was not visualized. No right lower quadrant or pericecal inflammatory changes to suggest acute appendicitis. Vascular/Lymphatic: No aortic aneurysm. Diffuse aortoiliac atherosclerosis. Numerous subcentimeter upper retroperitoneal lymph nodes measuring up to 7 mm, likely reactive. Reproductive: Hysterectomy. No concerning adnexal mass. Other: No pneumoperitoneum. A large amount of right retroperitoneal hemorrhage extending into the pelvis with additional hemorrhage  extending in throughout  the left retroperitoneal space, into the Hancock of the mesentery, and along the left retroperitoneal space. Small volume perihepatic and perisplenic ascites. Musculoskeletal: No acute fracture or destructive lesion. IMPRESSION: 1. Redemonstrated abnormal soft tissue density material filling the right renal fossa, measuring 13.7 x 9.7 x 17.8 cm (unchanged from the prior PET-CT, but larger than the prior CT from March 12, where it measured 10.5 x 9.9 x 5.9 cm). Worsening soft tissue extending throughout the right and left retroperitoneal spaces, into the mesentery, and into the pelvis. Given the appearance on the prior studies, this may represent worsening retroperitoneal hematoma or soft tissue infiltration from possible lymphoma given the patient's history. On the prior PET-CT, the increased radiotracer activity could also be related to underlying collecting system injury in the setting of renal trauma. A multiphase CT of the abdomen and pelvis (with collecting phase imaging), recommended for further characterization. 2. Moderate right-sided hydronephrosis with abrupt narrowing at the UPJ, likely due to extrinsic compression from the perinephric and retroperitoneal soft tissue. 3. Findings in the lung bases, worrisome for either a developing bronchopneumonia, which includes viral etiologies, or early pulmonary edema. Critical Value/emergent results were called by telephone at the time of interpretation on 08/01/2023 at 3:28 pm to provider DAN FLOYD , who verbally acknowledged these results. Electronically Signed   By: Rance Burrows M.D.   On: 08/01/2023 15:53   DG Chest Port 1 View Result Date: 08/01/2023 CLINICAL DATA:  Cough and shortness of breath. EXAM: PORTABLE CHEST 1 VIEW COMPARISON:  07/30/2023 FINDINGS: Single-view of the chest. Lungs are clear except for questionable mild blunting at the right costophrenic angle. Heart size is within normal limits and stable. Trachea is midline.  Degenerative changes at the bilateral AC joints. IMPRESSION: 1. Questionable blunting at the right costophrenic angle. Cannot exclude small right pleural effusion. Otherwise, no acute chest finding. Electronically Signed   By: Elene Griffes M.D.   On: 08/01/2023 14:17   ADDENDUM  .Patient was Personally and independently interviewed, examined and relevant elements of the history of present illness were reviewed in details and an assessment and plan was created. All elements of the patient's history of present illness , assessment and plan were discussed in details with Berlinda Breeze NP. The above documentation reflects our combined findings assessment and plan. Creatinine minimally improved.  Uric acid normalizing.  Seen by GI and plan for EGD.  Continues to be on fluconazole .  Jacquelyn Matt MD MS

## 2023-08-30 NOTE — TOC Initial Note (Signed)
 Transition of Care [facility]) - Initial/Assessment Note    Patient Details  Name: Jacqueline Hancock MRN: [id number] Date of Birth: May 08, 1940  [facility] CM/SW Contact:    Tessie Fila, RN Phone Number: 08/30/2023, 11:07 AM  Clinical Narrative:                 Pt is from, with support of adult children. Pt is not currently receiving any HH services. No concerns of SDOH risks. No DME needs at this time. Pt remains in ICU and not medically stable for discharge. Awaiting SLP evaluation and any other recommendations. TOC continuing to follow.  Expected Discharge Plan: Home/Self Care Barriers to Discharge: Continued Medical Work up   Patient Goals and CMS Choice Patient states their goals for this hospitalization and ongoing recovery are:: To return home CMS Medicare.gov Compare Post Acute Care list provided to:: Other (Comment Required) (NA) Choice offered to / list presented to : NA [place] ownership interest in [facility].provided to:: Parent NA    Expected Discharge Plan and Services In-house Referral: NA Discharge Planning Services: CM Consult Post Acute Care Choice: NA Living arrangements for the past 2 months: Single Family Home                 DME Arranged: N/A DME Agency: NA       HH Arranged: NA HH Agency: NA        Prior Living Arrangements/Services Living arrangements for the past 2 months: Single Family Home Lives with:: Adult Children Patient language and need for interpreter reviewed:: Yes Do you feel safe going back to the place where you live?: Yes      Need for Family Participation in Patient Care: No (Comment) Care giver support system in place?: Yes (comment) Current home services: Other (comment) (NA) Criminal Activity/Legal Involvement Pertinent to Current Situation/Hospitalization: No - Comment as needed  Activities of Daily Living   ADL Screening (condition at time of admission) Independently performs ADLs?: Yes  (appropriate for developmental age) Is the patient deaf or have difficulty hearing?: No Does the patient have difficulty seeing, even when wearing glasses/contacts?: No Does the patient have difficulty concentrating, remembering, or making decisions?: No  Permission Sought/Granted Permission sought to share information with : Family Supports Permission granted to share information with : Yes, Verbal Permission Granted  Share Information with NAME: Ecko, Beasley (Son)  [phone]           Emotional Assessment Appearance:: Other (Comment Required (UTA) Attitude/Demeanor/Rapport: Unable to Assess Affect (typically observed): Unable to Assess Orientation: : Oriented to Self, Oriented to Situation, Oriented to Place Alcohol / Substance Use: Not Applicable Psych Involvement: No (comment)  Admission diagnosis:  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] Generalized weakness [R53.1] AKI (acute kidney injury) (HCC) [N17.9] ARF (acute renal failure) (HCC) [N17.9] Patient Active Problem List   Diagnosis Date Noted   Dysphagia 08/30/2023   Malnutrition of moderate degree 08/29/2023   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   Leucocytosis 08/28/2023   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   Perinephric abdominal mass 08/03/2023   Hyperkalemia 08/03/2023   ARF (acute renal failure) (HCC) 08/01/2023   Hypercalcemia 06/15/2023   Traumatic perinephric hematoma of right side, initial encounter 06/15/2023   ILD (interstitial lung disease) (HCC) 06/15/2023   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   Weakness 06/15/2023   Kidney laceration, right 06/15/2023   Atherosclerosis of aorta (HCC) 02/07/2023   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   Obesity (BMI 30.0-34.9) 08/06/2020   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   Chronic kidney disease, stage 3b (HCC) 09/10/2015   Vitamin D  deficiency 06/20/2013    Medication management 06/20/2013   Hypertension    Hyperlipidemia    Abnormal glucose    GERD (gastroesophageal reflux disease)    Osteopenia    PCP:  Estil Heman, NP Pharmacy:   [facility] [phone] - [place] Nestle, [place] - [address] AT [facility] [address] [place] [address] Phone: [phone] Fax: [phone]     Social Drivers of Health (SDOH) Social History: SDOH Screenings   Food Insecurity: No Food Insecurity (08/29/2023)  Housing: Low Risk  (08/29/2023)  Transportation Needs: No Transportation Needs (08/29/2023)  Utilities: Not At Risk (08/29/2023)  Recent Concern: Utilities - At Risk (08/02/2023)  Depression (PHQ2-9): Low Risk  (07/18/2023)  Social Connections: Moderately Integrated (08/29/2023)  Tobacco Use: Low Risk  (08/28/2023)  Health Literacy: Adequate Health Literacy (02/16/2023)   SDOH Interventions:     Readmission Risk Interventions    08/30/2023   11:03 AM 08/02/2023   11:12 AM  Readmission Risk Prevention Plan  Transportation Screening Complete Complete  PCP or Specialist Appt within 5-7 Days  Complete  Home Care Screening  Complete  Medication Review (RN CM)  Complete  Medication Review (RN Care Manager) Complete   PCP or Specialist appointment within 3-5 days of discharge Complete   HRI or Home Care Consult Complete   SW Recovery Care/Counseling Consult Complete   Palliative Care Screening Complete   Skilled Nursing Facility Complete

## 2023-08-30 NOTE — Consult Note (Addendum)
 Referring Provider: Dr. Cherylle Corwin Primary Care Physician:  Estil Heman, NP Primary Gastroenterologist:  Dr. Yvone Herd  Reason for Consultation: Dysphagia  HPI: Jacqueline Hancock is a 83 y.o. female with a past medical history of hypertension, hyperlipidemia, CLL, recently diagnosed with B cell lymphoma, CKD stage IIIb, osteopenia, GERD and colon polyps.  She was recently admitted to the hospital 08/01/2023 with fatigue and AKI on CKD in setting of a known large right perinephric abdominal mass seen on PET scan 07/17/2023. She underwent a biopsy of the abdominal mass per IR 5/2, path report confirmed B-cell lymphoproliferative disorder. She was discharged home on 08/05/2023. She was evaluated by oncologist Dr. Salomon Cree  08/16/2023 and she was started on chemo (R-mini CHOP) first infusion received 5/21. During her workup, she underwent hepatitis B serologies 08/18/2023 which showed a positive hepatitis B surface antigen, positive hepatitis B core total antibody, negative hepatitis B surface antibody and a low hepatitis B DNA level 110.  Prior labs 03/13/2019 showed a positive hepatitis B surface antigen and positive hep B core antibody. She was referred to ID to treat chronic hepatitis B.  She was seen in our outpatient GI clinic by Santina Cull, PA-C 08/09/2023 for further evaluation regarding dysphagia which had progressively worsened over the past 2 to 3 months. She was prescribed Omeprazole  20mg  every day and she was treated with a course of Nystatin  oral suspension for suspected esophageal candidiasis. A barium swallow study was done 08/18/2023 which showed a small hiatal hernia, mild to moderate esophageal dysmotility with tertiary contractions consistent with presbyesophagus, the barium tablet became stuck above the GE junction and tapering stricture was visualized. An EGD with esophageal dilatation as an outpatient at [facility] was planned.  She developed significant fatigue with persistent N/V  which worsened 3 days following her chemo infusion. She had a near syncopal episode on 5/26 with progressively worsening fatigue therefore she presented to the ED for further evaluation.  Labs in the ED showed a WBC count of 39.6.  Hemoglobin 9.8.  Platelets 197.  Potassium 5.3.  BUN 132 up from 43 twelve days prior to admission.  Creatinine 4.23 up from 2.91.  Albumin  2.9.  Total bili 0.5.  Alk phos 123.  AST 24.  ALT 21.  Lipase 118.  LDH 624.  TSH 2.193.  Chest x-ray was negative.  CTAP without contrast showed a similar appearance of the ill-defined soft tissue density mass within the abdominal pelvic right sided retroperitoneum with extension versus adjacent presumed lymphoma within the left-sided abdominal retroperitoneum, left-sided caliectasis likely due to left abdominal retroperitoneal lymphoma, soft tissue thickening about the posterior bladder wall with decreased soft tissue fullness to the right bladder base suspicious for lymphomatous involvement, decreased in perihepatic ascites and aortic atherosclerosis.  A GI consult was requested for further evaluation regarding persistent nausea, vomiting and abdominal pain. Food passes down the esophagus but gets stuck in the lower esophagus or at the top of her stomach which occurs every time she eats and results in vomiting white foam multiple times daily for the past few months. No coffee-ground or frank hematemesis. She is taking Omeprazole  once daily as previously prescribed.  No heartburn. She is passing normal brown bowel movements at home. No bloody or black stools. Last BM was 5/26. No NSAID use. Denies ever having an EGD. Non-smoker. No alcohol use.  She was seen by Dr. Arzella Laurence infectious disease specialist 08/29/2023 for hepatitis B treatment in setting of chemotherapy for B-cell lymphoma.  Patient was started on Entecavir  she will require close renal monitoring.  Patient denied any prior history of hepatitis B.  Received a blood transfusion  in 1972.  No known family history of liver disease.  Barium Swallow Study with Tablet 08/18/2023: FINDINGS: Swallowing: Appears normal. No vestibular penetration or aspiration seen.   Pharynx: Unremarkable.   Esophagus: No mucosal lesions. Smooth, tapering stricture above gastroesophageal junction.   Esophageal motility: Moderate esophageal dysmotility visualized with resultant contrast stasis and delayed emptying into the stomach.   Hiatal Hernia: Mild hiatal hernia.   Gastroesophageal reflux: None visualized, even with provacative maneuvers.   Ingested 13mm barium tablet: Became stuck just above the gastroesophageal junction, where the tapering stricture is visualized. Waited 3 minutes with subsequent swallows of water and barium, tablet remained stuck. Pt informed the tablet will dissolve on its own.   Other: None.   IMPRESSION: Suboptimal evaluation, secondary to patient's inability to remain upright.   1. Small hiatus hernia. 2. 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    ECHO 08/22/2023: IMPRESSIONS Left ventricular ejection fraction, by estimation, is 60 to 65%. The left ventricle has normal function. The left ventricle has no regional wall motion abnormalities. Left ventricular diastolic parameters are consistent with Grade I diastolic dysfunction (impaired relaxation). The average left ventricular global longitudinal strain is -17.5 %. The global longitudinal strain is normal. 1. 2. Right ventricular systolic function is normal. The right ventricular size is normal. The mitral valve is normal in structure. Mild to moderate mitral valve regurgitation. No evidence of mitral stenosis. 3. The aortic valve is grossly normal. Aortic valve regurgitation is trivial. No aortic stenosis is present. 4. The inferior vena cava is normal in size with greater than 50% respiratory variability, suggesting right atrial pressure  of 3 mmHg.  GI PROCEDURES:  Colonoscopy 05/23/2017: Normal colonoscopy Recall colonoscopy 5 years  Colonoscopy 04/19/2012: Colon polyps, procedure report not found in Care Everywhere.  Past Medical History:  Diagnosis Date   CLL (chronic lymphocytic leukemia) (HCC)    Dyspnea 2021   with exersion    Early cataracts, bilateral    Elevated hemoglobin A1c    Fibroids    GERD (gastroesophageal reflux disease)    Hyperlipidemia    Hypertension    Lymphoma (HCC)    Osteopenia     Past Surgical History:  Procedure Laterality Date   ABDOMINAL HYSTERECTOMY     APPENDECTOMY     CATARACT EXTRACTION, BILATERAL Bilateral 09/2017   Dr. Celina Colla   IR IMAGING GUIDED PORT INSERTION  08/21/2023   PAROTID GLAND TUMOR EXCISION  2012   benign   TONSILLECTOMY AND ADENOIDECTOMY      Prior to Admission medications   Medication Sig Start Date End Date Taking? Authorizing Provider  allopurinol  (ZYLOPRIM ) 100 MG tablet TAKE 1 TABLET(100 MG) BY MOUTH TWICE DAILY 08/21/23  Yes Frankie Israel, MD  amLODipine  (NORVASC ) 5 MG tablet Take 1 tablet (5 mg total) by mouth daily. 06/14/23  Yes Ngetich, Dinah C, NP  B Complex Vitamins (VITAMIN B-COMPLEX PO) Take 1 tablet by mouth daily.   Yes [provider]  furosemide  (LASIX ) 20 MG tablet TAKE 1 TABLET(20 MG) BY MOUTH DAILY Patient taking differently: Take 20 mg by mouth daily. 08/21/23  Yes Frankie Israel, MD  loratadine (CLARITIN) 10 MG tablet Take 10 mg by mouth daily.   Yes [provider]  nadolol  (CORGARD ) 20 MG tablet TAKE 1 TABLET BY MOUTH DAILY FOR BLOOD PRESSURE  OR TREMORS Patient taking differently: Take 20 mg by mouth daily. 07/27/23  Yes Ngetich, Dinah C, NP  omeprazole  (PRILOSEC) 20 MG capsule Open capsule and sprinkle on food once daily Patient taking differently: Take 20 mg by mouth daily. 08/09/23  Yes Santina Cull R, PA-C  ondansetron  (ZOFRAN ) 8 MG tablet Take 1 tablet (8 mg total) by mouth every 8 (eight) hours  as needed for nausea or vomiting. Start on the third day after cyclophosphamide chemotherapy. Patient taking differently: Take 8 mg by mouth daily as needed for nausea or vomiting. 08/18/23  Yes Kale, Gautam Kishore, MD  polyethylene glycol (MIRALAX  / GLYCOLAX ) 17 g packet Take 17 g by mouth daily as needed for mild constipation or moderate constipation. Also available OTC 06/18/23  Yes Rai, Ripudeep K, MD  prochlorperazine  (COMPAZINE ) 10 MG tablet Take 1 tablet (10 mg total) by mouth every 6 (six) hours as needed for nausea or vomiting. 08/18/23  Yes Frankie Israel, MD  senna-docusate (SENOKOT-S) 8.6-50 MG tablet Take 2 tablets by mouth at bedtime. For constipation 06/18/23  Yes Rai, Ripudeep K, MD  torsemide  (DEMADEX ) 20 MG tablet Take 20 mg by mouth daily as needed (fluid). 08/17/23  Yes [provider]  traMADol  (ULTRAM ) 50 MG tablet Take 1 tablet (50 mg total) by mouth every 8 (eight) hours as needed for moderate pain (pain score 4-6) or severe pain (pain score 7-10) (pain). Patient taking differently: Take 50 mg by mouth daily as needed for moderate pain (pain score 4-6) or severe pain (pain score 7-10) (pain). 06/18/23  Yes Rai, Ripudeep K, MD  amoxicillin -clavulanate (AUGMENTIN ) 875-125 MG tablet Take 1 tablet by mouth every 12 (twelve) hours. Patient not taking: Reported on 08/29/2023 08/09/23   Kingsley, Victoria K, DO  azithromycin  (ZITHROMAX ) 250 MG tablet Take 1 tablet (250 mg total) by mouth daily. Starting tomorrow, take 1 every day until finished. Patient not taking: Reported on 08/29/2023 08/09/23   Kingsley, Victoria K, DO  lidocaine -prilocaine  (EMLA ) cream Apply to affected area once Patient not taking: Reported on 08/29/2023 08/18/23   Frankie Israel, MD  losartan  (COZAAR ) 50 MG tablet Take 50 mg by mouth daily. Patient not taking: Reported on 08/29/2023    [provider]  nystatin  (MYCOSTATIN ) 100000 UNIT/ML suspension Take 5 mLs (500,000 Units total) by mouth 4  (four) times daily. Patient not taking: Reported on 08/29/2023 08/09/23   Edmonia Gottron, PA-C  gabapentin  (NEURONTIN ) 100 MG capsule Take 1 capsule 3 x /day for Neuropathy Pain Patient not taking: Reported on 01/25/2020 08/02/19 01/25/20  Vangie Genet, MD    Current Facility-Administered Medications  Medication Dose Route Frequency Provider Last Rate Last Admin   Chlorhexidine Gluconate Cloth 2 % PADS 6 each  6 each Topical Daily Angelene Kelly, MD   6 each at 08/30/23 0935   entecavir (BARACLUDE) tablet 0.5 mg  0.5 mg Oral Q Wed Snider, Cynthia, MD   0.5 mg at 08/30/23 1005   feeding supplement (BOOST / RESOURCE BREEZE) liquid 1 Container  1 Container Oral TID BM Alfonse Angle, Benuel Brazier, MD       fluconazole  (DIFLUCAN ) IVPB 200 mg  200 mg Intravenous Q24H Kale, Gautam Kishore, MD       Gerhardt's butt cream   Topical BID Lama, Gagan S, MD   1 Application at 08/30/23 0935   heparin  injection 5,000 Units  5,000 Units Subcutaneous Q8H Kakrakandy, Arshad N, MD   5,000 Units at 08/30/23 0658   midodrine (PROAMATINE) tablet  10 mg  10 mg Oral TID WC Chucky Craver, MD   5 mg at 08/29/23 1830   mupirocin  ointment (BACTROBAN ) 2 % 1 Application  1 Application Nasal BID Ozell Blunt, MD   1 Application at 08/30/23 0865   Oral care mouth rinse  15 mL Mouth Rinse PRN Ozell Blunt, MD       phenol (CHLORASEPTIC) mouth spray 1 spray  1 spray Mouth/Throat PRN Oral Billings, MD       polyethylene glycol (MIRALAX  / GLYCOLAX ) packet 17 g  17 g Oral Daily PRN Angelene Kelly, MD       senna-docusate (Senokot-S) tablet 2 tablet  2 tablet Oral QHS Angelene Kelly, MD       sodium bicarbonate  150 mEq in dextrose  5 % 1,150 mL infusion   Intravenous Continuous Chucky Craver, MD 75 mL/hr at 08/30/23 0850 Infusion Verify at 08/30/23 0850    Allergies as of 08/28/2023 - Review Complete 08/28/2023  Allergen Reaction Noted   Nexium [esomeprazole magnesium] Other (See Comments) 02/22/2013   Peanut  butter flavoring agent (non-screening) Itching 06/14/2023    Family History  Problem Relation Age of Onset   Leukemia Mother    Hypertension Father    Diabetes Sister    Heart disease Brother    Diabetes Brother    Diabetes Brother    Hypertension Son    Heart disease Son     Social History   Socioeconomic History   Marital status: Widowed    Spouse name: Not on file   Number of children: 3   Years of education: Not on file   Highest education level: Not on file  Occupational History   Occupation: retired  Tobacco Use   Smoking status: Never   Smokeless tobacco: Never  Vaping Use   Vaping status: Never Used  Substance and Sexual Activity   Alcohol use: No   Drug use: Never   Sexual activity: Not on file  Other Topics Concern   Not on file  Social History Narrative   Not on file   Social Drivers of Health   Financial Resource Strain: Not on file  Food Insecurity: No Food Insecurity (08/29/2023)   Hunger Vital Sign    Worried About Running Out of Food in the Last Year: Never true    Ran Out of Food in the Last Year: Never true  Transportation Needs: No Transportation Needs (08/29/2023)   PRAPARE - Administrator, Civil Service (Medical): No    Lack of Transportation (Non-Medical): No  Physical Activity: Not on file  Stress: Not on file  Social Connections: Moderately Integrated (08/29/2023)   Social Connection and Isolation Panel [NHANES]    Frequency of Communication with Friends and Family: More than three times a week    Frequency of Social Gatherings with Friends and Family: Three times a week    Attends Religious Services: More than 4 times per year    Active Member of Clubs or Organizations: Yes    Attends Banker Meetings: More than 4 times per year    Marital Status: Widowed  Intimate Partner Violence: Not At Risk (08/29/2023)   Humiliation, Afraid, Rape, and Kick questionnaire    Fear of Current or Ex-Partner: No    Emotionally  Abused: No    Physically Abused: No    Sexually Abused: No   Review of Systems: Gen: + Weight loss. CV: Denies chest pain, palpitations or edema. Resp: Denies  cough, shortness of breath of hemoptysis.  GI: See HPI. GU : Denies urinary burning, blood in urine, increased urinary frequency or incontinence. MS: Denies joint pain, muscles aches or weakness. Derm: Denies rash, itchiness, skin lesions or unhealing ulcers. Psych: Denies depression, anxiety, memory loss or confusion. Heme: Denies easy bruising, bleeding. Neuro:  Denies headaches, dizziness or paresthesias. Endo:  Denies any problems with DM, thyroid  or adrenal function.  Physical Exam: Vital signs in last 24 hours: Temp:  [97.6 F (36.4 C)-98.2 F (36.8 C)] 98.2 F (36.8 C) (05/28 0800) Pulse Rate:  [69-77] 74 (05/28 1003) Resp:  [13-23] 18 (05/28 1003) BP: (99-119)/(35-50) 112/40 (05/28 1000) SpO2:  [94 %-98 %] 95 % (05/28 1003) Weight:  [65 kg-67 kg] 67 kg (05/28 0500) Last BM Date : 08/28/23 General: Ill fatigued appearing 83 year old female in no acute distress. Head:  Normocephalic and atraumatic. Eyes:  No scleral icterus. Conjunctiva pink. Ears:  Normal auditory acuity. Nose:  No deformity, discharge or lesions. Mouth:  Dentition intact.  Scattered white coating on tongue. Neck:  Supple. No lymphadenopathy or thyromegaly.  Lungs: Breath sounds clear throughout. No wheezes, rhonchi or crackles.  Heart: Regular rate and rhythm, no murmurs. Abdomen: Early distended, nontender.  Component of of anasarca to the abdominal wall.  Hypoactive bowel sounds all 4 quadrants. Rectal: Deferred. Musculoskeletal:  Symmetrical without gross deformities.  Pulses:  Normal pulses noted. Extremities: Moderate bilateral lower extremity edema. Neurologic: Alert and oriented x 3.  Speech is softly spoken but clear.  Moves all extremities. Skin:  Intact without significant lesions or rashes. Psych:  Alert and cooperative. Normal  mood and affect.  Intake/Output from previous day: 05/27 0701 - 05/28 0700 In: 1802 [P.O.:290; I.V.:1412; IV Piggyback:100] Out: 1070 [Urine:1050; Emesis/NG output:20] Intake/Output this shift: Total I/O In: 211.9 [I.V.:211.9] Out: -   Lab Results: Recent Labs    08/28/23 1518 08/29/23 0424 08/30/23 0539  WBC 39.6* 25.2* 13.8*  HGB 9.8* 8.4* 8.0*  HCT 32.3* 26.5* 25.6*  PLT 197 163 129*   BMET Recent Labs    08/28/23 1518 08/29/23 0424 08/30/23 0539  NA 136 133* 139  K 5.3* 4.6 4.4  CL 104 100 100  CO2 15* 19* 23  GLUCOSE 117* 126* 131*  BUN 132* 126* 115*  CREATININE 4.23* 4.11* 3.94*  CALCIUM 8.3* 7.1* 6.7*   LFT Recent Labs    08/29/23 0424 08/30/23 0539  PROT 5.1* 4.8*  ALBUMIN 2.3* 2.2*  AST 21 21  ALT 17 13  ALKPHOS 92 84  BILITOT 0.6 0.8  BILIDIR 0.2  --   IBILI 0.4  --    PT/INR No results for input(s): "LABPROT", "INR" in the last 72 hours. Hepatitis Panel No results for input(s): "HEPBSAG", "HCVAB", "HEPAIGM", "HEPBIGM" in the last 72 hours.    Studies/Results: CT ABDOMEN PELVIS WO CONTRAST Result Date: 08/28/2023 CLINICAL DATA:  Failure to thrive. 1st chemotherapy this week. Known lymphoma. * Tracking Code: BO * EXAM: CT ABDOMEN AND PELVIS WITHOUT CONTRAST TECHNIQUE: Multidetector CT imaging of the abdomen and pelvis was performed following the standard protocol without IV contrast. RADIATION DOSE REDUCTION: This exam was performed according to the departmental dose-optimization program which includes automated exposure control, adjustment of the mA and/or kV according to patient size and/or use of iterative reconstruction technique. COMPARISON:  08/09/2023 FINDINGS: Lower chest: Initial thickening at the lung bases likely due to subsegmental atelectasis and/or air trapping. Normal heart size without pericardial or pleural effusion. Hepatobiliary: Normal noncontrast appearance of the  liver, gallbladder. No gross intrahepatic biliary duct  dilatation. Pancreas: Normal, without mass or ductal dilatation. Spleen: Normal in size, without focal abnormality. Adrenals/Urinary Tract: Normal left adrenal gland. No left renal calculi. Mild left-sided caliectasis is unchanged. The dominant mass centered in the right abdominal retroperitoneum is amorphous, ill-defined and difficult to on this noncontrast exam. Estimated at 12.6 by 9.6 cm on 38/2, relatively similar to 08/09/2023. This is encompasses and obscures the right adrenal gland right kidney. Amorphous soft tissue extends into the left-sided abdominal retroperitoneum including on 43/2, relatively similar. Soft tissue thickening is most apparent about the posterior bladder wall with suggestion of soft tissue fullness about the right bladder base including on 73/2. This is less masslike than on the prior exam. Stomach/Bowel: Tiny hiatal hernia. No gastric outlet obstruction. Normal colon and terminal ileum. Normal small bowel caliber. Vascular/Lymphatic: Aortic atherosclerosis. Prominent but not pathologically sized abdominal retroperitoneal nodes. Reproductive: Hysterectomy.  No adnexal mass. Other: Pelvic floor laxity. Small volume perihepatic ascites, decreased. Musculoskeletal: No acute osseous abnormality. IMPRESSION: 1. Limited exam secondary to lack of oral or IV contrast. 2. Grossly similar appearance of amorphous, ill-defined soft tissue density mass within the abdominopelvic right-sided retroperitoneum. Extension or adjacent presumed lymphoma within the left-sided abdominal retroperitoneum. 3. No gross bowel obstruction or other acute complication. 4. Similar mild left-sided caliectasis, likely due to left abdominal retroperitoneal lymphoma. 5. Soft tissue thickening about the posterior bladder wall with decreased soft tissue fullness about the right bladder base. Suspicious for lymphomatous involvement. 6. Decrease in small volume perihepatic ascites 7. Incidental findings, including: Aortic  Atherosclerosis (ICD10-I70.0). Tiny hiatal hernia. Electronically Signed   By: Lore Rode M.D.   On: 08/28/2023 16:51   DG Chest Portable 1 View Result Date: 08/28/2023 CLINICAL DATA:  Weakness, lightheadedness, dizziness, history of lymphoma EXAM: PORTABLE CHEST 1 VIEW COMPARISON:  08/15/2023 FINDINGS: Single frontal view of the chest demonstrates right chest wall port via internal jugular approach, tip overlying superior vena cava. Cardiac silhouette is unremarkable. No acute airspace disease, effusion, or pneumothorax. No acute bony abnormalities. IMPRESSION: 1. No acute intrathoracic process. Electronically Signed   By: Bobbye Burrow M.D.   On: 08/28/2023 15:31    IMPRESSION/PLAN:  83 year old female with N/V and epigastric pain x 3 months.  Patient endorses swallowing food without any difficulty but food sits in the lower esophagus/top of her stomach which results in vomiting white foam emesis multiple times daily. Outpatient barium swallow study 5/16 showed a small hiatal hernia, the barium tablet became stuck above the GE junction and tapering stricture was visualized and mild to moderate esophageal dysmotility with tertiary contractions consistent with presbyesophagus. Her right retroperitoneal mass may be somewhat contributing to her GI symptoms. On Diflucan  IV for suspected esophageal candidiasis. - EGD with possible esophageal dilatation tomorrow with Dr. Venice Gillis, benefits and risks discussed with the patient and son, including risk with sedation, risk of bleeding, perforation and infection - Continue dysphagia 2 diet as tolerated - NPO after midnight  - Ondansetron  4 mg p.o. or IV every 6 hours as needed - Pantoprazole 40 mg p.o. daily as tolerated (patient had headaches when on Nexium in the past)  Newly diagnosed high-grade large B-cell lymphoma presenting as right perinephric mass. Started on  R-miniCHOP, status post cycle 1 on 08/23/23. CTAP without contrast 5/26 showed a similar  appearance of the ill-defined soft tissue density mass within the abdominal pelvic right sided retroperitoneum with extension versus adjacent presumed lymphoma within the left-sided abdominal retroperitoneum, left-sided  caliectasis likely due to left abdominal retroperitoneal lymphoma, soft tissue thickening about the posterior bladder wall with decreased soft tissue fullness to the right bladder base suspicious for lymphomatous involvement, decreased in perihepatic ascites  - Oncology following  History of CLL/SLL with splenomegaly diagnosed December 2021   Autoimmune hemolytic anemia with splenomegaly. Hg 8.4 -> 8.0. No overt GI bleeding. -CBC in am -Defer transfusion recommendations to oncology   AKI on CKD stage III -Renal consult in process  Chronic hepatis B. Hep B surface antigen positive, Hep B core total antibody positive, Hep B surface antibody negative and Hep B DNA 110 on 08/18/2023.  Normal LFTs. Evaluated by ID, started on Entecavir. -Hepatic panel and BMP in am  Thrombocytopenia.  Platelet count 129.  Personal history of adenomatous polyps.  Colonoscopy 05/2017 by Dr. Dany Dyke, no polyps.    Tory Freiberg  08/30/2023, 12:49 PM   Attending physician's note   I have taken history, reviewed the chart and examined the patient. I performed a substantive portion of this encounter, including complete performance of at least one of the key components, in conjunction with the APP. I agree with the Advanced Practitioner's note, impression and recommendations.   Dysphagia with Ba swallow showing distal eso stricture/dysmotility and presbyesophagus. Has been on nystatin /Diflucan  for suspected eso candidiasis.  Large infiltrative R/P mass (13 cm x 10 cm) Dx as high grade B-cell lymphoma. S/P RCHOP (1st dose 5/21). No bowel obstruction but significant mass effect.  AKI on CKD with R sided hydronephrosis.  Chronic Hep B on Entecavir (to prevent activation on chemo)  AIHA with  splenomegaly, prev CLL. Plts 130K  Plan: -IV Protonix -EGD with possible balloon dil/Bx in AM -Also being evaluated for IR for nephrostomy tube placement- This should take precedence over EGD in case of any scheduling conflict.  D/W pt and son [name] in detail including risks and benefits.  They do understand higher than baseline risks including risks of perforation, bleeding, aspiration.  Benefits were also discussed.  They wish to proceed.   Magnus Schuller, MD [facility] [phone]

## 2023-08-30 NOTE — Evaluation (Addendum)
 Clinical/Bedside Swallow Evaluation Patient Details  Name: SHEKETA ENDE MRN: [id number] Date of Birth: 08-20-1940  Today's Date: 08/30/2023 Time: SLP Start Time (ACUTE ONLY): 4098 SLP Stop Time (ACUTE ONLY): 1025 SLP Time Calculation (min) (ACUTE ONLY): 43 min  Past Medical History:  Past Medical History:  Diagnosis Date   CLL (chronic lymphocytic leukemia) (HCC)    Dyspnea 2021   with exersion    Early cataracts, bilateral    Elevated hemoglobin A1c    Fibroids    GERD (gastroesophageal reflux disease)    Hyperlipidemia    Hypertension    Lymphoma (HCC)    Osteopenia    Past Surgical History:  Past Surgical History:  Procedure Laterality Date   ABDOMINAL HYSTERECTOMY     APPENDECTOMY     CATARACT EXTRACTION, BILATERAL Bilateral 09/2017   Dr. Celina Colla   IR IMAGING GUIDED PORT INSERTION  08/21/2023   PAROTID GLAND TUMOR EXCISION  2012   benign   TONSILLECTOMY AND ADENOIDECTOMY     HPI:  Patient is an 83 year old female admitted to [facility] with abdominal pain, dizziness, nausea and vomiting as well as generalized weakness.  Found to be in acute renal failure, ? complications after receiving chemotherapy for large B-cell lymphoma.  Past medical history significant for large B-cell lymphoma, CLL, GERD, chronic hepatitis B.  CT of chest showed interstitial lung disease, small hiatus hernia and small amount of ascites.  Patient with retroperitoneal mass.  She reports problems with swallowing formed foods and has experienced weight loss from 155-160 in March to 141 in April 2025.  Swallow evaluation ordered.    Assessment / Plan / Recommendation  Clinical Impression  Patient presents with no focal cranial nerve deficits but does endorse issues with odynophagia in her oral cavity and esophagus with swallowing prior to po during session.  She demonstrates generalized weakness her mouth was full of secretions.  With cues she was able to bring secretions to the anterior portion of her  mouth to allow SLP to gently suction the anterior removing 90% of secretions present.  Patient reports history of oral candidiasis for which she was treated with medication and she is currently on Diflucan .  With encouragement she was willing to consume small amount of warm apple juice and several boluses of ice cream.  Her swallow clinically appeared to be mildly delayed but no indication of aspiration nor odynophagia.     Alerted RN that patient was consuming intake and offered to assist with medication administration.  RN provided patient pill with warm juice but with swallow attempt,  patient gagged and reported sensation of pill lodging.  2 boluses of ice cream aided pharyngeal clearance per patient.  She then momentarily reported sensing tablet at the lower part of her esophagus that also effectively cleared with apple juice, and she has diagnosed history of mild to moderate esophageal dysmotility which likely contributes to her dysphagia symptoms. In discussion with patient she denied odynophagia during intake.  Does endorse sensitive gag thus willing to consume her medications with ice cream.    When provided with options to advance to a minced diet or mechanical soft diet patient advised she would like to advance to minced therefore SLP wrote diet order.  SLP suspect her poor intake may be multifactorial including taste changes, pretrip peritoneal as well as odynophagia .    SLP prepared swallow precautions signs and discussed with RN and pt to reinforce comfort of intake for pt. will follow-up briefly to assure strategies and  precautions are helpful to maximize patient's comfort and intake.  Recommend family be allowed to bring in any food items patient will consume, communicated with RD and RN via secure chat and all in agreement.  Thank you so much for this consult. SLP Visit Diagnosis: Dysphagia, oral phase (R13.11) (Known esophageal dysmotility)    Aspiration Risk  Mild aspiration risk     Diet Recommendation Dysphagia 2 (Fine chop);Thin liquid    Liquid Administration via: Straw Medication Administration: Other (Comment) (Start with liquid and then provide with ice cream whole and follow with room temperature or warm liquids.) Supervision: Staff to assist with self feeding Compensations: Slow rate;Small sips/bites;Other (Comment) (Oral care with suction prior to p.o. administration after cueing patient to propel secretions to the anterior portion of her mouth.  Start oral intake with liquid-preferably warm for patient) Postural Changes: Seated upright at 90 degrees;Remain upright for at least 30 minutes after po intake    Other  Recommendations Oral Care Recommendations: Oral care before and after PO    Recommendations for follow up therapy are one component of a multi-disciplinary discharge planning process, led by the attending physician.  Recommendations may be updated based on patient status, additional functional criteria and insurance authorization.  Follow up Recommendations No SLP follow up      Assistance Recommended at Discharge    Functional Status Assessment Patient has had a recent decline in their functional status and demonstrates the ability to make significant improvements in function in a reasonable and predictable amount of time.  Frequency and Duration min 1 x/week  1 week       Prognosis Prognosis for improved oropharyngeal function: Good Barriers to Reach Goals: Other (Comment);Time post onset;Motivation      Swallow Study   General Date of Onset: 08/30/23 HPI: Patient is an 83 year old female admitted to [facility] with abdominal pain, dizziness, nausea and vomiting as well as generalized weakness.  Found to be in acute renal failure, ? complications after receiving chemotherapy for large B-cell lymphoma.  Past medical history significant for large B-cell lymphoma, CLL, GERD, chronic hepatitis B.  CT of chest showed interstitial lung disease,  small hiatus hernia and small amount of ascites.  Patient with retroperitoneal mass.  She reports problems with swallowing formed foods and has experienced weight loss from 155-160 in March to 141 in April 2025.  Swallow evaluation ordered. Type of Study: Bedside Swallow Evaluation Previous Swallow Assessment: Prior esophagram showed mild to moderate esophageal dysmotility with tertiary contractions and small hiatus hernia completed 08/19/2023. Diet Prior to this Study: Dysphagia 1 (pureed);Thin liquids (Level 0) Temperature Spikes Noted: No Respiratory Status: Room air History of Recent Intubation: No Behavior/Cognition: Alert;Cooperative Oral Cavity Assessment: Excessive secretions;Other (comment) Oral Care Completed by SLP: Other (Comment) (Gentle oral suction provided at anterior mouth after patient propelled viscous white and yellow-tinged secretions just anterior to dentition.) Oral Cavity - Dentition: Adequate natural dentition Vision: Impaired for self-feeding (Hands and arms are edematous) Self-Feeding Abilities: Total assist Patient Positioning: Upright in bed Baseline Vocal Quality: Low vocal intensity;Other (comment) (Patient reports voice is weak) Volitional Cough: Weak Volitional Swallow: Able to elicit    Oral/Motor/Sensory Function Overall Oral Motor/Sensory Function: Generalized oral weakness   Ice Chips Ice chips: Not tested   Thin Liquid Thin Liquid: Within functional limits Presentation: Straw (Suction liquid into straw and placed tip of straw into mouth to allow small amount) Other Comments: Minimal inconsistent delay no signs symptom of aspiration and no reports of odynophagia after  oral care and removal of secretions    Nectar Thick Nectar Thick Liquid: Not tested Other Comments: Patient politely declined to attempt Ensure   Honey Thick Honey Thick Liquid: Not tested   Puree Puree: Within functional limits Presentation: Spoon Other Comments: Ice cream boluses    Solid     Solid: Not tested Other Comments: Patient politely declined and SLP did not have any solid food to offer that was minced, pt demonstrated adequate mandibular movement with OME however      Chantal Comment 08/30/2023,11:08 AM  Maudie Sorrow, MS [facility] SLP Acute Rehab Services Office [phone]   Pt is on Diflucan  -

## 2023-08-31 ENCOUNTER — Encounter (HOSPITAL_COMMUNITY): Payer: Self-pay | Admitting: Internal Medicine

## 2023-08-31 ENCOUNTER — Encounter (HOSPITAL_COMMUNITY)
Admission: EM | Disposition: A | Discharge status: Home / Self Care | Payer: Self-pay | Source: Home / Self Care | Attending: Internal Medicine

## 2023-08-31 ENCOUNTER — Inpatient Hospital Stay (HOSPITAL_COMMUNITY): Admitting: Certified Registered Nurse Anesthetist

## 2023-08-31 ENCOUNTER — Encounter: Payer: Self-pay | Admitting: Hematology

## 2023-08-31 DIAGNOSIS — K222 Esophageal obstruction: Secondary | ICD-10-CM

## 2023-08-31 DIAGNOSIS — C8338 Diffuse large B-cell lymphoma, lymph nodes of multiple sites: Secondary | ICD-10-CM | POA: Diagnosis not present

## 2023-08-31 DIAGNOSIS — R531 Weakness: Secondary | ICD-10-CM | POA: Diagnosis not present

## 2023-08-31 DIAGNOSIS — K2970 Gastritis, unspecified, without bleeding: Secondary | ICD-10-CM | POA: Diagnosis not present

## 2023-08-31 DIAGNOSIS — K269 Duodenal ulcer, unspecified as acute or chronic, without hemorrhage or perforation: Secondary | ICD-10-CM

## 2023-08-31 DIAGNOSIS — R1310 Dysphagia, unspecified: Secondary | ICD-10-CM | POA: Diagnosis not present

## 2023-08-31 DIAGNOSIS — K449 Diaphragmatic hernia without obstruction or gangrene: Secondary | ICD-10-CM | POA: Diagnosis not present

## 2023-08-31 DIAGNOSIS — K2980 Duodenitis without bleeding: Secondary | ICD-10-CM | POA: Diagnosis not present

## 2023-08-31 DIAGNOSIS — N179 Acute kidney failure, unspecified: Secondary | ICD-10-CM | POA: Diagnosis not present

## 2023-08-31 HISTORY — PX: BALLOON DILATION: SHX5330

## 2023-08-31 HISTORY — PX: ESOPHAGOGASTRODUODENOSCOPY: SHX5428

## 2023-08-31 LAB — RENAL FUNCTION PANEL
Albumin: 2.2 g/dL — ABNORMAL LOW (ref 3.5–5.0)
Anion gap: 15 (ref 5–15)
BUN: 113 mg/dL — ABNORMAL HIGH (ref 8–23)
CO2: 25 mmol/L (ref 22–32)
Calcium: 6.7 mg/dL — ABNORMAL LOW (ref 8.9–10.3)
Chloride: 101 mmol/L (ref 98–111)
Creatinine, Ser: 3.64 mg/dL — ABNORMAL HIGH (ref 0.44–1.00)
GFR, Estimated: 12 mL/min — ABNORMAL LOW (ref >60)
Glucose, Bld: 96 mg/dL (ref 70–99)
Phosphorus: 7 mg/dL — ABNORMAL HIGH (ref 2.5–4.6)
Potassium: 4.5 mmol/L (ref 3.5–5.1)
Sodium: 141 mmol/L (ref 135–145)

## 2023-08-31 LAB — CBC
HCT: 25.7 % — ABNORMAL LOW (ref 36.0–46.0)
Hemoglobin: 7.9 g/dL — ABNORMAL LOW (ref 12.0–15.0)
MCH: 27.1 pg (ref 26.0–34.0)
MCHC: 30.7 g/dL (ref 30.0–36.0)
MCV: 88 fL (ref 80.0–100.0)
Platelets: 119 K/uL — ABNORMAL LOW (ref 150–400)
RBC: 2.92 MIL/uL — ABNORMAL LOW (ref 3.87–5.11)
RDW: 13.4 % (ref 11.5–15.5)
WBC: 10.3 K/uL (ref 4.0–10.5)
nRBC: 0 % (ref 0.0–0.2)

## 2023-08-31 SURGERY — EGD (ESOPHAGOGASTRODUODENOSCOPY)
Anesthesia: Monitor Anesthesia Care

## 2023-08-31 MED ORDER — PROPOFOL 10 MG/ML IV BOLUS
INTRAVENOUS | Status: DC | PRN
  Administered 2023-08-31: 25 mg via INTRAVENOUS

## 2023-08-31 MED ORDER — PROPOFOL 500 MG/50ML IV EMUL
INTRAVENOUS | Status: DC | PRN
  Administered 2023-08-31: 100 ug/kg/min via INTRAVENOUS

## 2023-08-31 MED ORDER — PROPOFOL 500 MG/50ML IV EMUL
INTRAVENOUS | Status: AC
  Filled 2023-08-31: qty 50

## 2023-08-31 MED ORDER — FLUCONAZOLE IN SODIUM CHLORIDE 200-0.9 MG/100ML-% IV SOLN
200.0000 mg | INTRAVENOUS | Status: DC
  Administered 2023-08-31: 200 mg via INTRAVENOUS
  Filled 2023-08-31: qty 100

## 2023-08-31 MED ORDER — PHENYLEPHRINE 80 MCG/ML (10ML) SYRINGE FOR IV PUSH (FOR BLOOD PRESSURE SUPPORT)
PREFILLED_SYRINGE | INTRAVENOUS | Status: DC | PRN
  Administered 2023-08-31: 120 ug via INTRAVENOUS

## 2023-08-31 MED ORDER — SODIUM CHLORIDE 0.9 % IV SOLN: INTRAVENOUS | Status: DC

## 2023-08-31 MED ORDER — LACTATED RINGERS IV SOLN: INTRAVENOUS | Status: DC

## 2023-08-31 NOTE — Op Note (Addendum)
 [facility] Patient Name: Jacqueline Hancock Procedure Date: 08/31/2023 MRN: [id number] Attending MD: Lajuan Pila , MD, [id number] Date of Birth: 08-May-1940 CSN: [id number] Age: 83 Admit Type: Inpatient Procedure:                Upper GI endoscopy Indications:              1. Dysphagia with Ba swallow showing distal eso                            stricture/dysmotility and presbyesophagus. Has been                            on nystatin /Diflucan  for suspected eso candidiasis.                            2. Large infiltrative R/P mass (13 cm x 10 cm) Dx                            as high grade B-cell lymphoma. S/P RCHOP (1st dose                            5/21). No bowel obstruction but significant mass                            effect. Providers:                Lajuan Pila, MD, Ila Malay, RN, Tyrus Gallus,                            Technician Referring MD:              Medicines:                Monitored Anesthesia Care Complications:            No immediate complications. Estimated Blood Loss:     Estimated blood loss was minimal. Procedure:                Pre-Anesthesia Assessment:                           - Prior to the procedure, a History and Physical                            was performed, and patient medications and                            allergies were reviewed. The patient's tolerance of                            previous anesthesia was also reviewed. The risks                            and benefits of the procedure and the sedation  options and risks were discussed with the patient.                            All questions were answered, and informed consent                            was obtained. Prior Anticoagulants: The patient has                            taken no anticoagulant or antiplatelet agents. ASA                            Grade Assessment: IV - A patient with severe                            systemic  disease that is a constant threat to life.                            After reviewing the risks and benefits, the patient                            was deemed in satisfactory condition to undergo the                            procedure.                           After obtaining informed consent, the endoscope was                            passed under direct vision. Throughout the                            procedure, the patient's blood pressure, pulse, and                            oxygen saturations were monitored continuously. The                            [id number]-[id number] ([id number]) Olympus endoscope was introduced                            through the mouth, and advanced to the second part                            of duodenum. The upper GI endoscopy was                            accomplished without difficulty. The patient                            tolerated the procedure well. Scope In: Scope Out: Findings:      One benign-appearing, intrinsic moderate (circumferential scarring or  stenosis; an endoscope may pass) stenosis was found 35 cm from the       incisors. This stenosis measured 1.0 cm (inner diameter). The stenosis       was traversed. A TTS dilator was passed through the scope. Dilation with       a 15-16.5-18 mm balloon dilator was performed to 16.5 mm. The dilation       site was examined and showed moderate mucosal disruption and moderate       improvement in luminal narrowing.      A small hiatal hernia was present.      Localized mild inflammation characterized by congestion (edema) and       erythema was found in the gastric antrum. Biopsies were taken with a       cold forceps for histology.      Six non-bleeding cratered duodenal ulcers with no stigmata of bleeding       were found in the duodenal bulb, in the first portion of the duodenum       and in the second portion of the duodenum. The largest lesion was 14 mm       in largest dimension with slightly  "heaped up margins" and clean whitish       base. Biopsies were taken with a cold forceps for histology from the       edges and base. Estimated blood loss was minimal.      The exam was otherwise without abnormality. Impression:               - Benign-appearing esophageal stenosis. Dilated.                           - Small hiatal hernia.                           - Gastritis. Biopsied.                           - Non-bleeding duodenal ulcers with no stigmata of                            bleeding but "heaped up margins". Biopsied to r/o                            SB lymphoma.                           - The examination was otherwise normal. No                            candidiasis. Moderate Sedation:      Not Applicable - Patient had care per Anesthesia. Recommendation:           - Return patient to hospital ward for ongoing care.                           - Soft diet.                           - Continue present medications including Protonix  40 twice daily. I do recommend Carafate elixir 1 g                            p.o. twice daily (lower dosr d/t CKD).                           - Await pathology results.                           - The findings and recommendations were discussed                            with the patient's family.                           - Can stop Diflucan .                           - Patient being considered for palliative                            consultation. I agree with that. GI will sign off                            for now. Procedure Code(s):        --- Professional ---                           [phone], Esophagogastroduodenoscopy, flexible,                            transoral; with transendoscopic balloon dilation of                            esophagus (less than 30 mm diameter)                           43239, 59, Esophagogastroduodenoscopy, flexible,                            transoral; with biopsy, single or  multiple Diagnosis Code(s):        --- Professional ---                           K22.2, Esophageal obstruction                           K44.9, Diaphragmatic hernia without obstruction or                            gangrene                           K29.70, Gastritis, unspecified, without bleeding                           K26.9, Duodenal ulcer, unspecified as acute  or                            chronic, without hemorrhage or perforation                           R13.10, Dysphagia, unspecified CPT copyright 2022 American Medical Association. All rights reserved. The codes documented in this report are preliminary and upon coder review may  be revised to meet current compliance requirements. Lajuan Pila, MD 08/31/2023 12:48:11 PM This report has been signed electronically. Number of Addenda: 0

## 2023-08-31 NOTE — Plan of Care (Signed)
  Problem: Clinical Measurements: Goal: Ability to maintain clinical measurements within normal limits will improve Outcome: Progressing Goal: Cardiovascular complication will be avoided Outcome: Progressing   Problem: Nutrition: Goal: Adequate nutrition will be maintained Outcome: Progressing   Problem: Pain Managment: Goal: General experience of comfort will improve and/or be controlled Outcome: Progressing   Problem: Skin Integrity: Goal: Risk for impaired skin integrity will decrease Outcome: Progressing

## 2023-08-31 NOTE — Progress Notes (Signed)
 Rituxan has been substituted to insurance preferred biosimilar Ruxience.  Dodi Leu, PharmD, MBA

## 2023-08-31 NOTE — Interval H&P Note (Signed)
 History and Physical Interval Note:  08/31/2023 11:54 AM  Jacqueline Hancock  has presented today for surgery, with the diagnosis of [place], vomiting, abnormal barium swallow study.  The various methods of treatment have been discussed with the patient and family. After consideration of risks, benefits and other options for treatment, the patient has consented to  Procedure(s): EGD (ESOPHAGOGASTRODUODENOSCOPY) (N/A) as a surgical intervention.  The patient's history has been reviewed, patient examined, no change in status, stable for surgery.  I have reviewed the patient's chart and labs.  Questions were answered to the patient's satisfaction.     Lajuan Pila

## 2023-08-31 NOTE — Consult Note (Signed)
 Urology Consult Note   Requesting Attending Physician:  Jacqueline Billings, MD Service Providing Consult: Urology  Consulting Attending: Dr. Sherrine Hancock   Reason for Consult:  right   HPI: Jacqueline Hancock is seen in consultation for reasons noted above at the request of Jacqueline Billings, MD. Patient is an 83 year old female with PMH significant for HTN, HLD, CLL, CKD 3B, chronic hep B, tapering esophageal stricture, and recent diagnosis of B-cell lymphoma.  She was started on chemotherapy 08/23/2023.  She had worsening Hancock/V with significant fatigue following her chemotherapy infusion for 3 days.  This ultimately resulted in a near syncopal episode and presentation to [facility] emergency department for further evaluation.  CT A/P again showed an ill-defined soft tissue mass occupying the majority of the right side of her abdomen.  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  The neurology is consulted interventional radiology to discuss right-sided percutaneous nephrostomy tube, which not surprisingly they were unable to offer this.  IR has recommended that primary team discuss potential right side ureteral stent placement and protective left side ureteral stenting.  Mr. Hynson is followed by our practice and we have seen her on multiple occasions, most recently 08/02/2023.  On my arrival patient was alert but barely responsive with extremely flat affect.  We reviewed the case and plan and she did not have any further questions, other than to ask for her son.  ------------------  Assessment:  83 y.o. female with extensive right abdominal tumor burden  Recommendations: # large right abdominal mass- high grad B-cell lymphoma  Jacqueline Hancock'Hancock cancer is unfortunately spreading rapidly. There is no identifiable urinary system remaining on the right side, as the tumor burden has taken over much of her abdomen. Stent placement would nearly impossible, and as discussed in previously, it  would fail regardless. The risk outweighs the benefit at this point. There is no indication to stent her left side preventatively. Pt has pending Palliative consult.   I will not need to follow, but am available to contextualize her prognosis as it pertains to Urology if it would help the family make these difficult decisions.   Case and plan discussed with Dr. Sherrine Hancock  Past Medical History: Past Medical History:  Diagnosis Date   CLL (chronic lymphocytic leukemia) (HCC)    Dyspnea 2021   with exersion    Early cataracts, bilateral    Elevated hemoglobin A1c    Fibroids    GERD (gastroesophageal reflux disease)    Hyperlipidemia    Hypertension    Lymphoma (HCC)    Osteopenia     Past Surgical History:  Past Surgical History:  Procedure Laterality Date   ABDOMINAL HYSTERECTOMY     APPENDECTOMY     CATARACT EXTRACTION, BILATERAL Bilateral 09/2017   Dr. Celina Hancock   IR IMAGING GUIDED PORT INSERTION  08/21/2023   PAROTID GLAND TUMOR EXCISION  2012   benign   TONSILLECTOMY AND ADENOIDECTOMY      Medication: Current Facility-Administered Medications  Medication Dose Route Frequency Provider Last Rate Last Admin   Chlorhexidine Gluconate Cloth 2 % PADS 6 each  6 each Topical Daily Jacqueline Kelly, MD   6 each at 08/30/23 0935   entecavir (BARACLUDE) tablet 0.5 mg  0.5 mg Jacqueline Q Wed Snider, Adah Acron, MD   0.5 mg at 08/30/23 1005   feeding supplement (BOOST / RESOURCE BREEZE) liquid 1 Container  1 Container Jacqueline TID BM Jacqueline Blunt, MD  fluconazole  (DIFLUCAN ) IVPB 100 mg  100 mg Intravenous Q24H Hancock, Jacqueline Kishore, MD   Stopped at 08/30/23 2354   Jacqueline Hancock'Hancock butt cream   Topical BID Lama, Gagan S, MD   Given at 08/30/23 2252   heparin  injection 5,000 Units  5,000 Units Subcutaneous Q8H Kakrakandy, Jacqueline N, MD   5,000 Units at 08/30/23 1437   lactated ringers  infusion   Intravenous Continuous Jacqueline Craver, MD 75 mL/hr at 08/31/23 0853 Infusion Verify at 08/31/23 0853    midodrine (PROAMATINE) tablet 10 mg  10 mg Jacqueline TID WC Jacqueline Craver, MD   5 mg at 08/29/23 1830   mupirocin ointment (BACTROBAN) 2 % 1 Application  1 Application Nasal BID Jacqueline Blunt, MD   1 Application at 08/30/23 2252   Jacqueline care mouth rinse  15 mL Mouth Rinse PRN Jacqueline Blunt, MD       pantoprazole (PROTONIX) injection 40 mg  40 mg Intravenous Q24H Jacqueline Hancock, Jacqueline Hancock   40 mg at 08/30/23 1438   phenol (CHLORASEPTIC) mouth spray 1 spray  1 spray Mouth/Throat PRN Jacqueline Billings, MD       polyethylene glycol (MIRALAX  / GLYCOLAX ) packet 17 g  17 g Jacqueline Daily PRN Kakrakandy, Jacqueline N, MD       senna-docusate (Senokot-Hancock) tablet 2 tablet  2 tablet Jacqueline QHS Jacqueline Kelly, MD        Allergies: Allergies  Allergen Reactions   Nexium [Esomeprazole Magnesium] Other (See Comments)    Headache   Peanut Butter Flavoring Agent (Non-Screening) Itching    Social History: Social History   Tobacco Use   Smoking status: Never   Smokeless tobacco: Never  Vaping Use   Vaping status: Never Used  Substance Use Topics   Alcohol use: No   Drug use: Never    Family History Family History  Problem Relation Age of Onset   Leukemia Mother    Hypertension Father    Diabetes Sister    Heart disease Brother    Diabetes Brother    Diabetes Brother    Hypertension Son    Heart disease Son     Review of Systems  Unable to perform ROS: Other     Objective   Vital signs in last 24 hours: BP (!) 119/40   Pulse 74   Temp 98.1 F (36.7 C) (Jacqueline)   Resp 18   Ht 4\' 11"  (1.499 m)   Wt 67.2 kg   SpO2 97%   BMI 29.92 kg/m   Physical Exam: General: A&O, flat affect, barely responsive, chronically ill HEENT: [place]/AT Pulmonary: Normal work of breathing Cardiovascular: no cyanosis GU: Purewick in place   Most Recent Labs: Lab Results  Component Value Date   WBC 10.3 08/31/2023   HGB 7.9 (L) 08/31/2023   HCT 25.7 (L) 08/31/2023   PLT 119 (L) 08/31/2023    Lab Results   Component Value Date   NA 141 08/31/2023   K 4.5 08/31/2023   CL 101 08/31/2023   CO2 25 08/31/2023   BUN 113 (H) 08/31/2023   CREATININE 3.64 (H) 08/31/2023   CALCIUM 6.7 (L) 08/31/2023   MG 3.0 (H) 08/28/2023   PHOS 7.0 (H) 08/31/2023    Lab Results  Component Value Date   INR 1.3 (H) 08/02/2023     Urine Culture: @[id number] (laburin,org,[id number],r9621)@   IMAGING: No results found.  ------  Alla Ar, NP Pager: [phone]   Please contact the urology consult pager with any further questions/concerns.

## 2023-08-31 NOTE — Anesthesia Preprocedure Evaluation (Addendum)
 Anesthesia Evaluation  Patient identified by MRN, date of birth, ID band Patient awake    Reviewed: Allergy & Precautions, NPO status , Patient's Chart, lab work & pertinent test results  Airway Mallampati: II  TM Distance: >3 FB Neck ROM: Full    Dental no notable dental hx.    Pulmonary neg pulmonary ROS   Pulmonary exam normal        Cardiovascular hypertension, Pt. on medications  Rhythm:Regular Rate:Normal     Neuro/Psych negative neurological ROS  negative psych ROS   GI/Hepatic Neg liver ROS,GERD  Medicated,,  Endo/Other  negative endocrine ROS    Renal/GU Renal disease     Musculoskeletal negative musculoskeletal ROS (+)    Abdominal   Peds  Hematology  (+) Blood dyscrasia, anemia   Anesthesia Other Findings   Reproductive/Obstetrics                             Anesthesia Physical Anesthesia Plan  ASA: 2  Anesthesia Plan: MAC   Post-op Pain Management: Minimal or no pain anticipated   Induction: Intravenous  PONV Risk Score and Plan: 0 and Propofol infusion  Airway Management Planned: Natural Airway and Nasal Cannula  Additional Equipment: None  Intra-op Plan:   Post-operative Plan:   Informed Consent: I have reviewed the patients History and Physical, chart, labs and discussed the procedure including the risks, benefits and alternatives for the proposed anesthesia with the patient or authorized representative who has indicated his/her understanding and acceptance.       Plan Discussed with: CRNA  Anesthesia Plan Comments:        Anesthesia Quick Evaluation

## 2023-08-31 NOTE — Plan of Care (Signed)

## 2023-08-31 NOTE — Progress Notes (Signed)
 [facility] Progress Note  Subjective:  Pt seen in ICU Pt frail and tired 950 cc UOP yesterday Wt's unchanged Creat down 3.64 today   Vitals:   08/31/23 1310 08/31/23 1328 08/31/23 1400 08/31/23 1500  BP: (!) 125/40 (!) 130/46 (!) 133/46 (!) 125/43  Pulse: 73 73 73 77  Resp: 20 (!) 23 18 (!) 21  Temp:      TempSrc:      SpO2: 98% 97% 96% 96%  Weight:      Height:        Exam: Gen elderly, frail female, responsive, deconditioned, Ox 3 No jvd or bruits Chest clear bilat to bases RRR no MRG Abd soft protuberant, non-tender GU foley in place MS no joint effusions or deformity Ext 1+ bilat LE edema Neuro as above       Renal-related home meds: Demadex  20 mg daily as needed Losartan  50 mg daily Norvasc  5 mg daily Nadolol  20 mg daily Others: Ultram , Compazine , Zofran , Prilosec, vitamins, allopurinol    Date                             Creat               eGFR (ml/min) 2012- 2016                  0.98- 1.06 2017- 2020                  1.06- 1.35         2021- 2022                  1.07- 1.35 2023                            1.11-  1.43 2024                            1.19- 1.58 Mar 2025                     1.27- 1.61 April 2025                    1.56- 2.42 5/01- 08/09/23               2.03- 2.34        20-24 ml/min 08/15/23                        2.51 08/18/22                        2.91 5/26                             4.23 08/29/23                        4.11   UA 5/26 - prot neg, 0-5 rbc, 11-20 wbc UNa 13, UCr 69 CT 08/28/23 - showed she dominant mass centered in the right abdominal retroperitoneum is amorphous, ill-defined and difficult to on this noncontrast exam.. estimated at 12.6 by 9.6 cm on 38/2, relatively similar to 08/09/2023... this mass encompasses and obscures the right adrenal gland and the right kidney. Amorphous soft tissue extends into  the left-sided abdominal retroperitoneum.    Assessment/ Plan: AKI on CKD 4: b/l creatinine 2.0- 2.3  from early May 2025, eGFR 20-25 ml/min.  Creat here was 4.2 on admission in the setting of recent starting of chemotherapy w/ high uric acid and suspected TLS. Pt was rx'd w/ rasburicase and IVF's. Foley cath in place. BP's have been soft but not low. UA unremarkable, urine lytes c/w prerenal. CT imaging showed large R sided soft-tissue mass which encompasses and obscures the view of the R kidney. 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  AKI  mostly likely due to R hydronephrosis +/- tumor lysis syndrome, +/- hypotension. Midodrine was ^d to 10mg  tid. Creat down w/ IVF's to 3.6 today. Will decrease IVF's to 50 cc/hr. Had discussion w/ patient and family about prognosis, dialysis, etc. I think she would have a tough time doing dialysis in her condition as it is now. Palliative care consult is pending.  R-sided hydronephrosis: seen by urology here, nothing can be done about the R sided hydro, as the tumor has encapsulized the R kidney.  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 No resp issues. 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 Plan was R-mini CHOP therapy, 1st dose was 5/21. Per oncology.  CLL: dx'd in 2021.        Larry Poag MD  [facility] 08/31/2023, 3:38 PM  Recent Labs  Lab 08/29/23 0424 08/30/23 0539 08/31/23 0420  HGB 8.4* 8.0* 7.9*  ALBUMIN 2.3* 2.2* 2.2*  CALCIUM 7.1* 6.7* 6.7*  PHOS 9.1*  --  7.0*  CREATININE 4.11* 3.94* 3.64*  K 4.6 4.4 4.5   No results for input(s): "IRON", "TIBC", "FERRITIN" in the last 168 hours. Inpatient medications:  Chlorhexidine Gluconate Cloth  6 each Topical Daily   entecavir  0.5 mg Oral Q Wed   feeding supplement  1 Container Oral TID BM   Gerhardt's butt cream   Topical BID   heparin   5,000 Units Subcutaneous Q8H   midodrine  10 mg Oral TID WC   mupirocin ointment  1 Application Nasal BID    pantoprazole (PROTONIX) IV  40 mg Intravenous Q24H   senna-docusate  2 tablet Oral QHS    fluconazole  (DIFLUCAN ) IV     mouth rinse, phenol, polyethylene glycol

## 2023-08-31 NOTE — Anesthesia Procedure Notes (Signed)
 Procedure Name: MAC Date/Time: 08/31/2023 12:04 PM  Performed by: Rochell Chroman, CRNAPre-anesthesia Checklist: Patient identified, Emergency Drugs available, Suction available and Patient being monitored Patient Re-evaluated:Patient Re-evaluated prior to induction Oxygen Delivery Method: Nasal cannula

## 2023-08-31 NOTE — Progress Notes (Signed)
 Progress Note   Patient: Jacqueline Hancock:[id number] DOB: Sep 28, 1940 DOA: 08/28/2023     3 DOS: the patient was seen and examined on 08/31/2023   Brief hospital course: 83 y.o. 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  Denies any nausea vomiting diarrhea fever chills chest pain or shortness of breath.  Has abdominal fullness.  Due to persistent weakness patient was brought to the ER by the patient's family.   Dr. Salomon Cree who advised starting patient on rasburicase for possible tumor lysis syndrome and also ER physician discussed with Dr. Edson Graces who advised starting patient on bicarbonate infusion.  CT abdomen pelvis does not show anything acute but shows chronic changes.   Assessment and Plan:  Acute renal failure on chronic kidney disease stage III with concern for possible tumor lysis syndrome with effective solitary functioning kidney from mass encompassing R kidney -     Nephrology and oncology following Pt is s/p rasburicase  Continued on sodium bicarb infusion. Per Nephrology, concerns that pt would not be an ideal dialysis candidate. Have placed Palliative Care to address goals of care Cr stable 3.64 this AM Not candidate for nephrostomy tube per IR. Per Urology, not candidate for stenting   Leukocytosis -    Oncology following, who felt that the leukocytosis could be from patient receiving G-CSF after her recent chemo. B blood culture results neg. WBC down to 10.3k  Hypotension blood pressure had been soft recently Continued on midodrine 5 mg p.o. 3 times daily.   Diastolic dysfunction  2D echo done on 08/22/2023 which showed EF of 60 to 65% with grade 1 diastolic dysfunction.   held diuretics due to worsening renal function.   Large B-cell lymphoma transformation and CLL being followed by oncologist had  received recently mini CHOP regimen Oncology following    Anemia Hgb seems stable at this time Recheck CBC in AM   History of hepatitis B surface antigen positivity ID following Recs to start entecavir and then Rituxan in 1 weeks time per ID recs Dysphagia GI was consulted Pt now s/p EGD 5/29 with findings of benign appearing esophageal stenosis, dilated. Gastritis noted with non-bleeding duodenal ulcers with no stigmata of bleed, biopsied. No evidence of candidiasis GI recs for protonix 40mg  bid, carafate elixir 1g BID. Can stop diflucan       Subjective: Reports feeling better today. States sore throat feels better. Pt was seen prior to EGD  Physical Exam: Vitals:   08/31/23 1300 08/31/23 1310 08/31/23 1328 08/31/23 1400  BP: (!) 118/39 (!) 125/40 (!) 130/46 (!) 133/46  Pulse: 74 73 73 73  Resp: 20 20 (!) 23 18  Temp:      TempSrc:      SpO2: 96% 98% 97% 96%  Weight:      Height:       General exam: Conversant, in no acute distress Respiratory system: normal chest rise, clear, no audible wheezing Cardiovascular system: regular rhythm, s1-s2 Gastrointestinal system: Nondistended, nontender, pos BS Central nervous system: No seizures, no tremors Extremities: No cyanosis, no joint deformities Skin: No rashes, no pallor Psychiatry: Affect normal // no auditory hallucinations   Data Reviewed:  Labs reviewed: Na 141, K 4.5, Cr 3.64, WBC 10.3, Hgb 7.9, Plts 119  Family Communication: Pt in room, family not at bedside  Disposition: Status is: Inpatient Remains  inpatient appropriate because: severity of illness  Planned Discharge Destination: Unclear at this time    Author: Cherylle Corwin, MD 08/31/2023 3:29 PM  For on call review [email or web address].

## 2023-08-31 NOTE — Transfer of Care (Signed)
 Immediate Anesthesia Transfer of Care Note  Patient: Jacqueline Hancock  Procedure(s) Performed: EGD (ESOPHAGOGASTRODUODENOSCOPY) BALLOON DILATION  Patient Location: Endoscopy Unit  Anesthesia Type:MAC  Level of Consciousness: drowsy  Airway & Oxygen Therapy: Patient Spontanous Breathing and Patient connected to nasal cannula oxygen  Post-op Assessment: Report given to RN and Post -op Vital signs reviewed and stable  Post vital signs: Reviewed and stable  Last Vitals:  Vitals Value Taken Time  BP    Temp    Pulse 76 08/31/23 1233  Resp 19 08/31/23 1233  SpO2 89 % 08/31/23 1233  Vitals shown include unfiled device data.  Last Pain:  Vitals:   08/31/23 1121  TempSrc: Temporal  PainSc: 0-No pain         Complications: No notable events documented.

## 2023-08-31 NOTE — Anesthesia Postprocedure Evaluation (Signed)
 Anesthesia Post Note  Patient: Jacqueline Hancock  Procedure(s) Performed: EGD (ESOPHAGOGASTRODUODENOSCOPY) BALLOON DILATION     Patient location during evaluation: PACU Anesthesia Type: MAC Level of consciousness: awake and alert Pain management: pain level controlled Vital Signs Assessment: post-procedure vital signs reviewed and stable Respiratory status: spontaneous breathing, nonlabored ventilation, respiratory function stable and patient connected to nasal cannula oxygen Cardiovascular status: stable and blood pressure returned to baseline Postop Assessment: no apparent nausea or vomiting Anesthetic complications: no  No notable events documented.  Last Vitals:  Vitals:   08/31/23 1328 08/31/23 1400  BP: (!) 130/46 (!) 133/46  Pulse: 73 73  Resp: (!) 23 18  Temp:    SpO2: 97% 96%    Last Pain:  Vitals:   08/31/23 1328  TempSrc:   PainSc: 0-No pain                 Willian Harrow

## 2023-09-01 ENCOUNTER — Encounter (HOSPITAL_COMMUNITY): Payer: Self-pay | Admitting: Gastroenterology

## 2023-09-01 DIAGNOSIS — Z515 Encounter for palliative care: Secondary | ICD-10-CM

## 2023-09-01 DIAGNOSIS — N179 Acute kidney failure, unspecified: Secondary | ICD-10-CM | POA: Diagnosis not present

## 2023-09-01 DIAGNOSIS — Z7189 Other specified counseling: Secondary | ICD-10-CM

## 2023-09-01 DIAGNOSIS — R1319 Other dysphagia: Secondary | ICD-10-CM | POA: Diagnosis not present

## 2023-09-01 DIAGNOSIS — R531 Weakness: Secondary | ICD-10-CM | POA: Diagnosis not present

## 2023-09-01 DIAGNOSIS — C8338 Diffuse large B-cell lymphoma, lymph nodes of multiple sites: Secondary | ICD-10-CM | POA: Diagnosis not present

## 2023-09-01 LAB — CBC
HCT: 26 % — ABNORMAL LOW (ref 36.0–46.0)
Hemoglobin: 7.9 g/dL — ABNORMAL LOW (ref 12.0–15.0)
MCH: 27.1 pg (ref 26.0–34.0)
MCHC: 30.4 g/dL (ref 30.0–36.0)
MCV: 89.3 fL (ref 80.0–100.0)
Platelets: 118 10*3/uL — ABNORMAL LOW (ref 150–400)
RBC: 2.91 MIL/uL — ABNORMAL LOW (ref 3.87–5.11)
RDW: 13.5 % (ref 11.5–15.5)
WBC: 10.7 10*3/uL — ABNORMAL HIGH (ref 4.0–10.5)
nRBC: 0 % (ref 0.0–0.2)

## 2023-09-01 LAB — RENAL FUNCTION PANEL
Albumin: 2.1 g/dL — ABNORMAL LOW (ref 3.5–5.0)
Anion gap: 9 (ref 5–15)
BUN: 99 mg/dL — ABNORMAL HIGH (ref 8–23)
CO2: 26 mmol/L (ref 22–32)
Calcium: 6.6 mg/dL — ABNORMAL LOW (ref 8.9–10.3)
Chloride: 106 mmol/L (ref 98–111)
Creatinine, Ser: 3.38 mg/dL — ABNORMAL HIGH (ref 0.44–1.00)
GFR, Estimated: 13 mL/min — ABNORMAL LOW (ref >60)
Glucose, Bld: 90 mg/dL (ref 70–99)
Phosphorus: 5.9 mg/dL — ABNORMAL HIGH (ref 2.5–4.6)
Potassium: 4.6 mmol/L (ref 3.5–5.1)
Sodium: 141 mmol/L (ref 135–145)

## 2023-09-01 LAB — SURGICAL PATHOLOGY

## 2023-09-01 MED ORDER — SUCRALFATE 1 GM/10ML PO SUSP
1.0000 g | Freq: Two times a day (BID) | ORAL | Status: DC
  Administered 2023-09-01 – 2023-09-05 (×8): 1 g via ORAL
  Filled 2023-09-01 (×9): qty 10

## 2023-09-01 MED ORDER — ENTECAVIR 0.5 MG PO TABS
0.5000 mg | ORAL_TABLET | ORAL | Status: DC
  Administered 2023-09-02 – 2023-09-05 (×2): 0.5 mg via ORAL
  Filled 2023-09-01 (×2): qty 1

## 2023-09-01 MED ORDER — ALBUMIN HUMAN 25 % IV SOLN
25.0000 g | Freq: Once | INTRAVENOUS | Status: AC
  Administered 2023-09-01: 25 g via INTRAVENOUS
  Filled 2023-09-01: qty 100

## 2023-09-01 MED ORDER — LACTATED RINGERS IV SOLN: INTRAVENOUS | Status: DC

## 2023-09-01 MED ORDER — PANTOPRAZOLE SODIUM 40 MG IV SOLR
40.0000 mg | Freq: Two times a day (BID) | INTRAVENOUS | Status: DC
  Administered 2023-09-01 – 2023-09-04 (×8): 40 mg via INTRAVENOUS
  Filled 2023-09-01 (×8): qty 10

## 2023-09-01 MED ORDER — TRAZODONE HCL 50 MG PO TABS
50.0000 mg | ORAL_TABLET | Freq: Every evening | ORAL | Status: DC | PRN
  Administered 2023-09-02 – 2023-09-04 (×2): 50 mg via ORAL
  Filled 2023-09-01 (×3): qty 1

## 2023-09-01 MED ORDER — ONDANSETRON HCL 4 MG/2ML IJ SOLN: 4.0000 mg | Freq: Four times a day (QID) | INTRAMUSCULAR | Status: DC | PRN

## 2023-09-01 NOTE — Progress Notes (Addendum)
 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  She is on active treatment with R-miniCHOP, status post cycle 1 on 08/23/23.  Admitted 08/28/2023 due to complaints of generalized weakness, dysphagia, nausea vomiting and failure to thrive.   CLL/SLL  CD20+ CD5+ve lymphoproliferative disorder  Leukocytosis - Diagnosed 2021 when she presented with hemolytic anemia and splenomegaly. - Status post Imbruvica  started in 2022. - Patient seen in outpatient oncology office on 08/16/2023: Treatment options discussed vs hospice care.  Patient opted for R-mini CHOP received 5/21. - C2D1 due 6/11, chemo remains on HOLD.  -- WBC decreasing 10.7 today - Medical oncology/Dr. 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 Not candidate for stenting per Urology.   Concern for TLS  AKI on CKD  -Status post 1 dose rasburicase -repeat if needed - Creatinine with slight decrease 3.38 and BUN slight decrease 99. - Nephrology following  History of hepatitis B S Ag positivity  - appreciate ID eval.   -- Recommends entecavir  as less impact on kidney function.  Okay to start Rituximab in 1 week post entecavir . Recommendation is usually 1-2 wk of hep b treatment before starting rituxan.   -- Entecavir  initiation 08/30/23, continue as ordered   History of autoimmune hemolytic anemia - Has been monitored closely - HGB low but stable 7.9 - Recommend PRBC transfusion for Hgb <7.0 - Continue to monitor CBC with differential   Fatigue Generalized weakness Nausea/vomiting Failure to thrive -ongoing weakness and fatigue - Continue IV antiemetics and supportive care  Dysphagia -- patient is having significant dysphagia.  Previously seen by GI outpatient,  she was rx for esophageal candidiasis but also had possible dysmotility vs stricture that might need dilatation. -- Swallow eval done -- GI consult done, s/p EGD on 5/29.  Surg path pending.  Thrombocytopenia -- mild -- platelets 118k -- No intervention required at this time -- Continue to monitor CBC with diff   Code Status Full  Subjective:  Patient seen awake alert and oriented sitting in chair at bedside.  Reports that she feels much better.  Patient is actually smiling.  Multiple family members at bedside.  No complaints offered.    Objective:   Intake/Output Summary (Last 24 hours) at 09/01/2023 1016 Last data filed at 09/01/2023 0912 Gross per 24 hour  Intake 1342.35 ml  Output 1000 ml  Net 342.35 ml     PHYSICAL EXAMINATION: ECOG PERFORMANCE STATUS: 4 - Bedbound  Vitals:   09/01/23 0800 09/01/23 0900  BP: (!) 127/53 (!) 131/43  Pulse: 74 72  Resp: 18 20  Temp:    SpO2: 97% 99%   Filed Weights   08/31/23 0417 08/31/23 1121 09/01/23 0410  Weight: 148 lb 2.4 oz (67.2 kg) 148 lb 2.4 oz (67.2 kg) 143 lb 8.3 oz (65.1 kg)    GENERAL: alert, +frail +chronically ill-appearing  SKIN: skin color, texture, turgor are normal, no rashes or significant lesions EYES: normal, conjunctiva are pink and non-injected, sclera clear OROPHARYNX: no exudate, no erythema and lips, buccal mucosa, and tongue normal  NECK: supple, thyroid  normal size, non-tender, without nodularity LYMPH: no palpable lymphadenopathy in the cervical, axillary or inguinal LUNGS: clear to auscultation and percussion with normal breathing effort HEART: regular rate &  rhythm and no murmurs and no lower extremity edema + bilateral upper extremity edema ABDOMEN: abdomen soft, non-tender and normal bowel sounds MUSCULOSKELETAL: no cyanosis of digits and no clubbing  PSYCH: alert & oriented x 3 with fluent speech    All questions were answered. The patient knows to call the clinic with any problems,  questions or concerns.   The total time spent in the appointment was 40 minutes encounter with patient including review of chart and various tests results, discussions about plan of care and coordination of care plan  [name], NP 09/01/2023 10:16 AM    Labs Reviewed:  Lab Results  Component Value Date   WBC 10.7 (H) 09/01/2023   HGB 7.9 (L) 09/01/2023   HCT 26.0 (L) 09/01/2023   MCV 89.3 09/01/2023   PLT 118 (L) 09/01/2023   Recent Labs    08/28/23 1518 08/29/23 0424 08/30/23 0539 08/31/23 0420 09/01/23 0516  NA 136 133* 139 141 141  K 5.3* 4.6 4.4 4.5 4.6  CL 104 100 100 101 106  CO2 15* 19* 23 25 26   GLUCOSE 117* 126* 131* 96 90  BUN 132* 126* 115* 113* 99*  CREATININE 4.23* 4.11* 3.94* 3.64* 3.38*  CALCIUM 8.3* 7.1* 6.7* 6.7* 6.6*  GFRNONAA 10* 10* 11* 12* 13*  PROT 6.1* 5.1* 4.8*  --   --   ALBUMIN  2.9* 2.3* 2.2* 2.2* 2.1*  AST 24 21 21   --   --   ALT 21 17 13   --   --   ALKPHOS 123 92 84  --   --   BILITOT 0.5 0.6 0.8  --   --   BILIDIR  --  0.2  --   --   --   IBILI  --  0.4  --   --   --     Studies Reviewed:  CT ABDOMEN PELVIS WO CONTRAST Result Date: 08/28/2023 CLINICAL DATA:  Failure to thrive. 1st chemotherapy this week. Known lymphoma. * Tracking Code: BO * EXAM: CT ABDOMEN AND PELVIS WITHOUT CONTRAST TECHNIQUE: Multidetector CT imaging of the abdomen and pelvis was performed following the standard protocol without IV contrast. RADIATION DOSE REDUCTION: This exam was performed according to the departmental dose-optimization program which includes automated exposure control, adjustment of the mA and/or kV according to patient size and/or use of iterative reconstruction technique. COMPARISON:  08/09/2023 FINDINGS: Lower chest: Initial thickening at the lung bases likely due to subsegmental atelectasis and/or air trapping. Normal heart size without pericardial or pleural effusion. Hepatobiliary: Normal noncontrast appearance of the liver, gallbladder. No  gross intrahepatic biliary duct dilatation. Pancreas: Normal, without mass or ductal dilatation. Spleen: Normal in size, without focal abnormality. Adrenals/Urinary Tract: Normal left adrenal gland. No left renal calculi. Mild left-sided caliectasis is unchanged. The dominant mass centered in the right abdominal retroperitoneum is amorphous, ill-defined and difficult to on this noncontrast exam. Estimated at 12.6 by 9.6 cm on 38/2, relatively similar to 08/09/2023. This is encompasses and obscures the right adrenal gland right kidney. Amorphous soft tissue extends into the left-sided abdominal retroperitoneum including on 43/2, relatively similar. Soft tissue thickening is most apparent about the posterior bladder wall with suggestion of soft tissue fullness about the right bladder base including on 73/2. This is less masslike than on the prior exam. Stomach/Bowel: Tiny hiatal hernia. No gastric outlet obstruction. Normal colon and terminal ileum. Normal small bowel caliber. Vascular/Lymphatic: Aortic atherosclerosis. Prominent but not pathologically sized abdominal retroperitoneal nodes. Reproductive: Hysterectomy.  No adnexal  mass. Other: Pelvic floor laxity. Small volume perihepatic ascites, decreased. Musculoskeletal: No acute osseous abnormality. IMPRESSION: 1. Limited exam secondary to lack of oral or IV contrast. 2. Grossly similar appearance of amorphous, ill-defined soft tissue density mass within the abdominopelvic right-sided retroperitoneum. Extension or adjacent presumed lymphoma within the left-sided abdominal retroperitoneum. 3. No gross bowel obstruction or other acute complication. 4. Similar mild left-sided caliectasis, likely due to left abdominal retroperitoneal lymphoma. 5. Soft tissue thickening about the posterior bladder wall with decreased soft tissue fullness about the right bladder base. Suspicious for lymphomatous involvement. 6. Decrease in small volume perihepatic ascites 7. Incidental  findings, including: Aortic Atherosclerosis (ICD10-I70.0). Tiny hiatal hernia. Electronically Signed   By: Lore Rode M.D.   On: 08/28/2023 16:51   DG Chest Portable 1 View Result Date: 08/28/2023 CLINICAL DATA:  Weakness, lightheadedness, dizziness, history of lymphoma EXAM: PORTABLE CHEST 1 VIEW COMPARISON:  08/15/2023 FINDINGS: Single frontal view of the chest demonstrates right chest wall port via internal jugular approach, tip overlying superior vena cava. Cardiac silhouette is unremarkable. No acute airspace disease, effusion, or pneumothorax. No acute bony abnormalities. IMPRESSION: 1. No acute intrathoracic process. Electronically Signed   By: Bobbye Burrow M.D.   On: 08/28/2023 15:31   ECHOCARDIOGRAM COMPLETE Result Date: 08/22/2023    ECHOCARDIOGRAM REPORT   Patient Name:   Jacqueline Hancock Date of Exam: 08/22/2023 Medical Rec #:  [id number]   Height:       59.0 in Accession #:    [id number]  Weight:       140.0 lb Date of Birth:  October 29, 1940   BSA:          1.585 m Patient Age:    83 years    BP:           114/63 mmHg Patient Gender: F           HR:           78 bpm. Exam Location:  Inpatient Procedure: 2D Echo, Color Doppler and Cardiac Doppler (Both Spectral and Color            Flow Doppler were utilized during procedure). Indications:    Chemo  History:        Patient has prior history of Echocardiogram examinations, most                 recent 03/22/2023.  Sonographer:    Andrena Bang Referring Phys: [id number] Frankie Israel  Sonographer Comments: Known apical displaced papillary muscle in LV. IMPRESSIONS  1. Left ventricular ejection fraction, by estimation, is 60 to 65%. The left ventricle has normal function. The left ventricle has no regional wall motion abnormalities. Left ventricular diastolic parameters are consistent with Grade I diastolic dysfunction (impaired relaxation). The average left ventricular global longitudinal strain is -17.5 %. The global longitudinal strain is normal.  2.  Right ventricular systolic function is normal. The right ventricular size is normal.  3. The mitral valve is normal in structure. Mild to moderate mitral valve regurgitation. No evidence of mitral stenosis.  4. The aortic valve is grossly normal. Aortic valve regurgitation is trivial. No aortic stenosis is present.  5. The inferior vena cava is normal in size with greater than 50% respiratory variability, suggesting right atrial pressure of 3 mmHg. Comparison(s): No significant change from prior study. Prior images reviewed side by side. FINDINGS  Left Ventricle: Left ventricular ejection fraction, by estimation, is 60 to 65%. The left ventricle has normal function. The  left ventricle has no regional wall motion abnormalities. The average left ventricular global longitudinal strain is -17.5 %. Strain was performed and the global longitudinal strain is normal. The left ventricular internal cavity size was normal in size. There is no left ventricular hypertrophy. Left ventricular diastolic parameters are consistent with Grade I diastolic dysfunction (impaired relaxation). Normal left ventricular filling pressure. Right Ventricle: The right ventricular size is normal. No increase in right ventricular wall thickness. Right ventricular systolic function is normal. Left Atrium: Left atrial size was normal in size. Right Atrium: Right atrial size was normal in size. Pericardium: There is no evidence of pericardial effusion. Mitral Valve: The mitral valve is normal in structure. Mild to moderate mitral valve regurgitation, with centrally-directed jet. No evidence of mitral valve stenosis. Tricuspid Valve: The tricuspid valve is normal in structure. Tricuspid valve regurgitation is mild . No evidence of tricuspid stenosis. Aortic Valve: The aortic valve is grossly normal. Aortic valve regurgitation is trivial. No aortic stenosis is present. Aortic valve mean gradient measures 4.0 mmHg. Aortic valve peak gradient measures  10.9 mmHg. Aortic valve area, by VTI measures 1.86 cm. Pulmonic Valve: The pulmonic valve was normal in structure. Pulmonic valve regurgitation is not visualized. No evidence of pulmonic stenosis. Aorta: The aortic root is normal in size and structure. Venous: The inferior vena cava is normal in size with greater than 50% respiratory variability, suggesting right atrial pressure of 3 mmHg. IAS/Shunts: No atrial level shunt detected by color flow Doppler.  LEFT VENTRICLE PLAX 2D LVIDd:         3.90 cm     Diastology LVIDs:         2.20 cm     LV e' medial:    8.49 cm/s LV PW:         1.00 cm     LV E/e' medial:  7.9 LV IVS:        0.90 cm     LV e' lateral:   10.60 cm/s LVOT diam:     1.70 cm     LV E/e' lateral: 6.3 LV SV:         46 LV SV Index:   29          2D Longitudinal Strain LVOT Area:     2.27 cm    2D Strain GLS Avg:     -17.5 %  LV Volumes (MOD) LV vol d, MOD A2C: 49.6 ml LV vol d, MOD A4C: 54.7 ml LV vol s, MOD A2C: 16.9 ml LV vol s, MOD A4C: 24.9 ml LV SV MOD A2C:     32.7 ml LV SV MOD A4C:     54.7 ml LV SV MOD BP:      32.7 ml RIGHT VENTRICLE RV S prime:     16.90 cm/s TAPSE (M-mode): 2.0 cm LEFT ATRIUM             Index LA diam:        3.10 cm 1.96 cm/m LA Vol (A2C):   22.3 ml 14.07 ml/m LA Vol (A4C):   24.7 ml 15.58 ml/m LA Biplane Vol: 24.1 ml 15.21 ml/m  AORTIC VALVE AV Area (Vmax):    1.69 cm AV Area (Vmean):   1.85 cm AV Area (VTI):     1.86 cm AV Vmax:           165.00 cm/s AV Vmean:          86.200 cm/s AV VTI:  0.247 m AV Peak Grad:      10.9 mmHg AV Mean Grad:      4.0 mmHg LVOT Vmax:         123.00 cm/s LVOT Vmean:        70.300 cm/s LVOT VTI:          0.202 m LVOT/AV VTI ratio: 0.82 MITRAL VALVE               TRICUSPID VALVE MV Area (PHT): 4.33 cm    TR Peak grad:   33.4 mmHg MV Decel Time: 175 msec    TR Vmax:        289.00 cm/s MR Peak grad: 147.4 mmHg MR Mean grad: 111.0 mmHg   SHUNTS MR Vmax:      607.00 cm/s  Systemic VTI:  0.20 m MR Vmean:     500.0 cm/s    Systemic Diam: 1.70 cm MV E velocity: 66.70 cm/s MV A velocity: 99.00 cm/s MV E/A ratio:  0.67 Mihai Croitoru MD Electronically signed by Luana Rumple MD Signature Date/Time: 08/22/2023/2:33:18 PM    Final    IR IMAGING GUIDED PORT INSERTION Result Date: 08/21/2023 CLINICAL DATA:  B-CELL LYMPHOMA, ACCESS FOR CHEMOTHERAPY EXAM: RIGHT INTERNAL JUGULAR SINGLE LUMEN POWER PORT CATHETER INSERTION Date:  08/21/2023 08/21/2023 9:33 am Radiologist:  Melven Stable. Alyssa Jumper, MD Guidance:  Ultrasound and fluoroscopic MEDICATIONS: 1% lidocaine  local with epinephrine  ANESTHESIA/SEDATION: Versed  1.0 mg IV; Fentanyl  50 mcg IV; Moderate Sedation Time:  28 minute The patient was continuously monitored during the procedure by the interventional radiology nurse under my direct supervision. FLUOROSCOPY: One minutes, 24 seconds (15 mGy) COMPLICATIONS: None immediate. CONTRAST:  None. PROCEDURE: Informed consent was obtained from the patient following explanation of the procedure, risks, benefits and alternatives. The patient understands, agrees and consents for the procedure. All questions were addressed. A time out was performed. Maximal barrier sterile technique utilized including caps, mask, sterile gowns, sterile gloves, large sterile drape, hand hygiene, and 2% chlorhexidine  scrub. Under sterile conditions and local anesthesia, right internal JV micropuncture venous access was performed. Access was performed with ultrasound. Images were obtained for documentation of the patent right internal jugular vein. A guide wire was inserted followed by a transitional dilator. This allowed insertion of a guide wire and catheter into the IVC. Measurements were obtained from the SVC / RA junction back to the right IJ venotomy site. In the right infraclavicular chest, a subcutaneous pocket was created over the second anterior rib. This was done under sterile conditions and local anesthesia. 1% lidocaine  with epinephrine  was utilized for this. A 2.5  cm incision was made in the skin. Blunt dissection was performed to create a subcutaneous pocket over the right pectoralis major muscle. The pocket was flushed with saline vigorously. There was adequate hemostasis. The port catheter was assembled and checked for leakage. The port catheter was secured in the pocket with two retention sutures. The tubing was tunneled subcutaneously to the right venotomy site and inserted into the SVC/RA junction through a valved peel-away sheath. Position was confirmed with fluoroscopy. Images were obtained for documentation. The patient tolerated the procedure well. No immediate complications. Incisions were closed in a two layer fashion with 4 - 0 Vicryl suture. Dermabond was applied to the skin. The port catheter was accessed, blood was aspirated followed by saline and heparin  flushes. Needle was removed. A dry sterile dressing was applied. IMPRESSION: Ultrasound and fluoroscopically guided right internal jugular single lumen power port catheter insertion. Tip in the  SVC/RA junction. Catheter ready for use. Electronically Signed   By: Melven Stable.  Shick M.D.   On: 08/21/2023 09:57   DG ESOPHAGUS W SINGLE CM (SOL OR THIN BA) Addendum Date: 08/19/2023 ADDENDUM REPORT: 08/19/2023 08:35 ADDENDUM: Addendum for clarification of body of report. Hiatus hernia without evidence of stricture. Electronically Signed   By: Art Largo M.D.   On: 08/19/2023 08:35   Result Date: 08/19/2023 CLINICAL DATA:  dysphagia 83 year old female with dysphagia and globus sensation for esophagram. EXAM: ESOPHAGUS/BARIUM SWALLOW/TABLET STUDY TECHNIQUE: Single contrast examination was performed using thin liquid barium. This exam was performed by Abigail C. Emerson, PA-C, and was supervised and interpreted by Dr. Darylene Epley. FLUOROSCOPY: Radiation Exposure Index and estimated peak skin dose (PSD); Reference air kerma (RAK), 29.4 mGy. Kerma-area product (KAP), 409.9 uGy*m. COMPARISON:  CT AP, 12/02/2022 FINDINGS:  Swallowing: Appears normal. No vestibular penetration or aspiration seen. Pharynx: Unremarkable. Esophagus: No mucosal lesions. Smooth, tapering stricture above gastroesophageal junction. Esophageal motility: Moderate esophageal dysmotility visualized with resultant contrast stasis and delayed emptying into the stomach. Hiatal Hernia: Mild hiatal hernia. Gastroesophageal reflux: None visualized, even with provacative maneuvers. Ingested 13mm barium tablet: Became stuck just above the gastroesophageal junction, where the tapering stricture is visualized. Waited 3 minutes with subsequent swallows of water and barium, tablet remained stuck. Pt informed the tablet will dissolve on its own. Other: None. IMPRESSION: Suboptimal evaluation, secondary to patient's inability to remain upright. 1. Small hiatus hernia. 2. 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 Performed By Lorinda Root, PA-C and supervised by Art Largo, MD Electronically Signed: By: Art Largo M.D. On: 08/18/2023 09:35   DG Chest 2 View Result Date: 08/15/2023 CLINICAL DATA:  Shortness of breath. EXAM: CHEST - 2 VIEW COMPARISON:  Aug 09, 2023. FINDINGS: The heart size and mediastinal contours are within normal limits. Both lungs are clear. The visualized skeletal structures are unremarkable. IMPRESSION: No active cardiopulmonary disease. Electronically Signed   By: Rosalene Colon M.D.   On: 08/15/2023 11:25   CT ABDOMEN PELVIS WO CONTRAST Result Date: 08/09/2023 CLINICAL DATA:  Abdominal pain no known history of lymphoma and recently positive biopsy EXAM: CT ABDOMEN AND PELVIS WITHOUT CONTRAST TECHNIQUE: Multidetector CT imaging of the abdomen and pelvis was performed following the standard protocol without IV contrast. RADIATION DOSE REDUCTION: This exam was performed according to the departmental dose-optimization program which includes automated exposure control, adjustment of the mA  and/or kV according to patient size and/or use of iterative reconstruction technique. COMPARISON:  08/01/2023 FINDINGS: Lower chest: Previously seen patchy ground-glass opacities are again seen but improved when compared with the prior study. Hepatobiliary: Persistent perihepatic fluid is noted. The gallbladder is within normal limits. Pancreas: Unremarkable. No pancreatic ductal dilatation or surrounding inflammatory changes. Spleen: Normal in size without focal abnormality. Mild perisplenic fluid is noted. Adrenals/Urinary Tract: Left adrenal gland is within normal limits. Left kidney shows no acute abnormality. No obstructive changes are seen.The right kidney is envelop by a large soft tissue mass stable in appearance from the recent exam as well as a recent MRI. 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 The surrounding soft tissues extend inferiorly similar to that seen on the prior exam. The bladder is decompressed. Stomach/Bowel: No obstructive or inflammatory changes of the colon are seen. The appendix is not well visualized. Small bowel and stomach are unremarkable. Vascular/Lymphatic: Aortic atherosclerosis. No enlarged abdominal or pelvic lymph  nodes. Reproductive: Uterus has been surgically removed. Other: No abdominal wall hernia or abnormality. No abdominopelvic ascites. Musculoskeletal: No acute bony abnormality is noted. IMPRESSION: Stable appearing soft tissue mass lesion in the right abdomen envelop ping the right kidney with mild hydronephrosis as well as extending superiorly towards the liver and inferiorly into the pelvis. 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 Mild free fluid is noted within the abdomen stable from the prior study. No new focal abnormality is seen. Electronically Signed   By: Violeta Grey M.D.   On: 08/09/2023 22:51   DG Chest 2 View Result Date: 08/09/2023 CLINICAL DATA:   Shortness of breath EXAM: CHEST - 2 VIEW COMPARISON:  08/01/2023 FINDINGS: Heart mediastinal contours within normal limits. Questionable ill-defined patchy lower lobe opacities. No visible effusions or pneumothorax. No acute bony abnormality. IMPRESSION: Questionable vague lower lobe opacities, possible atelectasis or pneumonia. Electronically Signed   By: Janeece Mechanic M.D.   On: 08/09/2023 21:14   CT ABDOMINAL MASS BIOPSY Result Date: 08/04/2023 INDICATION: Retroperitoneal mass.  Personal history of lymphoma. EXAM: CT-guided core biopsy of right retroperitoneal mass MEDICATIONS: None. ANESTHESIA/SEDATION: Moderate (conscious) sedation was employed during this procedure. A total of Versed  1 mg and Fentanyl  50 mcg was administered intravenously by the radiology nurse. Total intra-service moderate Sedation Time: 17 minutes. The patient's level of consciousness and vital signs were monitored continuously by radiology nursing throughout the procedure under my direct supervision. COMPLICATIONS: None immediate. PROCEDURE: Informed written consent was obtained from the patient after a thorough discussion of the procedural risks, benefits and alternatives. All questions were addressed. Maximal Sterile Barrier Technique was utilized including caps, mask, sterile gowns, sterile gloves, sterile drape, hand hygiene and skin antiseptic. A timeout was performed prior to the initiation of the procedure. CT imaging was performed on the large right retroperitoneal mass was identified. A suitable skin entry site was selected and marked. Local anesthesia was attained by infiltration with 1% lidocaine . A small dermatotomy was made. Under intermittent CT guidance, a 17 gauge introducer needle was carefully advanced into the posterolateral aspect of the mass. Multiple 18 gauge core biopsies were then obtained coaxially using the bio Pince automated biopsy device. Biopsy specimens were placed in saline and delivered to pathology for  further analysis. IMPRESSION: CT-guided core biopsy of right retroperitoneal mass. Electronically Signed   By: Fernando Hoyer M.D.   On: 08/04/2023 12:57   ADDENDUM  .Patient was Personally and independently interviewed, examined and relevant elements of the history of present illness were reviewed in details and an assessment and plan was created. All elements of the patient's history of present illness , assessment and plan were discussed in details with Berlinda Breeze NP. The above documentation reflects our combined findings assessment and plan. Patient continues to gradually improve with increasing urine output and improvement of her creatinine down to 3.3.  Patient had a repeat EGD with dilatation for benign stricture which appears to have helped her swallowing.  No esophageal candidiasis noted and therefore fluconazole  was held with the plan to restarted p.o. with her next chemotherapy for prophylaxis. EGD biopsy showed dose of gastroduodenitis.  Should be on PPI. Nutritional therapy and physical therapy input.  Belem Hintze MD MS

## 2023-09-01 NOTE — Progress Notes (Signed)
 PHARMACY NOTE:  ANTIMICROBIAL RENAL DOSAGE ADJUSTMENT  Current antimicrobial regimen includes a mismatch between antimicrobial dosage and estimated renal function.  As per policy approved by the Pharmacy & Therapeutics and Medical Executive Committees, the antimicrobial dosage will be adjusted accordingly.  Current antimicrobial dosage:  Entecavir 0.5 mg once a week  Indication: Hepatitis B  Renal Function:  Estimated Creatinine Clearance: 10.4 mL/min (A) (by C-G formula based on SCr of 3.38 mg/dL (H)). []      On intermittent HD, scheduled: []      On CRRT    Antimicrobial dosage has been changed to:  Entecavir 0.5 mg every 72h  Additional comments:  Thank you for allowing pharmacy to be a part of this patient's care.  Garland Junk, PharmD, BCPS, BCIDP Infectious Diseases Clinical Pharmacist 09/01/2023 8:04 AM   **Pharmacist phone directory can now be found on [email or web address] ([email or web address]).  Listed under [facility].

## 2023-09-01 NOTE — Progress Notes (Addendum)
 [facility] Progress Note  Subjective:  Pt seen in ICU UOP improving still , 1350 cc yesterday Creat down to 3.3 today  Vitals:   09/01/23 1100 09/01/23 1116 09/01/23 1200 09/01/23 1300  BP: (!) 104/43  (!) 126/49 (!) 117/46  Pulse: 71  74 69  Resp: 20  20 19   Temp:  98.6 F (37 C)    TempSrc:  Oral    SpO2: 99%  98% 97%  Weight:      Height:        Exam: Gen elderly, deconditioned, more alert/awake today No jvd or bruits Chest clear bilat to bases RRR no MRG Abd soft protuberant, non-tender GU foley in place MS no joint effusions or deformity Ext 1+ bilat LE edema Neuro as above       Renal-related home meds: Demadex  20 mg daily as needed Losartan  50 mg daily Norvasc  5 mg daily Nadolol  20 mg daily Others: Ultram , Compazine , Zofran , Prilosec, vitamins, allopurinol    Date                             Creat               eGFR (ml/min) 2012- 2016                  0.98- 1.06 2017- 2020                  1.06- 1.35         2021- 2022                  1.07- 1.35 2023                            1.11-  1.43 2024                            1.19- 1.58 Mar 2025                     1.27- 1.61 April 2025                    1.56- 2.42 5/01- 08/09/23               2.03- 2.34        20-24 ml/min 08/15/23                        2.51 08/18/22                        2.91 5/26                             4.23 08/29/23                        4.11   UA 5/26 - prot neg, 0-5 rbc, 11-20 wbc UNa 13, UCr 69 CT 08/28/23 - showed she dominant mass centered in the right abdominal retroperitoneum is amorphous, ill-defined and difficult to on this noncontrast exam.. estimated at 12.6 by 9.6 cm on 38/2, relatively similar to 08/09/2023... this mass encompasses and obscures the right adrenal gland and the right kidney. Amorphous soft tissue extends into the left-sided abdominal retroperitoneum.  Assessment/ Plan: AKI on CKD 4: b/l creatinine 2.0- 2.3 from early May 2025, eGFR 20-25  ml/min.  Creat here was 4.2 on admission in the setting of recent starting of chemotherapy w/ high uric acid and suspected TLS. Pt was rx'd w/ rasburicase  and IVF's. Foley cath in place. BP's have been soft but not low. UA unremarkable, urine lytes c/w prerenal. CT imaging showed large R sided soft-tissue mass which encapsulates the R kidney.  AKI likely due to R hydronephrosis +/- tumor lysis syndrome +/- intravasc vol depletion +/- hypotension. Midodrine  was ^d to 10mg  tid and IVF"s were given. Creat has improved over several days down to 3.3 today. Cont IVF"s at 50 cc/hr.  R-sided hydronephrosis: seen by urology here, nothing can be done about the R sided hydro, as the tumor has encapsulized the R kidney. Suspect the R kidney is no longer functioning.  Hypotension: on midodrine  10 mg tid started here. BP's are low-normal here 110/70 range.  Volume: chronic LE edema is likely localized from intra-abdominal malignancy obstructing lymphatic flow. No resp issues. 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 Plan per ONC was R-mini CHOP therapy, 1st dose was 5/21.  CLL: dx'd in 2021.  GOC: palliative care meeting held today. Pt is full code, full scope of care.        Larry Poag MD  [facility] 09/01/2023, 2:06 PM  Recent Labs  Lab 08/31/23 0420 09/01/23 0516  HGB 7.9* 7.9*  ALBUMIN  2.2* 2.1*  CALCIUM 6.7* 6.6*  PHOS 7.0* 5.9*  CREATININE 3.64* 3.38*  K 4.5 4.6   No results for input(s): "IRON", "TIBC", "FERRITIN" in the last 168 hours. Inpatient medications:  Chlorhexidine  Gluconate Cloth  6 each Topical Daily   [START ON 09/02/2023] entecavir   0.5 mg Oral Q72H   feeding supplement  1 Container Oral TID BM   Gerhardt's butt cream   Topical BID   heparin   5,000 Units Subcutaneous Q8H   midodrine   10 mg Oral TID WC   mupirocin  ointment  1 Application Nasal BID   pantoprazole  (PROTONIX ) IV  40 mg Intravenous Q12H   senna-docusate  2 tablet  Oral QHS   sucralfate   1 g Oral BID    lactated ringers  50 mL/hr at 09/01/23 0642   mouth rinse, phenol, polyethylene glycol

## 2023-09-01 NOTE — Progress Notes (Signed)
 ID PROGRESS NOTE  Chronic hep b - currently on entecavir. Will adjust dose to Q72hr since her renal function is improving.  Will see back on Monday.  Gerold Kos Levern Reader MD MPH [facility] [phone]

## 2023-09-01 NOTE — Plan of Care (Signed)
  Problem: Education: Goal: Knowledge of General Education information will improve Description: Including pain rating scale, medication(s)/side effects and non-pharmacologic comfort measures Outcome: Progressing   Problem: Clinical Measurements: Goal: Ability to maintain clinical measurements within normal limits will improve Outcome: Progressing Goal: Diagnostic test results will improve Outcome: Progressing   Problem: Coping: Goal: Level of anxiety will decrease Outcome: Progressing   Problem: Elimination: Goal: Will not experience complications related to bowel motility Outcome: Progressing   Problem: Safety: Goal: Ability to remain free from injury will improve Outcome: Progressing   Problem: Skin Integrity: Goal: Risk for impaired skin integrity will decrease Outcome: Progressing

## 2023-09-01 NOTE — TOC Progression Note (Signed)
 Transition of Care [facility]) - Progression Note    Patient Details  Name: Jacqueline Hancock MRN: [id number] Date of Birth: June 26, 1940  [facility] CM/SW Contact  Tessie Fila, RN Phone Number: 09/01/2023, 4:14 PM  Clinical Narrative:    PT/OT have been consulted, awaiting recommendations. TOC continuing to follow.   Expected Discharge Plan: Home/Self Care Barriers to Discharge: Continued Medical Work up  Expected Discharge Plan and Services In-house Referral: NA Discharge Planning Services: CM Consult Post Acute Care Choice: NA Living arrangements for the past 2 months: Single Family Home                 DME Arranged: N/A DME Agency: NA       HH Arranged: NA HH Agency: NA         Social Determinants of Health (SDOH) Interventions SDOH Screenings   Food Insecurity: No Food Insecurity (08/29/2023)  Housing: Low Risk  (08/29/2023)  Transportation Needs: No Transportation Needs (08/29/2023)  Utilities: Not At Risk (08/29/2023)  Recent Concern: Utilities - At Risk (08/02/2023)  Depression (PHQ2-9): Low Risk  (07/18/2023)  Social Connections: Moderately Integrated (08/29/2023)  Tobacco Use: Low Risk  (08/31/2023)  Health Literacy: Adequate Health Literacy (02/16/2023)    Readmission Risk Interventions    08/30/2023   11:03 AM 08/02/2023   11:12 AM  Readmission Risk Prevention Plan  Transportation Screening Complete Complete  PCP or Specialist Appt within 5-7 Days  Complete  Home Care Screening  Complete  Medication Review (RN CM)  Complete  Medication Review (RN Care Manager) Complete   PCP or Specialist appointment within 3-5 days of discharge Complete   HRI or Home Care Consult Complete   SW Recovery Care/Counseling Consult Complete   Palliative Care Screening Complete   Skilled Nursing Facility Complete

## 2023-09-01 NOTE — Consult Note (Signed)
 Consultation Note Date: 09/01/2023   Patient Name: Jacqueline Hancock  DOB: 07-09-1940  MRN: [id number]  Age / Sex: 83 y.o., female  PCP: Ngetich, Elijio Guadel[place], NP Referring Physician: Oral Billings, MD  Reason for Consultation: Establishing goals of care  HPI/Patient Profile: 83 y.o. female admitted on 08/28/2023.   Clinical Assessment and Goals of Care: 83 year old lady who lives at home with her son, history of CLL with recent biopsy showing transformation to large B-cell lymphoma, patient sees Dr. Salomon Cree at [facility], was started on chemotherapy mini CHOP regimen. Patient reportedly was feeling weak and had poor appetite abdominal fullness and was brought into the emergency room by her family for further evaluation. Upon consultation with nephrology and oncology, patient was started on rasburicase  for possible tumor lysis syndrome as well as bicarbonate infusion.  Patient remains admitted to hospital medicine service.  Palliative consult for CODE STATUS and for goals of care discussions has been requested. Chart reviewed, patient seen and examined, call placed and discussed with the son Jacqueline Hancock as well.  Discussed with bedside nursing colleagues as well as have corresponded with [facility] MD colleague. Patient is awake alert sitting up in the chair.  Monitor noted.  Patient mostly has a flat affect and avoids eye contact.  She answers a few questions appropriately otherwise does not verbalize much, does not share much details.  Discussed with patient about her serious underlying illness of recent diagnosis of large B-cell lymphoma, recent chemotherapy regimen, current acute problems pertaining to this hospitalization such as acute on chronic renal failure.  CODE STATUS and broad goals of care discussions attempted.  Patient simply states that her son Jacqueline Hancock who lives with her keeps comfortable with all of her medical  information. Palliative medicine is specialized medical care for people living with serious illness. It focuses on providing relief from the symptoms and stress of a serious illness. The goal is to improve quality of life for both the patient and the family. Goals of care: Broad aims of medical therapy in relation to the patient's values and preferences. Our aim is to provide medical care aimed at enabling patients to achieve the goals that matter most to them, given the circumstances of their particular medical situation and their constraints.    NEXT OF KIN Lives at home with her son Jacqueline Hancock.  Has 2 other sons 1 lives locally and 1 lives out of state.  SUMMARY OF RECOMMENDATIONS   CODE STATUS and broad goals of care discussions undertaken with the patient initially and subsequently call placed and was able to reach patient's son Jacqueline Hancock.  Discussed with both patient as well as the son about patient's large B-cell lymphoma, recent chemotherapy regimen, recent acute functional status decline, acute on chronic renal failure.  Of note, patient also with dysphagia for which GI was consulted and is status post EGD on 5-29 with findings pertaining to esophageal stenosis status post dilation, nonbleeding duodenal ulcers and gastritis was also noted.  Attempted to understand the patient's goals wishes  and values.  Patient defers to her son Jacqueline Hancock, son Jacqueline Hancock states that he would like to continue with full code full scope care.  He is hopeful that the patient can stabilize and improve well, he asks for home health care hospital bed and other home nursing care support as much as possible towards the end of this hospitalization.  He wishes to continue follow-up with medical oncology and the hopes that the patient's renal function will improve.  Discussed gently but frankly about serious underlying nature of the patient's condition and the concern that she might not be a dialysis candidate.  Patient defers to son and son  states that that they would be accepting of any and all life maintaining and life-prolonging therapies if offered.  Offered active listening and supportive empathic presence and other therapeutic communication techniques were also employed at the time of this initial encounter.  Palliative services will continue to monitor hospital course and overall disease trajectory of illness/continue further goals of care discussions. Thank you for the consult.  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 The following aspects are pertinent.  Past Medical History:  Diagnosis Date   CLL (chronic lymphocytic leukemia) (HCC)    Dyspnea 2021   with exersion    Early cataracts, bilateral    Elevated hemoglobin A1c    Fibroids    GERD (gastroesophageal reflux disease)    Hyperlipidemia    Hypertension    Lymphoma (HCC)    Osteopenia    Social History   Socioeconomic History   Marital status: Widowed    Spouse name: Not on file   Number of children: 3   Years of education: Not on file   Highest  education level: Not on file  Occupational History   Occupation: retired  Tobacco Use   Smoking status: Never   Smokeless tobacco: Never  Vaping Use   Vaping status: Never Used  Substance and Sexual Activity   Alcohol use: No   Drug use: Never   Sexual activity: Not on file  Other Topics Concern   Not on file  Social History Narrative   Not on file   Social Drivers of Health   Financial Resource Strain: Not on file  Food Insecurity: No Food Insecurity (08/29/2023)   Hunger Vital Sign    Worried About Running Out of Food in the Last Year: Never true    Ran Out of Food in the Last Year: Never true  Transportation Needs: No Transportation Needs (08/29/2023)   PRAPARE - Administrator, Civil Service (Medical): No    Lack of Transportation (Non-Medical): No  Physical Activity: Not on file  Stress: Not on file  Social Connections: Moderately Integrated (08/29/2023)  Social Connection and Isolation Panel [NHANES]    Frequency of Communication with Friends and Family: More than three times a week    Frequency of Social Gatherings with Friends and Family: Three times a week    Attends Religious Services: More than 4 times per year    Active Member of Clubs or Organizations: Yes    Attends Banker Meetings: More than 4 times per year    Marital Status: Widowed   Family History  Problem Relation Age of Onset   Leukemia Mother    Hypertension Father    Diabetes Sister    Heart disease Brother    Diabetes Brother    Diabetes Brother    Hypertension Son    Heart disease Son    Scheduled Meds:  Chlorhexidine Gluconate Cloth  6 each Topical Daily   [START ON 09/02/2023] entecavir  0.5 mg Oral Q72H   feeding supplement  1 Container Oral TID BM   Gerhardt's butt cream   Topical BID   heparin   5,000 Units Subcutaneous Q8H   midodrine  10 mg Oral TID WC   mupirocin ointment  1 Application Nasal BID   pantoprazole (PROTONIX) IV  40 mg Intravenous Q24H    senna-docusate  2 tablet Oral QHS   Continuous Infusions:  fluconazole  (DIFLUCAN ) IV Stopped (09/01/23 0022)   lactated ringers  50 mL/hr at 09/01/23 0642   PRN Meds:.mouth rinse, phenol, polyethylene glycol Medications Prior to Admission:  Prior to Admission medications   Medication Sig Start Date End Date Taking? Authorizing Provider  allopurinol  (ZYLOPRIM ) 100 MG tablet TAKE 1 TABLET(100 MG) BY MOUTH TWICE DAILY 08/21/23  Yes Frankie Israel, MD  amLODipine  (NORVASC ) 5 MG tablet Take 1 tablet (5 mg total) by mouth daily. 06/14/23  Yes Ngetich, Dinah C, NP  B Complex Vitamins (VITAMIN B-COMPLEX PO) Take 1 tablet by mouth daily.   Yes [provider]  furosemide  (LASIX ) 20 MG tablet TAKE 1 TABLET(20 MG) BY MOUTH DAILY Patient taking differently: Take 20 mg by mouth daily. 08/21/23  Yes Frankie Israel, MD  loratadine (CLARITIN) 10 MG tablet Take 10 mg by mouth daily.   Yes [provider]  nadolol  (CORGARD ) 20 MG tablet TAKE 1 TABLET BY MOUTH DAILY FOR BLOOD PRESSURE OR TREMORS Patient taking differently: Take 20 mg by mouth daily. 07/27/23  Yes Ngetich, Dinah C, NP  omeprazole  (PRILOSEC) 20 MG capsule Open capsule and sprinkle on food once daily Patient taking differently: Take 20 mg by mouth daily. 08/09/23  Yes Edmonia Gottron, PA-C  ondansetron  (ZOFRAN ) 8 MG tablet Take 1 tablet (8 mg total) by mouth every 8 (eight) hours as needed for nausea or vomiting. Start on the third day after cyclophosphamide chemotherapy. Patient taking differently: Take 8 mg by mouth daily as needed for nausea or vomiting. 08/18/23  Yes Kale, Gautam Kishore, MD  polyethylene glycol (MIRALAX  / GLYCOLAX ) 17 g packet Take 17 g by mouth daily as needed for mild constipation or moderate constipation. Also available OTC 06/18/23  Yes Rai, Ripudeep K, MD  prochlorperazine  (COMPAZINE ) 10 MG tablet Take 1 tablet (10 mg total) by mouth every 6 (six) hours as needed for nausea or vomiting. 08/18/23  Yes  Frankie Israel, MD  senna-docusate (SENOKOT-S) 8.6-50 MG tablet Take 2 tablets by mouth at bedtime. For constipation 06/18/23  Yes Rai, Ripudeep K, MD  torsemide  (DEMADEX ) 20 MG tablet Take 20 mg by mouth daily as needed (fluid). 08/17/23  Yes [provider]  traMADol  (ULTRAM ) 50 MG tablet Take 1 tablet (50 mg total) by mouth every 8 (eight) hours as needed for moderate pain (pain score 4-6) or severe pain (pain score 7-10) (pain). Patient taking differently: Take 50 mg by mouth daily as needed for moderate pain (pain score 4-6) or severe pain (pain score 7-10) (pain). 06/18/23  Yes Rai, Ripudeep K, MD  amoxicillin -clavulanate (AUGMENTIN ) 875-125 MG tablet Take 1 tablet by mouth every 12 (twelve) hours. Patient not taking: Reported on 08/29/2023 08/09/23   Kingsley, Victoria K, DO  azithromycin  (ZITHROMAX ) 250 MG tablet Take 1 tablet (250 mg total) by mouth daily. Starting tomorrow, take 1 every day until finished. Patient not taking: Reported on 08/29/2023 08/09/23   Kingsley, Victoria K, DO  lidocaine -prilocaine  (EMLA ) cream Apply to affected area once Patient not taking: Reported on 08/29/2023 08/18/23   Frankie Israel, MD  losartan  (COZAAR ) 50 MG tablet Take 50 mg by mouth daily. Patient not taking: Reported on 08/29/2023    [provider]  nystatin  (MYCOSTATIN ) 100000 UNIT/ML suspension Take 5 mLs (500,000 Units total) by mouth 4 (four) times daily. Patient not taking: Reported on 08/29/2023 08/09/23   Edmonia Gottron, PA-C  gabapentin  (NEURONTIN ) 100 MG capsule Take 1 capsule 3 x /day for Neuropathy Pain Patient not taking: Reported on 01/25/2020 08/02/19 01/25/20  Vangie Genet, MD   Allergies  Allergen Reactions   Nexium [Esomeprazole Magnesium] Other (See Comments)    Headache   Peanut Butter Flavoring Agent (Non-Screening) Itching   Review of Systems Patient complains of swelling in both upper extremities and legs Physical Exam Awake alert Sitting up in a  chair Monitor noted Regular work of breathing Flat affect, avoids eye contact Has 1-2+ upper and lower extremity edema  Vital Signs: BP (!) 131/43 (BP Location: Right Arm)   Pulse 72   Temp 98.6 F (37 C) (Oral)   Resp 20   Ht 4\' 11"  (1.499 m)   Wt 65.1 kg   SpO2 99%   BMI 28.99 kg/m  Pain Scale: 0-10   Pain Score: 0-No pain   SpO2: SpO2: 99 % O2 Device:SpO2: 99 % O2 Flow Rate: .O2 Flow Rate (L/min): 3 L/min  IO: Intake/output summary:  Intake/Output Summary (Last 24 hours) at 09/01/2023 1144 Last data filed at 09/01/2023 0912 Gross per 24 hour  Intake 1342.35 ml  Output 1000 ml  Net 342.35 ml    LBM: Last BM Date : 09/01/23 Baseline Weight: Weight: 63.6 kg Most recent weight: Weight: 65.1 kg     Palliative Assessment/Data:   PPS 50%  Time In:  10 Time Out:  11 Time Total:  60 Greater than 50%  of this time was spent counseling and coordinating care related to the above assessment and plan.  Signed by: Lujean Sake, MD   Please contact Palliative Medicine Team phone at [phone] for questions and concerns.  For individual provider: See Tilford Foley

## 2023-09-01 NOTE — Progress Notes (Signed)
 Progress Note   Patient: Jacqueline Hancock:[id number] DOB: 01-Apr-1941 DOA: 08/28/2023     4 DOS: the patient was seen and examined on 09/01/2023   Brief hospital course: 83 y.o. 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  Denies any nausea vomiting diarrhea fever chills chest pain or shortness of breath.  Has abdominal fullness.  Due to persistent weakness patient was brought to the ER by the patient's family.   Dr. Salomon Cree who advised starting patient on rasburicase for possible tumor lysis syndrome and also ER physician discussed with Dr. Edson Graces who advised starting patient on bicarbonate infusion.  CT abdomen pelvis does not show anything acute but shows chronic changes.   Assessment and Plan:  Acute renal failure on chronic kidney disease stage III with concern for possible tumor lysis syndrome with effective solitary functioning kidney from mass encompassing R kidney -     Nephrology and oncology following Pt is s/p rasburicase  Pt was given sodium bicarb infusion. Per Nephrology, concerns that pt would not be an ideal dialysis candidate.  Pt seen by Palliative Care. Wishes for full code, full scope of treatment noted Cr stable 3.38 this AM Not candidate for nephrostomy tube per IR. Per Urology, not candidate for stenting   Leukocytosis -    Oncology following, who felt that the leukocytosis could be from patient receiving G-CSF after her recent chemo. B blood culture results neg. WBC stable at 10.7  Hypotension blood pressure had been soft recently Continued on midodrine 5 mg p.o. 3 times daily.   Diastolic dysfunction  2D echo done on 08/22/2023 which showed EF of 60 to 65% with grade 1 diastolic dysfunction.   Diuretics were held due to worsening renal function.   Large B-cell lymphoma transformation and CLL  being followed by oncologist had received recently mini CHOP regimen Oncology following    Anemia Hgb seems stable at this time Recheck CBC in AM   History of hepatitis B surface antigen positivity ID following Recs to start entecavir and then Rituxan in 1 weeks time per ID recs Dysphagia GI was consulted Pt now s/p EGD 5/29 with findings of benign appearing esophageal stenosis, dilated. Gastritis noted with non-bleeding duodenal ulcers with no stigmata of bleed, biopsied. No evidence of candidiasis GI recs for protonix 40mg  bid, carafate elixir 1g BID. stopped diflucan       Subjective: States feeling better today. Arms are swollen this AM  Physical Exam: Vitals:   09/01/23 1300 09/01/23 1400 09/01/23 1433 09/01/23 1500  BP: (!) 117/46 (!) 118/48  (!) 125/50  Pulse: 69 71  71  Resp: 19 18  19   Temp:   97.8 F (36.6 C)   TempSrc:   Oral   SpO2: 97% 97%  97%  Weight:      Height:       General exam: Awake, laying in bed, in nad Respiratory system: Normal respiratory effort, no wheezing Cardiovascular system: regular rate, s1, s2 Gastrointestinal system: Soft, nondistended, positive BS Central nervous system: CN2-12 grossly intact, strength intact Extremities: Perfused, no clubbing, BUE edematous Skin: Normal skin turgor, no notable skin lesions seen Psychiatry: Mood normal // no visual hallucinations   Data Reviewed:  Labs reviewed: Na 141, K 4.6, Cr 3.38, WBC 10.7, Hgb 7.9, Plts 118  Family Communication: Pt in room, family not at  bedside  Disposition: Status is: Inpatient Remains inpatient appropriate because: severity of illness  Planned Discharge Destination: Unclear at this time    Author: Cherylle Corwin, MD 09/01/2023 4:19 PM  For on call review [email or web address].

## 2023-09-02 DIAGNOSIS — N179 Acute kidney failure, unspecified: Secondary | ICD-10-CM | POA: Diagnosis not present

## 2023-09-02 DIAGNOSIS — R531 Weakness: Secondary | ICD-10-CM | POA: Diagnosis not present

## 2023-09-02 DIAGNOSIS — C8338 Diffuse large B-cell lymphoma, lymph nodes of multiple sites: Secondary | ICD-10-CM | POA: Diagnosis not present

## 2023-09-02 LAB — BASIC METABOLIC PANEL WITH GFR
Anion gap: 13 (ref 5–15)
BUN: 85 mg/dL — ABNORMAL HIGH (ref 8–23)
CO2: 24 mmol/L (ref 22–32)
Calcium: 7 mg/dL — ABNORMAL LOW (ref 8.9–10.3)
Chloride: 105 mmol/L (ref 98–111)
Creatinine, Ser: 3.43 mg/dL — ABNORMAL HIGH (ref 0.44–1.00)
GFR, Estimated: 13 mL/min — ABNORMAL LOW (ref >60)
Glucose, Bld: 86 mg/dL (ref 70–99)
Potassium: 4.4 mmol/L (ref 3.5–5.1)
Sodium: 142 mmol/L (ref 135–145)

## 2023-09-02 LAB — CBC
HCT: 22.6 % — ABNORMAL LOW (ref 36.0–46.0)
Hemoglobin: 6.6 g/dL — CL (ref 12.0–15.0)
MCH: 26.8 pg (ref 26.0–34.0)
MCHC: 29.2 g/dL — ABNORMAL LOW (ref 30.0–36.0)
MCV: 91.9 fL (ref 80.0–100.0)
Platelets: 115 10*3/uL — ABNORMAL LOW (ref 150–400)
RBC: 2.46 MIL/uL — ABNORMAL LOW (ref 3.87–5.11)
RDW: 13.3 % (ref 11.5–15.5)
WBC: 9.8 10*3/uL (ref 4.0–10.5)
nRBC: 0 % (ref 0.0–0.2)

## 2023-09-02 LAB — PREPARE RBC (CROSSMATCH)

## 2023-09-02 LAB — HEMOGLOBIN AND HEMATOCRIT, BLOOD
HCT: 28.9 % — ABNORMAL LOW (ref 36.0–46.0)
Hemoglobin: 8.7 g/dL — ABNORMAL LOW (ref 12.0–15.0)

## 2023-09-02 MED ORDER — SODIUM CHLORIDE 0.9% IV SOLUTION: Freq: Once | INTRAVENOUS | Status: AC

## 2023-09-02 MED ORDER — CHLORHEXIDINE GLUCONATE CLOTH 2 % EX PADS
6.0000 | MEDICATED_PAD | Freq: Every day | CUTANEOUS | Status: DC
  Administered 2023-09-03 – 2023-09-05 (×3): 6 via TOPICAL

## 2023-09-02 NOTE — Progress Notes (Signed)
    Patient Name: Jacqueline Hancock           DOB: 25-Jan-1941  MRN: [id number]      Admission Date: 08/28/2023  Attending Provider: Oral Billings, MD  Primary Diagnosis: ARF (acute renal failure) [facility])   Level of care: Stepdown    CROSS COVER NOTE   Date of Service   09/02/2023   Jacqueline Hancock, 83 y.o. female, was admitted on 08/28/2023 for ARF (acute renal failure) (HCC).    HPI/Events of Note   Anemia History of autoimmune hemolytic anemia Hemoglobin 7.9--> 6.6.  No acute changes reported.  Hemodynamically stable. No melena, hematochezia, or other bleeding reported tonight.  Per oncology note, recommending PRBC transfusion if hemoglobin less than 7.   Interventions/ Plan   Blood transfusion, 1 unit PRBC Recheck H&H after transfusion is completed, transfuse if HGB <7        Bailey Lesser, DNP, ACNPC- [facility]

## 2023-09-02 NOTE — Plan of Care (Signed)
  Problem: Health Behavior/Discharge Planning: Goal: Ability to manage health-related needs will improve Outcome: Progressing   Problem: Clinical Measurements: Goal: Ability to maintain clinical measurements within normal limits will improve Outcome: Progressing Goal: Cardiovascular complication will be avoided Outcome: Progressing   

## 2023-09-02 NOTE — Progress Notes (Signed)
 [facility] Progress Note  Subjective:  Pt seen in ICU UOP 400 cc yesterday Creat stable at 3.4 today  Vitals:   09/02/23 0700 09/02/23 0800 09/02/23 0853 09/02/23 0900  BP: (!) 98/39 (!) 102/39  (!) 111/45  Pulse: 73 74  76  Resp: 18 17  18   Temp:   98.8 F (37.1 C)   TempSrc:   Oral   SpO2: 96% 96%  96%  Weight:      Height:        Exam: Gen elderly, deconditioned, more alert/awake today No jvd or bruits Chest clear bilat to bases RRR no MRG Abd soft protuberant, non-tender GU foley in place MS no joint effusions or deformity Ext 1+ bilat LE edema Neuro as above       Renal-related home meds: Demadex  20 mg daily as needed Losartan  50 mg daily Norvasc  5 mg daily Nadolol  20 mg daily Others: Ultram , Compazine , Zofran , Prilosec, vitamins, allopurinol    Date                             Creat               eGFR (ml/min) 2012- 2016                  0.98- 1.06 2017- 2020                  1.06- 1.35         2021- 2022                  1.07- 1.35 2023                            1.11-  1.43 2024                            1.19- 1.58 Mar 2025                     1.27- 1.61 April 2025                    1.56- 2.42 5/01- 08/09/23               2.03- 2.34        20-24 ml/min 08/15/23                        2.51 08/18/22                        2.91 5/26                             4.23 08/29/23                        4.11   UA 5/26 - prot neg, 0-5 rbc, 11-20 wbc UNa 13, UCr 69 CT 08/28/23 - showed she dominant mass centered in the right abdominal retroperitoneum is amorphous, ill-defined and difficult to on this noncontrast exam.. estimated at 12.6 by 9.6 cm on 38/2, relatively similar to 08/09/2023... this mass encompasses and obscures the right adrenal gland and the right kidney. Amorphous soft tissue extends into the left-sided abdominal retroperitoneum.    Assessment/  Plan: AKI on CKD 4: b/l creatinine 2.0- 2.3 from early May 2025, eGFR 20-25 ml/min.  Creat  here was 4.2 on admission in the setting of recent starting of chemotherapy w/ high uric acid and suspected TLS. Pt was rx'd w/ rasburicase  and IVF's. Foley cath in place. BP's have been soft but not low. UA unremarkable, urine lytes c/w prerenal. CT imaging showed large R sided soft-tissue mass which encapsulates the R kidney.  AKI likely due to R hydronephrosis +/- tumor lysis syndrome +/- intravasc vol depletion +/- hypotension. Midodrine  was ^d to 10mg  tid and IVF"s were given. Creat is stable likely at a new baseline. Good UOP, no signs of uremia.  Pt's son states she does not have a kidney doctor. We will arrange for f/u in [facility] clinic. No other suggestions, will sign off.  R-sided hydronephrosis: seen by urology here, nothing can be done about the R sided hydro, as the tumor has encapsulized the R kidney. Suspect the R kidney is no longer functioning.  Hypotension: on midodrine  10 mg tid started here. BP's are low-normal here 110/70 range.  Volume: chronic LE edema is likely localized from intra-abdominal malignancy obstructing lymphatic flow. The edema is mild and has not progressed since initial consult.  No resp issues. 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 Plan per ONC was R-mini CHOP therapy, 1st dose was 5/21.  CLL: dx'd in 2021.  GOC: palliative care meeting held today. Pt is full code, full scope of care.        Larry Poag MD  [facility] 09/02/2023, 10:49 AM  Recent Labs  Lab 08/31/23 0420 09/01/23 0516 09/02/23 0423  HGB 7.9* 7.9* 6.6*  ALBUMIN  2.2* 2.1*  --   CALCIUM 6.7* 6.6* 7.0*  PHOS 7.0* 5.9*  --   CREATININE 3.64* 3.38* 3.43*  K 4.5 4.6 4.4   No results for input(s): "IRON", "TIBC", "FERRITIN" in the last 168 hours. Inpatient medications:  sodium chloride    Intravenous Once   Chlorhexidine  Gluconate Cloth  6 each Topical Q2200   entecavir   0.5 mg Oral Q72H   feeding supplement  1 Container Oral TID BM    Gerhardt's butt cream   Topical BID   midodrine   10 mg Oral TID WC   mupirocin  ointment  1 Application Nasal BID   pantoprazole  (PROTONIX ) IV  40 mg Intravenous Q12H   senna-docusate  2 tablet Oral QHS   sucralfate   1 g Oral BID    lactated ringers  50 mL/hr at 09/02/23 1610   ondansetron  (ZOFRAN ) IV, mouth rinse, phenol, polyethylene glycol, traZODone 

## 2023-09-02 NOTE — Progress Notes (Signed)
 Daily Progress Note   Patient Name: Jacqueline Hancock       Date: 09/02/2023 DOB: 1940/09/19  Age: 83 y.o. MRN#: [id number] Attending Physician: Oral Billings, MD Primary Care Physician: Estil Heman, NP Admit Date: 08/28/2023  Reason for Consultation/Follow-up: Establishing goals of care  Subjective: Patient resting comfortably in bed, son Jacqueline Hancock present at bedside, patient denies any acute complaints, is getting PRBC, recalls meeting with her oncologist on 5 - 30 - 25   Length of Stay: 5  Current Medications: Scheduled Meds:   Chlorhexidine  Gluconate Cloth  6 each Topical Q2200   entecavir   0.5 mg Oral Q72H   feeding supplement  1 Container Oral TID BM   Gerhardt's butt cream   Topical BID   midodrine   10 mg Oral TID WC   mupirocin  ointment  1 Application Nasal BID   pantoprazole  (PROTONIX ) IV  40 mg Intravenous Q12H   senna-docusate  2 tablet Oral QHS   sucralfate   1 g Oral BID    Continuous Infusions:   PRN Meds: ondansetron  (ZOFRAN ) IV, mouth rinse, phenol, polyethylene glycol, traZODone   Physical Exam         Awake alert Resting in bed Generalized edema upper lower extremities Has Foley catheter Monitor noted Overall with flat affect but little more interactive than on 5 - 30 - 25  Vital Signs: BP (!) 115/41   Pulse 71   Temp 98.1 F (36.7 C) (Oral) Comment: Simultaneous filing. User may not have seen previous data. Comment (Src): Simultaneous filing. User may not have seen previous data.  Resp 18   Ht 4\' 11"  (1.499 m)   Wt 66.4 kg   SpO2 94%   BMI 29.57 kg/m  SpO2: SpO2: 94 % O2 Device: O2 Device: Room Air O2 Flow Rate: O2 Flow Rate (L/min): 3 L/min  Intake/output summary:  Intake/Output Summary (Last 24 hours) at 09/02/2023 1336 Last data filed at  09/02/2023 1252 Gross per 24 hour  Intake 1638.33 ml  Output 1100 ml  Net 538.33 ml   LBM: Last BM Date : 09/01/23 Baseline Weight: Weight: 63.6 kg Most recent weight: Weight: 66.4 kg       Palliative Assessment/Data:      Patient Active Problem List   Diagnosis Date Noted   Generalized weakness 09/02/2023  Stricture and stenosis of esophagus 08/31/2023   Multiple duodenal ulcers 08/31/2023   Dysphagia 08/30/2023   Malnutrition of moderate degree 08/29/2023   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   Leucocytosis 08/28/2023   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   Perinephric abdominal mass 08/03/2023   Hyperkalemia 08/03/2023   ARF (acute renal failure) (HCC) 08/01/2023   Hypercalcemia 06/15/2023   Traumatic perinephric hematoma of right side, initial encounter 06/15/2023   ILD (interstitial lung disease) (HCC) 06/15/2023   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   Weakness 06/15/2023   Kidney laceration, right 06/15/2023   Atherosclerosis of aorta (HCC) 02/07/2023   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   Obesity (BMI 30.0-34.9) 08/06/2020   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  They remain hopeful for ongoing oncologic care as well.  We discussed about monitoring hospital course and overall  disease trajectory of illness, monitoring renal function, monitoring oral intake and a functional status so as to make further goals of care discussions.  At present, full code full scope as per initial discussions.  Goals of Care and Additional Recommendations: Limitations on Scope of Treatment: Full Scope Treatment  Code Status:    Code Status Orders  (From admission, onward)           Start     Ordered   08/28/23 2140  Full code  Continuous       Question:  By:  Answer:  Consent: discussion documented in EHR   08/28/23 2140           Code Status History     Date Active Date Inactive Code Status Order ID Comments User Context   08/21/2023 0936 08/22/2023 0508 Full Code [id number]  Lucinda Saber, MD HOV   08/01/2023 1613 08/05/2023 2015 Full Code [id number]  Gaylin Ke, MD ED   06/15/2023 0145 06/18/2023 1858 Full Code [id number]  Kenny Peals, MD ED   01/25/2020 2155 01/27/2020 0105 Full Code [id number]  Norins, Beauford Bounds, MD ED       Prognosis:  Unable to determine  Discharge Planning: To Be Determined  Care plan was discussed with patient, son Jacqueline Hancock was present at bedside  Thank you for allowing the Palliative Medicine Team to assist in the care of this patient. MOD MDM     Greater than 50%  of this time was spent counseling and coordinating care related to the above assessment and plan.  Lujean Sake, MD  Please contact Palliative Medicine Team phone at [phone] for questions and concerns.

## 2023-09-02 NOTE — Progress Notes (Signed)
 Progress Note   Patient: Jacqueline Hancock ZOX:[id number] DOB: October 25, 1940 DOA: 08/28/2023     5 DOS: the patient was seen and examined on 09/02/2023   Brief hospital course: 83 y.o. 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  Denies any nausea vomiting diarrhea fever chills chest pain or shortness of breath.  Has abdominal fullness.  Due to persistent weakness patient was brought to the ER by the patient's family.   Dr. Salomon Cree who advised starting patient on rasburicase  for possible tumor lysis syndrome and also ER physician discussed with Dr. Edson Graces who advised starting patient on bicarbonate infusion.  CT abdomen pelvis does not show anything acute but shows chronic changes.   Assessment and Plan:  Acute renal failure on chronic kidney disease stage III with concern for possible tumor lysis syndrome with effective solitary functioning kidney from mass encompassing R kidney -     Nephrology and oncology following Pt is s/p rasburicase   Pt was given sodium bicarb infusion. Per Nephrology, concerns that pt would not be an ideal dialysis candidate.  Pt seen by Palliative Care. Wishes for full code, full scope of treatment noted Cr stable 3.43 this AM, Per Nephrology, Cr is stable and is likely new baseline. Good urine output with no signs of uremia. [facility] to f/u as outpt Not candidate for nephrostomy tube per IR. Per Urology, not candidate for stenting   Leukocytosis -    Oncology following, who felt that the leukocytosis could be from patient receiving G-CSF after her recent chemo. B blood culture results neg. WBC normalized  Hypotension blood pressure had been soft recently Continued on midodrine  5 mg p.o. 3 times daily. BP stable   Diastolic dysfunction  2D echo done on 08/22/2023 which showed EF of 60 to 65% with grade 1  diastolic dysfunction.   Diuretics were held due to worsening renal function.   Large B-cell lymphoma transformation and CLL being followed by oncologist had received recently mini CHOP regimen Oncology following    Anemia Hgb down to 6.6 this AM PRBC tx ordered this AM   History of hepatitis B surface antigen positivity ID following Recs to start entecavir  and then Rituxan in 1 weeks time per ID recs Dysphagia GI was consulted Pt now s/p EGD 5/29 with findings of benign appearing esophageal stenosis, dilated. Gastritis noted with non-bleeding duodenal ulcers with no stigmata of bleed, biopsied. No evidence of candidiasis GI recs for protonix  40mg  bid, carafate  elixir 1g BID. stopped diflucan       Subjective: Without complaints this AM. Reports feeling better  Physical Exam: Vitals:   09/02/23 1245 09/02/23 1300 09/02/23 1534 09/02/23 1600  BP:  (!) 115/41 (!) 115/52 (!) 118/44  Pulse: 71 71 69 67  Resp: 19 18 17 14   Temp:  98.1 F (36.7 C) 98.6 F (37 C)   TempSrc:  Oral Oral   SpO2: 95% 94% 95% 94%  Weight:      Height:       General exam: Conversant, in no acute distress Respiratory system: normal chest rise, clear, no audible wheezing Cardiovascular system: regular rhythm, s1-s2 Gastrointestinal system: Nondistended, nontender, pos BS Central nervous system: No seizures, no tremors Extremities: No cyanosis, no joint deformities Skin: No rashes, no pallor Psychiatry: Affect normal // no auditory hallucinations   Data Reviewed:  Labs reviewed: Na  142, K 4.4, Cr 3.43, WBC 9.8, Hgb 6.6, Plts 115  Family Communication: Pt in room, family not at bedside  Disposition: Status is: Inpatient Remains inpatient appropriate because: severity of illness  Planned Discharge Destination: Unclear at this time    Author: Cherylle Corwin, MD 09/02/2023 4:15 PM  For on call review [email or web address].

## 2023-09-03 ENCOUNTER — Inpatient Hospital Stay (HOSPITAL_COMMUNITY)

## 2023-09-03 DIAGNOSIS — I509 Heart failure, unspecified: Secondary | ICD-10-CM

## 2023-09-03 DIAGNOSIS — R531 Weakness: Secondary | ICD-10-CM | POA: Diagnosis not present

## 2023-09-03 DIAGNOSIS — C8338 Diffuse large B-cell lymphoma, lymph nodes of multiple sites: Secondary | ICD-10-CM | POA: Diagnosis not present

## 2023-09-03 DIAGNOSIS — M7989 Other specified soft tissue disorders: Secondary | ICD-10-CM | POA: Diagnosis not present

## 2023-09-03 DIAGNOSIS — N179 Acute kidney failure, unspecified: Secondary | ICD-10-CM | POA: Diagnosis not present

## 2023-09-03 HISTORY — DX: Heart failure, unspecified: I50.9

## 2023-09-03 LAB — BASIC METABOLIC PANEL WITH GFR
Anion gap: 11 (ref 5–15)
BUN: 70 mg/dL — ABNORMAL HIGH (ref 8–23)
CO2: 24 mmol/L (ref 22–32)
Calcium: 7.5 mg/dL — ABNORMAL LOW (ref 8.9–10.3)
Chloride: 106 mmol/L (ref 98–111)
Creatinine, Ser: 2.79 mg/dL — ABNORMAL HIGH (ref 0.44–1.00)
GFR, Estimated: 16 mL/min — ABNORMAL LOW (ref >60)
Glucose, Bld: 79 mg/dL (ref 70–99)
Potassium: 4.4 mmol/L (ref 3.5–5.1)
Sodium: 141 mmol/L (ref 135–145)

## 2023-09-03 LAB — CULTURE, BLOOD (ROUTINE X 2)
Culture: NO GROWTH
Culture: NO GROWTH

## 2023-09-03 MED ORDER — TRAMADOL HCL 50 MG PO TABS
50.0000 mg | ORAL_TABLET | Freq: Every day | ORAL | Status: DC | PRN
  Administered 2023-09-03: 50 mg via ORAL
  Filled 2023-09-03 (×2): qty 1

## 2023-09-03 MED ORDER — SODIUM CHLORIDE 0.9% FLUSH
10.0000 mL | INTRAVENOUS | Status: DC | PRN
  Administered 2023-09-05: 10 mL

## 2023-09-03 NOTE — Evaluation (Addendum)
 Occupational Therapy Evaluation Patient Details Name: Jacqueline Hancock MRN: [id number] DOB: [birth year] Today's Date: 09/03/2023   History of Present Illness   83 yo female who presents with persistent weakness, dysphagia, nausea vomiting and failure to thrive. 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     Clinical Impressions Pt admitted with the above diagnoses and presents with below problem list. Pt will benefit from continued acute OT to address the below listed deficits and maximize independence with basic ADLs prior to d/c. PTA, pt has been needing some increased assist with getting in and out of bed (son reports height of current bed impacting this), showering, and walking to bathroom using rw. Pt presents with generalized weakness. Evaluation limited by arrival of MD. Plan to return today for functional mobility/ADL assessment.     If plan is discharge home, recommend the following:         Functional Status Assessment         Equipment Recommendations         Recommendations for Other Services   PT consult     Precautions/Restrictions   Precautions Precautions: Fall Restrictions Weight Bearing Restrictions Per Provider Order: No         ADL either performed or assessed with clinical judgement   ADL    General ADL Comments: Pt presents with generalized weakness, flat affect.     Vision         Perception         Praxis         Pertinent Vitals/Pain Pain Assessment Pain Assessment: Faces Faces Pain Scale: Hurts a little bit Pain Location: buttocks Pain Descriptors / Indicators: Sore, Grimacing Pain Intervention(s): Limited activity within patient's tolerance, Monitored during session, Repositioned     Extremity/Trunk Assessment Upper Extremity Assessment Upper Extremity Assessment: Generalized weakness   Lower Extremity Assessment Lower  Extremity Assessment: Generalized weakness       Communication Communication Communication: No apparent difficulties   Cognition Arousal: Alert Behavior During Therapy: Flat affect Cognition: No apparent impairments                               Following commands: Intact       Cueing  General Comments   Cueing Techniques: Verbal cues;Gestural cues  Pt's MD arrived during eval. Will return for functional mobility/ADL assessment.   Exercises     Shoulder Instructions      Home Living Family/patient expects to be discharged to:: Private residence Living Arrangements: Alone Available Help at Discharge: Family;Available PRN/intermittently Type of Home: House Home Access: Stairs to enter [employer] of Steps: 2 Entrance Stairs-Rails: Left;Right Home Layout: One level     Bathroom Shower/Tub: Producer, television/film/video: Standard Bathroom Accessibility: Yes How Accessible: Accessible via walker Home Equipment: Rolling Walker (2 wheels);Shower seat   Additional Comments: Pt's son has been staying with her d/t worsening symptoms.      Prior Functioning/Environment Prior Level of Function : Needs assist             Mobility Comments: Gets up to her recliner and typically stays there during the day. uses rw to walk to bathroom. ADLs Comments: recently has been needing some assist with showers. Noted last admission about a month ago pt's PLOF was mod I.    OT Problem List: Decreased strength;Decreased activity  tolerance;Impaired balance (sitting and/or standing);Decreased knowledge of use of DME or AE;Decreased knowledge of precautions;Pain   OT Treatment/Interventions: Self-care/ADL training;Therapeutic exercise;Energy conservation;DME and/or AE instruction;Therapeutic activities;Patient/family education;Balance training      OT Goals(Current goals can be found in the care plan section)   Acute Rehab OT Goals Patient Stated Goal:  would like to sit up in recliner OT Goal Formulation: With patient/family Time For Goal Achievement: 09/17/23 Potential to Achieve Goals: Fair ADL Goals Pt Will Perform Upper Body Dressing: with set-up;with supervision;sitting Pt Will Perform Lower Body Dressing: with min assist;sit to/from stand Pt Will Transfer to Toilet: with min assist;ambulating;stand pivot transfer;bedside commode Pt Will Perform Toileting - Clothing Manipulation and hygiene: with min assist;sit to/from stand Additional ADL Goal #1: Pt will complete bed mobility at CGA level to prepare for EOB/OOB ADLs.   OT Frequency:  Min 2X/week    Co-evaluation              AM-PAC OT "6 Clicks" Daily Activity     Outcome Measure Help from another person eating meals?: A Little Help from another person taking care of personal grooming?: A Little Help from another person toileting, which includes using toliet, bedpan, or urinal?: A Lot Help from another person bathing (including washing, rinsing, drying)?: A Lot Help from another person to put on and taking off regular upper body clothing?: A Little Help from another person to put on and taking off regular lower body clothing?: A Lot 6 Click Score: 15   End of Session    Activity Tolerance: Patient limited by fatigue Patient left: in bed  OT Visit Diagnosis: Unsteadiness on feet (R26.81);Muscle weakness (generalized) (M62.81);Pain                Time: 3762-8315 OT Time Calculation (min): 16 min Charges:  OT General Charges $OT Visit: 1 Visit OT Evaluation $OT Eval Moderate Complexity: 1 Mod  Lael Pierce, OT Acute Rehabilitation Services Office: [phone]   Brinton Canavan 09/03/2023, 12:54 PM

## 2023-09-03 NOTE — Evaluation (Signed)
 Physical Therapy Evaluation Patient Details Name: Jacqueline Hancock MRN: [id number] DOB: [birth year] Today's Date: 09/03/2023  History of Present Illness  83 yo female who presents with persistent weakness, dysphagia, nausea vomiting and failure to thrive. 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 Son states she was independent at home alone prior to this illness ( so about 1 month or so ? other son has been staying with her since she has become increasingly weaker.  Clinical Impression  Pt was up in recliner. Very pleasant lady, flat affect, however smiled after she did walk and with a few jokes during session.   Very cooperative and wanting to try. Son present ( not the son who has been staying with pt). Pt was able to ambulate in room for first time in a long time. Pt swelling ( per patient report) has gone down a little in BLES, however pt still has a lot of swelling in upper thigh areas B, and abdominal distention making mobility challenging as well. Assisted back to bed . Encouraged pt to lay on Left or Right side to decrease pressure on sacrum area since she is reporting pain in this area. Will continue to follow while in acute care and work towards DC plan for continued PT in home or skilled facility depending if patient's family able to provide 24 care at this time. Pt does have rehab potential to improve hr mobility.       If plan is discharge home, recommend the following: A lot of help with walking and/or transfers;A lot of help with bathing/dressing/bathroom;Assistance with cooking/housework;Help with stairs or ramp for entrance;Assist for transportation   Can travel by private vehicle   Yes (maybe , getting close to be able to)    Equipment Recommendations Other (comment) (may need WC for longer community distances if pt does not progress)  Recommendations for Other Services        Functional Status Assessment Patient has had a recent decline in their functional status and demonstrates the ability to make significant improvements in function in a reasonable and predictable amount of time.     Precautions / Restrictions        Mobility  Bed Mobility Overal bed mobility: Needs Assistance Bed Mobility: Rolling, Sidelying to Sit Rolling: Used rails, Mod assist, +2 for physical assistance     Sit to supine: Mod assist, +2 for physical assistance   General bed mobility comments: pt with generalized weakness needing assistance for all aspects. Pt reporting pain in buttocks at site of pressure wound.    Transfers Overall transfer level: Needs assistance Equipment used: Rolling walker (2 wheels) Transfers: Sit to/from Stand Sit to Stand: Mod assist, +2 physical assistance           General transfer comment: sequencing cues provided. cues and assist with rw management. physical assist to power up, steady and control descent.    Ambulation/Gait Ambulation/Gait assistance: Min assist, +2 physical assistance Gait Distance (Feet): 15 Feet Assistive device: Rolling walker (2 wheels) Gait Pattern/deviations: Step-through pattern Gait velocity: very slow     General Gait Details: small steps, slow , very limited ability to push through arms due to overall general weakness. Pt proud of herself for taking some steps, it has been a while since she has been able to.  Stairs            Armed forces technical officer  Bed    Modified Rankin (Stroke Patients Only)       Balance Overall balance assessment: Needs assistance Sitting-balance support: Bilateral upper extremity supported, Feet supported Sitting balance-Leahy Scale: Fair Sitting balance - Comments: BUE support needed EOB   Standing balance support: Bilateral upper extremity supported, During functional activity, Reliant on assistive device for balance Standing balance-Leahy Scale:  Poor Standing balance comment: able to static stand with rw and +1 assist                             Pertinent Vitals/Pain Pain Assessment Pain Assessment: Faces Faces Pain Scale: Hurts little more Pain Location: buttocks when she sits down (educated son and patient about decreasing pressure onbuttocks area to elimiate sores in this area.) Pain Descriptors / Indicators: Sore, Grimacing Pain Intervention(s): Monitored during session, Repositioned (encouraged resting in bed on her side and switchign from side to side to decrease pressureon sacrum)    Home Living Family/patient expects to be discharged to:: Private residence (pt's son has been able to stay wiht her recently since she had declined , howevr before that she was independent at home .) Living Arrangements: Alone Available Help at Discharge: Family;Available PRN/intermittently (1 son is retired and may be able to hlep proved 24 hr care between several family memebers. Family is trying to firgure it out.) Type of Home: House Home Access: Stairs to enter Entrance Stairs-Rails: Lawyer of Steps: 2   Home Layout: One level Home Equipment: Agricultural consultant (2 wheels);Shower seat Additional Comments: Pt's son has been staying with her d/t worsening symptoms.    Prior Function Prior Level of Function : Needs assist       Physical Assist : Mobility (physical)     Mobility Comments: Gets up to her recliner and typically stays there during the day. uses rw to walk to bathroom. ADLs Comments: recently has been needing some assist with showers. Noted last admission about a month ago pt's PLOF was mod I.     Extremity/Trunk Assessment        Lower Extremity Assessment Lower Extremity Assessment: Generalized weakness (pt has 3+/ 5 for B knee extension however knee flexion B was 2/5. lots of abdominal destention making the pressure and more difficult for hip flexion.)       Communication    Communication Communication: No apparent difficulties    Cognition Arousal: Alert Behavior During Therapy: Flat affect                             Following commands: Intact       Cueing Cueing Techniques: Verbal cues, Gestural cues     General Comments      Exercises Other Exercises Other Exercises: seated B LE knee extension AROM 5x each , with knee extension holds with 5 DF/ PF exercises. Hip flexion in sitting 5x each AAROM.   Assessment/Plan    PT Assessment Patient needs continued PT services  PT Problem List Decreased strength;Decreased range of motion;Decreased activity tolerance;Decreased balance;Decreased mobility       PT Treatment Interventions Gait training;Stair training;Functional mobility training;Therapeutic activities;Therapeutic exercise;Patient/family education    PT Goals (Current goals can be found in the Care Plan section)  Acute Rehab PT Goals Patient Stated Goal: I want to be able to walk again and move for myself PT Goal Formulation: With patient/family Time For Goal Achievement: 09/17/23 Potential to Achieve  Goals: Good    Frequency Min 3X/week     Co-evaluation               AM-PAC PT "6 Clicks" Mobility  Outcome Measure Help needed turning from your back to your side while in a flat bed without using bedrails?: A Lot Help needed moving from lying on your back to sitting on the side of a flat bed without using bedrails?: A Lot Help needed moving to and from a bed to a chair (including a wheelchair)?: A Lot Help needed standing up from a chair using your arms (e.g., wheelchair or bedside chair)?: A Lot Help needed to walk in hospital room?: A Lot Help needed climbing 3-5 steps with a railing? : A Lot 6 Click Score: 12    End of Session Equipment Utilized During Treatment: Gait belt Activity Tolerance: Patient tolerated treatment well Patient left: in bed;with call bell/phone within reach;with bed alarm set;with  family/visitor present Nurse Communication: Mobility status PT Visit Diagnosis: Unsteadiness on feet (R26.81);Other abnormalities of gait and mobility (R26.89);Muscle weakness (generalized) (M62.81)    Time: 9604-5409 PT Time Calculation (min) (ACUTE ONLY): 23 min   Charges:   PT Evaluation $PT Eval Low Complexity: 1 Low   PT General Charges $$ ACUTE PT VISIT: 1 Visit         Brinley Treanor, PT, MPT Acute Rehabilitation Services Office: [phone] If a weekend: secure chat groups: [facility] PT, [facility] OT, [facility] SLP 09/03/2023   Khylin Gutridge 09/03/2023, 6:32 PM

## 2023-09-03 NOTE — Progress Notes (Signed)
 Bilateral upper extremity venous duplex has been completed. Preliminary results can be found in CV Proc through chart review.   09/03/23 4:17 PM Birda Buffy RVT

## 2023-09-03 NOTE — Progress Notes (Signed)
 Progress Note   Patient: Jacqueline Hancock HKV:[id number] DOB: Jul 20, 1940 DOA: 08/28/2023     6 DOS: the patient was seen and examined on 09/03/2023   Brief hospital course: 83 y.o. 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  Denies any nausea vomiting diarrhea fever chills chest pain or shortness of breath.  Has abdominal fullness.  Due to persistent weakness patient was brought to the ER by the patient's family.   Dr. Salomon Cree who advised starting patient on rasburicase  for possible tumor lysis syndrome and also ER physician discussed with Dr. Edson Graces who advised starting patient on bicarbonate infusion.  CT abdomen pelvis does not show anything acute but shows chronic changes.   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 Per Nephrology, concerns that pt would not be an ideal dialysis candidate.  Pt seen by Palliative Care. Wishes for full code, full scope of treatment noted Cr down to 2.79, Per Nephrology, Cr is stable and is likely new baseline. Good urine output with no signs of uremia. CKA to f/u as outpt Not candidate for nephrostomy tube per IR. Per Urology, not candidate for stenting   Leukocytosis -    Oncology following, who felt that the leukocytosis could be from patient receiving G-CSF after her recent chemo. B blood culture results neg. WBC normalized  Hypotension blood pressure had been soft recently Continued on midodrine  5 mg p.o. 3 times daily BP stable. Would begin to wean soon   Diastolic dysfunction  2D echo done on 08/22/2023 which showed EF of  60 to 65% with grade 1 diastolic dysfunction.   Diuretics were held due to worsening renal function.   Large B-cell lymphoma transformation and CLL being followed by oncologist had received recently mini CHOP regimen Oncology following    Anemia Hgb down to 6.6 on 5/31 Hgb improved with 1 unit prbc tx on 5/31   History of hepatitis B surface antigen positivity ID following Recs to start entecavir  and then Rituxan in 1 weeks time per ID recs Dysphagia GI was consulted Pt now s/p EGD 5/29 with findings of benign appearing esophageal stenosis, dilated. Gastritis noted with non-bleeding duodenal ulcers with no stigmata of bleed, biopsied. No evidence of candidiasis GI recs for protonix  40mg  bid, carafate  elixir 1g BID. stopped diflucan       Subjective: States feeling better. UE swelling is improving  Physical Exam: Vitals:   09/02/23 2200 09/02/23 2317 09/03/23 0334 09/03/23 1252  BP: (!) 122/44 (!) 131/55 (!) 119/50 (!) 128/57  Pulse: 69 70 71 76  Resp: 18   18  Temp:  98.4 F (36.9 C) 98.3 F (36.8 C) 98.2 F (36.8 C)  TempSrc:    Oral  SpO2: 96% 97% 96% 98%  Weight:      Height:       General exam: Awake, laying in bed, in nad Respiratory system: Normal respiratory effort, no wheezing Cardiovascular system: regular rate, s1, s2 Gastrointestinal system: Soft, nondistended, positive BS Central nervous system: CN2-12 grossly intact, strength intact Extremities: Perfused, no clubbing, BUE edema improved Skin: Normal skin turgor,  no notable skin lesions seen Psychiatry: Mood normal // no visual hallucinations   Data Reviewed:  Labs reviewed: Na 141, K 4.4, Cr 2.79, Hgb 8.7  Family Communication: Pt in room, family not at bedside  Disposition: Status is: Inpatient Remains inpatient appropriate because: severity of illness  Planned Discharge Destination: Skilled nursing facility    Author: Cherylle Corwin, MD 09/03/2023 2:28 PM  For on call review [email or web address].

## 2023-09-03 NOTE — Progress Notes (Signed)
 Daily Progress Note   Patient Name: Jacqueline Hancock       Date: 09/03/2023 DOB: [birth year]  Age: 83 y.o. MRN#: [id number] Attending Physician: Oral Billings, MD Primary Care Physician: Estil Heman, NP Admit Date: 08/28/2023  Reason for Consultation/Follow-up: Establishing goals of care  Subjective: Patient now out of stepdown unit, PT OT notes reviewed, noted patient's current condition and ability to try to attempt to participate with PT/OT.  Renal function continues to improve.  Flat affect  Length of Stay: 6  Current Medications: Scheduled Meds:   Chlorhexidine  Gluconate Cloth  6 each Topical Q2200   entecavir   0.5 mg Oral Q72H   feeding supplement  1 Container Oral TID BM   Gerhardt's butt cream   Topical BID   midodrine   10 mg Oral TID WC   pantoprazole  (PROTONIX ) IV  40 mg Intravenous Q12H   senna-docusate  2 tablet Oral QHS   sucralfate   1 g Oral BID    Continuous Infusions:   PRN Meds: ondansetron  (ZOFRAN ) IV, mouth rinse, phenol, polyethylene glycol, sodium chloride  flush, traMADol , traZODone   Physical Exam         No acute distress  Vital Signs: BP (!) 128/57   Pulse 76   Temp 98.2 F (36.8 C) (Oral)   Resp 18   Ht 4\' 11"  (1.499 m)   Wt 66.4 kg   SpO2 98%   BMI 29.57 kg/m  SpO2: SpO2: 98 % O2 Device: O2 Device: Room Air O2 Flow Rate: O2 Flow Rate (L/min): 3 L/min  Intake/output summary:  Intake/Output Summary (Last 24 hours) at 09/03/2023 1336 Last data filed at 09/02/2023 2158 Gross per 24 hour  Intake 619.6 ml  Output 500 ml  Net 119.6 ml   LBM: Last BM Date : 09/01/23 Baseline Weight: Weight: 63.6 kg Most recent weight: Weight: 66.4 kg       Palliative Assessment/Data:      Patient Active Problem List   Diagnosis Date Noted    Generalized weakness 09/02/2023   Stricture and stenosis of esophagus 08/31/2023   Multiple duodenal ulcers 08/31/2023   Dysphagia 08/30/2023   Malnutrition of moderate degree 08/29/2023   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   Leucocytosis 08/28/2023   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   Perinephric  abdominal mass 08/03/2023   Hyperkalemia 08/03/2023   ARF (acute renal failure) (HCC) 08/01/2023   Hypercalcemia 06/15/2023   Traumatic perinephric hematoma of right side, initial encounter 06/15/2023   ILD (interstitial lung disease) (HCC) 06/15/2023   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   Weakness 06/15/2023   Kidney laceration, right 06/15/2023   Atherosclerosis of aorta (HCC) 02/07/2023   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   Obesity (BMI 30.0-34.9) 08/06/2020   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  They remain hopeful for ongoing oncologic care as well.  We discussed about monitoring hospital course and overall disease trajectory of illness, monitoring renal function, monitoring oral intake and a functional status so as to make further goals of care discussions.  At present, full code full scope as per initial discussions. Plan could likely be for skilled nursing facility rehab attempt with the addition  of palliative services.  Continue to monitor hospital course.  Full code.  Full scope for now.  Goals of Care and Additional Recommendations: Limitations on Scope of Treatment: Full Scope Treatment  Code Status:    Code Status Orders  (From admission, onward)           Start     Ordered   08/28/23 2140  Full code  Continuous       Question:  By:  Answer:  Consent: discussion documented in EHR   08/28/23 2140           Code Status History     Date Active Date Inactive Code Status Order ID Comments User Context   08/21/2023 0936 08/22/2023 0508 Full Code [id number]  Lucinda Saber, MD HOV   08/01/2023 1613 08/05/2023 2015 Full Code [id number]  Gaylin Ke, MD ED   06/15/2023 0145 06/18/2023 1858 Full Code [id number]  Kenny Peals, MD ED   01/25/2020 2155 01/27/2020 0105 Full Code [id number]  Norins, Beauford Bounds, MD ED       Prognosis:  Unable to determine  Discharge Planning: To Be Determined  Care plan was discussed with IDT  Thank you for allowing the Palliative Medicine Team to assist in the care of this patient. low MDM     Greater than 50%  of this time was spent counseling and coordinating care related to the above assessment and plan.  Lujean Sake, MD  Please contact Palliative Medicine Team phone at [phone] for questions and concerns.

## 2023-09-03 NOTE — Progress Notes (Signed)
 Occupational Therapy Treatment Patient Details Name: Jacqueline Hancock MRN: [id number] DOB: Feb 23, 1941 Today's Date: 09/03/2023   History of present illness 83 yo female who presents with persistent weakness, dysphagia, nausea vomiting and failure to thrive. 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   OT comments  Pt seen for follow-up OT session to assess functional mobility and further assess ADLs. Max A needed to get to EOB using logroll technique. Pt sat EOB at CGA level for a few minutes, reported feeling ok and agreeable to get up to recliner. +2 mod assist needed for stand-pivot transfer to recliner. Up to mod A needed for UB bathing/dressing. Pt with flat affect throughout session and reporting fatigue.      If plan is discharge home, recommend the following:  Two people to help with walking and/or transfers;Two people to help with bathing/dressing/bathroom;Help with stairs or ramp for entrance;Assist for transportation;Direct supervision/assist for financial management;Direct supervision/assist for medications management;Assistance with cooking/housework   Equipment Recommendations  Other (comment) (pt's son is looking into a more appropriate height bed at home. pt has been struggling with bed mobility at home d/t this.)    Recommendations for Other Services PT consult    Precautions / Restrictions Precautions Precautions: Fall Restrictions Weight Bearing Restrictions Per Provider Order: No       Mobility Bed Mobility Overal bed mobility: Needs Assistance Bed Mobility: Rolling, Sidelying to Sit Rolling: Max assist, Used rails Sidelying to sit: Max assist, HOB elevated, Used rails       General bed mobility comments: pt with generalized weakness needing max A for all aspects. Pt reporting pain in buttocks at site of pressure wound.    Transfers Overall transfer level: Needs  assistance Equipment used: Rolling walker (2 wheels) Transfers: Sit to/from Stand, Bed to chair/wheelchair/BSC Sit to Stand: Mod assist, +2 physical assistance, +2 safety/equipment Stand pivot transfers: Mod assist, +2 physical assistance, +2 safety/equipment, From elevated surface   Step pivot transfers: Mod assist, +2 physical assistance, +2 safety/equipment, From elevated surface     General transfer comment: sequencing cues provided. cues and assist with rw management. physical assist to power up, steady and control descent.     Balance Overall balance assessment: Needs assistance Sitting-balance support: Bilateral upper extremity supported, Feet supported Sitting balance-Leahy Scale: Poor Sitting balance - Comments: BUE support needed EOB   Standing balance support: Bilateral upper extremity supported, During functional activity, Reliant on assistive device for balance Standing balance-Leahy Scale: Poor Standing balance comment: able to static stand with rw and +1 assist                           ADL either performed or assessed with clinical judgement   ADL Overall ADL's : Needs assistance/impaired Eating/Feeding: Set up;Bed level;Sitting   Grooming: Minimal assistance;Sitting   Upper Body Bathing: Moderate assistance;Sitting   Lower Body Bathing: Moderate assistance;+2 for physical assistance;+2 for safety/equipment;Sit to/from stand   Upper Body Dressing : Set up;Sitting   Lower Body Dressing: Moderate assistance;+2 for physical assistance;+2 for safety/equipment;Sit to/from stand   Toilet Transfer: Moderate assistance;+2 for physical assistance;+2 for safety/equipment;Stand-pivot;Rolling walker (2 wheels);BSC/3in1   Toileting- Clothing Manipulation and Hygiene: Moderate assistance;+2 for physical assistance;+2 for safety/equipment;Sit to/from stand         General ADL Comments: Max A needed to get to EOB using logroll technique. Pt sat EOB at CGA level  for a few minutes, reported feeling ok and agreeable to get up to recliner. +2 mod assist needed for stand-pivot transfer to recliner. Pt with flat affect throughout session.    Extremity/Trunk Assessment Upper Extremity Assessment Upper Extremity Assessment: Generalized weakness   Lower Extremity Assessment Lower Extremity Assessment: Defer to PT evaluation;Generalized weakness        Vision       Perception     Praxis     Communication Communication Communication: No apparent difficulties   Cognition Arousal: Alert Behavior During Therapy: Flat affect Cognition: No apparent impairments                               Following commands: Intact        Cueing   Cueing Techniques: Verbal cues, Gestural cues  Exercises      Shoulder Instructions       General Comments Pt's MD arrived during eval. Will return for functional mobility/ADL assessment.    Pertinent Vitals/ Pain       Pain Assessment Pain Assessment: Faces Faces Pain Scale: Hurts little more Pain Location: buttocks Pain Descriptors / Indicators: Sore, Grimacing Pain Intervention(s): Limited activity within patient's tolerance, Monitored during session, Repositioned, Patient requesting pain meds-RN notified  Home Living Family/patient expects to be discharged to:: Private residence Living Arrangements: Alone Available Help at Discharge: Family;Available PRN/intermittently Type of Home: House Home Access: Stairs to enter [employer] of Steps: 2 Entrance Stairs-Rails: Left;Right Home Layout: One level     Bathroom Shower/Tub: Producer, television/film/video: Standard Bathroom Accessibility: Yes How Accessible: Accessible via walker Home Equipment: Rolling Walker (2 wheels);Shower seat   Additional Comments: Pt's son has been staying with her d/t worsening symptoms.      Prior Functioning/Environment              Frequency  Min 2X/week        Progress  Toward Goals  OT Goals(current goals can now be found in the care plan section)  Progress towards OT goals: Progressing toward goals  Acute Rehab OT Goals Patient Stated Goal: to sit up in recliner OT Goal Formulation: With patient/family Time For Goal Achievement: 09/17/23 Potential to Achieve Goals: Fair ADL Goals Pt Will Perform Upper Body Dressing: with set-up;with supervision;sitting Pt Will Perform Lower Body Dressing: with min assist;sit to/from stand Pt Will Transfer to Toilet: with min assist;ambulating;stand pivot transfer;bedside commode Pt Will Perform Toileting - Clothing Manipulation and hygiene: with min assist;sit to/from stand Additional ADL Goal #1: Pt will complete bed mobility at CGA level to prepare for EOB/OOB ADLs.  Plan      Co-evaluation                 AM-PAC OT "6 Clicks" Daily Activity     Outcome Measure   Help from another person eating meals?: A Little Help from another person taking care of personal grooming?: A Little Help from another person toileting, which includes using toliet, bedpan, or urinal?: A Lot Help from another person bathing (including washing, rinsing, drying)?: A Lot Help from another person to put on and taking off regular upper body clothing?: A Lot Help from another person to put on and taking off regular lower body clothing?: A Lot 6 Click Score: 14    End of Session Equipment Utilized During Treatment: Rolling walker (2 wheels)  OT Visit Diagnosis: Unsteadiness on feet (R26.81);Muscle weakness (generalized) (M62.81);Pain  Activity Tolerance Patient limited by fatigue   Patient Left in chair;with call bell/phone within reach;with family/visitor present   Nurse Communication Mobility status;Precautions        Time: 1206-1228 OT Time Calculation (min): 22 min  Charges: OT General Charges $OT Visit: 1 Visit OT Evaluation $OT Eval Moderate Complexity: 1 Mod OT Treatments $Self Care/Home Management : 8-22  mins  Lael Pierce, OT Acute Rehabilitation Services Office: [phone]   Brinton Canavan 09/03/2023, 1:09 PM

## 2023-09-03 NOTE — Progress Notes (Addendum)
    Patient arrived from ICU with Ander Katos, RN and transporter.  Patient Buechele is alert and oriented x 4.  Vitals taken.  Patient oriented to room.  Telemetry placed with confirmation with Ammon Bales from [facility].  Skin assessment completed with Sherian Dimitri, RN.  Bed alarm activated, call bell within reach, bed in lowest position with wheels locked, and bed rails up x 3.  Continue to monitor.

## 2023-09-04 DIAGNOSIS — R531 Weakness: Secondary | ICD-10-CM | POA: Diagnosis not present

## 2023-09-04 DIAGNOSIS — B181 Chronic viral hepatitis B without delta-agent: Secondary | ICD-10-CM | POA: Diagnosis not present

## 2023-09-04 DIAGNOSIS — R1319 Other dysphagia: Secondary | ICD-10-CM | POA: Diagnosis not present

## 2023-09-04 DIAGNOSIS — N178 Other acute kidney failure: Secondary | ICD-10-CM | POA: Diagnosis not present

## 2023-09-04 DIAGNOSIS — C8338 Diffuse large B-cell lymphoma, lymph nodes of multiple sites: Secondary | ICD-10-CM | POA: Diagnosis not present

## 2023-09-04 DIAGNOSIS — N179 Acute kidney failure, unspecified: Secondary | ICD-10-CM | POA: Diagnosis not present

## 2023-09-04 LAB — TYPE AND SCREEN
ABO/RH(D): B POS
Antibody Screen: NEGATIVE
Unit division: 0
Unit division: 0

## 2023-09-04 LAB — CBC
HCT: 30.9 % — ABNORMAL LOW (ref 36.0–46.0)
Hemoglobin: 9.1 g/dL — ABNORMAL LOW (ref 12.0–15.0)
MCH: 27.5 pg (ref 26.0–34.0)
MCHC: 29.4 g/dL — ABNORMAL LOW (ref 30.0–36.0)
MCV: 93.4 fL (ref 80.0–100.0)
Platelets: 131 10*3/uL — ABNORMAL LOW (ref 150–400)
RBC: 3.31 MIL/uL — ABNORMAL LOW (ref 3.87–5.11)
RDW: 13.7 % (ref 11.5–15.5)
WBC: 14.1 10*3/uL — ABNORMAL HIGH (ref 4.0–10.5)
nRBC: 0 % (ref 0.0–0.2)

## 2023-09-04 LAB — BASIC METABOLIC PANEL WITH GFR
Anion gap: 11 (ref 5–15)
BUN: 53 mg/dL — ABNORMAL HIGH (ref 8–23)
CO2: 23 mmol/L (ref 22–32)
Calcium: 8 mg/dL — ABNORMAL LOW (ref 8.9–10.3)
Chloride: 107 mmol/L (ref 98–111)
Creatinine, Ser: 2.63 mg/dL — ABNORMAL HIGH (ref 0.44–1.00)
GFR, Estimated: 18 mL/min — ABNORMAL LOW (ref >60)
Glucose, Bld: 82 mg/dL (ref 70–99)
Potassium: 4.4 mmol/L (ref 3.5–5.1)
Sodium: 141 mmol/L (ref 135–145)

## 2023-09-04 LAB — BPAM RBC
Blood Product Expiration Date: 202506302359
Blood Product Expiration Date: 202507052359
ISSUE DATE / TIME: 202505311234
Unit Type and Rh: 1700
Unit Type and Rh: 5100

## 2023-09-04 MED ORDER — HEPARIN SODIUM (PORCINE) 5000 UNIT/ML IJ SOLN
5000.0000 [IU] | Freq: Three times a day (TID) | INTRAMUSCULAR | Status: DC
  Administered 2023-09-04 – 2023-09-05 (×4): 5000 [IU] via SUBCUTANEOUS
  Filled 2023-09-04 (×4): qty 1

## 2023-09-04 NOTE — Progress Notes (Signed)
 Progress Note   Patient: Jacqueline Hancock DOB: July 03, 1940 DOA: 08/28/2023     7 DOS: the patient was seen and examined on 09/04/2023   Brief hospital course: 83 y.o. 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  Denies any nausea vomiting diarrhea fever chills chest pain or shortness of breath.  Has abdominal fullness.  Due to persistent weakness patient was brought to the ER by the patient's family.   Dr. Salomon Cree who advised starting patient on rasburicase  for possible tumor lysis syndrome and also ER physician discussed with Dr. Edson Graces who advised starting patient on bicarbonate infusion.  CT abdomen pelvis does not show anything acute but shows chronic changes.   Assessment and Plan:  Acute renal failure on chronic kidney disease stage III with concern for possible tumor lysis syndrome with effective solitary functioning kidney from mass encompassing R kidney -     Nephrology and oncology had been following Pt was given rasburicase   Pt was given sodium bicarb infusion. Per Nephrology, concerns that pt would not be an ideal dialysis candidate.  Pt seen by Palliative Care. Wishes for full code, full scope of treatment noted Cr down to 2.79, Per Nephrology, Cr is stable and is likely new baseline. Good urine output with no signs of uremia. CKA to f/u as outpt Not candidate for nephrostomy tube per IR. Per Urology, not candidate for stenting Plan for discharge home, pending hospital bed to be delivered   Leukocytosis -    Oncology following, who felt that the leukocytosis could be from patient receiving G-CSF after her recent chemo. B blood culture results neg. WBC normalized  Hypotension blood pressure had been soft recently Continued on midodrine  5 mg p.o. 3 times daily BP stable. Would begin to wean soon    Diastolic dysfunction  2D echo done on 08/22/2023 which showed EF of 60 to 65% with grade 1 diastolic dysfunction.   Diuretics were held due to worsening renal function.   Large B-cell lymphoma transformation and CLL being followed by oncologist had received recently mini CHOP regimen Oncology following    Anemia Hgb down to 6.6 on 5/31 Hgb improved with 1 unit prbc tx on 5/31   History of hepatitis B surface antigen positivity ID following Recs to start entecavir  and then Rituxan in 1 weeks time per ID recs Dysphagia GI was consulted Pt now s/p EGD 5/29 with findings of benign appearing esophageal stenosis, dilated. Gastritis noted with non-bleeding duodenal ulcers with no stigmata of bleed, biopsied. No evidence of candidiasis GI recs for protonix  40mg  bid, carafate  elixir 1g BID. stopped diflucan       Subjective: Feeling better today  Physical Exam: Vitals:   09/04/23 0545 09/04/23 1127 09/04/23 1128 09/04/23 1606  BP: 120/60 (!) 123/57 (!) 123/57 (!) 148/68  Pulse: 69  67 70  Resp: 16     Temp: 98.3 F (36.8 C)     TempSrc:      SpO2: 99%     Weight:      Height:       General exam: Conversant, in no acute distress Respiratory system: normal chest rise, clear, no audible wheezing Cardiovascular system: regular rhythm, s1-s2 Gastrointestinal system: Nondistended, nontender, pos BS Central nervous system: No seizures, no tremors Extremities: No cyanosis, no joint deformities Skin: No rashes, no pallor Psychiatry: Affect  normal // no auditory hallucinations   Data Reviewed:  Labs reviewed: Na 141, K 4.4, Cr 2.63, Hgb 9.1, Plts 131  Family Communication: Pt in room, family at bedside  Disposition: Status is: Inpatient Remains inpatient appropriate because: severity of illness  Planned Discharge Destination: Skilled nursing facility    Author: Cherylle Corwin, MD 09/04/2023 6:05 PM  For on call review [email or web address].

## 2023-09-04 NOTE — Progress Notes (Signed)
 Mobility Specialist - Progress Note   09/04/23 0943  Mobility  Activity Transferred from bed to chair  Level of Assistance Moderate assist, patient does 50-74%  Assistive Device Front wheel walker  Distance Ambulated (ft) 2 ft  Range of Motion/Exercises Active  Activity Response Tolerated fair  Mobility visit 1 Mobility  Mobility Specialist Start Time (ACUTE ONLY) 0930  Mobility Specialist Stop Time (ACUTE ONLY) 0943  Mobility Specialist Time Calculation (min) (ACUTE ONLY) 13 min   Pt was found in bed and agreeable to mobilize. C/o pain from sacrum area. Pt was left on recliner chair with all needs met. Call bell in reach and chair alarm on. RN notified.   Lorna Rose,  Mobility Specialist Can be reached via Secure Chat

## 2023-09-04 NOTE — Progress Notes (Signed)
 Daily Progress Note   Patient Name: Jacqueline Hancock       Date: 09/04/2023 DOB: 1940/11/06  Age: 83 y.o. MRN#: [id number] Attending Physician: Jacqueline Billings, MD Primary Care Physician: Jacqueline Heman, NP Admit Date: 08/28/2023  Reason for Consultation/Follow-up: Establishing goals of care  Subjective: Patient resting in bed, attempting PO, renal function improving, PT OT notes reviewed, noted patient's current condition and ability to try to attempt to participate with PT/OT.  Renal function continues to improve.    Length of Stay: 7  Current Medications: Scheduled Meds:   Chlorhexidine  Gluconate Cloth  6 each Topical Q2200   entecavir   0.5 mg Jacqueline Q72H   feeding supplement  1 Container Jacqueline TID BM   Gerhardt's butt cream   Topical BID   heparin  injection (subcutaneous)  5,000 Units Subcutaneous Q8H   midodrine   10 mg Jacqueline TID WC   pantoprazole  (PROTONIX ) IV  40 mg Intravenous Q12H   senna-docusate  2 tablet Jacqueline QHS   sucralfate   1 g Jacqueline BID    Continuous Infusions:   PRN Meds: ondansetron  (ZOFRAN ) IV, mouth rinse, phenol, polyethylene glycol, sodium chloride  flush, traMADol , traZODone   Physical Exam         No acute distress Regular work of breathing Some edema  Vital Signs: BP (!) 123/57   Pulse 67   Temp 98.3 F (36.8 C)   Resp 16   Ht 4\' 11"  (1.499 m)   Wt 66.9 kg   SpO2 99%   BMI 29.79 kg/m  SpO2: SpO2: 99 % O2 Device: O2 Device: Room Air O2 Flow Rate: O2 Flow Rate (L/min): 3 L/min  Intake/output summary:  Intake/Output Summary (Last 24 hours) at 09/04/2023 1346 Last data filed at 09/04/2023 0221 Gross per 24 hour  Intake 120 ml  Output 400 ml  Net -280 ml   LBM: Last BM Date : 09/03/23 Baseline Weight: Weight: 63.6 kg Most recent weight: Weight: 66.9  kg       Palliative Assessment/Data:      Patient Active Problem List   Diagnosis Date Noted   Generalized weakness 09/02/2023   Stricture and stenosis of esophagus 08/31/2023   Multiple duodenal ulcers 08/31/2023   Dysphagia 08/30/2023   Malnutrition of moderate degree 08/29/2023   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   Leucocytosis 08/28/2023  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   Perinephric abdominal mass 08/03/2023   Hyperkalemia 08/03/2023   ARF (acute renal failure) (HCC) 08/01/2023   Hypercalcemia 06/15/2023   Traumatic perinephric hematoma of right side, initial encounter 06/15/2023   ILD (interstitial lung disease) (HCC) 06/15/2023   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   Weakness 06/15/2023   Kidney laceration, right 06/15/2023   Atherosclerosis of aorta (HCC) 02/07/2023   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   Obesity (BMI 30.0-34.9) 08/06/2020   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  They remain hopeful for ongoing oncologic care as well.  We discussed about monitoring hospital course and overall disease trajectory of illness, monitoring renal function, monitoring Jacqueline intake and a functional status so as to make further goals of care discussions.  At present, full  code full scope as per initial discussions. Plan could likely be for skilled nursing facility rehab attempt with the addition of palliative services.  Continue to monitor hospital course.  Full code.  Full scope for now. Renal function improving, PO intake improving, patient attempting PT OT, their notes reviewed.   Goals of Care and Additional Recommendations: Limitations on Scope of Treatment: Full Scope Treatment  Code Status:    Code Status Orders  (From admission, onward)           Start     Ordered   08/28/23 2140  Full code  Continuous       Question:  By:  Answer:  Consent: discussion documented in EHR   08/28/23 2140           Code Status History     Date Active Date Inactive Code Status Order ID Comments User Context   08/21/2023 0936 08/22/2023 0508 Full Code [id number]  Jacqueline Saber, MD HOV   08/01/2023 1613 08/05/2023 2015 Full Code [id number]  Jacqueline Ke, MD ED   06/15/2023 0145 06/18/2023 1858 Full Code [id number]  Jacqueline Peals, MD ED   01/25/2020 2155 01/27/2020 0105 Full Code [id number]  Hancock, Jacqueline Bounds, MD ED       Prognosis:  Unable to determine  Discharge Planning: Home with home health PT versus SNF rehab with palliative.   Care plan was discussed with patient and son Jacqueline Hancock at bedside.   Thank you for allowing the Palliative Medicine Team to assist in the care of this patient. Mod MDM     Greater than 50%  of this time was spent counseling and coordinating care related to the above assessment and plan.  Jacqueline Sake, MD  Please contact Palliative Medicine Team phone at [phone] for questions and concerns.

## 2023-09-04 NOTE — Progress Notes (Signed)
 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)   ILD (interstitial lung disease) (HCC)   CLL (chronic lymphocytic leukemia) (HCC)   Hyperkalemia   Diffuse large B-cell lymphoma of lymph nodes of multiple regions (HCC)   Tumor lysis syndrome   Leucocytosis   Malnutrition of moderate degree   Dysphagia   Stricture and stenosis of esophagus   Multiple duodenal ulcers   Generalized weakness          Assessment: 68 YF with history of 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 Levern Reader at the end of July for viral load and monitoring  Evaluation of this patient requires complex antimicrobial therapy evaluation and counseling + isolation needs for disease transmission risk assessment and mitigation     Microbiology:   Antibiotics: Entecavir  5/8-present Cultures: Blood /27 no growth Urine  Other   SUBJECTIVE: Sitting in chair, family at bedside Interval:  afebrile overnights  Review of Systems: Review of Systems  All other systems reviewed and are negative.    Scheduled Meds:   Chlorhexidine  Gluconate Cloth  6 each Topical Q2200   entecavir   0.5 mg Oral Q72H   feeding supplement  1 Container Oral TID BM   Gerhardt's butt cream   Topical BID   heparin  injection (subcutaneous)  5,000 Units Subcutaneous Q8H   midodrine   10 mg Oral TID WC   pantoprazole  (PROTONIX ) IV  40 mg Intravenous Q12H   senna-docusate  2 tablet Oral QHS   sucralfate   1 g Oral BID   Continuous Infusions: PRN Meds:.ondansetron  (ZOFRAN ) IV, mouth rinse, phenol, polyethylene glycol, sodium chloride  flush, traMADol , traZODone  Allergies  Allergen Reactions   Nexium [Esomeprazole Magnesium] Other (See Comments)    Headache   Peanut Butter Flavoring Agent (Non-Screening) Itching    OBJECTIVE: Vitals:   09/04/23 0500 09/04/23 0545 09/04/23 1127 09/04/23 1128  BP:  120/60 (!) 123/57 (!) 123/57  Pulse:  69  67  Resp:  16    Temp:  98.3 F (36.8 C)    TempSrc:      SpO2:  99%    Weight: 66.9 kg     Height:       Body mass index is 29.79 kg/m.  Physical Exam Constitutional:      Appearance: Normal appearance.  HENT:     Head: Normocephalic and atraumatic.     Right Ear: Tympanic membrane normal.     Left Ear: Tympanic membrane normal.     Nose: Nose normal.     Mouth/Throat:     Mouth: Mucous membranes are moist.  Eyes:     Extraocular Movements: Extraocular  movements intact.     Conjunctiva/sclera: Conjunctivae normal.     Pupils: Pupils are equal, round, and reactive to light.  Cardiovascular:     Rate and Rhythm: Normal rate and regular rhythm.     Heart sounds: No murmur heard.    No friction rub. No gallop.  Pulmonary:     Effort: Pulmonary effort is normal.     Breath sounds: Normal breath sounds.  Abdominal:     General: Abdomen is flat.     Palpations: Abdomen is soft.  Musculoskeletal:        General: Normal range of motion.  Skin:    General: Skin is warm and dry.  Neurological:     General: No focal deficit present.     Mental Status: She is alert and  oriented to person, place, and time.  Psychiatric:        Mood and Affect: Mood normal.       Lab Results Lab Results  Component Value Date   WBC 14.1 (H) 09/04/2023   HGB 9.1 (L) 09/04/2023   HCT 30.9 (L) 09/04/2023   MCV 93.4 09/04/2023   PLT 131 (L) 09/04/2023    Lab Results  Component Value Date   CREATININE 2.63 (H) 09/04/2023   BUN 53 (H) 09/04/2023   NA 141 09/04/2023   K 4.4 09/04/2023   CL 107 09/04/2023   CO2 23 09/04/2023    Lab Results  Component Value Date   ALT 13 08/30/2023   AST 21 08/30/2023   ALKPHOS 84 08/30/2023   BILITOT 0.8 08/30/2023        Orlie Bjornstad, MD [facility] 09/04/2023, 2:53 PM

## 2023-09-04 NOTE — TOC Progression Note (Signed)
 Transition of Care [facility]) - Progression Note    Patient Details  Name: Jacqueline Hancock MRN: [id number] Date of Birth: 1941-03-02  Transition of Care [facility] CM/SW Contact  Gertha Ku, LCSW Phone Number: 09/04/2023, 1:42 PM  Clinical Narrative:     CSW met with the pt and the pt's son, Jacqueline Hancock, to discuss recommendations for home health services. Pt has declined SNF placement and would like to return home with home health support. Pt reports having had [facility] services in the past and prefers this agency again. Pt's son is requesting a hospital bed, pt has no preference regarding the DME company. Will need DME orders; a referral has been sent to [facility] for delivery of the hospital bed.  [facility] has declined the pt for home health services. [facility] has accepted the pt for home health PT/OT and aide services. TOC to follow.   Expected Discharge Plan: Home w Home Health Services Barriers to Discharge: Continued Medical Work up  Expected Discharge Plan and Services In-house Referral: NA Discharge Planning Services: CM Consult Post Acute Care Choice: NA Living arrangements for the past 2 months: Single Family Home                 DME Arranged: N/A DME Agency: NA       HH Arranged: NA HH Agency: NA         Social Determinants of Health (SDOH) Interventions SDOH Screenings   Food Insecurity: No Food Insecurity (08/29/2023)  Housing: Low Risk  (08/29/2023)  Transportation Needs: No Transportation Needs (08/29/2023)  Utilities: Not At Risk (08/29/2023)  Recent Concern: Utilities - At Risk (08/02/2023)  Depression (PHQ2-9): Low Risk  (07/18/2023)  Social Connections: Moderately Integrated (08/29/2023)  Tobacco Use: Low Risk  (08/31/2023)  Health Literacy: Adequate Health Literacy (02/16/2023)    Readmission Risk Interventions    08/30/2023   11:03 AM 08/02/2023   11:12 AM  Readmission Risk Prevention Plan  Transportation Screening Complete Complete  PCP or Specialist Appt  within 5-7 Days  Complete  Home Care Screening  Complete  Medication Review (RN CM)  Complete  Medication Review (RN Care Manager) Complete   PCP or Specialist appointment within 3-5 days of discharge Complete   HRI or Home Care Consult Complete   SW Recovery Care/Counseling Consult Complete   Palliative Care Screening Complete   Skilled Nursing Facility Complete

## 2023-09-04 NOTE — Progress Notes (Addendum)
 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  She is on active treatment with R-miniCHOP, status post cycle 1 on 08/23/23.  Admitted 08/28/2023 due to complaints of generalized weakness, dysphagia, nausea vomiting and failure to thrive.   CLL/SLL  CD20+ CD5+ve lymphoproliferative disorder  Leukocytosis - Diagnosed 2021 when she presented with hemolytic anemia and splenomegaly. - Status post Imbruvica  started in 2022. - Patient seen in outpatient oncology office on 08/16/2023: Treatment options discussed vs hospice care.  Patient opted for R-mini CHOP received 5/21. - C2D1 due 6/11, chemo on hold at this time.  -- WBC 14.1 today - Medical oncology/Dr. 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 Not candidate for stenting per Urology.    Concern for TLS  AKI on CKD  -Status post 1 dose rasburicase -repeat if needed - Creatinine and BUN continues to improve. - Nephrology following   History of hepatitis B S Ag positivity  - appreciate ID eval.   -- Recommends entecavir  as less impact on kidney function.  Okay to start Rituximab in 1 week post entecavir . Recommendation is usually 1-2 wk of hep b treatment before starting rituxan.   -- Entecavir  initiation 08/30/23, continue as ordered   History of autoimmune hemolytic anemia - Has been monitored closely - HGB low 9.1 today, status post transfusion for hemoglobin 6.6. - Recommend PRBC transfusion for Hgb <7.0 - Continue to monitor CBC with differential   Fatigue Generalized weakness Nausea/vomiting Failure to thrive - Ongoing - Continue PT/OT - Continue IV antiemetics and supportive care   Dysphagia -- patient is having significant dysphagia.  Previously seen by GI  outpatient, she was rx for esophageal candidiasis but also had possible dysmotility vs stricture that might need dilatation. -- Swallow eval done -- GI consult done, s/p EGD with dilation for benign stricture on 5/29.  Path negative for malignancy.   Thrombocytopenia -- mild -- platelets improving 131k -- No intervention required at this time -- Continue to monitor CBC with diff    Code Status Full  Subjective:  Patient seen awake and alert sitting in chair at bedside.  Reports that she feels okay.  Denies pain or GI symptoms.  Family at bedside.  No other acute distress is noted.  Objective:   Intake/Output Summary (Last 24 hours) at 09/04/2023 0949 Last data filed at 09/04/2023 0221 Gross per 24 hour  Intake 120 ml  Output 400 ml  Net -280 ml     PHYSICAL EXAMINATION: ECOG PERFORMANCE STATUS: 3 - Symptomatic, >50% confined to bed  Vitals:   09/03/23 1943 09/04/23 0545  BP: (!) 137/57 120/60  Pulse: 71 69  Resp: 18 16  Temp: (!) 97.5 F (36.4 C) 98.3 F (36.8 C)  SpO2: 95% 99%   Filed Weights   09/01/23 0410 09/02/23 0500 09/04/23 0500  Weight: 143 lb 8.3 oz (65.1 kg) 146 lb 6.2 oz (66.4 kg) 147 lb 7.8 oz (66.9 kg)    GENERAL: alert, + ill appearing  SKIN: skin color, texture, turgor are normal, no rashes or significant lesions EYES: normal, conjunctiva are pink and non-injected, sclera clear OROPHARYNX: no exudate, no erythema and lips, buccal mucosa, and tongue normal  NECK: supple, thyroid  normal size, non-tender, without nodularity LYMPH:  no palpable lymphadenopathy in the cervical, axillary or inguinal LUNGS: clear to auscultation and percussion with normal breathing effort HEART: +bil Upper extremity edema Regular rate & rhythm and no murmurs and no lower extremity edema ABDOMEN: abdomen soft, non-tender and normal bowel sounds MUSCULOSKELETAL: no cyanosis of digits and no clubbing  PSYCH: alert & oriented x 3 with fluent speech NEURO: no focal  motor/sensory deficits   All questions were answered. The patient knows to call the clinic with any problems, questions or concerns.   The total time spent in the appointment was 40 minutes encounter with patient including review of chart and various tests results, discussions about plan of care and coordination of care plan  Jacqueline Mate, NP 09/04/2023 9:49 AM    Labs Reviewed:  Lab Results  Component Value Date   WBC 14.1 (H) 09/04/2023   HGB 9.1 (L) 09/04/2023   HCT 30.9 (L) 09/04/2023   MCV 93.4 09/04/2023   PLT 131 (L) 09/04/2023   Recent Labs    08/28/23 1518 08/29/23 0424 08/30/23 0539 08/31/23 0420 09/01/23 0516 09/02/23 0423 09/03/23 0258 09/04/23 0226  NA 136 133* 139 141 141 142 141 141  K 5.3* 4.6 4.4 4.5 4.6 4.4 4.4 4.4  CL 104 100 100 101 106 105 106 107  CO2 15* 19* 23 25 26 24 24 23   GLUCOSE 117* 126* 131* 96 90 86 79 82  BUN 132* 126* 115* 113* 99* 85* 70* 53*  CREATININE 4.23* 4.11* 3.94* 3.64* 3.38* 3.43* 2.79* 2.63*  CALCIUM 8.3* 7.1* 6.7* 6.7* 6.6* 7.0* 7.5* 8.0*  GFRNONAA 10* 10* 11* 12* 13* 13* 16* 18*  PROT 6.1* 5.1* 4.8*  --   --   --   --   --   ALBUMIN  2.9* 2.3* 2.2* 2.2* 2.1*  --   --   --   AST 24 21 21   --   --   --   --   --   ALT 21 17 13   --   --   --   --   --   ALKPHOS 123 92 84  --   --   --   --   --   BILITOT 0.5 0.6 0.8  --   --   --   --   --   BILIDIR  --  0.2  --   --   --   --   --   --   IBILI  --  0.4  --   --   --   --   --   --     Studies Reviewed:  VAS US  UPPER EXTREMITY VENOUS DUPLEX Result Date: 09/03/2023 UPPER VENOUS STUDY  Patient Name:  Jacqueline Hancock  Date of Exam:   09/03/2023 Medical Rec #: [id number]    Accession #:    [id number] Date of Birth: 11/06/1940    Patient Gender: F Patient Age:   77 years Exam Location:  [facility] Procedure:      VAS US  UPPER EXTREMITY VENOUS DUPLEX Referring Phys: STEPHEN CHIU --------------------------------------------------------------------------------  Indications:  Swelling Risk Factors: Cancer. Comparison Study: No prior studies. Performing Technologist: Lerry Ransom RVT  Examination Guidelines: A complete evaluation includes B-mode imaging, spectral Doppler, color Doppler, and power Doppler as needed of all accessible portions of each vessel. Bilateral testing is considered an integral part of a complete examination. Limited examinations for reoccurring indications may be performed as noted.  Right Findings: +----------+------------+---------+-----------+----------+-------+ RIGHT  CompressiblePhasicitySpontaneousPropertiesSummary +----------+------------+---------+-----------+----------+-------+ IJV           Full       Yes       Yes                      +----------+------------+---------+-----------+----------+-------+ Subclavian               Yes       Yes                      +----------+------------+---------+-----------+----------+-------+ Axillary      Full       Yes       Yes                      +----------+------------+---------+-----------+----------+-------+ Brachial      Full                                          +----------+------------+---------+-----------+----------+-------+ Radial        Full                                          +----------+------------+---------+-----------+----------+-------+ Ulnar         Full                                          +----------+------------+---------+-----------+----------+-------+ Cephalic      Full                                          +----------+------------+---------+-----------+----------+-------+ Basilic       Full                                          +----------+------------+---------+-----------+----------+-------+  Left Findings: +----------+------------+---------+-----------+----------+-------+ LEFT      CompressiblePhasicitySpontaneousPropertiesSummary +----------+------------+---------+-----------+----------+-------+  IJV           Full       Yes       Yes                      +----------+------------+---------+-----------+----------+-------+ Subclavian               Yes       Yes                      +----------+------------+---------+-----------+----------+-------+ Axillary      Full       Yes       Yes                      +----------+------------+---------+-----------+----------+-------+ Brachial      Full                                          +----------+------------+---------+-----------+----------+-------+ Radial        Full                                          +----------+------------+---------+-----------+----------+-------+  Ulnar         Full                                          +----------+------------+---------+-----------+----------+-------+ Cephalic      Full                                          +----------+------------+---------+-----------+----------+-------+ Basilic       Full                                          +----------+------------+---------+-----------+----------+-------+  Summary:  Right: No evidence of deep vein thrombosis in the upper extremity. No evidence of superficial vein thrombosis in the upper extremity.  Left: No evidence of deep vein thrombosis in the upper extremity. No evidence of superficial vein thrombosis in the upper extremity.  *See table(s) above for measurements and observations.     Preliminary    CT ABDOMEN PELVIS WO CONTRAST Result Date: 08/28/2023 CLINICAL DATA:  Failure to thrive. 1st chemotherapy this week. Known lymphoma. * Tracking Code: BO * EXAM: CT ABDOMEN AND PELVIS WITHOUT CONTRAST TECHNIQUE: Multidetector CT imaging of the abdomen and pelvis was performed following the standard protocol without IV contrast. RADIATION DOSE REDUCTION: This exam was performed according to the departmental dose-optimization program which includes automated exposure control, adjustment of the mA and/or kV according  to patient size and/or use of iterative reconstruction technique. COMPARISON:  08/09/2023 FINDINGS: Lower chest: Initial thickening at the lung bases likely due to subsegmental atelectasis and/or air trapping. Normal heart size without pericardial or pleural effusion. Hepatobiliary: Normal noncontrast appearance of the liver, gallbladder. No gross intrahepatic biliary duct dilatation. Pancreas: Normal, without mass or ductal dilatation. Spleen: Normal in size, without focal abnormality. Adrenals/Urinary Tract: Normal left adrenal gland. No left renal calculi. Mild left-sided caliectasis is unchanged. The dominant mass centered in the right abdominal retroperitoneum is amorphous, ill-defined and difficult to on this noncontrast exam. Estimated at 12.6 by 9.6 cm on 38/2, relatively similar to 08/09/2023. This is encompasses and obscures the right adrenal gland right kidney. Amorphous soft tissue extends into the left-sided abdominal retroperitoneum including on 43/2, relatively similar. Soft tissue thickening is most apparent about the posterior bladder wall with suggestion of soft tissue fullness about the right bladder base including on 73/2. This is less masslike than on the prior exam. Stomach/Bowel: Tiny hiatal hernia. No gastric outlet obstruction. Normal colon and terminal ileum. Normal small bowel caliber. Vascular/Lymphatic: Aortic atherosclerosis. Prominent but not pathologically sized abdominal retroperitoneal nodes. Reproductive: Hysterectomy.  No adnexal mass. Other: Pelvic floor laxity. Small volume perihepatic ascites, decreased. Musculoskeletal: No acute osseous abnormality. IMPRESSION: 1. Limited exam secondary to lack of oral or IV contrast. 2. Grossly similar appearance of amorphous, ill-defined soft tissue density mass within the abdominopelvic right-sided retroperitoneum. Extension or adjacent presumed lymphoma within the left-sided abdominal retroperitoneum. 3. No gross bowel obstruction or other  acute complication. 4. Similar mild left-sided caliectasis, likely due to left abdominal retroperitoneal lymphoma. 5. Soft tissue thickening about the posterior bladder wall with decreased soft tissue fullness about the right bladder base. Suspicious for lymphomatous involvement. 6. Decrease in small volume perihepatic ascites 7. Incidental findings,  including: Aortic Atherosclerosis (ICD10-I70.0). Tiny hiatal hernia. Electronically Signed   By: Lore Rode M.D.   On: 08/28/2023 16:51   DG Chest Portable 1 View Result Date: 08/28/2023 CLINICAL DATA:  Weakness, lightheadedness, dizziness, history of lymphoma EXAM: PORTABLE CHEST 1 VIEW COMPARISON:  08/15/2023 FINDINGS: Single frontal view of the chest demonstrates right chest wall port via internal jugular approach, tip overlying superior vena cava. Cardiac silhouette is unremarkable. No acute airspace disease, effusion, or pneumothorax. No acute bony abnormalities. IMPRESSION: 1. No acute intrathoracic process. Electronically Signed   By: Bobbye Burrow M.D.   On: 08/28/2023 15:31   ECHOCARDIOGRAM COMPLETE Result Date: 08/22/2023    ECHOCARDIOGRAM REPORT   Patient Name:   NAVIYAH SCHAFFERT Date of Exam: 08/22/2023 Medical Rec #:  [id number]   Height:       59.0 in Accession #:    [id number]  Weight:       140.0 lb Date of Birth:  06-29-1940   BSA:          1.585 m Patient Age:    83 years    BP:           114/63 mmHg Patient Gender: F           HR:           78 bpm. Exam Location:  Inpatient Procedure: 2D Echo, Color Doppler and Cardiac Doppler (Both Spectral and Color            Flow Doppler were utilized during procedure). Indications:    Chemo  History:        Patient has prior history of Echocardiogram examinations, most                 recent 03/22/2023.  Sonographer:    Andrena Bang Referring Phys: [id number] Frankie Israel  Sonographer Comments: Known apical displaced papillary muscle in LV. IMPRESSIONS  1. Left ventricular ejection fraction, by estimation, is  60 to 65%. The left ventricle has normal function. The left ventricle has no regional wall motion abnormalities. Left ventricular diastolic parameters are consistent with Grade I diastolic dysfunction (impaired relaxation). The average left ventricular global longitudinal strain is -17.5 %. The global longitudinal strain is normal.  2. Right ventricular systolic function is normal. The right ventricular size is normal.  3. The mitral valve is normal in structure. Mild to moderate mitral valve regurgitation. No evidence of mitral stenosis.  4. The aortic valve is grossly normal. Aortic valve regurgitation is trivial. No aortic stenosis is present.  5. The inferior vena cava is normal in size with greater than 50% respiratory variability, suggesting right atrial pressure of 3 mmHg. Comparison(s): No significant change from prior study. Prior images reviewed side by side. FINDINGS  Left Ventricle: Left ventricular ejection fraction, by estimation, is 60 to 65%. The left ventricle has normal function. The left ventricle has no regional wall motion abnormalities. The average left ventricular global longitudinal strain is -17.5 %. Strain was performed and the global longitudinal strain is normal. The left ventricular internal cavity size was normal in size. There is no left ventricular hypertrophy. Left ventricular diastolic parameters are consistent with Grade I diastolic dysfunction (impaired relaxation). Normal left ventricular filling pressure. Right Ventricle: The right ventricular size is normal. No increase in right ventricular wall thickness. Right ventricular systolic function is normal. Left Atrium: Left atrial size was normal in size. Right Atrium: Right atrial size was normal in size. Pericardium: There is no evidence of pericardial  effusion. Mitral Valve: The mitral valve is normal in structure. Mild to moderate mitral valve regurgitation, with centrally-directed jet. No evidence of mitral valve stenosis.  Tricuspid Valve: The tricuspid valve is normal in structure. Tricuspid valve regurgitation is mild . No evidence of tricuspid stenosis. Aortic Valve: The aortic valve is grossly normal. Aortic valve regurgitation is trivial. No aortic stenosis is present. Aortic valve mean gradient measures 4.0 mmHg. Aortic valve peak gradient measures 10.9 mmHg. Aortic valve area, by VTI measures 1.86 cm. Pulmonic Valve: The pulmonic valve was normal in structure. Pulmonic valve regurgitation is not visualized. No evidence of pulmonic stenosis. Aorta: The aortic root is normal in size and structure. Venous: The inferior vena cava is normal in size with greater than 50% respiratory variability, suggesting right atrial pressure of 3 mmHg. IAS/Shunts: No atrial level shunt detected by color flow Doppler.  LEFT VENTRICLE PLAX 2D LVIDd:         3.90 cm     Diastology LVIDs:         2.20 cm     LV e' medial:    8.49 cm/s LV PW:         1.00 cm     LV E/e' medial:  7.9 LV IVS:        0.90 cm     LV e' lateral:   10.60 cm/s LVOT diam:     1.70 cm     LV E/e' lateral: 6.3 LV SV:         46 LV SV Index:   29          2D Longitudinal Strain LVOT Area:     2.27 cm    2D Strain GLS Avg:     -17.5 %  LV Volumes (MOD) LV vol d, MOD A2C: 49.6 ml LV vol d, MOD A4C: 54.7 ml LV vol s, MOD A2C: 16.9 ml LV vol s, MOD A4C: 24.9 ml LV SV MOD A2C:     32.7 ml LV SV MOD A4C:     54.7 ml LV SV MOD BP:      32.7 ml RIGHT VENTRICLE RV S prime:     16.90 cm/s TAPSE (M-mode): 2.0 cm LEFT ATRIUM             Index LA diam:        3.10 cm 1.96 cm/m LA Vol (A2C):   22.3 ml 14.07 ml/m LA Vol (A4C):   24.7 ml 15.58 ml/m LA Biplane Vol: 24.1 ml 15.21 ml/m  AORTIC VALVE AV Area (Vmax):    1.69 cm AV Area (Vmean):   1.85 cm AV Area (VTI):     1.86 cm AV Vmax:           165.00 cm/s AV Vmean:          86.200 cm/s AV VTI:            0.247 m AV Peak Grad:      10.9 mmHg AV Mean Grad:      4.0 mmHg LVOT Vmax:         123.00 cm/s LVOT Vmean:        70.300 cm/s LVOT  VTI:          0.202 m LVOT/AV VTI ratio: 0.82 MITRAL VALVE               TRICUSPID VALVE MV Area (PHT): 4.33 cm    TR Peak grad:   33.4 mmHg MV Decel Time: 175 msec  TR Vmax:        289.00 cm/s MR Peak grad: 147.4 mmHg MR Mean grad: 111.0 mmHg   SHUNTS MR Vmax:      607.00 cm/s  Systemic VTI:  0.20 m MR Vmean:     500.0 cm/s   Systemic Diam: 1.70 cm MV E velocity: 66.70 cm/s MV A velocity: 99.00 cm/s MV E/A ratio:  0.67 Mihai Croitoru MD Electronically signed by Luana Rumple MD Signature Date/Time: 08/22/2023/2:33:18 PM    Final    IR IMAGING GUIDED PORT INSERTION Result Date: 08/21/2023 CLINICAL DATA:  B-CELL LYMPHOMA, ACCESS FOR CHEMOTHERAPY EXAM: RIGHT INTERNAL JUGULAR SINGLE LUMEN POWER PORT CATHETER INSERTION Date:  08/21/2023 08/21/2023 9:33 am Radiologist:  Melven Stable. Alyssa Jumper, MD Guidance:  Ultrasound and fluoroscopic MEDICATIONS: 1% lidocaine  local with epinephrine  ANESTHESIA/SEDATION: Versed  1.0 mg IV; Fentanyl  50 mcg IV; Moderate Sedation Time:  28 minute The patient was continuously monitored during the procedure by the interventional radiology nurse under my direct supervision. FLUOROSCOPY: One minutes, 24 seconds (15 mGy) COMPLICATIONS: None immediate. CONTRAST:  None. PROCEDURE: Informed consent was obtained from the patient following explanation of the procedure, risks, benefits and alternatives. The patient understands, agrees and consents for the procedure. All questions were addressed. A time out was performed. Maximal barrier sterile technique utilized including caps, mask, sterile gowns, sterile gloves, large sterile drape, hand hygiene, and 2% chlorhexidine  scrub. Under sterile conditions and local anesthesia, right internal JV micropuncture venous access was performed. Access was performed with ultrasound. Images were obtained for documentation of the patent right internal jugular vein. A guide wire was inserted followed by a transitional dilator. This allowed insertion of a guide wire and  catheter into the IVC. Measurements were obtained from the SVC / RA junction back to the right IJ venotomy site. In the right infraclavicular chest, a subcutaneous pocket was created over the second anterior rib. This was done under sterile conditions and local anesthesia. 1% lidocaine  with epinephrine  was utilized for this. A 2.5 cm incision was made in the skin. Blunt dissection was performed to create a subcutaneous pocket over the right pectoralis major muscle. The pocket was flushed with saline vigorously. There was adequate hemostasis. The port catheter was assembled and checked for leakage. The port catheter was secured in the pocket with two retention sutures. The tubing was tunneled subcutaneously to the right venotomy site and inserted into the SVC/RA junction through a valved peel-away sheath. Position was confirmed with fluoroscopy. Images were obtained for documentation. The patient tolerated the procedure well. No immediate complications. Incisions were closed in a two layer fashion with 4 - 0 Vicryl suture. Dermabond was applied to the skin. The port catheter was accessed, blood was aspirated followed by saline and heparin  flushes. Needle was removed. A dry sterile dressing was applied. IMPRESSION: Ultrasound and fluoroscopically guided right internal jugular single lumen power port catheter insertion. Tip in the SVC/RA junction. Catheter ready for use. Electronically Signed   By: Melven Stable.  Shick M.D.   On: 08/21/2023 09:57   DG ESOPHAGUS W SINGLE CM (SOL OR THIN BA) Addendum Date: 08/19/2023 ADDENDUM REPORT: 08/19/2023 08:35 ADDENDUM: Addendum for clarification of body of report. Hiatus hernia without evidence of stricture. Electronically Signed   By: Art Largo M.D.   On: 08/19/2023 08:35   Result Date: 08/19/2023 CLINICAL DATA:  dysphagia 83 year old female with dysphagia and globus sensation for esophagram. EXAM: ESOPHAGUS/BARIUM SWALLOW/TABLET STUDY TECHNIQUE: Single contrast examination was  performed using thin liquid barium. This exam was  performed by Abigail C. Emerson, PA-C, and was supervised and interpreted by Dr. Darylene Epley. FLUOROSCOPY: Radiation Exposure Index and estimated peak skin dose (PSD); Reference air kerma (RAK), 29.4 mGy. Kerma-area product (KAP), 409.9 uGy*m. COMPARISON:  CT AP, 12/02/2022 FINDINGS: Swallowing: Appears normal. No vestibular penetration or aspiration seen. Pharynx: Unremarkable. Esophagus: No mucosal lesions. Smooth, tapering stricture above gastroesophageal junction. Esophageal motility: Moderate esophageal dysmotility visualized with resultant contrast stasis and delayed emptying into the stomach. Hiatal Hernia: Mild hiatal hernia. Gastroesophageal reflux: None visualized, even with provacative maneuvers. Ingested 13mm barium tablet: Became stuck just above the gastroesophageal junction, where the tapering stricture is visualized. Waited 3 minutes with subsequent swallows of water and barium, tablet remained stuck. Pt informed the tablet will dissolve on its own. Other: None. IMPRESSION: Suboptimal evaluation, secondary to patient's inability to remain upright. 1. Small hiatus hernia. 2. 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 Performed By Lorinda Root, PA-C and supervised by Art Largo, MD Electronically Signed: By: Art Largo M.D. On: 08/18/2023 09:35   DG Chest 2 View Result Date: 08/15/2023 CLINICAL DATA:  Shortness of breath. EXAM: CHEST - 2 VIEW COMPARISON:  Aug 09, 2023. FINDINGS: The heart size and mediastinal contours are within normal limits. Both lungs are clear. The visualized skeletal structures are unremarkable. IMPRESSION: No active cardiopulmonary disease. Electronically Signed   By: Rosalene Colon M.D.   On: 08/15/2023 11:25   CT ABDOMEN PELVIS WO CONTRAST Result Date: 08/09/2023 CLINICAL DATA:  Abdominal pain no known history of lymphoma and recently positive biopsy EXAM: CT  ABDOMEN AND PELVIS WITHOUT CONTRAST TECHNIQUE: Multidetector CT imaging of the abdomen and pelvis was performed following the standard protocol without IV contrast. RADIATION DOSE REDUCTION: This exam was performed according to the departmental dose-optimization program which includes automated exposure control, adjustment of the mA and/or kV according to patient size and/or use of iterative reconstruction technique. COMPARISON:  08/01/2023 FINDINGS: Lower chest: Previously seen patchy ground-glass opacities are again seen but improved when compared with the prior study. Hepatobiliary: Persistent perihepatic fluid is noted. The gallbladder is within normal limits. Pancreas: Unremarkable. No pancreatic ductal dilatation or surrounding inflammatory changes. Spleen: Normal in size without focal abnormality. Mild perisplenic fluid is noted. Adrenals/Urinary Tract: Left adrenal gland is within normal limits. Left kidney shows no acute abnormality. No obstructive changes are seen.The right kidney is envelop by a large soft tissue mass stable in appearance from the recent exam as well as a recent MRI. 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 The surrounding soft tissues extend inferiorly similar to that seen on the prior exam. The bladder is decompressed. Stomach/Bowel: No obstructive or inflammatory changes of the colon are seen. The appendix is not well visualized. Small bowel and stomach are unremarkable. Vascular/Lymphatic: Aortic atherosclerosis. No enlarged abdominal or pelvic lymph nodes. Reproductive: Uterus has been surgically removed. Other: No abdominal wall hernia or abnormality. No abdominopelvic ascites. Musculoskeletal: No acute bony abnormality is noted. IMPRESSION: Stable appearing soft tissue mass lesion in the right abdomen envelop ping the right kidney with mild hydronephrosis as well as extending superiorly towards the  liver and inferiorly into the pelvis. 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 Mild free fluid is noted within the abdomen stable from the prior study. No new focal abnormality is seen. Electronically Signed   By: Violeta Grey M.D.   On: 08/09/2023 22:51   DG Chest 2 View Result  Date: 08/09/2023 CLINICAL DATA:  Shortness of breath EXAM: CHEST - 2 VIEW COMPARISON:  08/01/2023 FINDINGS: Heart mediastinal contours within normal limits. Questionable ill-defined patchy lower lobe opacities. No visible effusions or pneumothorax. No acute bony abnormality. IMPRESSION: Questionable vague lower lobe opacities, possible atelectasis or pneumonia. Electronically Signed   By: Janeece Mechanic M.D.   On: 08/09/2023 21:14   ADDENDUM  .Patient was Personally and independently interviewed, examined and relevant elements of the history of present illness were reviewed in details and an assessment and plan was created. All elements of the patient's history of present illness , assessment and plan were discussed in details with Lisa Rouson NP. The above documentation reflects our combined findings assessment and plan.   Patient notes improved p.o. intake with food and fluids and starting to work with physical therapy.  Still quite fatigued and somewhat flat affect.  Renal function is improved close to her pretreatment baseline. 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 Will potentially need home PT versus SNF. Next dose of R mini CHOP will be around 09/13/2023. Continue goals of care discussion.  Daison Braxton MD MS

## 2023-09-05 ENCOUNTER — Other Ambulatory Visit: Payer: Self-pay

## 2023-09-05 ENCOUNTER — Other Ambulatory Visit (HOSPITAL_COMMUNITY): Payer: Self-pay

## 2023-09-05 DIAGNOSIS — C8338 Diffuse large B-cell lymphoma, lymph nodes of multiple sites: Secondary | ICD-10-CM | POA: Diagnosis not present

## 2023-09-05 DIAGNOSIS — R531 Weakness: Secondary | ICD-10-CM | POA: Diagnosis not present

## 2023-09-05 DIAGNOSIS — C9110 Chronic lymphocytic leukemia of B-cell type not having achieved remission: Secondary | ICD-10-CM | POA: Diagnosis not present

## 2023-09-05 DIAGNOSIS — N179 Acute kidney failure, unspecified: Secondary | ICD-10-CM | POA: Diagnosis not present

## 2023-09-05 DIAGNOSIS — E883 Tumor lysis syndrome: Secondary | ICD-10-CM | POA: Diagnosis not present

## 2023-09-05 LAB — COMPREHENSIVE METABOLIC PANEL WITH GFR
ALT: 14 U/L (ref 0–44)
AST: 28 U/L (ref 15–41)
Albumin: 2.2 g/dL — ABNORMAL LOW (ref 3.5–5.0)
Alkaline Phosphatase: 129 U/L — ABNORMAL HIGH (ref 38–126)
Anion gap: 7 (ref 5–15)
BUN: 44 mg/dL — ABNORMAL HIGH (ref 8–23)
CO2: 22 mmol/L (ref 22–32)
Calcium: 7.9 mg/dL — ABNORMAL LOW (ref 8.9–10.3)
Chloride: 109 mmol/L (ref 98–111)
Creatinine, Ser: 2.25 mg/dL — ABNORMAL HIGH (ref 0.44–1.00)
GFR, Estimated: 21 mL/min — ABNORMAL LOW (ref >60)
Glucose, Bld: 82 mg/dL (ref 70–99)
Potassium: 4.5 mmol/L (ref 3.5–5.1)
Sodium: 138 mmol/L (ref 135–145)
Total Bilirubin: 0.8 mg/dL (ref 0.0–1.2)
Total Protein: 4.6 g/dL — ABNORMAL LOW (ref 6.5–8.1)

## 2023-09-05 LAB — CBC
HCT: 29 % — ABNORMAL LOW (ref 36.0–46.0)
Hemoglobin: 8.5 g/dL — ABNORMAL LOW (ref 12.0–15.0)
MCH: 27.5 pg (ref 26.0–34.0)
MCHC: 29.3 g/dL — ABNORMAL LOW (ref 30.0–36.0)
MCV: 93.9 fL (ref 80.0–100.0)
Platelets: 127 10*3/uL — ABNORMAL LOW (ref 150–400)
RBC: 3.09 MIL/uL — ABNORMAL LOW (ref 3.87–5.11)
RDW: 13.8 % (ref 11.5–15.5)
WBC: 14.5 10*3/uL — ABNORMAL HIGH (ref 4.0–10.5)
nRBC: 0 % (ref 0.0–0.2)

## 2023-09-05 MED ORDER — ENTECAVIR 0.5 MG PO TABS
0.5000 mg | ORAL_TABLET | ORAL | 0 refills | Status: DC
  Filled 2023-09-05: qty 10, 30d supply, fill #0

## 2023-09-05 MED ORDER — PANTOPRAZOLE SODIUM 40 MG PO TBEC
40.0000 mg | DELAYED_RELEASE_TABLET | Freq: Two times a day (BID) | ORAL | Status: DC
  Administered 2023-09-05: 40 mg via ORAL
  Filled 2023-09-05: qty 1

## 2023-09-05 MED ORDER — HEPARIN SOD (PORK) LOCK FLUSH 100 UNIT/ML IV SOLN
500.0000 [IU] | INTRAVENOUS | Status: AC | PRN
  Administered 2023-09-05: 500 [IU]

## 2023-09-05 MED ORDER — SUCRALFATE 1 GM/10ML PO SUSP
1.0000 g | Freq: Two times a day (BID) | ORAL | 0 refills | Status: DC
Start: 2023-09-05 — End: 2023-10-12
  Filled 2023-09-05: qty 473, 24d supply, fill #0

## 2023-09-05 MED ORDER — MIDODRINE HCL 10 MG PO TABS
10.0000 mg | ORAL_TABLET | Freq: Two times a day (BID) | ORAL | 0 refills | Status: AC
  Filled 2023-09-05: qty 60, 30d supply, fill #0

## 2023-09-05 MED ORDER — KATE FARMS STANDARD 1.4 EN LIQD
325.0000 mL | Freq: Two times a day (BID) | ENTERAL | Status: DC
  Administered 2023-09-05: 325 mL via ORAL
  Filled 2023-09-05: qty 325
  Filled 2023-09-05: qty 1000

## 2023-09-05 MED ORDER — PANTOPRAZOLE SODIUM 40 MG PO TBEC
DELAYED_RELEASE_TABLET | ORAL | 0 refills | Status: DC
  Filled 2023-09-05: qty 142, 86d supply, fill #0

## 2023-09-05 NOTE — Progress Notes (Signed)
 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 - Encourage intake at all meals and of supplements. - Monitor weight trends.   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 *ongoing  GOAL:   Patient will meet greater than or equal to 90% of their needs *progressing  MONITOR:   PO intake, Supplement acceptance, Diet advancement, Labs, Weight trends  REASON FOR ASSESSMENT:   Consult Assessment of nutrition requirement/status  ASSESSMENT:   83 y.o. 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 Admitted for acute renal failure.  5/26 Admit; renal diet 5/27 DYS 1 diet 5/28 DYS 2 diet 5/29 Soft diet  Patient reports she has been able to eat better the past few days and her appetite has finally improved. She endorses ordering and trying to consume 3 meals a day. Shares had 100% of her breakfast this AM. She is documented to be consuming 25-75% of meals.  She does not like [employer] as it gives her gas and doesn't like [employer]. She notes lactose tends to bother her. She is agreeable to try [employer] for a plant based option. Encouraged patient to continue to try and consume 3 meals a day in addition to adding snacks/supplements in between meals.     Admit weight: 140# Current weight: 147# I&O's: +4.3L since admit + for non-pitting generalized edema and mild pitting RLE/LLE edema  Medications reviewed and include: Senokot   Labs  reviewed: Creatinine 2.25 HA1C 5.7    Diet Order:   Diet Order             DIET SOFT Room service appropriate? Yes; Fluid consistency: Thin  Diet effective now                   EDUCATION NEEDS:  Education needs have been addressed  Skin:  Skin Assessment: Reviewed RN Assessment  Last BM:  6/1  Height:  Ht Readings from Last 1 Encounters:  08/31/23 4\' 11"  (1.499 m)   Weight:  Wt Readings from Last 1 Encounters:  09/05/23 67.1 kg    BMI:  Body mass index is 29.88 kg/m.  Estimated Nutritional Needs:  Kcal:  1700-1900 kcals Protein:  75-90 grams Fluid:  >/= 1.7L    Scheryl Cushing RD, LDN Contact via Secure Chat.

## 2023-09-05 NOTE — Discharge Summary (Addendum)
 Physician Discharge Summary   Patient: Jacqueline Hancock MRN: [id number] DOB: 1940-12-25  Admit date:     08/28/2023  Discharge date: 09/05/23  Discharge Physician: Cherylle Corwin   PCP: Estil Heman, NP   Recommendations at discharge:    Follow up with PCP in 1-2 weeks Follow up with Nephrology as scheduled Follow up with ID as scheduled Follow up with Dr. 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 *  Hospital Course: 83 y.o. 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  Denies any nausea vomiting diarrhea fever chills chest pain or shortness of breath.  Has abdominal fullness.  Due to persistent weakness patient was brought to the ER by the patient's family.   Dr. Salomon Cree who advised starting patient on rasburicase  for possible tumor lysis syndrome and also ER physician discussed with Dr. Edson Graces who advised starting patient on bicarbonate infusion.  CT abdomen pelvis does not show anything acute but shows chronic changes.   Assessment and Plan: Acute renal failure on chronic kidney disease stage III with concern for possible tumor lysis syndrome with effective solitary functioning  kidney from mass encompassing R kidney -     Nephrology and oncology had been following Pt was given rasburicase   Pt was given sodium bicarb infusion. Per Nephrology, concerns that pt would not be an ideal dialysis candidate.  Pt seen by Palliative Care. Wishes for full code, full scope of treatment noted Cr down to 2.79, Per Nephrology, Cr is stable and is likely new baseline. Good urine output with no signs of uremia. CKA to f/u as outpt Not candidate for nephrostomy tube per IR. Per Urology, not candidate for stenting Plan for discharge home, hospital be to be delivered   Leukocytosis -    Oncology following, who felt that the leukocytosis could be from patient receiving G-CSF after her recent chemo. B blood culture results neg. WBC normalized   Hypotension blood pressure had been soft recently Pt was continued on midodrine  5 mg p.o. 3 times daily, discharge on BID midodrine  on d/c BP stable. Would begin to wean soon   Diastolic dysfunction  2D echo done on 08/22/2023 which showed EF of 60 to 65% with grade 1 diastolic dysfunction.   Diuretics were held due to worsening renal function.   Large B-cell lymphoma transformation and CLL being followed by oncologist had received recently mini CHOP regimen Oncology following    Anemia Hgb down to 6.6 on 5/31 Hgb improved with 1 unit prbc tx on 5/31   History of hepatitis B surface antigen positivity ID following Per ID, plan to continue entecavir . ID f.u at end of July to check labs Dysphagia GI was consulted  Pt now s/p EGD 5/29 with findings of benign appearing esophageal stenosis, dilated. Gastritis noted with non-bleeding duodenal ulcers with no stigmata of bleed, biopsied. No evidence of candidiasis GI recs for protonix  40mg  bid x 8 weeks, then 40mg  daily afterwards, carafate  elixir 1g BID x 4 weeks. stopped diflucan        Consultants: GI, Nephrology, Palliative Care, Oncology Procedures performed: EGD  Disposition:  Home Diet recommendation:  Regular diet DISCHARGE MEDICATION: Allergies as of 09/05/2023       Reactions   Nexium [esomeprazole Magnesium] Other (See Comments)   Headache   Peanut Butter Flavoring Agent (non-screening) Itching        Medication List     STOP taking these medications    allopurinol  100 MG tablet Commonly known as: ZYLOPRIM    amLODipine  5 MG tablet Commonly known as: NORVASC    amoxicillin -clavulanate 875-125 MG tablet Commonly known as: AUGMENTIN    azithromycin  250 MG tablet Commonly known as: ZITHROMAX    furosemide  20 MG tablet Commonly known as: LASIX    lidocaine -prilocaine  cream Commonly known as: EMLA    losartan  50 MG tablet Commonly known as: COZAAR    nadolol  20 MG tablet Commonly known as: CORGARD    nystatin  100000 UNIT/ML suspension Commonly known as: MYCOSTATIN    omeprazole  20 MG capsule Commonly known as: PRILOSEC   ondansetron  8 MG tablet Commonly known as: Zofran    predniSONE  20 MG tablet Commonly known as: DELTASONE    torsemide  20 MG tablet Commonly known as: DEMADEX        TAKE these medications    entecavir  0.5 MG tablet Commonly known as: BARACLUDE  Take 1 tablet (0.5 mg total) by mouth every 3 (three) days. Start taking on: September 08, 2023   loratadine 10 MG tablet Commonly known as: CLARITIN Take 10 mg by mouth daily.   midodrine  10 MG tablet Commonly known as: PROAMATINE  Take 1 tablet (10 mg total) by mouth 2 (two) times daily with a meal.   pantoprazole  40 MG tablet Commonly known as: PROTONIX  Take 1 tablet (40 mg total) by mouth 2 (two) times daily for 56 days, THEN 1 tablet (40 mg total) daily. Start taking on: September 05, 2023   polyethylene glycol 17 g packet Commonly known as: MIRALAX  / GLYCOLAX  Take 17 g by mouth daily as needed for mild constipation or moderate constipation. Also available OTC   prochlorperazine  10 MG tablet Commonly known as: COMPAZINE  Take 1 tablet (10 mg total) by mouth every 6  (six) hours as needed for nausea or vomiting.   senna-docusate 8.6-50 MG tablet Commonly known as: Senokot-S Take 2 tablets by mouth at bedtime. For constipation   sucralfate  1 GM/10ML suspension Commonly known as: CARAFATE  Take 10 mLs (1 g total) by mouth 2 (two) times daily for 28 days.   traMADol  50 MG tablet Commonly known as: ULTRAM  Take 1 tablet (50 mg total) by mouth every 8 (eight) hours as needed for moderate pain (pain score 4-6) or severe pain (pain score 7-10) (pain). What changed: when to take this   VITAMIN B-COMPLEX PO Take 1 tablet by mouth daily.               Durable Medical Equipment  (From admission, onward)           Start     Ordered   09/04/23 1716  For home use only DME Hospital bed  Once       Question Answer Comment  Length of Need 6 Months   Bed type Semi-electric  09/04/23 1715            Follow-up Information     [facility], [place] Follow up.   Why: home health will follow up with you after discharge Contact information: [address] [place] [address] [phone]         [facility] [employer] Follow up.   Why: Equipment company that has provided hospital bed Contact information: [address] [place] [address] [phone]         Liane Redman, MD Follow up.   Specialty: Infectious Diseases Why: Hospital follow up Contact information: [address] [place] [address] [phone]                Discharge Exam: Filed Weights   09/02/23 0500 09/04/23 0500 09/05/23 0449  Weight: 66.4 kg 66.9 kg 67.1 kg   General exam: Awake, laying in bed, in nad Respiratory system: Normal respiratory effort, no wheezing Cardiovascular system: regular rate, s1, s2 Gastrointestinal system: Soft, nondistended, positive BS Central nervous system: CN2-12 grossly intact, strength intact Extremities: Perfused, no  clubbing Skin: Normal skin turgor, no notable skin lesions seen Psychiatry: Mood normal // no visual hallucinations   Condition at discharge: fair  The results of significant diagnostics from this hospitalization (including imaging, microbiology, ancillary and laboratory) are listed below for reference.   Imaging Studies: VAS US  UPPER EXTREMITY VENOUS DUPLEX Result Date: 09/04/2023 UPPER VENOUS STUDY  Patient Name:  Jacqueline Hancock  Date of Exam:   09/03/2023 Medical Rec #: [id number]    Accession #:    [id number] Date of Birth: 11/07/1940    Patient Gender: F Patient Age:   77 years Exam Location:  [facility] Procedure:      VAS US  UPPER EXTREMITY VENOUS DUPLEX Referring Phys: Jamesetta Greenhalgh --------------------------------------------------------------------------------  Indications: Swelling Risk Factors: Cancer. Comparison Study: No prior studies. Performing Technologist: Lerry Ransom RVT  Examination Guidelines: A complete evaluation includes B-mode imaging, spectral Doppler, color Doppler, and power Doppler as needed of all accessible portions of each vessel. Bilateral testing is considered an integral part of a complete examination. Limited examinations for reoccurring indications may be performed as noted.  Right Findings: +----------+------------+---------+-----------+----------+-------+ RIGHT     CompressiblePhasicitySpontaneousPropertiesSummary +----------+------------+---------+-----------+----------+-------+ IJV           Full       Yes       Yes                      +----------+------------+---------+-----------+----------+-------+ Subclavian               Yes       Yes                      +----------+------------+---------+-----------+----------+-------+ Axillary      Full       Yes       Yes                      +----------+------------+---------+-----------+----------+-------+ Brachial      Full                                           +----------+------------+---------+-----------+----------+-------+ Radial        Full                                          +----------+------------+---------+-----------+----------+-------+  Ulnar         Full                                          +----------+------------+---------+-----------+----------+-------+ Cephalic      Full                                          +----------+------------+---------+-----------+----------+-------+ Basilic       Full                                          +----------+------------+---------+-----------+----------+-------+  Left Findings: +----------+------------+---------+-----------+----------+-------+ LEFT      CompressiblePhasicitySpontaneousPropertiesSummary +----------+------------+---------+-----------+----------+-------+ IJV           Full       Yes       Yes                      +----------+------------+---------+-----------+----------+-------+ Subclavian               Yes       Yes                      +----------+------------+---------+-----------+----------+-------+ Axillary      Full       Yes       Yes                      +----------+------------+---------+-----------+----------+-------+ Brachial      Full                                          +----------+------------+---------+-----------+----------+-------+ Radial        Full                                          +----------+------------+---------+-----------+----------+-------+ Ulnar         Full                                          +----------+------------+---------+-----------+----------+-------+ Cephalic      Full                                          +----------+------------+---------+-----------+----------+-------+ Basilic       Full                                          +----------+------------+---------+-----------+----------+-------+  Summary:  Right: No evidence of deep vein thrombosis in  the upper extremity. No evidence of superficial vein thrombosis in the upper extremity.  Left: No evidence of deep vein thrombosis in the upper extremity. No evidence of superficial vein thrombosis in the upper extremity.  *See table(s)  above for measurements and observations.  Diagnosing physician: Delaney Fearing Electronically signed by Delaney Fearing on 09/04/2023 at 3:48:36 PM.    Final    CT ABDOMEN PELVIS WO CONTRAST Result Date: 08/28/2023 CLINICAL DATA:  Failure to thrive. 1st chemotherapy this week. Known lymphoma. * Tracking Code: BO * EXAM: CT ABDOMEN AND PELVIS WITHOUT CONTRAST TECHNIQUE: Multidetector CT imaging of the abdomen and pelvis was performed following the standard protocol without IV contrast. RADIATION DOSE REDUCTION: This exam was performed according to the departmental dose-optimization program which includes automated exposure control, adjustment of the mA and/or kV according to patient size and/or use of iterative reconstruction technique. COMPARISON:  08/09/2023 FINDINGS: Lower chest: Initial thickening at the lung bases likely due to subsegmental atelectasis and/or air trapping. Normal heart size without pericardial or pleural effusion. Hepatobiliary: Normal noncontrast appearance of the liver, gallbladder. No gross intrahepatic biliary duct dilatation. Pancreas: Normal, without mass or ductal dilatation. Spleen: Normal in size, without focal abnormality. Adrenals/Urinary Tract: Normal left adrenal gland. No left renal calculi. Mild left-sided caliectasis is unchanged. The dominant mass centered in the right abdominal retroperitoneum is amorphous, ill-defined and difficult to on this noncontrast exam. Estimated at 12.6 by 9.6 cm on 38/2, relatively similar to 08/09/2023. This is encompasses and obscures the right adrenal gland right kidney. Amorphous soft tissue extends into the left-sided abdominal retroperitoneum including on 43/2, relatively similar. Soft tissue thickening is most  apparent about the posterior bladder wall with suggestion of soft tissue fullness about the right bladder base including on 73/2. This is less masslike than on the prior exam. Stomach/Bowel: Tiny hiatal hernia. No gastric outlet obstruction. Normal colon and terminal ileum. Normal small bowel caliber. Vascular/Lymphatic: Aortic atherosclerosis. Prominent but not pathologically sized abdominal retroperitoneal nodes. Reproductive: Hysterectomy.  No adnexal mass. Other: Pelvic floor laxity. Small volume perihepatic ascites, decreased. Musculoskeletal: No acute osseous abnormality. IMPRESSION: 1. Limited exam secondary to lack of oral or IV contrast. 2. Grossly similar appearance of amorphous, ill-defined soft tissue density mass within the abdominopelvic right-sided retroperitoneum. Extension or adjacent presumed lymphoma within the left-sided abdominal retroperitoneum. 3. No gross bowel obstruction or other acute complication. 4. Similar mild left-sided caliectasis, likely due to left abdominal retroperitoneal lymphoma. 5. Soft tissue thickening about the posterior bladder wall with decreased soft tissue fullness about the right bladder base. Suspicious for lymphomatous involvement. 6. Decrease in small volume perihepatic ascites 7. Incidental findings, including: Aortic Atherosclerosis (ICD10-I70.0). Tiny hiatal hernia. Electronically Signed   By: Lore Rode M.D.   On: 08/28/2023 16:51   DG Chest Portable 1 View Result Date: 08/28/2023 CLINICAL DATA:  Weakness, lightheadedness, dizziness, history of lymphoma EXAM: PORTABLE CHEST 1 VIEW COMPARISON:  08/15/2023 FINDINGS: Single frontal view of the chest demonstrates right chest wall port via internal jugular approach, tip overlying superior vena cava. Cardiac silhouette is unremarkable. No acute airspace disease, effusion, or pneumothorax. No acute bony abnormalities. IMPRESSION: 1. No acute intrathoracic process. Electronically Signed   By: Bobbye Burrow M.D.    On: 08/28/2023 15:31   ECHOCARDIOGRAM COMPLETE Result Date: 08/22/2023    ECHOCARDIOGRAM REPORT   Patient Name:   Jacqueline Hancock Date of Exam: 08/22/2023 Medical Rec #:  [id number]   Height:       59.0 in Accession #:    [id number]  Weight:       140.0 lb Date of Birth:  11-Mar-1941   BSA:          1.585 m Patient Age:    63  years    BP:           114/63 mmHg Patient Gender: F           HR:           78 bpm. Exam Location:  Inpatient Procedure: 2D Echo, Color Doppler and Cardiac Doppler (Both Spectral and Color            Flow Doppler were utilized during procedure). Indications:    Chemo  History:        Patient has prior history of Echocardiogram examinations, most                 recent 03/22/2023.  Sonographer:    Andrena Bang Referring Phys: [id number] Frankie Israel  Sonographer Comments: Known apical displaced papillary muscle in LV. IMPRESSIONS  1. Left ventricular ejection fraction, by estimation, is 60 to 65%. The left ventricle has normal function. The left ventricle has no regional wall motion abnormalities. Left ventricular diastolic parameters are consistent with Grade I diastolic dysfunction (impaired relaxation). The average left ventricular global longitudinal strain is -17.5 %. The global longitudinal strain is normal.  2. Right ventricular systolic function is normal. The right ventricular size is normal.  3. The mitral valve is normal in structure. Mild to moderate mitral valve regurgitation. No evidence of mitral stenosis.  4. The aortic valve is grossly normal. Aortic valve regurgitation is trivial. No aortic stenosis is present.  5. The inferior vena cava is normal in size with greater than 50% respiratory variability, suggesting right atrial pressure of 3 mmHg. Comparison(s): No significant change from prior study. Prior images reviewed side by side. FINDINGS  Left Ventricle: Left ventricular ejection fraction, by estimation, is 60 to 65%. The left ventricle has normal function. The left  ventricle has no regional wall motion abnormalities. The average left ventricular global longitudinal strain is -17.5 %. Strain was performed and the global longitudinal strain is normal. The left ventricular internal cavity size was normal in size. There is no left ventricular hypertrophy. Left ventricular diastolic parameters are consistent with Grade I diastolic dysfunction (impaired relaxation). Normal left ventricular filling pressure. Right Ventricle: The right ventricular size is normal. No increase in right ventricular wall thickness. Right ventricular systolic function is normal. Left Atrium: Left atrial size was normal in size. Right Atrium: Right atrial size was normal in size. Pericardium: There is no evidence of pericardial effusion. Mitral Valve: The mitral valve is normal in structure. Mild to moderate mitral valve regurgitation, with centrally-directed jet. No evidence of mitral valve stenosis. Tricuspid Valve: The tricuspid valve is normal in structure. Tricuspid valve regurgitation is mild . No evidence of tricuspid stenosis. Aortic Valve: The aortic valve is grossly normal. Aortic valve regurgitation is trivial. No aortic stenosis is present. Aortic valve mean gradient measures 4.0 mmHg. Aortic valve peak gradient measures 10.9 mmHg. Aortic valve area, by VTI measures 1.86 cm. Pulmonic Valve: The pulmonic valve was normal in structure. Pulmonic valve regurgitation is not visualized. No evidence of pulmonic stenosis. Aorta: The aortic root is normal in size and structure. Venous: The inferior vena cava is normal in size with greater than 50% respiratory variability, suggesting right atrial pressure of 3 mmHg. IAS/Shunts: No atrial level shunt detected by color flow Doppler.  LEFT VENTRICLE PLAX 2D LVIDd:         3.90 cm     Diastology LVIDs:         2.20 cm     LV e' medial:  8.49 cm/s LV PW:         1.00 cm     LV E/e' medial:  7.9 LV IVS:        0.90 cm     LV e' lateral:   10.60 cm/s LVOT  diam:     1.70 cm     LV E/e' lateral: 6.3 LV SV:         46 LV SV Index:   29          2D Longitudinal Strain LVOT Area:     2.27 cm    2D Strain GLS Avg:     -17.5 %  LV Volumes (MOD) LV vol d, MOD A2C: 49.6 ml LV vol d, MOD A4C: 54.7 ml LV vol s, MOD A2C: 16.9 ml LV vol s, MOD A4C: 24.9 ml LV SV MOD A2C:     32.7 ml LV SV MOD A4C:     54.7 ml LV SV MOD BP:      32.7 ml RIGHT VENTRICLE RV S prime:     16.90 cm/s TAPSE (M-mode): 2.0 cm LEFT ATRIUM             Index LA diam:        3.10 cm 1.96 cm/m LA Vol (A2C):   22.3 ml 14.07 ml/m LA Vol (A4C):   24.7 ml 15.58 ml/m LA Biplane Vol: 24.1 ml 15.21 ml/m  AORTIC VALVE AV Area (Vmax):    1.69 cm AV Area (Vmean):   1.85 cm AV Area (VTI):     1.86 cm AV Vmax:           165.00 cm/s AV Vmean:          86.200 cm/s AV VTI:            0.247 m AV Peak Grad:      10.9 mmHg AV Mean Grad:      4.0 mmHg LVOT Vmax:         123.00 cm/s LVOT Vmean:        70.300 cm/s LVOT VTI:          0.202 m LVOT/AV VTI ratio: 0.82 MITRAL VALVE               TRICUSPID VALVE MV Area (PHT): 4.33 cm    TR Peak grad:   33.4 mmHg MV Decel Time: 175 msec    TR Vmax:        289.00 cm/s MR Peak grad: 147.4 mmHg MR Mean grad: 111.0 mmHg   SHUNTS MR Vmax:      607.00 cm/s  Systemic VTI:  0.20 m MR Vmean:     500.0 cm/s   Systemic Diam: 1.70 cm MV E velocity: 66.70 cm/s MV A velocity: 99.00 cm/s MV E/A ratio:  0.67 Mihai Croitoru MD Electronically signed by Luana Rumple MD Signature Date/Time: 08/22/2023/2:33:18 PM    Final    IR IMAGING GUIDED PORT INSERTION Result Date: 08/21/2023 CLINICAL DATA:  B-CELL LYMPHOMA, ACCESS FOR CHEMOTHERAPY EXAM: RIGHT INTERNAL JUGULAR SINGLE LUMEN POWER PORT CATHETER INSERTION Date:  08/21/2023 08/21/2023 9:33 am Radiologist:  Melven Stable. Alyssa Jumper, MD Guidance:  Ultrasound and fluoroscopic MEDICATIONS: 1% lidocaine  local with epinephrine  ANESTHESIA/SEDATION: Versed  1.0 mg IV; Fentanyl  50 mcg IV; Moderate Sedation Time:  28 minute The patient was continuously monitored  during the procedure by the interventional radiology nurse under my direct supervision. FLUOROSCOPY: One minutes, 24 seconds (15 mGy) COMPLICATIONS: None immediate. CONTRAST:  None. PROCEDURE: Informed consent was  obtained from the patient following explanation of the procedure, risks, benefits and alternatives. The patient understands, agrees and consents for the procedure. All questions were addressed. A time out was performed. Maximal barrier sterile technique utilized including caps, mask, sterile gowns, sterile gloves, large sterile drape, hand hygiene, and 2% chlorhexidine  scrub. Under sterile conditions and local anesthesia, right internal JV micropuncture venous access was performed. Access was performed with ultrasound. Images were obtained for documentation of the patent right internal jugular vein. A guide wire was inserted followed by a transitional dilator. This allowed insertion of a guide wire and catheter into the IVC. Measurements were obtained from the SVC / RA junction back to the right IJ venotomy site. In the right infraclavicular chest, a subcutaneous pocket was created over the second anterior rib. This was done under sterile conditions and local anesthesia. 1% lidocaine  with epinephrine  was utilized for this. A 2.5 cm incision was made in the skin. Blunt dissection was performed to create a subcutaneous pocket over the right pectoralis major muscle. The pocket was flushed with saline vigorously. There was adequate hemostasis. The port catheter was assembled and checked for leakage. The port catheter was secured in the pocket with two retention sutures. The tubing was tunneled subcutaneously to the right venotomy site and inserted into the SVC/RA junction through a valved peel-away sheath. Position was confirmed with fluoroscopy. Images were obtained for documentation. The patient tolerated the procedure well. No immediate complications. Incisions were closed in a two layer fashion with 4 - 0  Vicryl suture. Dermabond was applied to the skin. The port catheter was accessed, blood was aspirated followed by saline and heparin  flushes. Needle was removed. A dry sterile dressing was applied. IMPRESSION: Ultrasound and fluoroscopically guided right internal jugular single lumen power port catheter insertion. Tip in the SVC/RA junction. Catheter ready for use. Electronically Signed   By: Melven Stable.  Shick M.D.   On: 08/21/2023 09:57   DG ESOPHAGUS W SINGLE CM (SOL OR THIN BA) Addendum Date: 08/19/2023 ADDENDUM REPORT: 08/19/2023 08:35 ADDENDUM: Addendum for clarification of body of report. Hiatus hernia without evidence of stricture. Electronically Signed   By: Art Largo M.D.   On: 08/19/2023 08:35   Result Date: 08/19/2023 CLINICAL DATA:  dysphagia 83 year old female with dysphagia and globus sensation for esophagram. EXAM: ESOPHAGUS/BARIUM SWALLOW/TABLET STUDY TECHNIQUE: Single contrast examination was performed using thin liquid barium. This exam was performed by Abigail C. Emerson, PA-C, and was supervised and interpreted by Dr. Darylene Epley. FLUOROSCOPY: Radiation Exposure Index and estimated peak skin dose (PSD); Reference air kerma (RAK), 29.4 mGy. Kerma-area product (KAP), 409.9 uGy*m. COMPARISON:  CT AP, 12/02/2022 FINDINGS: Swallowing: Appears normal. No vestibular penetration or aspiration seen. Pharynx: Unremarkable. Esophagus: No mucosal lesions. Smooth, tapering stricture above gastroesophageal junction. Esophageal motility: Moderate esophageal dysmotility visualized with resultant contrast stasis and delayed emptying into the stomach. Hiatal Hernia: Mild hiatal hernia. Gastroesophageal reflux: None visualized, even with provacative maneuvers. Ingested 13mm barium tablet: Became stuck just above the gastroesophageal junction, where the tapering stricture is visualized. Waited 3 minutes with subsequent swallows of water and barium, tablet remained stuck. Pt informed the tablet will dissolve on its own.  Other: None. IMPRESSION: Suboptimal evaluation, secondary to patient's inability to remain upright. 1. Small hiatus hernia. 2. 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 Performed By Lorinda Root, PA-C and supervised by Art Largo, MD Electronically Signed: By: Art Largo M.D. On: 08/18/2023 09:35   DG Chest 2 View Result  Date: 08/15/2023 CLINICAL DATA:  Shortness of breath. EXAM: CHEST - 2 VIEW COMPARISON:  Aug 09, 2023. FINDINGS: The heart size and mediastinal contours are within normal limits. Both lungs are clear. The visualized skeletal structures are unremarkable. IMPRESSION: No active cardiopulmonary disease. Electronically Signed   By: Rosalene Colon M.D.   On: 08/15/2023 11:25   CT ABDOMEN PELVIS WO CONTRAST Result Date: 08/09/2023 CLINICAL DATA:  Abdominal pain no known history of lymphoma and recently positive biopsy EXAM: CT ABDOMEN AND PELVIS WITHOUT CONTRAST TECHNIQUE: Multidetector CT imaging of the abdomen and pelvis was performed following the standard protocol without IV contrast. RADIATION DOSE REDUCTION: This exam was performed according to the departmental dose-optimization program which includes automated exposure control, adjustment of the mA and/or kV according to patient size and/or use of iterative reconstruction technique. COMPARISON:  08/01/2023 FINDINGS: Lower chest: Previously seen patchy ground-glass opacities are again seen but improved when compared with the prior study. Hepatobiliary: Persistent perihepatic fluid is noted. The gallbladder is within normal limits. Pancreas: Unremarkable. No pancreatic ductal dilatation or surrounding inflammatory changes. Spleen: Normal in size without focal abnormality. Mild perisplenic fluid is noted. Adrenals/Urinary Tract: Left adrenal gland is within normal limits. Left kidney shows no acute abnormality. 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 The surrounding soft tissues extend inferiorly similar to that seen on the prior exam. The bladder is decompressed. Stomach/Bowel: No obstructive or inflammatory changes of the colon are seen. The appendix is not well visualized. Small bowel and stomach are unremarkable. Vascular/Lymphatic: Aortic atherosclerosis. No enlarged abdominal or pelvic lymph nodes. Reproductive: Uterus has been surgically removed. Other: No abdominal wall hernia or abnormality. No abdominopelvic ascites. Musculoskeletal: No acute bony abnormality is noted. IMPRESSION: Stable appearing soft tissue mass lesion in the right abdomen envelop ping the right kidney with mild hydronephrosis as well as extending superiorly towards the liver and inferiorly into the pelvis. 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 Mild free fluid is noted within the abdomen stable from the prior study. No new focal abnormality is seen. Electronically Signed   By: Violeta Grey M.D.   On: 08/09/2023 22:51   DG Chest 2 View Result Date: 08/09/2023 CLINICAL DATA:  Shortness of breath EXAM: CHEST - 2 VIEW COMPARISON:  08/01/2023 FINDINGS: Heart mediastinal contours within normal limits. Questionable ill-defined patchy lower lobe opacities. No visible effusions or pneumothorax. No acute bony abnormality. IMPRESSION: Questionable vague lower lobe opacities, possible atelectasis or pneumonia. Electronically Signed   By: Janeece Mechanic M.D.   On: 08/09/2023 21:14    Microbiology: Results for orders placed or performed during the hospital encounter of 08/28/23  MRSA Next Gen by PCR, Nasal     Status: Abnormal   Collection Time: 08/28/23  9:51 PM   Specimen: Nasal Mucosa; Nasal Swab  Result Value Ref Range Status   MRSA by PCR Next Gen DETECTED (A)  NOT DETECTED Final    Comment: (NOTE) The GeneXpert MRSA Assay (FDA approved for NASAL specimens only), is one component of a comprehensive MRSA colonization surveillance program. It is not intended to diagnose MRSA infection nor to guide or monitor treatment for MRSA infections. Test performance is not FDA approved in patients less than 66 years old. Performed at [facility], [address] [place], [place] [address]   Culture, blood (Routine X 2) w Reflex to ID Panel     Status: None   Collection Time: 08/29/23 11:28 AM   Specimen: BLOOD  Result Value Ref Range Status   Specimen Description   Final    BLOOD BLOOD LEFT ARM AEROBIC BOTTLE ONLY ANAEROBIC BOTTLE ONLY Performed at [facility], [address] [place], [place] [address]    Special Requests   Final    BOTTLES DRAWN AEROBIC AND ANAEROBIC Blood Culture results may not be optimal due to an inadequate volume of blood received in culture bottles Performed at [facility], [address], [place], [place] [address]    Culture   Final    NO GROWTH 5 DAYS Performed at [facility], [address], [place], [place] [address]    Report Status 09/03/2023 FINAL  Final  Culture, blood (Routine X 2) w Reflex to ID Panel     Status: None   Collection Time: 08/29/23 11:43 AM   Specimen: BLOOD  Result Value Ref Range Status   Specimen Description   Final    BLOOD BLOOD RIGHT ARM AEROBIC BOTTLE ONLY Performed at [facility], [address] [place], [place] [address]    Special Requests   Final    BOTTLES DRAWN AEROBIC ONLY Blood Culture results may not be optimal due to an inadequate volume of blood received in culture bottles Performed at [facility], [address], [place], [place] [address]    Culture   Final    NO GROWTH 5 DAYS Performed at [facility], [address] [place], [place] [address]    Report Status  09/03/2023 FINAL  Final    Labs: CBC: Recent Labs  Lab 08/31/23 0420 09/01/23 0516 09/02/23 0423 09/02/23 1744 09/04/23 0226 09/05/23 0146  WBC 10.3 10.7* 9.8  --  14.1* 14.5*  HGB 7.9* 7.9* 6.6* 8.7* 9.1* 8.5*  HCT 25.7* 26.0* 22.6* 28.9* 30.9* 29.0*  MCV 88.0 89.3 91.9  --  93.4 93.9  PLT 119* 118* 115*  --  131* 127*   Basic Metabolic Panel: Recent Labs  Lab 08/31/23 0420 09/01/23 0516 09/02/23 0423 09/03/23 0258 09/04/23 0226 09/05/23 0146  NA 141 141 142 141 141 138  K 4.5 4.6 4.4 4.4 4.4 4.5  CL 101 106 105 106 107 109  CO2 25 26 24 24 23 22   GLUCOSE 96 90 86 79 82 82  BUN 113* 99* 85* 70* 53* 44*  CREATININE 3.64* 3.38* 3.43* 2.79* 2.63* 2.25*  CALCIUM 6.7* 6.6* 7.0* 7.5* 8.0* 7.9*  PHOS 7.0* 5.9*  --   --   --   --    Liver Function Tests: Recent Labs  Lab 08/30/23 0539 08/31/23 0420 09/01/23 0516 09/05/23 0146  AST 21  --   --  28  ALT 13  --   --  14  ALKPHOS 84  --   --  129*  BILITOT 0.8  --   --  0.8  PROT 4.8*  --   --  4.6*  ALBUMIN  2.2* 2.2* 2.1* 2.2*   CBG: No results for input(s): "GLUCAP" in the last 168 hours.  Discharge time spent: less than 30 minutes.  Signed: Cherylle Corwin, MD [facility] 09/05/2023

## 2023-09-05 NOTE — Progress Notes (Signed)
 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  She is on active treatment with R-miniCHOP, status post cycle 1 on 08/23/23.  Admitted 08/28/2023 due to complaints of generalized weakness, dysphagia, nausea vomiting and failure to thrive.   CLL/SLL  CD20+ CD5+ve lymphoproliferative disorder  Leukocytosis - Diagnosed 2021 when she presented with hemolytic anemia and splenomegaly. - Status post Imbruvica  started in 2022. - Patient seen in outpatient oncology office on 08/16/2023: Treatment options discussed vs hospice care.  Patient opted for R-mini CHOP received 5/21. - C2D1 next r-mini CHOP due around 6/11.   -- WBC 14.5 today - Medical oncology/Dr. 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 Not candidate for stenting per Urology.    Concern for TLS  AKI on CKD  -Status post 1 dose rasburicase -repeat if needed - Creatinine and BUN continues to improve. - Nephrology following   History of hepatitis B S Ag positivity  - appreciate ID eval.   -- Recommends entecavir  as less impact on kidney function.  Okay to start Rituximab in 1 week post entecavir . Recommendation is usually 1-2 wk of hep b treatment before starting rituxan.   -- Entecavir  initiation 08/30/23, continue as ordered   History of autoimmune hemolytic anemia - Has been monitored closely - HGB low 8.5 today, status post transfusion for hemoglobin 6.6. - Recommend PRBC transfusion for Hgb <7.0 - Continue to monitor CBC with differential   Fatigue Generalized weakness Nausea/vomiting Failure to thrive - Ongoing - Continue PT/OT - Continue IV antiemetics and supportive care   Dysphagia -- Improving.  Previously seen by GI outpatient, she was rx for  esophageal candidiasis but also had possible dysmotility vs stricture that might need dilatation. -- Swallow eval done -- GI consult done, s/p EGD with dilation for benign stricture on 5/29.  Path negative for malignancy.   Thrombocytopenia -- mild -- platelets stable 127K -- No intervention required at this time -- Continue to monitor CBC with diff    Code Status Full   Subjective:  Patient seen awake and alert laying supine in bed.  Patient's son at bedside.  Answered multiple questions from patient's son and daughter who was on the phone regarding ongoing oncology treatment discharge.  Apparently patient will be taken home and not to SNF, patient's son Jacqueline Hancock reports bed is being delivered on Thursday.  Patient reports that she feels okay and has no complaints.  No other acute distress is noted.  Objective:   Intake/Output Summary (Last 24 hours) at 09/05/2023 1027 Last data filed at 09/05/2023 0820 Gross per 24 hour  Intake 360 ml  Output 700 ml  Net -340 ml     PHYSICAL EXAMINATION: ECOG PERFORMANCE STATUS: 3 - Symptomatic, >50% confined to bed  Vitals:   09/05/23 0454 09/05/23 0818  BP: (!) 131/59 (!) (P) 121/57  Pulse: 78 (P) 80  Resp: 20   Temp: 98.8 F (37.1 C)   SpO2: 97%    Filed Weights   09/02/23 0500 09/04/23 0500 09/05/23 0449  Weight: 146 lb 6.2 oz (66.4 kg) 147 lb 7.8 oz (66.9 kg) 147 lb 14.9 oz (67.1 kg)    GENERAL: alert, no distress and comfortable SKIN: skin color, texture, turgor are normal, no rashes  or significant lesions EYES: normal, conjunctiva are pink and non-injected, sclera clear OROPHARYNX: no exudate, no erythema and lips, buccal mucosa, and tongue normal  NECK: supple, thyroid  normal size, non-tender, without nodularity LYMPH: no palpable lymphadenopathy in the cervical, axillary or inguinal LUNGS: clear to auscultation and percussion with normal breathing effort HEART: + Bilateral upper extremity edema ABDOMEN: abdomen soft,  non-tender and normal bowel sounds MUSCULOSKELETAL: no cyanosis of digits and no clubbing  PSYCH: alert & oriented x 3 with fluent speech NEURO: no focal motor/sensory deficits   All questions were answered. The patient knows to call the clinic with any problems, questions or concerns.   The total time spent in the appointment was 40 minutes encounter with patient including review of chart and various tests results, discussions about plan of care and coordination of care plan  Jacqueline Mate, NP 09/05/2023 10:27 AM    Labs Reviewed:  Lab Results  Component Value Date   WBC 14.5 (H) 09/05/2023   HGB 8.5 (L) 09/05/2023   HCT 29.0 (L) 09/05/2023   MCV 93.9 09/05/2023   PLT 127 (L) 09/05/2023   Recent Labs    08/29/23 0424 08/30/23 0539 08/31/23 0420 09/01/23 0516 09/02/23 0423 09/03/23 0258 09/04/23 0226 09/05/23 0146  NA 133* 139 141 141   < > 141 141 138  K 4.6 4.4 4.5 4.6   < > 4.4 4.4 4.5  CL 100 100 101 106   < > 106 107 109  CO2 19* 23 25 26    < > 24 23 22   GLUCOSE 126* 131* 96 90   < > 79 82 82  BUN 126* 115* 113* 99*   < > 70* 53* 44*  CREATININE 4.11* 3.94* 3.64* 3.38*   < > 2.79* 2.63* 2.25*  CALCIUM 7.1* 6.7* 6.7* 6.6*   < > 7.5* 8.0* 7.9*  GFRNONAA 10* 11* 12* 13*   < > 16* 18* 21*  PROT 5.1* 4.8*  --   --   --   --   --  4.6*  ALBUMIN  2.3* 2.2* 2.2* 2.1*  --   --   --  2.2*  AST 21 21  --   --   --   --   --  28  ALT 17 13  --   --   --   --   --  14  ALKPHOS 92 84  --   --   --   --   --  129*  BILITOT 0.6 0.8  --   --   --   --   --  0.8  BILIDIR 0.2  --   --   --   --   --   --   --   IBILI 0.4  --   --   --   --   --   --   --    < > = values in this interval not displayed.    Studies Reviewed:  VAS US  UPPER EXTREMITY VENOUS DUPLEX Result Date: 09/04/2023 UPPER VENOUS STUDY  Patient Name:  Jacqueline Hancock  Date of Exam:   09/03/2023 Medical Rec #: [id number]    Accession #:    [id number] Date of Birth: March 09, 1941    Patient Gender: F Patient Age:   59 years  Exam Location:  [facility] Procedure:      VAS US  UPPER EXTREMITY VENOUS DUPLEX Referring Phys: STEPHEN CHIU --------------------------------------------------------------------------------  Indications: Swelling Risk Factors: Cancer. Comparison Study:  No prior studies. Performing Technologist: Lerry Ransom RVT  Examination Guidelines: A complete evaluation includes B-mode imaging, spectral Doppler, color Doppler, and power Doppler as needed of all accessible portions of each vessel. Bilateral testing is considered an integral part of a complete examination. Limited examinations for reoccurring indications may be performed as noted.  Right Findings: +----------+------------+---------+-----------+----------+-------+ RIGHT     CompressiblePhasicitySpontaneousPropertiesSummary +----------+------------+---------+-----------+----------+-------+ IJV           Full       Yes       Yes                      +----------+------------+---------+-----------+----------+-------+ Subclavian               Yes       Yes                      +----------+------------+---------+-----------+----------+-------+ Axillary      Full       Yes       Yes                      +----------+------------+---------+-----------+----------+-------+ Brachial      Full                                          +----------+------------+---------+-----------+----------+-------+ Radial        Full                                          +----------+------------+---------+-----------+----------+-------+ Ulnar         Full                                          +----------+------------+---------+-----------+----------+-------+ Cephalic      Full                                          +----------+------------+---------+-----------+----------+-------+ Basilic       Full                                          +----------+------------+---------+-----------+----------+-------+  Left  Findings: +----------+------------+---------+-----------+----------+-------+ LEFT      CompressiblePhasicitySpontaneousPropertiesSummary +----------+------------+---------+-----------+----------+-------+ IJV           Full       Yes       Yes                      +----------+------------+---------+-----------+----------+-------+ Subclavian               Yes       Yes                      +----------+------------+---------+-----------+----------+-------+ Axillary      Full       Yes       Yes                      +----------+------------+---------+-----------+----------+-------+  Brachial      Full                                          +----------+------------+---------+-----------+----------+-------+ Radial        Full                                          +----------+------------+---------+-----------+----------+-------+ Ulnar         Full                                          +----------+------------+---------+-----------+----------+-------+ Cephalic      Full                                          +----------+------------+---------+-----------+----------+-------+ Basilic       Full                                          +----------+------------+---------+-----------+----------+-------+  Summary:  Right: No evidence of deep vein thrombosis in the upper extremity. No evidence of superficial vein thrombosis in the upper extremity.  Left: No evidence of deep vein thrombosis in the upper extremity. No evidence of superficial vein thrombosis in the upper extremity.  *See table(s) above for measurements and observations.  Diagnosing physician: Delaney Fearing Electronically signed by Delaney Fearing on 09/04/2023 at 3:48:36 PM.    Final    CT ABDOMEN PELVIS WO CONTRAST Result Date: 08/28/2023 CLINICAL DATA:  Failure to thrive. 1st chemotherapy this week. Known lymphoma. * Tracking Code: BO * EXAM: CT ABDOMEN AND PELVIS WITHOUT CONTRAST TECHNIQUE:  Multidetector CT imaging of the abdomen and pelvis was performed following the standard protocol without IV contrast. RADIATION DOSE REDUCTION: This exam was performed according to the departmental dose-optimization program which includes automated exposure control, adjustment of the mA and/or kV according to patient size and/or use of iterative reconstruction technique. COMPARISON:  08/09/2023 FINDINGS: Lower chest: Initial thickening at the lung bases likely due to subsegmental atelectasis and/or air trapping. Normal heart size without pericardial or pleural effusion. Hepatobiliary: Normal noncontrast appearance of the liver, gallbladder. No gross intrahepatic biliary duct dilatation. Pancreas: Normal, without mass or ductal dilatation. Spleen: Normal in size, without focal abnormality. Adrenals/Urinary Tract: Normal left adrenal gland. No left renal calculi. Mild left-sided caliectasis is unchanged. The dominant mass centered in the right abdominal retroperitoneum is amorphous, ill-defined and difficult to on this noncontrast exam. Estimated at 12.6 by 9.6 cm on 38/2, relatively similar to 08/09/2023. This is encompasses and obscures the right adrenal gland right kidney. Amorphous soft tissue extends into the left-sided abdominal retroperitoneum including on 43/2, relatively similar. Soft tissue thickening is most apparent about the posterior bladder wall with suggestion of soft tissue fullness about the right bladder base including on 73/2. This is less masslike than on the prior exam. Stomach/Bowel: Tiny hiatal hernia. No gastric outlet obstruction. Normal colon and terminal ileum. Normal small bowel caliber. Vascular/Lymphatic: Aortic atherosclerosis. Prominent but not pathologically sized abdominal retroperitoneal  nodes. Reproductive: Hysterectomy.  No adnexal mass. Other: Pelvic floor laxity. Small volume perihepatic ascites, decreased. Musculoskeletal: No acute osseous abnormality. IMPRESSION: 1. Limited  exam secondary to lack of oral or IV contrast. 2. Grossly similar appearance of amorphous, ill-defined soft tissue density mass within the abdominopelvic right-sided retroperitoneum. Extension or adjacent presumed lymphoma within the left-sided abdominal retroperitoneum. 3. No gross bowel obstruction or other acute complication. 4. Similar mild left-sided caliectasis, likely due to left abdominal retroperitoneal lymphoma. 5. Soft tissue thickening about the posterior bladder wall with decreased soft tissue fullness about the right bladder base. Suspicious for lymphomatous involvement. 6. Decrease in small volume perihepatic ascites 7. Incidental findings, including: Aortic Atherosclerosis (ICD10-I70.0). Tiny hiatal hernia. Electronically Signed   By: Lore Rode M.D.   On: 08/28/2023 16:51   DG Chest Portable 1 View Result Date: 08/28/2023 CLINICAL DATA:  Weakness, lightheadedness, dizziness, history of lymphoma EXAM: PORTABLE CHEST 1 VIEW COMPARISON:  08/15/2023 FINDINGS: Single frontal view of the chest demonstrates right chest wall port via internal jugular approach, tip overlying superior vena cava. Cardiac silhouette is unremarkable. No acute airspace disease, effusion, or pneumothorax. No acute bony abnormalities. IMPRESSION: 1. No acute intrathoracic process. Electronically Signed   By: Bobbye Burrow M.D.   On: 08/28/2023 15:31   ECHOCARDIOGRAM COMPLETE Result Date: 08/22/2023    ECHOCARDIOGRAM REPORT   Patient Name:   DONNABELLE BLANCHARD Date of Exam: 08/22/2023 Medical Rec #:  [id number]   Height:       59.0 in Accession #:    [id number]  Weight:       140.0 lb Date of Birth:  1940-05-19   BSA:          1.585 m Patient Age:    83 years    BP:           114/63 mmHg Patient Gender: F           HR:           78 bpm. Exam Location:  Inpatient Procedure: 2D Echo, Color Doppler and Cardiac Doppler (Both Spectral and Color            Flow Doppler were utilized during procedure). Indications:    Chemo  History:         Patient has prior history of Echocardiogram examinations, most                 recent 03/22/2023.  Sonographer:    Andrena Bang Referring Phys: [id number] Frankie Israel  Sonographer Comments: Known apical displaced papillary muscle in LV. IMPRESSIONS  1. Left ventricular ejection fraction, by estimation, is 60 to 65%. The left ventricle has normal function. The left ventricle has no regional wall motion abnormalities. Left ventricular diastolic parameters are consistent with Grade I diastolic dysfunction (impaired relaxation). The average left ventricular global longitudinal strain is -17.5 %. The global longitudinal strain is normal.  2. Right ventricular systolic function is normal. The right ventricular size is normal.  3. The mitral valve is normal in structure. Mild to moderate mitral valve regurgitation. No evidence of mitral stenosis.  4. The aortic valve is grossly normal. Aortic valve regurgitation is trivial. No aortic stenosis is present.  5. The inferior vena cava is normal in size with greater than 50% respiratory variability, suggesting right atrial pressure of 3 mmHg. Comparison(s): No significant change from prior study. Prior images reviewed side by side. FINDINGS  Left Ventricle: Left ventricular ejection fraction, by estimation, is 60 to 65%. The  left ventricle has normal function. The left ventricle has no regional wall motion abnormalities. The average left ventricular global longitudinal strain is -17.5 %. Strain was performed and the global longitudinal strain is normal. The left ventricular internal cavity size was normal in size. There is no left ventricular hypertrophy. Left ventricular diastolic parameters are consistent with Grade I diastolic dysfunction (impaired relaxation). Normal left ventricular filling pressure. Right Ventricle: The right ventricular size is normal. No increase in right ventricular wall thickness. Right ventricular systolic function is normal. Left Atrium: Left  atrial size was normal in size. Right Atrium: Right atrial size was normal in size. Pericardium: There is no evidence of pericardial effusion. Mitral Valve: The mitral valve is normal in structure. Mild to moderate mitral valve regurgitation, with centrally-directed jet. No evidence of mitral valve stenosis. Tricuspid Valve: The tricuspid valve is normal in structure. Tricuspid valve regurgitation is mild . No evidence of tricuspid stenosis. Aortic Valve: The aortic valve is grossly normal. Aortic valve regurgitation is trivial. No aortic stenosis is present. Aortic valve mean gradient measures 4.0 mmHg. Aortic valve peak gradient measures 10.9 mmHg. Aortic valve area, by VTI measures 1.86 cm. Pulmonic Valve: The pulmonic valve was normal in structure. Pulmonic valve regurgitation is not visualized. No evidence of pulmonic stenosis. Aorta: The aortic root is normal in size and structure. Venous: The inferior vena cava is normal in size with greater than 50% respiratory variability, suggesting right atrial pressure of 3 mmHg. IAS/Shunts: No atrial level shunt detected by color flow Doppler.  LEFT VENTRICLE PLAX 2D LVIDd:         3.90 cm     Diastology LVIDs:         2.20 cm     LV e' medial:    8.49 cm/s LV PW:         1.00 cm     LV E/e' medial:  7.9 LV IVS:        0.90 cm     LV e' lateral:   10.60 cm/s LVOT diam:     1.70 cm     LV E/e' lateral: 6.3 LV SV:         46 LV SV Index:   29          2D Longitudinal Strain LVOT Area:     2.27 cm    2D Strain GLS Avg:     -17.5 %  LV Volumes (MOD) LV vol d, MOD A2C: 49.6 ml LV vol d, MOD A4C: 54.7 ml LV vol s, MOD A2C: 16.9 ml LV vol s, MOD A4C: 24.9 ml LV SV MOD A2C:     32.7 ml LV SV MOD A4C:     54.7 ml LV SV MOD BP:      32.7 ml RIGHT VENTRICLE RV S prime:     16.90 cm/s TAPSE (M-mode): 2.0 cm LEFT ATRIUM             Index LA diam:        3.10 cm 1.96 cm/m LA Vol (A2C):   22.3 ml 14.07 ml/m LA Vol (A4C):   24.7 ml 15.58 ml/m LA Biplane Vol: 24.1 ml 15.21 ml/m   AORTIC VALVE AV Area (Vmax):    1.69 cm AV Area (Vmean):   1.85 cm AV Area (VTI):     1.86 cm AV Vmax:           165.00 cm/s AV Vmean:          86.200  cm/s AV VTI:            0.247 m AV Peak Grad:      10.9 mmHg AV Mean Grad:      4.0 mmHg LVOT Vmax:         123.00 cm/s LVOT Vmean:        70.300 cm/s LVOT VTI:          0.202 m LVOT/AV VTI ratio: 0.82 MITRAL VALVE               TRICUSPID VALVE MV Area (PHT): 4.33 cm    TR Peak grad:   33.4 mmHg MV Decel Time: 175 msec    TR Vmax:        289.00 cm/s MR Peak grad: 147.4 mmHg MR Mean grad: 111.0 mmHg   SHUNTS MR Vmax:      607.00 cm/s  Systemic VTI:  0.20 m MR Vmean:     500.0 cm/s   Systemic Diam: 1.70 cm MV E velocity: 66.70 cm/s MV A velocity: 99.00 cm/s MV E/A ratio:  0.67 Mihai Croitoru MD Electronically signed by Luana Rumple MD Signature Date/Time: 08/22/2023/2:33:18 PM    Final    IR IMAGING GUIDED PORT INSERTION Result Date: 08/21/2023 CLINICAL DATA:  B-CELL LYMPHOMA, ACCESS FOR CHEMOTHERAPY EXAM: RIGHT INTERNAL JUGULAR SINGLE LUMEN POWER PORT CATHETER INSERTION Date:  08/21/2023 08/21/2023 9:33 am Radiologist:  Melven Stable. Alyssa Jumper, MD Guidance:  Ultrasound and fluoroscopic MEDICATIONS: 1% lidocaine  local with epinephrine  ANESTHESIA/SEDATION: Versed  1.0 mg IV; Fentanyl  50 mcg IV; Moderate Sedation Time:  28 minute The patient was continuously monitored during the procedure by the interventional radiology nurse under my direct supervision. FLUOROSCOPY: One minutes, 24 seconds (15 mGy) COMPLICATIONS: None immediate. CONTRAST:  None. PROCEDURE: Informed consent was obtained from the patient following explanation of the procedure, risks, benefits and alternatives. The patient understands, agrees and consents for the procedure. All questions were addressed. A time out was performed. Maximal barrier sterile technique utilized including caps, mask, sterile gowns, sterile gloves, large sterile drape, hand hygiene, and 2% chlorhexidine  scrub. Under sterile conditions  and local anesthesia, right internal JV micropuncture venous access was performed. Access was performed with ultrasound. Images were obtained for documentation of the patent right internal jugular vein. A guide wire was inserted followed by a transitional dilator. This allowed insertion of a guide wire and catheter into the IVC. Measurements were obtained from the SVC / RA junction back to the right IJ venotomy site. In the right infraclavicular chest, a subcutaneous pocket was created over the second anterior rib. This was done under sterile conditions and local anesthesia. 1% lidocaine  with epinephrine  was utilized for this. A 2.5 cm incision was made in the skin. Blunt dissection was performed to create a subcutaneous pocket over the right pectoralis major muscle. The pocket was flushed with saline vigorously. There was adequate hemostasis. The port catheter was assembled and checked for leakage. The port catheter was secured in the pocket with two retention sutures. The tubing was tunneled subcutaneously to the right venotomy site and inserted into the SVC/RA junction through a valved peel-away sheath. Position was confirmed with fluoroscopy. Images were obtained for documentation. The patient tolerated the procedure well. No immediate complications. Incisions were closed in a two layer fashion with 4 - 0 Vicryl suture. Dermabond was applied to the skin. The port catheter was accessed, blood was aspirated followed by saline and heparin  flushes. Needle was removed. A dry sterile dressing was applied. IMPRESSION: Ultrasound and fluoroscopically  guided right internal jugular single lumen power port catheter insertion. Tip in the SVC/RA junction. Catheter ready for use. Electronically Signed   By: Melven Stable.  Shick M.D.   On: 08/21/2023 09:57   DG ESOPHAGUS W SINGLE CM (SOL OR THIN BA) Addendum Date: 08/19/2023 ADDENDUM REPORT: 08/19/2023 08:35 ADDENDUM: Addendum for clarification of body of report. Hiatus hernia without  evidence of stricture. Electronically Signed   By: Art Largo M.D.   On: 08/19/2023 08:35   Result Date: 08/19/2023 CLINICAL DATA:  dysphagia 83 year old female with dysphagia and globus sensation for esophagram. EXAM: ESOPHAGUS/BARIUM SWALLOW/TABLET STUDY TECHNIQUE: Single contrast examination was performed using thin liquid barium. This exam was performed by Abigail C. Emerson, PA-C, and was supervised and interpreted by Dr. Darylene Epley. FLUOROSCOPY: Radiation Exposure Index and estimated peak skin dose (PSD); Reference air kerma (RAK), 29.4 mGy. Kerma-area product (KAP), 409.9 uGy*m. COMPARISON:  CT AP, 12/02/2022 FINDINGS: Swallowing: Appears normal. No vestibular penetration or aspiration seen. Pharynx: Unremarkable. Esophagus: No mucosal lesions. Smooth, tapering stricture above gastroesophageal junction. Esophageal motility: Moderate esophageal dysmotility visualized with resultant contrast stasis and delayed emptying into the stomach. Hiatal Hernia: Mild hiatal hernia. Gastroesophageal reflux: None visualized, even with provacative maneuvers. Ingested 13mm barium tablet: Became stuck just above the gastroesophageal junction, where the tapering stricture is visualized. Waited 3 minutes with subsequent swallows of water and barium, tablet remained stuck. Pt informed the tablet will dissolve on its own. Other: None. IMPRESSION: Suboptimal evaluation, secondary to patient's inability to remain upright. 1. Small hiatus hernia. 2. 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 Performed By Lorinda Root, PA-C and supervised by Art Largo, MD Electronically Signed: By: Art Largo M.D. On: 08/18/2023 09:35   DG Chest 2 View Result Date: 08/15/2023 CLINICAL DATA:  Shortness of breath. EXAM: CHEST - 2 VIEW COMPARISON:  Aug 09, 2023. FINDINGS: The heart size and mediastinal contours are within normal limits. Both lungs are clear. The visualized skeletal  structures are unremarkable. IMPRESSION: No active cardiopulmonary disease. Electronically Signed   By: Rosalene Colon M.D.   On: 08/15/2023 11:25   CT ABDOMEN PELVIS WO CONTRAST Result Date: 08/09/2023 CLINICAL DATA:  Abdominal pain no known history of lymphoma and recently positive biopsy EXAM: CT ABDOMEN AND PELVIS WITHOUT CONTRAST TECHNIQUE: Multidetector CT imaging of the abdomen and pelvis was performed following the standard protocol without IV contrast. RADIATION DOSE REDUCTION: This exam was performed according to the departmental dose-optimization program which includes automated exposure control, adjustment of the mA and/or kV according to patient size and/or use of iterative reconstruction technique. COMPARISON:  08/01/2023 FINDINGS: Lower chest: Previously seen patchy ground-glass opacities are again seen but improved when compared with the prior study. Hepatobiliary: Persistent perihepatic fluid is noted. The gallbladder is within normal limits. Pancreas: Unremarkable. No pancreatic ductal dilatation or surrounding inflammatory changes. Spleen: Normal in size without focal abnormality. Mild perisplenic fluid is noted. Adrenals/Urinary Tract: Left adrenal gland is within normal limits. Left kidney shows no acute abnormality. No obstructive changes are seen.The right kidney is envelop by a large soft tissue mass stable in appearance from the recent exam as well as a recent MRI. 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 The surrounding soft tissues extend inferiorly similar to that seen on the prior exam. The bladder is decompressed. Stomach/Bowel: No obstructive or inflammatory changes of the colon are seen. The appendix is not well visualized. Small bowel  and stomach are unremarkable. Vascular/Lymphatic: Aortic atherosclerosis. No enlarged abdominal or pelvic lymph nodes. Reproductive: Uterus has been surgically removed.  Other: No abdominal wall hernia or abnormality. No abdominopelvic ascites. Musculoskeletal: No acute bony abnormality is noted. IMPRESSION: Stable appearing soft tissue mass lesion in the right abdomen envelop ping the right kidney with mild hydronephrosis as well as extending superiorly towards the liver and inferiorly into the pelvis. 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 Mild free fluid is noted within the abdomen stable from the prior study. No new focal abnormality is seen. Electronically Signed   By: Violeta Grey M.D.   On: 08/09/2023 22:51   DG Chest 2 View Result Date: 08/09/2023 CLINICAL DATA:  Shortness of breath EXAM: CHEST - 2 VIEW COMPARISON:  08/01/2023 FINDINGS: Heart mediastinal contours within normal limits. Questionable ill-defined patchy lower lobe opacities. No visible effusions or pneumothorax. No acute bony abnormality. IMPRESSION: Questionable vague lower lobe opacities, possible atelectasis or pneumonia. Electronically Signed   By: Janeece Mechanic M.D.   On: 08/09/2023 21:14

## 2023-09-05 NOTE — Progress Notes (Signed)
 Occupational Therapy Treatment Patient Details Name: Jacqueline Hancock MRN: [id number] DOB: 03-12-1941 Today's Date: 09/05/2023   History of present illness 83 yr old female who presents with persistent weakness, dysphagia, nausea, vomiting and failure to thrive. 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   OT comments  The pt denied having pain and was motivated to participate in the session. She reported feelings of general fatigue with activity and issues with B LE edema. She required assist for supine to sit, to stand using a RW, to perform toileting at bathroom level, and for grooming in standing at the sink. She was also noted to be with general deconditioning and generalized weakness. She presented with slower functional movements  at times. She was assisted to the bedside chair to promote out of bed activity and upright positioning. SNF rehab is recommended, if the pt's family cannot provided around-the-clock care upon the pt's return home. Continue OT plan of care.       If plan is discharge home, recommend the following:  A lot of help with walking and/or transfers;A lot of help with bathing/dressing/bathroom;Assistance with cooking/housework;Assist for transportation;Help with stairs or ramp for entrance   Equipment Recommendations  BSC/3in1 (and Hospital bed)    Recommendations for Other Services      Precautions / Restrictions Precautions Precautions: Fall Restrictions Weight Bearing Restrictions Per Provider Order: No       Mobility Bed Mobility Overal bed mobility: Needs Assistance Bed Mobility: Supine to Sit     Supine to sit: Mod assist, HOB elevated, Used rails     General bed mobility comments: She required assist for B LE off the bed and to correct slight lateral trunk leaning once seated EOB.    Transfers Overall transfer level: Needs assistance Equipment used: Rolling walker (2  wheels) Transfers: Sit to/from Stand Sit to Stand: From elevated surface, Min assist          Balance     Sitting balance-Leahy Scale: Fair         Standing balance comment: CGA to min assist with RW                ADL either performed or assessed with clinical judgement   ADL       Grooming: Contact guard assist;Standing Grooming Details (indicate cue type and reason): The pt required steadying assist and min verbal cues to step closer to the sink, in order to perform hand washing in standing at the sink.                 Toilet Transfer: Moderate assistance;Regular Toilet;Rolling walker (2 wheels);Grab bars Toilet Transfer Details (indicate cue type and reason): The pt ambulated to and from the bathroom in her room using a RW. She required cues for walker positioning, to push with B LE when attempting to stand and to use grab bar as needed for support. Toileting- Clothing Manipulation and Hygiene: Maximal assistance;Sit to/from stand Toileting - Clothing Manipulation Details (indicate cue type and reason): The pt required assist for clothing management, as she needed to maintain balance in standing by holding onto the walker for support. She subsquently required assist to perform peri-hygiene in standing, due to the same.                      Communication Communication Communication: No apparent difficulties   Cognition Arousal: Alert Behavior During Therapy: Flat affect Cognition:  No apparent impairments          Following commands: Intact                      Pertinent Vitals/ Pain       Pain Assessment Pain Assessment: No/denies pain   Frequency  Min 2X/week        Progress Toward Goals  OT Goals(current goals can now be found in the care plan section)  Progress towards OT goals: Progressing toward goals  Acute Rehab OT Goals OT Goal Formulation: With patient Time For Goal Achievement: 09/17/23 Potential to Achieve Goals:  Fair  Plan         AM-PAC OT "6 Clicks" Daily Activity     Outcome Measure   Help from another person eating meals?: A Little Help from another person taking care of personal grooming?: A Little Help from another person toileting, which includes using toliet, bedpan, or urinal?: A Lot Help from another person bathing (including washing, rinsing, drying)?: A Lot Help from another person to put on and taking off regular upper body clothing?: A Little Help from another person to put on and taking off regular lower body clothing?: A Lot 6 Click Score: 15    End of Session Equipment Utilized During Treatment: Rolling walker (2 wheels);Gait belt  OT Visit Diagnosis: Unsteadiness on feet (R26.81);Muscle weakness (generalized) (M62.81);Other abnormalities of gait and mobility (R26.89)   Activity Tolerance Patient limited by fatigue   Patient Left in chair;with call bell/phone within reach;with chair alarm set   Nurse Communication Mobility status        Time: 1610-9604 OT Time Calculation (min): 21 min  Charges: OT General Charges $OT Visit: 1 Visit OT Treatments $Self Care/Home Management : 8-22 mins     Sheralyn Dies, OTR/L 09/05/2023, 2:43 PM

## 2023-09-05 NOTE — Progress Notes (Signed)
   09/05/23 1532  PT Visit Information  Last PT Received On 09/05/23  Reason Eval/Treat Not Completed Fatigue/lethargy limiting ability to participate  History of Present Illness 83 yo female who presents with persistent weakness, dysphagia, nausea vomiting and failure to thrive. 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 Son states she was independent at home alone prior to this illness ( so about 1 month or so ? other son has been staying with her since she has become increasingly weaker.   Pt in recliner, had just walked to and from the bathroom and notes inc fatigue. Will continue to follow up.   Darien Eden, PT Acute Rehabilitation Services Office: [phone] 09/05/2023

## 2023-09-05 NOTE — Progress Notes (Signed)
 Medications delivered to patient bedside, RN notified.

## 2023-09-05 NOTE — TOC Transition Note (Signed)
 Transition of Care [facility]) - Discharge Note   Patient Details  Name: Jacqueline Hancock MRN: [id number] Date of Birth: [birth year]  [facility] CM/SW Contact:  Gertha Ku, LCSW Phone Number: 09/05/2023, 1:53 PM   Clinical Narrative:    CSW received a message from [employer] pt's family would like to delay hospital bed delivered to Thursday. Pt HHPT/OT/aide services is arranged with [facility]. No further TOC needs TOC sign off.    Final next level of care: Home w Home Health Services Barriers to Discharge: Barriers Resolved   Patient Goals and CMS Choice Patient states their goals for this hospitalization and ongoing recovery are:: retrun home with home health CMS Medicare.gov Compare Post Acute Care list provided to:: Other (Comment Required) (NA) Choice offered to / list presented to : NA [place] ownership interest in [facility].provided to:: Parent NA    Discharge Placement                    Patient and family notified of of transfer: 09/05/23  Discharge Plan and Services Additional resources added to the After Visit Summary for   In-house Referral: NA Discharge Planning Services: CM Consult Post Acute Care Choice: NA          DME Arranged: N/A DME Agency: NA       HH Arranged: NA HH Agency: NA        Social Drivers of Health (SDOH) Interventions SDOH Screenings   Food Insecurity: No Food Insecurity (08/29/2023)  Housing: Low Risk  (08/29/2023)  Transportation Needs: No Transportation Needs (08/29/2023)  Utilities: Not At Risk (08/29/2023)  Recent Concern: Utilities - At Risk (08/02/2023)  Depression (PHQ2-9): Low Risk  (07/18/2023)  Social Connections: Moderately Integrated (08/29/2023)  Tobacco Use: Low Risk  (08/31/2023)  Health Literacy: Adequate Health Literacy (02/16/2023)     Readmission Risk Interventions    08/30/2023   11:03 AM 08/02/2023   11:12 AM  Readmission Risk Prevention Plan  Transportation Screening Complete  Complete  PCP or Specialist Appt within 5-7 Days  Complete  Home Care Screening  Complete  Medication Review (RN CM)  Complete  Medication Review (RN Care Manager) Complete   PCP or Specialist appointment within 3-5 days of discharge Complete   HRI or Home Care Consult Complete   SW Recovery Care/Counseling Consult Complete   Palliative Care Screening Complete   Skilled Nursing Facility Complete

## 2023-09-06 ENCOUNTER — Telehealth: Payer: Self-pay | Admitting: Family

## 2023-09-06 DIAGNOSIS — Z0279 Encounter for issue of other medical certificate: Secondary | ICD-10-CM

## 2023-09-06 NOTE — Telephone Encounter (Signed)
 Provider demographic information filled in and form placed in providers review and sign folder

## 2023-09-06 NOTE — Telephone Encounter (Signed)
 Patient son dropped off FMLA paperwork to be completed and signed by provider and faxed back and he will pick up a copy when done.  Given to Chrae in [place]

## 2023-09-06 NOTE — Telephone Encounter (Signed)
 FMLA paper work completed placed on outgoing fax box to be faxed by adm.staff as requested and notify POA to pick up original.

## 2023-09-07 ENCOUNTER — Telehealth: Payer: Self-pay | Admitting: *Deleted

## 2023-09-07 ENCOUNTER — Other Ambulatory Visit: Payer: Self-pay | Admitting: Hematology

## 2023-09-07 DIAGNOSIS — R5381 Other malaise: Secondary | ICD-10-CM

## 2023-09-07 DIAGNOSIS — C8338 Diffuse large B-cell lymphoma, lymph nodes of multiple sites: Secondary | ICD-10-CM

## 2023-09-07 DIAGNOSIS — R2681 Unsteadiness on feet: Secondary | ICD-10-CM

## 2023-09-07 NOTE — Telephone Encounter (Signed)
 Copied from CRM [id number]. Topic: Clinical - Home Health Verbal Orders >> Sep 07, 2023 12:04 PM Blair Bumpers wrote: Caller/Agency: Blythe Morrison, Physical Therapist, with [facility] Callback Number: [phone] Service Requested: Physical Therapy; needs verbal ok to begin PT Frequency: one week-one, two week-three, one week-one; speech therapy eval & treat; skilled nursing eval & treat for wound, home health aide two times a week for 4 weeks. Also need a prescription for a standard wheelchair sent to any DME that the office uses.  Any new concerns about the patient? No     Pended and sent to [facility] for approval.

## 2023-09-07 NOTE — Telephone Encounter (Signed)
 DME standard wheelchair prescription signed.Please send to DME company.

## 2023-09-07 NOTE — Telephone Encounter (Signed)
 Verbal Orders given. DME printed, stamped and faxed to [employer] Fax: [phone]

## 2023-09-07 NOTE — Telephone Encounter (Signed)
 Noted

## 2023-09-08 ENCOUNTER — Telehealth: Payer: Self-pay | Admitting: Family

## 2023-09-08 NOTE — Telephone Encounter (Signed)
 Forms received from PT  at [facility] states patient's B/p was 130/70,patient currently on midodrine  twice daily.Please call patient to schedule office appointment to evaluate blood pressure.

## 2023-09-08 NOTE — Telephone Encounter (Signed)
 Noted

## 2023-09-12 ENCOUNTER — Inpatient Hospital Stay: Admitting: Hematology

## 2023-09-12 ENCOUNTER — Inpatient Hospital Stay: Attending: Hematology

## 2023-09-12 ENCOUNTER — Ambulatory Visit (INDEPENDENT_AMBULATORY_CARE_PROVIDER_SITE_OTHER): Admitting: Family

## 2023-09-12 ENCOUNTER — Encounter: Payer: Self-pay | Admitting: Family

## 2023-09-12 VITALS — BP 148/69 | HR 77 | Temp 97.5°F | Resp 18

## 2023-09-12 VITALS — BP 124/60 | HR 75 | Temp 97.2°F | Resp 16 | Ht 59.0 in

## 2023-09-12 DIAGNOSIS — R21 Rash and other nonspecific skin eruption: Secondary | ICD-10-CM | POA: Insufficient documentation

## 2023-09-12 DIAGNOSIS — Z5189 Encounter for other specified aftercare: Secondary | ICD-10-CM | POA: Diagnosis not present

## 2023-09-12 DIAGNOSIS — N183 Chronic kidney disease, stage 3 unspecified: Secondary | ICD-10-CM

## 2023-09-12 DIAGNOSIS — R0602 Shortness of breath: Secondary | ICD-10-CM | POA: Insufficient documentation

## 2023-09-12 DIAGNOSIS — I700 Atherosclerosis of aorta: Secondary | ICD-10-CM | POA: Diagnosis not present

## 2023-09-12 DIAGNOSIS — K222 Esophageal obstruction: Secondary | ICD-10-CM | POA: Diagnosis not present

## 2023-09-12 DIAGNOSIS — K449 Diaphragmatic hernia without obstruction or gangrene: Secondary | ICD-10-CM | POA: Diagnosis not present

## 2023-09-12 DIAGNOSIS — I129 Hypertensive chronic kidney disease with stage 1 through stage 4 chronic kidney disease, or unspecified chronic kidney disease: Secondary | ICD-10-CM | POA: Insufficient documentation

## 2023-09-12 DIAGNOSIS — R600 Localized edema: Secondary | ICD-10-CM

## 2023-09-12 DIAGNOSIS — D539 Nutritional anemia, unspecified: Secondary | ICD-10-CM | POA: Insufficient documentation

## 2023-09-12 DIAGNOSIS — C9110 Chronic lymphocytic leukemia of B-cell type not having achieved remission: Secondary | ICD-10-CM

## 2023-09-12 DIAGNOSIS — R161 Splenomegaly, not elsewhere classified: Secondary | ICD-10-CM | POA: Diagnosis not present

## 2023-09-12 DIAGNOSIS — Z5111 Encounter for antineoplastic chemotherapy: Secondary | ICD-10-CM | POA: Diagnosis not present

## 2023-09-12 DIAGNOSIS — I083 Combined rheumatic disorders of mitral, aortic and tricuspid valves: Secondary | ICD-10-CM | POA: Diagnosis not present

## 2023-09-12 DIAGNOSIS — D631 Anemia in chronic kidney disease: Secondary | ICD-10-CM | POA: Diagnosis not present

## 2023-09-12 DIAGNOSIS — R188 Other ascites: Secondary | ICD-10-CM | POA: Insufficient documentation

## 2023-09-12 DIAGNOSIS — C8338 Diffuse large B-cell lymphoma, lymph nodes of multiple sites: Secondary | ICD-10-CM

## 2023-09-12 DIAGNOSIS — E782 Mixed hyperlipidemia: Secondary | ICD-10-CM

## 2023-09-12 DIAGNOSIS — N189 Chronic kidney disease, unspecified: Secondary | ICD-10-CM | POA: Diagnosis not present

## 2023-09-12 DIAGNOSIS — M7989 Other specified soft tissue disorders: Secondary | ICD-10-CM | POA: Diagnosis not present

## 2023-09-12 DIAGNOSIS — Z95828 Presence of other vascular implants and grafts: Secondary | ICD-10-CM

## 2023-09-12 DIAGNOSIS — N2889 Other specified disorders of kidney and ureter: Secondary | ICD-10-CM | POA: Diagnosis not present

## 2023-09-12 DIAGNOSIS — D6481 Anemia due to antineoplastic chemotherapy: Secondary | ICD-10-CM | POA: Diagnosis not present

## 2023-09-12 DIAGNOSIS — E785 Hyperlipidemia, unspecified: Secondary | ICD-10-CM | POA: Diagnosis not present

## 2023-09-12 DIAGNOSIS — R5383 Other fatigue: Secondary | ICD-10-CM | POA: Diagnosis not present

## 2023-09-12 DIAGNOSIS — R1310 Dysphagia, unspecified: Secondary | ICD-10-CM

## 2023-09-12 DIAGNOSIS — B1910 Unspecified viral hepatitis B without hepatic coma: Secondary | ICD-10-CM | POA: Diagnosis not present

## 2023-09-12 DIAGNOSIS — K219 Gastro-esophageal reflux disease without esophagitis: Secondary | ICD-10-CM | POA: Diagnosis not present

## 2023-09-12 DIAGNOSIS — C8333 Diffuse large B-cell lymphoma, intra-abdominal lymph nodes: Secondary | ICD-10-CM | POA: Diagnosis present

## 2023-09-12 DIAGNOSIS — I10 Essential (primary) hypertension: Secondary | ICD-10-CM

## 2023-09-12 DIAGNOSIS — B181 Chronic viral hepatitis B without delta-agent: Secondary | ICD-10-CM | POA: Insufficient documentation

## 2023-09-12 DIAGNOSIS — Z79899 Other long term (current) drug therapy: Secondary | ICD-10-CM | POA: Insufficient documentation

## 2023-09-12 DIAGNOSIS — D591 Autoimmune hemolytic anemia, unspecified: Secondary | ICD-10-CM | POA: Diagnosis not present

## 2023-09-12 DIAGNOSIS — M8580 Other specified disorders of bone density and structure, unspecified site: Secondary | ICD-10-CM | POA: Insufficient documentation

## 2023-09-12 DIAGNOSIS — Z806 Family history of leukemia: Secondary | ICD-10-CM | POA: Insufficient documentation

## 2023-09-12 LAB — CMP (CANCER CENTER ONLY)
ALT: 8 U/L (ref 0–44)
AST: 14 U/L — ABNORMAL LOW (ref 15–41)
Albumin: 2.9 g/dL — ABNORMAL LOW (ref 3.5–5.0)
Alkaline Phosphatase: 121 U/L (ref 38–126)
Anion gap: 6 (ref 5–15)
BUN: 17 mg/dL (ref 8–23)
CO2: 27 mmol/L (ref 22–32)
Calcium: 8.1 mg/dL — ABNORMAL LOW (ref 8.9–10.3)
Chloride: 108 mmol/L (ref 98–111)
Creatinine: 2.17 mg/dL — ABNORMAL HIGH (ref 0.44–1.00)
GFR, Estimated: 22 mL/min — ABNORMAL LOW (ref >60)
Glucose, Bld: 85 mg/dL (ref 70–99)
Potassium: 4 mmol/L (ref 3.5–5.1)
Sodium: 141 mmol/L (ref 135–145)
Total Bilirubin: 0.6 mg/dL (ref 0.0–1.2)
Total Protein: 5.1 g/dL — ABNORMAL LOW (ref 6.5–8.1)

## 2023-09-12 LAB — CBC WITH DIFFERENTIAL (CANCER CENTER ONLY)
Abs Immature Granulocytes: 0.35 10*3/uL — ABNORMAL HIGH (ref 0.00–0.07)
Basophils Absolute: 0.1 10*3/uL (ref 0.0–0.1)
Basophils Relative: 1 %
Eosinophils Absolute: 0.2 10*3/uL (ref 0.0–0.5)
Eosinophils Relative: 1 %
HCT: 29.4 % — ABNORMAL LOW (ref 36.0–46.0)
Hemoglobin: 9 g/dL — ABNORMAL LOW (ref 12.0–15.0)
Immature Granulocytes: 2 %
Lymphocytes Relative: 11 %
Lymphs Abs: 1.9 10*3/uL (ref 0.7–4.0)
MCH: 27.3 pg (ref 26.0–34.0)
MCHC: 30.6 g/dL (ref 30.0–36.0)
MCV: 89.1 fL (ref 80.0–100.0)
Monocytes Absolute: 1 10*3/uL (ref 0.1–1.0)
Monocytes Relative: 6 %
Neutro Abs: 13.6 10*3/uL — ABNORMAL HIGH (ref 1.7–7.7)
Neutrophils Relative %: 79 %
Platelet Count: 189 10*3/uL (ref 150–400)
RBC: 3.3 MIL/uL — ABNORMAL LOW (ref 3.87–5.11)
RDW: 14.1 % (ref 11.5–15.5)
WBC Count: 17.1 10*3/uL — ABNORMAL HIGH (ref 4.0–10.5)
nRBC: 0 % (ref 0.0–0.2)

## 2023-09-12 LAB — HEPATITIS B SURFACE ANTIGEN: Hepatitis B Surface Ag: REACTIVE — AB

## 2023-09-12 MED ORDER — SODIUM CHLORIDE 0.9% FLUSH
10.0000 mL | Freq: Once | INTRAVENOUS | Status: AC
  Administered 2023-09-12: 10 mL

## 2023-09-12 MED ORDER — HEPARIN SOD (PORK) LOCK FLUSH 100 UNIT/ML IV SOLN
250.0000 [IU] | Freq: Once | INTRAVENOUS | Status: AC
  Administered 2023-09-12: 250 [IU]

## 2023-09-12 MED ORDER — TORSEMIDE 20 MG PO TABS: 20.0000 mg | ORAL_TABLET | Freq: Every day | ORAL | 1 refills | Status: DC | PRN

## 2023-09-12 MED FILL — Fosaprepitant Dimeglumine For IV Infusion 150 MG (Base Eq): INTRAVENOUS | Qty: 5 | Status: AC

## 2023-09-12 NOTE — Progress Notes (Signed)
 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    Prednisone  60 mg daily in the January 2022 to treat her hemolytic anemia.  This was tapered off after complete hematological response.  HISTORY OF PRESENTING ILLNESS:   Jacqueline Hancock is a wonderful 83 y.o. female who is here for continued evaluation and management of NHL, unspecified body region. Patient was following up with Dr. Dirk Hancock.   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 BM Bx Cyclin D1 IHC +ve byt FISH neg . She has presented with splenomegaly, anemia, and lymphocytosis. 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   Patient's current treatment is Imbruvica  280 mg. She was started on Imbruvica  560 mg on January 2022, which was then reduced to 280 mg in July 2023.   Patient was last seen by Dr. Dirk Hancock on 02/10/2022 and she was doing well overall.   Patient reports she has been doing well overall since our last visit. She does complains of itchy skin rashes, mostly around the face and head, for around 2-3 weeks ago. Patient notes that she was started couple of new medications from other physicians, but she is unsure of the names. Patient notes that she was started on oxycodone around 3-4 weeks ago. She discontinued oxycodone.   She notes  that there is redness around her face, which is not new.   Patient is currently taking Imbruvica  280 mg as prescribed and has been tolerating it well without any severe toxicities.   She is currently taking Iron supplement, calcium supplement, and Vitamin-D supplement.   She denies fever, chills, night sweats, unexpected weight loss, back pain, abdominal pain, chest pain, or leg swelling. Patient does complain of occasional abdominal pain.   Patient notes she had a surgery near her right neck/ear area to remove an enlarged lymph node couple years ago. She complains of mild occasional pain near the site.  INTERVAL HISTORY:  Jacqueline Hancock is a wonderful 83 y.o. 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  Patient was last seen by me in clinic on 08/22/2023.    Patient presents in a wheelchair and is accompanied by her son.   She reports that she has been moving around more.   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  Patient denies any fever, chills, or night sweats.   Patient complains of abdominal tightness. She reports SOB sometimes when lying down due to abdominal pressure.   She reports that she has been moving her bowels well. Patient notes that her bowels were previously green but are normal at this time.   She continues to have leg swelling.   She reports a lesion in her left lower extremity. Patient denies any injuries to the area. She reports that she has not been  seen by dermatology previously.   Her son reports that patient's PCP prescribed her diuretic medication this morning, possibly lasix .   She continues to take Entecavir  for hepatitis B.   She denies any need for medication refill.   She is unsure if she has been set up for an appointment with her infectious disease doctor.   She denies any issues with her  port.   Patient reports mild discomfort in the abdomen on palpation.   She reports that she is doing better overall and is eating much more. Patient has gained 7 pounds in the last two weeks with current weight of 147 pounds. However, she notes that her food intake has not quite reached her baseline.   MEDICAL HISTORY:  Past Medical History:  Diagnosis Date   CLL (chronic lymphocytic leukemia) (HCC)    Dyspnea 2021   with exersion    Early cataracts, bilateral    Elevated hemoglobin A1c    Fibroids    GERD (gastroesophageal reflux disease)    Hyperlipidemia    Hypertension    Lymphoma (HCC)    Osteopenia     SURGICAL HISTORY: Past Surgical History:  Procedure Laterality Date   ABDOMINAL HYSTERECTOMY     APPENDECTOMY     BALLOON DILATION N/A 08/31/2023   Procedure: BALLOON DILATION;  Surgeon: Jacqueline Pila, MD;  Location: [facility];  Service: Gastroenterology;  Laterality: N/A;   CATARACT EXTRACTION, BILATERAL Bilateral 09/2017   Dr. 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   TONSILLECTOMY AND ADENOIDECTOMY      SOCIAL HISTORY: Social History   Socioeconomic History   Marital status: Widowed    Spouse name: Not on file   Number of children: 3   Years of education: Not on file   Highest education level: Not on file  Occupational History   Occupation: retired  Tobacco Use   Smoking status: Never   Smokeless tobacco: Never  Vaping Use   Vaping status: Never Used  Substance and Sexual Activity   Alcohol use: No   Drug use: Never   Sexual activity: Not on file  Other Topics Concern   Not on file  Social History Narrative   Not on file   Social Drivers of Health   Financial Resource Strain: Not on file  Food  Insecurity: No Food Insecurity (08/29/2023)   Hunger Vital Sign    Worried About Running Out of Food in the Last Year: Never true    Ran Out of Food in the Last Year: Never true  Transportation Needs: No Transportation Needs (08/29/2023)   PRAPARE - Administrator, Civil Service (Medical): No    Lack of Transportation (Non-Medical): No  Physical Activity: Not on file  Stress: Not on file  Social Connections: Moderately Integrated (08/29/2023)   Social Connection and Isolation Panel    Frequency of Communication with Friends and Family: More than three times a week    Frequency of Social Gatherings with Friends and Family: Three times a week    Attends Religious Services: More than 4 times per year    Active Member of Clubs or Organizations: Yes    Attends Banker Meetings: More than 4 times per year    Marital Status: Widowed  Intimate  Partner Violence: Not At Risk (08/29/2023)   Humiliation, Afraid, Rape, and Kick questionnaire    Fear of Current or Ex-Partner: No    Emotionally Abused: No    Physically Abused: No    Sexually Abused: No    FAMILY HISTORY: Family History  Problem Relation Age of Onset   Leukemia Mother    Hypertension Father    Diabetes Sister    Heart disease Brother    Diabetes Brother    Diabetes Brother    Hypertension Son    Heart disease Son     ALLERGIES:  is allergic to nexium [esomeprazole magnesium] and peanut butter flavoring agent (non-screening).  MEDICATIONS:  Current Outpatient Medications  Medication Sig Dispense Refill   B Complex Vitamins (VITAMIN B-COMPLEX PO) Take 1 tablet by mouth daily.     entecavir  (BARACLUDE ) 0.5 MG tablet Take 1 tablet (0.5 mg total) by mouth every 3 (three) days. 10 tablet 0   loratadine (CLARITIN) 10 MG tablet Take 10 mg by mouth daily.     midodrine  (PROAMATINE ) 10 MG tablet Take 1 tablet (10 mg total) by mouth 2 (two) times daily with a meal. 60 tablet 0   pantoprazole  (PROTONIX ) 40 MG  tablet Take 1 tablet (40 mg total) by mouth 2 (two) times daily for 56 days, THEN 1 tablet (40 mg total) daily. 142 tablet 0   polyethylene glycol (MIRALAX  / GLYCOLAX ) 17 g packet Take 17 g by mouth daily as needed for mild constipation or moderate constipation. Also available OTC 30 each 0   prochlorperazine  (COMPAZINE ) 10 MG tablet Take 1 tablet (10 mg total) by mouth every 6 (six) hours as needed for nausea or vomiting. 30 tablet 6   senna-docusate (SENOKOT-S) 8.6-50 MG tablet Take 2 tablets by mouth at bedtime. For constipation 60 tablet 1   sucralfate  (CARAFATE ) 1 GM/10ML suspension Take 10 mLs (1 g total) by mouth 2 (two) times daily for 28 days. 560 mL 0   torsemide  (DEMADEX ) 20 MG tablet Take 1 tablet (20 mg total) by mouth daily as needed. Take for weight gain > 3 lbs from baseline 140 lbs  or shortness of breath 90 tablet 1   traMADol  (ULTRAM ) 50 MG tablet Take 1 tablet (50 mg total) by mouth every 8 (eight) hours as needed for moderate pain (pain score 4-6) or severe pain (pain score 7-10) (pain). 20 tablet 0   No current facility-administered medications for this visit.    REVIEW OF SYSTEMS:    10 Point review of Systems was done is negative except as noted above.   PHYSICAL EXAMINATION: ECOG PERFORMANCE STATUS: 2 - Symptomatic, <50% confined to bed  . Vitals:   09/12/23 1309  BP: (!) 148/69  Pulse: 77  Resp: 18  Temp: (!) 97.5 F (36.4 C)  SpO2: 97%     Filed Weights   .There is no height or weight on file to calculate BMI.   GENERAL:alert, in no acute distress and comfortable SKIN: no acute rashes, no significant lesions EYES: conjunctiva are pink and non-injected, sclera anicteric OROPHARYNX: MMM, no exudates, no oropharyngeal erythema or ulceration NECK: supple, no JVD LYMPH:  no palpable lymphadenopathy in the cervical, axillary or inguinal regions LUNGS: clear to auscultation b/l with normal respiratory effort HEART: regular rate & rhythm ABDOMEN:   normoactive bowel sounds , non tender, still with abdominal distension esp on the rt side. Extremity: no pedal edema PSYCH: alert & oriented x 3 with fluent speech NEURO: no focal motor/sensory  deficits   LABORATORY DATA:  I have reviewed the data as listed  .    Latest Ref Rng & Units 09/12/2023   12:13 PM 09/05/2023    1:46 AM 09/04/2023    2:26 AM  CBC  WBC 4.0 - 10.5 K/uL 17.1  14.5  14.1   Hemoglobin 12.0 - 15.0 g/dL 9.0  8.5  9.1   Hematocrit 36.0 - 46.0 % 29.4  29.0  30.9   Platelets 150 - 400 K/uL 189  127  131    .    Latest Ref Rng & Units 09/12/2023   12:13 PM 09/05/2023    1:46 AM 09/04/2023    2:26 AM  CMP  Glucose 70 - 99 mg/dL 85  82  82   BUN 8 - 23 mg/dL 17  44  53   Creatinine 0.44 - 1.00 mg/dL 1.61  0.96  0.45   Sodium 135 - 145 mmol/L 141  138  141   Potassium 3.5 - 5.1 mmol/L 4.0  4.5  4.4   Chloride 98 - 111 mmol/L 108  109  107   CO2 22 - 32 mmol/L 27  22  23    Calcium 8.9 - 10.3 mg/dL 8.1  7.9  8.0   Total Protein 6.5 - 8.1 g/dL 5.1  4.6    Total Bilirubin 0.0 - 1.2 mg/dL 0.6  0.8    Alkaline Phos 38 - 126 U/L 121  129    AST 15 - 41 U/L 14  28    ALT 0 - 44 U/L 8  14     . Lab Results  Component Value Date   LDH 624 (H) 08/28/2023     PATHOLOGY Surgical Pathology CASE: [id number] PATIENT: Jacqueline Hancock Bone Marrow Report     Clinical History: lymphocytosis, possible lymphoma     DIAGNOSIS:  BONE MARROW, ASPIRATE, CLOT, CORE: -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  The latter is primarily seen by flow cytometry.  The findings favor mantle cell lymphoma but correlation with cytogenetic and FISH studies  is recommended.  MICROSCOPIC DESCRIPTION:  PERIPHERAL BLOOD SMEAR: The red blood cells display prominent anisocytosis with macrocytic and normocytic cells.  There is mild to moderate poikilocytosis with teardrop cells, elliptocytes.  There is moderate polychromasia.  The white blood cells are increased in number with lymphocytosis characterized by predominance of small to medium sized lymphoid cells displaying high nuclear cytoplasmic ratio, round to irregular nuclei, coarse chromatin and small to inconspicuous nucleoli. The platelets are normal in number.     RADIOGRAPHIC STUDIES: I have personally reviewed the radiological images as listed and agreed with the findings in the report. VAS US  UPPER EXTREMITY VENOUS DUPLEX Result Date: 09/04/2023 UPPER VENOUS STUDY  Patient Name:  Jacqueline Hancock  Date of Exam:   09/03/2023 Medical Rec #: [id number]    Accession #:    [id number] Date of Birth: [birth year]    Patient Gender: F Patient Age:   19 years Exam Location:  [facility] Procedure:      VAS US  UPPER EXTREMITY VENOUS DUPLEX Referring Phys: STEPHEN CHIU --------------------------------------------------------------------------------  Indications: Swelling Risk Factors: Cancer. Comparison Study: No prior studies. Performing Technologist: Lerry Ransom RVT  Examination Guidelines: A complete evaluation includes B-mode imaging,  spectral Doppler, color Doppler, and power Doppler as needed of all accessible portions of each vessel. Bilateral testing is considered an integral part of a complete examination. Limited examinations for reoccurring indications may be performed as noted.  Right Findings: +----------+------------+---------+-----------+----------+-------+ RIGHT     CompressiblePhasicitySpontaneousPropertiesSummary +----------+------------+---------+-----------+----------+-------+ IJV           Full       Yes       Yes                       +----------+------------+---------+-----------+----------+-------+ Subclavian               Yes       Yes                      +----------+------------+---------+-----------+----------+-------+ Axillary      Full       Yes       Yes                      +----------+------------+---------+-----------+----------+-------+ Brachial      Full                                          +----------+------------+---------+-----------+----------+-------+ Radial        Full                                          +----------+------------+---------+-----------+----------+-------+ Ulnar         Full                                          +----------+------------+---------+-----------+----------+-------+ Cephalic      Full                                          +----------+------------+---------+-----------+----------+-------+ Basilic       Full                                          +----------+------------+---------+-----------+----------+-------+  Left Findings: +----------+------------+---------+-----------+----------+-------+ LEFT      CompressiblePhasicitySpontaneousPropertiesSummary +----------+------------+---------+-----------+----------+-------+ IJV           Full       Yes       Yes                      +----------+------------+---------+-----------+----------+-------+ Subclavian               Yes       Yes                      +----------+------------+---------+-----------+----------+-------+ Axillary      Full       Yes       Yes                      +----------+------------+---------+-----------+----------+-------+ Brachial      Full                                          +----------+------------+---------+-----------+----------+-------+  Radial        Full                                          +----------+------------+---------+-----------+----------+-------+ Ulnar         Full                                           +----------+------------+---------+-----------+----------+-------+ Cephalic      Full                                          +----------+------------+---------+-----------+----------+-------+ Basilic       Full                                          +----------+------------+---------+-----------+----------+-------+  Summary:  Right: No evidence of deep vein thrombosis in the upper extremity. No evidence of superficial vein thrombosis in the upper extremity.  Left: No evidence of deep vein thrombosis in the upper extremity. No evidence of superficial vein thrombosis in the upper extremity.  *See table(s) above for measurements and observations.  Diagnosing physician: Delaney Fearing Electronically signed by Delaney Fearing on 09/04/2023 at 3:48:36 PM.    Final    CT ABDOMEN PELVIS WO CONTRAST Result Date: 08/28/2023 CLINICAL DATA:  Failure to thrive. 1st chemotherapy this week. Known lymphoma. * Tracking Code: BO * EXAM: CT ABDOMEN AND PELVIS WITHOUT CONTRAST TECHNIQUE: Multidetector CT imaging of the abdomen and pelvis was performed following the standard protocol without IV contrast. RADIATION DOSE REDUCTION: This exam was performed according to the departmental dose-optimization program which includes automated exposure control, adjustment of the mA and/or kV according to patient size and/or use of iterative reconstruction technique. COMPARISON:  08/09/2023 FINDINGS: Lower chest: Initial thickening at the lung bases likely due to subsegmental atelectasis and/or air trapping. Normal heart size without pericardial or pleural effusion. Hepatobiliary: Normal noncontrast appearance of the liver, gallbladder. No gross intrahepatic biliary duct dilatation. Pancreas: Normal, without mass or ductal dilatation. Spleen: Normal in size, without focal abnormality. Adrenals/Urinary Tract: Normal left adrenal gland. No left renal calculi. Mild left-sided caliectasis is unchanged. The dominant mass  centered in the right abdominal retroperitoneum is amorphous, ill-defined and difficult to on this noncontrast exam. Estimated at 12.6 by 9.6 cm on 38/2, relatively similar to 08/09/2023. This is encompasses and obscures the right adrenal gland right kidney. Amorphous soft tissue extends into the left-sided abdominal retroperitoneum including on 43/2, relatively similar. Soft tissue thickening is most apparent about the posterior bladder wall with suggestion of soft tissue fullness about the right bladder base including on 73/2. This is less masslike than on the prior exam. Stomach/Bowel: Tiny hiatal hernia. No gastric outlet obstruction. Normal colon and terminal ileum. Normal small bowel caliber. Vascular/Lymphatic: Aortic atherosclerosis. Prominent but not pathologically sized abdominal retroperitoneal nodes. Reproductive: Hysterectomy.  No adnexal mass. Other: Pelvic floor laxity. Small volume perihepatic ascites, decreased. Musculoskeletal: No acute osseous abnormality. IMPRESSION: 1. Limited exam secondary to lack of oral or IV contrast. 2. Grossly similar appearance of amorphous, ill-defined soft tissue density mass within the abdominopelvic right-sided  retroperitoneum. Extension or adjacent presumed lymphoma within the left-sided abdominal retroperitoneum. 3. No gross bowel obstruction or other acute complication. 4. Similar mild left-sided caliectasis, likely due to left abdominal retroperitoneal lymphoma. 5. Soft tissue thickening about the posterior bladder wall with decreased soft tissue fullness about the right bladder base. Suspicious for lymphomatous involvement. 6. Decrease in small volume perihepatic ascites 7. Incidental findings, including: Aortic Atherosclerosis (ICD10-I70.0). Tiny hiatal hernia. Electronically Signed   By: Lore Rode M.D.   On: 08/28/2023 16:51   DG Chest Portable 1 View Result Date: 08/28/2023 CLINICAL DATA:  Weakness, lightheadedness, dizziness, history of lymphoma EXAM:  PORTABLE CHEST 1 VIEW COMPARISON:  08/15/2023 FINDINGS: Single frontal view of the chest demonstrates right chest wall port via internal jugular approach, tip overlying superior vena cava. Cardiac silhouette is unremarkable. No acute airspace disease, effusion, or pneumothorax. No acute bony abnormalities. IMPRESSION: 1. No acute intrathoracic process. Electronically Signed   By: Bobbye Burrow M.D.   On: 08/28/2023 15:31   ECHOCARDIOGRAM COMPLETE Result Date: 08/22/2023    ECHOCARDIOGRAM REPORT   Patient Name:   Jacqueline Hancock Date of Exam: 08/22/2023 Medical Rec #:  [id number]   Height:       59.0 in Accession #:    [id number]  Weight:       140.0 lb Date of Birth:  28-Jun-1940   BSA:          1.585 m Patient Age:    83 years    BP:           114/63 mmHg Patient Gender: F           HR:           78 bpm. Exam Location:  Inpatient Procedure: 2D Echo, Color Doppler and Cardiac Doppler (Both Spectral and Color            Flow Doppler were utilized during procedure). Indications:    Chemo  History:        Patient has prior history of Echocardiogram examinations, most                 recent 03/22/2023.  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 IMPRESSIONS  1. Left ventricular ejection fraction, by estimation, is 60 to 65%. The left ventricle has normal function. The left ventricle has no regional wall motion abnormalities. Left ventricular diastolic parameters are consistent with Grade I diastolic dysfunction (impaired relaxation). The average left ventricular global longitudinal strain is -17.5 %. The global longitudinal strain is normal.  2. Right ventricular systolic function is normal. The right ventricular size is normal.  3. The mitral valve is normal in structure. Mild to moderate mitral valve regurgitation. No evidence of mitral stenosis.  4. The aortic valve is grossly normal. Aortic valve regurgitation is trivial.  No aortic stenosis is present.  5. The inferior vena cava is normal in size with greater than 50% respiratory variability, suggesting right atrial pressure of 3 mmHg. Comparison(s): No significant change from prior study. Prior images reviewed side by side. FINDINGS  Left Ventricle: Left ventricular ejection fraction, by estimation, is 60 to 65%. The left ventricle has normal function. The left ventricle has no regional wall motion abnormalities. The average left ventricular global longitudinal strain is -17.5 %. Strain was performed and the global longitudinal strain is normal. The left ventricular internal cavity size was normal in size. There is no left  ventricular hypertrophy. Left ventricular diastolic parameters are consistent with Grade I diastolic dysfunction (impaired relaxation). Normal left ventricular filling pressure. Right Ventricle: The right ventricular size is normal. No increase in right ventricular wall thickness. Right ventricular systolic function is normal. Left Atrium: Left atrial size was normal in size. Right Atrium: Right atrial size was normal in size. Pericardium: There is no evidence of pericardial effusion. Mitral Valve: The mitral valve is normal in structure. Mild to moderate mitral valve regurgitation, with centrally-directed jet. No evidence of mitral valve stenosis. Tricuspid Valve: The tricuspid valve is normal in structure. Tricuspid valve regurgitation is mild . No evidence of tricuspid stenosis. Aortic Valve: The aortic valve is grossly normal. Aortic valve regurgitation is trivial. No aortic stenosis is present. Aortic valve mean gradient measures 4.0 mmHg. Aortic valve peak gradient measures 10.9 mmHg. Aortic valve area, by VTI measures 1.86 cm. Pulmonic Valve: The pulmonic valve was normal in structure. Pulmonic valve regurgitation is not visualized. No evidence of pulmonic stenosis. Aorta: The aortic root is normal in size and structure. Venous: The inferior vena cava is  normal in size with greater than 50% respiratory variability, suggesting right atrial pressure of 3 mmHg. IAS/Shunts: No atrial level shunt detected by color flow Doppler.  LEFT VENTRICLE PLAX 2D LVIDd:         3.90 cm     Diastology LVIDs:         2.20 cm     LV e' medial:    8.49 cm/s LV PW:         1.00 cm     LV E/e' medial:  7.9 LV IVS:        0.90 cm     LV e' lateral:   10.60 cm/s LVOT diam:     1.70 cm     LV E/e' lateral: 6.3 LV SV:         46 LV SV Index:   29          2D Longitudinal Strain LVOT Area:     2.27 cm    2D Strain GLS Avg:     -17.5 %  LV Volumes (MOD) LV vol d, MOD A2C: 49.6 ml LV vol d, MOD A4C: 54.7 ml LV vol s, MOD A2C: 16.9 ml LV vol s, MOD A4C: 24.9 ml LV SV MOD A2C:     32.7 ml LV SV MOD A4C:     54.7 ml LV SV MOD BP:      32.7 ml RIGHT VENTRICLE RV S prime:     16.90 cm/s TAPSE (M-mode): 2.0 cm LEFT ATRIUM             Index LA diam:        3.10 cm 1.96 cm/m LA Vol (A2C):   22.3 ml 14.07 ml/m LA Vol (A4C):   24.7 ml 15.58 ml/m LA Biplane Vol: 24.1 ml 15.21 ml/m  AORTIC VALVE AV Area (Vmax):    1.69 cm AV Area (Vmean):   1.85 cm AV Area (VTI):     1.86 cm AV Vmax:           165.00 cm/s AV Vmean:          86.200 cm/s AV VTI:            0.247 m AV Peak Grad:      10.9 mmHg AV Mean Grad:      4.0 mmHg LVOT Vmax:         123.00 cm/s LVOT  Vmean:        70.300 cm/s LVOT VTI:          0.202 m LVOT/AV VTI ratio: 0.82 MITRAL VALVE               TRICUSPID VALVE MV Area (PHT): 4.33 cm    TR Peak grad:   33.4 mmHg MV Decel Time: 175 msec    TR Vmax:        289.00 cm/s MR Peak grad: 147.4 mmHg MR Mean grad: 111.0 mmHg   SHUNTS MR Vmax:      607.00 cm/s  Systemic VTI:  0.20 m MR Vmean:     500.0 cm/s   Systemic Diam: 1.70 cm MV E velocity: 66.70 cm/s MV A velocity: 99.00 cm/s MV E/A ratio:  0.67 Mihai Croitoru MD Electronically signed by Luana Rumple MD Signature Date/Time: 08/22/2023/2:33:18 PM    Final    IR IMAGING GUIDED PORT INSERTION Result Date: 08/21/2023 CLINICAL DATA:  B-CELL  LYMPHOMA, ACCESS FOR CHEMOTHERAPY EXAM: RIGHT INTERNAL JUGULAR SINGLE LUMEN POWER PORT CATHETER INSERTION Date:  08/21/2023 08/21/2023 9:33 am Radiologist:  Melven Stable. Alyssa Jumper, MD Guidance:  Ultrasound and fluoroscopic MEDICATIONS: 1% lidocaine  local with epinephrine  ANESTHESIA/SEDATION: Versed  1.0 mg IV; Fentanyl  50 mcg IV; Moderate Sedation Time:  28 minute The patient was continuously monitored during the procedure by the interventional radiology nurse under my direct supervision. FLUOROSCOPY: One minutes, 24 seconds (15 mGy) COMPLICATIONS: None immediate. CONTRAST:  None. PROCEDURE: Informed consent was obtained from the patient following explanation of the procedure, risks, benefits and alternatives. The patient understands, agrees and consents for the procedure. All questions were addressed. A time out was performed. Maximal barrier sterile technique utilized including caps, mask, sterile gowns, sterile gloves, large sterile drape, hand hygiene, and 2% chlorhexidine  scrub. Under sterile conditions and local anesthesia, right internal JV micropuncture venous access was performed. Access was performed with ultrasound. Images were obtained for documentation of the patent right internal jugular vein. A guide wire was inserted followed by a transitional dilator. This allowed insertion of a guide wire and catheter into the IVC. Measurements were obtained from the SVC / RA junction back to the right IJ venotomy site. In the right infraclavicular chest, a subcutaneous pocket was created over the second anterior rib. This was done under sterile conditions and local anesthesia. 1% lidocaine  with epinephrine  was utilized for this. A 2.5 cm incision was made in the skin. Blunt dissection was performed to create a subcutaneous pocket over the right pectoralis major muscle. The pocket was flushed with saline vigorously. There was adequate hemostasis. The port catheter was assembled and checked for leakage. The port catheter was  secured in the pocket with two retention sutures. The tubing was tunneled subcutaneously to the right venotomy site and inserted into the SVC/RA junction through a valved peel-away sheath. Position was confirmed with fluoroscopy. Images were obtained for documentation. The patient tolerated the procedure well. No immediate complications. Incisions were closed in a two layer fashion with 4 - 0 Vicryl suture. Dermabond was applied to the skin. The port catheter was accessed, blood was aspirated followed by saline and heparin  flushes. Needle was removed. A dry sterile dressing was applied. IMPRESSION: Ultrasound and fluoroscopically guided right internal jugular single lumen power port catheter insertion. Tip in the SVC/RA junction. Catheter ready for use. Electronically Signed   By: Melven Stable.  Shick M.D.   On: 08/21/2023 09:57   DG ESOPHAGUS W SINGLE CM (SOL OR THIN BA) Addendum Date: 08/19/2023 ADDENDUM  REPORT: 08/19/2023 08:35 ADDENDUM: Addendum for clarification of body of report. Hiatus hernia without evidence of stricture. Electronically Signed   By: Art Largo M.D.   On: 08/19/2023 08:35   Result Date: 08/19/2023 CLINICAL DATA:  dysphagia 83 year old female with dysphagia and globus sensation for esophagram. EXAM: ESOPHAGUS/BARIUM SWALLOW/TABLET STUDY TECHNIQUE: Single contrast examination was performed using thin liquid barium. This exam was performed by Abigail C. Emerson, PA-C, and was supervised and interpreted by Dr. Darylene Epley. FLUOROSCOPY: Radiation Exposure Index and estimated peak skin dose (PSD); Reference air kerma (RAK), 29.4 mGy. Kerma-area product (KAP), 409.9 uGy*m. COMPARISON:  CT AP, 12/02/2022 FINDINGS: Swallowing: Appears normal. No vestibular penetration or aspiration seen. Pharynx: Unremarkable. Esophagus: No mucosal lesions. Smooth, tapering stricture above gastroesophageal junction. Esophageal motility: Moderate esophageal dysmotility visualized with resultant contrast stasis and delayed  emptying into the stomach. Hiatal Hernia: Mild hiatal hernia. Gastroesophageal reflux: None visualized, even with provacative maneuvers. Ingested 13mm barium tablet: Became stuck just above the gastroesophageal junction, where the tapering stricture is visualized. Waited 3 minutes with subsequent swallows of water and barium, tablet remained stuck. Pt informed the tablet will dissolve on its own. Other: None. IMPRESSION: Suboptimal evaluation, secondary to patient's inability to remain upright. 1. Small hiatus hernia. 2. 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 Performed By Lorinda Root, PA-C and supervised by Art Largo, MD Electronically Signed: By: Art Largo M.D. On: 08/18/2023 09:35    ASSESSMENT & PLAN:   82 y.o. woman with:   1.  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 Though discussed with patient that FISH was neg and so Mantle cell lymphoma not confirm. Findings more consistent with CLL.   2.  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   3.  High risk of tumor lysis syndrome   4.  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   5) Hypercalcemia - ? 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 CBC showed WBC of 17.1K, hemoglobin of 9.0, and platelets of 189K. -WBCs increased from shot -platelets normal -she continues to be anemic, though hgb has improved from 8.5 to 9 in one week. We will continue to monitor her hgb to evaluate whether there may be need for blood transfusion. -there is no indication for blood transfusion at this time -kidney numbers improved -CMP shows albumin   improved from 2.2 g/dL one week ago to 2.9 g/dL. Creatinine improved to 2.17 from 2.25 -her swallowing issues related to benign esophageal stricture have been addressed with stretching -patient is noted to be on Entecavir , and her infectious disease doctor is okay with patient receiving rituxan  with second cycle of treatment -will add Rituxan  antibody with second treatment.Aaron Aasokay per ID with regards to hep B suppression with Entecavir . -we will continue her other treatment at current doses -patient is appropriate and agreeable to proceeding with cycle 2 day 1 of R-mini CHOP treatment tomorrow -discussed that there will likely be fatigue with treatment. To improve fatigue, I recommend drinking 2L of water daily, optimizing food intake, and staying physically active 20-30 minutes daily -discussed importance of optimizing her nutrition, staying well-hydrated, and keeping her strength up to prevent hospitalizations -She is on allopurinol  for  TLS prophylaxis -follow-up with infectious disease for management of her chronic active hepatitis B -discussed that we will have to monitor her left lower extremity lesion. Discussed that this could possibly be from an injury, healing scar, or age-related skin thickening -if there is persistent skin lesion or growth, there may be a need for biopsy by dermatologist -answered all of patient's and her son's questions in detail  FOLLOW-UP: F/u with C2 of R-mini CHOP on 6/11 RTC with Dr Salomon Cree with portflush and labs in 10 -12 days for toxicity check Plz schedule C3 of R-mini CHOP F/u with ID for mx of Hep B as per their recommedations  The total time spent in the appointment was 41 minutes* .  All of the patient's questions were answered with apparent satisfaction. The patient knows to call the clinic with any problems, questions or concerns.   Jacquelyn Matt MD MS AAHIVMS [facility] Hematology/Oncology Physician [facility]  .*Total Encounter Time as  defined by the Centers for Medicare and Medicaid Services includes, in addition to the face-to-face time of a patient visit (documented in the note above) non-face-to-face time: obtaining and reviewing outside history, ordering and reviewing medications, tests or procedures, care coordination (communications with other health care professionals or caregivers) and documentation in the medical record.    I,Mitra Faeizi,acting as a Neurosurgeon for Jacquelyn Matt, MD.,have documented all relevant documentation on the behalf of Jacquelyn Matt, MD,as directed by  Jacquelyn Matt, MD while in the presence of Jacquelyn Matt, MD.  .I have reviewed the above documentation for accuracy and completeness, and I agree with the above. .Kellis Mcadam Kishore Indyah Saulnier MD

## 2023-09-12 NOTE — Progress Notes (Signed)
 Patient seen by Dr. Minnie Amber are within treatment parameters.  Labs reviewed: and are not all within treatment parameters.   CR: 2.17  Per physician team, patient is ready for treatment and there are NO modifications to the treatment plan.  Pt for tx on 09/13/23

## 2023-09-13 ENCOUNTER — Other Ambulatory Visit

## 2023-09-13 ENCOUNTER — Inpatient Hospital Stay

## 2023-09-13 VITALS — BP 127/57 | HR 80 | Temp 98.3°F | Resp 18 | Wt 146.0 lb

## 2023-09-13 DIAGNOSIS — C8333 Diffuse large B-cell lymphoma, intra-abdominal lymph nodes: Secondary | ICD-10-CM | POA: Diagnosis not present

## 2023-09-13 DIAGNOSIS — C8338 Diffuse large B-cell lymphoma, lymph nodes of multiple sites: Secondary | ICD-10-CM

## 2023-09-13 MED ORDER — VINCRISTINE SULFATE CHEMO INJECTION 1 MG/ML
1.0000 mg | Freq: Once | INTRAVENOUS | Status: AC
  Administered 2023-09-13: 1 mg via INTRAVENOUS
  Filled 2023-09-13: qty 1

## 2023-09-13 MED ORDER — DOXORUBICIN HCL CHEMO IV INJECTION 2 MG/ML
25.0000 mg/m2 | Freq: Once | INTRAVENOUS | Status: AC
  Administered 2023-09-13: 42 mg via INTRAVENOUS
  Filled 2023-09-13: qty 21

## 2023-09-13 MED ORDER — ACETAMINOPHEN 325 MG PO TABS
650.0000 mg | ORAL_TABLET | Freq: Once | ORAL | Status: AC
  Administered 2023-09-13: 650 mg via ORAL
  Filled 2023-09-13: qty 2

## 2023-09-13 MED ORDER — FAMOTIDINE IN NACL 20-0.9 MG/50ML-% IV SOLN
20.0000 mg | Freq: Once | INTRAVENOUS | Status: AC
  Administered 2023-09-13: 20 mg via INTRAVENOUS
  Filled 2023-09-13: qty 50

## 2023-09-13 MED ORDER — PALONOSETRON HCL INJECTION 0.25 MG/5ML
0.2500 mg | Freq: Once | INTRAVENOUS | Status: AC
  Administered 2023-09-13: 0.25 mg via INTRAVENOUS
  Filled 2023-09-13: qty 5

## 2023-09-13 MED ORDER — SODIUM CHLORIDE 0.9 % IV SOLN
150.0000 mg | Freq: Once | INTRAVENOUS | Status: AC
  Administered 2023-09-13: 150 mg via INTRAVENOUS
  Filled 2023-09-13: qty 150

## 2023-09-13 MED ORDER — SODIUM CHLORIDE 0.9 % IV SOLN
400.0000 mg/m2 | Freq: Once | INTRAVENOUS | Status: AC
  Administered 2023-09-13: 660 mg via INTRAVENOUS
  Filled 2023-09-13: qty 33

## 2023-09-13 MED ORDER — SODIUM CHLORIDE 0.9 % IV SOLN: INTRAVENOUS | Status: DC

## 2023-09-13 MED ORDER — SODIUM CHLORIDE 0.9 % IV SOLN
375.0000 mg/m2 | Freq: Once | INTRAVENOUS | Status: AC
  Administered 2023-09-13: 600 mg via INTRAVENOUS
  Filled 2023-09-13: qty 10

## 2023-09-13 MED ORDER — DIPHENHYDRAMINE HCL 25 MG PO CAPS
50.0000 mg | ORAL_CAPSULE | Freq: Once | ORAL | Status: AC
  Administered 2023-09-13: 50 mg via ORAL
  Filled 2023-09-13: qty 2

## 2023-09-13 MED ORDER — DEXAMETHASONE SODIUM PHOSPHATE 10 MG/ML IJ SOLN
10.0000 mg | Freq: Once | INTRAMUSCULAR | Status: AC
  Administered 2023-09-13: 10 mg via INTRAVENOUS
  Filled 2023-09-13: qty 1

## 2023-09-13 NOTE — Patient Instructions (Signed)
 [facility] - A DEPT OF [facility]  Discharge Instructions: Thank you for choosing [facility] to provide your oncology and hematology care.   If you have a lab appointment with the Cancer Center, please go directly to the Cancer Center and check in at the registration area.   Wear comfortable clothing and clothing appropriate for easy access to any Portacath or PICC line.   We strive to give you quality time with your provider. You may need to reschedule your appointment if you arrive late (15 or more minutes).  Arriving late affects you and other patients whose appointments are after yours.  Also, if you miss three or more appointments without notifying the office, you may be dismissed from the clinic at the provider's discretion.      For prescription refill requests, have your pharmacy contact our office and allow 72 hours for refills to be completed.    Today you received the following chemotherapy and/or immunotherapy agents rituxan, vincristine , adriamycin , cyclophosphamide       To help prevent nausea and vomiting after your treatment, we encourage you to take your nausea medication as directed.  BELOW ARE SYMPTOMS THAT SHOULD BE REPORTED IMMEDIATELY: *FEVER GREATER THAN 100.4 F (38 C) OR HIGHER *CHILLS OR SWEATING *NAUSEA AND VOMITING THAT IS NOT CONTROLLED WITH YOUR NAUSEA MEDICATION *UNUSUAL SHORTNESS OF BREATH *UNUSUAL BRUISING OR BLEEDING *URINARY PROBLEMS (pain or burning when urinating, or frequent urination) *BOWEL PROBLEMS (unusual diarrhea, constipation, pain near the anus) TENDERNESS IN MOUTH AND THROAT WITH OR WITHOUT PRESENCE OF ULCERS (sore throat, sores in mouth, or a toothache) UNUSUAL RASH, SWELLING OR PAIN  UNUSUAL VAGINAL DISCHARGE OR ITCHING   Items with * indicate a potential emergency and should be followed up as soon as possible or go to the Emergency Department if any problems should occur.  Please show the  CHEMOTHERAPY ALERT CARD or IMMUNOTHERAPY ALERT CARD at check-in to the Emergency Department and triage nurse.  Should you have questions after your visit or need to cancel or reschedule your appointment, please contact [facility] - A DEPT OF [facility]  Dept: [phone]  and follow the prompts.  Office hours are 8:00 a.m. to 4:30 p.m. Monday - Friday. Please note that voicemails left after 4:00 p.m. may not be returned until the following business day.  We are closed weekends and major holidays. You have access to a nurse at all times for urgent questions. Please call the main number to the clinic Dept: [phone] and follow the prompts.   For any non-urgent questions, you may also contact your provider using MyChart. We now offer e-Visits for anyone 80 and older to request care online for non-urgent symptoms. For details visit [email or web address].   Also download the MyChart app! Go to the app store, search MyChart, open the app, select [place], and log in with your MyChart username and password.

## 2023-09-13 NOTE — Progress Notes (Signed)
 Rapid Infusion Rituximab Pharmacist Evaluation  Jacqueline Hancock is a 83 y.o. female being treated with rituximab for CLL. This patient may be considered for RIR.   A pharmacist has verified the patient tolerated rituximab infusions per the [facility] standard infusion protocol without grade 3-4 infusion reactions. The treatment plan will be updated to reflect RIR if the patient qualifies per the checklist below:   Age > 56 years old Yes   Clinically significant cardiovascular disease No   Circulating lymphocyte count < 5000/uL prior to cycle two Yes  Lab Results  Component Value Date   LYMPHSABS 1.9 09/12/2023    Prior documented grade 3-4 infusion reaction to rituximab No   Prior documented grade 1-2 infusion reaction to rituximab (If YES, Pharmacist will confirm with Physician if patient is still a candidate for RIR) No   Previous rituximab infusion within the past 6 months Yes   Treatment Plan updated orders to reflect RIR Awaiting MD review; pt w/ elevated SCr, adv age and h/o hep B.   [name] does meet the criteria for Rapid Infusion Rituximab. MD to review before pharmacy switch to rapid.  Talin Feister, Pharm.D., CPP 09/13/2023@5 :00 PM

## 2023-09-14 LAB — HEPATITIS B CORE ANTIBODY, TOTAL: HEP B CORE AB: POSITIVE — AB

## 2023-09-14 NOTE — Progress Notes (Signed)
 Per Dr Salomon Cree, ok to proceed w/ rapid RTX.  Plan updated.  Dillan Lunden, Pharm.D., CPP 09/14/2023@3 :52 PM

## 2023-09-15 ENCOUNTER — Inpatient Hospital Stay

## 2023-09-15 VITALS — BP 140/67 | HR 85 | Temp 98.6°F | Resp 16

## 2023-09-15 DIAGNOSIS — C8333 Diffuse large B-cell lymphoma, intra-abdominal lymph nodes: Secondary | ICD-10-CM | POA: Diagnosis not present

## 2023-09-15 DIAGNOSIS — C8338 Diffuse large B-cell lymphoma, lymph nodes of multiple sites: Secondary | ICD-10-CM

## 2023-09-15 MED ORDER — PEGFILGRASTIM-CBQV 6 MG/0.6ML ~~LOC~~ SOSY
6.0000 mg | PREFILLED_SYRINGE | Freq: Once | SUBCUTANEOUS | Status: AC
  Administered 2023-09-15: 6 mg via SUBCUTANEOUS
  Filled 2023-09-15: qty 0.6

## 2023-09-15 NOTE — Patient Instructions (Signed)

## 2023-09-17 ENCOUNTER — Encounter: Payer: Self-pay | Admitting: Hematology

## 2023-09-18 ENCOUNTER — Telehealth: Payer: Self-pay

## 2023-09-18 ENCOUNTER — Ambulatory Visit: Payer: Self-pay

## 2023-09-18 DIAGNOSIS — Z9181 History of falling: Secondary | ICD-10-CM

## 2023-09-18 DIAGNOSIS — C8338 Diffuse large B-cell lymphoma, lymph nodes of multiple sites: Secondary | ICD-10-CM | POA: Diagnosis not present

## 2023-09-18 DIAGNOSIS — I081 Rheumatic disorders of both mitral and tricuspid valves: Secondary | ICD-10-CM

## 2023-09-18 DIAGNOSIS — N179 Acute kidney failure, unspecified: Secondary | ICD-10-CM

## 2023-09-18 DIAGNOSIS — T451X5D Adverse effect of antineoplastic and immunosuppressive drugs, subsequent encounter: Secondary | ICD-10-CM

## 2023-09-18 DIAGNOSIS — J849 Interstitial pulmonary disease, unspecified: Secondary | ICD-10-CM

## 2023-09-18 DIAGNOSIS — E883 Tumor lysis syndrome: Secondary | ICD-10-CM

## 2023-09-18 DIAGNOSIS — I1310 Hypertensive heart and chronic kidney disease without heart failure, with stage 1 through stage 4 chronic kidney disease, or unspecified chronic kidney disease: Secondary | ICD-10-CM | POA: Diagnosis not present

## 2023-09-18 DIAGNOSIS — D630 Anemia in neoplastic disease: Secondary | ICD-10-CM | POA: Diagnosis not present

## 2023-09-18 DIAGNOSIS — N1832 Chronic kidney disease, stage 3b: Secondary | ICD-10-CM | POA: Diagnosis not present

## 2023-09-18 DIAGNOSIS — E440 Moderate protein-calorie malnutrition: Secondary | ICD-10-CM

## 2023-09-18 NOTE — Telephone Encounter (Signed)
 Copied from CRM [id number]. Topic: Clinical - Home Health Verbal Orders >> Sep 15, 2023  3:24 PM Danelle Dunning F wrote: Caller/AgencyStaci Dykes Number: [phone]   Service Requested: Skilled Nursing   Frequency:   Twice a week for 5 weeks   Any new concerns about the patient? Yes, Patient has a wound on her bottom that they would like to treat.  Looking to acquire a verbal order for the patient for the following educational services:   Patient education  Observation  Wound Care : They will be cleansing it with wound cleanser; applying medihoney and applying a foam border dressing

## 2023-09-18 NOTE — Telephone Encounter (Signed)
 FYI Only or Action Required?: FYI only for provider  Patient was last seen in primary care on 09/12/2023 by Ngetich, Elijio Guadeloupe, NP. Called Nurse Triage reporting Leg Swelling. Symptoms began several days ago. Interventions attempted: Rest, hydration, or home remedies. Symptoms are: unchanged.  Triage Disposition: See Physician Within 24 Hours  Patient/caregiver understands and will follow disposition?: Yes  Patient has some type of bruise on her lower left leg, There is some drainage coming from the leg that is clear. Patient's daughter in law Linder Revere can be reached at [phone].   Reason for Disposition  [1] MODERATE leg swelling (e.g., swelling extends up to knees) AND [2] new-onset or worsening  Answer Assessment - Initial Assessment Questions 1. ONSET: When did the swelling start? (e.g., minutes, hours, days)  Both  Legs are normally swollen 2. LOCATION: What part of the leg is swollen?  Are both legs swollen or just one leg?     Legs swollen up past her knees 3. SEVERITY: How bad is the swelling? (e.g., localized; mild, moderate, severe)   - Localized: Small area of swelling localized to one leg.   - MILD pedal edema: Swelling limited to foot and ankle, pitting edema < 1/4 inch (6 mm) deep, rest and elevation eliminate most or all swelling.   - MODERATE edema: Swelling of lower leg to knee, pitting edema > 1/4 inch (6 mm) deep, rest and elevation only partially reduce swelling.   - SEVERE edema: Swelling extends above knee, facial or hand swelling present.      severe 4. REDNESS: Does the swelling look red or infected?     denies 5. PAIN: Is the swelling painful to touch? If Yes, ask: How painful is it?   (Scale 1-10; mild, moderate or severe)     denies 6. FEVER: Do you have a fever? If Yes, ask: What is it, how was it measured, and when did it start?      denies 7. CAUSE: What do you think is causing the leg swelling?    Ongoing problem 8. MEDICAL HISTORY: Do  you have a history of blood clots (e.g., DVT), cancer, heart failure, kidney disease, or liver failure?     Cardiac/blood clot- no diagnosis 9. RECURRENT SYMPTOM: Have you had leg swelling before? If Yes, ask: When was the last time? What happened that time?     ongoing 10. OTHER SYMPTOMS: Do you have any other symptoms? (e.g., chest pain, difficulty breathing)       Weeping sore, pencil eraser size to left lower leg draining clear fluid   Also reports pressure sore to sacrum (head of eraser sized), broken skin as well as breakdown to both buttocks (nickel sized)  Protocols used: Leg Swelling and Edema-A-AH

## 2023-09-18 NOTE — Telephone Encounter (Signed)
 Call returned to Jacqueline Hancock, mailbox is full, and unable to leave a message  Reason for call:  1.) Verify which home health agency Jacqueline Hancock is calling from  2.) Authorize verbal orders per [facility] standing protocol

## 2023-09-18 NOTE — Telephone Encounter (Signed)
 Another outgoing call placed to Jacqueline Hancock, again no answer, and voicemail was full

## 2023-09-18 NOTE — Telephone Encounter (Signed)
 Copied from CRM [id number]. Topic: Clinical - Home Health Verbal Orders >> Sep 18, 2023 10:18 AM Jamas Maywood wrote: Caller/Agency: Jeanne/[facility] Callback Number: [phone] Service Requested: Speech Therapy Frequency: 1 a week 6 weeks Any new concerns about the patient? No    Call returned to [facility] and verbal orders authorized per [employer] standing order

## 2023-09-19 ENCOUNTER — Ambulatory Visit: Admitting: Family

## 2023-09-20 ENCOUNTER — Other Ambulatory Visit: Payer: Self-pay

## 2023-09-20 DIAGNOSIS — C8338 Diffuse large B-cell lymphoma, lymph nodes of multiple sites: Secondary | ICD-10-CM

## 2023-09-21 ENCOUNTER — Inpatient Hospital Stay: Admitting: Hematology

## 2023-09-21 ENCOUNTER — Telehealth: Payer: Self-pay

## 2023-09-21 ENCOUNTER — Other Ambulatory Visit

## 2023-09-21 ENCOUNTER — Inpatient Hospital Stay

## 2023-09-21 ENCOUNTER — Other Ambulatory Visit: Payer: Self-pay

## 2023-09-21 VITALS — BP 141/67 | HR 85 | Temp 97.5°F | Resp 18

## 2023-09-21 DIAGNOSIS — C8338 Diffuse large B-cell lymphoma, lymph nodes of multiple sites: Secondary | ICD-10-CM

## 2023-09-21 DIAGNOSIS — D649 Anemia, unspecified: Secondary | ICD-10-CM | POA: Diagnosis not present

## 2023-09-21 DIAGNOSIS — Z95828 Presence of other vascular implants and grafts: Secondary | ICD-10-CM

## 2023-09-21 DIAGNOSIS — Z5111 Encounter for antineoplastic chemotherapy: Secondary | ICD-10-CM | POA: Diagnosis not present

## 2023-09-21 DIAGNOSIS — C8333 Diffuse large B-cell lymphoma, intra-abdominal lymph nodes: Secondary | ICD-10-CM | POA: Diagnosis not present

## 2023-09-21 LAB — CMP (CANCER CENTER ONLY)
ALT: 11 U/L (ref 0–44)
AST: 19 U/L (ref 15–41)
Albumin: 3 g/dL — ABNORMAL LOW (ref 3.5–5.0)
Alkaline Phosphatase: 116 U/L (ref 38–126)
Anion gap: 8 (ref 5–15)
BUN: 25 mg/dL — ABNORMAL HIGH (ref 8–23)
CO2: 25 mmol/L (ref 22–32)
Calcium: 8.4 mg/dL — ABNORMAL LOW (ref 8.9–10.3)
Chloride: 108 mmol/L (ref 98–111)
Creatinine: 1.74 mg/dL — ABNORMAL HIGH (ref 0.44–1.00)
GFR, Estimated: 29 mL/min — ABNORMAL LOW (ref >60)
Glucose, Bld: 99 mg/dL (ref 70–99)
Potassium: 3.8 mmol/L (ref 3.5–5.1)
Sodium: 141 mmol/L (ref 135–145)
Total Bilirubin: 0.6 mg/dL (ref 0.0–1.2)
Total Protein: 5.2 g/dL — ABNORMAL LOW (ref 6.5–8.1)

## 2023-09-21 LAB — CBC WITH DIFFERENTIAL (CANCER CENTER ONLY)
Abs Immature Granulocytes: 0.01 10*3/uL (ref 0.00–0.07)
Basophils Absolute: 0.1 10*3/uL (ref 0.0–0.1)
Basophils Relative: 2 %
Eosinophils Absolute: 0 10*3/uL (ref 0.0–0.5)
Eosinophils Relative: 2 %
HCT: 20.4 % — ABNORMAL LOW (ref 36.0–46.0)
Hemoglobin: 6.2 g/dL — CL (ref 12.0–15.0)
Immature Granulocytes: 0 %
Lymphocytes Relative: 22 %
Lymphs Abs: 0.6 10*3/uL — ABNORMAL LOW (ref 0.7–4.0)
MCH: 27.1 pg (ref 26.0–34.0)
MCHC: 30.4 g/dL (ref 30.0–36.0)
MCV: 89.1 fL (ref 80.0–100.0)
Monocytes Absolute: 0.4 10*3/uL (ref 0.1–1.0)
Monocytes Relative: 16 %
Neutro Abs: 1.4 10*3/uL — ABNORMAL LOW (ref 1.7–7.7)
Neutrophils Relative %: 58 %
Platelet Count: 147 10*3/uL — ABNORMAL LOW (ref 150–400)
RBC: 2.29 MIL/uL — ABNORMAL LOW (ref 3.87–5.11)
RDW: 14.4 % (ref 11.5–15.5)
Smear Review: NORMAL
WBC Count: 2.5 10*3/uL — ABNORMAL LOW (ref 4.0–10.5)
nRBC: 0 % (ref 0.0–0.2)

## 2023-09-21 LAB — PREPARE RBC (CROSSMATCH)

## 2023-09-21 MED ORDER — SODIUM CHLORIDE 0.9% FLUSH
10.0000 mL | Freq: Once | INTRAVENOUS | Status: AC
  Administered 2023-09-21: 10 mL

## 2023-09-21 MED ORDER — HEPARIN SOD (PORK) LOCK FLUSH 100 UNIT/ML IV SOLN
500.0000 [IU] | Freq: Once | INTRAVENOUS | Status: AC
  Administered 2023-09-21: 500 [IU]

## 2023-09-21 NOTE — Telephone Encounter (Signed)
 Copied from CRM [id number]. Topic: Clinical - Home Health Verbal Orders >> Sep 07, 2023 12:04 PM Blair Bumpers wrote: Caller/Agency: [place], Physical Therapist, with [facility] Callback Number: [phone] Service Requested: Physical Therapy; needs verbal ok to begin PT Frequency: one week-one, two week-three, one week-one; speech therapy eval & treat; skilled nursing eval & treat for wound, home health aide two times a week for 4 weeks. Also need a prescription for a standard wheelchair sent to any DME that the office uses.  Any new concerns about the patient? No >> Sep 20, 2023  4:45 PM Tilton Fontan wrote: Polly Brink with [facility] called to follow up on request for ordered wheelchair. Please follow up with [facility].   Call returned to home health agency and verbal orders given.

## 2023-09-21 NOTE — Progress Notes (Signed)
 Provider: Christean Courts FNP-C   Chimene Salo, Elijio Guadeloupe, NP  Patient Care Team: Ora Mcnatt, Elijio Guadeloupe, NP as PCP - General (Family Medicine) Lenton Rail, MD as Consulting Physician (Otolaryngology) Serafin Dames, MD as Consulting Physician (Gastroenterology) Coy Ditty, Adelaida Adie, MD (Inactive) as Consulting Physician (Obstetrics and Gynecology) Albert Huff, MD as Consulting Physician (Ophthalmology) Monika Annas, [facility] (Inactive) as Pharmacist (Pharmacist)  Extended Emergency Contact Information Primary Emergency Contact: Ara Bays Address: [address]          [place], [place] [address] [place] Home Phone: [phone] Mobile Phone: [phone] Relation: Son Secondary Emergency Contact: Britz,Keith Mobile Phone: [phone] Relation: Son Preferred language: English Interpreter needed? No  Code Status: Full code Goals of care: Advanced Directive information    08/28/2023    3:06 PM  Advanced Directives  Does Patient Have a Medical Advance Directive? No  Does patient want to make changes to medical advance directive? No - Patient declined  Would patient like information on creating a medical advance directive? No - Patient declined     Chief Complaint  Patient presents with   Hospitalization Follow-up    Hospital follow up// chemo relapse      Discussed the use of AI scribe software for clinical note transcription with the patient, who gave verbal consent to proceed.  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  She was hospitalized from May 26 to June 3 due to generalized weakness, suspected to be a reaction to chemotherapy. Since discharge, she feels much better but continues to experience swelling in her legs. Her white blood cell count, initially low, normalized by discharge. She was treated for a drop in hemoglobin  to 6.6 g/dL on May 31, receiving a pint of blood. She continues to feel tired and weak.  She has a history of large B cell lymphoma and is scheduled for her second chemotherapy treatment tomorrow. 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 She is following up with her nephrologist for her stage 3 kidney disease.  She has hypertension, initially managed with metoprolol three times a day, which was reduced to twice a day upon discharge. Her blood pressure is currently well-controlled. She also has congestive heart failure, and her fluid pill was held due to kidney issues. An echocardiogram showed an ejection fraction of 60-65%.  She has a history of hepatitis B and is due for a follow-up with infectious disease. She continues to take Entecavir  and has an upcoming appointment for further evaluation.  She has experienced dysphagia and was seen by a gastroenterologist. A tube was placed on May 29, and she was noted to have dilated gastritis without bleeding or ulcers. She is on Protonix  40 mg twice daily for eight weeks, then once daily, and Carafate  1 g twice daily for four weeks.  She was previously on Diflucan  for a suspected fungal infection, which has been discontinued. No blood in stool, and no issues with constipation or diarrhea. Her appetite has improved slightly.  She is currently taking B complex, Miralax , Senokot, trazodone  50 mg as needed for pain, Compazine  10 mg every six hours as needed for nausea, Claritin 10 mg for allergies, and Minitran twice daily. No shortness of breath, diarrhea, constipation, or blood in stool. Occasional coughing when lying down and improved appetite.  Past Medical History:  Diagnosis Date   CLL (chronic lymphocytic leukemia) (HCC)    Dyspnea 2021   with exersion    Early cataracts, bilateral    Elevated hemoglobin A1c    Fibroids    GERD (gastroesophageal reflux disease)     Hyperlipidemia    Hypertension    Lymphoma (HCC)    Osteopenia    Past Surgical History:  Procedure Laterality Date   ABDOMINAL HYSTERECTOMY     APPENDECTOMY     BALLOON DILATION N/A 08/31/2023   Procedure: BALLOON DILATION;  Surgeon: Lajuan Pila, MD;  Location: [facility];  Service: Gastroenterology;  Laterality: N/A;   CATARACT EXTRACTION, BILATERAL Bilateral 09/2017   Dr. 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   TONSILLECTOMY AND ADENOIDECTOMY      Allergies  Allergen Reactions   Nexium [Esomeprazole Magnesium] Other (See Comments)    Headache   Peanut Butter Flavoring Agent (Non-Screening) Itching    Allergies as of 09/12/2023       Reactions   Nexium [esomeprazole Magnesium] Other (See Comments)   Headache   Peanut Butter Flavoring Agent (non-screening) Itching        Medication List        Accurate as of September 12, 2023 11:59 PM. If you have any questions, ask your nurse or doctor.          entecavir  0.5 MG tablet Commonly known as: BARACLUDE  Take 1 tablet (0.5 mg total) by mouth every 3 (three) days.   loratadine 10 MG tablet Commonly known as: CLARITIN Take 10 mg by mouth daily.   midodrine  10 MG tablet Commonly known as: PROAMATINE  Take 1 tablet (10 mg total) by mouth 2 (two) times daily with a meal.   pantoprazole  40 MG tablet Commonly known as: PROTONIX  Take 1 tablet (40 mg total) by mouth 2 (two) times daily for 56 days, THEN 1 tablet (40 mg total) daily. Start taking on: September 05, 2023   polyethylene glycol 17 g packet Commonly known as: MIRALAX  / GLYCOLAX  Take 17 g by mouth daily as needed for mild constipation or moderate constipation. Also available OTC   prochlorperazine  10 MG  tablet Commonly known as: COMPAZINE  Take 1 tablet (10 mg total) by mouth every 6 (six) hours as needed for nausea or vomiting.   senna-docusate 8.6-50 MG tablet Commonly known as: Senokot-S Take 2 tablets by mouth at bedtime. For constipation   sucralfate  1 GM/10ML suspension Commonly known as: CARAFATE  Take 10 mLs (1 g total) by mouth 2 (two) times daily for 28 days.   torsemide  20 MG tablet Commonly known as: DEMADEX  Take 1 tablet (20 mg total) by mouth daily as needed. Take for weight gain > 3 lbs from baseline 140 lbs  or shortness of breath Started by: Nelson Julson C Aften Lipsey   traMADol  50 MG tablet Commonly known as: ULTRAM  Take 1 tablet (50 mg total) by mouth every 8 (eight) hours as needed for moderate pain (pain score 4-6) or severe pain (pain score 7-10) (pain).   VITAMIN B-COMPLEX PO Take 1 tablet by mouth daily.        Review of Systems  Constitutional:  Positive for fatigue. Negative for appetite change, chills, fever and unexpected weight change.  HENT:  Positive for trouble  swallowing. Negative for congestion, dental problem, ear discharge, ear pain, facial swelling, hearing loss, nosebleeds, postnasal drip, rhinorrhea, sinus pressure, sinus pain, sneezing, sore throat and tinnitus.   Eyes:  Negative for pain, discharge, redness, itching and visual disturbance.  Respiratory:  Negative for cough, chest tightness, shortness of breath and wheezing.   Cardiovascular:  Negative for chest pain, palpitations and leg swelling.  Gastrointestinal:  Negative for abdominal distention, abdominal pain, blood in stool, constipation, diarrhea, nausea and vomiting.  Endocrine: Negative for cold intolerance, heat intolerance, polydipsia, polyphagia and polyuria.  Genitourinary:  Negative for difficulty urinating, dysuria, flank pain, frequency and urgency.  Musculoskeletal:  Negative for arthralgias, back pain, gait problem, joint swelling, myalgias, neck pain and neck stiffness.  Skin:   Negative for color change, pallor, rash and wound.  Neurological:  Negative for dizziness, syncope, speech difficulty, weakness, light-headedness, numbness and headaches.  Hematological:  Does not bruise/bleed easily.  Psychiatric/Behavioral:  Negative for agitation, behavioral problems, confusion, hallucinations, self-injury, sleep disturbance and suicidal ideas. The patient is not nervous/anxious.     Immunization History  Administered Date(s) Administered   Influenza, High Dose Seasonal PF 01/28/2015, 12/11/2015, 01/25/2017, 01/22/2018, 01/10/2019, 02/03/2019, 02/05/2020, 02/03/2021, 02/03/2022, 12/27/2022   Influenza-Unspecified 01/29/2013, 12/27/2013, 01/25/2017   PFIZER(Purple Top)SARS-COV-2 Vaccination 05/11/2019, 06/03/2019   PPD Test 02/25/2013   Pfizer Covid-19 Vaccine Bivalent Booster 66yrs & up 01/20/2022   Pneumococcal Conjugate-13 01/24/2017   Pneumococcal Polysaccharide-23 02/25/2013   Td 09/13/2001, 02/20/2012   Tdap 02/25/2013   Zoster, Live 10/23/2006   Pertinent  Health Maintenance Due  Topic Date Due   INFLUENZA VACCINE  11/03/2023   DEXA SCAN  Completed      08/05/2021   12:08 AM 08/17/2022   10:08 AM 06/14/2023   10:41 AM 07/18/2023    9:02 AM 09/12/2023   10:20 AM  Fall Risk  Falls in the past year? 0 0 1 0 1  Was there an injury with Fall?   0 0 1  Fall Risk Category Calculator   2 0 3  Patient at Risk for Falls Due to No Fall Risks  Impaired balance/gait;Impaired mobility History of fall(s) History of fall(s)  Fall risk Follow up Falls evaluation completed;Education provided;Falls prevention discussed   Falls evaluation completed Falls evaluation completed Falls evaluation completed     Data saved with a previous flowsheet row definition   Functional Status Survey:    Vitals:   09/12/23 1020  BP: 124/60  Pulse: 75  Resp: 16  Temp: (!) 97.2 F (36.2 C)  SpO2: 96%  Height: 4' 11 (1.499 m)   Body mass index is 29.88 kg/m. Physical  Exam  VITALS: BP- 124/60 MEASUREMENTS: Weight- 140. GENERAL: Alert, cooperative, well developed, no acute distress HEENT: Normocephalic, normal oropharynx, moist mucous membranes, ears and nose normal CHEST: Clear to auscultation bilaterally, no wheezes, rhonchi, or crackles CARDIOVASCULAR: Normal heart rate and rhythm, S1 and S2 normal without murmurs ABDOMEN: Soft, non-tender, non-distended, without organomegaly, normal bowel sounds RECTAL: Sores on buttocks, healing EXTREMITIES: Pitting and weeping edema in lower extremities, no cyanosis NEUROLOGICAL: Cranial nerves grossly intact, moves all extremities without gross motor or sensory deficit  SKIN: No rash,no lesion or erythema   PSYCHIATRY/BEHAVIORAL: Mood stable    Labs reviewed: Recent Labs    02/14/23 1119 03/14/23 1100 08/28/23 1518 08/29/23 0424 08/30/23 0539 08/31/23 0420 09/01/23 0516 09/02/23 0423 09/04/23 0226 09/05/23 0146 09/12/23 1213  NA 139   < > 136 133*   < > 141 141   < >  141 138 141  K 4.5   < > 5.3* 4.6   < > 4.5 4.6   < > 4.4 4.5 4.0  CL 102   < > 104 100   < > 101 106   < > 107 109 108  CO2 26   < > 15* 19*   < > 25 26   < > 23 22 27   GLUCOSE 98   < > 117* 126*   < > 96 90   < > 82 82 85  BUN 22   < > 132* 126*   < > 113* 99*   < > 53* 44* 17  CREATININE 1.19*   < > 4.23* 4.11*   < > 3.64* 3.38*   < > 2.63* 2.25* 2.17*  CALCIUM 10.2   < > 8.3* 7.1*   < > 6.7* 6.6*   < > 8.0* 7.9* 8.1*  MG 2.1  --  3.0*  --   --   --   --   --   --   --   --   PHOS  --    < > 10.3* 9.1*  --  7.0* 5.9*  --   --   --   --    < > = values in this interval not displayed.   Recent Labs    08/30/23 0539 08/31/23 0420 09/01/23 0516 09/05/23 0146 09/12/23 1213  AST 21  --   --  28 14*  ALT 13  --   --  14 8  ALKPHOS 84  --   --  129* 121  BILITOT 0.8  --   --  0.8 0.6  PROT 4.8*  --   --  4.6* 5.1*  ALBUMIN  2.2*   < > 2.1* 2.2* 2.9*   < > = values in this interval not displayed.   Recent Labs    08/28/23 1518  08/29/23 0424 08/30/23 0539 09/04/23 0226 09/05/23 0146 09/12/23 1213  WBC 39.6* 25.2*   < > 14.1* 14.5* 17.1*  NEUTROABS 38.8* 24.4*  --   --   --  13.6*  HGB 9.8* 8.4*   < > 9.1* 8.5* 9.0*  HCT 32.3* 26.5*   < > 30.9* 29.0* 29.4*  MCV 89.0 87.7   < > 93.4 93.9 89.1  PLT 197 163   < > 131* 127* 189   < > = values in this interval not displayed.   Lab Results  Component Value Date   TSH 2.193 08/28/2023   Lab Results  Component Value Date   HGBA1C 5.7 (H) 06/14/2023   Lab Results  Component Value Date   CHOL 140 06/14/2023   HDL 40 (L) 06/14/2023   LDLCALC 77 06/14/2023   TRIG 135 06/14/2023   CHOLHDL 3.5 06/14/2023    Significant Diagnostic Results in last 30 days:  VAS US  UPPER EXTREMITY VENOUS DUPLEX Result Date: 09/04/2023 UPPER VENOUS STUDY  Patient Name:  LASHAWNDA HANCOX  Date of Exam:   09/03/2023 Medical Rec #: [id number]    Accession #:    [id number] Date of Birth: 02-13-1941    Patient Gender: F Patient Age:   24 years Exam Location:  [facility] Procedure:      VAS US  UPPER EXTREMITY VENOUS DUPLEX Referring Phys: STEPHEN CHIU --------------------------------------------------------------------------------  Indications: Swelling Risk Factors: Cancer. Comparison Study: No prior studies. Performing Technologist: Lerry Ransom RVT  Examination Guidelines: A complete evaluation includes B-mode imaging, spectral Doppler, color Doppler, and  power Doppler as needed of all accessible portions of each vessel. Bilateral testing is considered an integral part of a complete examination. Limited examinations for reoccurring indications may be performed as noted.  Right Findings: +----------+------------+---------+-----------+----------+-------+ RIGHT     CompressiblePhasicitySpontaneousPropertiesSummary +----------+------------+---------+-----------+----------+-------+ IJV           Full       Yes       Yes                       +----------+------------+---------+-----------+----------+-------+ Subclavian               Yes       Yes                      +----------+------------+---------+-----------+----------+-------+ Axillary      Full       Yes       Yes                      +----------+------------+---------+-----------+----------+-------+ Brachial      Full                                          +----------+------------+---------+-----------+----------+-------+ Radial        Full                                          +----------+------------+---------+-----------+----------+-------+ Ulnar         Full                                          +----------+------------+---------+-----------+----------+-------+ Cephalic      Full                                          +----------+------------+---------+-----------+----------+-------+ Basilic       Full                                          +----------+------------+---------+-----------+----------+-------+  Left Findings: +----------+------------+---------+-----------+----------+-------+ LEFT      CompressiblePhasicitySpontaneousPropertiesSummary +----------+------------+---------+-----------+----------+-------+ IJV           Full       Yes       Yes                      +----------+------------+---------+-----------+----------+-------+ Subclavian               Yes       Yes                      +----------+------------+---------+-----------+----------+-------+ Axillary      Full       Yes       Yes                      +----------+------------+---------+-----------+----------+-------+ Brachial      Full                                          +----------+------------+---------+-----------+----------+-------+  Radial        Full                                          +----------+------------+---------+-----------+----------+-------+ Ulnar         Full                                           +----------+------------+---------+-----------+----------+-------+ Cephalic      Full                                          +----------+------------+---------+-----------+----------+-------+ Basilic       Full                                          +----------+------------+---------+-----------+----------+-------+  Summary:  Right: No evidence of deep vein thrombosis in the upper extremity. No evidence of superficial vein thrombosis in the upper extremity.  Left: No evidence of deep vein thrombosis in the upper extremity. No evidence of superficial vein thrombosis in the upper extremity.  *See table(s) above for measurements and observations.  Diagnosing physician: Delaney Fearing Electronically signed by Delaney Fearing on 09/04/2023 at 3:48:36 PM.    Final    CT ABDOMEN PELVIS WO CONTRAST Result Date: 08/28/2023 CLINICAL DATA:  Failure to thrive. 1st chemotherapy this week. Known lymphoma. * Tracking Code: BO * EXAM: CT ABDOMEN AND PELVIS WITHOUT CONTRAST TECHNIQUE: Multidetector CT imaging of the abdomen and pelvis was performed following the standard protocol without IV contrast. RADIATION DOSE REDUCTION: This exam was performed according to the departmental dose-optimization program which includes automated exposure control, adjustment of the mA and/or kV according to patient size and/or use of iterative reconstruction technique. COMPARISON:  08/09/2023 FINDINGS: Lower chest: Initial thickening at the lung bases likely due to subsegmental atelectasis and/or air trapping. Normal heart size without pericardial or pleural effusion. Hepatobiliary: Normal noncontrast appearance of the liver, gallbladder. No gross intrahepatic biliary duct dilatation. Pancreas: Normal, without mass or ductal dilatation. Spleen: Normal in size, without focal abnormality. Adrenals/Urinary Tract: Normal left adrenal gland. No left renal calculi. Mild left-sided caliectasis is unchanged. The dominant mass  centered in the right abdominal retroperitoneum is amorphous, ill-defined and difficult to on this noncontrast exam. Estimated at 12.6 by 9.6 cm on 38/2, relatively similar to 08/09/2023. This is encompasses and obscures the right adrenal gland right kidney. Amorphous soft tissue extends into the left-sided abdominal retroperitoneum including on 43/2, relatively similar. Soft tissue thickening is most apparent about the posterior bladder wall with suggestion of soft tissue fullness about the right bladder base including on 73/2. This is less masslike than on the prior exam. Stomach/Bowel: Tiny hiatal hernia. No gastric outlet obstruction. Normal colon and terminal ileum. Normal small bowel caliber. Vascular/Lymphatic: Aortic atherosclerosis. Prominent but not pathologically sized abdominal retroperitoneal nodes. Reproductive: Hysterectomy.  No adnexal mass. Other: Pelvic floor laxity. Small volume perihepatic ascites, decreased. Musculoskeletal: No acute osseous abnormality. IMPRESSION: 1. Limited exam secondary to lack of oral or IV contrast. 2. Grossly similar appearance of amorphous, ill-defined soft tissue density mass within the abdominopelvic right-sided  retroperitoneum. Extension or adjacent presumed lymphoma within the left-sided abdominal retroperitoneum. 3. No gross bowel obstruction or other acute complication. 4. Similar mild left-sided caliectasis, likely due to left abdominal retroperitoneal lymphoma. 5. Soft tissue thickening about the posterior bladder wall with decreased soft tissue fullness about the right bladder base. Suspicious for lymphomatous involvement. 6. Decrease in small volume perihepatic ascites 7. Incidental findings, including: Aortic Atherosclerosis (ICD10-I70.0). Tiny hiatal hernia. Electronically Signed   By: Lore Rode M.D.   On: 08/28/2023 16:51   DG Chest Portable 1 View Result Date: 08/28/2023 CLINICAL DATA:  Weakness, lightheadedness, dizziness, history of lymphoma EXAM:  PORTABLE CHEST 1 VIEW COMPARISON:  08/15/2023 FINDINGS: Single frontal view of the chest demonstrates right chest wall port via internal jugular approach, tip overlying superior vena cava. Cardiac silhouette is unremarkable. No acute airspace disease, effusion, or pneumothorax. No acute bony abnormalities. IMPRESSION: 1. No acute intrathoracic process. Electronically Signed   By: Bobbye Burrow M.D.   On: 08/28/2023 15:31   ECHOCARDIOGRAM COMPLETE Result Date: 08/22/2023    ECHOCARDIOGRAM REPORT   Patient Name:   ROZALYN OSLAND Date of Exam: 08/22/2023 Medical Rec #:  [id number]   Height:       59.0 in Accession #:    [id number]  Weight:       140.0 lb Date of Birth:  [birth year]   BSA:          1.585 m Patient Age:    83 years    BP:           114/63 mmHg Patient Gender: F           HR:           78 bpm. Exam Location:  Inpatient Procedure: 2D Echo, Color Doppler and Cardiac Doppler (Both Spectral and Color            Flow Doppler were utilized during procedure). Indications:    Chemo  History:        Patient has prior history of Echocardiogram examinations, most                 recent 03/22/2023.  Sonographer:    Andrena Bang Referring Phys: [id number] Frankie Israel  Sonographer Comments: Known apical displaced papillary muscle in LV. IMPRESSIONS  1. Left ventricular ejection fraction, by estimation, is 60 to 65%. The left ventricle has normal function. The left ventricle has no regional wall motion abnormalities. Left ventricular diastolic parameters are consistent with Grade I diastolic dysfunction (impaired relaxation). The average left ventricular global longitudinal strain is -17.5 %. The global longitudinal strain is normal.  2. Right ventricular systolic function is normal. The right ventricular size is normal.  3. The mitral valve is normal in structure. Mild to moderate mitral valve regurgitation. No evidence of mitral stenosis.  4. The aortic valve is grossly normal. Aortic valve regurgitation is trivial.  No aortic stenosis is present.  5. The inferior vena cava is normal in size with greater than 50% respiratory variability, suggesting right atrial pressure of 3 mmHg. Comparison(s): No significant change from prior study. Prior images reviewed side by side. FINDINGS  Left Ventricle: Left ventricular ejection fraction, by estimation, is 60 to 65%. The left ventricle has normal function. The left ventricle has no regional wall motion abnormalities. The average left ventricular global longitudinal strain is -17.5 %. Strain was performed and the global longitudinal strain is normal. The left ventricular internal cavity size was normal in size. There is no left  ventricular hypertrophy. Left ventricular diastolic parameters are consistent with Grade I diastolic dysfunction (impaired relaxation). Normal left ventricular filling pressure. Right Ventricle: The right ventricular size is normal. No increase in right ventricular wall thickness. Right ventricular systolic function is normal. Left Atrium: Left atrial size was normal in size. Right Atrium: Right atrial size was normal in size. Pericardium: There is no evidence of pericardial effusion. Mitral Valve: The mitral valve is normal in structure. Mild to moderate mitral valve regurgitation, with centrally-directed jet. No evidence of mitral valve stenosis. Tricuspid Valve: The tricuspid valve is normal in structure. Tricuspid valve regurgitation is mild . No evidence of tricuspid stenosis. Aortic Valve: The aortic valve is grossly normal. Aortic valve regurgitation is trivial. No aortic stenosis is present. Aortic valve mean gradient measures 4.0 mmHg. Aortic valve peak gradient measures 10.9 mmHg. Aortic valve area, by VTI measures 1.86 cm. Pulmonic Valve: The pulmonic valve was normal in structure. Pulmonic valve regurgitation is not visualized. No evidence of pulmonic stenosis. Aorta: The aortic root is normal in size and structure. Venous: The inferior vena cava is  normal in size with greater than 50% respiratory variability, suggesting right atrial pressure of 3 mmHg. IAS/Shunts: No atrial level shunt detected by color flow Doppler.  LEFT VENTRICLE PLAX 2D LVIDd:         3.90 cm     Diastology LVIDs:         2.20 cm     LV e' medial:    8.49 cm/s LV PW:         1.00 cm     LV E/e' medial:  7.9 LV IVS:        0.90 cm     LV e' lateral:   10.60 cm/s LVOT diam:     1.70 cm     LV E/e' lateral: 6.3 LV SV:         46 LV SV Index:   29          2D Longitudinal Strain LVOT Area:     2.27 cm    2D Strain GLS Avg:     -17.5 %  LV Volumes (MOD) LV vol d, MOD A2C: 49.6 ml LV vol d, MOD A4C: 54.7 ml LV vol s, MOD A2C: 16.9 ml LV vol s, MOD A4C: 24.9 ml LV SV MOD A2C:     32.7 ml LV SV MOD A4C:     54.7 ml LV SV MOD BP:      32.7 ml RIGHT VENTRICLE RV S prime:     16.90 cm/s TAPSE (M-mode): 2.0 cm LEFT ATRIUM             Index LA diam:        3.10 cm 1.96 cm/m LA Vol (A2C):   22.3 ml 14.07 ml/m LA Vol (A4C):   24.7 ml 15.58 ml/m LA Biplane Vol: 24.1 ml 15.21 ml/m  AORTIC VALVE AV Area (Vmax):    1.69 cm AV Area (Vmean):   1.85 cm AV Area (VTI):     1.86 cm AV Vmax:           165.00 cm/s AV Vmean:          86.200 cm/s AV VTI:            0.247 m AV Peak Grad:      10.9 mmHg AV Mean Grad:      4.0 mmHg LVOT Vmax:         123.00 cm/s LVOT  Vmean:        70.300 cm/s LVOT VTI:          0.202 m LVOT/AV VTI ratio: 0.82 MITRAL VALVE               TRICUSPID VALVE MV Area (PHT): 4.33 cm    TR Peak grad:   33.4 mmHg MV Decel Time: 175 msec    TR Vmax:        289.00 cm/s MR Peak grad: 147.4 mmHg MR Mean grad: 111.0 mmHg   SHUNTS MR Vmax:      607.00 cm/s  Systemic VTI:  0.20 m MR Vmean:     500.0 cm/s   Systemic Diam: 1.70 cm MV E velocity: 66.70 cm/s MV A velocity: 99.00 cm/s MV E/A ratio:  0.67 Mihai Croitoru MD Electronically signed by Luana Rumple MD Signature Date/Time: 08/22/2023/2:33:18 PM    Final     Assessment/Plan  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 Plan to administer a reduced dosage in the next treatment to monitor tolerance. - Follow up with oncologist for chemotherapy management - Monitor renal function closely  Peripheral Edema Persistent leg swelling likely due to fluid retention, with contributions from congestive heart failure and chronic kidney disease. Compression stockings and leg elevation recommended. Advised to monitor weight for sudden increases. Torsemide  held during hospitalization due to kidney issues. Desonide prescribed as needed for swelling until nephrologist approval for torsemide  resumption. - Advise wearing compression stockings daily - Elevate legs when seated - Monitor weight regularly - Use desonide as needed for swelling, pending nephrologist approval for torsemide  - Avoid salty foods to prevent fluid retention  Congestive Heart Failure Congestive heart failure with diastolic dysfunction. Fluid management complicated by kidney issues, with torsemide  held during hospitalization. Echocardiogram showed ejection fraction of 60-65%. - Consult nephrologist regarding resumption of torsemide  - Monitor for signs of fluid overload  Chronic Kidney Disease Stage 3 Stage 3 chronic kidney disease exacerbated by acute renal failure during hospitalization. Nephrologist involved in ongoing management. - Continue follow-up with nephrologist - Monitor renal function  Hypertension Hypertension managed with metoprolol. Blood pressure well-controlled at 124/60 mmHg. - Continue metoprolol twice daily  Anemia Drop in hemoglobin to 6.6 g/dL during hospitalization, requiring a blood transfusion. Continues to feel tired and weak. 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 On entecavir  therapy. Follow-up and lab work  scheduled for July. - Continue entecavir  - Follow up with infectious disease specialist - Recheck lab work in July  Dysphagia Difficulty swallowing evaluated by a gastroenterologist. A feeding tube was placed, and dilated gastritis noted without bleeding or ulcers. Protonix  and Carafate  prescribed for management. - Continue Protonix  40 mg twice daily for 8 weeks, then once daily - Continue Carafate  1 g twice daily for 4 weeks - Follow up with gastroenterologist  Pressure Ulcers Two pressure ulcers on the bottom, which are healing. Advised to avoid prolonged sitting and change positions frequently. Desitin cream recommended for application. - Apply Desitin cream to pressure ulcers - Encourage frequent position changes - Monitor for signs of infection or worsening  Follow-up Multiple follow-up appointments scheduled with various specialists to manage complex medical conditions. - Follow up with oncologist tomorrow - Follow up with nephrologist - Follow up  with infectious disease specialist in July - Next appointment  with primary care in August   Family/ staff Communication: Reviewed plan of care with patient verbalized understanding   Labs/tests ordered: None   Next Appointment : Return if symptoms worsen or fail to improve.   Spent 30 minutes of Face to face and non-face to face with patient  >50% time spent counseling; reviewing medical record; tests; labs; documentation and developing future plan of care.   Estil Heman, NP

## 2023-09-21 NOTE — Progress Notes (Signed)
 CRITICAL VALUE STICKER  CRITICAL VALUE: Hgb 6.2  DATE & TIME NOTIFIED: 09/21/23 2:33  MD NOTIFIED: Salomon Cree  TIME OF NOTIFICATION: 2:35  RESPONSE:  TBD/ possible blood

## 2023-09-21 NOTE — Progress Notes (Incomplete)
 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    Prednisone  60 mg daily in the January 2022 to treat her hemolytic anemia.  This was tapered off after complete hematological response.  HISTORY OF PRESENTING ILLNESS:   Jacqueline Hancock is a wonderful 83 y.o. female who is here for continued evaluation and management of NHL, unspecified body region. Patient was following up with Dr. Dirk Fredericks.   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 BM Bx Cyclin D1 IHC +ve byt FISH neg . She has presented with splenomegaly, anemia, and lymphocytosis. 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   Patient's current treatment is Imbruvica  280 mg. She was started on Imbruvica  560 mg on January 2022, which was then reduced to 280 mg in July 2023.   Patient was last seen by Dr. Dirk Fredericks on 02/10/2022 and she was doing well overall.   Patient reports she has been doing well overall since our last visit. She does complains of itchy skin rashes, mostly around the face and head, for around 2-3 weeks ago. Patient notes that she was started couple of new medications from other physicians, but she is unsure of the names. Patient notes that she was started on oxycodone around 3-4 weeks ago. She discontinued oxycodone.   She notes that  there is redness around her face, which is not new.   Patient is currently taking Imbruvica  280 mg as prescribed and has been tolerating it well without any severe toxicities.   She is currently taking Iron supplement, calcium supplement, and Vitamin-D supplement.   She denies fever, chills, night sweats, unexpected weight loss, back pain, abdominal pain, chest pain, or leg swelling. Patient does complain of occasional abdominal pain.   Patient notes she had a surgery near her right neck/ear area to remove an enlarged lymph node couple years ago. She complains of mild occasional pain near the site.  INTERVAL HISTORY:  Jacqueline Hancock is a wonderful 83 y.o. 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   Patient was last seen by me on 09/12/2023 and complained of abdominal tightness, SOB sometimes when lying down, leg swelling, and lesion in left lower extremity.  She presents today for toxicity check following cycle 2 of her R-miniCHOP treatment with Rituxan .   She presents in a wheelchair and is accompanied by her son during today's visit.   Patient reports that her legs were weeping and her legs were wrapped by home care nurse. She reports that she takes Lasix  sometimes, though not daily due to concern for urinary frequency.  She complains of poor taste. Patient notes that she has been eating more than usual.   She reports that her abdomen has been less tight and her abdominal pain has improved.  She denies any fever, chills, night sweats, syncope, near-syncope sensation, chest pain, or issues passing urine.  She reports that she has not been very well-hydrated.   She  has been moving her bowels well.   She notes that she is unable to swallow meat, but has no issues swallowing solid foods otherwise.   She complains that when she is sleeping, she has difficulty breathing. She notes that she has a hospital bed and is able to raise the head-end of the bed.    Patient denies any new lumps/bumps.   MEDICAL HISTORY:  Past Medical History:  Diagnosis Date   CLL (chronic lymphocytic leukemia) (HCC)    Dyspnea 2021   with exersion    Early cataracts, bilateral    Elevated hemoglobin A1c    Fibroids    GERD (gastroesophageal reflux disease)    Hyperlipidemia    Hypertension    Lymphoma (HCC)    Osteopenia     SURGICAL HISTORY: Past Surgical History:  Procedure Laterality Date   ABDOMINAL HYSTERECTOMY     APPENDECTOMY     BALLOON DILATION N/A 08/31/2023   Procedure: BALLOON DILATION;  Surgeon: Lajuan Pila, MD;  Location: [facility];  Service: Gastroenterology;  Laterality: N/A;   CATARACT EXTRACTION, BILATERAL Bilateral 09/2017   Dr. 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   TONSILLECTOMY AND ADENOIDECTOMY      SOCIAL HISTORY: Social History   Socioeconomic History   Marital status: Widowed    Spouse name: Not on file   Number of children: 3   Years of education: Not on file   Highest education level: Not on file  Occupational History   Occupation: retired  Tobacco Use   Smoking status: Never   Smokeless tobacco: Never  Vaping Use   Vaping status: Never Used  Substance and Sexual Activity   Alcohol use: No   Drug use: Never   Sexual activity: Not on file  Other Topics Concern   Not on file  Social History Narrative   Not on file   Social Drivers of Health   Financial Resource Strain: Not on file  Food Insecurity: No Food Insecurity (08/29/2023)   Hunger Vital Sign    Worried About Running Out of Food in the Last Year: Never true    Ran Out of Food in the Last Year: Never true  Transportation Needs: No Transportation Needs (08/29/2023)   PRAPARE - Therapist, art (Medical): No    Lack of Transportation (Non-Medical): No  Physical Activity: Not on file  Stress: Not on file  Social Connections: Moderately Integrated (08/29/2023)   Social Connection and Isolation Panel    Frequency of Communication with Friends and Family: More than three times a week    Frequency of Social Gatherings with Friends and Family: Three times a week    Attends Religious Services: More than 4 times per year    Active Member of Clubs or Organizations: Yes    Attends Banker Meetings: More than 4 times per year    Marital Status: Widowed  Intimate Partner Violence: Not At Risk (08/29/2023)   Humiliation, Afraid, Rape, and Kick questionnaire    Fear of Current or Ex-Partner: No    Emotionally Abused: No    Physically Abused: No    Sexually Abused: No    FAMILY HISTORY: Family History  Problem Relation Age of Onset   Leukemia Mother  Hypertension Father    Diabetes Sister    Heart disease Brother    Diabetes Brother    Diabetes Brother    Hypertension Son    Heart disease Son     ALLERGIES:  is allergic to nexium [esomeprazole magnesium] and peanut butter flavoring agent (non-screening).  MEDICATIONS:  Current Outpatient Medications  Medication Sig Dispense Refill   B Complex Vitamins (VITAMIN B-COMPLEX PO) Take 1 tablet by mouth daily.     entecavir  (BARACLUDE ) 0.5 MG tablet Take 1 tablet (0.5 mg total) by mouth every 3 (three) days. 10 tablet 0   loratadine (CLARITIN) 10 MG tablet Take 10 mg by mouth daily.     midodrine  (PROAMATINE ) 10 MG tablet Take 1 tablet (10 mg total) by mouth 2 (two) times daily with a meal. 60 tablet 0   pantoprazole  (PROTONIX ) 40 MG tablet Take 1 tablet (40 mg total) by mouth 2 (two) times daily for 56 days, THEN 1 tablet (40 mg total) daily. 142 tablet 0   polyethylene glycol (MIRALAX  / GLYCOLAX ) 17 g packet Take 17 g by mouth daily as needed for mild constipation or moderate constipation. Also  available OTC 30 each 0   prochlorperazine  (COMPAZINE ) 10 MG tablet Take 1 tablet (10 mg total) by mouth every 6 (six) hours as needed for nausea or vomiting. 30 tablet 6   senna-docusate (SENOKOT-S) 8.6-50 MG tablet Take 2 tablets by mouth at bedtime. For constipation 60 tablet 1   sucralfate  (CARAFATE ) 1 GM/10ML suspension Take 10 mLs (1 g total) by mouth 2 (two) times daily for 28 days. 560 mL 0   torsemide  (DEMADEX ) 20 MG tablet Take 1 tablet (20 mg total) by mouth daily as needed. Take for weight gain > 3 lbs from baseline 140 lbs  or shortness of breath 90 tablet 1   traMADol  (ULTRAM ) 50 MG tablet Take 1 tablet (50 mg total) by mouth every 8 (eight) hours as needed for moderate pain (pain score 4-6) or severe pain (pain score 7-10) (pain). 20 tablet 0   No current facility-administered medications for this visit.    REVIEW OF SYSTEMS:    10 Point review of Systems was done is negative except as noted above.   PHYSICAL EXAMINATION: ECOG PERFORMANCE STATUS: 2 - Symptomatic, <50% confined to bed  . There were no vitals filed for this visit.  There were no vitals filed for this visit. .There is no height or weight on file to calculate BMI.   GENERAL:alert, in no acute distress and comfortable SKIN: no acute rashes, no significant lesions EYES: conjunctiva are pink and non-injected, sclera anicteric OROPHARYNX: MMM, no exudates, no oropharyngeal erythema or ulceration NECK: supple, no JVD LYMPH:  no palpable lymphadenopathy in the cervical, axillary or inguinal regions LUNGS: clear to auscultation b/l with normal respiratory effort HEART: regular rate & rhythm ABDOMEN:  normoactive bowel sounds , non tender, not distended. Extremity: no pedal edema PSYCH: alert & oriented x 3 with fluent speech NEURO: no focal motor/sensory deficits   LABORATORY DATA:  I have reviewed the data as listed  .    Latest Ref Rng & Units 09/12/2023   12:13 PM 09/05/2023    1:46 AM 09/04/2023     2:26 AM  CBC  WBC 4.0 - 10.5 K/uL 17.1  14.5  14.1   Hemoglobin 12.0 - 15.0 g/dL 9.0  8.5  9.1   Hematocrit 36.0 - 46.0 % 29.4  29.0  30.9   Platelets 150 - 400 K/uL  189  127  131    .    Latest Ref Rng & Units 09/12/2023   12:13 PM 09/05/2023    1:46 AM 09/04/2023    2:26 AM  CMP  Glucose 70 - 99 mg/dL 85  82  82   BUN 8 - 23 mg/dL 17  44  53   Creatinine 0.44 - 1.00 mg/dL 6.04  5.40  9.81   Sodium 135 - 145 mmol/L 141  138  141   Potassium 3.5 - 5.1 mmol/L 4.0  4.5  4.4   Chloride 98 - 111 mmol/L 108  109  107   CO2 22 - 32 mmol/L 27  22  23    Calcium 8.9 - 10.3 mg/dL 8.1  7.9  8.0   Total Protein 6.5 - 8.1 g/dL 5.1  4.6    Total Bilirubin 0.0 - 1.2 mg/dL 0.6  0.8    Alkaline Phos 38 - 126 U/L 121  129    AST 15 - 41 U/L 14  28    ALT 0 - 44 U/L 8  14     . Lab Results  Component Value Date   LDH 624 (H) 08/28/2023   Component     Latest Ref Rng 08/18/2023  HBV DNA SERPL PCR-ACNC     IU/mL 110   HBV DNA SERPL PCR-LOG IU     log10 IU/mL 2.041   Test Info: Comment   Hepatitis A AB,Total     NON-REACTIVE  REACTIVE !   HCV Ab     NON REACTIVE  NON REACTIVE   HEP B CORE AB     Negative  Positive !   Hepatitis B Surface Ag     NON REACTIVE  Reactive !   Uric Acid, Serum     2.5 - 7.1 mg/dL     Legend: ! Abnormal   PATHOLOGY Surgical Pathology CASE: [id number] [phone] PATIENT: Ethleen Holm Bone Marrow Report     Clinical History: lymphocytosis, possible lymphoma     DIAGNOSIS:  BONE MARROW, ASPIRATE, CLOT, CORE: -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  The latter is primarily seen by flow cytometry.  The findings favor mantle cell  lymphoma but correlation with cytogenetic and FISH studies is recommended.  MICROSCOPIC DESCRIPTION:  PERIPHERAL BLOOD SMEAR: The red blood cells display prominent anisocytosis with macrocytic and normocytic cells.  There is mild to moderate poikilocytosis with teardrop cells, elliptocytes.  There is moderate polychromasia.  The white blood cells are increased in number with lymphocytosis characterized by predominance of small to medium sized lymphoid cells displaying high nuclear cytoplasmic ratio, round to irregular nuclei, coarse chromatin and small to inconspicuous nucleoli. The platelets are normal in number.     RADIOGRAPHIC STUDIES: I have personally reviewed the radiological images as listed and agreed with the findings in the report. VAS US  UPPER EXTREMITY VENOUS DUPLEX Result Date: 09/04/2023 UPPER VENOUS STUDY  Patient Name:  NAIKA NOTO  Date of Exam:   09/03/2023 Medical Rec #: [id number]    Accession #:    [id number] Date of Birth: 06-Jul-1940    Patient Gender: F Patient Age:   50 years Exam Location:  [facility] Procedure:      VAS US  UPPER EXTREMITY VENOUS DUPLEX  Referring Phys: STEPHEN CHIU --------------------------------------------------------------------------------  Indications: Swelling Risk Factors: Cancer. Comparison Study: No prior studies. Performing Technologist: Lerry Ransom RVT  Examination Guidelines: A complete evaluation includes B-mode imaging, spectral Doppler, color Doppler, and power Doppler as needed of all accessible portions of each vessel. Bilateral testing is considered an integral part of a complete examination. Limited examinations for reoccurring indications may be performed as noted.  Right Findings: +----------+------------+---------+-----------+----------+-------+ RIGHT     CompressiblePhasicitySpontaneousPropertiesSummary +----------+------------+---------+-----------+----------+-------+ IJV           Full       Yes       Yes                       +----------+------------+---------+-----------+----------+-------+ Subclavian               Yes       Yes                      +----------+------------+---------+-----------+----------+-------+ Axillary      Full       Yes       Yes                      +----------+------------+---------+-----------+----------+-------+ Brachial      Full                                          +----------+------------+---------+-----------+----------+-------+ Radial        Full                                          +----------+------------+---------+-----------+----------+-------+ Ulnar         Full                                          +----------+------------+---------+-----------+----------+-------+ Cephalic      Full                                          +----------+------------+---------+-----------+----------+-------+ Basilic       Full                                          +----------+------------+---------+-----------+----------+-------+  Left Findings: +----------+------------+---------+-----------+----------+-------+ LEFT      CompressiblePhasicitySpontaneousPropertiesSummary +----------+------------+---------+-----------+----------+-------+ IJV           Full       Yes       Yes                      +----------+------------+---------+-----------+----------+-------+ Subclavian               Yes       Yes                      +----------+------------+---------+-----------+----------+-------+ Axillary      Full       Yes       Yes                      +----------+------------+---------+-----------+----------+-------+  Brachial      Full                                          +----------+------------+---------+-----------+----------+-------+ Radial        Full                                          +----------+------------+---------+-----------+----------+-------+ Ulnar         Full                                           +----------+------------+---------+-----------+----------+-------+ Cephalic      Full                                          +----------+------------+---------+-----------+----------+-------+ Basilic       Full                                          +----------+------------+---------+-----------+----------+-------+  Summary:  Right: No evidence of deep vein thrombosis in the upper extremity. No evidence of superficial vein thrombosis in the upper extremity.  Left: No evidence of deep vein thrombosis in the upper extremity. No evidence of superficial vein thrombosis in the upper extremity.  *See table(s) above for measurements and observations.  Diagnosing physician: Delaney Fearing Electronically signed by Delaney Fearing on 09/04/2023 at 3:48:36 PM.    Final    CT ABDOMEN PELVIS WO CONTRAST Result Date: 08/28/2023 CLINICAL DATA:  Failure to thrive. 1st chemotherapy this week. Known lymphoma. * Tracking Code: BO * EXAM: CT ABDOMEN AND PELVIS WITHOUT CONTRAST TECHNIQUE: Multidetector CT imaging of the abdomen and pelvis was performed following the standard protocol without IV contrast. RADIATION DOSE REDUCTION: This exam was performed according to the departmental dose-optimization program which includes automated exposure control, adjustment of the mA and/or kV according to patient size and/or use of iterative reconstruction technique. COMPARISON:  08/09/2023 FINDINGS: Lower chest: Initial thickening at the lung bases likely due to subsegmental atelectasis and/or air trapping. Normal heart size without pericardial or pleural effusion. Hepatobiliary: Normal noncontrast appearance of the liver, gallbladder. No gross intrahepatic biliary duct dilatation. Pancreas: Normal, without mass or ductal dilatation. Spleen: Normal in size, without focal abnormality. Adrenals/Urinary Tract: Normal left adrenal gland. No left renal calculi. Mild left-sided caliectasis is unchanged. The  dominant mass centered in the right abdominal retroperitoneum is amorphous, ill-defined and difficult to on this noncontrast exam. Estimated at 12.6 by 9.6 cm on 38/2, relatively similar to 08/09/2023. This is encompasses and obscures the right adrenal gland right kidney. Amorphous soft tissue extends into the left-sided abdominal retroperitoneum including on 43/2, relatively similar. Soft tissue thickening is most apparent about the posterior bladder wall with suggestion of soft tissue fullness about the right bladder base including on 73/2. This is less masslike than on the prior exam. Stomach/Bowel: Tiny hiatal hernia. No gastric outlet obstruction. Normal colon and terminal ileum. Normal small bowel caliber. Vascular/Lymphatic: Aortic atherosclerosis. Prominent but not pathologically sized abdominal  retroperitoneal nodes. Reproductive: Hysterectomy.  No adnexal mass. Other: Pelvic floor laxity. Small volume perihepatic ascites, decreased. Musculoskeletal: No acute osseous abnormality. IMPRESSION: 1. Limited exam secondary to lack of oral or IV contrast. 2. Grossly similar appearance of amorphous, ill-defined soft tissue density mass within the abdominopelvic right-sided retroperitoneum. Extension or adjacent presumed lymphoma within the left-sided abdominal retroperitoneum. 3. No gross bowel obstruction or other acute complication. 4. Similar mild left-sided caliectasis, likely due to left abdominal retroperitoneal lymphoma. 5. Soft tissue thickening about the posterior bladder wall with decreased soft tissue fullness about the right bladder base. Suspicious for lymphomatous involvement. 6. Decrease in small volume perihepatic ascites 7. Incidental findings, including: Aortic Atherosclerosis (ICD10-I70.0). Tiny hiatal hernia. Electronically Signed   By: Lore Rode M.D.   On: 08/28/2023 16:51   DG Chest Portable 1 View Result Date: 08/28/2023 CLINICAL DATA:  Weakness, lightheadedness, dizziness, history of  lymphoma EXAM: PORTABLE CHEST 1 VIEW COMPARISON:  08/15/2023 FINDINGS: Single frontal view of the chest demonstrates right chest wall port via internal jugular approach, tip overlying superior vena cava. Cardiac silhouette is unremarkable. No acute airspace disease, effusion, or pneumothorax. No acute bony abnormalities. IMPRESSION: 1. No acute intrathoracic process. Electronically Signed   By: Bobbye Burrow M.D.   On: 08/28/2023 15:31   ECHOCARDIOGRAM COMPLETE Result Date: 08/22/2023    ECHOCARDIOGRAM REPORT   Patient Name:   TEEA DUCEY Date of Exam: 08/22/2023 Medical Rec #:  [id number]   Height:       59.0 in Accession #:    [id number]  Weight:       140.0 lb Date of Birth:  1941/01/11   BSA:          1.585 m Patient Age:    83 years    BP:           114/63 mmHg Patient Gender: F           HR:           78 bpm. Exam Location:  Inpatient Procedure: 2D Echo, Color Doppler and Cardiac Doppler (Both Spectral and Color            Flow Doppler were utilized during procedure). Indications:    Chemo  History:        Patient has prior history of Echocardiogram examinations, most                 recent 03/22/2023.  Sonographer:    Andrena Bang Referring Phys: [id number] Frankie Israel  Sonographer Comments: Known apical displaced papillary muscle in LV. IMPRESSIONS  1. Left ventricular ejection fraction, by estimation, is 60 to 65%. The left ventricle has normal function. The left ventricle has no regional wall motion abnormalities. Left ventricular diastolic parameters are consistent with Grade I diastolic dysfunction (impaired relaxation). The average left ventricular global longitudinal strain is -17.5 %. The global longitudinal strain is normal.  2. Right ventricular systolic function is normal. The right ventricular size is normal.  3. The mitral valve is normal in structure. Mild to moderate mitral valve regurgitation. No evidence of mitral stenosis.  4. The aortic valve is grossly normal. Aortic valve  regurgitation is trivial. No aortic stenosis is present.  5. The inferior vena cava is normal in size with greater than 50% respiratory variability, suggesting right atrial pressure of 3 mmHg. Comparison(s): No significant change from prior study. Prior images reviewed side by side. FINDINGS  Left Ventricle: Left ventricular ejection fraction, by estimation, is 60 to 65%.  The left ventricle has normal function. The left ventricle has no regional wall motion abnormalities. The average left ventricular global longitudinal strain is -17.5 %. Strain was performed and the global longitudinal strain is normal. The left ventricular internal cavity size was normal in size. There is no left ventricular hypertrophy. Left ventricular diastolic parameters are consistent with Grade I diastolic dysfunction (impaired relaxation). Normal left ventricular filling pressure. Right Ventricle: The right ventricular size is normal. No increase in right ventricular wall thickness. Right ventricular systolic function is normal. Left Atrium: Left atrial size was normal in size. Right Atrium: Right atrial size was normal in size. Pericardium: There is no evidence of pericardial effusion. Mitral Valve: The mitral valve is normal in structure. Mild to moderate mitral valve regurgitation, with centrally-directed jet. No evidence of mitral valve stenosis. Tricuspid Valve: The tricuspid valve is normal in structure. Tricuspid valve regurgitation is mild . No evidence of tricuspid stenosis. Aortic Valve: The aortic valve is grossly normal. Aortic valve regurgitation is trivial. No aortic stenosis is present. Aortic valve mean gradient measures 4.0 mmHg. Aortic valve peak gradient measures 10.9 mmHg. Aortic valve area, by VTI measures 1.86 cm. Pulmonic Valve: The pulmonic valve was normal in structure. Pulmonic valve regurgitation is not visualized. No evidence of pulmonic stenosis. Aorta: The aortic root is normal in size and structure. Venous:  The inferior vena cava is normal in size with greater than 50% respiratory variability, suggesting right atrial pressure of 3 mmHg. IAS/Shunts: No atrial level shunt detected by color flow Doppler.  LEFT VENTRICLE PLAX 2D LVIDd:         3.90 cm     Diastology LVIDs:         2.20 cm     LV e' medial:    8.49 cm/s LV PW:         1.00 cm     LV E/e' medial:  7.9 LV IVS:        0.90 cm     LV e' lateral:   10.60 cm/s LVOT diam:     1.70 cm     LV E/e' lateral: 6.3 LV SV:         46 LV SV Index:   29          2D Longitudinal Strain LVOT Area:     2.27 cm    2D Strain GLS Avg:     -17.5 %  LV Volumes (MOD) LV vol d, MOD A2C: 49.6 ml LV vol d, MOD A4C: 54.7 ml LV vol s, MOD A2C: 16.9 ml LV vol s, MOD A4C: 24.9 ml LV SV MOD A2C:     32.7 ml LV SV MOD A4C:     54.7 ml LV SV MOD BP:      32.7 ml RIGHT VENTRICLE RV S prime:     16.90 cm/s TAPSE (M-mode): 2.0 cm LEFT ATRIUM             Index LA diam:        3.10 cm 1.96 cm/m LA Vol (A2C):   22.3 ml 14.07 ml/m LA Vol (A4C):   24.7 ml 15.58 ml/m LA Biplane Vol: 24.1 ml 15.21 ml/m  AORTIC VALVE AV Area (Vmax):    1.69 cm AV Area (Vmean):   1.85 cm AV Area (VTI):     1.86 cm AV Vmax:           165.00 cm/s AV Vmean:          86.200  cm/s AV VTI:            0.247 m AV Peak Grad:      10.9 mmHg AV Mean Grad:      4.0 mmHg LVOT Vmax:         123.00 cm/s LVOT Vmean:        70.300 cm/s LVOT VTI:          0.202 m LVOT/AV VTI ratio: 0.82 MITRAL VALVE               TRICUSPID VALVE MV Area (PHT): 4.33 cm    TR Peak grad:   33.4 mmHg MV Decel Time: 175 msec    TR Vmax:        289.00 cm/s MR Peak grad: 147.4 mmHg MR Mean grad: 111.0 mmHg   SHUNTS MR Vmax:      607.00 cm/s  Systemic VTI:  0.20 m MR Vmean:     500.0 cm/s   Systemic Diam: 1.70 cm MV E velocity: 66.70 cm/s MV A velocity: 99.00 cm/s MV E/A ratio:  0.67 Mihai Croitoru MD Electronically signed by Luana Rumple MD Signature Date/Time: 08/22/2023/2:33:18 PM    Final     ASSESSMENT & PLAN:   83 y.o. woman with:   1.  CLL  - CD20+ CD5+ve lymphoproliferative disorder presented with hemolytic anemia and splenomegaly in December 2021. Though discussed with patient that FISH was neg and so Mantle cell lymphoma not confirm. Findings more consistent with CLL.  2.  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  3.  High risk of tumor lysis syndrome  4.  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  5) Hypercalcemia - ? From dehydration vs recurrent lymphoma  6) hepatitis B chronic active disease  PLAN:  -Discussed lab results on 09/21/23 in detail with patient. CBC showed WBC of 2.5K, hemoglobin of 6.2, and platelets of 147K. -she is very anemic, which is partly from her CKD, and partly from chemotherapy -platelets are near-normal -will set up patient for blood transfusion with 2 units PRBCs -there is some slight leukopenia, though not significant -creatinine improved from 4 to 1.74 today -albumin  improved to 3, which suggests that she is eating better -potassium normal -recommend patient to stay well-hydrated, drinking at least 2L of water daily -educated patient that non-caffeinated, non-carbonated, and non-alcoholic liquids would count towards hydration. -continue to eat as well as she can -recommend using Boost/Ensure nutritional supplement to optimize nutrition -patient received Rituxan  with cycle 2 of treatment and denies any major toxicities -continue R-mini CHOP treatment  regimen as scheduled at current doses -discussed that I am hopeful that as her tumor shrinks, it will take pressure off of the kidneys -discussed that generally, if there is concern for anemia, we would transfuse her as needed, generally without adjusting treatment dose. Discussed that there may be a role to adjust treatment if WBCs or PLT are very low, which is not a concern at this time.   -Will plan to repeat her PET scan after her next treatment -her abdomen appears to be softer on palpation -recommend using pillows to prop her up while lying down or raise head-end of bed given that abdominal tightness can cause more pressure when laying flat and cause breathing issues.  -continue lasix  to improve leg swelling -recommend keeping the legs elevated as much as she can during the day  to improve leg swelling -recommend having her whole legs ace wrapped with home nurse to keep her leg swelling down -She is on allopurinol  for TLS prophylaxis -continue to follow-up with infectious disease for management of her chronic active hepatitis B  FOLLOW-UP: Transfusion of 2 units of PRBC ASAP RTC with C3 of R-miniCHOP as per orders with portflush, labs and MD visit  The total time spent in the appointment was *** minutes* .  All of the patient's questions were answered with apparent satisfaction. The patient knows to call the clinic with any problems, questions or concerns.   Jacquelyn Matt MD MS AAHIVMS [facility] [address] [facility] Hematology/Oncology Physician [facility]  .*Total Encounter Time as defined by the Centers for Medicare and Medicaid Services includes, in addition to the face-to-face time of a patient visit (documented in the note above) non-face-to-face time: obtaining and reviewing outside history, ordering and reviewing medications, tests or procedures, care coordination (communications with other health care professionals or caregivers) and documentation in the medical record.    I,Mitra Faeizi,acting as a Neurosurgeon for Jacquelyn Matt, MD.,have documented all relevant documentation on the behalf of Jacquelyn Matt, MD,as directed by  Jacquelyn Matt, MD while in the presence of Jacquelyn Matt, MD.  ***

## 2023-09-22 ENCOUNTER — Inpatient Hospital Stay: Admitting: Adult Health

## 2023-09-23 ENCOUNTER — Inpatient Hospital Stay

## 2023-09-23 VITALS — BP 161/71 | HR 79 | Temp 98.0°F | Resp 16

## 2023-09-23 DIAGNOSIS — C8338 Diffuse large B-cell lymphoma, lymph nodes of multiple sites: Secondary | ICD-10-CM

## 2023-09-23 DIAGNOSIS — C8333 Diffuse large B-cell lymphoma, intra-abdominal lymph nodes: Secondary | ICD-10-CM | POA: Diagnosis not present

## 2023-09-23 DIAGNOSIS — Z95828 Presence of other vascular implants and grafts: Secondary | ICD-10-CM

## 2023-09-23 MED ORDER — METHYLPREDNISOLONE SODIUM SUCC 40 MG IJ SOLR
40.0000 mg | Freq: Once | INTRAMUSCULAR | Status: AC
  Administered 2023-09-23: 40 mg via INTRAVENOUS
  Filled 2023-09-23: qty 1

## 2023-09-23 MED ORDER — ACETAMINOPHEN 325 MG PO TABS
650.0000 mg | ORAL_TABLET | Freq: Once | ORAL | Status: AC
  Administered 2023-09-23: 650 mg via ORAL
  Filled 2023-09-23: qty 2

## 2023-09-23 MED ORDER — SODIUM CHLORIDE 0.9% IV SOLUTION
250.0000 mL | INTRAVENOUS | Status: DC
  Administered 2023-09-23: 100 mL via INTRAVENOUS

## 2023-09-23 MED ORDER — SODIUM CHLORIDE 0.9% FLUSH
10.0000 mL | Freq: Once | INTRAVENOUS | Status: AC
  Administered 2023-09-23: 10 mL

## 2023-09-23 MED ORDER — HEPARIN SOD (PORK) LOCK FLUSH 100 UNIT/ML IV SOLN
250.0000 [IU] | Freq: Once | INTRAVENOUS | Status: AC
  Administered 2023-09-23: 250 [IU]

## 2023-09-23 NOTE — Patient Instructions (Signed)
 Blood Transfusion, Adult A blood transfusion is a procedure in which you receive blood through an IV tube. You may need this procedure because of: A bleeding disorder. An illness. An injury. A surgery. The blood may come from someone else (a donor). You may also be able to donate blood for yourself before a surgery. The blood given in a transfusion may be made up of different types of cells. You may get: Red blood cells. These carry oxygen to the cells in the body. Platelets. These help your blood to clot. Plasma. This is the liquid part of your blood. It carries proteins and other substances through the body. White blood cells. These help you fight infections. If you have a clotting disorder, you may also get other types of blood products. Depending on the type of blood product, this procedure may take 1-4 hours to complete. Tell your doctor about: Any bleeding problems you have. Any reactions you have had during a blood transfusion in the past. Any allergies you have. All medicines you are taking, including vitamins, herbs, eye drops, creams, and over-the-counter medicines. Any surgeries you have had. Any medical conditions you have. Whether you are pregnant or may be pregnant. What are the risks? Talk with your health care provider about risks. The most common problems include: A mild allergic reaction. This includes red, swollen areas of skin (hives) and itching. Fever or chills. This may be the body's response to new blood cells received. This may happen during or up to 4 hours after the transfusion. More serious problems may include: A serious allergic reaction. This includes breathing trouble or swelling around the face and lips. Too much fluid in the lungs. This may cause breathing problems. Lung injury. This causes breathing trouble and low oxygen in the blood. This can happen within hours of the transfusion or days later. Too much iron. This can happen after getting many blood  transfusions over a period of time. An infection or virus passed through the blood. This is rare. Donated blood is carefully tested before it is given. Your body's defense system (immune system) trying to attack the new blood cells. This is rare. Symptoms may include fever, chills, nausea, low blood pressure, and low back or chest pain. Donated cells attacking healthy tissues. This is rare. What happens before the procedure? You will have a blood test to find out your blood type. The test also finds out what type of blood your body will accept and matches it to the donor type. If you are going to have a planned surgery, you may be able to donate your own blood. This may be done in case you need a transfusion. You will have your temperature, blood pressure, and pulse checked. You may receive medicine to help prevent an allergic reaction. This may be done if you have had a reaction to a transfusion before. This medicine may be given to you by mouth or through an IV tube. What happens during the procedure?  An IV tube will be put into one of your veins. The bag of blood will be attached to your IV tube. Then, the blood will enter through your vein. Your temperature, blood pressure, and pulse will be checked often. This is done to find early signs of a transfusion reaction. Tell your nurse right away if you have any of these symptoms: Shortness of breath or trouble breathing. Chest or back pain. Fever or chills. Red, swollen areas of skin or itching. If you have any signs  or symptoms of a reaction, your transfusion will be stopped. You may also be given medicine. When the transfusion is finished, your IV tube will be taken out. Pressure may be put on the IV site for a few minutes. A bandage (dressing) will be put on the IV site. The procedure may vary among doctors and hospitals. What happens after the procedure? You will be monitored until you leave the hospital or clinic. This includes  checking your temperature, blood pressure, pulse, breathing rate, and blood oxygen level. Your blood may be tested to see how you have responded to the transfusion. You may be warmed with fluids or blankets. This is done to keep the temperature of your body normal. If you have your procedure in an outpatient setting, you will be told whom to contact to report any reactions. Where to find more information Visit the [employer]: redcross.org Summary A blood transfusion is a procedure in which you receive blood through an IV tube. The blood you are given may be made up of different blood cells. You may receive red blood cells, platelets, plasma, or white blood cells. Your temperature, blood pressure, and pulse will be checked often. After the procedure, your blood may be tested to see how you have responded. This information is not intended to replace advice given to you by your health care provider. Make sure you discuss any questions you have with your health care provider. Document Revised: 06/18/2021 Document Reviewed: 06/18/2021 Elsevier Patient Education  2024 [employer].

## 2023-09-25 LAB — TYPE AND SCREEN
ABO/RH(D): B POS
Antibody Screen: NEGATIVE
Unit division: 0
Unit division: 0

## 2023-09-25 LAB — BPAM RBC
Blood Product Expiration Date: 202506252359
Blood Product Expiration Date: 202507022359
ISSUE DATE / TIME: 202506210921
ISSUE DATE / TIME: 202506211119
Unit Type and Rh: 1700
Unit Type and Rh: 1700

## 2023-09-28 ENCOUNTER — Encounter: Payer: Self-pay | Admitting: Hematology

## 2023-09-28 NOTE — Progress Notes (Incomplete)
 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    Prednisone  60 mg daily in the January 2022 to treat her hemolytic anemia.  This was tapered off after complete hematological response.  HISTORY OF PRESENTING ILLNESS:   Jacqueline Hancock is a wonderful 83 y.o. female who is here for continued evaluation and management of NHL, unspecified body region. Patient was following up with Dr. Dirk Fredericks.   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 BM Bx Cyclin D1 IHC +ve byt FISH neg . She has presented with splenomegaly, anemia, and lymphocytosis. 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   Patient's current treatment is Imbruvica  280 mg. She was started on Imbruvica  560 mg on January 2022, which was then reduced to 280 mg in July 2023.   Patient was last seen by Dr. Dirk Fredericks on 02/10/2022 and she was doing well overall.   Patient reports she has been doing well overall since our last visit. She does complains of itchy skin rashes, mostly around the face and head, for around 2-3 weeks ago. Patient notes that she was started couple of new medications from other physicians, but she is unsure of the names. Patient notes that she was started on oxycodone around 3-4 weeks ago. She discontinued oxycodone.   She notes that  there is redness around her face, which is not new.   Patient is currently taking Imbruvica  280 mg as prescribed and has been tolerating it well without any severe toxicities.   She is currently taking Iron supplement, calcium supplement, and Vitamin-D supplement.   She denies fever, chills, night sweats, unexpected weight loss, back pain, abdominal pain, chest pain, or leg swelling. Patient does complain of occasional abdominal pain.   Patient notes she had a surgery near her right neck/ear area to remove an enlarged lymph node couple years ago. She complains of mild occasional pain near the site.  INTERVAL HISTORY:  Jacqueline Hancock is a wonderful 83 y.o. 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   Patient was last seen by me on 09/12/2023 and complained of abdominal tightness, SOB sometimes when lying down, leg swelling, and lesion in left lower extremity.  She presents today for toxicity check following cycle 2 of her R-miniCHOP treatment with Rituxan .   She presents in a wheelchair and is accompanied by her son during today's visit.   Patient reports that her legs were weeping and her legs were wrapped by home care nurse. She reports that she takes Lasix  sometimes, though not daily due to concern for urinary frequency.  She complains of poor taste. Patient notes that she has been eating more than usual.   She reports that her abdomen has been less tight and her abdominal pain has improved.  She denies any fever, chills, night sweats, syncope, near-syncope sensation, chest pain, or issues passing urine.  She reports that she has not been very well-hydrated.   She  has been moving her bowels well.   She notes that she is unable to swallow meat, but has no issues swallowing solid foods otherwise.   She complains that when she is sleeping, she has difficulty breathing. She notes that she has a hospital bed and is able to raise the head-end of the bed.    Patient denies any new lumps/bumps.   MEDICAL HISTORY:  Past Medical History:  Diagnosis Date   CLL (chronic lymphocytic leukemia) (HCC)    Dyspnea 2021   with exersion    Early cataracts, bilateral    Elevated hemoglobin A1c    Fibroids    GERD (gastroesophageal reflux disease)    Hyperlipidemia    Hypertension    Lymphoma (HCC)    Osteopenia     SURGICAL HISTORY: Past Surgical History:  Procedure Laterality Date   ABDOMINAL HYSTERECTOMY     APPENDECTOMY     BALLOON DILATION N/A 08/31/2023   Procedure: BALLOON DILATION;  Surgeon: Lajuan Pila, MD;  Location: [facility];  Service: Gastroenterology;  Laterality: N/A;   CATARACT EXTRACTION, BILATERAL Bilateral 09/2017   Dr. 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   TONSILLECTOMY AND ADENOIDECTOMY      SOCIAL HISTORY: Social History   Socioeconomic History   Marital status: Widowed    Spouse name: Not on file   Number of children: 3   Years of education: Not on file   Highest education level: Not on file  Occupational History   Occupation: retired  Tobacco Use   Smoking status: Never   Smokeless tobacco: Never  Vaping Use   Vaping status: Never Used  Substance and Sexual Activity   Alcohol use: No   Drug use: Never   Sexual activity: Not on file  Other Topics Concern   Not on file  Social History Narrative   Not on file   Social Drivers of Health   Financial Resource Strain: Not on file  Food Insecurity: No Food Insecurity (08/29/2023)   Hunger Vital Sign    Worried About Running Out of Food in the Last Year: Never true    Ran Out of Food in the Last Year: Never true  Transportation Needs: No Transportation Needs (08/29/2023)   PRAPARE - Therapist, art (Medical): No    Lack of Transportation (Non-Medical): No  Physical Activity: Not on file  Stress: Not on file  Social Connections: Moderately Integrated (08/29/2023)   Social Connection and Isolation Panel    Frequency of Communication with Friends and Family: More than three times a week    Frequency of Social Gatherings with Friends and Family: Three times a week    Attends Religious Services: More than 4 times per year    Active Member of Clubs or Organizations: Yes    Attends Banker Meetings: More than 4 times per year    Marital Status: Widowed  Intimate Partner Violence: Not At Risk (08/29/2023)   Humiliation, Afraid, Rape, and Kick questionnaire    Fear of Current or Ex-Partner: No    Emotionally Abused: No    Physically Abused: No    Sexually Abused: No    FAMILY HISTORY: Family History  Problem Relation Age of Onset   Leukemia Mother  Hypertension Father    Diabetes Sister    Heart disease Brother    Diabetes Brother    Diabetes Brother    Hypertension Son    Heart disease Son     ALLERGIES:  is allergic to nexium [esomeprazole magnesium] and peanut butter flavoring agent (non-screening).  MEDICATIONS:  Current Outpatient Medications  Medication Sig Dispense Refill   B Complex Vitamins (VITAMIN B-COMPLEX PO) Take 1 tablet by mouth daily.     entecavir  (BARACLUDE ) 0.5 MG tablet Take 1 tablet (0.5 mg total) by mouth every 3 (three) days. 10 tablet 0   loratadine (CLARITIN) 10 MG tablet Take 10 mg by mouth daily.     midodrine  (PROAMATINE ) 10 MG tablet Take 1 tablet (10 mg total) by mouth 2 (two) times daily with a meal. 60 tablet 0   pantoprazole  (PROTONIX ) 40 MG tablet Take 1 tablet (40 mg total) by mouth 2 (two) times daily for 56 days, THEN 1 tablet (40 mg total) daily. 142 tablet 0   polyethylene glycol (MIRALAX  / GLYCOLAX ) 17 g packet Take 17 g by mouth daily as needed for mild constipation or moderate constipation. Also  available OTC 30 each 0   prochlorperazine  (COMPAZINE ) 10 MG tablet Take 1 tablet (10 mg total) by mouth every 6 (six) hours as needed for nausea or vomiting. 30 tablet 6   senna-docusate (SENOKOT-S) 8.6-50 MG tablet Take 2 tablets by mouth at bedtime. For constipation 60 tablet 1   sucralfate  (CARAFATE ) 1 GM/10ML suspension Take 10 mLs (1 g total) by mouth 2 (two) times daily for 28 days. 560 mL 0   torsemide  (DEMADEX ) 20 MG tablet Take 1 tablet (20 mg total) by mouth daily as needed. Take for weight gain > 3 lbs from baseline 140 lbs  or shortness of breath 90 tablet 1   traMADol  (ULTRAM ) 50 MG tablet Take 1 tablet (50 mg total) by mouth every 8 (eight) hours as needed for moderate pain (pain score 4-6) or severe pain (pain score 7-10) (pain). 20 tablet 0   No current facility-administered medications for this visit.    REVIEW OF SYSTEMS:    10 Point review of Systems was done is negative except as noted above.   PHYSICAL EXAMINATION: ECOG PERFORMANCE STATUS: 2 - Symptomatic, <50% confined to bed  . There were no vitals filed for this visit.  There were no vitals filed for this visit. .There is no height or weight on file to calculate BMI.   GENERAL:alert, in no acute distress and comfortable SKIN: no acute rashes, no significant lesions EYES: conjunctiva are pink and non-injected, sclera anicteric OROPHARYNX: MMM, no exudates, no oropharyngeal erythema or ulceration NECK: supple, no JVD LYMPH:  no palpable lymphadenopathy in the cervical, axillary or inguinal regions LUNGS: clear to auscultation b/l with normal respiratory effort HEART: regular rate & rhythm ABDOMEN:  normoactive bowel sounds , non tender, not distended. Extremity: no pedal edema PSYCH: alert & oriented x 3 with fluent speech NEURO: no focal motor/sensory deficits   LABORATORY DATA:  I have reviewed the data as listed  .    Latest Ref Rng & Units 09/12/2023   12:13 PM 09/05/2023    1:46 AM 09/04/2023     2:26 AM  CBC  WBC 4.0 - 10.5 K/uL 17.1  14.5  14.1   Hemoglobin 12.0 - 15.0 g/dL 9.0  8.5  9.1   Hematocrit 36.0 - 46.0 % 29.4  29.0  30.9   Platelets 150 - 400 K/uL  189  127  131    .    Latest Ref Rng & Units 09/12/2023   12:13 PM 09/05/2023    1:46 AM 09/04/2023    2:26 AM  CMP  Glucose 70 - 99 mg/dL 85  82  82   BUN 8 - 23 mg/dL 17  44  53   Creatinine 0.44 - 1.00 mg/dL 6.04  5.40  9.81   Sodium 135 - 145 mmol/L 141  138  141   Potassium 3.5 - 5.1 mmol/L 4.0  4.5  4.4   Chloride 98 - 111 mmol/L 108  109  107   CO2 22 - 32 mmol/L 27  22  23    Calcium 8.9 - 10.3 mg/dL 8.1  7.9  8.0   Total Protein 6.5 - 8.1 g/dL 5.1  4.6    Total Bilirubin 0.0 - 1.2 mg/dL 0.6  0.8    Alkaline Phos 38 - 126 U/L 121  129    AST 15 - 41 U/L 14  28    ALT 0 - 44 U/L 8  14     . Lab Results  Component Value Date   LDH 624 (H) 08/28/2023   Component     Latest Ref Rng 08/18/2023  HBV DNA SERPL PCR-ACNC     IU/mL 110   HBV DNA SERPL PCR-LOG IU     log10 IU/mL 2.041   Test Info: Comment   Hepatitis A AB,Total     NON-REACTIVE  REACTIVE !   HCV Ab     NON REACTIVE  NON REACTIVE   HEP B CORE AB     Negative  Positive !   Hepatitis B Surface Ag     NON REACTIVE  Reactive !   Uric Acid, Serum     2.5 - 7.1 mg/dL     Legend: ! Abnormal   PATHOLOGY Surgical Pathology CASE: [id number] [phone] PATIENT: Jacqueline Hancock Bone Marrow Report     Clinical History: lymphocytosis, possible lymphoma     DIAGNOSIS:  BONE MARROW, ASPIRATE, CLOT, CORE: -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  The latter is primarily seen by flow cytometry.  The findings favor mantle cell  lymphoma but correlation with cytogenetic and FISH studies is recommended.  MICROSCOPIC DESCRIPTION:  PERIPHERAL BLOOD SMEAR: The red blood cells display prominent anisocytosis with macrocytic and normocytic cells.  There is mild to moderate poikilocytosis with teardrop cells, elliptocytes.  There is moderate polychromasia.  The white blood cells are increased in number with lymphocytosis characterized by predominance of small to medium sized lymphoid cells displaying high nuclear cytoplasmic ratio, round to irregular nuclei, coarse chromatin and small to inconspicuous nucleoli. The platelets are normal in number.     RADIOGRAPHIC STUDIES: I have personally reviewed the radiological images as listed and agreed with the findings in the report. VAS US  UPPER EXTREMITY VENOUS DUPLEX Result Date: 09/04/2023 UPPER VENOUS STUDY  Patient Name:  Jacqueline Hancock  Date of Exam:   09/03/2023 Medical Rec #: [id number]    Accession #:    [id number] Date of Birth: 06-Jul-1940    Patient Gender: F Patient Age:   50 years Exam Location:  [facility] Procedure:      VAS US  UPPER EXTREMITY VENOUS DUPLEX  Referring Phys: STEPHEN CHIU --------------------------------------------------------------------------------  Indications: Swelling Risk Factors: Cancer. Comparison Study: No prior studies. Performing Technologist: Lerry Ransom RVT  Examination Guidelines: A complete evaluation includes B-mode imaging, spectral Doppler, color Doppler, and power Doppler as needed of all accessible portions of each vessel. Bilateral testing is considered an integral part of a complete examination. Limited examinations for reoccurring indications may be performed as noted.  Right Findings: +----------+------------+---------+-----------+----------+-------+ RIGHT     CompressiblePhasicitySpontaneousPropertiesSummary +----------+------------+---------+-----------+----------+-------+ IJV           Full       Yes       Yes                       +----------+------------+---------+-----------+----------+-------+ Subclavian               Yes       Yes                      +----------+------------+---------+-----------+----------+-------+ Axillary      Full       Yes       Yes                      +----------+------------+---------+-----------+----------+-------+ Brachial      Full                                          +----------+------------+---------+-----------+----------+-------+ Radial        Full                                          +----------+------------+---------+-----------+----------+-------+ Ulnar         Full                                          +----------+------------+---------+-----------+----------+-------+ Cephalic      Full                                          +----------+------------+---------+-----------+----------+-------+ Basilic       Full                                          +----------+------------+---------+-----------+----------+-------+  Left Findings: +----------+------------+---------+-----------+----------+-------+ LEFT      CompressiblePhasicitySpontaneousPropertiesSummary +----------+------------+---------+-----------+----------+-------+ IJV           Full       Yes       Yes                      +----------+------------+---------+-----------+----------+-------+ Subclavian               Yes       Yes                      +----------+------------+---------+-----------+----------+-------+ Axillary      Full       Yes       Yes                      +----------+------------+---------+-----------+----------+-------+  Brachial      Full                                          +----------+------------+---------+-----------+----------+-------+ Radial        Full                                          +----------+------------+---------+-----------+----------+-------+ Ulnar         Full                                           +----------+------------+---------+-----------+----------+-------+ Cephalic      Full                                          +----------+------------+---------+-----------+----------+-------+ Basilic       Full                                          +----------+------------+---------+-----------+----------+-------+  Summary:  Right: No evidence of deep vein thrombosis in the upper extremity. No evidence of superficial vein thrombosis in the upper extremity.  Left: No evidence of deep vein thrombosis in the upper extremity. No evidence of superficial vein thrombosis in the upper extremity.  *See table(s) above for measurements and observations.  Diagnosing physician: Delaney Fearing Electronically signed by Delaney Fearing on 09/04/2023 at 3:48:36 PM.    Final    CT ABDOMEN PELVIS WO CONTRAST Result Date: 08/28/2023 CLINICAL DATA:  Failure to thrive. 1st chemotherapy this week. Known lymphoma. * Tracking Code: BO * EXAM: CT ABDOMEN AND PELVIS WITHOUT CONTRAST TECHNIQUE: Multidetector CT imaging of the abdomen and pelvis was performed following the standard protocol without IV contrast. RADIATION DOSE REDUCTION: This exam was performed according to the departmental dose-optimization program which includes automated exposure control, adjustment of the mA and/or kV according to patient size and/or use of iterative reconstruction technique. COMPARISON:  08/09/2023 FINDINGS: Lower chest: Initial thickening at the lung bases likely due to subsegmental atelectasis and/or air trapping. Normal heart size without pericardial or pleural effusion. Hepatobiliary: Normal noncontrast appearance of the liver, gallbladder. No gross intrahepatic biliary duct dilatation. Pancreas: Normal, without mass or ductal dilatation. Spleen: Normal in size, without focal abnormality. Adrenals/Urinary Tract: Normal left adrenal gland. No left renal calculi. Mild left-sided caliectasis is unchanged. The  dominant mass centered in the right abdominal retroperitoneum is amorphous, ill-defined and difficult to on this noncontrast exam. Estimated at 12.6 by 9.6 cm on 38/2, relatively similar to 08/09/2023. This is encompasses and obscures the right adrenal gland right kidney. Amorphous soft tissue extends into the left-sided abdominal retroperitoneum including on 43/2, relatively similar. Soft tissue thickening is most apparent about the posterior bladder wall with suggestion of soft tissue fullness about the right bladder base including on 73/2. This is less masslike than on the prior exam. Stomach/Bowel: Tiny hiatal hernia. No gastric outlet obstruction. Normal colon and terminal ileum. Normal small bowel caliber. Vascular/Lymphatic: Aortic atherosclerosis. Prominent but not pathologically sized abdominal  retroperitoneal nodes. Reproductive: Hysterectomy.  No adnexal mass. Other: Pelvic floor laxity. Small volume perihepatic ascites, decreased. Musculoskeletal: No acute osseous abnormality. IMPRESSION: 1. Limited exam secondary to lack of oral or IV contrast. 2. Grossly similar appearance of amorphous, ill-defined soft tissue density mass within the abdominopelvic right-sided retroperitoneum. Extension or adjacent presumed lymphoma within the left-sided abdominal retroperitoneum. 3. No gross bowel obstruction or other acute complication. 4. Similar mild left-sided caliectasis, likely due to left abdominal retroperitoneal lymphoma. 5. Soft tissue thickening about the posterior bladder wall with decreased soft tissue fullness about the right bladder base. Suspicious for lymphomatous involvement. 6. Decrease in small volume perihepatic ascites 7. Incidental findings, including: Aortic Atherosclerosis (ICD10-I70.0). Tiny hiatal hernia. Electronically Signed   By: Lore Rode M.D.   On: 08/28/2023 16:51   DG Chest Portable 1 View Result Date: 08/28/2023 CLINICAL DATA:  Weakness, lightheadedness, dizziness, history of  lymphoma EXAM: PORTABLE CHEST 1 VIEW COMPARISON:  08/15/2023 FINDINGS: Single frontal view of the chest demonstrates right chest wall port via internal jugular approach, tip overlying superior vena cava. Cardiac silhouette is unremarkable. No acute airspace disease, effusion, or pneumothorax. No acute bony abnormalities. IMPRESSION: 1. No acute intrathoracic process. Electronically Signed   By: Bobbye Burrow M.D.   On: 08/28/2023 15:31   ECHOCARDIOGRAM COMPLETE Result Date: 08/22/2023    ECHOCARDIOGRAM REPORT   Patient Name:   Jacqueline Hancock Date of Exam: 08/22/2023 Medical Rec #:  [id number]   Height:       59.0 in Accession #:    [id number]  Weight:       140.0 lb Date of Birth:  1941/01/11   BSA:          1.585 m Patient Age:    83 years    BP:           114/63 mmHg Patient Gender: F           HR:           78 bpm. Exam Location:  Inpatient Procedure: 2D Echo, Color Doppler and Cardiac Doppler (Both Spectral and Color            Flow Doppler were utilized during procedure). Indications:    Chemo  History:        Patient has prior history of Echocardiogram examinations, most                 recent 03/22/2023.  Sonographer:    Andrena Bang Referring Phys: [id number] Frankie Israel  Sonographer Comments: Known apical displaced papillary muscle in LV. IMPRESSIONS  1. Left ventricular ejection fraction, by estimation, is 60 to 65%. The left ventricle has normal function. The left ventricle has no regional wall motion abnormalities. Left ventricular diastolic parameters are consistent with Grade I diastolic dysfunction (impaired relaxation). The average left ventricular global longitudinal strain is -17.5 %. The global longitudinal strain is normal.  2. Right ventricular systolic function is normal. The right ventricular size is normal.  3. The mitral valve is normal in structure. Mild to moderate mitral valve regurgitation. No evidence of mitral stenosis.  4. The aortic valve is grossly normal. Aortic valve  regurgitation is trivial. No aortic stenosis is present.  5. The inferior vena cava is normal in size with greater than 50% respiratory variability, suggesting right atrial pressure of 3 mmHg. Comparison(s): No significant change from prior study. Prior images reviewed side by side. FINDINGS  Left Ventricle: Left ventricular ejection fraction, by estimation, is 60 to 65%.  The left ventricle has normal function. The left ventricle has no regional wall motion abnormalities. The average left ventricular global longitudinal strain is -17.5 %. Strain was performed and the global longitudinal strain is normal. The left ventricular internal cavity size was normal in size. There is no left ventricular hypertrophy. Left ventricular diastolic parameters are consistent with Grade I diastolic dysfunction (impaired relaxation). Normal left ventricular filling pressure. Right Ventricle: The right ventricular size is normal. No increase in right ventricular wall thickness. Right ventricular systolic function is normal. Left Atrium: Left atrial size was normal in size. Right Atrium: Right atrial size was normal in size. Pericardium: There is no evidence of pericardial effusion. Mitral Valve: The mitral valve is normal in structure. Mild to moderate mitral valve regurgitation, with centrally-directed jet. No evidence of mitral valve stenosis. Tricuspid Valve: The tricuspid valve is normal in structure. Tricuspid valve regurgitation is mild . No evidence of tricuspid stenosis. Aortic Valve: The aortic valve is grossly normal. Aortic valve regurgitation is trivial. No aortic stenosis is present. Aortic valve mean gradient measures 4.0 mmHg. Aortic valve peak gradient measures 10.9 mmHg. Aortic valve area, by VTI measures 1.86 cm. Pulmonic Valve: The pulmonic valve was normal in structure. Pulmonic valve regurgitation is not visualized. No evidence of pulmonic stenosis. Aorta: The aortic root is normal in size and structure. Venous:  The inferior vena cava is normal in size with greater than 50% respiratory variability, suggesting right atrial pressure of 3 mmHg. IAS/Shunts: No atrial level shunt detected by color flow Doppler.  LEFT VENTRICLE PLAX 2D LVIDd:         3.90 cm     Diastology LVIDs:         2.20 cm     LV e' medial:    8.49 cm/s LV PW:         1.00 cm     LV E/e' medial:  7.9 LV IVS:        0.90 cm     LV e' lateral:   10.60 cm/s LVOT diam:     1.70 cm     LV E/e' lateral: 6.3 LV SV:         46 LV SV Index:   29          2D Longitudinal Strain LVOT Area:     2.27 cm    2D Strain GLS Avg:     -17.5 %  LV Volumes (MOD) LV vol d, MOD A2C: 49.6 ml LV vol d, MOD A4C: 54.7 ml LV vol s, MOD A2C: 16.9 ml LV vol s, MOD A4C: 24.9 ml LV SV MOD A2C:     32.7 ml LV SV MOD A4C:     54.7 ml LV SV MOD BP:      32.7 ml RIGHT VENTRICLE RV S prime:     16.90 cm/s TAPSE (M-mode): 2.0 cm LEFT ATRIUM             Index LA diam:        3.10 cm 1.96 cm/m LA Vol (A2C):   22.3 ml 14.07 ml/m LA Vol (A4C):   24.7 ml 15.58 ml/m LA Biplane Vol: 24.1 ml 15.21 ml/m  AORTIC VALVE AV Area (Vmax):    1.69 cm AV Area (Vmean):   1.85 cm AV Area (VTI):     1.86 cm AV Vmax:           165.00 cm/s AV Vmean:          86.200  cm/s AV VTI:            0.247 m AV Peak Grad:      10.9 mmHg AV Mean Grad:      4.0 mmHg LVOT Vmax:         123.00 cm/s LVOT Vmean:        70.300 cm/s LVOT VTI:          0.202 m LVOT/AV VTI ratio: 0.82 MITRAL VALVE               TRICUSPID VALVE MV Area (PHT): 4.33 cm    TR Peak grad:   33.4 mmHg MV Decel Time: 175 msec    TR Vmax:        289.00 cm/s MR Peak grad: 147.4 mmHg MR Mean grad: 111.0 mmHg   SHUNTS MR Vmax:      607.00 cm/s  Systemic VTI:  0.20 m MR Vmean:     500.0 cm/s   Systemic Diam: 1.70 cm MV E velocity: 66.70 cm/s MV A velocity: 99.00 cm/s MV E/A ratio:  0.67 Mihai Croitoru MD Electronically signed by Luana Rumple MD Signature Date/Time: 08/22/2023/2:33:18 PM    Final     ASSESSMENT & PLAN:   83 y.o. woman with:   1.  CLL  - CD20+ CD5+ve lymphoproliferative disorder presented with hemolytic anemia and splenomegaly in December 2021. Though discussed with patient that FISH was neg and so Mantle cell lymphoma not confirm. Findings more consistent with CLL.  2.  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  3.  High risk of tumor lysis syndrome  4.  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  5) Hypercalcemia - ? From dehydration vs recurrent lymphoma  6) hepatitis B chronic active disease  PLAN:  -Discussed lab results on 09/21/23 in detail with patient. CBC showed WBC of 2.5K, hemoglobin of 6.2, and platelets of 147K. -she is very anemic, which is partly from her CKD, and partly from chemotherapy -platelets are near-normal -will set up patient for blood transfusion with 2 units PRBCs -there is some slight leukopenia, though not significant -creatinine improved from 4 to 1.74 today -albumin  improved to 3, which suggests that she is eating better -potassium normal -recommend patient to stay well-hydrated, drinking at least 2L of water daily -educated patient that non-caffeinated, non-carbonated, and non-alcoholic liquids would count towards hydration. -continue to eat as well as she can -recommend using Boost/Ensure nutritional supplement to optimize nutrition -patient received Rituxan  with cycle 2 of treatment and denies any major toxicities -continue R-mini CHOP treatment  regimen as scheduled at current doses -discussed that I am hopeful that as her tumor shrinks, it will take pressure off of the kidneys -discussed that generally, if there is concern for anemia, we would transfuse her as needed, generally without adjusting treatment dose. Discussed that there may be a role to adjust treatment if WBCs or PLT are very low, which is not a concern at this time.   -Will plan to repeat her PET scan after her next treatment -her abdomen appears to be softer on palpation -recommend using pillows to prop her up while lying down or raise head-end of bed given that abdominal tightness can cause more pressure when laying flat and cause breathing issues.  -continue lasix  to improve leg swelling -recommend keeping the legs elevated as much as she can during the day  to improve leg swelling -recommend having her whole legs ace wrapped with home nurse to keep her leg swelling down -She is on allopurinol  for TLS prophylaxis -continue to follow-up with infectious disease for management of her chronic active hepatitis B  FOLLOW-UP: Transfusion of 2 units of PRBC ASAP RTC with C3 of R-miniCHOP as per orders with portflush, labs and MD visit  The total time spent in the appointment was *** minutes* .  All of the patient's questions were answered with apparent satisfaction. The patient knows to call the clinic with any problems, questions or concerns.   Jacquelyn Matt MD MS AAHIVMS [facility] Hematology/Oncology Physician [facility]  .*Total Encounter Time as defined by the Centers for Medicare and Medicaid Services includes, in addition to the face-to-face time of a patient visit (documented in the note above) non-face-to-face time: obtaining and reviewing outside history, ordering and reviewing medications, tests or procedures, care coordination (communications with other health care professionals or caregivers) and documentation in the medical record.    I,Mitra Faeizi,acting as a Neurosurgeon for Jacquelyn Matt, MD.,have documented all relevant documentation on the behalf of Jacquelyn Matt, MD,as directed by  Jacquelyn Matt, MD while in the presence of Jacquelyn Matt, MD.  ***

## 2023-10-03 ENCOUNTER — Telehealth: Payer: Self-pay

## 2023-10-03 ENCOUNTER — Telehealth: Payer: Self-pay | Admitting: Hematology

## 2023-10-03 MED FILL — Fosaprepitant Dimeglumine For IV Infusion 150 MG (Base Eq): INTRAVENOUS | Qty: 5 | Status: AC

## 2023-10-03 NOTE — Telephone Encounter (Signed)
 Left the patient a voicemail informing her that she does not need to come in at 10:15 for labs and we now have her scheduled at 11:15.

## 2023-10-03 NOTE — Telephone Encounter (Signed)
 Copied from CRM [id number]. Topic: General - Other >> Oct 02, 2023  5:02 PM Mercer PEDLAR wrote: Reason for CRM: Diandra calling from [facility] to notify the PCP of an update to the patient's care plan:  The patient has developed a new wound on the left lower leg, which we will be treating. There is also a wound on the medial posterior aspect of the right thigh. Treatment is scheduled twice a week for four weeks.  For any questions or follow-up, please call [phone].

## 2023-10-03 NOTE — Telephone Encounter (Signed)
 Noted

## 2023-10-04 ENCOUNTER — Inpatient Hospital Stay: Admitting: Physician Assistant

## 2023-10-04 ENCOUNTER — Inpatient Hospital Stay

## 2023-10-04 ENCOUNTER — Inpatient Hospital Stay: Attending: Hematology

## 2023-10-04 ENCOUNTER — Encounter: Payer: Self-pay | Admitting: Hematology

## 2023-10-04 VITALS — BP 107/92 | HR 80 | Resp 16 | Wt 141.0 lb

## 2023-10-04 VITALS — BP 147/64 | HR 63 | Temp 97.7°F | Resp 20 | Wt 198.4 lb

## 2023-10-04 DIAGNOSIS — D591 Autoimmune hemolytic anemia, unspecified: Secondary | ICD-10-CM | POA: Insufficient documentation

## 2023-10-04 DIAGNOSIS — Z79899 Other long term (current) drug therapy: Secondary | ICD-10-CM | POA: Insufficient documentation

## 2023-10-04 DIAGNOSIS — C9110 Chronic lymphocytic leukemia of B-cell type not having achieved remission: Secondary | ICD-10-CM | POA: Insufficient documentation

## 2023-10-04 DIAGNOSIS — Z5111 Encounter for antineoplastic chemotherapy: Secondary | ICD-10-CM | POA: Diagnosis not present

## 2023-10-04 DIAGNOSIS — R10819 Abdominal tenderness, unspecified site: Secondary | ICD-10-CM | POA: Diagnosis not present

## 2023-10-04 DIAGNOSIS — I214 Non-ST elevation (NSTEMI) myocardial infarction: Secondary | ICD-10-CM | POA: Diagnosis not present

## 2023-10-04 DIAGNOSIS — C8338 Diffuse large B-cell lymphoma, lymph nodes of multiple sites: Secondary | ICD-10-CM

## 2023-10-04 DIAGNOSIS — Z95828 Presence of other vascular implants and grafts: Secondary | ICD-10-CM

## 2023-10-04 DIAGNOSIS — B181 Chronic viral hepatitis B without delta-agent: Secondary | ICD-10-CM | POA: Insufficient documentation

## 2023-10-04 DIAGNOSIS — R1031 Right lower quadrant pain: Secondary | ICD-10-CM | POA: Insufficient documentation

## 2023-10-04 DIAGNOSIS — Z806 Family history of leukemia: Secondary | ICD-10-CM | POA: Insufficient documentation

## 2023-10-04 DIAGNOSIS — J9601 Acute respiratory failure with hypoxia: Secondary | ICD-10-CM | POA: Diagnosis not present

## 2023-10-04 LAB — CMP (CANCER CENTER ONLY)
ALT: 11 U/L (ref 0–44)
AST: 16 U/L (ref 15–41)
Albumin: 3.4 g/dL — ABNORMAL LOW (ref 3.5–5.0)
Alkaline Phosphatase: 109 U/L (ref 38–126)
Anion gap: 9 (ref 5–15)
BUN: 24 mg/dL — ABNORMAL HIGH (ref 8–23)
CO2: 29 mmol/L (ref 22–32)
Calcium: 8.5 mg/dL — ABNORMAL LOW (ref 8.9–10.3)
Chloride: 103 mmol/L (ref 98–111)
Creatinine: 1.61 mg/dL — ABNORMAL HIGH (ref 0.44–1.00)
GFR, Estimated: 32 mL/min — ABNORMAL LOW (ref >60)
Glucose, Bld: 103 mg/dL — ABNORMAL HIGH (ref 70–99)
Potassium: 3.6 mmol/L (ref 3.5–5.1)
Sodium: 141 mmol/L (ref 135–145)
Total Bilirubin: 0.7 mg/dL (ref 0.0–1.2)
Total Protein: 5.8 g/dL — ABNORMAL LOW (ref 6.5–8.1)

## 2023-10-04 LAB — CBC WITH DIFFERENTIAL (CANCER CENTER ONLY)
Abs Immature Granulocytes: 0.12 10*3/uL — ABNORMAL HIGH (ref 0.00–0.07)
Basophils Absolute: 0.1 10*3/uL (ref 0.0–0.1)
Basophils Relative: 1 %
Eosinophils Absolute: 0.1 10*3/uL (ref 0.0–0.5)
Eosinophils Relative: 1 %
HCT: 28.3 % — ABNORMAL LOW (ref 36.0–46.0)
Hemoglobin: 9 g/dL — ABNORMAL LOW (ref 12.0–15.0)
Immature Granulocytes: 1 %
Lymphocytes Relative: 12 %
Lymphs Abs: 1.6 10*3/uL (ref 0.7–4.0)
MCH: 28.2 pg (ref 26.0–34.0)
MCHC: 31.8 g/dL (ref 30.0–36.0)
MCV: 88.7 fL (ref 80.0–100.0)
Monocytes Absolute: 1.7 10*3/uL — ABNORMAL HIGH (ref 0.1–1.0)
Monocytes Relative: 12 %
Neutro Abs: 10.1 10*3/uL — ABNORMAL HIGH (ref 1.7–7.7)
Neutrophils Relative %: 73 %
Platelet Count: 147 10*3/uL — ABNORMAL LOW (ref 150–400)
RBC: 3.19 MIL/uL — ABNORMAL LOW (ref 3.87–5.11)
RDW: 15.1 % (ref 11.5–15.5)
WBC Count: 13.7 10*3/uL — ABNORMAL HIGH (ref 4.0–10.5)
nRBC: 0 % (ref 0.0–0.2)

## 2023-10-04 LAB — SAMPLE TO BLOOD BANK

## 2023-10-04 MED ORDER — SODIUM CHLORIDE 0.9 % IV SOLN
INTRAVENOUS | Status: DC
Start: 2023-10-04 — End: 2023-10-04

## 2023-10-04 MED ORDER — SODIUM CHLORIDE 0.9% FLUSH
10.0000 mL | Freq: Once | INTRAVENOUS | Status: AC
Start: 2023-10-04 — End: 2023-10-04
  Administered 2023-10-04: 10 mL

## 2023-10-04 MED ORDER — DEXAMETHASONE SODIUM PHOSPHATE 10 MG/ML IJ SOLN
10.0000 mg | Freq: Once | INTRAMUSCULAR | Status: AC
  Administered 2023-10-04: 10 mg via INTRAVENOUS
  Filled 2023-10-04: qty 1

## 2023-10-04 MED ORDER — DOXORUBICIN HCL CHEMO IV INJECTION 2 MG/ML
25.0000 mg/m2 | Freq: Once | INTRAVENOUS | Status: AC
  Administered 2023-10-04: 42 mg via INTRAVENOUS
  Filled 2023-10-04: qty 21

## 2023-10-04 MED ORDER — VINCRISTINE SULFATE CHEMO INJECTION 1 MG/ML
1.0000 mg | Freq: Once | INTRAVENOUS | Status: AC
  Administered 2023-10-04: 1 mg via INTRAVENOUS
  Filled 2023-10-04: qty 1

## 2023-10-04 MED ORDER — DIPHENHYDRAMINE HCL 25 MG PO CAPS
50.0000 mg | ORAL_CAPSULE | Freq: Once | ORAL | Status: AC
  Administered 2023-10-04: 50 mg via ORAL
  Filled 2023-10-04: qty 2

## 2023-10-04 MED ORDER — FAMOTIDINE IN NACL 20-0.9 MG/50ML-% IV SOLN
20.0000 mg | Freq: Once | INTRAVENOUS | Status: AC
  Administered 2023-10-04: 20 mg via INTRAVENOUS
  Filled 2023-10-04: qty 50

## 2023-10-04 MED ORDER — PALONOSETRON HCL INJECTION 0.25 MG/5ML
0.2500 mg | Freq: Once | INTRAVENOUS | Status: AC
  Administered 2023-10-04: 0.25 mg via INTRAVENOUS
  Filled 2023-10-04: qty 5

## 2023-10-04 MED ORDER — ACETAMINOPHEN 325 MG PO TABS
650.0000 mg | ORAL_TABLET | Freq: Once | ORAL | Status: AC
  Administered 2023-10-04: 650 mg via ORAL
  Filled 2023-10-04: qty 2

## 2023-10-04 MED ORDER — SODIUM CHLORIDE 0.9 % IV SOLN
400.0000 mg/m2 | Freq: Once | INTRAVENOUS | Status: AC
  Administered 2023-10-04: 660 mg via INTRAVENOUS
  Filled 2023-10-04: qty 33

## 2023-10-04 MED ORDER — SODIUM CHLORIDE 0.9 % IV SOLN
375.0000 mg/m2 | Freq: Once | INTRAVENOUS | Status: AC
  Administered 2023-10-04: 600 mg via INTRAVENOUS
  Filled 2023-10-04: qty 10

## 2023-10-04 MED ORDER — FOSAPREPITANT DIMEGLUMINE INJECTION 150 MG
150.0000 mg | Freq: Once | INTRAVENOUS | Status: AC
  Administered 2023-10-04: 150 mg via INTRAVENOUS
  Filled 2023-10-04: qty 150

## 2023-10-04 NOTE — Patient Instructions (Signed)
 [facility] - A DEPT OF [place]  [facility]  Discharge Instructions: Thank you for choosing [facility] to provide your oncology and hematology care.   If you have a lab appointment with the Cancer Center, please go directly to the Cancer Center and check in at the registration area.   Wear comfortable clothing and clothing appropriate for easy access to any Portacath or PICC line.   We strive to give you quality time with your provider. You may need to reschedule your appointment if you arrive late (15 or more minutes).  Arriving late affects you and other patients whose appointments are after yours.  Also, if you miss three or more appointments without notifying the office, you may be dismissed from the clinic at the provider's discretion.      For prescription refill requests, have your pharmacy contact our office and allow 72 hours for refills to be completed.    Today you received the following chemotherapy and/or immunotherapy agents Adriamycin , Cytoxan , Ruxience , Oncovin       To help prevent nausea and vomiting after your treatment, we encourage you to take your nausea medication as directed.  BELOW ARE SYMPTOMS THAT SHOULD BE REPORTED IMMEDIATELY: *FEVER GREATER THAN 100.4 F (38 C) OR HIGHER *CHILLS OR SWEATING *NAUSEA AND VOMITING THAT IS NOT CONTROLLED WITH YOUR NAUSEA MEDICATION *UNUSUAL SHORTNESS OF BREATH *UNUSUAL BRUISING OR BLEEDING *URINARY PROBLEMS (pain or burning when urinating, or frequent urination) *BOWEL PROBLEMS (unusual diarrhea, constipation, pain near the anus) TENDERNESS IN MOUTH AND THROAT WITH OR WITHOUT PRESENCE OF ULCERS (sore throat, sores in mouth, or a toothache) UNUSUAL RASH, SWELLING OR PAIN  UNUSUAL VAGINAL DISCHARGE OR ITCHING   Items with * indicate a potential emergency and should be followed up as soon as possible or go to the Emergency Department if any problems should occur.  Please show the CHEMOTHERAPY  ALERT CARD or IMMUNOTHERAPY ALERT CARD at check-in to the Emergency Department and triage nurse.  Should you have questions after your visit or need to cancel or reschedule your appointment, please contact [facility] DE[facility]  Dept: [phone]  and follow the prompts.  Office hours are 8:00 a.m. to 4:30 p.m. Monday - Friday. Please note that voicemails left after 4:00 p.m. may not be returned until the following business day.  We are closed weekends and major holidays. You have access to a nurse at all times for urgent questions. Please call the main number to the clinic Dept: [phone] and follow the prompts.   For any non-urgent questions, you may also contact your provider using MyChart. We now offer e-Visits for anyone 43 and older to request care online for non-urgent symptoms. For details visit [email or web address].   Also download the MyChart app! Go to the app store, search MyChart, open the app, select , and log in with your MyChart username and password.

## 2023-10-04 NOTE — Progress Notes (Signed)
 OK to treat with Creat 1.61 per Dr Onesimo.

## 2023-10-05 ENCOUNTER — Encounter: Payer: Self-pay | Admitting: Hematology

## 2023-10-05 ENCOUNTER — Telehealth: Payer: Self-pay | Admitting: Hematology

## 2023-10-05 LAB — URINE CULTURE: Culture: NO GROWTH

## 2023-10-05 NOTE — Telephone Encounter (Signed)
 Left the patient a voicemail with the scheduled lab and transfusion appointment details for LOS notes.

## 2023-10-05 NOTE — Progress Notes (Signed)
 HEMATOLOGY/ONCOLOGY CLINIC NOTE  Date of Service: 10/04/23  Patient Care Team: Ngetich, Roxan BROCKS, NP as PCP - General (Family Medicine) Arlana Arnt, MD as Consulting Physician (Otolaryngology) Luis Purchase, MD as Consulting Physician (Gastroenterology) Raeanne, Shanda SQUIBB, MD (Inactive) as Consulting Physician (Obstetrics and Gynecology) Roz Anes, MD as Consulting Physician (Ophthalmology) Quinn Odor, [facility] (Inactive) as Pharmacist (Pharmacist) Onesimo Emaline Brink, MD as Consulting Physician (Hematology)  CHIEF COMPLAINTS/PURPOSE OF CONSULTATION:  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  She was last seen by Dr. Onesimo on 09/21/2023.  She is accompanied by her son for this visit.  Ms. Jacqueline Hancock reports that she is tolerating treatment without any significant toxicities.  Her energy is fairly stable and she is uses a walker to help with ambulation at home and requires a wheelchair for longer distances.  She denies nausea, vomiting or bowel habit changes.  She continues to have lower extremity edema with a wound on the left anterior leg secondary to her legs weeping.  She reports new onset of intermittent right flank/lower quadrant abdominal pain that started this past week.  She rates the pain as 4 out of 10.  She tried to take tramadol  50 mg tablet but discontinued as it was causing her excessive dizziness.  She denies fevers, chills, night sweats, shortness of breath, chest pain or cough.  MEDICAL HISTORY:  Past Medical History:  Diagnosis Date   CLL (chronic lymphocytic leukemia) (HCC)    Dyspnea 2021   with exersion    Early cataracts, bilateral    Elevated hemoglobin A1c    Fibroids    GERD (gastroesophageal reflux disease)    Hyperlipidemia    Hypertension    Lymphoma (HCC)    Osteopenia      SURGICAL HISTORY: Past Surgical History:  Procedure Laterality Date   ABDOMINAL HYSTERECTOMY     APPENDECTOMY     BALLOON DILATION N/A 08/31/2023   Procedure: BALLOON DILATION;  Surgeon: Charlanne Groom, MD;  Location: [facility];  Service: Gastroenterology;  Laterality: N/A;   CATARACT EXTRACTION, BILATERAL Bilateral 09/2017   Dr. 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   TONSILLECTOMY AND ADENOIDECTOMY      SOCIAL HISTORY: Social History   Socioeconomic History   Marital status: Widowed    Spouse name: Not on file   Number of children: 3   Years of education: Not on file   Highest education level: Not on file  Occupational History   Occupation: retired  Tobacco Use   Smoking status: Never   Smokeless tobacco: Never  Vaping Use   Vaping status: Never Used  Substance and Sexual Activity   Alcohol use: No   Drug use: Never   Sexual activity: Not on file  Other Topics Concern   Not on file  Social History Narrative   Not on file   Social Drivers of Health   Financial Resource Strain: Not on file  Food Insecurity: No Food Insecurity (08/29/2023)   Hunger Vital Sign    Worried About Running Out of Food in the Last Year: Never true    Ran Out of  Food in the Last Year: Never true  Transportation Needs: No Transportation Needs (08/29/2023)   PRAPARE - Administrator, Civil Service (Medical): No    Lack of Transportation (Non-Medical): No  Physical Activity: Not on file  Stress: Not on file  Social Connections: Moderately Integrated (08/29/2023)   Social Connection and Isolation Panel    Frequency of Communication with Friends and Family: More than three times a week    Frequency of Social Gatherings with  Friends and Family: Three times a week    Attends Religious Services: More than 4 times per year    Active Member of Clubs or Organizations: Yes    Attends Banker Meetings: More than 4 times per year    Marital Status: Widowed  Intimate Partner Violence: Not At Risk (08/29/2023)   Humiliation, Afraid, Rape, and Kick questionnaire    Fear of Current or Ex-Partner: No    Emotionally Abused: No    Physically Abused: No    Sexually Abused: No    FAMILY HISTORY: Family History  Problem Relation Age of Onset   Leukemia Mother    Hypertension Father    Diabetes Sister    Heart disease Brother    Diabetes Brother    Diabetes Brother    Hypertension Son    Heart disease Son     ALLERGIES:  is allergic to nexium [esomeprazole magnesium] and peanut butter flavoring agent (non-screening).  MEDICATIONS:  Current Outpatient Medications  Medication Sig Dispense Refill   B Complex Vitamins (VITAMIN B-COMPLEX PO) Take 1 tablet by mouth daily.     entecavir  (BARACLUDE ) 0.5 MG tablet Take 1 tablet (0.5 mg total) by mouth every 3 (three) days. 10 tablet 0   loratadine (CLARITIN) 10 MG tablet Take 10 mg by mouth daily.     midodrine  (PROAMATINE ) 10 MG tablet Take 1 tablet (10 mg total) by mouth 2 (two) times daily with a meal. 60 tablet 0   pantoprazole  (PROTONIX ) 40 MG tablet Take 1 tablet (40 mg total) by mouth 2 (two) times daily for 56 days, THEN 1 tablet (40 mg total) daily. 142 tablet 0   polyethylene glycol (MIRALAX  / GLYCOLAX ) 17 g packet Take 17 g by mouth daily as needed for mild constipation or moderate constipation. Also available OTC 30 each 0   prochlorperazine  (COMPAZINE ) 10 MG tablet Take 1 tablet (10 mg total) by mouth every 6 (six) hours as needed for nausea or vomiting. 30 tablet 6   senna-docusate (SENOKOT-S) 8.6-50 MG tablet Take 2 tablets by mouth at bedtime. For constipation 60 tablet 1   torsemide  (DEMADEX ) 20 MG tablet Take 1 tablet (20 mg total) by mouth daily  as needed. Take for weight gain > 3 lbs from baseline 140 lbs  or shortness of breath 90 tablet 1   traMADol  (ULTRAM ) 50 MG tablet Take 1 tablet (50 mg total) by mouth every 8 (eight) hours as needed for moderate pain (pain score 4-6) or severe pain (pain score 7-10) (pain). 20 tablet 0   sucralfate  (CARAFATE ) 1 GM/10ML suspension Take 10 mLs (1 g total) by mouth 2 (two) times daily for 28 days. (Patient not taking: Reported on 10/04/2023) 560 mL 0   No current facility-administered medications for this visit.    REVIEW OF SYSTEMS:    10 Point review of Systems was done is negative except as noted above.   PHYSICAL EXAMINATION: ECOG PERFORMANCE STATUS: 2 - Symptomatic, <50% confined to bed  . Vitals:  10/04/23 1114  BP: (!) 147/64  Pulse: 63  Resp: 20  Temp: 97.7 F (36.5 C)  SpO2: 99%    Filed Weights   10/04/23 1114  Weight: 198 lb 6.4 oz (90 kg)   .Body mass index is 40.07 kg/m.   GENERAL:alert, in no acute distress and comfortable SKIN: no acute rashes, no significant lesions EYES: conjunctiva are pink and non-injected, sclera anicteric LUNGS: clear to auscultation b/l with normal respiratory effort HEART: regular rate & rhythm ABDOMEN:  normoactive bowel sounds. Tenderness to palpation in right flank with some right CVA tenderness.  Extremity: no pedal edema PSYCH: alert & oriented x 3 with fluent speech NEURO: no focal motor/sensory deficits   LABORATORY DATA:  I have reviewed the data as listed  .    Latest Ref Rng & Units 10/04/2023   11:00 AM 09/21/2023    2:04 PM 09/12/2023   12:13 PM  CBC  WBC 4.0 - 10.5 K/uL 13.7  2.5  17.1   Hemoglobin 12.0 - 15.0 g/dL 9.0  6.2  9.0   Hematocrit 36.0 - 46.0 % 28.3  20.4  29.4   Platelets 150 - 400 K/uL 147  147  189    .    Latest Ref Rng & Units 10/04/2023   11:00 AM 09/21/2023    2:04 PM 09/12/2023   12:13 PM  CMP  Glucose 70 - 99 mg/dL 896  99  85   BUN 8 - 23 mg/dL 24  25  17    Creatinine 0.44 - 1.00 mg/dL  8.38  8.25  7.82   Sodium 135 - 145 mmol/L 141  141  141   Potassium 3.5 - 5.1 mmol/L 3.6  3.8  4.0   Chloride 98 - 111 mmol/L 103  108  108   CO2 22 - 32 mmol/L 29  25  27    Calcium 8.9 - 10.3 mg/dL 8.5  8.4  8.1   Total Protein 6.5 - 8.1 g/dL 5.8  5.2  5.1   Total Bilirubin 0.0 - 1.2 mg/dL 0.7  0.6  0.6   Alkaline Phos 38 - 126 U/L 109  116  121   AST 15 - 41 U/L 16  19  14    ALT 0 - 44 U/L 11  11  8     . Lab Results  Component Value Date   LDH 624 (H) 08/28/2023     PATHOLOGY Surgical Pathology CASE: [id number] PATIENT: Harly Simerly Bone Marrow Report     Clinical History: lymphocytosis, possible lymphoma     DIAGNOSIS:  BONE MARROW, ASPIRATE, CLOT, CORE: -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  The latter is primarily seen by flow cytometry.  The findings favor mantle cell lymphoma but correlation with cytogenetic and FISH studies is recommended.  MICROSCOPIC DESCRIPTION:  PERIPHERAL BLOOD SMEAR: The red blood cells display prominent anisocytosis with macrocytic and normocytic cells.  There is mild to moderate poikilocytosis with teardrop cells, elliptocytes.  There is moderate polychromasia.  The white blood cells are increased in number with lymphocytosis characterized by predominance of small to medium sized lymphoid cells displaying high nuclear cytoplasmic ratio, round to irregular nuclei, coarse chromatin and small to inconspicuous nucleoli. The platelets are normal in number.     RADIOGRAPHIC  STUDIES: I have personally reviewed the radiological images as listed and agreed with the findings in the report. No results  found.   ASSESSMENT & PLAN:   83 y.o. woman with:   1.  Chronic Lymphocytic Leukemia - CD20 positive, CD5 positive --Presented with hemolytic anemia and splenomegaly in December 2021. Bone marrow biopsy confirmed  --Started prednisone  60 mg daily in January 2022 to treat her hemolytic anemia.  This was tapered off after complete hematological response.  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 --Biopsy of large retroperitoneal mass from 08/04/2023 showed signs of CLL as well as areas that have histologically transformed from CLL to large B cell lymphoma  --Recommended R-mini CHOP, started on 08/23/2023  3. Hepatitis B chronic active disease --Continue to follow up with infectious disease.   PLAN: --Due for Cycle 3, Day 1 of R-mini-CHOP today --Labs from today were reviewed and adequate for treatment. WBC 13.7, Hgb 9.0, Plt 147K, Creatinine improving to 1.61, LFTs normal.  --Proceed with treatment today without any dose modification --Due to new onset right lower quadrant abdominal pain, will obtain UA/culture and will further evaluate with upcoming PET scan. Suspect underlying cause is large retroperitoneal mass. Patient tried to take tramadol  50 mg tablet but unable to tolerate due to dizziness. Suggest to try Tylenol  extra strength as needed not to exceed 2000 mg per day.  --Continue allopurinol  for TLS prophylaxis  FOLLOW-UP: --RTC on 10/16/2023 for labs and transfusion if needed --RTC on 10/25/2023 for labs/follow up prior to Cycle 4, Day 1. Will have PET/CT scan prior to visit.    All of the patient's questions were answered with apparent satisfaction. The patient knows to call the clinic with any problems, questions or concerns.   I have spent a total of 30 minutes minutes of face-to-face and non-face-to-face time,  preparing to see the patient, obtaining and/or reviewing separately obtained history, performing a medically appropriate examination, counseling and educating the patient, ordering medications/tests/procedures, referring and communicating with other health care professionals, documenting clinical information in the electronic health record, independently interpreting results and communicating results to the patient, and care coordination.   Johnston Police PA-C Dept of Hematology and Oncology [facility] at [facility] Phone: [phone]

## 2023-10-06 ENCOUNTER — Encounter (HOSPITAL_COMMUNITY): Payer: Self-pay | Admitting: Pulmonary Disease

## 2023-10-06 ENCOUNTER — Other Ambulatory Visit: Payer: Self-pay

## 2023-10-06 ENCOUNTER — Emergency Department (HOSPITAL_COMMUNITY)

## 2023-10-06 ENCOUNTER — Inpatient Hospital Stay (HOSPITAL_COMMUNITY)
Admission: EM | Admit: 2023-10-06 | Discharge: 2023-10-12 | DRG: 280 | Disposition: A | Discharge status: Home Health | Attending: Internal Medicine | Admitting: Internal Medicine

## 2023-10-06 ENCOUNTER — Inpatient Hospital Stay (HOSPITAL_COMMUNITY)

## 2023-10-06 DIAGNOSIS — J9692 Respiratory failure, unspecified with hypercapnia: Secondary | ICD-10-CM | POA: Diagnosis present

## 2023-10-06 DIAGNOSIS — Z79899 Other long term (current) drug therapy: Secondary | ICD-10-CM

## 2023-10-06 DIAGNOSIS — J811 Chronic pulmonary edema: Secondary | ICD-10-CM | POA: Diagnosis not present

## 2023-10-06 DIAGNOSIS — I161 Hypertensive emergency: Secondary | ICD-10-CM | POA: Diagnosis present

## 2023-10-06 DIAGNOSIS — N1832 Chronic kidney disease, stage 3b: Secondary | ICD-10-CM | POA: Diagnosis present

## 2023-10-06 DIAGNOSIS — I214 Non-ST elevation (NSTEMI) myocardial infarction: Secondary | ICD-10-CM | POA: Diagnosis present

## 2023-10-06 DIAGNOSIS — I1310 Hypertensive heart and chronic kidney disease without heart failure, with stage 1 through stage 4 chronic kidney disease, or unspecified chronic kidney disease: Secondary | ICD-10-CM

## 2023-10-06 DIAGNOSIS — J9602 Acute respiratory failure with hypercapnia: Secondary | ICD-10-CM | POA: Diagnosis present

## 2023-10-06 DIAGNOSIS — C8510 Unspecified B-cell lymphoma, unspecified site: Secondary | ICD-10-CM | POA: Diagnosis present

## 2023-10-06 DIAGNOSIS — C9110 Chronic lymphocytic leukemia of B-cell type not having achieved remission: Secondary | ICD-10-CM | POA: Diagnosis present

## 2023-10-06 DIAGNOSIS — Z833 Family history of diabetes mellitus: Secondary | ICD-10-CM

## 2023-10-06 DIAGNOSIS — Z90710 Acquired absence of both cervix and uterus: Secondary | ICD-10-CM

## 2023-10-06 DIAGNOSIS — I5021 Acute systolic (congestive) heart failure: Secondary | ICD-10-CM | POA: Diagnosis present

## 2023-10-06 DIAGNOSIS — K219 Gastro-esophageal reflux disease without esophagitis: Secondary | ICD-10-CM | POA: Diagnosis present

## 2023-10-06 DIAGNOSIS — R739 Hyperglycemia, unspecified: Secondary | ICD-10-CM | POA: Diagnosis not present

## 2023-10-06 DIAGNOSIS — D649 Anemia, unspecified: Secondary | ICD-10-CM

## 2023-10-06 DIAGNOSIS — N179 Acute kidney failure, unspecified: Secondary | ICD-10-CM | POA: Diagnosis present

## 2023-10-06 DIAGNOSIS — E785 Hyperlipidemia, unspecified: Secondary | ICD-10-CM | POA: Diagnosis present

## 2023-10-06 DIAGNOSIS — Z8249 Family history of ischemic heart disease and other diseases of the circulatory system: Secondary | ICD-10-CM

## 2023-10-06 DIAGNOSIS — J9601 Acute respiratory failure with hypoxia: Principal | ICD-10-CM | POA: Diagnosis present

## 2023-10-06 DIAGNOSIS — E872 Acidosis, unspecified: Secondary | ICD-10-CM | POA: Diagnosis present

## 2023-10-06 DIAGNOSIS — Z9221 Personal history of antineoplastic chemotherapy: Secondary | ICD-10-CM | POA: Diagnosis not present

## 2023-10-06 DIAGNOSIS — Z806 Family history of leukemia: Secondary | ICD-10-CM

## 2023-10-06 DIAGNOSIS — I255 Ischemic cardiomyopathy: Secondary | ICD-10-CM | POA: Diagnosis present

## 2023-10-06 DIAGNOSIS — D6481 Anemia due to antineoplastic chemotherapy: Secondary | ICD-10-CM | POA: Diagnosis present

## 2023-10-06 DIAGNOSIS — D61818 Other pancytopenia: Secondary | ICD-10-CM | POA: Diagnosis present

## 2023-10-06 DIAGNOSIS — M8580 Other specified disorders of bone density and structure, unspecified site: Secondary | ICD-10-CM | POA: Diagnosis present

## 2023-10-06 DIAGNOSIS — R54 Age-related physical debility: Secondary | ICD-10-CM | POA: Diagnosis present

## 2023-10-06 DIAGNOSIS — J810 Acute pulmonary edema: Secondary | ICD-10-CM

## 2023-10-06 DIAGNOSIS — I130 Hypertensive heart and chronic kidney disease with heart failure and stage 1 through stage 4 chronic kidney disease, or unspecified chronic kidney disease: Secondary | ICD-10-CM | POA: Diagnosis present

## 2023-10-06 DIAGNOSIS — B1910 Unspecified viral hepatitis B without hepatic coma: Secondary | ICD-10-CM | POA: Diagnosis present

## 2023-10-06 LAB — URINALYSIS, W/ REFLEX TO CULTURE (INFECTION SUSPECTED)
Bilirubin Urine: NEGATIVE
Glucose, UA: NEGATIVE mg/dL
Ketones, ur: NEGATIVE mg/dL
Leukocytes,Ua: NEGATIVE
Nitrite: NEGATIVE
Protein, ur: 100 mg/dL — AB
Specific Gravity, Urine: 1.019 (ref 1.005–1.030)
pH: 6 (ref 5.0–8.0)

## 2023-10-06 LAB — BASIC METABOLIC PANEL WITH GFR
Anion gap: 14 (ref 5–15)
Anion gap: 13 (ref 5–15)
BUN: 29 mg/dL — ABNORMAL HIGH (ref 8–23)
BUN: 31 mg/dL — ABNORMAL HIGH (ref 8–23)
CO2: 20 mmol/L — ABNORMAL LOW (ref 22–32)
CO2: 20 mmol/L — ABNORMAL LOW (ref 22–32)
Calcium: 8.4 mg/dL — ABNORMAL LOW (ref 8.9–10.3)
Calcium: 7.9 mg/dL — ABNORMAL LOW (ref 8.9–10.3)
Chloride: 105 mmol/L (ref 98–111)
Chloride: 107 mmol/L (ref 98–111)
Creatinine, Ser: 1.63 mg/dL — ABNORMAL HIGH (ref 0.44–1.00)
Creatinine, Ser: 2.07 mg/dL — ABNORMAL HIGH (ref 0.44–1.00)
GFR, Estimated: 31 mL/min — ABNORMAL LOW (ref >60)
GFR, Estimated: 23 mL/min — ABNORMAL LOW (ref >60)
Glucose, Bld: 124 mg/dL — ABNORMAL HIGH (ref 70–99)
Glucose, Bld: 118 mg/dL — ABNORMAL HIGH (ref 70–99)
Potassium: 3.7 mmol/L (ref 3.5–5.1)
Potassium: 4 mmol/L (ref 3.5–5.1)
Sodium: 139 mmol/L (ref 135–145)
Sodium: 140 mmol/L (ref 135–145)

## 2023-10-06 LAB — HEPATIC FUNCTION PANEL
ALT: 23 U/L (ref 0–44)
AST: 39 U/L (ref 15–41)
Albumin: 2.9 g/dL — ABNORMAL LOW (ref 3.5–5.0)
Alkaline Phosphatase: 108 U/L (ref 38–126)
Bilirubin, Direct: 0.4 mg/dL — ABNORMAL HIGH (ref 0.0–0.2)
Indirect Bilirubin: 0.9 mg/dL (ref 0.3–0.9)
Total Bilirubin: 1.3 mg/dL — ABNORMAL HIGH (ref 0.0–1.2)
Total Protein: 5.7 g/dL — ABNORMAL LOW (ref 6.5–8.1)

## 2023-10-06 LAB — I-STAT ARTERIAL BLOOD GAS, ED
Acid-base deficit: 7 mmol/L — ABNORMAL HIGH (ref 0.0–2.0)
Acid-base deficit: 4 mmol/L — ABNORMAL HIGH (ref 0.0–2.0)
Bicarbonate: 22.8 mmol/L (ref 20.0–28.0)
Bicarbonate: 22.6 mmol/L (ref 20.0–28.0)
Calcium, Ion: 1.16 mmol/L (ref 1.15–1.40)
Calcium, Ion: 1.15 mmol/L (ref 1.15–1.40)
HCT: 26 % — ABNORMAL LOW (ref 36.0–46.0)
HCT: 26 % — ABNORMAL LOW (ref 36.0–46.0)
Hemoglobin: 8.8 g/dL — ABNORMAL LOW (ref 12.0–15.0)
Hemoglobin: 8.8 g/dL — ABNORMAL LOW (ref 12.0–15.0)
O2 Saturation: 98 %
O2 Saturation: 98 %
Patient temperature: 99.2
Potassium: 3.6 mmol/L (ref 3.5–5.1)
Potassium: 3.1 mmol/L — ABNORMAL LOW (ref 3.5–5.1)
Sodium: 138 mmol/L (ref 135–145)
Sodium: 140 mmol/L (ref 135–145)
TCO2: 25 mmol/L (ref 22–32)
TCO2: 24 mmol/L (ref 22–32)
pCO2 arterial: 71.8 mmHg (ref 32–48)
pCO2 arterial: 50.6 mmHg — ABNORMAL HIGH (ref 32–48)
pH, Arterial: 7.11 — CL (ref 7.35–7.45)
pH, Arterial: 7.26 — ABNORMAL LOW (ref 7.35–7.45)
pO2, Arterial: 151 mmHg — ABNORMAL HIGH (ref 83–108)
pO2, Arterial: 127 mmHg — ABNORMAL HIGH (ref 83–108)

## 2023-10-06 LAB — GLUCOSE, CAPILLARY
Glucose-Capillary: 141 mg/dL — ABNORMAL HIGH (ref 70–99)
Glucose-Capillary: 126 mg/dL — ABNORMAL HIGH (ref 70–99)
Glucose-Capillary: 109 mg/dL — ABNORMAL HIGH (ref 70–99)
Glucose-Capillary: 113 mg/dL — ABNORMAL HIGH (ref 70–99)
Glucose-Capillary: 102 mg/dL — ABNORMAL HIGH (ref 70–99)

## 2023-10-06 LAB — BRAIN NATRIURETIC PEPTIDE: B Natriuretic Peptide: 1289.3 pg/mL — ABNORMAL HIGH (ref 0.0–100.0)

## 2023-10-06 LAB — URINALYSIS, ROUTINE W REFLEX MICROSCOPIC
Bacteria, UA: NONE SEEN
Bilirubin Urine: NEGATIVE
Glucose, UA: NEGATIVE mg/dL
Ketones, ur: NEGATIVE mg/dL
Leukocytes,Ua: NEGATIVE
Nitrite: NEGATIVE
Protein, ur: 100 mg/dL — AB
Specific Gravity, Urine: 1.019 (ref 1.005–1.030)
pH: 6 (ref 5.0–8.0)

## 2023-10-06 LAB — I-STAT CG4 LACTIC ACID, ED
Lactic Acid, Venous: 2.7 mmol/L (ref 0.5–1.9)
Lactic Acid, Venous: 2 mmol/L (ref 0.5–1.9)

## 2023-10-06 LAB — ECHOCARDIOGRAM LIMITED
Height: 59.016 in
S' Lateral: 4 cm
Weight: 2338.64 [oz_av]

## 2023-10-06 LAB — TROPONIN I (HIGH SENSITIVITY)
Troponin I (High Sensitivity): 63 ng/L — ABNORMAL HIGH (ref <18)
Troponin I (High Sensitivity): 343 ng/L (ref <18)
Troponin I (High Sensitivity): 1163 ng/L (ref <18)
Troponin I (High Sensitivity): 1186 ng/L (ref <18)

## 2023-10-06 LAB — CBC
HCT: 28.3 % — ABNORMAL LOW (ref 36.0–46.0)
HCT: 30.1 % — ABNORMAL LOW (ref 36.0–46.0)
Hemoglobin: 9 g/dL — ABNORMAL LOW (ref 12.0–15.0)
Hemoglobin: 9.2 g/dL — ABNORMAL LOW (ref 12.0–15.0)
MCH: 28.8 pg (ref 26.0–34.0)
MCH: 28.5 pg (ref 26.0–34.0)
MCHC: 31.8 g/dL (ref 30.0–36.0)
MCHC: 30.6 g/dL (ref 30.0–36.0)
MCV: 90.4 fL (ref 80.0–100.0)
MCV: 93.2 fL (ref 80.0–100.0)
Platelets: 235 K/uL (ref 150–400)
Platelets: 187 K/uL (ref 150–400)
RBC: 3.13 MIL/uL — ABNORMAL LOW (ref 3.87–5.11)
RBC: 3.23 MIL/uL — ABNORMAL LOW (ref 3.87–5.11)
RDW: 15.2 % (ref 11.5–15.5)
RDW: 15.3 % (ref 11.5–15.5)
WBC: 26 K/uL — ABNORMAL HIGH (ref 4.0–10.5)
WBC: 21.5 K/uL — ABNORMAL HIGH (ref 4.0–10.5)
nRBC: 0 % (ref 0.0–0.2)
nRBC: 0 % (ref 0.0–0.2)

## 2023-10-06 LAB — PROTIME-INR
INR: 1.2 (ref 0.8–1.2)
Prothrombin Time: 16.3 s — ABNORMAL HIGH (ref 11.4–15.2)

## 2023-10-06 LAB — MRSA NEXT GEN BY PCR, NASAL: MRSA by PCR Next Gen: DETECTED — AB

## 2023-10-06 LAB — RESP PANEL BY RT-PCR (RSV, FLU A&B, COVID)  RVPGX2
Influenza A by PCR: NEGATIVE
Influenza B by PCR: NEGATIVE
Resp Syncytial Virus by PCR: NEGATIVE
SARS Coronavirus 2 by RT PCR: NEGATIVE

## 2023-10-06 LAB — LACTIC ACID, PLASMA
Lactic Acid, Venous: 2.3 mmol/L (ref 0.5–1.9)
Lactic Acid, Venous: 2 mmol/L (ref 0.5–1.9)

## 2023-10-06 LAB — CREATININE, SERUM
Creatinine, Ser: 1.88 mg/dL — ABNORMAL HIGH (ref 0.44–1.00)
GFR, Estimated: 26 mL/min — ABNORMAL LOW (ref >60)

## 2023-10-06 LAB — PROCALCITONIN: Procalcitonin: 7.35 ng/mL

## 2023-10-06 MED ORDER — DOCUSATE SODIUM 50 MG/5ML PO LIQD: 100.0000 mg | Freq: Two times a day (BID) | ORAL | Status: DC | PRN

## 2023-10-06 MED ORDER — CHLORHEXIDINE GLUCONATE CLOTH 2 % EX PADS
6.0000 | MEDICATED_PAD | Freq: Every day | CUTANEOUS | Status: DC
  Administered 2023-10-06 – 2023-10-12 (×7): 6 via TOPICAL

## 2023-10-06 MED ORDER — ORAL CARE MOUTH RINSE: 15.0000 mL | OROMUCOSAL | Status: DC | PRN

## 2023-10-06 MED ORDER — POTASSIUM CHLORIDE 10 MEQ/100ML IV SOLN
10.0000 meq | INTRAVENOUS | Status: AC
  Administered 2023-10-06 (×4): 10 meq via INTRAVENOUS
  Filled 2023-10-06 (×4): qty 100

## 2023-10-06 MED ORDER — PROPOFOL 1000 MG/100ML IV EMUL
INTRAVENOUS | Status: AC
  Administered 2023-10-06: 10 ug/kg/min via INTRAVENOUS
  Filled 2023-10-06: qty 100

## 2023-10-06 MED ORDER — FUROSEMIDE 10 MG/ML IJ SOLN
20.0000 mg | Freq: Once | INTRAMUSCULAR | Status: AC
  Administered 2023-10-06: 20 mg via INTRAVENOUS
  Filled 2023-10-06: qty 2

## 2023-10-06 MED ORDER — POTASSIUM CHLORIDE 10 MEQ/100ML IV SOLN: 10.0000 meq | INTRAVENOUS | Status: DC

## 2023-10-06 MED ORDER — VANCOMYCIN HCL 1250 MG/250ML IV SOLN
1250.0000 mg | Freq: Once | INTRAVENOUS | Status: AC
  Administered 2023-10-06: 1250 mg via INTRAVENOUS
  Filled 2023-10-06 (×2): qty 250

## 2023-10-06 MED ORDER — PROPOFOL 1000 MG/100ML IV EMUL
0.0000 ug/kg/min | INTRAVENOUS | Status: DC
  Administered 2023-10-06: 15 ug/kg/min via INTRAVENOUS
  Filled 2023-10-06: qty 100

## 2023-10-06 MED ORDER — SODIUM CHLORIDE 0.9% FLUSH
10.0000 mL | Freq: Two times a day (BID) | INTRAVENOUS | Status: DC
  Administered 2023-10-06 – 2023-10-12 (×11): 10 mL

## 2023-10-06 MED ORDER — HEPARIN SODIUM (PORCINE) 5000 UNIT/ML IJ SOLN
5000.0000 [IU] | Freq: Three times a day (TID) | INTRAMUSCULAR | Status: DC
  Administered 2023-10-06: 5000 [IU] via SUBCUTANEOUS
  Filled 2023-10-06: qty 1

## 2023-10-06 MED ORDER — FUROSEMIDE 10 MG/ML IJ SOLN
12.0000 mg/h | INTRAVENOUS | Status: DC
  Administered 2023-10-06: 12 mg/h via INTRAVENOUS
  Filled 2023-10-06 (×2): qty 20

## 2023-10-06 MED ORDER — ORAL CARE MOUTH RINSE
15.0000 mL | OROMUCOSAL | Status: DC
  Administered 2023-10-06 – 2023-10-07 (×12): 15 mL via OROMUCOSAL

## 2023-10-06 MED ORDER — HEPARIN (PORCINE) 25000 UT/250ML-% IV SOLN
800.0000 [IU]/h | INTRAVENOUS | Status: DC
  Administered 2023-10-06: 650 [IU]/h via INTRAVENOUS
  Administered 2023-10-07: 800 [IU]/h via INTRAVENOUS
  Filled 2023-10-06 (×2): qty 250

## 2023-10-06 MED ORDER — ETOMIDATE 2 MG/ML IV SOLN
INTRAVENOUS | Status: AC | PRN
  Administered 2023-10-06: 10 mg via INTRAVENOUS

## 2023-10-06 MED ORDER — IOHEXOL 350 MG/ML SOLN
75.0000 mL | Freq: Once | INTRAVENOUS | Status: AC | PRN
Start: 2023-10-06 — End: 2023-10-06
  Administered 2023-10-06: 75 mL via INTRAVENOUS

## 2023-10-06 MED ORDER — SODIUM CHLORIDE 0.9% FLUSH: 10.0000 mL | INTRAVENOUS | Status: DC | PRN

## 2023-10-06 MED ORDER — FUROSEMIDE 10 MG/ML IJ SOLN
40.0000 mg | Freq: Once | INTRAMUSCULAR | Status: AC
  Administered 2023-10-06: 40 mg via INTRAVENOUS
  Filled 2023-10-06: qty 4

## 2023-10-06 MED ORDER — FENTANYL NICU BOLUS VIA INFUSION: 25.0000 ug | INTRAVENOUS | Status: DC | PRN

## 2023-10-06 MED ORDER — FENTANYL 2500MCG IN NS 250ML (10MCG/ML) PREMIX INFUSION
INTRAVENOUS | Status: AC
  Administered 2023-10-06: 25 ug/h via INTRAVENOUS
  Filled 2023-10-06: qty 250

## 2023-10-06 MED ORDER — LABETALOL HCL 5 MG/ML IV SOLN
10.0000 mg | Freq: Once | INTRAVENOUS | Status: AC
  Administered 2023-10-06: 10 mg via INTRAVENOUS
  Filled 2023-10-06: qty 4

## 2023-10-06 MED ORDER — POLYETHYLENE GLYCOL 3350 17 G PO PACK
17.0000 g | PACK | Freq: Every day | ORAL | Status: DC | PRN
Start: 2023-10-06 — End: 2023-10-12

## 2023-10-06 MED ORDER — INSULIN ASPART 100 UNIT/ML IJ SOLN
0.0000 [IU] | INTRAMUSCULAR | Status: DC
  Administered 2023-10-06: 1 [IU] via SUBCUTANEOUS

## 2023-10-06 MED ORDER — ROCURONIUM BROMIDE 10 MG/ML (PF) SYRINGE
PREFILLED_SYRINGE | INTRAVENOUS | Status: AC | PRN
  Administered 2023-10-06: 80 mg via INTRAVENOUS

## 2023-10-06 MED ORDER — HEPARIN BOLUS VIA INFUSION
3000.0000 [IU] | Freq: Once | INTRAVENOUS | Status: AC
  Administered 2023-10-06: 3000 [IU] via INTRAVENOUS
  Filled 2023-10-06: qty 3000

## 2023-10-06 MED ORDER — SODIUM CHLORIDE 0.9 % IV SOLN
2.0000 g | INTRAVENOUS | Status: DC
  Administered 2023-10-07 – 2023-10-10 (×4): 2 g via INTRAVENOUS
  Filled 2023-10-06 (×4): qty 12.5

## 2023-10-06 MED ORDER — VANCOMYCIN HCL IN DEXTROSE 1-5 GM/200ML-% IV SOLN
1000.0000 mg | INTRAVENOUS | Status: DC
  Administered 2023-10-08 – 2023-10-10 (×2): 1000 mg via INTRAVENOUS
  Filled 2023-10-06 (×2): qty 200

## 2023-10-06 MED ORDER — FAMOTIDINE 20 MG PO TABS
20.0000 mg | ORAL_TABLET | Freq: Two times a day (BID) | ORAL | Status: DC
  Administered 2023-10-06: 20 mg
  Filled 2023-10-06: qty 1

## 2023-10-06 MED ORDER — FUROSEMIDE 10 MG/ML IJ SOLN
80.0000 mg | Freq: Once | INTRAMUSCULAR | Status: AC
  Administered 2023-10-06: 80 mg via INTRAVENOUS
  Filled 2023-10-06: qty 8

## 2023-10-06 MED ORDER — SODIUM CHLORIDE 0.9 % IV SOLN
2.0000 g | Freq: Once | INTRAVENOUS | Status: AC
  Administered 2023-10-06: 2 g via INTRAVENOUS
  Filled 2023-10-06: qty 12.5

## 2023-10-06 MED ORDER — FAMOTIDINE 20 MG PO TABS: 10.0000 mg | ORAL_TABLET | Freq: Every day | ORAL | Status: DC

## 2023-10-06 MED ORDER — FENTANYL 2500MCG IN NS 250ML (10MCG/ML) PREMIX INFUSION: 0.0000 ug/h | INTRAVENOUS | Status: DC

## 2023-10-06 MED ORDER — MUPIROCIN 2 % EX OINT
1.0000 | TOPICAL_OINTMENT | Freq: Two times a day (BID) | CUTANEOUS | Status: AC
  Administered 2023-10-06 – 2023-10-10 (×10): 1 via NASAL
  Filled 2023-10-06 (×2): qty 22

## 2023-10-06 NOTE — ED Notes (Signed)
 Roomed pt, placed pt on heart monitor,and pulse ox

## 2023-10-06 NOTE — Progress Notes (Signed)
 Transported pt from ED TRAC to 2M08 with bedside RN. Vitals are stable.

## 2023-10-06 NOTE — Consult Note (Addendum)
 CARDIOLOGY CONSULT NOTE    Patient ID: Jacqueline Hancock; [id number]; Aug 12, 1940   Admit date: 10/06/2023 Date of Consult: 10/06/2023  Primary Care Provider: Leonarda Roxan BROCKS, NP Primary Cardiologist:  Primary Electrophysiologist:    History of Present Illness:   Ms. 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  Chest pain lasted until she arrived to the ER, according to the son.  Due to acute hypoxic and hypercapnic respiratory failure and increased work of breathing, she was intubated this morning at 7 AM.  She had imaging evidence of acute flash pulmonary edema, she received IV Lasix  120 mg so far since this a.m.  She also has leg swelling.  Son denied her having any chest pains before.  No prior MI.  No prior ischemia evaluation.  Echo from May 2025 showed normal LVEF, normal RV function, mild to moderate MR, trivial AI and CVP 3 mmHg.  Limited echocardiogram obtained today showed LVEF 25 to 30% with RWMA and CVP 3 mmHg.  EKG showed sinus tachycardia, no ischemia. Hs troponin elevated, 63>>343>>1186.  BNP elevated, 1289.  Serum creatinine increased from 1.6 in the ER to 2.07 upon arrival to the ICU.  CO2 20, total bilirubin 1.3 in the ER, not checked in the ICU.  Past Medical History:  Diagnosis Date   CLL (chronic lymphocytic leukemia) (HCC)    Dyspnea 2021   with exersion    Early cataracts, bilateral    Elevated hemoglobin A1c    Fibroids    GERD (gastroesophageal reflux disease)    Hyperlipidemia    Hypertension    Lymphoma (HCC)    Osteopenia     Past Surgical History:  Procedure Laterality Date   ABDOMINAL HYSTERECTOMY     APPENDECTOMY     BALLOON DILATION N/A 08/31/2023   Procedure: BALLOON DILATION;  Surgeon: Charlanne Groom, MD;  Location: [facility];  Service: Gastroenterology;  Laterality: N/A;   CATARACT EXTRACTION, BILATERAL Bilateral 09/2017   Dr.  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   TONSILLECTOMY AND ADENOIDECTOMY         Inpatient Medications: Scheduled Meds:  Chlorhexidine  Gluconate Cloth  6 each Topical Daily   [START ON 10/07/2023] famotidine   10 mg Per Tube Daily   heparin   5,000 Units Subcutaneous Q8H   insulin  aspart  0-9 Units Subcutaneous Q4H   mupirocin  ointment  1 Application Nasal BID   mouth rinse  15 mL Mouth Rinse Q2H   Continuous Infusions:  [START ON 10/07/2023] ceFEPime  (MAXIPIME ) IV     fentaNYL  infusion INTRAVENOUS 25 mcg/hr (10/06/23 1549)   propofol  (DIPRIVAN ) infusion 15 mcg/kg/min (10/06/23 1700)   [START ON 10/08/2023] vancomycin      PRN Meds: docusate, fentaNYL , mouth rinse, polyethylene glycol  Allergies:    Allergies  Allergen Reactions   Nexium [Esomeprazole Magnesium ] Other (See Comments)    Headache   Peanut Butter Flavoring Agent (Non-Screening) Itching    Social History:   Social History   Socioeconomic History   Marital status: Widowed    Spouse name: Not on file   Number of children: 3   Years of education: Not  on file   Highest education level: Not on file  Occupational History   Occupation: retired  Tobacco Use   Smoking status: Never   Smokeless tobacco: Never  Vaping Use   Vaping status: Never Used  Substance and Sexual Activity   Alcohol use: No   Drug use: Never   Sexual activity: Not on file  Other Topics Concern   Not on file  Social History Narrative   Not on file   Social Drivers of Health   Financial Resource Strain: Not on file  Food Insecurity: No Food Insecurity (10/06/2023)   Hunger Vital Sign    Worried About Running Out of Food in the Last Year: Never true    Ran Out of Food in the Last Year: Never  true  Transportation Needs: No Transportation Needs (10/06/2023)   PRAPARE - Administrator, Civil Service (Medical): No    Lack of Transportation (Non-Medical): No  Physical Activity: Not on file  Stress: Not on file  Social Connections: Moderately Integrated (10/06/2023)   Social Connection and Isolation Panel    Frequency of Communication with Friends and Family: More than three times a week    Frequency of Social Gatherings with Friends and Family: Three times a week    Attends Religious Services: More than 4 times per year    Active Member of Clubs or Organizations: Not on file    Attends Club or Organization Meetings: More than 4 times per year    Marital Status: Widowed  Intimate Partner Violence: Patient Unable To Answer (10/06/2023)   Humiliation, Afraid, Rape, and Kick questionnaire    Fear of Current or Ex-Partner: Patient unable to answer    Emotionally Abused: Patient unable to answer    Physically Abused: Patient unable to answer    Sexually Abused: Patient unable to answer    Family History:    Family History  Problem Relation Age of Onset   Leukemia Mother    Hypertension Father    Diabetes Sister    Heart disease Brother    Diabetes Brother    Diabetes Brother    Hypertension Son    Heart disease Son      ROS:  Please see the history of present illness.  ROS  All other ROS reviewed and negative.     Physical Exam/Data:   Vitals:   10/06/23 1500 10/06/23 1518 10/06/23 1600 10/06/23 1700  BP: 107/67  113/69 113/69  Pulse: 85 85 87 83  Resp: (!) 22 (!) 22 (!) 22 (!) 22  Temp: 98.4 F (36.9 C) 98.6 F (37 C) 98.6 F (37 C) 98.6 F (37 C)  TempSrc:      SpO2: 99% 99% 100% 99%  Weight:      Height:  4' 11.02 (1.499 m)      Intake/Output Summary (Last 24 hours) at 10/06/2023 1726 Last data filed at 10/06/2023 1700 Gross per 24 hour  Intake 802.79 ml  Output 550 ml  Net 252.79 ml   Filed Weights   10/06/23 1048  Weight: 66.3 kg   Body  mass index is 29.51 kg/m.  General:  Well nourished, well developed, in no acute distress HEENT: normal Lymph: no adenopathy Neck: Not examined, intubated Endocrine:  No thryomegaly Vascular: No carotid bruits; FA pulses 2+ bilaterally without bruits  Cardiac:  normal S1, S2; RRR; no murmur  Lungs: Bilateral coarse Rales present. Abd: soft, nontender, no hepatomegaly  Ext: no edema Musculoskeletal:  No deformities, BUE  and BLE strength normal and equal Skin: warm and dry  Neuro:  CNs 2-12 intact, no focal abnormalities noted Psych:  Normal affect   EKG:  The EKG was personally reviewed and demonstrates: Sinus tachycardia Telemetry:  Telemetry was personally reviewed and demonstrates: NSR  Relevant CV Studies:   Laboratory Data:  Chemistry Recent Labs  Lab 10/04/23 1100 10/06/23 0416 10/06/23 0605 10/06/23 0816 10/06/23 0845 10/06/23 1612  NA 141 139 138  --  140 140  K 3.6 3.7 3.6  --  3.1* 4.0  CL 103 105  --   --   --  107  CO2 29 20*  --   --   --  20*  GLUCOSE 103* 124*  --   --   --  118*  BUN 24* 29*  --   --   --  31*  CREATININE 1.61* 1.63*  --  1.88*  --  2.07*  CALCIUM 8.5* 8.4*  --   --   --  7.9*  GFRNONAA 32* 31*  --  26*  --  23*  ANIONGAP 9 14  --   --   --  13    Recent Labs  Lab 10/04/23 1100 10/06/23 0416  PROT 5.8* 5.7*  ALBUMIN  3.4* 2.9*  AST 16 39  ALT 11 23  ALKPHOS 109 108  BILITOT 0.7 1.3*   Hematology Recent Labs  Lab 10/04/23 1100 10/04/23 1100 10/06/23 0416 10/06/23 0605 10/06/23 0816 10/06/23 0845  WBC 13.7*  --  26.0*  --  21.5*  --   RBC 3.19*  --  3.13*  --  3.23*  --   HGB 9.0*   < > 9.0* 8.8* 9.2* 8.8*  HCT 28.3*  --  28.3* 26.0* 30.1* 26.0*  MCV 88.7  --  90.4  --  93.2  --   MCH 28.2  --  28.8  --  28.5  --   MCHC 31.8  --  31.8  --  30.6  --   RDW 15.1  --  15.2  --  15.3  --   PLT 147*  --  235  --  187  --    < > = values in this interval not displayed.   Cardiac EnzymesNo results for input(s):  TROPONINI in the last 168 hours. No results for input(s): TROPIPOC in the last 168 hours.  BNP Recent Labs  Lab 10/06/23 0416  BNP 1,289.3*    DDimer No results for input(s): DDIMER in the last 168 hours.  Radiology/Studies:  ECHOCARDIOGRAM LIMITED Result Date: 10/06/2023    ECHOCARDIOGRAM LIMITED REPORT   Patient Name:   Jacqueline Hancock Date of Exam: 10/06/2023 Medical Rec #:  [id number]   Height:       59.0 in Accession #:    [id number]  Weight:       146.2 lb Date of Birth:  [birth year]   BSA:          1.614 m Patient Age:    83 years    BP:           187/68 mmHg Patient Gender: F           HR:           87 bpm. Exam Location:  Inpatient Procedure: Limited Echo (Both Spectral and Color Flow Doppler were utilized            during procedure). Indications:    Flash pulmonary edema (HCC)  History:  Patient has prior history of Echocardiogram examinations, most                 recent 08/22/2023. Signs/Symptoms:Dyspnea; Risk                 Factors:Hypertension.  Sonographer:    Jayson Gaskins Referring Phys: [phone] WHITNEY D HARRIS IMPRESSIONS  1. Limited Echocardiogram.  2. The left ventricle demonstrates both global and regional wall motion abnormalities (see scoring diagram/findings for description). The apex is akinetic. The entire lateral wall, anterior septum, and basal inferior segment are hypokinetic. The inferior septum and mid and distal inferior wall are normal. . Left ventricular ejection fraction, by estimation, is 25 to 30%. The left ventricle has severely decreased function. Left ventricular diastolic function could not be evaluated.  3. The mitral valve is grossly normal.  4. The aortic valve was not well visualized. Aortic valve regurgitation is not visualized. Aortic valve sclerosis is present, with no evidence of aortic valve stenosis.  5. The inferior vena cava is normal in size with greater than 50% respiratory variability, suggesting right atrial pressure of 3 mmHg. Comparison(s): A  prior study was performed on 08/22/2023. LVEF has decreased (from 60-65% to 25-30%). FINDINGS  Left Ventricle: The left ventricle demonstrates both global and regional wall motion abnormalities (see scoring diagram/findings for description). The apex is akinetic. The entire lateral wall, anterior septum, and basal inferior segment are hypokinetic. The inferior septum and mid and distal inferior wall are normal. Left ventricular ejection fraction, by estimation, is 25 to 30%. The left ventricle has severely decreased function. The left ventricular internal cavity size was normal in size. There is no left ventricular hypertrophy. Left ventricular diastolic function could not be evaluated. Left ventricular diastolic function could not be evaluated due to nondiagnostic images.  LV Wall Scoring: The apex is akinetic. The entire lateral wall, anterior septum, and basal inferior segment are hypokinetic. The inferior septum and mid and distal inferior wall are normal. Pericardium: There is no evidence of pericardial effusion. Mitral Valve: The mitral valve is grossly normal. Tricuspid Valve: The tricuspid valve is grossly normal. Aortic Valve: The aortic valve was not well visualized. Aortic valve regurgitation is not visualized. Aortic valve sclerosis is present, with no evidence of aortic valve stenosis. Venous: The inferior vena cava is normal in size with greater than 50% respiratory variability, suggesting right atrial pressure of 3 mmHg. LEFT VENTRICLE PLAX 2D LVIDd:         4.40 cm LVIDs:         4.00 cm LV PW:         0.90 cm LV IVS:        0.90 cm LVOT diam:     1.70 cm LVOT Area:     2.27 cm  LEFT ATRIUM           Index        RIGHT ATRIUM          Index LA Vol (A4C): 33.4 ml 20.69 ml/m  RA Area:     5.84 cm                                    RA Volume:   7.51 ml  4.65 ml/m   SHUNTS Systemic Diam: 1.70 cm Sunit Tolia Electronically signed by Madonna Large Signature Date/Time: 10/06/2023/4:27:49 PM    Final    DG  Chest Portable 1  View Result Date: 10/06/2023 CLINICAL DATA:  83 year old female intubated, enteric tube placement. EXAM: PORTABLE CHEST 1 VIEW COMPARISON:  0426 hours today, CTA chest today 10/06/2023. FINDINGS: Portable AP supine view at 0655 hours. Rotated to the right now. Endotracheal tube tip in good position between the clavicles and carina. Enteric tube courses to the left abdomen, tip not included. Stable right chest power port. Mildly lower lung volumes. Stable cardiac size and mediastinal contours. Increased mid and lower lung vague and interstitial opacity, symmetric. No pneumothorax or pleural effusion identified on this supine view. Negative visible bowel gas. Stable visualized osseous structures. IMPRESSION: 1. Satisfactory ET tube and Enteric tube. 2. Worsening symmetric perihilar and lower lung vague and interstitial opacity, CTA appearance today suggesting acute pulmonary edema (please see that report). Electronically Signed   By: VEAR Hurst M.D.   On: 10/06/2023 07:26   DG Chest Port 1 View Result Date: 10/06/2023 CLINICAL DATA:  Increasing central chest pain with shortness of breath. EXAM: PORTABLE CHEST 1 VIEW COMPARISON:  08/28/2023 FINDINGS: Cardiopericardial silhouette is at upper limits of normal for size. There is pulmonary vascular congestion without overt pulmonary edema. Component of interstitial pulmonary edema suspected. Tiny right pleural effusion evident. Right Port-A-Cath remains in place. IMPRESSION: Pulmonary vascular congestion with component of interstitial pulmonary edema and tiny right pleural effusion. Electronically Signed   By: Camellia Candle M.D.   On: 10/06/2023 06:17   CT Angio Chest Pulmonary Embolism (PE) W or WO Contrast Result Date: 10/06/2023 CLINICAL DATA:  Bowel obstruction suspected PE suspected, respiratory distress, query worsening tumor burden lymphoma * Tracking Code: BO * EXAM: CT ANGIOGRAPHY CHEST CT ABDOMEN AND PELVIS WITH CONTRAST TECHNIQUE: Multidetector  CT imaging of the chest was performed using the standard protocol during bolus administration of intravenous contrast. Multiplanar CT image reconstructions and MIPs were obtained to evaluate the vascular anatomy. Multidetector CT imaging of the abdomen and pelvis was performed using the standard protocol during bolus administration of intravenous contrast. RADIATION DOSE REDUCTION: This exam was performed according to the departmental dose-optimization program which includes automated exposure control, adjustment of the mA and/or kV according to patient size and/or use of iterative reconstruction technique. CONTRAST:  75mL OMNIPAQUE  IOHEXOL  350 MG/ML SOLN COMPARISON:  CT abdomen pelvis, 08/28/2023 PET-CT, 07/17/2018 FINDINGS: CT CHEST ANGIOGRAM FINDINGS Cardiovascular: Satisfactory opacification of the pulmonary arteries to the segmental level. No evidence of pulmonary embolism. Normal heart size. No pericardial effusion. Right chest port catheter. Aortic atherosclerosis. Mediastinum/Nodes: No enlarged mediastinal, hilar, or axillary lymph nodes. Thyroid  gland, trachea, and esophagus demonstrate no significant findings. Lungs/Pleura: Diffuse bilateral bronchial wall thickening, interlobular septal thickening, diffusely scattered ground-glass airspace opacity and small bilateral pleural effusions. Musculoskeletal: No chest wall abnormality. No acute osseous findings. Review of the MIP images confirms the above findings. CT ABDOMEN PELVIS FINDINGS Hepatobiliary: No solid liver abnormality is seen. No gallstones, gallbladder wall thickening, or biliary dilatation. Pancreas: Unremarkable. No pancreatic ductal dilatation or surrounding inflammatory changes. Spleen: Normal in size without significant abnormality. Adrenals/Urinary Tract: Adrenal glands are unremarkable. Extremely bulky soft tissue mass again encases the right kidney, not significantly changed, measuring 12.9 x 10.0 cm (series 4, image 44). Dilated right  renal calices with overt effacement of the right renal pelvis. Normal left kidney. Bladder is unremarkable. Stomach/Bowel: Stomach is within normal limits. Appendix appears normal. No evidence of bowel wall thickening, distention, or inflammatory changes. Vascular/Lymphatic: Aortic atherosclerosis. No discretely enlarged abdominal or pelvic lymph nodes. Reproductive: No mass or other significant abnormality. Other: No  abdominal wall hernia. Severe anasarca. Small volume ascites. Musculoskeletal: No acute or significant osseous findings. IMPRESSION: 1. Negative examination for pulmonary embolism. 2. Diffuse bilateral bronchial wall thickening, interlobular septal thickening, diffusely scattered ground-glass airspace opacity and small bilateral pleural effusions. Findings consistent with pulmonary edema. Drug toxicity is a differential consideration in the setting of chemotherapy. 3. Extremely bulky soft tissue mass again encases the right kidney, not significantly changed, measuring 12.9 x 10.0 cm. Dilated right renal calices with overt effacement of the right renal pelvis. 4. Severe anasarca.  Small volume ascites. Aortic Atherosclerosis (ICD10-I70.0). Electronically Signed   By: Marolyn JONETTA Jaksch M.D.   On: 10/06/2023 05:52   CT ABDOMEN PELVIS W CONTRAST Result Date: 10/06/2023 CLINICAL DATA:  Bowel obstruction suspected PE suspected, respiratory distress, query worsening tumor burden lymphoma * Tracking Code: BO * EXAM: CT ANGIOGRAPHY CHEST CT ABDOMEN AND PELVIS WITH CONTRAST TECHNIQUE: Multidetector CT imaging of the chest was performed using the standard protocol during bolus administration of intravenous contrast. Multiplanar CT image reconstructions and MIPs were obtained to evaluate the vascular anatomy. Multidetector CT imaging of the abdomen and pelvis was performed using the standard protocol during bolus administration of intravenous contrast. RADIATION DOSE REDUCTION: This exam was performed according to  the departmental dose-optimization program which includes automated exposure control, adjustment of the mA and/or kV according to patient size and/or use of iterative reconstruction technique. CONTRAST:  75mL OMNIPAQUE  IOHEXOL  350 MG/ML SOLN COMPARISON:  CT abdomen pelvis, 08/28/2023 PET-CT, 07/17/2018 FINDINGS: CT CHEST ANGIOGRAM FINDINGS Cardiovascular: Satisfactory opacification of the pulmonary arteries to the segmental level. No evidence of pulmonary embolism. Normal heart size. No pericardial effusion. Right chest port catheter. Aortic atherosclerosis. Mediastinum/Nodes: No enlarged mediastinal, hilar, or axillary lymph nodes. Thyroid  gland, trachea, and esophagus demonstrate no significant findings. Lungs/Pleura: Diffuse bilateral bronchial wall thickening, interlobular septal thickening, diffusely scattered ground-glass airspace opacity and small bilateral pleural effusions. Musculoskeletal: No chest wall abnormality. No acute osseous findings. Review of the MIP images confirms the above findings. CT ABDOMEN PELVIS FINDINGS Hepatobiliary: No solid liver abnormality is seen. No gallstones, gallbladder wall thickening, or biliary dilatation. Pancreas: Unremarkable. No pancreatic ductal dilatation or surrounding inflammatory changes. Spleen: Normal in size without significant abnormality. Adrenals/Urinary Tract: Adrenal glands are unremarkable. Extremely bulky soft tissue mass again encases the right kidney, not significantly changed, measuring 12.9 x 10.0 cm (series 4, image 44). Dilated right renal calices with overt effacement of the right renal pelvis. Normal left kidney. Bladder is unremarkable. Stomach/Bowel: Stomach is within normal limits. Appendix appears normal. No evidence of bowel wall thickening, distention, or inflammatory changes. Vascular/Lymphatic: Aortic atherosclerosis. No discretely enlarged abdominal or pelvic lymph nodes. Reproductive: No mass or other significant abnormality. Other: No  abdominal wall hernia. Severe anasarca. Small volume ascites. Musculoskeletal: No acute or significant osseous findings. IMPRESSION: 1. Negative examination for pulmonary embolism. 2. Diffuse bilateral bronchial wall thickening, interlobular septal thickening, diffusely scattered ground-glass airspace opacity and small bilateral pleural effusions. Findings consistent with pulmonary edema. Drug toxicity is a differential consideration in the setting of chemotherapy. 3. Extremely bulky soft tissue mass again encases the right kidney, not significantly changed, measuring 12.9 x 10.0 cm. Dilated right renal calices with overt effacement of the right renal pelvis. 4. Severe anasarca.  Small volume ascites. Aortic Atherosclerosis (ICD10-I70.0). Electronically Signed   By: Marolyn JONETTA Jaksch M.D.   On: 10/06/2023 05:52    Assessment and Plan:   NSTEMI c/w ADHF: Presented with chest pain and SOB, intubated this a.m.  EKG showed  sinus tachycardia, no ischemia. Hs troponin elevated, 63>>343>>1186.  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  Needs invasive ischemia evaluation after extubation and stable. Start ACS protocol.  Acute hypoxic respiratory failure secondary to flash pulmonary edema: Intubated now, presented with chest pains and SOB since this a.m. Echo from May 2025 showed normal LVEF, normal RV function, mild to moderate MR, trivial AI and CVP 3 mmHg.  Limited echocardiogram obtained today showed LVEF 25 to 30% with RWMA and CVP 3 mmHg.  Imaging evidence of flash pulmonary edema/pulmonary vascular congestion, severe anasarca, small ascites and small bilateral pleural effusions present. BNP elevated, 1289.  Serum creatinine increased from 1.6 in the ER to 2.07 upon arrival to the ICU.  CO2 20, total bilirubin 1.3 in the ER, not checked in the ICU.  Received IV Lasix  120 mg so far, last dose at 3 PM today.  Start IV Lasix  drip at 12 mg/h. Keep K > 4 and <5, Mg> 2 and <3.  Acute systolic  heart failure: Plan as above.  On chemotherapy for lymphoma.  Need to check cardiotoxicity.  Will not substitute ischemia evaluation.   AKI, likely cardiorenal: Plan as above.  Lactic acidosis: Likely secondary to ADHF, plan as above.  Repeat serum lactic acid.  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  Currently on IV antibiotics that was started by ICU empirically.  CRITICAL CARE Performed by: Diannah SQUIBB Javyon Fontan   Total critical care time: 30 minutes  Critical care time was exclusive of separately billable procedures and treating other patients.  Critical care was necessary to treat or prevent imminent or life-threatening deterioration.  Critical care was time spent personally by me on the following activities: development of treatment plan with patient and/or surrogate as well as nursing, discussions with consultants, evaluation of patient's response to treatment, examination of patient, obtaining history from patient or surrogate, ordering and performing treatments and interventions, ordering and review of laboratory studies, ordering and review of radiographic studies, pulse oximetry and re-evaluation of patient's condition.   For questions or updates, please contact [facility] Please consult [email or web address] for contact info under Cardiology/STEMI.   Signed, Shanaiya Bene Priya Johnatan Baskette, MD 10/06/2023 5:26 PM

## 2023-10-06 NOTE — Code Documentation (Signed)
 Ett 23 at the lip

## 2023-10-06 NOTE — Progress Notes (Signed)
 Elink monitoring for the code sepsis protocol.

## 2023-10-06 NOTE — Plan of Care (Signed)
 Intubated, FC, lasix  given x2. Problem: Education: Goal: Knowledge of General Education information will improve Description: Including pain rating scale, medication(s)/side effects and non-pharmacologic comfort measures Outcome: Progressing   Problem: Health Behavior/Discharge Planning: Goal: Ability to manage health-related needs will improve Outcome: Progressing   Problem: Clinical Measurements: Goal: Ability to maintain clinical measurements within normal limits will improve Outcome: Progressing Goal: Will remain free from infection Outcome: Progressing Goal: Diagnostic test results will improve Outcome: Progressing Goal: Respiratory complications will improve Outcome: Progressing Goal: Cardiovascular complication will be avoided Outcome: Progressing   Problem: Activity: Goal: Risk for activity intolerance will decrease Outcome: Progressing   Problem: Nutrition: Goal: Adequate nutrition will be maintained Outcome: Progressing   Problem: Coping: Goal: Level of anxiety will decrease Outcome: Progressing   Problem: Elimination: Goal: Will not experience complications related to bowel motility Outcome: Progressing Goal: Will not experience complications related to urinary retention Outcome: Progressing   Problem: Pain Managment: Goal: General experience of comfort will improve and/or be controlled Outcome: Progressing   Problem: Safety: Goal: Ability to remain free from injury will improve Outcome: Progressing   Problem: Skin Integrity: Goal: Risk for impaired skin integrity will decrease Outcome: Progressing   Problem: Education: Goal: Ability to describe self-care measures that may prevent or decrease complications (Diabetes Survival Skills Education) will improve Outcome: Progressing Goal: Individualized Educational Video(s) Outcome: Progressing   Problem: Coping: Goal: Ability to adjust to condition or change in health will improve Outcome:  Progressing   Problem: Fluid Volume: Goal: Ability to maintain a balanced intake and output will improve Outcome: Progressing   Problem: Health Behavior/Discharge Planning: Goal: Ability to identify and utilize available resources and services will improve Outcome: Progressing Goal: Ability to manage health-related needs will improve Outcome: Progressing   Problem: Metabolic: Goal: Ability to maintain appropriate glucose levels will improve Outcome: Progressing   Problem: Nutritional: Goal: Maintenance of adequate nutrition will improve Outcome: Progressing Goal: Progress toward achieving an optimal weight will improve Outcome: Progressing   Problem: Skin Integrity: Goal: Risk for impaired skin integrity will decrease Outcome: Progressing   Problem: Tissue Perfusion: Goal: Adequacy of tissue perfusion will improve Outcome: Progressing

## 2023-10-06 NOTE — Progress Notes (Signed)
 PCCM Progress Note   Afternoon rounds   Patients limited ECHO revealed several drastic changes. Her EF is now down to 25-30% (60-65% on 08/22/2023) with several new areas of WMA including apex, entire lateral wall, anterior septum, and basal inferior segment. This coupled with spike in HS troponin prompted initiation of heparin  drip with consult to cardiology. Family was updated.   Myley Bahner D. Harris, NP-C [facility] Personal contact information can be found on Amion  If no contact or response made please call [phone] 10/06/2023, 5:22 PM

## 2023-10-06 NOTE — ED Provider Notes (Signed)
 [facility]ER[facility] DEPT [facility] Emergency Department Provider Note MRN:  [id number]  Arrival date & time: 10/06/23     Chief Complaint   Chest Pain and Shortness of Breath (Hypertensive)   History of Present Illness   Jacqueline Hancock is a 83 y.o. year-old female with a history of lymphoma presenting to the ED with chief complaint of chest pain and shortness of breath.  Sudden onset chest pain and shortness of breath a few hours ago.  Receiving treatment for large tumor in her abdomen related to lymphoma.  Review of Systems  A thorough review of systems was obtained and all systems are negative except as noted in the HPI and PMH.   Patient's Health History    Past Medical History:  Diagnosis Date   CLL (chronic lymphocytic leukemia) (HCC)    Dyspnea 2021   with exersion    Early cataracts, bilateral    Elevated hemoglobin A1c    Fibroids    GERD (gastroesophageal reflux disease)    Hyperlipidemia    Hypertension    Lymphoma (HCC)    Osteopenia     Past Surgical History:  Procedure Laterality Date   ABDOMINAL HYSTERECTOMY     APPENDECTOMY     BALLOON DILATION N/A 08/31/2023   Procedure: BALLOON DILATION;  Surgeon: Charlanne Groom, MD;  Location: [facility];  Service: Gastroenterology;  Laterality: N/A;   CATARACT EXTRACTION, BILATERAL Bilateral 09/2017   Dr. 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   TONSILLECTOMY AND ADENOIDECTOMY      Family History  Problem Relation Age of Onset   Leukemia Mother    Hypertension Father    Diabetes Sister    Heart disease Brother    Diabetes Brother    Diabetes Brother    Hypertension Son    Heart disease Son     Social History   Socioeconomic History   Marital status: Widowed     Spouse name: Not on file   Number of children: 3   Years of education: Not on file   Highest education level: Not on file  Occupational History   Occupation: retired  Tobacco Use   Smoking status: Never   Smokeless tobacco: Never  Vaping Use   Vaping status: Never Used  Substance and Sexual Activity   Alcohol use: No   Drug use: Never   Sexual activity: Not on file  Other Topics Concern   Not on file  Social History Narrative   Not on file   Social Drivers of Health   Financial Resource Strain: Not on file  Food Insecurity: No Food Insecurity (08/29/2023)   Hunger Vital Sign    Worried About Running Out of Food in the Last Year: Never true    Ran Out of Food in the Last Year: Never true  Transportation Needs: No Transportation Needs (08/29/2023)   PRAPARE - Administrator, Civil Service (Medical): No    Lack of Transportation (Non-Medical): No  Physical Activity: Not on file  Stress: Not on file  Social Connections: Moderately Integrated (08/29/2023)   Social Connection and Isolation Panel    Frequency of Communication with Friends and Family: More than three times a week    Frequency of Social Gatherings with Friends and Family: Three  times a week    Attends Religious Services: More than 4 times per year    Active Member of Clubs or Organizations: Yes    Attends Banker Meetings: More than 4 times per year    Marital Status: Widowed  Intimate Partner Violence: Not At Risk (08/29/2023)   Humiliation, Afraid, Rape, and Kick questionnaire    Fear of Current or Ex-Partner: No    Emotionally Abused: No    Physically Abused: No    Sexually Abused: No     Physical Exam   Vitals:   10/06/23 0615 10/06/23 0716  BP: 131/79 131/69  Pulse:  (!) 110  Resp:  14  Temp:  (!) 96.6 F (35.9 C)  SpO2:  100%    CONSTITUTIONAL: Ill-appearing, NAD NEURO/PSYCH:  Alert and oriented x 3, no focal deficits EYES:  eyes equal and reactive ENT/NECK:  no LAD,  no JVD CARDIO: Tachycardic rate, well-perfused, normal S1 and S2 PULM: Tachypneic, no wheezing GI/GU:  non-distended, non-tender MSK/SPINE:  No gross deformities, no edema SKIN:  no rash, atraumatic   *Additional and/or pertinent findings included in MDM below  Diagnostic and Interventional Summary    EKG Interpretation Date/Time:  Friday October 06 2023 04:14:41 EDT Ventricular Rate:  123 PR Interval:  126 QRS Duration:  66 QT Interval:  314 QTC Calculation: 449 R Axis:   31  Text Interpretation: Sinus tachycardia with occasional Premature ventricular complexes Nonspecific ST and T wave abnormality Abnormal ECG When compared with ECG of 28-Aug-2023 15:49, PREVIOUS ECG IS PRESENT Confirmed by Theadore Sharper [phone]) on 10/06/2023 5:45:50 AM       Labs Reviewed  BASIC METABOLIC PANEL WITH GFR - Abnormal; Notable for the following components:      Result Value   CO2 20 (*)    Glucose, Bld 124 (*)    BUN 29 (*)    Creatinine, Ser 1.63 (*)    Calcium 8.4 (*)    GFR, Estimated 31 (*)    All other components within normal limits  CBC - Abnormal; Notable for the following components:   WBC 26.0 (*)    RBC 3.13 (*)    Hemoglobin 9.0 (*)    HCT 28.3 (*)    All other components within normal limits  PROTIME-INR - Abnormal; Notable for the following components:   Prothrombin Time 16.3 (*)    All other components within normal limits  HEPATIC FUNCTION PANEL - Abnormal; Notable for the following components:   Total Protein 5.7 (*)    Albumin  2.9 (*)    Total Bilirubin 1.3 (*)    Bilirubin, Direct 0.4 (*)    All other components within normal limits  BRAIN NATRIURETIC PEPTIDE - Abnormal; Notable for the following components:   B Natriuretic Peptide 1,289.3 (*)    All other components within normal limits  I-STAT CG4 LACTIC ACID, ED - Abnormal; Notable for the following components:   Lactic Acid, Venous 2.7 (*)    All other components within normal limits  I-STAT ARTERIAL BLOOD  GAS, ED - Abnormal; Notable for the following components:   pH, Arterial 7.110 (*)    pCO2 arterial 71.8 (*)    pO2, Arterial 151 (*)    Acid-base deficit 7.0 (*)    HCT 26.0 (*)    Hemoglobin 8.8 (*)    All other components within normal limits  TROPONIN I (HIGH SENSITIVITY) - Abnormal; Notable for the following components:   Troponin I (High Sensitivity) 63 (*)  All other components within normal limits  TROPONIN I (HIGH SENSITIVITY) - Abnormal; Notable for the following components:   Troponin I (High Sensitivity) 343 (*)    All other components within normal limits  RESP PANEL BY RT-PCR (RSV, FLU A&B, COVID)  RVPGX2  CULTURE, BLOOD (ROUTINE X 2)  CULTURE, BLOOD (ROUTINE X 2)  URINALYSIS, W/ REFLEX TO CULTURE (INFECTION SUSPECTED)  I-STAT CG4 LACTIC ACID, ED    DG Chest Portable 1 View  Final Result    CT Angio Chest Pulmonary Embolism (PE) W or WO Contrast  Final Result    CT ABDOMEN PELVIS W CONTRAST  Final Result    DG Chest Port 1 View  Final Result      Medications  etomidate  (AMIDATE ) injection (10 mg Intravenous Given 10/06/23 0643)  rocuronium  (ZEMURON ) injection (80 mg Intravenous Given 10/06/23 0643)  propofol  (DIPRIVAN ) 1000 MG/100ML infusion (10 mcg/kg/min  64 kg Intravenous New Bag/Given 10/06/23 0735)  fentaNYL  in NS (48mcg/ml) infusion-PREMIX (25 mcg/hr Intravenous New Bag/Given 10/06/23 0735)  ceFEPIme  (MAXIPIME ) 2 g in sodium chloride  0.9 % 100 mL IVPB (0 g Intravenous Stopped 10/06/23 0613)  iohexol  (OMNIPAQUE ) 350 MG/ML injection 75 mL (75 mLs Intravenous Contrast Given 10/06/23 0526)  labetalol  (NORMODYNE ) injection 10 mg (10 mg Intravenous Given 10/06/23 0539)  furosemide  (LASIX ) injection 20 mg (20 mg Intravenous Given 10/06/23 0648)     Procedures  /  Critical Care .Critical Care  Performed by: Theadore Ozell HERO, MD Authorized by: Theadore Ozell HERO, MD   Critical care provider statement:    Critical care time (minutes):  85   Critical care was  necessary to treat or prevent imminent or life-threatening deterioration of the following conditions:  Respiratory failure   Critical care was time spent personally by me on the following activities:  Development of treatment plan with patient or surrogate, discussions with consultants, evaluation of patient's response to treatment, examination of patient, ordering and review of laboratory studies, ordering and review of radiographic studies, ordering and performing treatments and interventions, pulse oximetry, re-evaluation of patient's condition and review of old charts Procedure Name: Intubation Date/Time: 10/06/2023 7:01 AM  Performed by: Theadore Ozell HERO, MDPre-anesthesia Checklist: Patient identified, Patient being monitored, Emergency Drugs available, Timeout performed and Suction available Oxygen Delivery Method: Non-rebreather mask Preoxygenation: Pre-oxygenation with 100% oxygen Induction Type: Rapid sequence Ventilation: Mask ventilation without difficulty Laryngoscope Size: Glidescope and 4 Grade View: Grade II Tube size: 7.5 mm Airway Equipment and Method: Rigid stylet Placement Confirmation: ETT inserted through vocal cords under direct vision, CO2 detector and Breath sounds checked- equal and bilateral Secured at: 23 cm Tube secured with: ETT holder Comments: Intubated using 10 mg etomidate , 80 mg rocuronium .      ED Course and Medical Decision Making  Initial Impression and Ddx Patient is tachypneic, diaphoretic, tachycardic.  Looks ill.  Repeatedly saying that she is going to pass out.  Presentation concerning for pulmonary embolism given patient's underlying cancer.  Could also be CHF exacerbation, hypertensive emergency, pneumonia with sepsis.  Placed on BiPAP, obtaining rapid CT imaging.  Past medical/surgical history that increases complexity of ED encounter: Lymphoma  Interpretation of Diagnostics I personally reviewed the EKG and my interpretation is as follows:  Sinus tachycardia  Prominent leukocytosis, BNP elevation, mild troponin elevation  Patient Reassessment and Ultimate Disposition/Management     Rapid CT imaging obtained without evidence of PE.  Some degree of pulmonary edema.  Suspect hypertensive crisis leading to pulmonary edema.  Patient given  10 mg labetalol  as well as 20 mg Lasix .  Vital signs much improved, heart rate down from 140 to 100.  Blood pressure down to 130s systolic.  Patient seems more comfortable, work of breathing decreased.  However now becoming more somnolent.  Initially able to wake up and converse, the thought was she was simply exhausted or tired from the work of breathing.  She continued to have worsening mental status however.  ABG obtained confirming hypercarbia.  Mental status no longer appropriate for BiPAP and doubtful that changing BiPAP settings would alter patient's course for needing intubation.  Goals of care discussed with patient's son at the bedside.  Full code, wants to be on a ventilator if necessary.  Intubated as described above, will admit to ICU.  Patient management required discussion with the following services or consulting groups:  Intensivist Service  Complexity of Problems Addressed Acute illness or injury that poses threat of life of bodily function  Additional Data Reviewed and Analyzed Further history obtained from: Further history from spouse/family member  Additional Factors Impacting ED Encounter Risk Consideration of hospitalization and Major procedures  Ozell HERO. Theadore, MD [facility] Emergency Medicine [facility] [email or web address]  Final Clinical Impressions(s) / ED Diagnoses     ICD-10-CM   1. Acute respiratory failure with hypoxia and hypercapnia (HCC)  J96.01    J96.02       ED Discharge Orders     None        Discharge Instructions Discussed with and Provided to Patient:   Discharge Instructions   None      Theadore Ozell HERO, MD 10/06/23  [phone]

## 2023-10-06 NOTE — Progress Notes (Signed)
 Pt transported on BiPAP from ED TR C to CT and back without complications.

## 2023-10-06 NOTE — Progress Notes (Signed)
 PHARMACY - ANTICOAGULATION CONSULT NOTE  Pharmacy Consult for IV Heparin  Indication: chest pain/ACS  Allergies  Allergen Reactions   Nexium [Esomeprazole Magnesium ] Other (See Comments)    Headache   Peanut Butter Flavoring Agent (Non-Screening) Itching    Patient Measurements: Height: 4' 11.02 (149.9 cm) Weight: 66.3 kg (146 lb 2.6 oz) IBW/kg (Calculated) : 43.24 Heparin  Dosing Weight 57.7 kg  Vital Signs: Temp: 98.6 F (37 C) (07/04 1700) Temp Source: Axillary (07/04 0716) BP: 113/69 (07/04 1700) Pulse Rate: 83 (07/04 1700)  Labs: Recent Labs    10/04/23 1100 10/04/23 1100 10/06/23 0416 10/06/23 0605 10/06/23 0615 10/06/23 0816 10/06/23 0845 10/06/23 1059 10/06/23 1612  HGB 9.0*   < > 9.0* 8.8*  --  9.2* 8.8*  --   --   HCT 28.3*  --  28.3* 26.0*  --  30.1* 26.0*  --   --   PLT 147*  --  235  --   --  187  --   --   --   LABPROT  --   --  16.3*  --   --   --   --   --   --   INR  --   --  1.2  --   --   --   --   --   --   CREATININE 1.61*  --  1.63*  --   --  1.88*  --   --  2.07*  TROPONINIHS  --    < > 63*  --  343* 1,163*  --  1,186*  --    < > = values in this interval not displayed.    Estimated Creatinine Clearance: 17 mL/min (A) (by C-G formula based on SCr of 2.07 mg/dL (H)).   Medical History: Past Medical History:  Diagnosis Date   CLL (chronic lymphocytic leukemia) (HCC)    Dyspnea 2021   with exersion    Early cataracts, bilateral    Elevated hemoglobin A1c    Fibroids    GERD (gastroesophageal reflux disease)    Hyperlipidemia    Hypertension    Lymphoma (HCC)    Osteopenia     Medications:  Medications Prior to Admission  Medication Sig Dispense Refill Last Dose/Taking   pantoprazole  (PROTONIX ) 40 MG tablet Take 1 tablet (40 mg total) by mouth 2 (two) times daily for 56 days, THEN 1 tablet (40 mg total) daily. 142 tablet 0 10/04/2023   polyethylene glycol (MIRALAX  / GLYCOLAX ) 17 g packet Take 17 g by mouth daily as needed for mild  constipation or moderate constipation. Also available OTC 30 each 0 Past Week   prochlorperazine  (COMPAZINE ) 10 MG tablet Take 1 tablet (10 mg total) by mouth every 6 (six) hours as needed for nausea or vomiting. 30 tablet 6 Unknown   torsemide  (DEMADEX ) 20 MG tablet Take 1 tablet (20 mg total) by mouth daily as needed. Take for weight gain > 3 lbs from baseline 140 lbs  or shortness of breath 90 tablet 1 10/04/2023   traMADol  (ULTRAM ) 50 MG tablet Take 1 tablet (50 mg total) by mouth every 8 (eight) hours as needed for moderate pain (pain score 4-6) or severe pain (pain score 7-10) (pain). 20 tablet 0 Past Week   B Complex Vitamins (VITAMIN B-COMPLEX PO) Take 1 tablet by mouth daily. (Patient not taking: Reported on 10/06/2023)   Not Taking   entecavir  (BARACLUDE ) 0.5 MG tablet Take 1 tablet (0.5 mg total) by mouth every 3 (three)  days. (Patient not taking: Reported on 10/06/2023) 10 tablet 0 Not Taking   loratadine (CLARITIN) 10 MG tablet Take 10 mg by mouth daily. (Patient not taking: Reported on 10/06/2023)   Not Taking   senna-docusate (SENOKOT-S) 8.6-50 MG tablet Take 2 tablets by mouth at bedtime. For constipation (Patient not taking: Reported on 10/06/2023) 60 tablet 1 Not Taking   sucralfate  (CARAFATE ) 1 GM/10ML suspension Take 10 mLs (1 g total) by mouth 2 (two) times daily for 28 days. (Patient not taking: Reported on 10/04/2023) 560 mL 0    Scheduled:   Chlorhexidine  Gluconate Cloth  6 each Topical Daily   [START ON 10/07/2023] famotidine   10 mg Per Tube Daily   heparin   5,000 Units Subcutaneous Q8H   insulin  aspart  0-9 Units Subcutaneous Q4H   mupirocin  ointment  1 Application Nasal BID   mouth rinse  15 mL Mouth Rinse Q2H   Infusions:   [START ON 10/07/2023] ceFEPime  (MAXIPIME ) IV     fentaNYL  infusion INTRAVENOUS 25 mcg/hr (10/06/23 1549)   propofol  (DIPRIVAN ) infusion 15 mcg/kg/min (10/06/23 1700)   [START ON 10/08/2023] vancomycin       Assessment: 83 years of age female admitted in ICU for  respiratory failure and acute flash pulmonary edema and known history of CLL on chemotherapy with concern of cardiotoxicity.  BNP significantly elevated. Troponin elevated and trending up. Pharmacy consulted for IV Heparin  for ACS.   Patient received subcutaneous heparin  5000 units at 1312 PM (5 hrs ago).  Hgb 8.8 - low -stable from prior checks. Platelets are within normal limits. Patient was not on anticoagulation prior to admission. CKD is noted with SCr at 2.07.   Note patient recently received blood transfusion on September 23, 2023 for Hgb drop to 6.2 - deemed anemic, no bleeding noted at that time.   Goal of Therapy:  Heparin  level 0.3-0.7 units/ml Monitor platelets by anticoagulation protocol: Yes   Plan:  Give 3000 units bolus x 1 Start heparin  infusion at 650 units/hr Check anti-Xa level in 8 hours and daily while on heparin  Continue to monitor H&H and platelets  Harlene Boga, PharmD, BCPS, BCCCP Please refer to [facility] for [facility] Pharmacy numbers 10/06/2023,5:12 PM

## 2023-10-06 NOTE — ED Triage Notes (Addendum)
 Patient reports increasing central chest pain with SOB and diaphoresis /lightheadedness onset this morning . Chemotherapy yesterday . Hypertensive at arrival.

## 2023-10-06 NOTE — Progress Notes (Signed)
 Pharmacy Antibiotic Note  Jacqueline Hancock is a 83 y.o. female admitted on 10/06/2023 with pneumonia and sepsis.  Pharmacy has been consulted for vancomycin  dosing.  CTA showing bilateral pulmonary edema - had worsening resp status requiring intubation in ED. WBC up to 26, afeb. LA 2.7>2. Scr 1.63 (CrCl 21 mL/min).  Plan: Vancomycin  1250 mg IV once then 1g IV every 48 hrs (estAUC 565)  Cefepime  2g IV every 24 hrs Monitor renal fx, cx results, clinical pic, and vanc levels as needed   Height: 4' 11 (149.9 cm) IBW/kg (Calculated) : 43.2  Temp (24hrs), Avg:98.7 F (37.1 C), Min:96.6 F (35.9 C), Max:99.3 F (37.4 C)  Recent Labs  Lab 10/04/23 1100 10/06/23 0416 10/06/23 0542 10/06/23 0847  WBC 13.7* 26.0*  --   --   CREATININE 1.61* 1.63*  --   --   LATICACIDVEN  --   --  2.7* 2.0*    Estimated Creatinine Clearance: 21.3 mL/min (A) (by C-G formula based on SCr of 1.63 mg/dL (H)).    Allergies  Allergen Reactions   Nexium [Esomeprazole Magnesium ] Other (See Comments)    Headache   Peanut Butter Flavoring Agent (Non-Screening) Itching    Antimicrobials this admission: Cefepime  7/4 >>  Vanc 7/4 >>   Dose adjustments this admission: N/A  Microbiology results: 7/4 BCx: sent 7/4 COVID/RSV/Flu PCR: neg  7/4 MRSA PCR: sent  Thank you for allowing pharmacy to participate in this patient's care,  Suzen Sour, PharmD, BCCCP Clinical Pharmacist  Phone: [phone] 10/06/2023 8:59 AM  Please check AMION for all [facility] Pharmacy phone numbers After 10:00 PM, call Main Pharmacy [phone]

## 2023-10-06 NOTE — H&P (Signed)
 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  Per son shortness of breath woke patient up from sleep.  Per EDP patient presented severely hypertensive and tachycardic with acute concern for PE versus pulmonary edema in the setting of hypertensive emergency.  CTA chest negative for PE but consistent with likely bilateral pulmonary edema.  Initially with treatment of hypertension and BiPAP the patient's work of breathing improved.  However she then became somnolent and BiPAP no longer became safe therefore patient was intubated for for hypercapnic and-hypoxic respiratory failure. Lab work significant for creatinine 1.63, GFR 31, calcium 8.4, albumin  2.9, BNP 1289, high-sensitivity troponin 63 > 343, WBC 26.0, hgb 9.0.  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 rate 14, height 4' 11 (1.499 m), SpO2 95%.    Vent Mode: PRVC FiO2 (%):   [40 %-100 %] 60 % Set Rate:  [14 bmp-26 bmp] 26 bmp Vt Set:  [350 mL] 350 mL PEEP:  [5 cmH20] 5 cmH20   Intake/Output Summary (Last 24 hours) at 10/06/2023 9191 Last data filed at 10/06/2023 9386 Gross per 24 hour  Intake 94.86 ml  Output --  Net 94.86 ml   There were no vitals filed for this visit.  Examination: General: Acute on chronically ill appearing deconditioned elderly female lying in bed on mechanical ventilation, in NAD HEENT: ETT, MM pink/moist, PERRL,  Neuro: Sedated on ventilator CV: s1s2 regular rate and rhythm, no murmur, rubs, or gallops,  PULM: Tolerating ventilator, no increased work of breathing, faint crackles at the bases GI: soft, bowel sounds active in all 4 quadrants, non-tender, non-distended Extremities: warm/dry, no edema  Skin: no rashes or lesions  Resolved problem list   Assessment and Plan  Acute hypoxic and hypercapnic respiratory failure - Shortness of breath was sudden on development felt secondary to flash pulmonary edema in the setting hypertension with component of hypercapnia P: Continue ventilator support with lung protective strategies  Wean PEEP and FiO2 for sats greater than 90%. Head of bed elevated 30 degrees. Plateau pressures less than 30 cm H20.  Follow intermittent chest x-ray and ABG.   SAT/SBT as tolerated, mentation preclude extubation  Ensure adequate pulmonary hygiene  Follow cultures  VAP bundle in place  PAD protocol Empiric cefepime  and vancomycin  given immunosuppression Follow cultures  Concern for impaired cardiac function - Clinical exam consistent with pulmonary edema likely in the setting of hypertension but underlying impaired cardiac function unable to be ruled out at this time.  BNP elevated at 1289 on admission. - Echocardiogram Aug 22, 2023 with EF of 60 to 65%, no WMA, and grade 1 diastolic dysfunction.  Essential hypertension with hypertensive urgency on admission -Does not appear patient is on any  antihypertensives at baseline but was recently started on Demadex  for lower extremity swelling  Hyperlipidemia Elevated troponin P: Repeat limited echo to evaluate EF given cardiotoxic component of patient's chemotherapy Continuous telemetry  Monitor hemodynamics in the ICU Strict intake and output  Daily weight to assess volume status Daily assessment for need to diurese Closely monitor renal function and electrolytes  Vent support as above Trend troponins If troponins continue to rise consider heparin  drip  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 P: Management per oncology Supportive care  Chronic kidney disease stage IIIb - Baseline creatinine appears to range mid to low 30s P: Follow renal function  Monitor  urine output Trend Bmet Avoid nephrotoxins Ensure adequate renal perfusion  IV hydration provided on admission   Best Practice (right click and Reselect all SmartList Selections daily)   Diet/type: NPO DVT prophylaxis SCD Pressure ulcer(s): N/A GI prophylaxis: PPI Lines: N/A Foley:  N/A Code Status:  full code Last date of multidisciplinary goals of care discussion: Pending  Labs   CBC: Recent Labs  Lab 10/04/23 1100 10/06/23 0416 10/06/23 0605  WBC 13.7* 26.0*  --   NEUTROABS 10.1*  --   --   HGB 9.0* 9.0* 8.8*  HCT 28.3* 28.3* 26.0*  MCV 88.7 90.4  --   PLT 147* 235  --     Basic Metabolic Panel: Recent Labs  Lab 10/04/23 1100 10/06/23 0416 10/06/23 0605  NA 141 139 138  K 3.6 3.7 3.6  CL 103 105  --   CO2 29 20*  --   GLUCOSE 103* 124*  --   BUN 24* 29*  --   CREATININE 1.61* 1.63*  --   CALCIUM 8.5* 8.4*  --    GFR: Estimated Creatinine Clearance: 21.3 mL/min (A) (by C-G formula based on SCr of 1.63 mg/dL (H)). Recent Labs  Lab 10/04/23 1100 10/06/23 0416 10/06/23 0542  WBC 13.7* 26.0*  --   LATICACIDVEN  --   --  2.7*    Liver Function Tests: Recent Labs  Lab 10/04/23 1100 10/06/23 0416  AST 16  39  ALT 11 23  ALKPHOS 109 108  BILITOT 0.7 1.3*  PROT 5.8* 5.7*  ALBUMIN  3.4* 2.9*   No results for input(s): LIPASE, AMYLASE in the last 168 hours. No results for input(s): AMMONIA in the last 168 hours.  ABG    Component Value Date/Time   PHART 7.110 (LL) 10/06/2023 0605   PCO2ART 71.8 (HH) 10/06/2023 0605   PO2ART 151 (H) 10/06/2023 0605   HCO3 22.8 10/06/2023 0605   TCO2 25 10/06/2023 0605   ACIDBASEDEF 7.0 (H) 10/06/2023 0605   O2SAT 98 10/06/2023 0605     Coagulation Profile: Recent Labs  Lab 10/06/23 0416  INR 1.2    Cardiac Enzymes: No results for input(s): CKTOTAL, CKMB, CKMBINDEX, TROPONINI in the last 168 hours.  HbA1C: Hgb A1c MFr Bld  Date/Time Value Ref Range Status  06/14/2023 11:34 AM 5.7 (H) <5.7 % of total Hgb Final    Comment:    For someone without known diabetes, a hemoglobin  A1c value between 5.7% and 6.4% is consistent with prediabetes and should be confirmed with a  follow-up test. . For someone with known diabetes, a value <7% indicates that  their diabetes is well controlled. A1c targets should be individualized based on duration of diabetes, age, comorbid conditions, and other considerations. . This assay result is consistent with an increased risk of diabetes. . Currently, no consensus exists regarding use of hemoglobin A1c for diagnosis of diabetes for children. .   02/14/2023 11:19 AM 6.1 (H) <5.7 % of total Hgb Final    Comment:    For someone without known diabetes, a hemoglobin  A1c value between 5.7% and 6.4% is consistent with prediabetes and should be confirmed with a  follow-up test. . For someone with known diabetes, a value <7% indicates that their diabetes is well controlled. A1c targets should be individualized based on duration of diabetes, age, comorbid conditions, and other considerations. . This assay result is consistent with an increased risk of diabetes. . Currently, no consensus  exists regarding use of hemoglobin A1c for diagnosis of diabetes for children. .     CBG: No results for input(s): GLUCAP in the last 168 hours.  Review of Systems:   Unable to assess   Past Medical History:  She,  has a past medical history of CLL (chronic lymphocytic leukemia) (HCC), Dyspnea (2021), Jacqueline cataracts, bilateral, Elevated hemoglobin A1c, Fibroids, GERD (gastroesophageal reflux disease), Hyperlipidemia, Hypertension, Lymphoma (HCC), and Osteopenia.   Surgical History:   Past Surgical History:  Procedure Laterality Date   ABDOMINAL HYSTERECTOMY     APPENDECTOMY     BALLOON DILATION N/A 08/31/2023   Procedure: BALLOON DILATION;  Surgeon: Charlanne Groom, MD;  Location: [facility];  Service: Gastroenterology;  Laterality: N/A;   CATARACT EXTRACTION, BILATERAL Bilateral 09/2017   Dr. 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   TONSILLECTOMY AND ADENOIDECTOMY       Social History:   reports that she has never smoked. She has never used smokeless tobacco. She reports that she does not drink alcohol and does not use drugs.   Family History:  Her family history includes Diabetes in her brother, brother, and sister; Heart disease in her brother and son; Hypertension in her father and son; Leukemia in her mother.   Allergies Allergies  Allergen Reactions   Nexium [Esomeprazole Magnesium ] Other (See Comments)    Headache   Peanut Butter Flavoring Agent (Non-Screening) Itching     Home Medications  Prior to Admission medications   Medication Sig Start Date End Date Taking? Authorizing Provider  pantoprazole  (PROTONIX ) 40 MG tablet Take 1 tablet (40 mg total) by mouth 2 (two) times daily for 56 days, THEN 1 tablet (40 mg  total) daily. 09/05/23 11/30/23 Yes Cindy Garnette POUR, MD  polyethylene glycol (MIRALAX  / GLYCOLAX ) 17 g packet Take 17 g by mouth daily as needed for mild constipation or moderate constipation. Also available OTC 06/18/23  Yes Rai, Ripudeep K, MD  prochlorperazine  (COMPAZINE ) 10 MG tablet Take 1 tablet (10 mg total) by mouth every 6 (six) hours as needed for nausea or vomiting. 08/18/23  Yes Onesimo Emaline Brink, MD  torsemide  (DEMADEX ) 20 MG tablet Take 1 tablet (20 mg total) by mouth daily as needed. Take for weight gain > 3 lbs from baseline 140 lbs  or shortness of breath 09/12/23  Yes Ngetich, Dinah C, NP  traMADol  (ULTRAM ) 50 MG tablet Take 1 tablet (50 mg  total) by mouth every 8 (eight) hours as needed for moderate pain (pain score 4-6) or severe pain (pain score 7-10) (pain). 06/18/23  Yes Rai, Ripudeep K, MD  B Complex Vitamins (VITAMIN B-COMPLEX PO) Take 1 tablet by mouth daily. Patient not taking: Reported on 10/06/2023    [provider]  entecavir  (BARACLUDE ) 0.5 MG tablet Take 1 tablet (0.5 mg total) by mouth every 3 (three) days. Patient not taking: Reported on 10/06/2023 09/08/23 10/08/23  Cindy Garnette POUR, MD  loratadine (CLARITIN) 10 MG tablet Take 10 mg by mouth daily. Patient not taking: Reported on 10/06/2023    [provider]  senna-docusate (SENOKOT-S) 8.6-50 MG tablet Take 2 tablets by mouth at bedtime. For constipation Patient not taking: Reported on 10/06/2023 06/18/23   Rai, Nydia POUR, MD  sucralfate  (CARAFATE ) 1 GM/10ML suspension Take 10 mLs (1 g total) by mouth 2 (two) times daily for 28 days. Patient not taking: Reported on 10/04/2023 09/05/23 10/03/23  Cindy Garnette POUR, MD  gabapentin  (NEURONTIN ) 100 MG capsule Take 1 capsule 3 x /day for Neuropathy Pain Patient not taking: Reported on 01/25/2020 08/02/19 01/25/20  Tonita Fallow, MD     Critical care time:   CRITICAL CARE Performed by: Dhanya Bogle D. Hancock   Total critical care time: 42 minutes  Critical care time was  exclusive of separately billable procedures and treating other patients.  Critical care was necessary to treat or prevent imminent or life-threatening deterioration.  Critical care was time spent personally by me on the following activities: development of treatment plan with patient and/or surrogate as well as nursing, discussions with consultants, evaluation of patient's response to treatment, examination of patient, obtaining history from patient or surrogate, ordering and performing treatments and interventions, ordering and review of laboratory studies, ordering and review of radiographic studies, pulse oximetry and re-evaluation of patient's condition.  Jacqueline Steel D. Harris, NP-C [facility] Personal contact information can be found on Amion  If no contact or response made please call [phone] 10/06/2023, 8:46 AM

## 2023-10-07 ENCOUNTER — Encounter (HOSPITAL_COMMUNITY): Payer: Self-pay | Admitting: Pulmonary Disease

## 2023-10-07 ENCOUNTER — Inpatient Hospital Stay

## 2023-10-07 DIAGNOSIS — I214 Non-ST elevation (NSTEMI) myocardial infarction: Secondary | ICD-10-CM

## 2023-10-07 DIAGNOSIS — I5021 Acute systolic (congestive) heart failure: Secondary | ICD-10-CM | POA: Diagnosis not present

## 2023-10-07 DIAGNOSIS — N179 Acute kidney failure, unspecified: Secondary | ICD-10-CM | POA: Diagnosis not present

## 2023-10-07 DIAGNOSIS — J810 Acute pulmonary edema: Secondary | ICD-10-CM | POA: Diagnosis not present

## 2023-10-07 DIAGNOSIS — J9601 Acute respiratory failure with hypoxia: Principal | ICD-10-CM

## 2023-10-07 LAB — PHOSPHORUS: Phosphorus: 5 mg/dL — ABNORMAL HIGH (ref 2.5–4.6)

## 2023-10-07 LAB — CBC
HCT: 25.3 % — ABNORMAL LOW (ref 36.0–46.0)
Hemoglobin: 8 g/dL — ABNORMAL LOW (ref 12.0–15.0)
MCH: 28.4 pg (ref 26.0–34.0)
MCHC: 31.6 g/dL (ref 30.0–36.0)
MCV: 89.7 fL (ref 80.0–100.0)
Platelets: 147 K/uL — ABNORMAL LOW (ref 150–400)
RBC: 2.82 MIL/uL — ABNORMAL LOW (ref 3.87–5.11)
RDW: 15 % (ref 11.5–15.5)
WBC: 14.6 K/uL — ABNORMAL HIGH (ref 4.0–10.5)
nRBC: 0 % (ref 0.0–0.2)

## 2023-10-07 LAB — BASIC METABOLIC PANEL WITH GFR
Anion gap: 15 (ref 5–15)
BUN: 33 mg/dL — ABNORMAL HIGH (ref 8–23)
CO2: 20 mmol/L — ABNORMAL LOW (ref 22–32)
Calcium: 8.2 mg/dL — ABNORMAL LOW (ref 8.9–10.3)
Chloride: 104 mmol/L (ref 98–111)
Creatinine, Ser: 2.03 mg/dL — ABNORMAL HIGH (ref 0.44–1.00)
GFR, Estimated: 24 mL/min — ABNORMAL LOW (ref >60)
Glucose, Bld: 99 mg/dL (ref 70–99)
Potassium: 4 mmol/L (ref 3.5–5.1)
Sodium: 139 mmol/L (ref 135–145)

## 2023-10-07 LAB — HEPARIN LEVEL (UNFRACTIONATED)
Heparin Unfractionated: 0.21 [IU]/mL — ABNORMAL LOW (ref 0.30–0.70)
Heparin Unfractionated: 0.32 [IU]/mL (ref 0.30–0.70)

## 2023-10-07 LAB — MAGNESIUM: Magnesium: 1.5 mg/dL — ABNORMAL LOW (ref 1.7–2.4)

## 2023-10-07 LAB — GLUCOSE, CAPILLARY
Glucose-Capillary: 102 mg/dL — ABNORMAL HIGH (ref 70–99)
Glucose-Capillary: 108 mg/dL — ABNORMAL HIGH (ref 70–99)
Glucose-Capillary: 105 mg/dL — ABNORMAL HIGH (ref 70–99)
Glucose-Capillary: 95 mg/dL (ref 70–99)

## 2023-10-07 MED ORDER — MAGNESIUM SULFATE 4 GM/100ML IV SOLN
4.0000 g | Freq: Once | INTRAVENOUS | Status: AC
  Administered 2023-10-07: 4 g via INTRAVENOUS
  Filled 2023-10-07: qty 100

## 2023-10-07 MED ORDER — SODIUM CHLORIDE 0.9 % IV SOLN
500.0000 mg | INTRAVENOUS | Status: AC
  Administered 2023-10-07 – 2023-10-09 (×3): 500 mg via INTRAVENOUS
  Filled 2023-10-07 (×3): qty 5

## 2023-10-07 MED ORDER — FUROSEMIDE 10 MG/ML IJ SOLN
80.0000 mg | Freq: Every day | INTRAMUSCULAR | Status: DC
  Administered 2023-10-07: 80 mg via INTRAVENOUS
  Filled 2023-10-07: qty 8

## 2023-10-07 MED ORDER — ASPIRIN 81 MG PO TBEC: 81.0000 mg | DELAYED_RELEASE_TABLET | Freq: Every day | ORAL | Status: DC

## 2023-10-07 MED ORDER — DOCUSATE SODIUM 50 MG/5ML PO LIQD
100.0000 mg | Freq: Two times a day (BID) | ORAL | Status: DC | PRN
  Administered 2023-10-09: 100 mg via ORAL
  Filled 2023-10-07: qty 10

## 2023-10-07 MED ORDER — PEGFILGRASTIM-CBQV 6 MG/0.6ML ~~LOC~~ SOSY: 6.0000 mg | PREFILLED_SYRINGE | Freq: Once | SUBCUTANEOUS | Status: DC

## 2023-10-07 MED ORDER — ORAL CARE MOUTH RINSE: 15.0000 mL | OROMUCOSAL | Status: DC | PRN

## 2023-10-07 MED ORDER — ASPIRIN 81 MG PO CHEW
81.0000 mg | CHEWABLE_TABLET | Freq: Every day | ORAL | Status: DC
  Administered 2023-10-07 – 2023-10-12 (×6): 81 mg via ORAL
  Filled 2023-10-07 (×6): qty 1

## 2023-10-07 MED ORDER — ASPIRIN 81 MG PO CHEW: 81.0000 mg | CHEWABLE_TABLET | Freq: Every day | ORAL | Status: DC

## 2023-10-07 NOTE — Plan of Care (Signed)
 Extubated on 2L [place], A+OX4. Problem: Education: Goal: Knowledge of General Education information will improve Description: Including pain rating scale, medication(s)/side effects and non-pharmacologic comfort measures Outcome: Progressing   Problem: Health Behavior/Discharge Planning: Goal: Ability to manage health-related needs will improve Outcome: Progressing   Problem: Clinical Measurements: Goal: Ability to maintain clinical measurements within normal limits will improve Outcome: Progressing Goal: Will remain free from infection Outcome: Progressing Goal: Diagnostic test results will improve Outcome: Progressing Goal: Respiratory complications will improve Outcome: Progressing Goal: Cardiovascular complication will be avoided Outcome: Progressing   Problem: Activity: Goal: Risk for activity intolerance will decrease Outcome: Progressing   Problem: Nutrition: Goal: Adequate nutrition will be maintained Outcome: Progressing   Problem: Coping: Goal: Level of anxiety will decrease Outcome: Progressing   Problem: Elimination: Goal: Will not experience complications related to bowel motility Outcome: Progressing Goal: Will not experience complications related to urinary retention Outcome: Progressing   Problem: Pain Managment: Goal: General experience of comfort will improve and/or be controlled Outcome: Progressing   Problem: Safety: Goal: Ability to remain free from injury will improve Outcome: Progressing   Problem: Skin Integrity: Goal: Risk for impaired skin integrity will decrease Outcome: Progressing   Problem: Education: Goal: Ability to describe self-care measures that may prevent or decrease complications (Diabetes Survival Skills Education) will improve Outcome: Progressing Goal: Individualized Educational Video(s) Outcome: Progressing   Problem: Coping: Goal: Ability to adjust to condition or change in health will improve Outcome: Progressing    Problem: Fluid Volume: Goal: Ability to maintain a balanced intake and output will improve Outcome: Progressing   Problem: Health Behavior/Discharge Planning: Goal: Ability to identify and utilize available resources and services will improve Outcome: Progressing Goal: Ability to manage health-related needs will improve Outcome: Progressing   Problem: Metabolic: Goal: Ability to maintain appropriate glucose levels will improve Outcome: Progressing   Problem: Nutritional: Goal: Maintenance of adequate nutrition will improve Outcome: Progressing Goal: Progress toward achieving an optimal weight will improve Outcome: Progressing   Problem: Skin Integrity: Goal: Risk for impaired skin integrity will decrease Outcome: Progressing   Problem: Tissue Perfusion: Goal: Adequacy of tissue perfusion will improve Outcome: Progressing

## 2023-10-07 NOTE — Plan of Care (Signed)
   Problem: Clinical Measurements: Goal: Ability to maintain clinical measurements within normal limits will improve Outcome: Progressing   Problem: Coping: Goal: Level of anxiety will decrease Outcome: Progressing

## 2023-10-07 NOTE — Progress Notes (Signed)
 Patient refusing further CBG monitoring. States MD was supposed to stop them.

## 2023-10-07 NOTE — Progress Notes (Signed)
 PHARMACY - ANTICOAGULATION CONSULT NOTE  Pharmacy Consult for heparin  Indication: NSTEMI  Labs: Recent Labs    10/06/23 0416 10/06/23 0605 10/06/23 0615 10/06/23 0816 10/06/23 0845 10/06/23 1059 10/06/23 1612 10/07/23 0144  HGB 9.0*   < >  --  9.2* 8.8*  --   --  8.0*  HCT 28.3*   < >  --  30.1* 26.0*  --   --  25.3*  PLT 235  --   --  187  --   --   --  147*  LABPROT 16.3*  --   --   --   --   --   --   --   INR 1.2  --   --   --   --   --   --   --   HEPARINUNFRC  --   --   --   --   --   --   --  0.21*  CREATININE 1.63*  --   --  1.88*  --   --  2.07* 2.03*  TROPONINIHS 63*  --  343* 1,163*  --  1,186*  --   --    < > = values in this interval not displayed.   Assessment: 83yo female subtherapeutic on heparin  with initial dosing for NSTEMI; no infusion issues or signs of bleeding per RN.  Goal of Therapy:  Heparin  level 0.3-0.7 units/ml   Plan:  Increase heparin  infusion by 2 units/kg/hr to 800 units/hr. Check level in 8 hours.   Marvetta Dauphin, PharmD, BCPS 10/07/2023 3:18 AM

## 2023-10-07 NOTE — Progress Notes (Signed)
 PHARMACY - ANTICOAGULATION CONSULT NOTE  Pharmacy Consult for IV Heparin  Indication: chest pain/ACS  Allergies  Allergen Reactions   Nexium [Esomeprazole Magnesium ] Other (See Comments)    Headache   Peanut Butter Flavoring Agent (Non-Screening) Itching    Patient Measurements: Height: 4' 11 (149.9 cm) Weight: 66.3 kg (146 lb 2.6 oz) IBW/kg (Calculated) : 43.2 Heparin  Dosing Weight 57.7 kg  Vital Signs: Temp: 100.2 F (37.9 C) (07/05 1100) Temp Source: Bladder (07/05 0400) BP: 139/81 (07/05 1100) Pulse Rate: 102 (07/05 1100)  Labs: Recent Labs    10/06/23 0416 10/06/23 0605 10/06/23 0615 10/06/23 0816 10/06/23 0845 10/06/23 1059 10/06/23 1612 10/07/23 0144 10/07/23 1046  HGB 9.0*   < >  --  9.2* 8.8*  --   --  8.0*  --   HCT 28.3*   < >  --  30.1* 26.0*  --   --  25.3*  --   PLT 235  --   --  187  --   --   --  147*  --   LABPROT 16.3*  --   --   --   --   --   --   --   --   INR 1.2  --   --   --   --   --   --   --   --   HEPARINUNFRC  --   --   --   --   --   --   --  0.21* 0.32  CREATININE 1.63*  --   --  1.88*  --   --  2.07* 2.03*  --   TROPONINIHS 63*  --  343* 1,163*  --  1,186*  --   --   --    < > = values in this interval not displayed.    Estimated Creatinine Clearance: 17.4 mL/min (A) (by C-G formula based on SCr of 2.03 mg/dL (H)).   Medical History: Past Medical History:  Diagnosis Date   CLL (chronic lymphocytic leukemia) (HCC)    Dyspnea 2021   with exersion    Early cataracts, bilateral    Elevated hemoglobin A1c    Fibroids    GERD (gastroesophageal reflux disease)    Hyperlipidemia    Hypertension    Lymphoma (HCC)    Osteopenia     Medications:  Medications Prior to Admission  Medication Sig Dispense Refill Last Dose/Taking   pantoprazole  (PROTONIX ) 40 MG tablet Take 1 tablet (40 mg total) by mouth 2 (two) times daily for 56 days, THEN 1 tablet (40 mg total) daily. 142 tablet 0 10/04/2023   polyethylene glycol (MIRALAX  /  GLYCOLAX ) 17 g packet Take 17 g by mouth daily as needed for mild constipation or moderate constipation. Also available OTC 30 each 0 Past Week   prochlorperazine  (COMPAZINE ) 10 MG tablet Take 1 tablet (10 mg total) by mouth every 6 (six) hours as needed for nausea or vomiting. 30 tablet 6 Unknown   torsemide  (DEMADEX ) 20 MG tablet Take 1 tablet (20 mg total) by mouth daily as needed. Take for weight gain > 3 lbs from baseline 140 lbs  or shortness of breath 90 tablet 1 10/04/2023   traMADol  (ULTRAM ) 50 MG tablet Take 1 tablet (50 mg total) by mouth every 8 (eight) hours as needed for moderate pain (pain score 4-6) or severe pain (pain score 7-10) (pain). 20 tablet 0 Past Week   B Complex Vitamins (VITAMIN B-COMPLEX PO) Take 1 tablet by mouth  daily. (Patient not taking: Reported on 10/06/2023)   Not Taking   entecavir  (BARACLUDE ) 0.5 MG tablet Take 1 tablet (0.5 mg total) by mouth every 3 (three) days. (Patient not taking: Reported on 10/06/2023) 10 tablet 0 Not Taking   loratadine (CLARITIN) 10 MG tablet Take 10 mg by mouth daily. (Patient not taking: Reported on 10/06/2023)   Not Taking   senna-docusate (SENOKOT-S) 8.6-50 MG tablet Take 2 tablets by mouth at bedtime. For constipation (Patient not taking: Reported on 10/06/2023) 60 tablet 1 Not Taking   sucralfate  (CARAFATE ) 1 GM/10ML suspension Take 10 mLs (1 g total) by mouth 2 (two) times daily for 28 days. (Patient not taking: Reported on 10/04/2023) 560 mL 0    Scheduled:   aspirin   81 mg Oral Daily   Chlorhexidine  Gluconate Cloth  6 each Topical Daily   furosemide   80 mg Intravenous Daily   insulin  aspart  0-9 Units Subcutaneous Q4H   mupirocin  ointment  1 Application Nasal BID   sodium chloride  flush  10-40 mL Intracatheter Q12H   Infusions:   azithromycin  Stopped (10/07/23 1023)   ceFEPime  (MAXIPIME ) IV Stopped (10/07/23 0700)   heparin  800 Units/hr (10/07/23 1100)   [START ON 10/08/2023] vancomycin       Assessment: 83 years of age female  admitted in ICU for respiratory failure and acute flash pulmonary edema and known history of CLL on chemotherapy with concern of cardiotoxicity.  BNP significantly elevated. Troponin elevated and trending up. Pharmacy consulted for IV Heparin  for ACS. Initial Hgb 8.8 - low -stable from prior checks. Platelets are within normal limits. Patient was not on anticoagulation prior to admission. CKD noted with SCr at 2.07. Note patient recently received blood transfusion on September 23, 2023 for Hgb drop to 6.2 - deemed anemic, no bleeding noted at that time.   Therapeutic 0.32, no bleeding noted. CBC reasonable but downtrending, monitor closely  Goal of Therapy:  Heparin  level 0.3-0.7 units/ml Monitor platelets by anticoagulation protocol: Yes   Plan:  Continue heparin  infusion at current rate 800 units/hr Check anti-Xa level daily and PRN while on heparin  Continue to monitor H&H and platelets  Randall Call, PharmD, BCPS 10/07/23 11:37 AM

## 2023-10-07 NOTE — Progress Notes (Addendum)
 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  Per son shortness of breath woke patient up from sleep.  Per EDP patient presented severely hypertensive and tachycardic with acute concern for PE versus pulmonary edema in the setting of hypertensive emergency.  CTA chest negative for PE but consistent with likely bilateral pulmonary edema.  Initially with treatment of hypertension and BiPAP the patient's work of breathing improved.  However she then became somnolent and BiPAP no longer became safe therefore patient was intubated for for hypercapnic and-hypoxic respiratory failure. Lab work significant for creatinine 1.63, GFR 31, calcium 8.4, albumin  2.9, BNP 1289, high-sensitivity troponin 63 > 343, WBC 26.0, hgb 9.0.  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 Able to follow all commands. Does not share any discomfort other than wanting extubation.  Objective    Blood pressure (!) 151/77, pulse 99, temperature 99.9 F (37.7  C), resp. rate (!) 24, height 4' 11 (1.499 m), weight 66.3 kg, SpO2 96%.    Vent Mode: PRVC FiO2 (%):  [40 %] 40 % Set Rate:  [22 bmp] 22 bmp Vt Set:  [380 mL] 380 mL PEEP:  [5 cmH20] 5 cmH20 Pressure Support:  [10 cmH20] 10 cmH20 Plateau Pressure:  [18 cmH20-19 cmH20] 18 cmH20   Intake/Output Summary (Last 24 hours) at 10/07/2023 0741 Last data filed at 10/07/2023 0700 Gross per 24 hour  Intake 1181.39 ml  Output 2875 ml  Net -1693.61 ml   Filed Weights   10/06/23 1048  Weight: 66.3 kg    Examination: General: Acute on chronically ill appearing deconditioned elderly female lying in bed on mechanical ventilation, in NAD HEENT: ETT, MM pink/moist, PERRL,  Neuro: Sedated on ventilator CV: s1s2 regular rate and rhythm, no murmur, rubs, or gallops,  PULM: Tolerating ventilator, no increased work of breathing, faint crackles at the bases GI: soft, bowel sounds active in all 4 quadrants, non-tender, non-distended Extremities: warm/dry, no edema  Skin: no rashes or lesions  Resolved problem list   Assessment and Plan   Acute hypoxic and hypercapnic respiratory failure due to Volume overload NSTEMI with 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 Leukocytosis and Procalcitonin high as well and so also treating CAP. Notably this patient's ECHO findings are acutely changed from May. With ventilator support and lasix  drip overnight respiratory status is stabilized. She is now extubated and doing well on [place].  Cardiology is consulting. Catheterization under consideration but complicated by current immunotherapy for B cell lymphoma. Would need to consider  cardiotoxicity from her imbruvica . For now, medical management with heparin  gtt and aspirin . GDMT to come as able based on renal function. Change diuresix from drip to IV push.  - Extubated and now on [place], will reduce as able for sats above 92% -  lasix  to 80IV once this morning, consider another dose this afternoon - Abx for CAP, immunocompromise: vancomycin , cefepime , azithromycin  - telemetry - strict Is/Os, weights - watch post-extubation, likely able to leave ICU  Chronic kidney disease stage IIIb Baseline creatinine appears to range mid to low 30s. Near baseline. Will monitor as she is diuresed.  Essential hypertension with hypertensive urgency on admission Does not appear patient is on any antihypertensives at baseline but was recently started on Demadex  for lower extremity swelling. Will trend blood pressures.   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 She takes Imbruvica . Query cardiotoxicity from her medicine.   Best Practice (right click and Reselect all SmartList Selections daily)   Diet/type: NPO DVT prophylaxis SCD Pressure ulcer(s): N/A GI prophylaxis: PPI Lines: N/A Foley:  N/A Code Status:  full code Last date of multidisciplinary goals of care discussion: Pending  Labs   CBC: Recent Labs  Lab 10/04/23 1100 10/06/23 0416 10/06/23 0605 10/06/23 0816 10/06/23 0845 10/07/23 0144  WBC 13.7* 26.0*  --  21.5*  --  14.6*  NEUTROABS 10.1*  --   --   --   --   --   HGB 9.0* 9.0* 8.8* 9.2* 8.8* 8.0*  HCT 28.3* 28.3* 26.0* 30.1* 26.0* 25.3*  MCV 88.7 90.4  --  93.2  --  89.7  PLT 147* 235  --  187  --  147*   Basic Metabolic Panel: Recent Labs  Lab 10/04/23 1100 10/06/23 0416 10/06/23 0605 10/06/23 0816 10/06/23 0845 10/06/23 1612 10/07/23 0144  NA 141 139 138  --  140 140 139  K 3.6 3.7 3.6  --  3.1* 4.0 4.0  CL 103 105  --   --   --  107 104  CO2 29 20*  --   --   --  20* 20*  GLUCOSE 103* 124*  --   --   --  118* 99  BUN 24* 29*  --   --   --  31* 33*  CREATININE 1.61* 1.63*  --  1.88*  --  2.07* 2.03*  CALCIUM 8.5* 8.4*  --   --   --  7.9* 8.2*  MG  --   --   --   --   --   --  1.5*  PHOS  --   --   --   --   --   --  5.0*    GFR: Estimated Creatinine Clearance: 17.4 mL/min (A) (by C-G formula based on SCr of 2.03 mg/dL (H)). Recent Labs  Lab 10/04/23 1100 10/06/23 0416 10/06/23 0542 10/06/23 0816 10/06/23 0847 10/06/23 0950 10/06/23 1059 10/07/23 0144  PROCALCITON  --   --   --  7.35  --   --   --   --   WBC 13.7* 26.0*  --  21.5*  --   --   --  14.6*  LATICACIDVEN  --   --  2.7*  --  2.0* 2.3* 2.0*  --    Liver Function Tests: Recent Labs  Lab 10/04/23 1100 10/06/23 0416  AST 16 39  ALT 11 23  ALKPHOS 109 108  BILITOT 0.7 1.3*  PROT 5.8*  5.7*  ALBUMIN  3.4* 2.9*   No results for input(s): LIPASE, AMYLASE in the last 168 hours. No results for input(s): AMMONIA in the last 168 hours.  ABG    Component Value Date/Time   PHART 7.260 (L) 10/06/2023 0845   PCO2ART 50.6 (H) 10/06/2023 0845   PO2ART 127 (H) 10/06/2023 0845   HCO3 22.6 10/06/2023 0845   TCO2 24 10/06/2023 0845   ACIDBASEDEF 4.0 (H) 10/06/2023 0845   O2SAT 98 10/06/2023 0845    Coagulation Profile: Recent Labs  Lab 10/06/23 0416  INR 1.2   Cardiac Enzymes: No results for input(s): CKTOTAL, CKMB, CKMBINDEX, TROPONINI in the last 168 hours.  HbA1C: Hgb A1c MFr Bld  Date/Time Value Ref Range Status  06/14/2023 11:34 AM 5.7 (H) <5.7 % of total Hgb Final    Comment:    For someone without known diabetes, a hemoglobin  A1c value between 5.7% and 6.4% is consistent with prediabetes and should be confirmed with a  follow-up test. . For someone with known diabetes, a value <7% indicates that their diabetes is well controlled. A1c targets should be individualized based on duration of diabetes, age, comorbid conditions, and other considerations. . This assay result is consistent with an increased risk of diabetes. . Currently, no consensus exists regarding use of hemoglobin A1c for diagnosis of diabetes for children. .   02/14/2023 11:19 AM 6.1 (H) <5.7 % of total Hgb Final    Comment:    For  someone without known diabetes, a hemoglobin  A1c value between 5.7% and 6.4% is consistent with prediabetes and should be confirmed with a  follow-up test. . For someone with known diabetes, a value <7% indicates that their diabetes is well controlled. A1c targets should be individualized based on duration of diabetes, age, comorbid conditions, and other considerations. . This assay result is consistent with an increased risk of diabetes. . Currently, no consensus exists regarding use of hemoglobin A1c for diagnosis of diabetes for children. .    CBG: Recent Labs  Lab 10/06/23 1118 10/06/23 1515 10/06/23 1926 10/06/23 2326 10/07/23 0339  GLUCAP 126* 109* 113* 102* 102*   Review of Systems:   Denies discomfort, dyspnea, pain.  Past Medical History:  She,  has a past medical history of CLL (chronic lymphocytic leukemia) (HCC), Dyspnea (2021), Early cataracts, bilateral, Elevated hemoglobin A1c, Fibroids, GERD (gastroesophageal reflux disease), Hyperlipidemia, Hypertension, Lymphoma (HCC), and Osteopenia.   Surgical History:   Past Surgical History:  Procedure Laterality Date   ABDOMINAL HYSTERECTOMY     APPENDECTOMY     BALLOON DILATION N/A 08/31/2023   Procedure: BALLOON DILATION;  Surgeon: Charlanne Groom, MD;  Location: [facility];  Service: Gastroenterology;  Laterality: N/A;   CATARACT EXTRACTION, BILATERAL Bilateral 09/2017   Dr. 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   TONSILLECTOMY AND ADENOIDECTOMY      Social History:   reports that she has never smoked. She has never used smokeless tobacco. She reports that she does not drink alcohol and does not use drugs.   Family History:  Her family history includes  Diabetes in her brother, brother, and sister; Heart disease in her brother and son; Hypertension in her father and son; Leukemia in her mother.   Allergies Allergies  Allergen Reactions  Nexium [Esomeprazole Magnesium ] Other (See Comments)    Headache   Peanut Butter Flavoring Agent (Non-Screening) Itching    Home Medications  Prior to Admission medications   Medication Sig Start Date End Date Taking? Authorizing Provider  pantoprazole  (PROTONIX ) 40 MG tablet Take 1 tablet (40 mg total) by mouth 2 (two) times daily for 56 days, THEN 1 tablet (40 mg total) daily. 09/05/23 11/30/23 Yes Cindy Garnette POUR, MD  polyethylene glycol (MIRALAX  / GLYCOLAX ) 17 g packet Take 17 g by mouth daily as needed for mild constipation or moderate constipation. Also available OTC 06/18/23  Yes Rai, Ripudeep K, MD  prochlorperazine  (COMPAZINE ) 10 MG tablet Take 1 tablet (10 mg total) by mouth every 6 (six) hours as needed for nausea or vomiting. 08/18/23  Yes Onesimo Emaline Brink, MD  torsemide  (DEMADEX ) 20 MG tablet Take 1 tablet (20 mg total) by mouth daily as needed. Take for weight gain > 3 lbs from baseline 140 lbs  or shortness of breath 09/12/23  Yes Ngetich, Dinah C, NP  traMADol  (ULTRAM ) 50 MG tablet Take 1 tablet (50 mg total) by mouth every 8 (eight) hours as needed for moderate pain (pain score 4-6) or severe pain (pain score 7-10) (pain). 06/18/23  Yes Rai, Ripudeep K, MD  B Complex Vitamins (VITAMIN B-COMPLEX PO) Take 1 tablet by mouth daily. Patient not taking: Reported on 10/06/2023    [provider]  entecavir  (BARACLUDE ) 0.5 MG tablet Take 1 tablet (0.5 mg total) by mouth every 3 (three) days. Patient not taking: Reported on 10/06/2023 09/08/23 10/08/23  Cindy Garnette POUR, MD  loratadine (CLARITIN) 10 MG tablet Take 10 mg by mouth daily. Patient not taking: Reported on 10/06/2023    [provider]  senna-docusate (SENOKOT-S) 8.6-50 MG tablet Take 2 tablets by mouth at bedtime. For  constipation Patient not taking: Reported on 10/06/2023 06/18/23   Rai, Nydia POUR, MD  sucralfate  (CARAFATE ) 1 GM/10ML suspension Take 10 mLs (1 g total) by mouth 2 (two) times daily for 28 days. Patient not taking: Reported on 10/04/2023 09/05/23 10/03/23  Cindy Garnette POUR, MD  gabapentin  (NEURONTIN ) 100 MG capsule Take 1 capsule 3 x /day for Neuropathy Pain Patient not taking: Reported on 01/25/2020 08/02/19 01/25/20  Tonita Fallow, MD    Critical care time: 35 mins    Lonni Africa, DO IM Resident PGY-2

## 2023-10-07 NOTE — Plan of Care (Signed)
  Problem: Education: Goal: Knowledge of General Education information will improve Description: Including pain rating scale, medication(s)/side effects and non-pharmacologic comfort measures Outcome: Progressing   Problem: Health Behavior/Discharge Planning: Goal: Ability to manage health-related needs will improve Outcome: Progressing   Problem: Clinical Measurements: Goal: Ability to maintain clinical measurements within normal limits will improve Outcome: Progressing Goal: Will remain free from infection Outcome: Progressing   Problem: Pain Managment: Goal: General experience of comfort will improve and/or be controlled Outcome: Progressing   Problem: Safety: Goal: Ability to remain free from injury will improve Outcome: Progressing   Problem: Skin Integrity: Goal: Risk for impaired skin integrity will decrease Outcome: Progressing   Problem: Education: Goal: Ability to describe self-care measures that may prevent or decrease complications (Diabetes Survival Skills Education) will improve Outcome: Progressing Goal: Individualized Educational Video(s) Outcome: Progressing

## 2023-10-07 NOTE — Progress Notes (Addendum)
 Rounding Note   Patient Name: Jacqueline Hancock Date of Encounter: 10/07/2023  [facility] Cardiologist: None   Subjective Net negative 1.7L yesterday.  Cr stable at 2.0. She is alert this morning, following commands. On Fio2 40%, PEEP 5.   Scheduled Meds:  Chlorhexidine  Gluconate Cloth  6 each Topical Daily   famotidine   10 mg Per Tube Daily   insulin  aspart  0-9 Units Subcutaneous Q4H   mupirocin  ointment  1 Application Nasal BID   mouth rinse  15 mL Mouth Rinse Q2H   sodium chloride  flush  10-40 mL Intracatheter Q12H   Continuous Infusions:  ceFEPime  (MAXIPIME ) IV Stopped (10/07/23 0700)   fentaNYL  infusion INTRAVENOUS Stopped (10/07/23 0803)   furosemide  (LASIX ) 200 mg in dextrose  5 % 100 mL (2 mg/mL) infusion 12 mg/hr (10/07/23 0800)   heparin  800 Units/hr (10/07/23 0800)   propofol  (DIPRIVAN ) infusion Stopped (10/07/23 0802)   [START ON 10/08/2023] vancomycin      PRN Meds: docusate, fentaNYL , mouth rinse, polyethylene glycol, sodium chloride  flush   Vital Signs  Vitals:   10/07/23 0600 10/07/23 0700 10/07/23 0747 10/07/23 0800  BP: 133/79 (!) 151/77  (!) 151/90  Pulse: 92 99 (!) 103 (!) 102  Resp: 14 (!) 24 (!) 25 (!) 27  Temp: 100 F (37.8 C) 99.9 F (37.7 C) 99.7 F (37.6 C) 99.7 F (37.6 C)  TempSrc:      SpO2: 99% 96% 100% 100%  Weight:      Height:        Intake/Output Summary (Last 24 hours) at 10/07/2023 0820 Last data filed at 10/07/2023 0800 Gross per 24 hour  Intake 1202.76 ml  Output 3125 ml  Net -1922.24 ml      10/06/2023   10:48 AM 10/04/2023    1:24 PM 10/04/2023   11:14 AM  Last 3 Weights  Weight (lbs) 146 lb 2.6 oz 141 lb 198 lb 6.4 oz  Weight (kg) 66.3 kg 63.957 kg 89.994 kg      Telemetry NSR - Personally Reviewed  ECG  No new ECG - Personally Reviewed  Physical Exam GEN: Intubated, alert Neck: No JVD Cardiac: RRR, no murmurs Respiratory: Mechanical breath sounds GI: Soft, nontender MS: No edema Neuro:  Alert, follows  commands Psych: Unable to assess  Labs High Sensitivity Troponin:   Recent Labs  Lab 10/06/23 0416 10/06/23 0615 10/06/23 0816 10/06/23 1059  TROPONINIHS 63* 343* 1,163* 1,186*     Chemistry Recent Labs  Lab 10/04/23 1100 10/04/23 1100 10/06/23 0416 10/06/23 0605 10/06/23 0816 10/06/23 0845 10/06/23 1612 10/07/23 0144  NA 141  --  139   < >  --  140 140 139  K 3.6  --  3.7   < >  --  3.1* 4.0 4.0  CL 103  --  105  --   --   --  107 104  CO2 29  --  20*  --   --   --  20* 20*  GLUCOSE 103*  --  124*  --   --   --  118* 99  BUN 24*  --  29*  --   --   --  31* 33*  CREATININE 1.61*   < > 1.63*  --  1.88*  --  2.07* 2.03*  CALCIUM 8.5*  --  8.4*  --   --   --  7.9* 8.2*  MG  --   --   --   --   --   --   --  1.5*  PROT 5.8*  --  5.7*  --   --   --   --   --   ALBUMIN  3.4*  --  2.9*  --   --   --   --   --   AST 16  --  39  --   --   --   --   --   ALT 11  --  23  --   --   --   --   --   ALKPHOS 109  --  108  --   --   --   --   --   BILITOT 0.7  --  1.3*  --   --   --   --   --   GFRNONAA 32*   < > 31*  --  26*  --  23* 24*  ANIONGAP 9  --  14  --   --   --  13 15   < > = values in this interval not displayed.    Lipids No results for input(s): CHOL, TRIG, HDL, LABVLDL, LDLCALC, CHOLHDL in the last 168 hours.  Hematology Recent Labs  Lab 10/06/23 0416 10/06/23 0605 10/06/23 0816 10/06/23 0845 10/07/23 0144  WBC 26.0*  --  21.5*  --  14.6*  RBC 3.13*  --  3.23*  --  2.82*  HGB 9.0*   < > 9.2* 8.8* 8.0*  HCT 28.3*   < > 30.1* 26.0* 25.3*  MCV 90.4  --  93.2  --  89.7  MCH 28.8  --  28.5  --  28.4  MCHC 31.8  --  30.6  --  31.6  RDW 15.2  --  15.3  --  15.0  PLT 235  --  187  --  147*   < > = values in this interval not displayed.   Thyroid  No results for input(s): TSH, FREET4 in the last 168 hours.  BNP Recent Labs  Lab 10/06/23 0416  BNP 1,289.3*    DDimer No results for input(s): DDIMER in the last 168 hours.   Radiology   ECHOCARDIOGRAM LIMITED Result Date: 10/06/2023    ECHOCARDIOGRAM LIMITED REPORT   Patient Name:   Jacqueline Hancock Date of Exam: 10/06/2023 Medical Rec #:  [id number]   Height:       59.0 in Accession #:    [id number]  Weight:       146.2 lb Date of Birth:  [birth year]   BSA:          1.614 m Patient Age:    83 years    BP:           187/68 mmHg Patient Gender: F           HR:           87 bpm. Exam Location:  Inpatient Procedure: Limited Echo (Both Spectral and Color Flow Doppler were utilized            during procedure). Indications:    Flash pulmonary edema (HCC)  History:        Patient has prior history of Echocardiogram examinations, most                 recent 08/22/2023. Signs/Symptoms:Dyspnea; Risk                 Factors:Hypertension.  Sonographer:    Jayson Gaskins Referring Phys: [phone] WHITNEY D HARRIS IMPRESSIONS  1. Limited Echocardiogram.  2.  The left ventricle demonstrates both global and regional wall motion abnormalities (see scoring diagram/findings for description). The apex is akinetic. The entire lateral wall, anterior septum, and basal inferior segment are hypokinetic. The inferior septum and mid and distal inferior wall are normal. . Left ventricular ejection fraction, by estimation, is 25 to 30%. The left ventricle has severely decreased function. Left ventricular diastolic function could not be evaluated.  3. The mitral valve is grossly normal.  4. The aortic valve was not well visualized. Aortic valve regurgitation is not visualized. Aortic valve sclerosis is present, with no evidence of aortic valve stenosis.  5. The inferior vena cava is normal in size with greater than 50% respiratory variability, suggesting right atrial pressure of 3 mmHg. Comparison(s): A prior study was performed on 08/22/2023. LVEF has decreased (from 60-65% to 25-30%). FINDINGS  Left Ventricle: The left ventricle demonstrates both global and regional wall motion abnormalities (see scoring diagram/findings for description).  The apex is akinetic. The entire lateral wall, anterior septum, and basal inferior segment are hypokinetic. The inferior septum and mid and distal inferior wall are normal. Left ventricular ejection fraction, by estimation, is 25 to 30%. The left ventricle has severely decreased function. The left ventricular internal cavity size was normal in size. There is no left ventricular hypertrophy. Left ventricular diastolic function could not be evaluated. Left ventricular diastolic function could not be evaluated due to nondiagnostic images.  LV Wall Scoring: The apex is akinetic. The entire lateral wall, anterior septum, and basal inferior segment are hypokinetic. The inferior septum and mid and distal inferior wall are normal. Pericardium: There is no evidence of pericardial effusion. Mitral Valve: The mitral valve is grossly normal. Tricuspid Valve: The tricuspid valve is grossly normal. Aortic Valve: The aortic valve was not well visualized. Aortic valve regurgitation is not visualized. Aortic valve sclerosis is present, with no evidence of aortic valve stenosis. Venous: The inferior vena cava is normal in size with greater than 50% respiratory variability, suggesting right atrial pressure of 3 mmHg. LEFT VENTRICLE PLAX 2D LVIDd:         4.40 cm LVIDs:         4.00 cm LV PW:         0.90 cm LV IVS:        0.90 cm LVOT diam:     1.70 cm LVOT Area:     2.27 cm  LEFT ATRIUM           Index        RIGHT ATRIUM          Index LA Vol (A4C): 33.4 ml 20.69 ml/m  RA Area:     5.84 cm                                    RA Volume:   7.51 ml  4.65 ml/m   SHUNTS Systemic Diam: 1.70 cm Sunit Tolia Electronically signed by Madonna Large Signature Date/Time: 10/06/2023/4:27:49 PM    Final    DG Chest Portable 1 View Result Date: 10/06/2023 CLINICAL DATA:  83 year old female intubated, enteric tube placement. EXAM: PORTABLE CHEST 1 VIEW COMPARISON:  0426 hours today, CTA chest today 10/06/2023. FINDINGS: Portable AP supine view at  0655 hours. Rotated to the right now. Endotracheal tube tip in good position between the clavicles and carina. Enteric tube courses to the left abdomen, tip not included. Stable right chest power  port. Mildly lower lung volumes. Stable cardiac size and mediastinal contours. Increased mid and lower lung vague and interstitial opacity, symmetric. No pneumothorax or pleural effusion identified on this supine view. Negative visible bowel gas. Stable visualized osseous structures. IMPRESSION: 1. Satisfactory ET tube and Enteric tube. 2. Worsening symmetric perihilar and lower lung vague and interstitial opacity, CTA appearance today suggesting acute pulmonary edema (please see that report). Electronically Signed   By: VEAR Hurst M.D.   On: 10/06/2023 07:26   DG Chest Port 1 View Result Date: 10/06/2023 CLINICAL DATA:  Increasing central chest pain with shortness of breath. EXAM: PORTABLE CHEST 1 VIEW COMPARISON:  08/28/2023 FINDINGS: Cardiopericardial silhouette is at upper limits of normal for size. There is pulmonary vascular congestion without overt pulmonary edema. Component of interstitial pulmonary edema suspected. Tiny right pleural effusion evident. Right Port-A-Cath remains in place. IMPRESSION: Pulmonary vascular congestion with component of interstitial pulmonary edema and tiny right pleural effusion. Electronically Signed   By: Camellia Candle M.D.   On: 10/06/2023 06:17   CT Angio Chest Pulmonary Embolism (PE) W or WO Contrast Result Date: 10/06/2023 CLINICAL DATA:  Bowel obstruction suspected PE suspected, respiratory distress, query worsening tumor burden lymphoma * Tracking Code: BO * EXAM: CT ANGIOGRAPHY CHEST CT ABDOMEN AND PELVIS WITH CONTRAST TECHNIQUE: Multidetector CT imaging of the chest was performed using the standard protocol during bolus administration of intravenous contrast. Multiplanar CT image reconstructions and MIPs were obtained to evaluate the vascular anatomy. Multidetector CT imaging  of the abdomen and pelvis was performed using the standard protocol during bolus administration of intravenous contrast. RADIATION DOSE REDUCTION: This exam was performed according to the departmental dose-optimization program which includes automated exposure control, adjustment of the mA and/or kV according to patient size and/or use of iterative reconstruction technique. CONTRAST:  75mL OMNIPAQUE  IOHEXOL  350 MG/ML SOLN COMPARISON:  CT abdomen pelvis, 08/28/2023 PET-CT, 07/17/2018 FINDINGS: CT CHEST ANGIOGRAM FINDINGS Cardiovascular: Satisfactory opacification of the pulmonary arteries to the segmental level. No evidence of pulmonary embolism. Normal heart size. No pericardial effusion. Right chest port catheter. Aortic atherosclerosis. Mediastinum/Nodes: No enlarged mediastinal, hilar, or axillary lymph nodes. Thyroid  gland, trachea, and esophagus demonstrate no significant findings. Lungs/Pleura: Diffuse bilateral bronchial wall thickening, interlobular septal thickening, diffusely scattered ground-glass airspace opacity and small bilateral pleural effusions. Musculoskeletal: No chest wall abnormality. No acute osseous findings. Review of the MIP images confirms the above findings. CT ABDOMEN PELVIS FINDINGS Hepatobiliary: No solid liver abnormality is seen. No gallstones, gallbladder wall thickening, or biliary dilatation. Pancreas: Unremarkable. No pancreatic ductal dilatation or surrounding inflammatory changes. Spleen: Normal in size without significant abnormality. Adrenals/Urinary Tract: Adrenal glands are unremarkable. Extremely bulky soft tissue mass again encases the right kidney, not significantly changed, measuring 12.9 x 10.0 cm (series 4, image 44). Dilated right renal calices with overt effacement of the right renal pelvis. Normal left kidney. Bladder is unremarkable. Stomach/Bowel: Stomach is within normal limits. Appendix appears normal. No evidence of bowel wall thickening, distention, or  inflammatory changes. Vascular/Lymphatic: Aortic atherosclerosis. No discretely enlarged abdominal or pelvic lymph nodes. Reproductive: No mass or other significant abnormality. Other: No abdominal wall hernia. Severe anasarca. Small volume ascites. Musculoskeletal: No acute or significant osseous findings. IMPRESSION: 1. Negative examination for pulmonary embolism. 2. Diffuse bilateral bronchial wall thickening, interlobular septal thickening, diffusely scattered ground-glass airspace opacity and small bilateral pleural effusions. Findings consistent with pulmonary edema. Drug toxicity is a differential consideration in the setting of chemotherapy. 3. Extremely bulky soft tissue mass again encases  the right kidney, not significantly changed, measuring 12.9 x 10.0 cm. Dilated right renal calices with overt effacement of the right renal pelvis. 4. Severe anasarca.  Small volume ascites. Aortic Atherosclerosis (ICD10-I70.0). Electronically Signed   By: Marolyn JONETTA Jaksch M.D.   On: 10/06/2023 05:52   CT ABDOMEN PELVIS W CONTRAST Result Date: 10/06/2023 CLINICAL DATA:  Bowel obstruction suspected PE suspected, respiratory distress, query worsening tumor burden lymphoma * Tracking Code: BO * EXAM: CT ANGIOGRAPHY CHEST CT ABDOMEN AND PELVIS WITH CONTRAST TECHNIQUE: Multidetector CT imaging of the chest was performed using the standard protocol during bolus administration of intravenous contrast. Multiplanar CT image reconstructions and MIPs were obtained to evaluate the vascular anatomy. Multidetector CT imaging of the abdomen and pelvis was performed using the standard protocol during bolus administration of intravenous contrast. RADIATION DOSE REDUCTION: This exam was performed according to the departmental dose-optimization program which includes automated exposure control, adjustment of the mA and/or kV according to patient size and/or use of iterative reconstruction technique. CONTRAST:  75mL OMNIPAQUE  IOHEXOL  350  MG/ML SOLN COMPARISON:  CT abdomen pelvis, 08/28/2023 PET-CT, 07/17/2018 FINDINGS: CT CHEST ANGIOGRAM FINDINGS Cardiovascular: Satisfactory opacification of the pulmonary arteries to the segmental level. No evidence of pulmonary embolism. Normal heart size. No pericardial effusion. Right chest port catheter. Aortic atherosclerosis. Mediastinum/Nodes: No enlarged mediastinal, hilar, or axillary lymph nodes. Thyroid  gland, trachea, and esophagus demonstrate no significant findings. Lungs/Pleura: Diffuse bilateral bronchial wall thickening, interlobular septal thickening, diffusely scattered ground-glass airspace opacity and small bilateral pleural effusions. Musculoskeletal: No chest wall abnormality. No acute osseous findings. Review of the MIP images confirms the above findings. CT ABDOMEN PELVIS FINDINGS Hepatobiliary: No solid liver abnormality is seen. No gallstones, gallbladder wall thickening, or biliary dilatation. Pancreas: Unremarkable. No pancreatic ductal dilatation or surrounding inflammatory changes. Spleen: Normal in size without significant abnormality. Adrenals/Urinary Tract: Adrenal glands are unremarkable. Extremely bulky soft tissue mass again encases the right kidney, not significantly changed, measuring 12.9 x 10.0 cm (series 4, image 44). Dilated right renal calices with overt effacement of the right renal pelvis. Normal left kidney. Bladder is unremarkable. Stomach/Bowel: Stomach is within normal limits. Appendix appears normal. No evidence of bowel wall thickening, distention, or inflammatory changes. Vascular/Lymphatic: Aortic atherosclerosis. No discretely enlarged abdominal or pelvic lymph nodes. Reproductive: No mass or other significant abnormality. Other: No abdominal wall hernia. Severe anasarca. Small volume ascites. Musculoskeletal: No acute or significant osseous findings. IMPRESSION: 1. Negative examination for pulmonary embolism. 2. Diffuse bilateral bronchial wall thickening,  interlobular septal thickening, diffusely scattered ground-glass airspace opacity and small bilateral pleural effusions. Findings consistent with pulmonary edema. Drug toxicity is a differential consideration in the setting of chemotherapy. 3. Extremely bulky soft tissue mass again encases the right kidney, not significantly changed, measuring 12.9 x 10.0 cm. Dilated right renal calices with overt effacement of the right renal pelvis. 4. Severe anasarca.  Small volume ascites. Aortic Atherosclerosis (ICD10-I70.0). Electronically Signed   By: Marolyn JONETTA Jaksch M.D.   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  She was intubated due to hypoxic respiratory failure.   Assessment & Plan  NSTEMI c/w ADHF: Presented with chest pain and SOB, intubated on 7/4.  EKG showed sinus tachycardia, no ischemia. Hs troponin elevated, 63>>343>>1186.  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  -Complicated situation.  She is on chemotherapy and has had recent anemia with Hgb down to 6.2 on 6/19, will need discussion with her hematologist prior to considering cath.  Would also need stablre respiratory status and renal function prior to cath.  Will manage medically for now. Continue heparin  gtt, ASA   Acute hypoxic respiratory failure secondary to flash pulmonary edema: Intubated now, presented with chest pains and SOB. Echo from May 2025 showed normal LVEF, normal RV function, mild to moderate MR, trivial AI and CVP 3 mmHg.  Limited echocardiogram obtained 7/4 showed LVEF 25 to 30% with RWMA and CVP 3 mmHg.  Imaging evidence of flash pulmonary edema/pulmonary vascular congestion, severe anasarca, small ascites and small bilateral pleural effusions present. BNP elevated, 1289.  Serum creatinine increased from 1.6 in the ER to 2.07 upon arrival to the ICU.   -She  was started on IV Lasix  drip at 12 mg/h.  Good diuresis, discussed with PCCM team and likely planning extubation.  Agree with switching to IV lasix  80 mg daily   Acute systolic heart failure: LVEF 25-30%.  On chemotherapy for lymphoma.  Could be cardiotoxicity from chemo vs ischemia. 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   CRITICAL CARE TIME: I have spent a total of 35 minutes with patient reviewing hospital notes, telemetry, EKGs, labs and examining the patient as well as establishing an assessment and plan that was discussed with the patient and her daughter in law as well as PCCM team.  > 50% of time was spent in direct patient care. The patient is critically ill with multi-organ system failure and requires high complexity decision making for assessment and support, frequent evaluation and titration of therapies, application of advanced monitoring technologies and extensive interpretation of multiple databases.    For questions or updates, please contact [facility] Please consult [email or web address] for contact info under     Signed, Lonni LITTIE [name], MD  10/07/2023, 8:20 AM

## 2023-10-07 NOTE — Progress Notes (Signed)
 eLink Physician-Brief Progress Note Patient Name: Jacqueline Hancock DOB: 1940/11/12 MRN: [id number]   Date of Service  10/07/2023  HPI/Events of Note  mag 1.5, cret 2.03  eICU Interventions  Magnesium  Sulfate     Intervention Category Minor Interventions: Electrolytes abnormality - evaluation and management  [name] 10/07/2023, 3:37 AM

## 2023-10-07 NOTE — Procedures (Signed)
 Extubation Procedure Note  Patient Details:   Name: Jacqueline Hancock DOB: 10-30-1940 MRN: [id number]   Airway Documentation:    Vent end date: 10/07/23 Vent end time: 0923   Evaluation  O2 sats: stable throughout Complications: No apparent complications Patient did tolerate procedure well. Cuff leak present prior to extubation. No stridor noted, BBS clear, able to attain 800 on incentive spirometer. Will continue to monitor and work with IS. Bilateral Breath Sounds: Clear   Yes  Corean FORBES Cavalier 10/07/2023, 9:24 AM

## 2023-10-08 DIAGNOSIS — I214 Non-ST elevation (NSTEMI) myocardial infarction: Secondary | ICD-10-CM

## 2023-10-08 DIAGNOSIS — J9602 Acute respiratory failure with hypercapnia: Secondary | ICD-10-CM

## 2023-10-08 DIAGNOSIS — D649 Anemia, unspecified: Secondary | ICD-10-CM

## 2023-10-08 DIAGNOSIS — J9601 Acute respiratory failure with hypoxia: Secondary | ICD-10-CM

## 2023-10-08 DIAGNOSIS — N179 Acute kidney failure, unspecified: Secondary | ICD-10-CM

## 2023-10-08 DIAGNOSIS — I5021 Acute systolic (congestive) heart failure: Secondary | ICD-10-CM

## 2023-10-08 LAB — CBC
HCT: 22 % — ABNORMAL LOW (ref 36.0–46.0)
Hemoglobin: 7 g/dL — ABNORMAL LOW (ref 12.0–15.0)
MCH: 28.2 pg (ref 26.0–34.0)
MCHC: 31.8 g/dL (ref 30.0–36.0)
MCV: 88.7 fL (ref 80.0–100.0)
Platelets: 148 K/uL — ABNORMAL LOW (ref 150–400)
RBC: 2.48 MIL/uL — ABNORMAL LOW (ref 3.87–5.11)
RDW: 15 % (ref 11.5–15.5)
WBC: 7.7 K/uL (ref 4.0–10.5)
nRBC: 0 % (ref 0.0–0.2)

## 2023-10-08 LAB — HEPARIN LEVEL (UNFRACTIONATED): Heparin Unfractionated: 0.29 [IU]/mL — ABNORMAL LOW (ref 0.30–0.70)

## 2023-10-08 LAB — BASIC METABOLIC PANEL WITH GFR
Anion gap: 14 (ref 5–15)
BUN: 35 mg/dL — ABNORMAL HIGH (ref 8–23)
CO2: 25 mmol/L (ref 22–32)
Calcium: 8.4 mg/dL — ABNORMAL LOW (ref 8.9–10.3)
Chloride: 99 mmol/L (ref 98–111)
Creatinine, Ser: 2.07 mg/dL — ABNORMAL HIGH (ref 0.44–1.00)
GFR, Estimated: 23 mL/min — ABNORMAL LOW (ref >60)
Glucose, Bld: 101 mg/dL — ABNORMAL HIGH (ref 70–99)
Potassium: 3.2 mmol/L — ABNORMAL LOW (ref 3.5–5.1)
Sodium: 138 mmol/L (ref 135–145)

## 2023-10-08 LAB — LIPID PANEL
Cholesterol: 165 mg/dL (ref 0–200)
HDL: 34 mg/dL — ABNORMAL LOW (ref >40)
LDL Cholesterol: 102 mg/dL — ABNORMAL HIGH (ref 0–99)
Total CHOL/HDL Ratio: 4.9 ratio
Triglycerides: 147 mg/dL (ref <150)
VLDL: 29 mg/dL (ref 0–40)

## 2023-10-08 LAB — MAGNESIUM
Magnesium: 2.2 mg/dL (ref 1.7–2.4)
Magnesium: 2 mg/dL (ref 1.7–2.4)

## 2023-10-08 LAB — PREPARE RBC (CROSSMATCH)

## 2023-10-08 MED ORDER — GUAIFENESIN-DM 100-10 MG/5ML PO SYRP
10.0000 mL | ORAL_SOLUTION | ORAL | Status: DC | PRN
  Administered 2023-10-08: 10 mL via ORAL
  Filled 2023-10-08: qty 10

## 2023-10-08 MED ORDER — ALBUTEROL SULFATE (2.5 MG/3ML) 0.083% IN NEBU
2.5000 mg | INHALATION_SOLUTION | RESPIRATORY_TRACT | Status: DC | PRN
  Administered 2023-10-08: 2.5 mg via RESPIRATORY_TRACT
  Filled 2023-10-08: qty 3

## 2023-10-08 MED ORDER — POTASSIUM CHLORIDE CRYS ER 20 MEQ PO TBCR
40.0000 meq | EXTENDED_RELEASE_TABLET | Freq: Two times a day (BID) | ORAL | Status: AC
  Administered 2023-10-08 (×2): 40 meq via ORAL
  Filled 2023-10-08 (×2): qty 2

## 2023-10-08 MED ORDER — SODIUM CHLORIDE 0.9% IV SOLUTION: Freq: Once | INTRAVENOUS | Status: DC

## 2023-10-08 MED ORDER — FUROSEMIDE 10 MG/ML IJ SOLN
80.0000 mg | Freq: Every day | INTRAMUSCULAR | Status: DC
  Administered 2023-10-08 – 2023-10-12 (×5): 80 mg via INTRAVENOUS
  Filled 2023-10-08 (×5): qty 8

## 2023-10-08 MED ORDER — ACETAMINOPHEN 325 MG PO TABS
650.0000 mg | ORAL_TABLET | Freq: Four times a day (QID) | ORAL | Status: DC | PRN
  Administered 2023-10-10: 650 mg via ORAL
  Filled 2023-10-08 (×2): qty 2

## 2023-10-08 NOTE — Evaluation (Signed)
 Physical Therapy Evaluation Patient Details Name: Jacqueline Hancock MRN: [id number] DOB: 20-Dec-1940 Today's Date: 10/08/2023  History of Present Illness  83 year old female admitted to ICU on 7/4 with sudden onset of shortness of breath where she woke up from sleep with extreme shortness of breath.  In the emergency room, she was hypotensive and tachycardic.  CT angiogram was negative for PE but consistent with bilateral pulmonary edema.  Initially started on BiPAP with improvement of work of breathing, however became more somnolent so was intubated for hypercapnia. Found to have acute resp failure due to NSTEMI.   Patient was treated for flash pulmonary edema, found to have acute congestive heart failure.  Ultimately extubated 7/5 and transferred out of ICU. 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 Pt was able to ambulate a short distance in room with RW. Pt is likely close to baseline and has 24 hour care at home.  Would benefit from therapy therefore recommend HHPT and will follow in hospital.   Pt currently with functional limitations due to the deficits listed below (see PT Problem List). Pt will benefit from acute skilled PT to increase their independence and safety with mobility to allow discharge.           If plan is discharge home, recommend the following: A little help with walking and/or transfers;A little help with bathing/dressing/bathroom;Assistance with cooking/housework;Assist for transportation;Help with stairs or ramp for entrance   Can travel by private vehicle        Equipment Recommendations BSC/3in1  Recommendations for Other Services       Functional Status Assessment Patient has had a recent decline in their functional status and demonstrates the ability to make significant improvements in function in a reasonable  and predictable amount of time.     Precautions / Restrictions Precautions Precautions: Fall Restrictions Weight Bearing Restrictions Per Provider Order: No      Mobility  Bed Mobility Overal bed mobility: Independent                  Transfers Overall transfer level: Needs assistance Equipment used: Rolling walker (2 wheels) Transfers: Sit to/from Stand Sit to Stand: Min assist           General transfer comment: Little assist given to power up and cues for hand placement. Pt with slight posterior lean    Ambulation/Gait Ambulation/Gait assistance: Contact guard assist, +2 safety/equipment Gait Distance (Feet): 15 Feet Assistive device: Rolling walker (2 wheels) Gait Pattern/deviations: Step-through pattern, Decreased stride length, Leaning posteriorly, Trunk flexed   Gait velocity interpretation: 1.31 - 2.62 ft/sec, indicative of limited community ambulator   General Gait Details: Pt ambulates with slight flexed posture and slow gait. Needed cues to steer RW. Pt was able to ambualte a short distance around bed to the recliner.  Stairs            Wheelchair Mobility     Tilt Bed    Modified Rankin (Stroke Patients Only)       Balance Overall balance assessment: Needs assistance Sitting-balance support: No upper extremity supported, Feet supported Sitting balance-Leahy Scale: Fair     Standing balance support: Bilateral upper extremity supported, During functional activity Standing balance-Leahy Scale: Poor Standing balance comment: relies on UE support and RW  Pertinent Vitals/Pain Pain Assessment Pain Assessment: No/denies pain    Home Living Family/patient expects to be discharged to:: Private residence Living Arrangements: Children Available Help at Discharge: Family;Available PRN/intermittently (1 son is retired and provides 24 hr care between several family members.) Type of Home: House Home  Access: Stairs to enter Entrance Stairs-Rails: Lawyer of Steps: 2   Home Layout: One level Home Equipment: Agricultural consultant (2 wheels);Shower seat Additional Comments: Pt's son has been staying with her d/t worsening symptoms.    Prior Function Prior Level of Function : Needs assist             Mobility Comments: Gets up to her recliner and typically stays there during the day. uses rw to walk to bathroom. ADLs Comments: recently has been needing some assist with showers. Noted last admission about a month ago pt's PLOF was mod I.     Extremity/Trunk Assessment   Upper Extremity Assessment Upper Extremity Assessment: Defer to OT evaluation    Lower Extremity Assessment Lower Extremity Assessment: Generalized weakness    Cervical / Trunk Assessment Cervical / Trunk Assessment: Kyphotic  Communication   Communication Communication: No apparent difficulties    Cognition Arousal: Alert Behavior During Therapy: WFL for tasks assessed/performed   PT - Cognitive impairments: No apparent impairments                         Following commands: Intact       Cueing       General Comments General comments (skin integrity, edema, etc.): VSS    Exercises General Exercises - Lower Extremity Ankle Circles/Pumps: AROM, Both, 10 reps, Seated Quad Sets: AROM, Both, 5 reps, Supine Gluteal Sets: AROM, Both, 5 reps, Supine Heel Slides: AROM, Both, 10 reps, Supine   Assessment/Plan    PT Assessment Patient needs continued PT services  PT Problem List Decreased activity tolerance;Decreased balance;Decreased mobility;Decreased knowledge of use of DME;Decreased safety awareness;Decreased knowledge of precautions       PT Treatment Interventions DME instruction;Gait training;Functional mobility training;Therapeutic activities;Balance training;Therapeutic exercise;Neuromuscular re-education;Patient/family education;Stair training    PT Goals  (Current goals can be found in the Care Plan section)  Acute Rehab PT Goals Patient Stated Goal: to go home PT Goal Formulation: With patient Time For Goal Achievement: 10/22/23 Potential to Achieve Goals: Good    Frequency Min 2X/week     Co-evaluation               AM-PAC PT 6 Clicks Mobility  Outcome Measure Help needed turning from your back to your side while in a flat bed without using bedrails?: None Help needed moving from lying on your back to sitting on the side of a flat bed without using bedrails?: None Help needed moving to and from a bed to a chair (including a wheelchair)?: A Little Help needed standing up from a chair using your arms (e.g., wheelchair or bedside chair)?: A Little Help needed to walk in hospital room?: A Little Help needed climbing 3-5 steps with a railing? : A Lot 6 Click Score: 19    End of Session Equipment Utilized During Treatment: Gait belt Activity Tolerance: Patient limited by fatigue Patient left: in chair;with call bell/phone within reach;with chair alarm set;with family/visitor present Nurse Communication: Mobility status PT Visit Diagnosis: Muscle weakness (generalized) (M62.81)    Time: 8867-8851 PT Time Calculation (min) (ACUTE ONLY): 16 min   Charges:   PT Evaluation $PT Eval Moderate Complexity: 1  Mod   PT General Charges $$ ACUTE PT VISIT: 1 Visit         Eldor Conaway M,PT Acute Rehab Services [phone]   Stephane JULIANNA Bevel 10/08/2023, 2:29 PM

## 2023-10-08 NOTE — Progress Notes (Signed)
 eLink Physician-Brief Progress Note Patient Name: Jacqueline Hancock DOB: 1941/03/26 MRN: [id number]   Date of Service  10/08/2023  HPI/Events of Note  RN reports fever.  eICU Interventions  Tylenol  ordered.     Intervention Category Minor Interventions: Routine modifications to care plan (e.g. PRN medications for pain, fever)  Jerilynn Berg 10/08/2023, 3:55 AM

## 2023-10-08 NOTE — Progress Notes (Signed)
 Rounding Note   Patient Name: Jacqueline Hancock Date of Encounter: 10/08/2023  [facility] Cardiologist: None   Subjective Extubated yesterday, now on room air.  She was net -2.9 L yesterday, -4.5 L on admission.  Creatinine stable at 2.07.  Hemoglobin down to 7.0.  She reports feeling improved but still with some dyspnea   Scheduled Meds:  aspirin   81 mg Oral Daily   Chlorhexidine  Gluconate Cloth  6 each Topical Daily   mupirocin  ointment  1 Application Nasal BID   sodium chloride  flush  10-40 mL Intracatheter Q12H   Continuous Infusions:  azithromycin  Stopped (10/07/23 1023)   ceFEPime  (MAXIPIME ) IV 2 g (10/08/23 0457)   heparin  800 Units/hr (10/07/23 1805)   vancomycin      PRN Meds: acetaminophen , docusate, polyethylene glycol, sodium chloride  flush   Vital Signs  Vitals:   10/07/23 2336 10/08/23 0317 10/08/23 0456 10/08/23 0642  BP:      Pulse: 100 94  98  Resp:      Temp: 99.9 F (37.7 C) 100.3 F (37.9 C) 98.7 F (37.1 C)   TempSrc: Oral  Oral   SpO2: 92% 92%  94%  Weight:    60.1 kg  Height:        Intake/Output Summary (Last 24 hours) at 10/08/2023 9287 Last data filed at 10/08/2023 0500 Gross per 24 hour  Intake 347.12 ml  Output 3275 ml  Net -2927.88 ml      10/08/2023    6:42 AM 10/07/2023    5:07 PM 10/06/2023   10:48 AM  Last 3 Weights  Weight (lbs) 132 lb 7.9 oz 134 lb 4.2 oz 146 lb 2.6 oz  Weight (kg) 60.1 kg 60.9 kg 66.3 kg      Telemetry NSR - Personally Reviewed  ECG  No new ECG - Personally Reviewed  Physical Exam GEN: Intubated, alert Neck: No JVD Cardiac: RRR, no murmurs Respiratory: Mechanical breath sounds GI: Soft, nontender MS: No edema Neuro:  Alert, follows commands Psych: Unable to assess  Labs High Sensitivity Troponin:   Recent Labs  Lab 10/06/23 0416 10/06/23 0615 10/06/23 0816 10/06/23 1059  TROPONINIHS 63* 343* 1,163* 1,186*     Chemistry Recent Labs  Lab 10/04/23 1100 10/06/23 0416 10/06/23 0605  10/06/23 1612 10/07/23 0144 10/08/23 0500  NA 141 139   < > 140 139 138  K 3.6 3.7   < > 4.0 4.0 3.2*  CL 103 105  --  107 104 99  CO2 29 20*  --  20* 20* 25  GLUCOSE 103* 124*  --  118* 99 101*  BUN 24* 29*  --  31* 33* 35*  CREATININE 1.61* 1.63*   < > 2.07* 2.03* 2.07*  CALCIUM 8.5* 8.4*  --  7.9* 8.2* 8.4*  MG  --   --   --   --  1.5* 2.2  PROT 5.8* 5.7*  --   --   --   --   ALBUMIN  3.4* 2.9*  --   --   --   --   AST 16 39  --   --   --   --   ALT 11 23  --   --   --   --   ALKPHOS 109 108  --   --   --   --   BILITOT 0.7 1.3*  --   --   --   --   GFRNONAA 32* 31*   < > 23* 24*  23*  ANIONGAP 9 14  --  13 15 14    < > = values in this interval not displayed.    Lipids  Recent Labs  Lab 10/08/23 0500  CHOL 165  TRIG 147  HDL 34*  LDLCALC 102*  CHOLHDL 4.9    Hematology Recent Labs  Lab 10/06/23 0816 10/06/23 0845 10/07/23 0144 10/08/23 0500  WBC 21.5*  --  14.6* 7.7  RBC 3.23*  --  2.82* 2.48*  HGB 9.2* 8.8* 8.0* 7.0*  HCT 30.1* 26.0* 25.3* 22.0*  MCV 93.2  --  89.7 88.7  MCH 28.5  --  28.4 28.2  MCHC 30.6  --  31.6 31.8  RDW 15.3  --  15.0 15.0  PLT 187  --  147* 148*   Thyroid  No results for input(s): TSH, FREET4 in the last 168 hours.  BNP Recent Labs  Lab 10/06/23 0416  BNP 1,289.3*    DDimer No results for input(s): DDIMER in the last 168 hours.   Radiology  ECHOCARDIOGRAM LIMITED Result Date: 10/06/2023    ECHOCARDIOGRAM LIMITED REPORT   Patient Name:   Jacqueline Hancock Date of Exam: 10/06/2023 Medical Rec #:  [id number]   Height:       59.0 in Accession #:    [id number]  Weight:       146.2 lb Date of Birth:  1940/09/19   BSA:          1.614 m Patient Age:    83 years    BP:           187/68 mmHg Patient Gender: F           HR:           87 bpm. Exam Location:  Inpatient Procedure: Limited Echo (Both Spectral and Color Flow Doppler were utilized            during procedure). Indications:    Flash pulmonary edema (HCC)  History:        Patient has  prior history of Echocardiogram examinations, most                 recent 08/22/2023. Signs/Symptoms:Dyspnea; Risk                 Factors:Hypertension.  Sonographer:    Jayson Gaskins Referring Phys: [phone] WHITNEY D HARRIS IMPRESSIONS  1. Limited Echocardiogram.  2. The left ventricle demonstrates both global and regional wall motion abnormalities (see scoring diagram/findings for description). The apex is akinetic. The entire lateral wall, anterior septum, and basal inferior segment are hypokinetic. The inferior septum and mid and distal inferior wall are normal. . Left ventricular ejection fraction, by estimation, is 25 to 30%. The left ventricle has severely decreased function. Left ventricular diastolic function could not be evaluated.  3. The mitral valve is grossly normal.  4. The aortic valve was not well visualized. Aortic valve regurgitation is not visualized. Aortic valve sclerosis is present, with no evidence of aortic valve stenosis.  5. The inferior vena cava is normal in size with greater than 50% respiratory variability, suggesting right atrial pressure of 3 mmHg. Comparison(s): A prior study was performed on 08/22/2023. LVEF has decreased (from 60-65% to 25-30%). FINDINGS  Left Ventricle: The left ventricle demonstrates both global and regional wall motion abnormalities (see scoring diagram/findings for description). The apex is akinetic. The entire lateral wall, anterior septum, and basal inferior segment are hypokinetic. The inferior septum and mid and distal inferior wall are normal. Left  ventricular ejection fraction, by estimation, is 25 to 30%. The left ventricle has severely decreased function. The left ventricular internal cavity size was normal in size. There is no left ventricular hypertrophy. Left ventricular diastolic function could not be evaluated. Left ventricular diastolic function could not be evaluated due to nondiagnostic images.  LV Wall Scoring: The apex is akinetic. The entire  lateral wall, anterior septum, and basal inferior segment are hypokinetic. The inferior septum and mid and distal inferior wall are normal. Pericardium: There is no evidence of pericardial effusion. Mitral Valve: The mitral valve is grossly normal. Tricuspid Valve: The tricuspid valve is grossly normal. Aortic Valve: The aortic valve was not well visualized. Aortic valve regurgitation is not visualized. Aortic valve sclerosis is present, with no evidence of aortic valve stenosis. Venous: The inferior vena cava is normal in size with greater than 50% respiratory variability, suggesting right atrial pressure of 3 mmHg. LEFT VENTRICLE PLAX 2D LVIDd:         4.40 cm LVIDs:         4.00 cm LV PW:         0.90 cm LV IVS:        0.90 cm LVOT diam:     1.70 cm LVOT Area:     2.27 cm  LEFT ATRIUM           Index        RIGHT ATRIUM          Index LA Vol (A4C): 33.4 ml 20.69 ml/m  RA Area:     5.84 cm                                    RA Volume:   7.51 ml  4.65 ml/m   SHUNTS Systemic Diam: 1.70 cm Sunit Tolia Electronically signed by Madonna Large Signature Date/Time: 10/06/2023/4:27:49 PM    Final     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  She was intubated due to hypoxic respiratory failure.   Assessment & Plan  NSTEMI: Presented with chest pain and SOB, plan to be in acute hypoxic respiratory failure and was intubated on 7/4.  EKG showed sinus tachycardia, no ischemia. Hs troponin elevated, 63>>343>>1186.  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  -Complicated situation. She is on chemotherapy and has had recent anemia with Hgb down to 6.2 on 6/19.  She was started on heparin  drip and aspirin .  Unfortunately she is becoming more anemic, hemoglobin has fallen from 9.2 on 7/4 to 7.0 this morning.  Would hold heparin  drip.  Would transfuse 1 unit PRBCs for goal  hemoglobin greater than 8.  With her anemia she is not a candidate for cardiac catheterization at this time  Acute hypoxic respiratory failure secondary to flash pulmonary edema: Intubated on 7/4, presented with chest pains and SOB. Echo from May 2025 showed normal LVEF, normal RV function, mild to moderate MR, trivial AI and CVP 3 mmHg.  Limited echocardiogram obtained 7/4 showed LVEF 25 to 30% with RWMA.  Imaging evidence of flash pulmonary edema/pulmonary vascular congestion. BNP elevated, 1289.  Serum creatinine increased from 1.6 in the ER to 2.07 upon arrival to the ICU.   -She was started on IV Lasix  drip at 12  mg/h in ICU.  Good diuresis, switched to IV Lasix  80 mg daily on 7/5.  She was extubated yesterday, now on room air - Still with JVD on exam but volume status improved. With giving PRBC transfusion today, would continue IV lasix   Acute systolic heart failure: LVEF 25-30%.  Could be cardiotoxicity from chemo vs ischemia. -Diuresis as above -Will add GDMT as able, limited by renal function.     AKI: Plan as above, monitor with diuresis   Large B-cell lymphoma: On chemotherapy.     For questions or updates, please contact [facility] Please consult [email or web address] for contact info under     Signed, Lonni LITTIE [name], MD  10/08/2023, 7:12 AM

## 2023-10-08 NOTE — Progress Notes (Signed)
 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  In the emergency room, she was hypotensive and tachycardic.  CT angiogram was negative for PE but consistent with bilateral pulmonary edema.  Initially started on BiPAP with improvement of work of breathing, however became more somnolent so was intubated for hypercapnia.  Patient was treated for flash pulmonary edema, found to have acute congestive heart failure.  Ultimately extubated and transferred out of ICU. 7/4, hypercapnic and somnolent.  Intubated and admitted to ICU. 7/5, extubated and was ultimately on room air.  Out of ICU. Found to have acute congestive heart failure and followed by cardiology.  Subjective: Patient seen and examined in the morning rounds.  Discussed with cardiology.  Patient remained on room air overnight.  She has not mobilized yet.  She feels okay at rest but feels extremely weak and not sure how she will do on mobility.  Hemoglobin 7.  Decided to give 1 unit of transfusion and patient is consenting.  Assessment & Plan:   Acute hypoxemic and hypercapnic respiratory failure due to volume overload. secondary to non-STEMI causing acute systolic heart failure with flash pulmonary edema: Intubated for airway protection, now extubated to room air.  Continue aggressive chest physiotherapy. Also noted to have leukocytosis and high procalcitonin, treated with antibiotics, will complete 5 days of vancomycin  cefepime  and azithromycin .  Cultures are negative so far.  Non-STEMI Acute congestive heart failure, ejection fraction 25% with plus pulmonary  edema Patient currently remains on heparin  infusion. Started on aspirin . Received aggressive diuresis, currently on IV Lasix .  Urine output more than 3 L last 24 hours.  Respiratory status improving with diuresis. Aggressive potassium replacement. Echocardiogram with ejection fraction 25%, regional wall motion abnormality. Patient with chemotherapy, anemia so cardiology is recommending against cardiac catheterization at this time. Anticipate conservative management with follow-up.  Anemia, acute on chronic.  Large B-cell lymphoma with histological transformation: Patient is on Imbruvica . Hemoglobin 7, with non-STEMI will try to keep hemoglobin more than 8.  1 unit PRBC transfusion today.  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  Close monitoring on dialysis.  Hypertension with hypertensive urgency on admission: Not on medications at home.  Monitoring blood pressure.  Currently stabilizing.     DVT prophylaxis: Place and maintain sequential compression device Start: 10/08/23 0753   Code Status: Full code Family Communication: None at the bedside Disposition Plan: Status is: Inpatient Remains inpatient appropriate because: Severe systemic illness     Consultants:  Cardiology Critical care  Procedures:  None  Antimicrobials:  Vancomycin , cefepime  and azithromycin  7/4---     Objective: Vitals:   10/08/23 0317 10/08/23 0456 10/08/23 0642 10/08/23 0700  BP:    (!) 152/80  Pulse: 94  98 100  Resp:    16  Temp: 100.3 F (37.9 C) 98.7 F (37.1 C)  99.2 F (37.3 C)  TempSrc:  Oral  Axillary  SpO2: 92%  94% 93%  Weight:   60.1 kg   Height:        Intake/Output Summary (Last 24 hours) at 10/08/2023 1125 Last data filed at 10/08/2023 0922 Gross per 24 hour  Intake 298.03 ml  Output 2390 ml  Net -2091.97 ml   Filed Weights   10/06/23 1048 10/07/23 1707 10/08/23 0642  Weight: 66.3 kg 60.9 kg 60.1 kg    Examination:  General exam: Appears calm and comfortable at  rest. Frail and debilitated.  Chronically sick looking. She was on room air.  Able to talk in complete sentences. Respiratory system: Some conducted upper airway sounds.. Cardiovascular system: S1 & S2 heard, RRR.  Trace bilateral pedal edema.  JVD present. Right IJ Port-A-Cath present. Gastrointestinal system: Soft.  Nontender.  Bowel sound present. Central nervous system: Alert and oriented. No focal neurological deficits.  Data Reviewed: I have personally reviewed following labs and imaging studies  CBC: Recent Labs  Lab 10/04/23 1100 10/04/23 1100 10/06/23 0416 10/06/23 0605 10/06/23 0816 10/06/23 0845 10/07/23 0144 10/08/23 0500  WBC 13.7*  --  26.0*  --  21.5*  --  14.6* 7.7  NEUTROABS 10.1*  --   --   --   --   --   --   --   HGB 9.0*   < > 9.0* 8.8* 9.2* 8.8* 8.0* 7.0*  HCT 28.3*  --  28.3* 26.0* 30.1* 26.0* 25.3* 22.0*  MCV 88.7  --  90.4  --  93.2  --  89.7 88.7  PLT 147*  --  235  --  187  --  147* 148*   < > = values in this interval not displayed.   Basic Metabolic Panel: Recent Labs  Lab 10/04/23 1100 10/06/23 0416 10/06/23 0605 10/06/23 0816 10/06/23 0845 10/06/23 1612 10/07/23 0144 10/08/23 0500  NA 141 139 138  --  140 140 139 138  K 3.6 3.7 3.6  --  3.1* 4.0 4.0 3.2*  CL 103 105  --   --   --  107 104 99  CO2 29 20*  --   --   --  20* 20* 25  GLUCOSE 103* 124*  --   --   --  118* 99 101*  BUN 24* 29*  --   --   --  31* 33* 35*  CREATININE 1.61* 1.63*  --  1.88*  --  2.07* 2.03* 2.07*  CALCIUM 8.5* 8.4*  --   --   --  7.9* 8.2* 8.4*  MG  --   --   --   --   --   --  1.5* 2.2  PHOS  --   --   --   --   --   --  5.0*  --    GFR: Estimated Creatinine Clearance: 16.3 mL/min (A) (by C-G formula based on SCr of 2.07 mg/dL (H)). Liver Function Tests: Recent Labs  Lab 10/04/23 1100 10/06/23 0416  AST 16 39  ALT 11 23  ALKPHOS 109 108  BILITOT 0.7 1.3*  PROT 5.8* 5.7*  ALBUMIN  3.4* 2.9*   No results for input(s): LIPASE, AMYLASE in the  last 168 hours. No results for input(s): AMMONIA in the last 168 hours. Coagulation Profile: Recent Labs  Lab 10/06/23 0416  INR 1.2   Cardiac Enzymes: No results for input(s): CKTOTAL, CKMB, CKMBINDEX, TROPONINI in the last 168 hours. BNP (last 3 results) Recent Labs    02/21/23 1206  PROBNP 14.0   HbA1C: No results for input(s): HGBA1C in the last 72 hours. CBG: Recent Labs  Lab 10/06/23 2326 10/07/23 0339 10/07/23 0753 10/07/23 1121 10/07/23 1554  GLUCAP 102* 102* 108* 105* 95   Lipid Profile: Recent Labs  10/08/23 0500  CHOL 165  HDL 34*  LDLCALC 102*  TRIG 147  CHOLHDL 4.9   Thyroid  Function Tests: No results for input(s): TSH, T4TOTAL, FREET4, T3FREE, THYROIDAB in the last 72 hours. Anemia Panel: No results for input(s): VITAMINB12, FOLATE, FERRITIN, TIBC, IRON, RETICCTPCT in the last 72 hours. Sepsis Labs: Recent Labs  Lab 10/06/23 0542 10/06/23 0816 10/06/23 0847 10/06/23 0950 10/06/23 1059  PROCALCITON  --  7.35  --   --   --   LATICACIDVEN 2.7*  --  2.0* 2.3* 2.0*    Recent Results (from the past 240 hours)  Culture, Urine     Status: None   Collection Time: 10/04/23  6:12 PM   Specimen: Urine, Clean Catch  Result Value Ref Range Status   Specimen Description   Final    URINE, CLEAN CATCH Performed at [facility] [address], [place], [place] [address]    Special Requests   Final    NONE Performed at [facility], [address], [place], [place] [address]    Culture   Final    NO GROWTH Performed at [facility], [address] [place], [place] [address]    Report Status 10/05/2023 FINAL  Final  Culture, blood (Routine X 2) w Reflex to ID Panel     Status: None (Preliminary result)   Collection Time: 10/06/23  5:35 AM   Specimen: BLOOD  Result Value Ref Range Status   Specimen Description BLOOD RIGHT ANTECUBITAL  Final   Special Requests    Final    BOTTLES DRAWN AEROBIC AND ANAEROBIC Blood Culture results may not be optimal due to an inadequate volume of blood received in culture bottles   Culture   Final    NO GROWTH 2 DAYS Performed at [facility], [address] [place], [place] [address]    Report Status PENDING  Incomplete  Resp panel by RT-PCR (RSV, Flu A&B, Covid) Anterior Nasal Swab     Status: None   Collection Time: 10/06/23  5:50 AM   Specimen: Anterior Nasal Swab  Result Value Ref Range Status   SARS Coronavirus 2 by RT PCR NEGATIVE NEGATIVE Final   Influenza A by PCR NEGATIVE NEGATIVE Final   Influenza B by PCR NEGATIVE NEGATIVE Final    Comment: (NOTE) The Xpert Xpress SARS-CoV-2/FLU/RSV plus assay is intended as an aid in the diagnosis of influenza from Nasopharyngeal swab specimens and should not be used as a sole basis for treatment. Nasal washings and aspirates are unacceptable for Xpert Xpress SARS-CoV-2/FLU/RSV testing.  Fact Sheet for Patients: BloggerCourse.com  Fact Sheet for Healthcare Providers: SeriousBroker.it  This test is not yet approved or cleared by the [place]  FDA and has been authorized for detection and/or diagnosis of SARS-CoV-2 by FDA under an Emergency Use Authorization (EUA). This EUA will remain in effect (meaning this test can be used) for the duration of the COVID-19 declaration under Section 564(b)(1) of the Act, 21 U.S.C. section 360bbb-3(b)(1), unless the authorization is terminated or revoked.     Resp Syncytial Virus by PCR NEGATIVE NEGATIVE Final    Comment: (NOTE) Fact Sheet for Patients: BloggerCourse.com  Fact Sheet for Healthcare Providers: SeriousBroker.it  This test is not yet approved or cleared by the [place]  FDA and has been authorized for detection and/or diagnosis of SARS-CoV-2 by FDA under an Emergency Use Authorization (EUA). This  EUA will remain in effect (meaning this test can be  used) for the duration of the COVID-19 declaration under Section 564(b)(1) of the Act, 21 U.S.C. section 360bbb-3(b)(1), unless the authorization is terminated or revoked.  Performed at [facility], [address] [place], [place] [address]   Culture, blood (Routine X 2) w Reflex to ID Panel     Status: None (Preliminary result)   Collection Time: 10/06/23  6:15 AM   Specimen: BLOOD RIGHT HAND  Result Value Ref Range Status   Specimen Description BLOOD RIGHT HAND  Final   Special Requests   Final    BOTTLES DRAWN AEROBIC AND ANAEROBIC Blood Culture adequate volume   Culture   Final    NO GROWTH 2 DAYS Performed at [facility], [address], [place], [place] [address]    Report Status PENDING  Incomplete  MRSA Next Gen by PCR, Nasal     Status: Abnormal   Collection Time: 10/06/23  8:08 AM   Specimen: Urine, Catheterized; Nasal Swab  Result Value Ref Range Status   MRSA by PCR Next Gen DETECTED (A) NOT DETECTED Final    Comment: RESULT CALLED TO, READ BACK BY AND VERIFIED WITH: RN ZAHRA [id number] AT 1101 BY EC (NOTE) The GeneXpert MRSA Assay (FDA approved for NASAL specimens only), is one component of a comprehensive MRSA colonization surveillance program. It is not intended to diagnose MRSA infection nor to guide or monitor treatment for MRSA infections. Test performance is not FDA approved in patients less than 59 years old. Performed at [facility], [address], [place], [place] [address]          Radiology Studies: ECHOCARDIOGRAM LIMITED Result Date: 10/06/2023    ECHOCARDIOGRAM LIMITED REPORT   Patient Name:   TAKEYLA MILLION Date of Exam: 10/06/2023 Medical Rec #:  [id number]   Height:       59.0 in Accession #:    [id number]  Weight:       146.2 lb Date of Birth:  [birth year]   BSA:          1.614 m Patient Age:    83 years    BP:           187/68 mmHg Patient Gender: F           HR:           87  bpm. Exam Location:  Inpatient Procedure: Limited Echo (Both Spectral and Color Flow Doppler were utilized            during procedure). Indications:    Flash pulmonary edema (HCC)  History:        Patient has prior history of Echocardiogram examinations, most                 recent 08/22/2023. Signs/Symptoms:Dyspnea; Risk                 Factors:Hypertension.  Sonographer:    Jayson Gaskins Referring Phys: [id number] WHITNEY D HARRIS IMPRESSIONS  1. Limited Echocardiogram.  2. The left ventricle demonstrates both global and regional wall motion abnormalities (see scoring diagram/findings for description). The apex is akinetic. The entire lateral wall, anterior septum, and basal inferior segment are hypokinetic. The inferior septum and mid and distal inferior wall are normal. . Left ventricular ejection fraction, by estimation, is 25 to 30%. The left ventricle has severely decreased function. Left ventricular diastolic function could not be evaluated.  3. The mitral valve is grossly normal.  4. The aortic valve was  not well visualized. Aortic valve regurgitation is not visualized. Aortic valve sclerosis is present, with no evidence of aortic valve stenosis.  5. The inferior vena cava is normal in size with greater than 50% respiratory variability, suggesting right atrial pressure of 3 mmHg. Comparison(s): A prior study was performed on 08/22/2023. LVEF has decreased (from 60-65% to 25-30%). FINDINGS  Left Ventricle: The left ventricle demonstrates both global and regional wall motion abnormalities (see scoring diagram/findings for description). The apex is akinetic. The entire lateral wall, anterior septum, and basal inferior segment are hypokinetic. The inferior septum and mid and distal inferior wall are normal. Left ventricular ejection fraction, by estimation, is 25 to 30%. The left ventricle has severely decreased function. The left ventricular internal cavity size was normal in size. There is no left ventricular  hypertrophy. Left ventricular diastolic function could not be evaluated. Left ventricular diastolic function could not be evaluated due to nondiagnostic images.  LV Wall Scoring: The apex is akinetic. The entire lateral wall, anterior septum, and basal inferior segment are hypokinetic. The inferior septum and mid and distal inferior wall are normal. Pericardium: There is no evidence of pericardial effusion. Mitral Valve: The mitral valve is grossly normal. Tricuspid Valve: The tricuspid valve is grossly normal. Aortic Valve: The aortic valve was not well visualized. Aortic valve regurgitation is not visualized. Aortic valve sclerosis is present, with no evidence of aortic valve stenosis. Venous: The inferior vena cava is normal in size with greater than 50% respiratory variability, suggesting right atrial pressure of 3 mmHg. LEFT VENTRICLE PLAX 2D LVIDd:         4.40 cm LVIDs:         4.00 cm LV PW:         0.90 cm LV IVS:        0.90 cm LVOT diam:     1.70 cm LVOT Area:     2.27 cm  LEFT ATRIUM           Index        RIGHT ATRIUM          Index LA Vol (A4C): 33.4 ml 20.69 ml/m  RA Area:     5.84 cm                                    RA Volume:   7.51 ml  4.65 ml/m   SHUNTS Systemic Diam: 1.70 cm Sunit Tolia Electronically signed by Madonna Large Signature Date/Time: 10/06/2023/4:27:49 PM    Final         Scheduled Meds:  sodium chloride    Intravenous Once   aspirin   81 mg Oral Daily   Chlorhexidine  Gluconate Cloth  6 each Topical Daily   furosemide   80 mg Intravenous Daily   mupirocin  ointment  1 Application Nasal BID   potassium chloride   40 mEq Oral BID   sodium chloride  flush  10-40 mL Intracatheter Q12H   Continuous Infusions:  azithromycin  500 mg (10/08/23 0904)   ceFEPime  (MAXIPIME ) IV 2 g (10/08/23 0457)   vancomycin  1,000 mg (10/08/23 0902)     LOS: 2 days    Time spent: 55 minutes    Renato Applebaum, MD [facility]

## 2023-10-08 NOTE — Plan of Care (Signed)
  Problem: Education: Goal: Knowledge of General Education information will improve Description: Including pain rating scale, medication(s)/side effects and non-pharmacologic comfort measures Outcome: Progressing   Problem: Health Behavior/Discharge Planning: Goal: Ability to manage health-related needs will improve Outcome: Progressing   Problem: Clinical Measurements: Goal: Ability to maintain clinical measurements within normal limits will improve Outcome: Progressing Goal: Will remain free from infection Outcome: Progressing Goal: Diagnostic test results will improve Outcome: Progressing Goal: Respiratory complications will improve Outcome: Progressing   Problem: Activity: Goal: Risk for activity intolerance will decrease Outcome: Progressing   Problem: Nutrition: Goal: Adequate nutrition will be maintained Outcome: Progressing   Problem: Coping: Goal: Level of anxiety will decrease Outcome: Progressing   Problem: Elimination: Goal: Will not experience complications related to bowel motility Outcome: Progressing Goal: Will not experience complications related to urinary retention Outcome: Progressing   

## 2023-10-09 ENCOUNTER — Other Ambulatory Visit (HOSPITAL_COMMUNITY): Payer: Self-pay

## 2023-10-09 DIAGNOSIS — J9602 Acute respiratory failure with hypercapnia: Secondary | ICD-10-CM | POA: Diagnosis not present

## 2023-10-09 LAB — BASIC METABOLIC PANEL WITH GFR
Anion gap: 14 (ref 5–15)
BUN: 36 mg/dL — ABNORMAL HIGH (ref 8–23)
CO2: 25 mmol/L (ref 22–32)
Calcium: 8.8 mg/dL — ABNORMAL LOW (ref 8.9–10.3)
Chloride: 102 mmol/L (ref 98–111)
Creatinine, Ser: 1.98 mg/dL — ABNORMAL HIGH (ref 0.44–1.00)
GFR, Estimated: 25 mL/min — ABNORMAL LOW (ref >60)
Glucose, Bld: 101 mg/dL — ABNORMAL HIGH (ref 70–99)
Potassium: 4.4 mmol/L (ref 3.5–5.1)
Sodium: 141 mmol/L (ref 135–145)

## 2023-10-09 LAB — CBC
HCT: 27.6 % — ABNORMAL LOW (ref 36.0–46.0)
Hemoglobin: 8.7 g/dL — ABNORMAL LOW (ref 12.0–15.0)
MCH: 28.3 pg (ref 26.0–34.0)
MCHC: 31.5 g/dL (ref 30.0–36.0)
MCV: 89.9 fL (ref 80.0–100.0)
Platelets: 157 K/uL (ref 150–400)
RBC: 3.07 MIL/uL — ABNORMAL LOW (ref 3.87–5.11)
RDW: 15.4 % (ref 11.5–15.5)
WBC: 6.6 K/uL (ref 4.0–10.5)
nRBC: 0 % (ref 0.0–0.2)

## 2023-10-09 NOTE — TOC Initial Note (Addendum)
 Transition of Care [facility]) - Initial/Assessment Note    Patient Details  Name: Jacqueline Hancock MRN: [id number] Date of Birth: 04/16/1940  Transition of Care [facility]) CM/SW Contact:    Waddell Barnie Rama, RN Phone Number: 10/09/2023, 4:52 PM  Clinical Narrative:                 From home with son, has PCP and insurance on file, states has [facility] services in place with [facility] , NCM confirmed with Jon at [place], and she has all the DME she needs at  home.  States family member will transport them home at [employer] and family is support system, states gets medications from [facility] on [address].  Pta self ambulatory .  Expected Discharge Plan: Home w Home Health Services Barriers to Discharge: Continued Medical Work up   Patient Goals and CMS Choice Patient states their goals for this hospitalization and ongoing recovery are:: return home, son is there with her all the time CMS Medicare.gov Compare Post Acute Care list provided to:: Patient Choice offered to / list presented to : Patient      Expected Discharge Plan and Services   Discharge Planning Services: CM Consult Post Acute Care Choice: Home Health Living arrangements for the past 2 months: Single Family Home                   DME Agency: NA       HH Arranged: PT, OT, Nurse's Aide HH Agency:  [employer], [employer]film[employer]video) Date HH Agency Contacted: 10/09/23 Time HH Agency Contacted: 1651 Representative spoke with at [facility] Agency: Jon  Prior Living Arrangements/Services Living arrangements for the past 2 months: Single Family Home Lives with:: Adult Children Patient language and need for interpreter reviewed:: Yes Do you feel safe going back to the place where you live?: Yes      Need for Family Participation in Patient Care: Yes (Comment) Care giver support system in place?: Yes (comment) Current home services: DME (has all DME she needs. walker, bsc, etc) Criminal Activity/Legal Involvement Pertinent to Current Situation/Hospitalization: No -  Comment as needed  Activities of Daily Living   ADL Screening (condition at time of admission) Independently performs ADLs?: Yes (appropriate for developmental age) Is the patient deaf or have difficulty hearing?: No Does the patient have difficulty seeing, even when wearing glasses/contacts?: No Does the patient have difficulty concentrating, remembering, or making decisions?: No  Permission Sought/Granted Permission sought to share information with : Case Manager, Family Supports Permission granted to share information with : Yes, Verbal Permission Granted     Permission granted to share info w AGENCY: HH        Emotional Assessment Appearance:: Appears stated age Attitude/Demeanor/Rapport: Engaged Affect (typically observed): Appropriate Orientation: : Oriented to Self, Oriented to Place, Oriented to  Time, Oriented to Situation Alcohol / Substance Use: Not Applicable Psych Involvement: No (comment)  Admission diagnosis:  Acute respiratory failure with hypoxia and hypercapnia (HCC) [J96.01, J96.02] Hypercapnic respiratory failure (HCC) [J96.92] Patient Active Problem List   Diagnosis Date Noted   Anemia 10/08/2023   Non-ST elevation (NSTEMI) myocardial infarction (HCC) 10/07/2023   Acute respiratory failure with hypoxia and hypercapnia (HCC) 10/07/2023   Acute systolic heart failure (HCC) 10/07/2023   Hypercapnic respiratory failure (HCC) 10/06/2023   Port-A-Cath in place 09/12/2023   Generalized weakness 09/02/2023   Stricture and stenosis of esophagus 08/31/2023   Multiple duodenal ulcers 08/31/2023   Dysphagia 08/30/2023   Malnutrition of moderate degree 08/29/2023   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   Leucocytosis 08/28/2023   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   Perinephric abdominal mass 08/03/2023   Hyperkalemia 08/03/2023   AKI (acute kidney injury) (HCC) 08/01/2023   Hypercalcemia 06/15/2023   Traumatic  perinephric hematoma of right side, initial encounter 06/15/2023   ILD (interstitial lung disease) (HCC) 06/15/2023   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   Weakness 06/15/2023   Kidney laceration, right 06/15/2023   Atherosclerosis of aorta (HCC) 02/07/2023   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   Obesity (BMI 30.0-34.9) 08/06/2020   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   Chronic kidney disease, stage 3b (HCC) 09/10/2015   Vitamin D  deficiency 06/20/2013   Medication management 06/20/2013   Hypertension    Hyperlipidemia    Abnormal glucose    GERD (gastroesophageal reflux disease)    Osteopenia    PCP:  Leonarda Roxan BROCKS, NP Pharmacy:   [facility] [phone] - [place], [place] - [address] AT [facility] [address] [place] [address] Phone: [phone] Fax: [phone]  [facility] [address] [place] [address] Phone: [phone] Fax: [phone]     Social Drivers of Health (SDOH) Social History: SDOH Screenings   Food Insecurity: No Food Insecurity (10/06/2023)  Housing: Low Risk  (10/06/2023)  Transportation Needs: No Transportation Needs (10/06/2023)  Utilities: Not At Risk (10/06/2023)  Recent Concern: Utilities - At Risk (08/02/2023)  Depression (PHQ2-9): Low Risk  (10/04/2023)  Social Connections: Moderately Integrated (10/06/2023)  Tobacco Use: Low Risk  (10/07/2023)  Health Literacy: Adequate Health Literacy (02/16/2023)   SDOH Interventions:     Readmission Risk Interventions    10/09/2023    4:49 PM 08/30/2023   11:03 AM 08/02/2023   11:12 AM  Readmission Risk Prevention Plan  Transportation Screening Complete Complete Complete  PCP or Specialist Appt within 5-7 Days   Complete  Home Care Screening   Complete  Medication Review (RN CM)   Complete  Medication Review Oceanographer) Complete Complete    PCP or Specialist appointment within 3-5 days of discharge  Complete   HRI or Home Care Consult Complete Complete   SW Recovery Care/Counseling Consult  Complete   Palliative Care Screening Not Applicable Complete   Skilled Nursing Facility Not Applicable Complete

## 2023-10-09 NOTE — Progress Notes (Signed)
 Rounding Note   Patient Name: Jacqueline Hancock Date of Encounter: 10/09/2023  [facility] Cardiologist: None Kate  Subjective Extubated 7/5, now on room air.  She was net -2.95 L yesterday, -5.3 L for admission.  AM labs stable, cr slightly improved, hemoglobin 8.7 after 1 U PRBC  Scheduled Meds:  sodium chloride    Intravenous Once   aspirin   81 mg Oral Daily   Chlorhexidine  Gluconate Cloth  6 each Topical Daily   furosemide   80 mg Intravenous Daily   mupirocin  ointment  1 Application Nasal BID   sodium chloride  flush  10-40 mL Intracatheter Q12H   Continuous Infusions:  azithromycin  Stopped (10/08/23 1009)   ceFEPime  (MAXIPIME ) IV 2 g (10/09/23 0527)   vancomycin  Stopped (10/08/23 1002)   PRN Meds: acetaminophen , albuterol , docusate, guaiFENesin -dextromethorphan , polyethylene glycol, sodium chloride  flush   Vital Signs  Vitals:   10/08/23 2000 10/08/23 2300 10/09/23 0300 10/09/23 0420  BP: (!) 149/79 (!) 146/84 (!) 148/89   Pulse: (!) 101 100 (!) 103   Resp: 20 20 19    Temp: 98.6 F (37 C) 98.5 F (36.9 C) 98.2 F (36.8 C)   TempSrc: Oral Oral Oral   SpO2: 94% 93% 95%   Weight:    58.9 kg  Height:        Intake/Output Summary (Last 24 hours) at 10/09/2023 0615 Last data filed at 10/09/2023 0300 Gross per 24 hour  Intake 1189.69 ml  Output 2050 ml  Net -860.31 ml      10/09/2023    4:20 AM 10/08/2023    6:42 AM 10/07/2023    5:07 PM  Last 3 Weights  Weight (lbs) 129 lb 13.6 oz 132 lb 7.9 oz 134 lb 4.2 oz  Weight (kg) 58.9 kg 60.1 kg 60.9 kg      Telemetry NSR, ST - Personally Reviewed  ECG  Sinus tach 7/4- Personally Reviewed  Physical Exam GEN: alert Neck: No JVD Cardiac: RRR, no murmurs Respiratory: grossly clear  GI: Soft, nontender MS: No edema, warm Neuro:  nonfocal Psych: normal mood and affect  Labs High Sensitivity Troponin:   Recent Labs  Lab 10/06/23 0416 10/06/23 0615 10/06/23 0816 10/06/23 1059  TROPONINIHS 63* 343*  1,163* 1,186*     Chemistry Recent Labs  Lab 10/04/23 1100 10/06/23 0416 10/06/23 0605 10/06/23 1612 10/07/23 0144 10/08/23 0500 10/08/23 2113  NA 141 139   < > 140 139 138  --   K 3.6 3.7   < > 4.0 4.0 3.2*  --   CL 103 105  --  107 104 99  --   CO2 29 20*  --  20* 20* 25  --   GLUCOSE 103* 124*  --  118* 99 101*  --   BUN 24* 29*  --  31* 33* 35*  --   CREATININE 1.61* 1.63*   < > 2.07* 2.03* 2.07*  --   CALCIUM 8.5* 8.4*  --  7.9* 8.2* 8.4*  --   MG  --   --   --   --  1.5* 2.2 2.0  PROT 5.8* 5.7*  --   --   --   --   --   ALBUMIN  3.4* 2.9*  --   --   --   --   --   AST 16 39  --   --   --   --   --   ALT 11 23  --   --   --   --   --  ALKPHOS 109 108  --   --   --   --   --   BILITOT 0.7 1.3*  --   --   --   --   --   GFRNONAA 32* 31*   < > 23* 24* 23*  --   ANIONGAP 9 14  --  13 15 14   --    < > = values in this interval not displayed.    Lipids  Recent Labs  Lab 10/08/23 0500  CHOL 165  TRIG 147  HDL 34*  LDLCALC 102*  CHOLHDL 4.9    Hematology Recent Labs  Lab 10/06/23 0816 10/06/23 0845 10/07/23 0144 10/08/23 0500  WBC 21.5*  --  14.6* 7.7  RBC 3.23*  --  2.82* 2.48*  HGB 9.2* 8.8* 8.0* 7.0*  HCT 30.1* 26.0* 25.3* 22.0*  MCV 93.2  --  89.7 88.7  MCH 28.5  --  28.4 28.2  MCHC 30.6  --  31.6 31.8  RDW 15.3  --  15.0 15.0  PLT 187  --  147* 148*   Thyroid  No results for input(s): TSH, FREET4 in the last 168 hours.  BNP Recent Labs  Lab 10/06/23 0416  BNP 1,289.3*    DDimer No results for input(s): DDIMER in the last 168 hours.   Radiology  No results found.   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  She was intubated due to hypoxic respiratory failure.   Assessment & Plan  NSTEMI: Presented with chest pain and SOB, plan to be in acute hypoxic respiratory failure and was intubated on 7/4.  EKG showed sinus  tachycardia, no ischemia. Hs troponin elevated, 63>>343>>1186.  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  -Complicated situation. She is on chemotherapy and has had recent anemia with Hgb down to 6.2 on 6/19.  She was started on heparin  drip and aspirin  but became progressively anemic, heparin  gtt stopped.  Received 1 unit PRBCs for goal hemoglobin greater than 8.  With her anemia she is not a candidate for cardiac catheterization at this time. - no cp or sob today. Tolerating diuresis.   Acute hypoxic respiratory failure secondary to flash pulmonary edema: Intubated on 7/4, presented with chest pains and SOB. Echo from May 2025 showed normal LVEF, normal RV function, mild to moderate MR, trivial AI and CVP 3 mmHg.  Limited echocardiogram obtained 7/4 showed LVEF 25 to 30% with RWMA.  Imaging evidence of flash pulmonary edema/pulmonary vascular congestion. BNP elevated, 1289.  Serum creatinine increased from 1.6 in the ER to 2.07 upon arrival to the ICU.   -She was started on IV Lasix  drip at 12 mg/h in ICU.  Good diuresis, switched to IV Lasix  80 mg daily on 7/5.  She was extubated 7/5, now on room air - Diuresed well yesterday after receiving blood. She has received her IV lasix  80 mg daily today already, we will monitor I/O with goal to switch to oral lasix  tomorrow, dosing tbd.  Acute systolic heart failure: LVEF 25-30%.  Could be cardiotoxicity from chemo vs ischemia. -Diuresis as above -Will add GDMT as able, limited by renal function.     AKI: Plan as above, monitor with diuresis, slight improvement.    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   D/w primary team, and patient's son and family.  For questions or updates, please contact [facility] Please consult [email or web address] for contact info under     Signed, [name], MD  10/09/2023, 6:15 AM

## 2023-10-09 NOTE — Progress Notes (Signed)
 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  In the emergency room, she was hypotensive and tachycardic.  CT angiogram was negative for PE but consistent with bilateral pulmonary edema.  Initially started on BiPAP with improvement of work of breathing, however became more somnolent so was intubated for hypercapnia.  Patient was treated for flash pulmonary edema, found to have acute congestive heart failure.  Ultimately extubated and transferred out of ICU. 7/4, hypercapnic and somnolent.  Intubated and admitted to ICU. 7/5, extubated and was ultimately on room air.  Out of ICU. Found to have acute congestive heart failure and followed by cardiology.  Subjective: Patient seen and examined.  No overnight events.  She feels much better today.  Slept well last night.  Walking to the bathroom but she has not walked to the hallway yet.  Denies any chest pain or palpitations.  Assessment & Plan:   Acute hypoxemic and hypercapnic respiratory failure due to volume overload. secondary to non-STEMI causing acute systolic heart failure with flash pulmonary edema: Intubated for airway protection, now extubated to room air.  Continue aggressive chest physiotherapy. Also noted to have leukocytosis and high procalcitonin, treated with antibiotics, will complete 5 days of vancomycin , cefepime  and azithromycin .  Cultures are negative so far.  Non-STEMI Acute congestive heart failure, ejection fraction 25% with plus pulmonary edema Patient currently remains on heparin  infusion. Started on aspirin .  Treated with IV Lasix  infusion and  then intermittent IV Lasix .  Clinically improving. Replace potassium. Echocardiogram with ejection fraction 25%, regional wall motion abnormality. Patient with chemotherapy, anemia so cardiology is recommending against cardiac catheterization at this time. Anticipate conservative management with follow-up.  Anemia, acute on chronic.  Large B-cell lymphoma with histological transformation: Patient is on Imbruvica . Monitor PRBC transfusion for hemoglobin of 7.  Appropriately responded.  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  Close monitoring on dialysis.  Hypertension with hypertensive urgency on admission: Not on medications at home.  Monitoring blood pressure.  Currently stabilizing.     DVT prophylaxis: Place and maintain sequential compression device Start: 10/08/23 0753   Code Status: Full code Family Communication: None at the bedside Disposition Plan: Status is: Inpatient Remains inpatient appropriate because: Severe systemic illness     Consultants:  Cardiology Critical care  Procedures:  None  Antimicrobials:  Vancomycin , cefepime  and azithromycin  7/4---     Objective: Vitals:   10/09/23 0300 10/09/23 0420 10/09/23 0700 10/09/23 1037  BP: (!) 148/89  (!) 147/81 125/78  Pulse: (!) 103  96 (!) 106  Resp: 19  14 (!) 27  Temp: 98.2 F (36.8 C)  98.2 F (36.8 C) 98.2 F (36.8 C)  TempSrc: Oral  Oral Oral  SpO2: 95%  92% 94%  Weight:  58.9 kg    Height:        Intake/Output Summary (Last 24 hours) at 10/09/2023 1059 Last data filed at 10/09/2023 0900 Gross per 24 hour  Intake 1179.69 ml  Output 1800 ml  Net -620.31 ml   Filed Weights   10/07/23 1707 10/08/23 0642 10/09/23 0420  Weight: 60.9 kg 60.1 kg 58.9 kg    Examination:  General  exam: Appears calm and comfortable at rest.  Pleasant and interactive. Respiratory system: Some conducted upper airway sounds.. Cardiovascular system: S1 & S2 heard, RRR.  Trace bilateral pedal edema.  JVD present. Right  IJ Port-A-Cath present. Gastrointestinal system: Soft.  Nontender.  Bowel sound present. Central nervous system: Alert and oriented. No focal neurological deficits.  Data Reviewed: I have personally reviewed following labs and imaging studies  CBC: Recent Labs  Lab 10/04/23 1100 10/06/23 0416 10/06/23 0605 10/06/23 0816 10/06/23 0845 10/07/23 0144 10/08/23 0500 10/09/23 0500  WBC 13.7* 26.0*  --  21.5*  --  14.6* 7.7 6.6  NEUTROABS 10.1*  --   --   --   --   --   --   --   HGB 9.0* 9.0*   < > 9.2* 8.8* 8.0* 7.0* 8.7*  HCT 28.3* 28.3*   < > 30.1* 26.0* 25.3* 22.0* 27.6*  MCV 88.7 90.4  --  93.2  --  89.7 88.7 89.9  PLT 147* 235  --  187  --  147* 148* 157   < > = values in this interval not displayed.   Basic Metabolic Panel: Recent Labs  Lab 10/06/23 0416 10/06/23 0605 10/06/23 0816 10/06/23 0845 10/06/23 1612 10/07/23 0144 10/08/23 0500 10/08/23 2113 10/09/23 0500  NA 139   < >  --  140 140 139 138  --  141  K 3.7   < >  --  3.1* 4.0 4.0 3.2*  --  4.4  CL 105  --   --   --  107 104 99  --  102  CO2 20*  --   --   --  20* 20* 25  --  25  GLUCOSE 124*  --   --   --  118* 99 101*  --  101*  BUN 29*  --   --   --  31* 33* 35*  --  36*  CREATININE 1.63*  --  1.88*  --  2.07* 2.03* 2.07*  --  1.98*  CALCIUM 8.4*  --   --   --  7.9* 8.2* 8.4*  --  8.8*  MG  --   --   --   --   --  1.5* 2.2 2.0  --   PHOS  --   --   --   --   --  5.0*  --   --   --    < > = values in this interval not displayed.   GFR: Estimated Creatinine Clearance: 16.8 mL/min (A) (by C-G formula based on SCr of 1.98 mg/dL (H)). Liver Function Tests: Recent Labs  Lab 10/04/23 1100 10/06/23 0416  AST 16 39  ALT 11 23  ALKPHOS 109 108  BILITOT 0.7 1.3*  PROT 5.8* 5.7*  ALBUMIN  3.4* 2.9*   No results for input(s): LIPASE, AMYLASE in the last 168 hours. No results for input(s): AMMONIA in the last 168 hours. Coagulation Profile: Recent Labs  Lab 10/06/23 0416  INR 1.2   Cardiac  Enzymes: No results for input(s): CKTOTAL, CKMB, CKMBINDEX, TROPONINI in the last 168 hours. BNP (last 3 results) Recent Labs    02/21/23 1206  PROBNP 14.0   HbA1C: No results for input(s): HGBA1C in the last 72 hours. CBG: Recent Labs  Lab 10/06/23 2326 10/07/23 0339 10/07/23 0753 10/07/23 1121 10/07/23 1554  GLUCAP 102* 102* 108* 105* 95   Lipid Profile: Recent Labs    10/08/23 0500  CHOL 165  HDL 34*  LDLCALC 102*  TRIG 147  CHOLHDL 4.9   Thyroid  Function Tests: No results for input(s): TSH, T4TOTAL, FREET4, T3FREE, THYROIDAB in the last 72 hours. Anemia Panel: No results for input(s): VITAMINB12, FOLATE, FERRITIN, TIBC, IRON, RETICCTPCT in the last 72 hours. Sepsis Labs: Recent Labs  Lab 10/06/23 0542 10/06/23 0816 10/06/23 0847 10/06/23 0950 10/06/23 1059  PROCALCITON  --  7.35  --   --   --   LATICACIDVEN 2.7*  --  2.0* 2.3* 2.0*    Recent Results (from the past 240 hours)  Culture, Urine     Status: None   Collection Time: 10/04/23  6:12 PM   Specimen: Urine, Clean Catch  Result Value Ref Range Status   Specimen Description   Final    URINE, CLEAN CATCH Performed at [facility], [address], [place], [place] [address]    Special Requests   Final    NONE Performed at [facility], [address], [place], [place] [address]    Culture   Final    NO GROWTH Performed at [facility], [address], [place], [place] [address]    Report Status 10/05/2023 FINAL  Final  Culture, blood (Routine X 2) w Reflex to ID Panel     Status: None (Preliminary result)   Collection Time: 10/06/23  5:35 AM   Specimen: BLOOD  Result Value Ref Range Status   Specimen Description BLOOD RIGHT ANTECUBITAL  Final   Special Requests   Final    BOTTLES DRAWN AEROBIC AND ANAEROBIC Blood Culture results may not be optimal due to an inadequate volume of blood received in culture bottles    Culture   Final    NO GROWTH 3 DAYS Performed at [facility], [address], [place], [place] [address]    Report Status PENDING  Incomplete  Resp panel by RT-PCR (RSV, Flu A&B, Covid) Anterior Nasal Swab     Status: None   Collection Time: 10/06/23  5:50 AM   Specimen: Anterior Nasal Swab  Result Value Ref Range Status   SARS Coronavirus 2 by RT PCR NEGATIVE NEGATIVE Final   Influenza A by PCR NEGATIVE NEGATIVE Final   Influenza B by PCR NEGATIVE NEGATIVE Final    Comment: (NOTE) The Xpert Xpress SARS-CoV-2/FLU/RSV plus assay is intended as an aid in the diagnosis of influenza from Nasopharyngeal swab specimens and should not be used as a sole basis for treatment. Nasal washings and aspirates are unacceptable for Xpert Xpress SARS-CoV-2/FLU/RSV testing.  Fact Sheet for Patients: BloggerCourse.com  Fact Sheet for Healthcare Providers: SeriousBroker.it  This test is not yet approved or cleared by the [place]  FDA and has been authorized for detection and/or diagnosis of SARS-CoV-2 by FDA under an Emergency Use Authorization (EUA). This EUA will remain in effect (meaning this test can be used) for the duration of the COVID-19 declaration under Section 564(b)(1) of the Act, 21 U.S.C. section 360bbb-3(b)(1), unless the authorization is terminated or revoked.     Resp Syncytial Virus by PCR NEGATIVE NEGATIVE Final    Comment: (NOTE) Fact Sheet for Patients: BloggerCourse.com  Fact Sheet for Healthcare Providers: SeriousBroker.it  This test is not yet approved or cleared by the [place]  FDA and has been authorized for detection and/or diagnosis of SARS-CoV-2 by FDA under an Emergency Use Authorization (EUA). This EUA will remain in effect (meaning this test can be used) for the duration of the COVID-19  declaration under Section 564(b)(1) of the Act, 21  U.S.C. section 360bbb-3(b)(1), unless the authorization is terminated or revoked.  Performed at [facility], [address], [place], [place] [address]   Culture, blood (Routine X 2) w Reflex to ID Panel     Status: None (Preliminary result)   Collection Time: 10/06/23  6:15 AM   Specimen: BLOOD RIGHT HAND  Result Value Ref Range Status   Specimen Description BLOOD RIGHT HAND  Final   Special Requests   Final    BOTTLES DRAWN AEROBIC AND ANAEROBIC Blood Culture adequate volume   Culture   Final    NO GROWTH 3 DAYS Performed at [facility], [address], [place], [place] [address]    Report Status PENDING  Incomplete  MRSA Next Gen by PCR, Nasal     Status: Abnormal   Collection Time: 10/06/23  8:08 AM   Specimen: Urine, Catheterized; Nasal Swab  Result Value Ref Range Status   MRSA by PCR Next Gen DETECTED (A) NOT DETECTED Final    Comment: RESULT CALLED TO, READ BACK BY AND VERIFIED WITH: RN ZAHRA [id number] AT 1101 BY EC (NOTE) The GeneXpert MRSA Assay (FDA approved for NASAL specimens only), is one component of a comprehensive MRSA colonization surveillance program. It is not intended to diagnose MRSA infection nor to guide or monitor treatment for MRSA infections. Test performance is not FDA approved in patients less than 10 years old. Performed at [facility], [address] [place], [place] [address]          Radiology Studies: No results found.       Scheduled Meds:  sodium chloride    Intravenous Once   aspirin   81 mg Oral Daily   Chlorhexidine  Gluconate Cloth  6 each Topical Daily   furosemide   80 mg Intravenous Daily   mupirocin  ointment  1 Application Nasal BID   sodium chloride  flush  10-40 mL Intracatheter Q12H   Continuous Infusions:  ceFEPime  (MAXIPIME ) IV 2 g (10/09/23 0527)   vancomycin  Stopped (10/08/23 1002)     LOS: 3 days    Time spent: 55 minutes    Renato Applebaum, MD [facility]

## 2023-10-09 NOTE — Evaluation (Signed)
 Occupational Therapy Evaluation Patient Details Name: Jacqueline Hancock MRN: [id number] DOB: 1940/10/01 Today's Date: 10/09/2023   History of Present Illness   83 year old female admitted to ICU on 7/4 with sudden onset of shortness of breath where she woke up from sleep with extreme shortness of breath.  In the emergency room, she was hypotensive and tachycardic.  CT angiogram was negative for PE but consistent with bilateral pulmonary edema.  Initially started on BiPAP with improvement of work of breathing, however became more somnolent so was intubated for hypercapnia. Found to have acute resp failure due to NSTEMI.   Patient was treated for flash pulmonary edema, found to have acute congestive heart failure.  Ultimately extubated 7/5 and transferred out of ICU. 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 Pt reports typically Modified Independent with ADLs/mobility using RW but assistance needed currently for LB ADLs, bed mobility and stair mgmt. Pt presents with deficits in standing balance, endurance and strength. Pt requires Setup for UB ADL and Min-Mod A for LB ADLs. Educated re: AE for LB ADLs with pt able to return demo AE use well, rolllator and brake mgmt, as well as energy conservation with handouts provided. Recommend HHOT follow up upon DC.     If plan is discharge home, recommend the following:   A little help with walking and/or transfers;A little help with bathing/dressing/bathroom;Assistance with cooking/housework;Assist for transportation;Help with stairs or ramp for entrance     Functional Status Assessment   Patient has had a recent decline in their functional status and demonstrates the ability to make significant improvements in function in a reasonable  and predictable amount of time.     Equipment Recommendations   Other (comment) Aeronautical engineer)     Recommendations for Other Services         Precautions/Restrictions   Precautions Precautions: Fall Restrictions Weight Bearing Restrictions Per Provider Order: No     Mobility Bed Mobility               General bed mobility comments: in recliner on entry    Transfers Overall transfer level: Needs assistance Equipment used: Rollator (4 wheels) Transfers: Sit to/from Stand Sit to Stand: Contact guard assist           General transfer comment: cues to manage brakes with good carryover      Balance Overall balance assessment: Needs assistance Sitting-balance support: No upper extremity supported, Feet supported Sitting balance-Leahy Scale: Fair     Standing balance support: Bilateral upper extremity supported, During functional activity Standing balance-Leahy Scale: Poor                             ADL either performed or assessed with clinical judgement   ADL Overall ADL's : Needs assistance/impaired Eating/Feeding: Independent   Grooming: Contact guard assist;Standing   Upper Body Bathing: Set up;Sitting   Lower Body Bathing: Moderate assistance;Sitting/lateral leans;Sit to/from stand   Upper Body Dressing : Set up;Sitting   Lower Body Dressing: Moderate assistance;Minimal assistance;With adaptive equipment;Sitting/lateral leans;Sit to/from stand Lower Body Dressing Details (indicate cue type and reason): Educated on use of reacher to doff socks and sock aid to don socks with pt able to return demo. Pt recently using reacher to don pants at home. educated on  use of shoehorn and long handled sponge and provided AE handout for pt/family reference. Pt's daughter works for [employer]- likely able to get this AE             Functional mobility during ADLs: Contact guard assist;Rollator (4 wheels) General ADL Comments: Educated on AE for LB  ADL, rollator introduction and energy conservation     Vision Baseline Vision/History: 1 Wears glasses Ability to See in Adequate Light: 0 Adequate Patient Visual Report: No change from baseline Vision Assessment?: No apparent visual deficits     Perception         Praxis         Pertinent Vitals/Pain Pain Assessment Pain Assessment: Faces Faces Pain Scale: Hurts a little bit Pain Location: bottom Pain Descriptors / Indicators: Grimacing, Guarding Pain Intervention(s): Monitored during session, Limited activity within patient's tolerance     Extremity/Trunk Assessment Upper Extremity Assessment Upper Extremity Assessment: Generalized weakness;Right hand dominant   Lower Extremity Assessment Lower Extremity Assessment: Defer to PT evaluation   Cervical / Trunk Assessment Cervical / Trunk Assessment: Kyphotic   Communication Communication Communication: No apparent difficulties   Cognition Arousal: Alert Behavior During Therapy: WFL for tasks assessed/performed Cognition: No apparent impairments                               Following commands: Intact       Cueing  General Comments   Cueing Techniques: Verbal cues  Daughter present and supportive   Exercises     Shoulder Instructions      Home Living Family/patient expects to be discharged to:: Private residence Living Arrangements: Alone;Other (Comment) (but son has been staying with pt recently) Available Help at Discharge: Family;Available 24 hours/day Type of Home: House Home Access: Stairs to enter [employer] of Steps: 2 Entrance Stairs-Rails: Left;Right Home Layout: One level     Bathroom Shower/Tub: Walk-in shower;Sponge bathes at baseline   [employer]: Standard Bathroom Accessibility: Yes   Home Equipment: Agricultural consultant (2 wheels);Shower seat;BSC/3in1          Prior Functioning/Environment Prior Level of Function : Needs assist              Mobility Comments: Gets up to her recliner and typically stays there during the day. uses rw to walk to bathroom. assist needed to get in and out of bed recently ADLs Comments: previously Mod I for ADLs. Son has been helping with LB dressing, daughter helping with sponge bathing and family assisting with IADLs    OT Problem List: Decreased strength;Decreased activity tolerance;Impaired balance (sitting and/or standing);Decreased knowledge of use of DME or AE   OT Treatment/Interventions: Self-care/ADL training;Therapeutic exercise;Energy conservation;DME and/or AE instruction;Therapeutic activities;Patient/family education;Balance training      OT Goals(Current goals can be found in the care plan section)   Acute Rehab OT Goals Patient Stated Goal: increase strength OT Goal Formulation: With patient Time For Goal Achievement: 10/23/23 Potential to Achieve Goals: Good ADL Goals Pt Will Perform Lower Body Bathing: with adaptive equipment;sit to/from stand;with supervision Pt Will Perform Lower Body Dressing: with supervision;with adaptive equipment;sitting/lateral leans;sit to/from stand Pt Will Transfer to Toilet: with modified independence;ambulating Pt/caregiver will Perform Home Exercise Program: Increased strength;Both right and left upper extremity;With theraband;Independently;With written HEP provided Additional ADL Goal #1: Pt to verbalize at least 3 energy conservation strategies to implement in home environment.   OT Frequency:  Min 2X/week    Co-evaluation  AM-PAC OT 6 Clicks Daily Activity     Outcome Measure Help from another person eating meals?: None Help from another person taking care of personal grooming?: A Little Help from another person toileting, which includes using toliet, bedpan, or urinal?: A Little Help from another person bathing (including washing, rinsing, drying)?: A Lot Help from another person to put on and taking off regular  upper body clothing?: A Little Help from another person to put on and taking off regular lower body clothing?: A Lot 6 Click Score: 17   End of Session Equipment Utilized During Treatment: Rollator (4 wheels) Nurse Communication: Mobility status  Activity Tolerance: Patient tolerated treatment well Patient left: in chair;with call bell/phone within reach;with family/visitor present  OT Visit Diagnosis: Unsteadiness on feet (R26.81);Other abnormalities of gait and mobility (R26.89);Muscle weakness (generalized) (M62.81)                Time: 8766-8695 OT Time Calculation (min): 31 min Charges:  OT General Charges $OT Visit: 1 Visit OT Evaluation $OT Eval Moderate Complexity: 1 Mod OT Treatments $Self Care/Home Management : 8-22 mins  Mliss NOVAK, OTR/L Acute Rehab Services Office: [phone]   Mliss Fish 10/09/2023, 1:23 PM

## 2023-10-09 NOTE — Plan of Care (Signed)

## 2023-10-09 NOTE — Progress Notes (Signed)
 Mobility Specialist Progress Note;   10/09/23 0937  Mobility  Activity Ambulated with assistance in room  Level of Assistance Contact guard assist, steadying assist  Assistive Device None  Distance Ambulated (ft) 20 ft  Activity Response Tolerated well  Mobility Referral Yes  Mobility visit 1 Mobility  Mobility Specialist Start Time (ACUTE ONLY) [id number]  Mobility Specialist Stop Time (ACUTE ONLY) 0949  Mobility Specialist Time Calculation (min) (ACUTE ONLY) 12 min   Pt in chair upon arrival, agreeable to mobility. Required light MinG assistance during ambulation. Pt deferred hallway ambulation, no reason specified. VSS on RA. Pt returned back to chair with all needs met, call bell in reach. Family present.   Lauraine Erm Mobility Specialist Please contact via SecureChat or [employer] [phone]

## 2023-10-09 NOTE — Progress Notes (Addendum)
   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   Presented to the ED on 7/4 with chest pain, shortness of breath, diaphoresis, and lightheadedness. Severely hypertensive and tachycardic on arrival to the ED. CTA negative for PE. Hs troponin elevated, 63>>343>>1186.  BNP elevated 1289. Intubated for respiratory failure secondary to flash pulmonary edema after failing BiPAP. Started on lasix  drip. ECHO on 7/4 with LVEF 25-30% (was 60-65% in 08/2023), both global and RWMA, entire lateral wall, anterior septum, and basal inferior segment are hypokinetic. Cardiology was consulted. Extubated on 7/5. Cath limited at this time due to anemia requiring blood transfusion.   States her breathing is doing better. Up sitting in the chair. Still has LE edema in shin/calf region but feet and ankles more with trace edema. Legs and feet are cool but she states this is normal for her. Discussed goals of medication therapy. No questions or concerns at this time.   Current HF Medications: Diuretic: furosemide  80 mg IV daily  Prior to admission HF Medications: Diuretic: torsemide  20 mg daily PRN  Pertinent Lab Values: Serum creatinine 1.98, BUN 36, Potassium 4.4, Sodium 141, BNP 1289.3, Magnesium  2.0  Vital Signs: Weight: 129 lbs (admission weight: 146 lbs) Blood pressure: 120-140/80s  Heart rate: 90-100s  I/O: net -1.8L yesterday; net -5.1L since admission  Medication Assistance / Insurance Benefits Check: Does the patient have prescription insurance?  Yes Type of insurance plan: [facility] Medicare  Outpatient Pharmacy:  Prior to admission outpatient pharmacy: [facility] Is the patient willing to use [facility] TOC pharmacy at discharge? Yes Is the patient willing to transition their outpatient pharmacy to utilize a [facility] outpatient pharmacy?   No    Assessment: 1. Acute systolic CHF (LVEF 25-30%), due to cardiotoxicity vs ischemia. NYHA class III symptoms. - Continue furosemide  80 mg IV daily. Consider adding compression stockings to assist with LE edema. Strict I/Os and daily weights - Consider starting BB once euvolemic - Consider starting ARB, MRA, and SGLT2i once AKI resolves - baseline creatinine closer to 1.5.   Plan: 1) Medication changes recommended at this time: - Add compression stockings  2) Patient assistance: - $100.59 left on deductible  - Entresto/Farxiga/Jardiance copay - First fill $147.59, then $47 per month going forward. - Patient states this may be difficult to afford - likely qualifies for grant for copay assistance  3)  Education  - Initial education completed  - Full education to be completed prior to discharge  Duwaine Plant, PharmD, BCPS Heart Failure Stewardship Pharmacist Phone [phone]

## 2023-10-10 DIAGNOSIS — J9602 Acute respiratory failure with hypercapnia: Secondary | ICD-10-CM | POA: Diagnosis not present

## 2023-10-10 DIAGNOSIS — J9601 Acute respiratory failure with hypoxia: Secondary | ICD-10-CM | POA: Diagnosis not present

## 2023-10-10 LAB — MAGNESIUM: Magnesium: 1.9 mg/dL (ref 1.7–2.4)

## 2023-10-10 LAB — BASIC METABOLIC PANEL WITH GFR
Anion gap: 12 (ref 5–15)
BUN: 48 mg/dL — ABNORMAL HIGH (ref 8–23)
CO2: 26 mmol/L (ref 22–32)
Calcium: 8.8 mg/dL — ABNORMAL LOW (ref 8.9–10.3)
Chloride: 98 mmol/L (ref 98–111)
Creatinine, Ser: 1.71 mg/dL — ABNORMAL HIGH (ref 0.44–1.00)
GFR, Estimated: 29 mL/min — ABNORMAL LOW (ref >60)
Glucose, Bld: 123 mg/dL — ABNORMAL HIGH (ref 70–99)
Potassium: 3.8 mmol/L (ref 3.5–5.1)
Sodium: 136 mmol/L (ref 135–145)

## 2023-10-10 LAB — CBC
HCT: 28.6 % — ABNORMAL LOW (ref 36.0–46.0)
Hemoglobin: 9.1 g/dL — ABNORMAL LOW (ref 12.0–15.0)
MCH: 28.4 pg (ref 26.0–34.0)
MCHC: 31.8 g/dL (ref 30.0–36.0)
MCV: 89.4 fL (ref 80.0–100.0)
Platelets: 172 K/uL (ref 150–400)
RBC: 3.2 MIL/uL — ABNORMAL LOW (ref 3.87–5.11)
RDW: 14.7 % (ref 11.5–15.5)
WBC: 6 K/uL (ref 4.0–10.5)
nRBC: 0 % (ref 0.0–0.2)

## 2023-10-10 MED ORDER — AMOXICILLIN-POT CLAVULANATE 500-125 MG PO TABS
1.0000 | ORAL_TABLET | Freq: Two times a day (BID) | ORAL | Status: AC
  Administered 2023-10-10 – 2023-10-11 (×3): 1 via ORAL
  Filled 2023-10-10 (×3): qty 1

## 2023-10-10 MED ORDER — ONDANSETRON HCL 4 MG/2ML IJ SOLN
4.0000 mg | Freq: Four times a day (QID) | INTRAMUSCULAR | Status: DC | PRN
  Administered 2023-10-10 – 2023-10-12 (×4): 4 mg via INTRAVENOUS
  Filled 2023-10-10 (×4): qty 2

## 2023-10-10 NOTE — Progress Notes (Signed)
 Rounding Note   Patient Name: Jacqueline Hancock Date of Encounter: 10/10/2023  [facility] Cardiologist: Jacqueline LITTIE [name], MD Hancock  Subjective Extubated 7/5, now on room air.  I/o inaccurate as family states she is urinating a lot.  AM labs  show improving creatinine.   Scheduled Meds:  amoxicillin -clavulanate  1 tablet Oral Q12H   aspirin   81 mg Oral Daily   Chlorhexidine  Gluconate Cloth  6 each Topical Daily   furosemide   80 mg Intravenous Daily   mupirocin  ointment  1 Application Nasal BID   sodium chloride  flush  10-40 mL Intracatheter Q12H   Continuous Infusions:   PRN Meds: acetaminophen , albuterol , docusate, guaiFENesin -dextromethorphan , polyethylene glycol, sodium chloride  flush   Vital Signs  Vitals:   10/10/23 0200 10/10/23 0400 10/10/23 0551 10/10/23 0754  BP:  137/78  (!) 146/86  Pulse: 91 87  94  Resp: 20 20  20   Temp:  98.5 F (36.9 C)  98 F (36.7 C)  TempSrc:  Oral  Oral  SpO2: 97% 98%  98%  Weight:   58.8 kg   Height:        Intake/Output Summary (Last 24 hours) at 10/10/2023 1121 Last data filed at 10/10/2023 0500 Gross per 24 hour  Intake 600 ml  Output 625 ml  Net -25 ml      10/10/2023    5:51 AM 10/09/2023    4:20 AM 10/08/2023    6:42 AM  Last 3 Weights  Weight (lbs) 129 lb 10.1 oz 129 lb 13.6 oz 132 lb 7.9 oz  Weight (kg) 58.8 kg 58.9 kg 60.1 kg      Telemetry NSR, ST - Personally Reviewed  ECG  Sinus tach 7/4- Personally Reviewed  Physical Exam GEN: alert Neck:  JVD to lower 1/3 of neck at 60 deg Cardiac: RRR, no murmurs Respiratory: grossly clear  GI: Soft, nontender MS: 1+ LE edema, warm Neuro:  nonfocal Psych: normal mood and affect  Labs High Sensitivity Troponin:   Recent Labs  Lab 10/06/23 0416 10/06/23 0615 10/06/23 0816 10/06/23 1059  TROPONINIHS 63* 343* 1,163* 1,186*     Chemistry Recent Labs  Lab 10/04/23 1100 10/06/23 0416 10/06/23 0605 10/07/23 0144 10/08/23 0500 10/08/23 2113  10/09/23 0500  NA 141 139   < > 139 138  --  141  K 3.6 3.7   < > 4.0 3.2*  --  4.4  CL 103 105   < > 104 99  --  102  CO2 29 20*   < > 20* 25  --  25  GLUCOSE 103* 124*   < > 99 101*  --  101*  BUN 24* 29*   < > 33* 35*  --  36*  CREATININE 1.61* 1.63*   < > 2.03* 2.07*  --  1.98*  CALCIUM 8.5* 8.4*   < > 8.2* 8.4*  --  8.8*  MG  --   --   --  1.5* 2.2 2.0  --   PROT 5.8* 5.7*  --   --   --   --   --   ALBUMIN  3.4* 2.9*  --   --   --   --   --   AST 16 39  --   --   --   --   --   ALT 11 23  --   --   --   --   --   ALKPHOS 109 108  --   --   --   --   --  BILITOT 0.7 1.3*  --   --   --   --   --   GFRNONAA 32* 31*   < > 24* 23*  --  25*  ANIONGAP 9 14   < > 15 14  --  14   < > = values in this interval not displayed.    Lipids  Recent Labs  Lab 10/08/23 0500  CHOL 165  TRIG 147  HDL 34*  LDLCALC 102*  CHOLHDL 4.9    Hematology Recent Labs  Lab 10/08/23 0500 10/09/23 0500 10/10/23 0457  WBC 7.7 6.6 6.0  RBC 2.48* 3.07* 3.20*  HGB 7.0* 8.7* 9.1*  HCT 22.0* 27.6* 28.6*  MCV 88.7 89.9 89.4  MCH 28.2 28.3 28.4  MCHC 31.8 31.5 31.8  RDW 15.0 15.4 14.7  PLT 148* 157 172   Thyroid  No results for input(s): TSH, FREET4 in the last 168 hours.  BNP Recent Labs  Lab 10/06/23 0416  BNP 1,289.3*    DDimer No results for input(s): DDIMER in the last 168 hours.   Radiology  No results found.   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  She was intubated due to hypoxic respiratory failure.   Assessment & Plan  NSTEMI: Presented with chest pain and SOB, plan to be in acute hypoxic respiratory failure and was intubated on 7/4.  EKG showed sinus tachycardia, no ischemia. Hs troponin elevated, 63>>343>>1186.  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  -Complicated situation. She is on chemotherapy and has had recent  anemia with Hgb down to 6.2 on 6/19.  She was started on heparin  drip and aspirin  but became progressively anemic, heparin  gtt stopped.  Received 1 unit PRBCs for goal hemoglobin < 8.  With her anemia she is not a candidate for cardiac catheterization at this time. - no cp or sob today. Tolerating diuresis. Continue lasix  80 mg IV daily today and tomorrow. Continued improvement.   Acute hypoxic respiratory failure secondary to flash pulmonary edema: Intubated on 7/4, presented with chest pains and SOB. Echo from May 2025 showed normal LVEF, normal RV function, mild to moderate MR, trivial AI and CVP 3 mmHg.  Limited echocardiogram obtained 7/4 showed LVEF 25 to 30% with RWMA.  Imaging evidence of flash pulmonary edema/pulmonary vascular congestion. BNP elevated, 1289.  Serum creatinine increased from 1.6 in the ER to 2.07 upon arrival to the ICU.   -She was started on IV Lasix  drip at 12 mg/h in ICU.  Good diuresis, switched to IV Lasix  80 mg daily on 7/5.  She was extubated 7/5, now on room air - Diuresed well, Cr improving, continue lasix  IV 80 mg daily today and tomorrow.   Acute systolic heart failure: LVEF 25-30%.  Could be cardiotoxicity from chemo vs ischemia. -Diuresis as above -Will add GDMT as able, limited by renal function.     AKI: Plan as above, monitor with diuresis, slight improvement.    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   D/w primary team, and patient's son and family.   For questions or updates, please contact [facility] Please consult [email or web address] for contact info under     Signed, Jacqueline DELENA [employer]ck, MD  10/10/2023, 11:21 AM

## 2023-10-10 NOTE — Progress Notes (Signed)
 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  In the emergency room, she was hypotensive and tachycardic.  CT angiogram was negative for PE but consistent with bilateral pulmonary edema.  Initially started on BiPAP with improvement of work of breathing, however became more somnolent so was intubated for hypercapnia.  Patient was treated for flash pulmonary edema, found to have acute congestive heart failure.  Ultimately extubated and transferred out of ICU. 7/4, hypercapnic and somnolent.  Intubated and admitted to ICU. 7/5, extubated and was ultimately on room air.  Out of ICU. Found to have acute congestive heart failure and followed by cardiology.  Subjective: Patient seen and examined.  No overnight events.  She was able to get up and walk to the hallway yesterday.  Remains afebrile.  WBC count is adequate.  Assessment & Plan:   Acute hypoxemic and hypercapnic respiratory failure due to volume overload. secondary to non-STEMI causing acute systolic heart failure with flash pulmonary edema: Intubated for airway protection, now extubated to room air.  Continue aggressive chest physiotherapy. Also noted to have leukocytosis and high procalcitonin, treated with antibiotics, she completed 4 days of vancomycin  cefepime  and azithromycin .  Will continue Augmentin  for additional 3 days to complete 7 days of therapy.  Cultures are negative.   Non-STEMI Acute congestive heart failure, ejection fraction 25% with plus pulmonary edema Patient currently remains on heparin  infusion. Started on aspirin .  Treated with IV Lasix   infusion and now on IV Lasix .   Clinically improving. Potassium adequate. Echocardiogram with ejection fraction 25%, regional wall motion abnormality. Patient with chemotherapy, anemia so cardiology is recommending against cardiac catheterization at this time. Anticipate conservative management with follow-up.  Anemia, acute on chronic.  Large B-cell lymphoma with histological transformation: Patient is on Imbruvica . Monitor PRBC transfusion for hemoglobin of 7.  Appropriately responded.  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  Close monitoring on dialysis.  Hypertension with hypertensive urgency on admission: Not on medications at home.  Monitoring blood pressure.  Currently stabilizing.  Medically stabilizing.  Continue to mobilize.  Discharge to home when cleared by cardiology.   DVT prophylaxis: Place TED hose Start: 10/09/23 1359 Place and maintain sequential compression device Start: 10/08/23 0753   Code Status: Full code Family Communication: None at the bedside Disposition Plan: Status is: Inpatient Remains inpatient appropriate because: IV diuresis.  Medically improving.     Consultants:  Cardiology Critical care  Procedures:  None  Antimicrobials:  Vancomycin , cefepime  and azithromycin  7/4--- 7/8 Augmentin  7/8---     Objective: Vitals:   10/10/23 0200 10/10/23 0400 10/10/23 0551 10/10/23 0754  BP:  137/78  (!) 146/86  Pulse: 91 87  94  Resp: 20 20  20   Temp:  98.5 F (36.9 C)  98 F (36.7 C)  TempSrc:  Oral  Oral  SpO2: 97% 98%  98%  Weight:   58.8 kg   Height:        Intake/Output Summary (Last 24 hours) at 10/10/2023 1111 Last data filed at 10/10/2023 0500 Gross per 24 hour  Intake 600 ml  Output 625 ml  Net -25 ml   [employer]  10/08/23 0642 10/09/23 0420 10/10/23 0551  Weight: 60.1 kg 58.9 kg 58.8 kg    Examination:  General exam: Looks comfortable.  Pleasant and interactive. Respiratory system: Some conducted upper airway  sounds.. Cardiovascular system: S1 & S2 heard, RRR.  No edema. Right IJ Port-A-Cath present. Gastrointestinal system: Soft.  Nontender.  Bowel sound present. Central nervous system: Alert and oriented. No focal neurological deficits.  Data Reviewed: I have personally reviewed following labs and imaging studies  CBC: Recent Labs  Lab 10/04/23 1100 10/06/23 0416 10/06/23 0816 10/06/23 0845 10/07/23 0144 10/08/23 0500 10/09/23 0500 10/10/23 0457  WBC 13.7*   < > 21.5*  --  14.6* 7.7 6.6 6.0  NEUTROABS 10.1*  --   --   --   --   --   --   --   HGB 9.0*   < > 9.2* 8.8* 8.0* 7.0* 8.7* 9.1*  HCT 28.3*   < > 30.1* 26.0* 25.3* 22.0* 27.6* 28.6*  MCV 88.7   < > 93.2  --  89.7 88.7 89.9 89.4  PLT 147*   < > 187  --  147* 148* 157 172   < > = values in this interval not displayed.   Basic Metabolic Panel: Recent Labs  Lab 10/06/23 0416 10/06/23 0605 10/06/23 0816 10/06/23 0845 10/06/23 1612 10/07/23 0144 10/08/23 0500 10/08/23 2113 10/09/23 0500  NA 139   < >  --  140 140 139 138  --  141  K 3.7   < >  --  3.1* 4.0 4.0 3.2*  --  4.4  CL 105  --   --   --  107 104 99  --  102  CO2 20*  --   --   --  20* 20* 25  --  25  GLUCOSE 124*  --   --   --  118* 99 101*  --  101*  BUN 29*  --   --   --  31* 33* 35*  --  36*  CREATININE 1.63*  --  1.88*  --  2.07* 2.03* 2.07*  --  1.98*  CALCIUM 8.4*  --   --   --  7.9* 8.2* 8.4*  --  8.8*  MG  --   --   --   --   --  1.5* 2.2 2.0  --   PHOS  --   --   --   --   --  5.0*  --   --   --    < > = values in this interval not displayed.   GFR: Estimated Creatinine Clearance: 16.8 mL/min (A) (by C-G formula based on SCr of 1.98 mg/dL (H)). Liver Function Tests: Recent Labs  Lab 10/04/23 1100 10/06/23 0416  AST 16 39  ALT 11 23  ALKPHOS 109 108  BILITOT 0.7 1.3*  PROT 5.8* 5.7*  ALBUMIN  3.4* 2.9*   No results for input(s): LIPASE, AMYLASE in the last 168 hours. No results for input(s): AMMONIA in the last 168  hours. Coagulation Profile: Recent Labs  Lab 10/06/23 0416  INR 1.2   Cardiac Enzymes: No results for input(s): CKTOTAL, CKMB, CKMBINDEX, TROPONINI in the last 168 hours. BNP (last 3 results) Recent Labs    02/21/23 1206  PROBNP 14.0   HbA1C: No results for input(s): HGBA1C in the last 72 hours. CBG: Recent Labs  Lab 10/06/23 2326 10/07/23 0339 10/07/23 0753 10/07/23 1121 10/07/23 1554  GLUCAP 102* 102* 108* 105* 95  Lipid Profile: Recent Labs    10/08/23 0500  CHOL 165  HDL 34*  LDLCALC 102*  TRIG 147  CHOLHDL 4.9   Thyroid  Function Tests: No results for input(s): TSH, T4TOTAL, FREET4, T3FREE, THYROIDAB in the last 72 hours. Anemia Panel: No results for input(s): VITAMINB12, FOLATE, FERRITIN, TIBC, IRON, RETICCTPCT in the last 72 hours. Sepsis Labs: Recent Labs  Lab 10/06/23 0542 10/06/23 0816 10/06/23 0847 10/06/23 0950 10/06/23 1059  PROCALCITON  --  7.35  --   --   --   LATICACIDVEN 2.7*  --  2.0* 2.3* 2.0*    Recent Results (from the past 240 hours)  Culture, Urine     Status: None   Collection Time: 10/04/23  6:12 PM   Specimen: Urine, Clean Catch  Result Value Ref Range Status   Specimen Description   Final    URINE, CLEAN CATCH Performed at [facility], [address] [place], [place] [address]    Special Requests   Final    NONE Performed at [facility], [address], [place], [place] [address]    Culture   Final    NO GROWTH Performed at [facility], [address], [place], [place] [address]    Report Status 10/05/2023 FINAL  Final  Culture, blood (Routine X 2) w Reflex to ID Panel     Status: None (Preliminary result)   Collection Time: 10/06/23  5:35 AM   Specimen: BLOOD  Result Value Ref Range Status   Specimen Description BLOOD RIGHT ANTECUBITAL  Final   Special Requests   Final    BOTTLES DRAWN AEROBIC AND ANAEROBIC Blood Culture results  may not be optimal due to an inadequate volume of blood received in culture bottles   Culture   Final    NO GROWTH 4 DAYS Performed at [facility], [address], [place], [place] [address]    Report Status PENDING  Incomplete  Resp panel by RT-PCR (RSV, Flu A&B, Covid) Anterior Nasal Swab     Status: None   Collection Time: 10/06/23  5:50 AM   Specimen: Anterior Nasal Swab  Result Value Ref Range Status   SARS Coronavirus 2 by RT PCR NEGATIVE NEGATIVE Final   Influenza A by PCR NEGATIVE NEGATIVE Final   Influenza B by PCR NEGATIVE NEGATIVE Final    Comment: (NOTE) The Xpert Xpress SARS-CoV-2/FLU/RSV plus assay is intended as an aid in the diagnosis of influenza from Nasopharyngeal swab specimens and should not be used as a sole basis for treatment. Nasal washings and aspirates are unacceptable for Xpert Xpress SARS-CoV-2/FLU/RSV testing.  Fact Sheet for Patients: BloggerCourse.com  Fact Sheet for Healthcare Providers: SeriousBroker.it  This test is not yet approved or cleared by the [place]  FDA and has been authorized for detection and/or diagnosis of SARS-CoV-2 by FDA under an Emergency Use Authorization (EUA). This EUA will remain in effect (meaning this test can be used) for the duration of the COVID-19 declaration under Section 564(b)(1) of the Act, 21 U.S.C. section 360bbb-3(b)(1), unless the authorization is terminated or revoked.     Resp Syncytial Virus by PCR NEGATIVE NEGATIVE Final    Comment: (NOTE) Fact Sheet for Patients: BloggerCourse.com  Fact Sheet for Healthcare Providers: SeriousBroker.it  This test is not yet approved or cleared by the [place]  FDA and has been authorized for detection and/or diagnosis of SARS-CoV-2 by FDA under an Emergency Use Authorization (EUA). This EUA will remain in  effect (meaning this test can be used) for the  duration of the COVID-19 declaration under Section 564(b)(1) of the Act, 21 U.S.C. section 360bbb-3(b)(1), unless the authorization is terminated or revoked.  Performed at [facility], [address] [place], [place] [address]   Culture, blood (Routine X 2) w Reflex to ID Panel     Status: None (Preliminary result)   Collection Time: 10/06/23  6:15 AM   Specimen: BLOOD RIGHT HAND  Result Value Ref Range Status   Specimen Description BLOOD RIGHT HAND  Final   Special Requests   Final    BOTTLES DRAWN AEROBIC AND ANAEROBIC Blood Culture adequate volume   Culture   Final    NO GROWTH 4 DAYS Performed at [facility], [address], [place], [place] [address]    Report Status PENDING  Incomplete  MRSA Next Gen by PCR, Nasal     Status: Abnormal   Collection Time: 10/06/23  8:08 AM   Specimen: Urine, Catheterized; Nasal Swab  Result Value Ref Range Status   MRSA by PCR Next Gen DETECTED (A) NOT DETECTED Final    Comment: RESULT CALLED TO, READ BACK BY AND VERIFIED WITH: RN ZAHRA [id number] AT 1101 BY EC (NOTE) The GeneXpert MRSA Assay (FDA approved for NASAL specimens only), is one component of a comprehensive MRSA colonization surveillance program. It is not intended to diagnose MRSA infection nor to guide or monitor treatment for MRSA infections. Test performance is not FDA approved in patients less than 57 years old. Performed at [facility], [address] [place], [place] [address]          Radiology Studies: No results found.       Scheduled Meds:  amoxicillin -clavulanate  1 tablet Oral Q12H   aspirin   81 mg Oral Daily   Chlorhexidine  Gluconate Cloth  6 each Topical Daily   furosemide   80 mg Intravenous Daily   mupirocin  ointment  1 Application Nasal BID   sodium chloride  flush  10-40 mL Intracatheter Q12H   Continuous Infusions:     LOS: 4 days    Time spent: 40 minutes    Renato Applebaum, MD [facility]

## 2023-10-10 NOTE — Progress Notes (Signed)
 PT Cancellation Note  Patient Details Name: ANNSLEE TERCERO MRN: [id number] DOB: 01-27-1941   Cancelled Treatment:    Reason Eval/Treat Not Completed: (P) Medical issues which prohibited therapy (pt reports recent episode of nausea/vomiting (blue emesis bag obtained for patient). RN notified of pt symptoms.) Pt defers repositioning or return to supine at this time due to nausea, comfortable in recliner. Will continue efforts per PT plan of care as schedule permits.   Vallie Fayette M Arvis Miguez 10/10/2023, 3:12 PM

## 2023-10-10 NOTE — Progress Notes (Signed)
 Mobility Specialist Progress Note;   10/10/23 0943  Mobility  Activity Transferred from bed to chair  Level of Assistance Minimal assist, patient does 75% or more  Assistive Device Front wheel walker  Distance Ambulated (ft) 5 ft  Activity Response Tolerated well  Mobility Referral Yes  Mobility visit 1 Mobility  Mobility Specialist Start Time (ACUTE ONLY) [id number]  Mobility Specialist Stop Time (ACUTE ONLY) 0957  Mobility Specialist Time Calculation (min) (ACUTE ONLY) 14 min   Pt agreeable to mobility. Required MinA for bed mobility, MinG to safely transfer from bed to chair. VSS on RA and no c/o when asked. Pt left in chair with all needs met, call bell in reach. Alarm on.   Lauraine Erm Mobility Specialist Please contact via SecureChat or [employer] [phone]

## 2023-10-10 NOTE — Progress Notes (Addendum)
   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   Presented to the ED on 7/4 with chest pain, shortness of breath, diaphoresis, and lightheadedness. Severely hypertensive and tachycardic on arrival to the ED. CTA negative for PE. Hs troponin elevated, 63>>343>>1186.  BNP elevated 1289. Intubated for respiratory failure secondary to flash pulmonary edema after failing BiPAP. Started on lasix  drip. ECHO on 7/4 with LVEF 25-30% (was 60-65% in 08/2023), both global and RWMA, entire lateral wall, anterior septum, and basal inferior segment are hypokinetic. Cardiology was consulted. Extubated on 7/5. Cath limited at this time due to anemia requiring blood transfusion.   States she is feeling pretty well today. States her breathing is about the same as yesterday. Still has orthopnea. Up sitting in the chair. Has LE edema in shin/calf region but feet and ankles more with trace edema. Does not have TED hose on yet. Legs and feet are cool but she states this is normal for her. Discussed goals of medication therapy. No questions or concerns at this time.   Current HF Medications: Diuretic: furosemide  80 mg IV daily  Prior to admission HF Medications: Diuretic: torsemide  20 mg daily PRN  Pertinent Lab Values: Labs pending 7/8 As of 7/7: Serum creatinine 1.98, BUN 36, Potassium 4.4, Sodium 141, BNP 1289.3, Magnesium  2.0  Vital Signs: Weight: 129 lbs (admission weight: 146 lbs) Blood pressure: 120-140/80s  Heart rate: 90-100s  I/O: net +0.3L yesterday; net -5.2L since admission  Medication Assistance / Insurance Benefits Check: Does the patient have prescription insurance?  Yes Type of insurance plan: [facility] Medicare  Outpatient Pharmacy:  Prior to admission outpatient pharmacy:  [facility] Is the patient willing to use [facility] TOC pharmacy at discharge? Yes Is the patient willing to transition their outpatient pharmacy to utilize a [facility] outpatient pharmacy?   No    Assessment: 1. Acute systolic CHF (LVEF 25-30%), due to cardiotoxicity vs ischemia. NYHA class III symptoms. - Continue furosemide  80 mg IV daily. Yesterday, compression stockings were ordered to be placed to assist with LE edema - has not been done yet. Spoke with nursing to see if they can get those to her today. Strict I/Os and daily weights - Consider starting BB once euvolemic - Consider starting ARB, MRA, and SGLT2i once AKI resolves - baseline creatinine closer to 1.5. Labs pending today.    Plan: 1) Medication changes recommended at this time: - Place TED hose   2) Patient assistance: - $100.59 left on deductible - Entresto/Farxiga/Jardiance copay - First fill $147.59, then $47 per month going forward. - Patient states this may be difficult to afford - likely qualifies for grant for copay assistance  3)  Education - Initial education completed - Full education to be completed prior to discharge  Duwaine Plant, PharmD, BCPS Heart Failure Stewardship Pharmacist Phone [phone]

## 2023-10-11 ENCOUNTER — Encounter (HOSPITAL_COMMUNITY): Payer: Self-pay | Admitting: Pulmonary Disease

## 2023-10-11 ENCOUNTER — Telehealth: Payer: Self-pay

## 2023-10-11 DIAGNOSIS — J9602 Acute respiratory failure with hypercapnia: Secondary | ICD-10-CM | POA: Diagnosis not present

## 2023-10-11 DIAGNOSIS — J9601 Acute respiratory failure with hypoxia: Secondary | ICD-10-CM | POA: Diagnosis not present

## 2023-10-11 LAB — BASIC METABOLIC PANEL WITH GFR
Anion gap: 10 (ref 5–15)
BUN: 41 mg/dL — ABNORMAL HIGH (ref 8–23)
CO2: 25 mmol/L (ref 22–32)
Calcium: 8.2 mg/dL — ABNORMAL LOW (ref 8.9–10.3)
Chloride: 99 mmol/L (ref 98–111)
Creatinine, Ser: 1.67 mg/dL — ABNORMAL HIGH (ref 0.44–1.00)
GFR, Estimated: 30 mL/min — ABNORMAL LOW (ref >60)
Glucose, Bld: 103 mg/dL — ABNORMAL HIGH (ref 70–99)
Potassium: 3.5 mmol/L (ref 3.5–5.1)
Sodium: 134 mmol/L — ABNORMAL LOW (ref 135–145)

## 2023-10-11 LAB — CBC
HCT: 28 % — ABNORMAL LOW (ref 36.0–46.0)
Hemoglobin: 9 g/dL — ABNORMAL LOW (ref 12.0–15.0)
MCH: 28.6 pg (ref 26.0–34.0)
MCHC: 32.1 g/dL (ref 30.0–36.0)
MCV: 88.9 fL (ref 80.0–100.0)
Platelets: 166 K/uL (ref 150–400)
RBC: 3.15 MIL/uL — ABNORMAL LOW (ref 3.87–5.11)
RDW: 14.5 % (ref 11.5–15.5)
WBC: 4.9 K/uL (ref 4.0–10.5)
nRBC: 0 % (ref 0.0–0.2)

## 2023-10-11 LAB — CULTURE, BLOOD (ROUTINE X 2)
Culture: NO GROWTH
Culture: NO GROWTH
Special Requests: ADEQUATE

## 2023-10-11 LAB — MAGNESIUM: Magnesium: 1.9 mg/dL (ref 1.7–2.4)

## 2023-10-11 NOTE — Progress Notes (Signed)
 Heart Failure Nurse Navigator Progress Note  PCP: Ngetich, Roxan BROCKS, NP PCP-Cardiologist: Kate Admission Diagnosis: Acute respiratory failure with hypoxia.  Admitted from: Home  Presentation:   Jacqueline Hancock presented with increasing central chest pain, shortness of breath, and diaphoresis with lightheadedness. Patient report she had Chemotherapy the day before.( Lymphoma) Placed on BiPAP. BP 131/79, HR 110, BNP 1,289, Lactic Acid 2.7, Troponin 343, Patient became exhausted from work of breathing, more somnolent, then had worsening mental status, decision was made with son to intubate patient. CT angiogram was negative for PE but consistent with bilateral pulmonary edema. Patient was treated for flash pulmonary edema, found to have acute congestive heart failure. Ultimately extubated ( 7/5) and transferred out of ICU.   Patient and her son were educated on the sign and symptoms of heart failure, daily weights, when to call her doctor or go tot he ED. Diet/ fluid restrictions, ( patient reports to eating foods with salt and drinking Dr. Nunzio soda, ) Continued education on taking all medications as prescribed and attending all medical appointments. Patient and son verbalized their understanding of all education. A HF TOC appointment was scheduled for 10/16/2023 @ 2 pm.   ECHO/ LVEF: 25-30%  Clinical Course:  Past Medical History:  Diagnosis Date   CLL (chronic lymphocytic leukemia) (HCC)    Dyspnea 2021   with exersion    Early cataracts, bilateral    Elevated hemoglobin A1c    Fibroids    GERD (gastroesophageal reflux disease)    Hyperlipidemia    Hypertension    Lymphoma (HCC)    Osteopenia      Social History   Socioeconomic History   Marital status: Widowed    Spouse name: Not on file   Number of children: 3   Years of education: Not on file   Highest education level: Not on file  Occupational History   Occupation: retired  Tobacco Use   Smoking status: Never    Smokeless tobacco: Never  Vaping Use   Vaping status: Never Used  Substance and Sexual Activity   Alcohol use: No   Drug use: Never   Sexual activity: Not on file  Other Topics Concern   Not on file  Social History Narrative   Not on file   Social Drivers of Health   Financial Resource Strain: Not on file  Food Insecurity: No Food Insecurity (10/06/2023)   Hunger Vital Sign    Worried About Running Out of Food in the Last Year: Never true    Ran Out of Food in the Last Year: Never true  Transportation Needs: No Transportation Needs (10/06/2023)   PRAPARE - Administrator, Civil Service (Medical): No    Lack of Transportation (Non-Medical): No  Physical Activity: Not on file  Stress: Not on file  Social Connections: Moderately Integrated (10/06/2023)   Social Connection and Isolation Panel    Frequency of Communication with Friends and Family: More than three times a week    Frequency of Social Gatherings with Friends and Family: Three times a week    Attends Religious Services: More than 4 times per year    Active Member of Clubs or Organizations: Not on file    Attends Club or Organization Meetings: More than 4 times per year    Marital Status: Widowed   Education Assessment and Provision:  Detailed education and instructions provided on heart failure disease management including the following:  Signs and symptoms of Heart Failure When to  call the physician Importance of daily weights Low sodium diet Fluid restriction Medication management Anticipated future follow-up appointments  Patient education given on each of the above topics.  Patient acknowledges understanding via teach back method and acceptance of all instructions.  Education Materials:  Living Better With Heart Failure Booklet, HF zone tool, & Daily Weight Tracker Tool.  Patient has scale at home: Yes Patient has pill box at home: Yes    High Risk Criteria for Readmission and/or Poor Patient  Outcomes: Heart failure hospital admissions (last 6 months): 0  No Show rate: 3 %  Difficult social situation: Lives alone Demonstrates medication adherence: Yes Primary Language: English  Literacy level: Reading, writing, and comprehension.   Barriers of Care:   Diet/ fluid restrictions ( salt/ soda)  Daily weights  Considerations/Referrals:   Referral made to Heart Failure Pharmacist Stewardship: Yes Referral made to Heart Failure CSW/NCM TOC: NA Referral made to Heart & Vascular TOC clinic: Yes, 10/16/2023 @ 2 pm.   Items for Follow-up on DC/TOC: Diet/ fluid restrictions/ daily weights Continued HF education    Stephane Haddock, BSN, RN Heart Failure Teacher, adult education Only

## 2023-10-11 NOTE — Progress Notes (Signed)
 Physical Therapy Treatment Patient Details Name: Jacqueline Hancock MRN: [id number] DOB: [birth year] Today's Date: 10/11/2023   History of Present Illness 83 year old female admitted to ICU on 7/4 with sudden onset of shortness of breath where she woke up from sleep with extreme shortness of breath.  In the emergency room, she was hypotensive and tachycardic.  CT angiogram was negative for PE but consistent with bilateral pulmonary edema.  Initially started on BiPAP with improvement of work of breathing, however became more somnolent so was intubated for hypercapnia. Found to have acute resp failure due to NSTEMI.   Patient was treated for flash pulmonary edema, found to have acute congestive heart failure.  Ultimately extubated 7/5 and transferred out of ICU. 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 Pt was able to ambulate with rollator with overall good safety awareness and no LOB. Pt really wants a rollator so she can rest when she fatigues.  Will check with CM regarding this. PRogressing well overall.  Pt currently with functional limitations due to the deficits listed below (see PT Problem List). Pt will benefit from acute skilled PT to increase their independence and safety with mobility to allow discharge.       If plan is discharge home, recommend the following: A little help with walking and/or transfers;A little help with bathing/dressing/bathroom;Assistance with cooking/housework;Assist for transportation;Help with stairs or ramp for entrance   Can travel by private vehicle        Equipment Recommendations  BSC/3in1;Rollator (4 wheels)    Recommendations for Other Services       Precautions / Restrictions Precautions Precautions: Fall Restrictions Weight Bearing Restrictions Per Provider Order: No     Mobility  Bed Mobility                General bed mobility comments: Pt was in chair on arrival    Transfers Overall transfer level: Needs assistance Equipment used: Rolling walker (2 wheels) Transfers: Sit to/from Stand, Bed to chair/wheelchair/BSC Sit to Stand: Contact guard assist           General transfer comment: CGA for safety    Ambulation/Gait Ambulation/Gait assistance: Contact guard assist, +2 safety/equipment Gait Distance (Feet): 150 Feet Assistive device: Rollator (4 wheels) Gait Pattern/deviations: Step-through pattern, Decreased stride length, Leaning posteriorly, Trunk flexed       General Gait Details: Pt ambulates with slight flexed posture and slow gait. Pt incr distance and didnt need a sitting rest break. VSS   Stairs             Wheelchair Mobility     Tilt Bed    Modified Rankin (Stroke Patients Only)       Balance Overall balance assessment: Needs assistance Sitting-balance support: No upper extremity supported, Feet supported Sitting balance-Leahy Scale: Fair     Standing balance support: Bilateral upper extremity supported, During functional activity Standing balance-Leahy Scale: Poor Standing balance comment: reliant on RW for support                            Communication Communication Communication: No apparent difficulties  Cognition Arousal: Alert Behavior During Therapy: WFL for tasks assessed/performed   PT - Cognitive impairments: No apparent impairments  Following commands: Intact      Cueing Cueing Techniques: Verbal cues  Exercises General Exercises - Lower Extremity Ankle Circles/Pumps: AROM, Both, 10 reps, Seated Long Arc Quad: AROM, Both, 10 reps, Seated    General Comments General comments (skin integrity, edema, etc.): Patient's son present      Pertinent Vitals/Pain Pain Assessment Pain Assessment: No/denies pain    Home Living                           Prior Function            PT Goals (current goals can now be found in the care plan section) Acute Rehab PT Goals Patient Stated Goal: to go home Progress towards PT goals: Progressing toward goals    Frequency    Min 2X/week      PT Plan      Co-evaluation              AM-PAC PT 6 Clicks Mobility   Outcome Measure  Help needed turning from your back to your side while in a flat bed without using bedrails?: None Help needed moving from lying on your back to sitting on the side of a flat bed without using bedrails?: None Help needed moving to and from a bed to a chair (including a wheelchair)?: A Little Help needed standing up from a chair using your arms (e.g., wheelchair or bedside chair)?: A Little Help needed to walk in hospital room?: A Little Help needed climbing 3-5 steps with a railing? : A Lot 6 Click Score: 19    End of Session Equipment Utilized During Treatment: Gait belt Activity Tolerance: Patient limited by fatigue Patient left: in chair;with call bell/phone within reach;with chair alarm set Nurse Communication: Mobility status PT Visit Diagnosis: Muscle weakness (generalized) (M62.81)     Time: 1102-1130 PT Time Calculation (min) (ACUTE ONLY): 28 min  Charges:    $Gait Training: 8-22 mins $Therapeutic Exercise: 8-22 mins PT General Charges $$ ACUTE PT VISIT: 1 Visit                     Samary Shatz M,PT Acute Rehab Services [phone]    Stephane JULIANNA Bevel 10/11/2023, 12:31 PM

## 2023-10-11 NOTE — Progress Notes (Signed)
 Rounding Note   Patient Name: Jacqueline Hancock Date of Encounter: 10/11/2023  [facility] Cardiologist: Jacqueline LITTIE [name], MD   Subjective Extubated 7/5, now on room air.  UOP 1.875 yesterday, Improving creatinine.   Scheduled Meds:  amoxicillin -clavulanate  1 tablet Oral Q12H   aspirin   81 mg Oral Daily   Chlorhexidine  Gluconate Cloth  6 each Topical Daily   furosemide   80 mg Intravenous Daily   sodium chloride  flush  10-40 mL Intracatheter Q12H   Continuous Infusions:   PRN Meds: acetaminophen , albuterol , docusate, guaiFENesin -dextromethorphan , ondansetron  (ZOFRAN ) IV, polyethylene glycol, sodium chloride  flush   Vital Signs  Vitals:   10/10/23 2309 10/11/23 0322 10/11/23 0500 10/11/23 0805  BP: 122/73 127/68  123/61  Pulse: 81  88 100  Resp: 16 18 19  (!) 24  Temp: 98 F (36.7 C) 97.6 F (36.4 C)  97.7 F (36.5 C)  TempSrc: Oral Oral  Oral  SpO2: 95% 97% 97% 95%  Weight:   59.5 kg   Height:        Intake/Output Summary (Last 24 hours) at 10/11/2023 0851 Last data filed at 10/11/2023 0244 Gross per 24 hour  Intake 360 ml  Output 1650 ml  Net -1290 ml      10/11/2023    5:00 AM 10/10/2023    5:51 AM 10/09/2023    4:20 AM  Last 3 Weights  Weight (lbs) 131 lb 2.8 oz 129 lb 10.1 oz 129 lb 13.6 oz  Weight (kg) 59.5 kg 58.8 kg 58.9 kg      Telemetry NSR, ST - Personally Reviewed  ECG  Sinus tach 7/4- Personally Reviewed  Physical Exam GEN: alert Neck:  JVD to lower 1/3 of neck at 60 deg Cardiac: RRR, no murmurs Respiratory: grossly clear  GI: Soft, nontender MS: 1+ LE edema, warm Neuro:  nonfocal Psych: normal mood and affect  Labs High Sensitivity Troponin:   Recent Labs  Lab 10/06/23 0416 10/06/23 0615 10/06/23 0816 10/06/23 1059  TROPONINIHS 63* 343* 1,163* 1,186*     Chemistry Recent Labs  Lab 10/04/23 1100 10/06/23 0416 10/06/23 0605 10/08/23 2113 10/09/23 0500 10/10/23 1000 10/11/23 0325  NA 141 139   < >  --  141 136 134*   K 3.6 3.7   < >  --  4.4 3.8 3.5  CL 103 105   < >  --  102 98 99  CO2 29 20*   < >  --  25 26 25   GLUCOSE 103* 124*   < >  --  101* 123* 103*  BUN 24* 29*   < >  --  36* 48* 41*  CREATININE 1.61* 1.63*   < >  --  1.98* 1.71* 1.67*  CALCIUM 8.5* 8.4*   < >  --  8.8* 8.8* 8.2*  MG  --   --    < > 2.0  --  1.9 1.9  PROT 5.8* 5.7*  --   --   --   --   --   ALBUMIN  3.4* 2.9*  --   --   --   --   --   AST 16 39  --   --   --   --   --   ALT 11 23  --   --   --   --   --   ALKPHOS 109 108  --   --   --   --   --   BILITOT 0.7 1.3*  --   --   --   --   --  GFRNONAA 32* 31*   < >  --  25* 29* 30*  ANIONGAP 9 14   < >  --  14 12 10    < > = values in this interval not displayed.    Lipids  Recent Labs  Lab 10/08/23 0500  CHOL 165  TRIG 147  HDL 34*  LDLCALC 102*  CHOLHDL 4.9    Hematology Recent Labs  Lab 10/09/23 0500 10/10/23 0457 10/11/23 0325  WBC 6.6 6.0 4.9  RBC 3.07* 3.20* 3.15*  HGB 8.7* 9.1* 9.0*  HCT 27.6* 28.6* 28.0*  MCV 89.9 89.4 88.9  MCH 28.3 28.4 28.6  MCHC 31.5 31.8 32.1  RDW 15.4 14.7 14.5  PLT 157 172 166   Thyroid  No results for input(s): TSH, FREET4 in the last 168 hours.  BNP Recent Labs  Lab 10/06/23 0416  BNP 1,289.3*    DDimer No results for input(s): DDIMER in the last 168 hours.   Radiology  No results found.   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  She was intubated due to hypoxic respiratory failure.   Assessment & Plan  NSTEMI: Presented with chest pain and SOB, plan to be in acute hypoxic respiratory failure and was intubated on 7/4.  EKG showed sinus tachycardia, no ischemia. Hs troponin elevated, 63>>343>>1186.  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  -Complicated situation. She is on chemotherapy and has had recent anemia with Hgb down to 6.2 on 6/19.  She was started on  heparin  drip and aspirin  but became progressively anemic, heparin  gtt stopped.  Received 1 unit PRBCs for goal hemoglobin < 8.  With her anemia she is not a candidate for cardiac catheterization at this time. - no cp or sob today. Tolerating diuresis. Continue lasix  80 mg IV daily today. Continued improvement. Can consider tx to oral lasix  tomorrow.  Acute hypoxic respiratory failure secondary to flash pulmonary edema: Intubated on 7/4, presented with chest pains and SOB. Echo from May 2025 showed normal LVEF, normal RV function, mild to moderate MR, trivial AI and CVP 3 mmHg.  Limited echocardiogram obtained 7/4 showed LVEF 25 to 30% with RWMA.  Imaging evidence of flash pulmonary edema/pulmonary vascular congestion. BNP elevated, 1289.  Serum creatinine increased from 1.6 in the ER to 2.07 upon arrival to the ICU.   -She was started on IV Lasix  drip at 12 mg/h in ICU.  Good diuresis, switched to IV Lasix  80 mg daily on 7/5.  She was extubated 7/5, now on room air - Diuresed well, Cr improving, continue lasix  IV 80 mg daily today .   Acute systolic heart failure: LVEF 25-30%.  Could be cardiotoxicity from chemo vs ischemia. -Diuresis as above -Will add GDMT as able, limited by renal function.     AKI: Plan as above, monitor with diuresis, improvement.    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    For questions or updates, please contact [facility] Please consult [email or web address] for contact info under     Signed, [name], MD  10/11/2023, 8:51 AM

## 2023-10-11 NOTE — Telephone Encounter (Signed)
 Call returned to [facility], spoke with receptionist and order from 10/04/23 needs to be signed and faxed. I seen order scanned under media and for some reason it was not signed by a provider.   I asked receptionist if another provider could sign and she said  Yes, as long as you provide their NPI number as well.  Form printed from media and placed on Jacqueline K. Caro, NP desk for review and signature. Once signed, Museum/gallery curator will fax

## 2023-10-11 NOTE — Telephone Encounter (Signed)
 Copied from CRM [id number]. Topic: Clinical - Order For Equipment >> Oct 10, 2023  1:05 PM Alfonso ORN wrote: Reason for CRM: Mindy - [facility]  [employer] [phone] ext. [phone]  wound care dressing  sent an order on 09/2023 wound care non sterile 4x4 's again on 10/04/23 sent over new order for alginate 4 x4 size  for addtional wound care dressing ,  recieved a note from [facility] that the order from 10/04/23 was already sent  believed this to be in error there are 2 orders the first was on 09/22/23 may be the order may be referring to that was already sent  If have office have a copy of the fax from 10/04/23 or [facility] [employer] can refax

## 2023-10-11 NOTE — Progress Notes (Signed)
 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  In the emergency room, she was hypotensive and tachycardic.  CT angiogram was negative for PE but consistent with bilateral pulmonary edema.  Initially started on BiPAP with improvement of work of breathing, however became more somnolent so was intubated for hypercapnia.  Patient was treated for flash pulmonary edema, found to have acute congestive heart failure.  Ultimately extubated and transferred out of ICU. 7/4, hypercapnic and somnolent.  Intubated and admitted to ICU. 7/5, extubated and was ultimately on room air.  Out of ICU. Found to have acute congestive heart failure and followed by cardiology.  Subjective: Patient seen and examined.  Son at the bedside.  Currently denies any complaints.  No other overnight events.  Anticipating going home soon.  Remains on IV Lasix .  Assessment & Plan:   Acute hypoxemic and hypercapnic respiratory failure due to volume overload. secondary to non-STEMI causing acute systolic heart failure with flash pulmonary edema: Intubated for airway protection, now extubated to room air.  Continue aggressive chest physiotherapy. Also noted to have leukocytosis and high procalcitonin, treated with antibiotics, she completed 4 days of vancomycin  cefepime  and azithromycin .  Will continue Augmentin  to complete total 7 days of antibiotics. Cultures are negative.   Non-STEMI Acute congestive heart failure, ejection fraction 25% with plus pulmonary edema Patient currently remains on heparin  infusion. Started on aspirin .  Treated with IV Lasix   infusion and now on IV Lasix .   Clinically improving. Potassium adequate. Echocardiogram with ejection fraction 25%, regional wall motion abnormality. Patient with chemotherapy, anemia so cardiology is recommending against cardiac catheterization at this time. Anticipate conservative management with follow-up.  Anemia, acute on chronic.  Large B-cell lymphoma with histological transformation: Patient is on Imbruvica .  Received 1 unit of PRBC with appropriate response.  AKI on CKD stage IIIb: At about baseline.  Close monitoring on dialysis.  Hypertension with hypertensive urgency on admission: Not on medications at home.  Monitoring blood pressure.  Currently stabilizing.  Medically stabilizing.  Continue to mobilize.  Discharge to home when cleared by cardiology.   DVT prophylaxis: Place TED hose Start: 10/09/23 1359 Place and maintain sequential compression device Start: 10/08/23 0753   Code Status: Full code Family Communication: Son at the bedside Disposition Plan: Status is: Inpatient Remains inpatient appropriate because: IV diuresis.       Consultants:  Cardiology Critical care  Procedures:  None  Antimicrobials:  Vancomycin , cefepime  and azithromycin  7/4--- 7/8 Augmentin  7/8---     Objective: Vitals:   10/11/23 0322 10/11/23 0500 10/11/23 0805 10/11/23 1101  BP: 127/68  123/61 136/78  Pulse:  88 100 94  Resp: 18 19 (!) 24 17  Temp: 97.6 F (36.4 C)  97.7 F (36.5 C) 98.5 F (36.9 C)  TempSrc: Oral  Oral Oral  SpO2: 97% 97% 95% 96%  Weight:  59.5 kg    Height:        Intake/Output Summary (Last 24 hours) at 10/11/2023 1136 Last data filed at 10/11/2023 1100 Gross per 24 hour  Intake 360 ml  Output 1700 ml  Net -1340 ml   Filed Weights   10/09/23  9579 10/10/23 0551 10/11/23 0500  Weight: 58.9 kg 58.8 kg 59.5 kg    Examination:  General exam: Looks comfortable.  Pleasant and interactive. Respiratory system: Mostly clear.  On room  air. Cardiovascular system: S1 & S2 heard, RRR.  No edema.  Wearing compression socks. Right IJ Port-A-Cath present. Gastrointestinal system: Soft.  Nontender.  Bowel sound present. Central nervous system: Alert and oriented. No focal neurological deficits.  Data Reviewed: I have personally reviewed following labs and imaging studies  CBC: Recent Labs  Lab 10/07/23 0144 10/08/23 0500 10/09/23 0500 10/10/23 0457 10/11/23 0325  WBC 14.6* 7.7 6.6 6.0 4.9  HGB 8.0* 7.0* 8.7* 9.1* 9.0*  HCT 25.3* 22.0* 27.6* 28.6* 28.0*  MCV 89.7 88.7 89.9 89.4 88.9  PLT 147* 148* 157 172 166   Basic Metabolic Panel: Recent Labs  Lab 10/07/23 0144 10/08/23 0500 10/08/23 2113 10/09/23 0500 10/10/23 1000 10/11/23 0325  NA 139 138  --  141 136 134*  K 4.0 3.2*  --  4.4 3.8 3.5  CL 104 99  --  102 98 99  CO2 20* 25  --  25 26 25   GLUCOSE 99 101*  --  101* 123* 103*  BUN 33* 35*  --  36* 48* 41*  CREATININE 2.03* 2.07*  --  1.98* 1.71* 1.67*  CALCIUM 8.2* 8.4*  --  8.8* 8.8* 8.2*  MG 1.5* 2.2 2.0  --  1.9 1.9  PHOS 5.0*  --   --   --   --   --    GFR: Estimated Creatinine Clearance: 20 mL/min (A) (by C-G formula based on SCr of 1.67 mg/dL (H)). Liver Function Tests: Recent Labs  Lab 10/06/23 0416  AST 39  ALT 23  ALKPHOS 108  BILITOT 1.3*  PROT 5.7*  ALBUMIN  2.9*   No results for input(s): LIPASE, AMYLASE in the last 168 hours. No results for input(s): AMMONIA in the last 168 hours. Coagulation Profile: Recent Labs  Lab 10/06/23 0416  INR 1.2   Cardiac Enzymes: No results for input(s): CKTOTAL, CKMB, CKMBINDEX, TROPONINI in the last 168 hours. BNP (last 3 results) Recent Labs    02/21/23 1206  PROBNP 14.0   HbA1C: No results for input(s): HGBA1C in the last 72 hours. CBG: Recent Labs  Lab 10/06/23 2326 10/07/23 0339 10/07/23 0753 10/07/23 1121 10/07/23 1554  GLUCAP 102* 102* 108* 105* 95   Lipid Profile: No results for input(s): CHOL, HDL,  LDLCALC, TRIG, CHOLHDL, LDLDIRECT in the last 72 hours.  Thyroid  Function Tests: No results for input(s): TSH, T4TOTAL, FREET4, T3FREE, THYROIDAB in the last 72 hours. Anemia Panel: No results for input(s): VITAMINB12, FOLATE, FERRITIN, TIBC, IRON, RETICCTPCT in the last 72 hours. Sepsis Labs: Recent Labs  Lab 10/06/23 0542 10/06/23 0816 10/06/23 0847 10/06/23 0950 10/06/23 1059  PROCALCITON  --  7.35  --   --   --   LATICACIDVEN 2.7*  --  2.0* 2.3* 2.0*    Recent Results (from the past 240 hours)  Culture, Urine     Status: None   Collection Time: 10/04/23  6:12 PM   Specimen: Urine, Clean Catch  Result Value Ref Range Status   Specimen Description   Final    URINE, CLEAN CATCH Performed at [facility], [address] [place], [place] [address]    Special Requests   Final    NONE Performed at [facility], [address], [place], [place] [address]    Culture  Final    NO GROWTH Performed at [facility], [address] [place], [place] [address]    Report Status 10/05/2023 FINAL  Final  Culture, blood (Routine X 2) w Reflex to ID Panel     Status: None   Collection Time: 10/06/23  5:35 AM   Specimen: BLOOD  Result Value Ref Range Status   Specimen Description BLOOD RIGHT ANTECUBITAL  Final   Special Requests   Final    BOTTLES DRAWN AEROBIC AND ANAEROBIC Blood Culture results may not be optimal due to an inadequate volume of blood received in culture bottles   Culture   Final    NO GROWTH 5 DAYS Performed at [facility], [address] [place], [place] [address]    Report Status 10/11/2023 FINAL  Final  Resp panel by RT-PCR (RSV, Flu A&B, Covid) Anterior Nasal Swab     Status: None   Collection Time: 10/06/23  5:50 AM   Specimen: Anterior Nasal Swab  Result Value Ref Range Status   SARS Coronavirus 2 by RT PCR NEGATIVE NEGATIVE Final   Influenza A by PCR NEGATIVE NEGATIVE  Final   Influenza B by PCR NEGATIVE NEGATIVE Final    Comment: (NOTE) The Xpert Xpress SARS-CoV-2/FLU/RSV plus assay is intended as an aid in the diagnosis of influenza from Nasopharyngeal swab specimens and should not be used as a sole basis for treatment. Nasal washings and aspirates are unacceptable for Xpert Xpress SARS-CoV-2/FLU/RSV testing.  Fact Sheet for Patients: BloggerCourse.com  Fact Sheet for Healthcare Providers: SeriousBroker.it  This test is not yet approved or cleared by the [place]  FDA and has been authorized for detection and/or diagnosis of SARS-CoV-2 by FDA under an Emergency Use Authorization (EUA). This EUA will remain in effect (meaning this test can be used) for the duration of the COVID-19 declaration under Section 564(b)(1) of the Act, 21 U.S.C. section 360bbb-3(b)(1), unless the authorization is terminated or revoked.     Resp Syncytial Virus by PCR NEGATIVE NEGATIVE Final    Comment: (NOTE) Fact Sheet for Patients: BloggerCourse.com  Fact Sheet for Healthcare Providers: SeriousBroker.it  This test is not yet approved or cleared by the [place]  FDA and has been authorized for detection and/or diagnosis of SARS-CoV-2 by FDA under an Emergency Use Authorization (EUA). This EUA will remain in effect (meaning this test can be used) for the duration of the COVID-19 declaration under Section 564(b)(1) of the Act, 21 U.S.C. section 360bbb-3(b)(1), unless the authorization is terminated or revoked.  Performed at [facility], [address] [place], [place] [address]   Culture, blood (Routine X 2) w Reflex to ID Panel     Status: None   Collection Time: 10/06/23  6:15 AM   Specimen: BLOOD RIGHT HAND  Result Value Ref Range Status   Specimen Description BLOOD RIGHT HAND  Final   Special Requests   Final    BOTTLES DRAWN AEROBIC AND  ANAEROBIC Blood Culture adequate volume   Culture   Final    NO GROWTH 5 DAYS Performed at [facility], [address], [place], [place] [address]    Report Status 10/11/2023 FINAL  Final  MRSA Next Gen by PCR, Nasal     Status: Abnormal   Collection Time: 10/06/23  8:08 AM   Specimen: Urine, Catheterized; Nasal Swab  Result Value Ref Range Status   MRSA by PCR Next Gen DETECTED (A) NOT DETECTED Final    Comment: RESULT  CALLED TO, READ BACK BY AND VERIFIED WITH: RN ZAHRA [id number] AT 1101 BY EC (NOTE) The GeneXpert MRSA Assay (FDA approved for NASAL specimens only), is one component of a comprehensive MRSA colonization surveillance program. It is not intended to diagnose MRSA infection nor to guide or monitor treatment for MRSA infections. Test performance is not FDA approved in patients less than 51 years old. Performed at [facility], [address], [place], [place] [address]          Radiology Studies: No results found.       Scheduled Meds:  aspirin   81 mg Oral Daily   Chlorhexidine  Gluconate Cloth  6 each Topical Daily   furosemide   80 mg Intravenous Daily   sodium chloride  flush  10-40 mL Intracatheter Q12H   Continuous Infusions:     LOS: 5 days    Time spent: 40 minutes    Renato Applebaum, MD [facility]

## 2023-10-11 NOTE — Telephone Encounter (Signed)
Signed and placed to be faxed

## 2023-10-11 NOTE — Progress Notes (Signed)
   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   Presented to the ED on 7/4 with chest pain, shortness of breath, diaphoresis, and lightheadedness. Severely hypertensive and tachycardic on arrival to the ED. CTA negative for PE. Hs troponin elevated, 63>>343>>1186.  BNP elevated 1289. Intubated for respiratory failure secondary to flash pulmonary edema after failing BiPAP. Started on lasix  drip. ECHO on 7/4 with LVEF 25-30% (was 60-65% in 08/2023), both global and RWMA, entire lateral wall, anterior septum, and basal inferior segment are hypokinetic. Cardiology was consulted. Extubated on 7/5. Cath limited at this time due to anemia requiring blood transfusion.   States she is feeling well today. States her breathing and orthopnea is improving. Up sitting in the chair. TED hose in place. Still with edema in the thighs. Has not been up working with PT much per patient report. Discussed goals of medication therapy. No questions or concerns at this time.   Current HF Medications: Diuretic: furosemide  80 mg IV daily  Prior to admission HF Medications: Diuretic: torsemide  20 mg daily PRN  Pertinent Lab Values: Serum creatinine 1.67, BUN 41, Potassium 3.5, Sodium 134, BNP 1289.3, Magnesium  1.9  Vital Signs: Weight: 129 lbs (admission weight: 146 lbs) Blood pressure: 120/60s  Heart rate: 90-100s  I/O: net -1.4L yesterday; net -6.5L since admission  Medication Assistance / Insurance Benefits Check: Does the patient have prescription insurance?  Yes Type of insurance plan: [facility] Medicare  Outpatient Pharmacy:  Prior to admission outpatient pharmacy: [facility] Is the patient willing to use [facility] TOC pharmacy at discharge? Yes Is the patient willing to transition their outpatient  pharmacy to utilize a [facility] outpatient pharmacy?   No    Assessment: 1. Acute systolic CHF (LVEF 25-30%), due to cardiotoxicity vs ischemia. NYHA class III symptoms. - Continue furosemide  80 mg IV daily. TED hose in place. Strict I/Os and daily weights - Consider starting BB once euvolemic - Consider adding spironolactone 12.5 mg daily   Plan: 1) Medication changes recommended at this time: - Add spironolactone 12.5 mg daily  2) Patient assistance: - $100.59 left on deductible - Entresto/Farxiga/Jardiance copay - First fill $147.59, then $47 per month going forward. - Patient states this may be difficult to afford - likely qualifies for grant for copay assistance  3)  Education - Initial education completed - Full education to be completed prior to discharge  Duwaine Plant, PharmD, BCPS Heart Failure Stewardship Pharmacist Phone [phone]

## 2023-10-11 NOTE — TOC Progression Note (Signed)
 Transition of Care [facility]) - Progression Note    Patient Details  Name: Jacqueline Hancock MRN: [id number] Date of Birth: [birth year]  Transition of Care [facility]) CM/SW Contact  Roxie KANDICE Stain, RN Phone Number: 10/11/2023, 3:10 PM  Clinical Narrative:    PT recommended rolllator. Patient is agreeable.  Notified Zack with [facility] of rollator order.     Expected Discharge Plan: Home w Home Health Services Barriers to Discharge: Continued Medical Work up  Expected Discharge Plan and Services   Discharge Planning Services: CM Consult Post Acute Care Choice: Home Health Living arrangements for the past 2 months: Single Family Home                   DME Agency: NA       HH Arranged: PT, OT, Nurse's Aide HH Agency:  [employer]) Date HH Agency Contacted: 10/09/23 Time HH Agency Contacted: 1651 Representative spoke with at [facility] Agency: Jon   Social Determinants of Health (SDOH) Interventions SDOH Screenings   Food Insecurity: No Food Insecurity (10/06/2023)  Housing: Unknown (10/11/2023)  Transportation Needs: No Transportation Needs (10/11/2023)  Utilities: Not At Risk (10/06/2023)  Recent Concern: Utilities - At Risk (08/02/2023)  Alcohol Screen: Low Risk  (10/11/2023)  Depression (PHQ2-9): Low Risk  (10/04/2023)  Financial Resource Strain: Low Risk  (10/11/2023)  Social Connections: Moderately Integrated (10/06/2023)  Tobacco Use: Low Risk  (10/07/2023)  Health Literacy: Adequate Health Literacy (02/16/2023)    Readmission Risk Interventions    10/09/2023    4:49 PM 08/30/2023   11:03 AM 08/02/2023   11:12 AM  Readmission Risk Prevention Plan  Transportation Screening Complete Complete Complete  PCP or Specialist Appt within 5-7 Days   Complete  Home Care Screening   Complete  Medication Review (RN CM)   Complete  Medication Review Oceanographer) Complete Complete   PCP or Specialist appointment within 3-5 days of discharge  Complete   HRI or Home Care Consult Complete Complete   SW  Recovery Care/Counseling Consult  Complete   Palliative Care Screening Not Applicable Complete   Skilled Nursing Facility Not Applicable Complete

## 2023-10-11 NOTE — Plan of Care (Signed)
  Problem: Clinical Measurements: Goal: Ability to maintain clinical measurements within normal limits will improve Outcome: Progressing Goal: Will remain free from infection Outcome: Progressing Goal: Respiratory complications will improve Outcome: Progressing   Problem: Activity: Goal: Risk for activity intolerance will decrease Outcome: Progressing   Problem: Pain Managment: Goal: General experience of comfort will improve and/or be controlled Outcome: Progressing   Problem: Safety: Goal: Ability to remain free from injury will improve Outcome: Progressing

## 2023-10-11 NOTE — Progress Notes (Signed)
 Occupational Therapy Treatment Patient Details Name: Jacqueline Hancock MRN: [id number] DOB: 02/27/1941 Today's Date: 10/11/2023   History of present illness 83 year old female admitted to ICU on 7/4 with sudden onset of shortness of breath where she woke up from sleep with extreme shortness of breath.  In the emergency room, she was hypotensive and tachycardic.  CT angiogram was negative for PE but consistent with bilateral pulmonary edema.  Initially started on BiPAP with improvement of work of breathing, however became more somnolent so was intubated for hypercapnia. Found to have acute resp failure due to NSTEMI.   Patient was treated for flash pulmonary edema, found to have acute congestive heart failure.  Ultimately extubated 7/5 and transferred out of ICU. 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 Patient able to get to EOB with min assist to scoot to EOB and CGA to transfer to recliner with RW. Patient was provided with printed HEP and level one therapy band. Patient performed UE strengthening exercises with min verbal cues. Discharge recommendations continue to be appropriate. Acute OT to continue to follow to address established goals.       If plan is discharge home, recommend the following:  A little help with walking and/or transfers;A little help with bathing/dressing/bathroom;Assistance with cooking/housework;Assist for transportation;Help with stairs or ramp for entrance   Equipment Recommendations  Other (comment) (rollator)    Recommendations for Other Services      Precautions / Restrictions Precautions Precautions: Fall Restrictions Weight Bearing Restrictions Per Provider Order: No       Mobility Bed Mobility Overal bed mobility: Needs Assistance Bed Mobility: Supine to Sit     Supine to sit: Min  assist     General bed mobility comments: able to get to EOB without min assisst to scoot to EOB    Transfers Overall transfer level: Needs assistance Equipment used: Rolling walker (2 wheels) Transfers: Sit to/from Stand, Bed to chair/wheelchair/BSC Sit to Stand: Contact guard assist           General transfer comment: CGA for safety     Balance Overall balance assessment: Needs assistance Sitting-balance support: No upper extremity supported, Feet supported Sitting balance-Leahy Scale: Fair     Standing balance support: Bilateral upper extremity supported, During functional activity Standing balance-Leahy Scale: Poor Standing balance comment: reliant on RW for support                           ADL either performed or assessed with clinical judgement   ADL Overall ADL's : Needs assistance/impaired     Grooming: Contact guard assist;Standing           Upper Body Dressing : Set up;Sitting Upper Body Dressing Details (indicate cue type and reason): gown for back Lower Body Dressing: Moderate assistance;Minimal assistance;With adaptive equipment;Sitting/lateral leans;Sit to/from stand Lower Body Dressing Details (indicate cue type and reason): max assist to donn compression stockings                    Extremity/Trunk Assessment Upper Extremity Assessment Upper Extremity Assessment: Generalized weakness            Vision       Perception     Praxis     Communication Communication Communication: No apparent difficulties   Cognition Arousal: Alert Behavior During Therapy: [facility] for  tasks assessed/performed Cognition: No apparent impairments                               Following commands: Intact        Cueing   Cueing Techniques: Verbal cues  Exercises Exercises: General Upper Extremity General Exercises - Upper Extremity Shoulder Flexion: AROM, Both, 15 reps, Seated Shoulder Horizontal ABduction: Strengthening,  Both, 15 reps, Seated, Theraband Theraband Level (Shoulder Horizontal Abduction): Level 1 (Yellow) Elbow Flexion: Strengthening, Both, 15 reps, Seated, Theraband Theraband Level (Elbow Flexion): Level 1 (Yellow) Elbow Extension: Strengthening, Both, 15 reps, Seated, Theraband Theraband Level (Elbow Extension): Level 1 (Yellow)    Shoulder Instructions       General Comments Patient's son present    Pertinent Vitals/ Pain       Pain Assessment Pain Assessment: No/denies pain Pain Intervention(s): Monitored during session  Home Living                                          Prior Functioning/Environment              Frequency  Min 2X/week        Progress Toward Goals  OT Goals(current goals can now be found in the care plan section)  Progress towards OT goals: Progressing toward goals  Acute Rehab OT Goals Patient Stated Goal: to get stronger OT Goal Formulation: With patient Time For Goal Achievement: 10/23/23 Potential to Achieve Goals: Good ADL Goals Pt Will Perform Lower Body Bathing: with adaptive equipment;sit to/from stand;with supervision Pt Will Perform Lower Body Dressing: with supervision;with adaptive equipment;sitting/lateral leans;sit to/from stand Pt Will Transfer to Toilet: with modified independence;ambulating Pt/caregiver will Perform Home Exercise Program: Increased strength;Both right and left upper extremity;With theraband;Independently;With written HEP provided Additional ADL Goal #1: Pt to verbalize at least 3 energy conservation strategies to implement in home environment.  Plan      Co-evaluation                 AM-PAC OT 6 Clicks Daily Activity     Outcome Measure   Help from another person eating meals?: None Help from another person taking care of personal grooming?: A Little Help from another person toileting, which includes using toliet, bedpan, or urinal?: A Little Help from another person bathing  (including washing, rinsing, drying)?: A Lot Help from another person to put on and taking off regular upper body clothing?: A Little Help from another person to put on and taking off regular lower body clothing?: A Lot 6 Click Score: 17    End of Session Equipment Utilized During Treatment: Rolling walker (2 wheels)  OT Visit Diagnosis: Unsteadiness on feet (R26.81);Other abnormalities of gait and mobility (R26.89);Muscle weakness (generalized) (M62.81)   Activity Tolerance Patient tolerated treatment well   Patient Left in chair;with call bell/phone within reach;with family/visitor present   Nurse Communication Mobility status        Time: 9157-9085 OT Time Calculation (min): 32 min  Charges: OT General Charges $OT Visit: 1 Visit OT Treatments $Self Care/Home Management : 8-22 mins $Therapeutic Exercise: 8-22 mins  Dick Laine, OTA Acute Rehabilitation Services  Office [phone]   Jeb LITTIE Laine 10/11/2023, 11:35 AM

## 2023-10-12 ENCOUNTER — Other Ambulatory Visit (HOSPITAL_COMMUNITY): Payer: Self-pay

## 2023-10-12 ENCOUNTER — Encounter: Payer: Self-pay | Admitting: Hematology

## 2023-10-12 DIAGNOSIS — I214 Non-ST elevation (NSTEMI) myocardial infarction: Secondary | ICD-10-CM | POA: Diagnosis not present

## 2023-10-12 DIAGNOSIS — I5021 Acute systolic (congestive) heart failure: Secondary | ICD-10-CM | POA: Diagnosis not present

## 2023-10-12 DIAGNOSIS — J9602 Acute respiratory failure with hypercapnia: Secondary | ICD-10-CM | POA: Diagnosis not present

## 2023-10-12 LAB — TYPE AND SCREEN
ABO/RH(D): B POS
Antibody Screen: NEGATIVE
Unit division: 0
Unit division: 0

## 2023-10-12 LAB — BPAM RBC
Blood Product Expiration Date: 202508022359
Blood Product Expiration Date: 202508142359
ISSUE DATE / TIME: 202507061551
Unit Type and Rh: 5100
Unit Type and Rh: 7300

## 2023-10-12 LAB — BASIC METABOLIC PANEL WITH GFR
Anion gap: 12 (ref 5–15)
BUN: 42 mg/dL — ABNORMAL HIGH (ref 8–23)
CO2: 27 mmol/L (ref 22–32)
Calcium: 8.5 mg/dL — ABNORMAL LOW (ref 8.9–10.3)
Chloride: 96 mmol/L — ABNORMAL LOW (ref 98–111)
Creatinine, Ser: 1.74 mg/dL — ABNORMAL HIGH (ref 0.44–1.00)
GFR, Estimated: 29 mL/min — ABNORMAL LOW (ref >60)
Glucose, Bld: 92 mg/dL (ref 70–99)
Potassium: 3.5 mmol/L (ref 3.5–5.1)
Sodium: 135 mmol/L (ref 135–145)

## 2023-10-12 LAB — CBC
HCT: 25.8 % — ABNORMAL LOW (ref 36.0–46.0)
Hemoglobin: 8.2 g/dL — ABNORMAL LOW (ref 12.0–15.0)
MCH: 29 pg (ref 26.0–34.0)
MCHC: 31.8 g/dL (ref 30.0–36.0)
MCV: 91.2 fL (ref 80.0–100.0)
Platelets: 158 K/uL (ref 150–400)
RBC: 2.83 MIL/uL — ABNORMAL LOW (ref 3.87–5.11)
RDW: 13.9 % (ref 11.5–15.5)
WBC: 2.4 K/uL — ABNORMAL LOW (ref 4.0–10.5)
nRBC: 0 % (ref 0.0–0.2)

## 2023-10-12 LAB — MAGNESIUM: Magnesium: 1.8 mg/dL (ref 1.7–2.4)

## 2023-10-12 MED ORDER — HEPARIN SOD (PORK) LOCK FLUSH 100 UNIT/ML IV SOLN
500.0000 [IU] | INTRAVENOUS | Status: AC | PRN
  Administered 2023-10-12: 500 [IU]

## 2023-10-12 MED ORDER — ASPIRIN 81 MG PO CHEW
81.0000 mg | CHEWABLE_TABLET | Freq: Every day | ORAL | 0 refills | Status: DC
  Filled 2023-10-12: qty 90, 90d supply, fill #0

## 2023-10-12 NOTE — Progress Notes (Signed)
 Rounding Note   Patient Name: Jacqueline Hancock Date of Encounter: 10/12/2023  [facility] Cardiologist: Lonni LITTIE [name], MD   Subjective Extubated 7/5, now on room air.  UOP 1.9 L.   Scheduled Meds:  aspirin   81 mg Oral Daily   Chlorhexidine  Gluconate Cloth  6 each Topical Daily   sodium chloride  flush  10-40 mL Intracatheter Q12H   Continuous Infusions:   PRN Meds: acetaminophen , albuterol , docusate, guaiFENesin -dextromethorphan , ondansetron  (ZOFRAN ) IV, polyethylene glycol, sodium chloride  flush   Vital Signs  Vitals:   10/12/23 0326 10/12/23 0500 10/12/23 0724 10/12/23 1118  BP: 133/67  138/87 130/76  Pulse: 96 89 95 92  Resp: 19 20 (!) 25 (!) 23  Temp: 98.1 F (36.7 C)  98 F (36.7 C) 98 F (36.7 C)  TempSrc: Oral  Oral Oral  SpO2: 97% 96% 97% 98%  Weight:  57.9 kg    Height:        Intake/Output Summary (Last 24 hours) at 10/12/2023 1352 Last data filed at 10/12/2023 0300 Gross per 24 hour  Intake 120 ml  Output 1350 ml  Net -1230 ml      10/12/2023    5:00 AM 10/11/2023    5:00 AM 10/10/2023    5:51 AM  Last 3 Weights  Weight (lbs) 127 lb 10.3 oz 131 lb 2.8 oz 129 lb 10.1 oz  Weight (kg) 57.9 kg 59.5 kg 58.8 kg      Telemetry NSR, ST - Personally Reviewed  ECG  Sinus tach 7/4- Personally Reviewed  Physical Exam GEN: alert Neck:  no JVD Cardiac: RRR, no murmurs Respiratory: grossly clear  GI: Soft, nontender MS: no LE edema, warm Neuro:  nonfocal Psych: normal mood and affect  Labs High Sensitivity Troponin:   Recent Labs  Lab 10/06/23 0416 10/06/23 0615 10/06/23 0816 10/06/23 1059  TROPONINIHS 63* 343* 1,163* 1,186*     Chemistry Recent Labs  Lab 10/06/23 0416 10/06/23 0605 10/10/23 1000 10/11/23 0325 10/12/23 0325  NA 139   < > 136 134* 135  K 3.7   < > 3.8 3.5 3.5  CL 105   < > 98 99 96*  CO2 20*   < > 26 25 27   GLUCOSE 124*   < > 123* 103* 92  BUN 29*   < > 48* 41* 42*  CREATININE 1.63*   < > 1.71* 1.67*  1.74*  CALCIUM 8.4*   < > 8.8* 8.2* 8.5*  MG  --    < > 1.9 1.9 1.8  PROT 5.7*  --   --   --   --   ALBUMIN  2.9*  --   --   --   --   AST 39  --   --   --   --   ALT 23  --   --   --   --   ALKPHOS 108  --   --   --   --   BILITOT 1.3*  --   --   --   --   GFRNONAA 31*   < > 29* 30* 29*  ANIONGAP 14   < > 12 10 12    < > = values in this interval not displayed.    Lipids  Recent Labs  Lab 10/08/23 0500  CHOL 165  TRIG 147  HDL 34*  LDLCALC 102*  CHOLHDL 4.9    Hematology Recent Labs  Lab 10/10/23 0457 10/11/23 0325 10/12/23 0325  WBC 6.0 4.9  2.4*  RBC 3.20* 3.15* 2.83*  HGB 9.1* 9.0* 8.2*  HCT 28.6* 28.0* 25.8*  MCV 89.4 88.9 91.2  MCH 28.4 28.6 29.0  MCHC 31.8 32.1 31.8  RDW 14.7 14.5 13.9  PLT 172 166 158   Thyroid  No results for input(s): TSH, FREET4 in the last 168 hours.  BNP Recent Labs  Lab 10/06/23 0416  BNP 1,289.3*    DDimer No results for input(s): DDIMER in the last 168 hours.   Radiology  No results found.   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  She was intubated due to hypoxic respiratory failure.   Assessment & Plan  NSTEMI: Presented with chest pain and SOB, plan to be in acute hypoxic respiratory failure and was intubated on 7/4.  EKG showed sinus tachycardia, no ischemia. Hs troponin elevated, 63>>343>>1186.  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  -Complicated situation. She is on chemotherapy and has had recent anemia with Hgb down to 6.2 on 6/19.  She was started on heparin  drip and aspirin  but became progressively anemic, heparin  gtt stopped.  Received 1 unit PRBCs for goal hemoglobin < 8.  With her anemia she is not a candidate for cardiac catheterization at this time. - no cp or sob today. Tolerated diuresis. Stop IV lasix  today and can use prn oral lasix  homegoing 40 mg.    Acute hypoxic respiratory failure secondary to flash pulmonary edema: Intubated on 7/4, presented with chest pains and SOB. Echo from May 2025 showed normal LVEF, normal RV function, mild to moderate MR, trivial AI and CVP 3 mmHg.  Limited echocardiogram obtained 7/4 showed LVEF 25 to 30% with RWMA.  Imaging evidence of flash pulmonary edema/pulmonary vascular congestion. BNP elevated, 1289.  Serum creatinine increased from 1.6 in the ER to 2.07 upon arrival to the ICU.   -She was started on IV Lasix  drip at 12 mg/h in ICU.  Good diuresis, switched to IV Lasix  80 mg daily on 7/5.  She was extubated 7/5, now on room air - Diuresed well, Cr now rising so likely dry. Stop IV lasix .   Acute systolic heart failure: LVEF 25-30%.  Could be cardiotoxicity from chemo vs ischemia. - can consider adding GDMT at close follow up Monday 7/14.    AKI: Plan as above, monitor with diuresis, improvement.    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    For questions or updates, please contact [facility] Please consult [email or web address] for contact info under     Signed, [name], MD  10/12/2023, 1:52 PM

## 2023-10-12 NOTE — Progress Notes (Signed)
 Went over AVS with patient/patient's son, IV port deaccessed by IV team, TOC meds given, patient belongings gathered and patient was wheeled out to sons vehicle.

## 2023-10-12 NOTE — TOC Transition Note (Signed)
 Transition of Care [facility]) - Discharge Note   Patient Details  Name: Jacqueline Hancock MRN: [id number] Date of Birth: 1940-10-05  [facility]) CM/SW Contact:  Roxie KANDICE Stain, RN Phone Number: 10/12/2023, 2:10 PM   Clinical Narrative:    Patient stable for discharge. Notified Jon with [facility] of discharge.  No other TOC needs at this time.    Final next level of care: Home w Home Health Services Barriers to Discharge: Barriers Resolved   Patient Goals and CMS Choice Patient states their goals for this hospitalization and ongoing recovery are:: return home, son is there with her all the time CMS Medicare.gov Compare Post Acute Care list provided to:: Patient Choice offered to / list presented to : Patient      Discharge Placement               Home        Discharge Plan and Services Additional resources added to the After Visit Summary for     Discharge Planning Services: CM Consult Post Acute Care Choice: Home Health          DME Arranged: Walker rolling with seat DME Agency: [employer] Date DME Agency Contacted: 10/11/23 Time DME Agency Contacted: 1511 Representative spoke with at DME Agency: ZACK HH Arranged: PT, OT, Nurse's Aide HH Agency:  [facility]) Date HH Agency Contacted: 10/09/23 Time HH Agency Contacted: 1651 Representative spoke with at [facility] Agency: Jon  Social Drivers of Health (SDOH) Interventions SDOH Screenings   Food Insecurity: No Food Insecurity (10/06/2023)  Housing: Unknown (10/11/2023)  Transportation Needs: No Transportation Needs (10/11/2023)  Utilities: Not At Risk (10/06/2023)  Recent Concern: Utilities - At Risk (08/02/2023)  Alcohol Screen: Low Risk  (10/11/2023)  Depression (PHQ2-9): Low Risk  (10/04/2023)  Financial Resource Strain: Low Risk  (10/11/2023)  Social Connections: Moderately Integrated (10/06/2023)  Tobacco Use: Low Risk  (10/07/2023)  Health Literacy: Adequate Health Literacy (02/16/2023)     Readmission Risk  Interventions    10/09/2023    4:49 PM 08/30/2023   11:03 AM 08/02/2023   11:12 AM  Readmission Risk Prevention Plan  Transportation Screening Complete Complete Complete  PCP or Specialist Appt within 5-7 Days   Complete  Home Care Screening   Complete  Medication Review (RN CM)   Complete  Medication Review Oceanographer) Complete Complete   PCP or Specialist appointment within 3-5 days of discharge  Complete   HRI or Home Care Consult Complete Complete   SW Recovery Care/Counseling Consult  Complete   Palliative Care Screening Not Applicable Complete   Skilled Nursing Facility Not Applicable Complete

## 2023-10-12 NOTE — Discharge Summary (Signed)
 Physician Discharge Summary  Jacqueline Hancock FMW:[id number] DOB: 22-Nov-1940 DOA: 10/06/2023  PCP: Leonarda Roxan BROCKS, NP  Admit date: 10/06/2023 Discharge date: 10/12/2023  Admitted From: Home Disposition: Home with home health  Recommendations for Outpatient Follow-up:  Follow up with PCP in 1-2 weeks Please obtain BMP/CBC in one week Cardiology to schedule follow-up Follow-up with oncology as previously scheduled  Home Health: None Equipment/Devices: Rollator  Discharge Condition: Stable CODE STATUS: Full code Diet recommendation: Low-salt diet  Discharge summary: 83 year old female with 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  In the emergency room, she was hypotensive and tachycardic.  CT angiogram was negative for PE but consistent with bilateral pulmonary edema.  Initially started on BiPAP with improvement of work of breathing, however became more somnolent so was intubated for hypercapnia.  Patient was treated for flash pulmonary edema, found to have acute congestive heart failure.  Ultimately extubated and transferred out of ICU. 7/4, hypercapnic and somnolent.  Intubated and admitted to ICU. 7/5, extubated and was ultimately on room air.  Out of ICU. Found to have acute congestive heart failure and followed by cardiology. Clinically stabilized.  Going home today.  Acute hypoxemic and hypercapnic respiratory failure secondary to volume overload improved.  On room air.  Mobilized around without any supplemental oxygen needed.  Non-STEMI, acute congestive heart failure, likely ischemic cardiomyopathy: Initially treated with heparin  infusion.  Now on aspirin .  Treated with IV drip of Lasix  and then subsequently IV Lasix .  Potassium is adequate.   Patient is euvolemic now.  Echocardiogram with ejection fraction 25%.  Also with regional wall motion abnormalities.  Would not undergo cardiac catheterization due to pancytopenia. With clinical improvement, going home on aspirin .  She will be on torsemide  as needed.  Patient could not tolerate GDMT due to low blood pressures and abnormal renal functions.  Cardiology to schedule outpatient follow-up.  Anemia, acute on chronic.  Large B-cell lymphoma with histological transformation: Patient is on Imbruvica .  Received 1 unit of PRBC with appropriate response.   AKI on CKD stage IIIb: At about baseline.  Recheck in 1 week.   Hypertension with hypertensive urgency on admission: Blood pressure adequate now.  Not needing any treatment.  Suspected pneumonia: Patient completed 7 days of antibiotic therapy.  Cultures were negative.  Her presentation is likely secondary to fluid overload.  Initial lymphoma: Currently on chemotherapy.  Follow-up at oncology as scheduled for further discussion.  Stable to discharge home with family.  Discharge Diagnoses:  Principal Problem:   Hypercapnic respiratory failure (HCC) Active Problems:   AKI (acute kidney injury) (HCC)   Non-ST elevation (NSTEMI) myocardial infarction (HCC)   Acute respiratory failure with hypoxia and hypercapnia (HCC)   Acute systolic heart failure (HCC)   Anemia    Discharge Instructions  Discharge Instructions     (HEART FAILURE PATIENTS) Call MD:  Anytime you have any of the following symptoms: 1) 3 pound weight gain in 24 hours or 5 pounds in 1 week 2) shortness of breath, with or without a dry hacking cough 3) swelling in the hands, feet or stomach 4) if you have to sleep on extra pillows at night in order to breathe.   Complete by: As directed    Diet general   Complete by: As directed  Increase activity slowly   Complete by: As directed    No wound care   Complete by: As directed       Allergies as of 10/12/2023        Reactions   Peanut Butter Flavoring Agent (non-screening) Itching   Nexium [esomeprazole Magnesium ] Other (See Comments)   Headache        Medication List     STOP taking these medications    entecavir  0.5 MG tablet Commonly known as: BARACLUDE    loratadine 10 MG tablet Commonly known as: CLARITIN   senna-docusate 8.6-50 MG tablet Commonly known as: Senokot-S   sucralfate  1 GM/10ML suspension Commonly known as: CARAFATE    VITAMIN B-COMPLEX PO       TAKE these medications    aspirin  81 MG chewable tablet Chew 1 tablet (81 mg total) by mouth daily. Start taking on: October 13, 2023   pantoprazole  40 MG tablet Commonly known as: PROTONIX  Take 1 tablet (40 mg total) by mouth 2 (two) times daily for 56 days, THEN 1 tablet (40 mg total) daily. Start taking on: September 05, 2023   polyethylene glycol 17 g packet Commonly known as: MIRALAX  / GLYCOLAX  Take 17 g by mouth daily as needed for mild constipation or moderate constipation. Also available OTC   prochlorperazine  10 MG tablet Commonly known as: COMPAZINE  Take 1 tablet (10 mg total) by mouth every 6 (six) hours as needed for nausea or vomiting.   torsemide  20 MG tablet Commonly known as: DEMADEX  Take 1 tablet (20 mg total) by mouth daily as needed. Take for weight gain > 3 lbs from baseline 140 lbs  or shortness of breath   traMADol  50 MG tablet Commonly known as: ULTRAM  Take 1 tablet (50 mg total) by mouth every 8 (eight) hours as needed for moderate pain (pain score 4-6) or severe pain (pain score 7-10) (pain).               Durable Medical Equipment  (From admission, onward)           Start     Ordered   10/11/23 1307  For home use only DME 4 wheeled rolling walker with seat  Once       Question:  Patient needs a walker to treat with the following condition  Answer:  Weakness   10/11/23 1306            Follow-up Information     [facility].  Go in 4 day(s).   Specialty: Cardiology Why: Hospital follow up 10/16/2023 @ 2 pm PLEASE bring a current medication list to appointment.  FREE valet parking, Entrance C, off [facility] [address] and [facility] entrance Contact information: [address] [place]  [phone]               Allergies  Allergen Reactions   Peanut Butter Flavoring Agent (Non-Screening) Itching   Nexium [Esomeprazole Magnesium ] Other (See Comments)    Headache    Consultations: Cardiology Critical care   Procedures/Studies: ECHOCARDIOGRAM LIMITED Result Date: 10/06/2023    ECHOCARDIOGRAM LIMITED REPORT   Patient Name:   HAROLD MATTES Date of Exam: 10/06/2023 Medical Rec #:  [id number]   Height:       59.0 in Accession #:    [id number]  Weight:       146.2 lb Date of Birth:  [birth year]   BSA:  1.614 m Patient Age:    83 years    BP:           187/68 mmHg Patient Gender: F           HR:           87 bpm. Exam Location:  Inpatient Procedure: Limited Echo (Both Spectral and Color Flow Doppler were utilized            during procedure). Indications:    Flash pulmonary edema (HCC)  History:        Patient has prior history of Echocardiogram examinations, most                 recent 08/22/2023. Signs/Symptoms:Dyspnea; Risk                 Factors:Hypertension.  Sonographer:    Jayson Gaskins Referring Phys: [phone] WHITNEY D HARRIS IMPRESSIONS  1. Limited Echocardiogram.  2. The left ventricle demonstrates both global and regional wall motion abnormalities (see scoring diagram/findings for description). The apex is akinetic. The entire lateral wall, anterior septum, and basal inferior segment are hypokinetic. The inferior septum and mid and distal inferior wall are normal. . Left ventricular ejection fraction, by estimation, is 25 to 30%. The left ventricle has severely decreased function. Left ventricular diastolic function could not be evaluated.  3. The mitral  valve is grossly normal.  4. The aortic valve was not well visualized. Aortic valve regurgitation is not visualized. Aortic valve sclerosis is present, with no evidence of aortic valve stenosis.  5. The inferior vena cava is normal in size with greater than 50% respiratory variability, suggesting right atrial pressure of 3 mmHg. Comparison(s): A prior study was performed on 08/22/2023. LVEF has decreased (from 60-65% to 25-30%). FINDINGS  Left Ventricle: The left ventricle demonstrates both global and regional wall motion abnormalities (see scoring diagram/findings for description). The apex is akinetic. The entire lateral wall, anterior septum, and basal inferior segment are hypokinetic. The inferior septum and mid and distal inferior wall are normal. Left ventricular ejection fraction, by estimation, is 25 to 30%. The left ventricle has severely decreased function. The left ventricular internal cavity size was normal in size. There is no left ventricular hypertrophy. Left ventricular diastolic function could not be evaluated. Left ventricular diastolic function could not be evaluated due to nondiagnostic images.  LV Wall Scoring: The apex is akinetic. The entire lateral wall, anterior septum, and basal inferior segment are hypokinetic. The inferior septum and mid and distal inferior wall are normal. Pericardium: There is no evidence of pericardial effusion. Mitral Valve: The mitral valve is grossly normal. Tricuspid Valve: The tricuspid valve is grossly normal. Aortic Valve: The aortic valve was not well visualized. Aortic valve regurgitation is not visualized. Aortic valve sclerosis is present, with no evidence of aortic valve stenosis. Venous: The inferior vena cava is normal in size with greater than 50% respiratory variability, suggesting right atrial pressure of 3 mmHg. LEFT VENTRICLE PLAX 2D LVIDd:         4.40 cm LVIDs:         4.00 cm LV PW:         0.90 cm LV IVS:        0.90 cm LVOT diam:     1.70 cm  LVOT Area:     2.27 cm  LEFT ATRIUM           Index        RIGHT ATRIUM  Index LA Vol (A4C): 33.4 ml 20.69 ml/m  RA Area:     5.84 cm                                    RA Volume:   7.51 ml  4.65 ml/m   SHUNTS Systemic Diam: 1.70 cm Sunit Tolia Electronically signed by Madonna Large Signature Date/Time: 10/06/2023/4:27:49 PM    Final    DG Chest Portable 1 View Result Date: 10/06/2023 CLINICAL DATA:  83 year old female intubated, enteric tube placement. EXAM: PORTABLE CHEST 1 VIEW COMPARISON:  0426 hours today, CTA chest today 10/06/2023. FINDINGS: Portable AP supine view at 0655 hours. Rotated to the right now. Endotracheal tube tip in good position between the clavicles and carina. Enteric tube courses to the left abdomen, tip not included. Stable right chest power port. Mildly lower lung volumes. Stable cardiac size and mediastinal contours. Increased mid and lower lung vague and interstitial opacity, symmetric. No pneumothorax or pleural effusion identified on this supine view. Negative visible bowel gas. Stable visualized osseous structures. IMPRESSION: 1. Satisfactory ET tube and Enteric tube. 2. Worsening symmetric perihilar and lower lung vague and interstitial opacity, CTA appearance today suggesting acute pulmonary edema (please see that report). Electronically Signed   By: VEAR Hurst M.D.   On: 10/06/2023 07:26   DG Chest Port 1 View Result Date: 10/06/2023 CLINICAL DATA:  Increasing central chest pain with shortness of breath. EXAM: PORTABLE CHEST 1 VIEW COMPARISON:  08/28/2023 FINDINGS: Cardiopericardial silhouette is at upper limits of normal for size. There is pulmonary vascular congestion without overt pulmonary edema. Component of interstitial pulmonary edema suspected. Tiny right pleural effusion evident. Right Port-A-Cath remains in place. IMPRESSION: Pulmonary vascular congestion with component of interstitial pulmonary edema and tiny right pleural effusion. Electronically Signed   By:  Camellia Candle M.D.   On: 10/06/2023 06:17   CT Angio Chest Pulmonary Embolism (PE) W or WO Contrast Result Date: 10/06/2023 CLINICAL DATA:  Bowel obstruction suspected PE suspected, respiratory distress, query worsening tumor burden lymphoma * Tracking Code: BO * EXAM: CT ANGIOGRAPHY CHEST CT ABDOMEN AND PELVIS WITH CONTRAST TECHNIQUE: Multidetector CT imaging of the chest was performed using the standard protocol during bolus administration of intravenous contrast. Multiplanar CT image reconstructions and MIPs were obtained to evaluate the vascular anatomy. Multidetector CT imaging of the abdomen and pelvis was performed using the standard protocol during bolus administration of intravenous contrast. RADIATION DOSE REDUCTION: This exam was performed according to the departmental dose-optimization program which includes automated exposure control, adjustment of the mA and/or kV according to patient size and/or use of iterative reconstruction technique. CONTRAST:  75mL OMNIPAQUE  IOHEXOL  350 MG/ML SOLN COMPARISON:  CT abdomen pelvis, 08/28/2023 PET-CT, 07/17/2018 FINDINGS: CT CHEST ANGIOGRAM FINDINGS Cardiovascular: Satisfactory opacification of the pulmonary arteries to the segmental level. No evidence of pulmonary embolism. Normal heart size. No pericardial effusion. Right chest port catheter. Aortic atherosclerosis. Mediastinum/Nodes: No enlarged mediastinal, hilar, or axillary lymph nodes. Thyroid  gland, trachea, and esophagus demonstrate no significant findings. Lungs/Pleura: Diffuse bilateral bronchial wall thickening, interlobular septal thickening, diffusely scattered ground-glass airspace opacity and small bilateral pleural effusions. Musculoskeletal: No chest wall abnormality. No acute osseous findings. Review of the MIP images confirms the above findings. CT ABDOMEN PELVIS FINDINGS Hepatobiliary: No solid liver abnormality is seen. No gallstones, gallbladder wall thickening, or biliary dilatation.  Pancreas: Unremarkable. No pancreatic ductal dilatation or surrounding inflammatory changes. Spleen: Normal  in size without significant abnormality. Adrenals/Urinary Tract: Adrenal glands are unremarkable. Extremely bulky soft tissue mass again encases the right kidney, not significantly changed, measuring 12.9 x 10.0 cm (series 4, image 44). Dilated right renal calices with overt effacement of the right renal pelvis. Normal left kidney. Bladder is unremarkable. Stomach/Bowel: Stomach is within normal limits. Appendix appears normal. No evidence of bowel wall thickening, distention, or inflammatory changes. Vascular/Lymphatic: Aortic atherosclerosis. No discretely enlarged abdominal or pelvic lymph nodes. Reproductive: No mass or other significant abnormality. Other: No abdominal wall hernia. Severe anasarca. Small volume ascites. Musculoskeletal: No acute or significant osseous findings. IMPRESSION: 1. Negative examination for pulmonary embolism. 2. Diffuse bilateral bronchial wall thickening, interlobular septal thickening, diffusely scattered ground-glass airspace opacity and small bilateral pleural effusions. Findings consistent with pulmonary edema. Drug toxicity is a differential consideration in the setting of chemotherapy. 3. Extremely bulky soft tissue mass again encases the right kidney, not significantly changed, measuring 12.9 x 10.0 cm. Dilated right renal calices with overt effacement of the right renal pelvis. 4. Severe anasarca.  Small volume ascites. Aortic Atherosclerosis (ICD10-I70.0). Electronically Signed   By: Marolyn JONETTA Jaksch M.D.   On: 10/06/2023 05:52   CT ABDOMEN PELVIS W CONTRAST Result Date: 10/06/2023 CLINICAL DATA:  Bowel obstruction suspected PE suspected, respiratory distress, query worsening tumor burden lymphoma * Tracking Code: BO * EXAM: CT ANGIOGRAPHY CHEST CT ABDOMEN AND PELVIS WITH CONTRAST TECHNIQUE: Multidetector CT imaging of the chest was performed using the standard  protocol during bolus administration of intravenous contrast. Multiplanar CT image reconstructions and MIPs were obtained to evaluate the vascular anatomy. Multidetector CT imaging of the abdomen and pelvis was performed using the standard protocol during bolus administration of intravenous contrast. RADIATION DOSE REDUCTION: This exam was performed according to the departmental dose-optimization program which includes automated exposure control, adjustment of the mA and/or kV according to patient size and/or use of iterative reconstruction technique. CONTRAST:  75mL OMNIPAQUE  IOHEXOL  350 MG/ML SOLN COMPARISON:  CT abdomen pelvis, 08/28/2023 PET-CT, 07/17/2018 FINDINGS: CT CHEST ANGIOGRAM FINDINGS Cardiovascular: Satisfactory opacification of the pulmonary arteries to the segmental level. No evidence of pulmonary embolism. Normal heart size. No pericardial effusion. Right chest port catheter. Aortic atherosclerosis. Mediastinum/Nodes: No enlarged mediastinal, hilar, or axillary lymph nodes. Thyroid  gland, trachea, and esophagus demonstrate no significant findings. Lungs/Pleura: Diffuse bilateral bronchial wall thickening, interlobular septal thickening, diffusely scattered ground-glass airspace opacity and small bilateral pleural effusions. Musculoskeletal: No chest wall abnormality. No acute osseous findings. Review of the MIP images confirms the above findings. CT ABDOMEN PELVIS FINDINGS Hepatobiliary: No solid liver abnormality is seen. No gallstones, gallbladder wall thickening, or biliary dilatation. Pancreas: Unremarkable. No pancreatic ductal dilatation or surrounding inflammatory changes. Spleen: Normal in size without significant abnormality. Adrenals/Urinary Tract: Adrenal glands are unremarkable. Extremely bulky soft tissue mass again encases the right kidney, not significantly changed, measuring 12.9 x 10.0 cm (series 4, image 44). Dilated right renal calices with overt effacement of the right renal  pelvis. Normal left kidney. Bladder is unremarkable. Stomach/Bowel: Stomach is within normal limits. Appendix appears normal. No evidence of bowel wall thickening, distention, or inflammatory changes. Vascular/Lymphatic: Aortic atherosclerosis. No discretely enlarged abdominal or pelvic lymph nodes. Reproductive: No mass or other significant abnormality. Other: No abdominal wall hernia. Severe anasarca. Small volume ascites. Musculoskeletal: No acute or significant osseous findings. IMPRESSION: 1. Negative examination for pulmonary embolism. 2. Diffuse bilateral bronchial wall thickening, interlobular septal thickening, diffusely scattered ground-glass airspace opacity and small bilateral pleural effusions. Findings consistent with pulmonary  edema. Drug toxicity is a differential consideration in the setting of chemotherapy. 3. Extremely bulky soft tissue mass again encases the right kidney, not significantly changed, measuring 12.9 x 10.0 cm. Dilated right renal calices with overt effacement of the right renal pelvis. 4. Severe anasarca.  Small volume ascites. Aortic Atherosclerosis (ICD10-I70.0). Electronically Signed   By: Marolyn JONETTA Jaksch M.D.   On: 10/06/2023 05:52   (Echo, Carotid, EGD, Colonoscopy, ERCP)    Subjective: Patient seen and examined.  son at the bedside.  Patient denies any complaints.  Eager to go home.   Discharge Exam: Vitals:   10/12/23 0724 10/12/23 1118  BP: 138/87 130/76  Pulse: 95 92  Resp: (!) 25 (!) 23  Temp: 98 F (36.7 C) 98 F (36.7 C)  SpO2: 97% 98%   Vitals:   10/12/23 0326 10/12/23 0500 10/12/23 0724 10/12/23 1118  BP: 133/67  138/87 130/76  Pulse: 96 89 95 92  Resp: 19 20 (!) 25 (!) 23  Temp: 98.1 F (36.7 C)  98 F (36.7 C) 98 F (36.7 C)  TempSrc: Oral  Oral Oral  SpO2: 97% 96% 97% 98%  Weight:  57.9 kg    Height:        General: Pt is alert, awake, not in acute distress Cardiovascular: RRR, S1/S2 +, no rubs, no gallops Respiratory: CTA  bilaterally, no wheezing, no rhonchi, on room air. Abdominal: Soft, NT, ND, bowel sounds + Extremities: no edema, no cyanosis, wearing compression stockings.    The results of significant diagnostics from this hospitalization (including imaging, microbiology, ancillary and laboratory) are listed below for reference.     Microbiology: Recent Results (from the past 240 hours)  Culture, Urine     Status: None   Collection Time: 10/04/23  6:12 PM   Specimen: Urine, Clean Catch  Result Value Ref Range Status   Specimen Description   Final    URINE, CLEAN CATCH Performed at [facility], [address], [place], [place] [address]    Special Requests   Final    NONE Performed at [facility], [address], [place], [place] [address]    Culture   Final    NO GROWTH Performed at [facility], [address] [place], [place] [address]    Report Status 10/05/2023 FINAL  Final  Culture, blood (Routine X 2) w Reflex to ID Panel     Status: None   Collection Time: 10/06/23  5:35 AM   Specimen: BLOOD  Result Value Ref Range Status   Specimen Description BLOOD RIGHT ANTECUBITAL  Final   Special Requests   Final    BOTTLES DRAWN AEROBIC AND ANAEROBIC Blood Culture results may not be optimal due to an inadequate volume of blood received in culture bottles   Culture   Final    NO GROWTH 5 DAYS Performed at [facility], [address], [place], [place] [address]    Report Status 10/11/2023 FINAL  Final  Resp panel by RT-PCR (RSV, Flu A&B, Covid) Anterior Nasal Swab     Status: None   Collection Time: 10/06/23  5:50 AM   Specimen: Anterior Nasal Swab  Result Value Ref Range Status   SARS Coronavirus 2 by RT PCR NEGATIVE NEGATIVE Final   Influenza A by PCR NEGATIVE NEGATIVE Final   Influenza B by PCR NEGATIVE NEGATIVE Final    Comment: (NOTE) The Xpert Xpress SARS-CoV-2/FLU/RSV plus assay is intended as an aid in  the diagnosis of  influenza from Nasopharyngeal swab specimens and should not be used as a sole basis for treatment. Nasal washings and aspirates are unacceptable for Xpert Xpress SARS-CoV-2/FLU/RSV testing.  Fact Sheet for Patients: BloggerCourse.com  Fact Sheet for Healthcare Providers: SeriousBroker.it  This test is not yet approved or cleared by the [place]  FDA and has been authorized for detection and/or diagnosis of SARS-CoV-2 by FDA under an Emergency Use Authorization (EUA). This EUA will remain in effect (meaning this test can be used) for the duration of the COVID-19 declaration under Section 564(b)(1) of the Act, 21 U.S.C. section 360bbb-3(b)(1), unless the authorization is terminated or revoked.     Resp Syncytial Virus by PCR NEGATIVE NEGATIVE Final    Comment: (NOTE) Fact Sheet for Patients: BloggerCourse.com  Fact Sheet for Healthcare Providers: SeriousBroker.it  This test is not yet approved or cleared by the [place]  FDA and has been authorized for detection and/or diagnosis of SARS-CoV-2 by FDA under an Emergency Use Authorization (EUA). This EUA will remain in effect (meaning this test can be used) for the duration of the COVID-19 declaration under Section 564(b)(1) of the Act, 21 U.S.C. section 360bbb-3(b)(1), unless the authorization is terminated or revoked.  Performed at [facility], [address], [place], [place] [address]   Culture, blood (Routine X 2) w Reflex to ID Panel     Status: None   Collection Time: 10/06/23  6:15 AM   Specimen: BLOOD RIGHT HAND  Result Value Ref Range Status   Specimen Description BLOOD RIGHT HAND  Final   Special Requests   Final    BOTTLES DRAWN AEROBIC AND ANAEROBIC Blood Culture adequate volume   Culture   Final    NO GROWTH 5 DAYS Performed at [facility], [address], [place], [place] [address]     Report Status 10/11/2023 FINAL  Final  MRSA Next Gen by PCR, Nasal     Status: Abnormal   Collection Time: 10/06/23  8:08 AM   Specimen: Urine, Catheterized; Nasal Swab  Result Value Ref Range Status   MRSA by PCR Next Gen DETECTED (A) NOT DETECTED Final    Comment: RESULT CALLED TO, READ BACK BY AND VERIFIED WITH: RN ZAHRA [id number] AT 1101 BY EC (NOTE) The GeneXpert MRSA Assay (FDA approved for NASAL specimens only), is one component of a comprehensive MRSA colonization surveillance program. It is not intended to diagnose MRSA infection nor to guide or monitor treatment for MRSA infections. Test performance is not FDA approved in patients less than 35 years old. Performed at [facility], [address], [place], [place] [address]      Labs: BNP (last 3 results) Recent Labs    08/09/23 1759 10/06/23 0416  BNP 38.2 1,289.3*   Basic Metabolic Panel: Recent Labs  Lab 10/07/23 0144 10/08/23 0500 10/08/23 2113 10/09/23 0500 10/10/23 1000 10/11/23 0325 10/12/23 0325  NA 139 138  --  141 136 134* 135  K 4.0 3.2*  --  4.4 3.8 3.5 3.5  CL 104 99  --  102 98 99 96*  CO2 20* 25  --  25 26 25 27   GLUCOSE 99 101*  --  101* 123* 103* 92  BUN 33* 35*  --  36* 48* 41* 42*  CREATININE 2.03* 2.07*  --  1.98* 1.71* 1.67* 1.74*  CALCIUM 8.2* 8.4*  --  8.8* 8.8* 8.2* 8.5*  MG 1.5* 2.2 2.0  --  1.9 1.9 1.8  PHOS 5.0*  --   --   --   --   --   --    Liver Function Tests: Recent Labs  Lab 10/06/23 0416  AST 39  ALT 23  ALKPHOS 108  BILITOT 1.3*  PROT 5.7*  ALBUMIN  2.9*   No results for input(s): LIPASE, AMYLASE in the last 168 hours. No results for input(s): AMMONIA in the last 168 hours. CBC: Recent Labs  Lab 10/08/23 0500 10/09/23 0500 10/10/23 0457 10/11/23 0325 10/12/23 0325  WBC 7.7 6.6 6.0 4.9 2.4*  HGB 7.0* 8.7* 9.1* 9.0* 8.2*  HCT 22.0* 27.6* 28.6* 28.0* 25.8*  MCV 88.7 89.9 89.4 88.9 91.2  PLT 148* 157 172 166 158   Cardiac Enzymes: No results  for input(s): CKTOTAL, CKMB, CKMBINDEX, TROPONINI in the last 168 hours. BNP: Invalid input(s): POCBNP CBG: Recent Labs  Lab 10/06/23 2326 10/07/23 0339 10/07/23 0753 10/07/23 1121 10/07/23 1554  GLUCAP 102* 102* 108* 105* 95   D-Dimer No results for input(s): DDIMER in the last 72 hours. Hgb A1c No results for input(s): HGBA1C in the last 72 hours. Lipid Profile No results for input(s): CHOL, HDL, LDLCALC, TRIG, CHOLHDL, LDLDIRECT in the last 72 hours. Thyroid  function studies No results for input(s): TSH, T4TOTAL, T3FREE, THYROIDAB in the last 72 hours.  Invalid input(s): FREET3 Anemia work up No results for input(s): VITAMINB12, FOLATE, FERRITIN, TIBC, IRON, RETICCTPCT in the last 72 hours. Urinalysis    Component Value Date/Time   COLORURINE YELLOW 10/06/2023 0816   COLORURINE YELLOW 10/06/2023 0816   APPEARANCEUR HAZY (A) 10/06/2023 0816   APPEARANCEUR CLEAR 10/06/2023 0816   LABSPEC 1.019 10/06/2023 0816   LABSPEC 1.019 10/06/2023 0816   PHURINE 6.0 10/06/2023 0816   PHURINE 6.0 10/06/2023 0816   GLUCOSEU NEGATIVE 10/06/2023 0816   GLUCOSEU NEGATIVE 10/06/2023 0816   HGBUR SMALL (A) 10/06/2023 0816   HGBUR SMALL (A) 10/06/2023 0816   BILIRUBINUR NEGATIVE 10/06/2023 0816   BILIRUBINUR NEGATIVE 10/06/2023 0816   KETONESUR NEGATIVE 10/06/2023 0816   KETONESUR NEGATIVE 10/06/2023 0816   PROTEINUR 100 (A) 10/06/2023 0816   PROTEINUR 100 (A) 10/06/2023 0816   UROBILINOGEN 0.2 05/16/2014 1231   NITRITE NEGATIVE 10/06/2023 0816   NITRITE NEGATIVE 10/06/2023 0816   LEUKOCYTESUR NEGATIVE 10/06/2023 0816   LEUKOCYTESUR NEGATIVE 10/06/2023 0816   Sepsis Labs Recent Labs  Lab 10/09/23 0500 10/10/23 0457 10/11/23 0325 10/12/23 0325  WBC 6.6 6.0 4.9 2.4*   Microbiology Recent Results (from the past 240 hours)  Culture, Urine     Status: None   Collection Time: 10/04/23  6:12 PM   Specimen: Urine, Clean Catch   Result Value Ref Range Status   Specimen Description   Final    URINE, CLEAN CATCH Performed at [facility], [address], [place], [place] [address]    Special Requests   Final    NONE Performed at [facility], [address], [place], [place] [address]    Culture   Final    NO GROWTH Performed at [facility], [address] [place], [place] [address]    Report Status 10/05/2023 FINAL  Final  Culture, blood (Routine X 2) w Reflex to ID Panel     Status: None   Collection Time: 10/06/23  5:35 AM   Specimen: BLOOD  Result Value Ref Range Status   Specimen Description BLOOD RIGHT ANTECUBITAL  Final   Special Requests   Final    BOTTLES DRAWN  AEROBIC AND ANAEROBIC Blood Culture results may not be optimal due to an inadequate volume of blood received in culture bottles   Culture   Final    NO GROWTH 5 DAYS Performed at [facility], [address], [place], [place] [address]    Report Status 10/11/2023 FINAL  Final  Resp panel by RT-PCR (RSV, Flu A&B, Covid) Anterior Nasal Swab     Status: None   Collection Time: 10/06/23  5:50 AM   Specimen: Anterior Nasal Swab  Result Value Ref Range Status   SARS Coronavirus 2 by RT PCR NEGATIVE NEGATIVE Final   Influenza A by PCR NEGATIVE NEGATIVE Final   Influenza B by PCR NEGATIVE NEGATIVE Final    Comment: (NOTE) The Xpert Xpress SARS-CoV-2/FLU/RSV plus assay is intended as an aid in the diagnosis of influenza from Nasopharyngeal swab specimens and should not be used as a sole basis for treatment. Nasal washings and aspirates are unacceptable for Xpert Xpress SARS-CoV-2/FLU/RSV testing.  Fact Sheet for Patients: BloggerCourse.com  Fact Sheet for Healthcare Providers: SeriousBroker.it  This test is not yet approved or cleared by the [place]  FDA and has been authorized for detection and/or diagnosis of SARS-CoV-2  by FDA under an Emergency Use Authorization (EUA). This EUA will remain in effect (meaning this test can be used) for the duration of the COVID-19 declaration under Section 564(b)(1) of the Act, 21 U.S.C. section 360bbb-3(b)(1), unless the authorization is terminated or revoked.     Resp Syncytial Virus by PCR NEGATIVE NEGATIVE Final    Comment: (NOTE) Fact Sheet for Patients: BloggerCourse.com  Fact Sheet for Healthcare Providers: SeriousBroker.it  This test is not yet approved or cleared by the [place]  FDA and has been authorized for detection and/or diagnosis of SARS-CoV-2 by FDA under an Emergency Use Authorization (EUA). This EUA will remain in effect (meaning this test can be used) for the duration of the COVID-19 declaration under Section 564(b)(1) of the Act, 21 U.S.C. section 360bbb-3(b)(1), unless the authorization is terminated or revoked.  Performed at [facility], [address], [place], [place] [address]   Culture, blood (Routine X 2) w Reflex to ID Panel     Status: None   Collection Time: 10/06/23  6:15 AM   Specimen: BLOOD RIGHT HAND  Result Value Ref Range Status   Specimen Description BLOOD RIGHT HAND  Final   Special Requests   Final    BOTTLES DRAWN AEROBIC AND ANAEROBIC Blood Culture adequate volume   Culture   Final    NO GROWTH 5 DAYS Performed at [facility], [address], [place], [place] [address]    Report Status 10/11/2023 FINAL  Final  MRSA Next Gen by PCR, Nasal     Status: Abnormal   Collection Time: 10/06/23  8:08 AM   Specimen: Urine, Catheterized; Nasal Swab  Result Value Ref Range Status   MRSA by PCR Next Gen DETECTED (A) NOT DETECTED Final    Comment: RESULT CALLED TO, READ BACK BY AND VERIFIED WITH: RN ZAHRA [id number] AT 1101 BY EC (NOTE) The GeneXpert MRSA Assay (FDA approved for NASAL specimens only), is one component of a comprehensive MRSA colonization  surveillance program. It is not intended to diagnose MRSA infection nor to guide or monitor treatment for MRSA infections. Test performance is not FDA approved in patients less than 41 years old. Performed at [facility], [address] [place], [place] [address]  Time coordinating discharge: 40 minutes  SIGNED:   Renato Applebaum, MD  [facility] 10/12/2023, 1:25 PM

## 2023-10-12 NOTE — Plan of Care (Signed)
  Problem: Clinical Measurements: Goal: Respiratory complications will improve Outcome: Progressing   Problem: Nutrition: Goal: Adequate nutrition will be maintained Outcome: Progressing   Problem: Coping: Goal: Level of anxiety will decrease Outcome: Progressing   

## 2023-10-12 NOTE — Progress Notes (Signed)
   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   Presented to the ED on 7/4 with chest pain, shortness of breath, diaphoresis, and lightheadedness. Severely hypertensive and tachycardic on arrival to the ED. CTA negative for PE. Hs troponin elevated, 63>>343>>1186.  BNP elevated 1289. Intubated for respiratory failure secondary to flash pulmonary edema after failing BiPAP. Started on lasix  drip. ECHO on 7/4 with LVEF 25-30% (was 60-65% in 08/2023), both global and RWMA, entire lateral wall, anterior septum, and basal inferior segment are hypokinetic. Cardiology was consulted. Extubated on 7/5. Cath limited at this time due to anemia requiring blood transfusion.   States she is feeling well today. No shortness of breath. LE edema improved. Up sitting in the chair. TED hose off. Discussed goals of medication therapy. No questions or concerns at this time.   Current HF Medications: None  Prior to admission HF Medications: Diuretic: torsemide  20 mg daily PRN  Pertinent Lab Values: Serum creatinine 1.74, BUN 42, Potassium 3.5, Sodium 135, BNP 1289.3, Magnesium  1.8  Vital Signs: Weight: 127 lbs (admission weight: 146 lbs) Blood pressure: 130/60s  Heart rate: 90-100s  I/O: net -1.4L yesterday; net -8L since admission  Medication Assistance / Insurance Benefits Check: Does the patient have prescription insurance?  Yes Type of insurance plan: [employer] Medicare  Outpatient Pharmacy:  Prior to admission outpatient pharmacy: [facility] Is the patient willing to use [facility] TOC pharmacy at discharge? Yes Is the patient willing to transition their outpatient pharmacy to utilize a [facility] outpatient pharmacy?   No    Assessment: 1. Acute systolic CHF (LVEF 25-30%), due to  cardiotoxicity vs ischemia. NYHA class II symptoms. - Now off IV lasix . TED hose off. Strict I/Os and daily weights - Consider starting metoprolol XL 12.5 mg daily now that she is euvolemic - Consider adding spironolactone 12.5 mg daily   Plan: 1) Medication changes recommended at this time: - Add spironolactone 12.5 mg daily  - Add metoprolol XL 12.5 mg daily  2) Patient assistance: - $100.59 left on deductible - Entresto/Farxiga/Jardiance copay - First fill $147.59, then $47 per month going forward. - Patient states this may be difficult to afford - likely qualifies for grant for copay assistance  3)  Education - Initial education completed - Full education to be completed prior to discharge   Duwaine Plant, PharmD, BCPS Heart Failure Stewardship Pharmacist Phone [phone]

## 2023-10-13 ENCOUNTER — Other Ambulatory Visit

## 2023-10-13 ENCOUNTER — Encounter

## 2023-10-13 NOTE — Progress Notes (Signed)
 HEART & VASCULAR TRANSITION OF CARE CONSULT NOTE   Referring Physician: Dr. Raenelle PCP: Ngetich, Roxan BROCKS, NP  Cardiologist: Lonni LITTIE [name], MD  Chief Complaint: Recently diagnosed systolic heart failure  HPI: Referred to clinic by Dr. Raenelle for heart failure consultation.   Jacqueline Hancock is a 83 y.o. 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   Last saw hem-onc 7/25, plan was for R-mini CHOP with adriamycin .  Recently admitted acute hypoxemic respiratory failure and volume overload. Required brief ICU stay 2/2 somnolence and flash pulmonary edema requiring intubation.  Found to have acute systolic heart failure. EF went from 60-65% > 25-30% in 2 months.  She was able to be extubated and transferred out of ICU.  Also found to have NSTEMI.  Treated with heparin  infusion, now on ASA.  Unable to undergo heart cath due to pancytopenia.  GDMT not tolerated 2/2 hypotension and elevated renal function.  Today she presents for AHF [facility] clinic visit with her son (one of our patients). Overall feeling good. Denies palpitations, CP, dizziness or Orthopnea. Has had BLE edema for a couple of months now, improved while admitted but back to where they were before. SOB only when laying flat. Appetite ok, son cooks, does not use salt. No fever or chills. Weight at home 120-124 pounds. Taking all medications. Denies ETOH, tobacco or drug use (never). Unable to rest at night. Sleeps on 2 pillows. + PND. HTN runs in the family. SBP at home 150s  Cardiac Testing  Echo 7/25: LV with global and RWMA, LVEF 25-30% Echo 5/25: EF 60-65%  Past Medical History:  Diagnosis Date   CLL (chronic lymphocytic leukemia) (HCC)    Dyspnea 2021   with exersion    Early cataracts, bilateral    Elevated hemoglobin A1c    Fibroids    GERD (gastroesophageal reflux disease)    Hyperlipidemia    Hypertension     Lymphoma (HCC)    Osteopenia     Current Outpatient Medications  Medication Sig Dispense Refill   aspirin  81 MG chewable tablet Chew 1 tablet (81 mg total) by mouth daily. 90 tablet 0   pantoprazole  (PROTONIX ) 40 MG tablet Take 1 tablet (40 mg total) by mouth 2 (two) times daily for 56 days, THEN 1 tablet (40 mg total) daily. 142 tablet 0   polyethylene glycol (MIRALAX  / GLYCOLAX ) 17 g packet Take 17 g by mouth daily as needed for mild constipation or moderate constipation. Also available OTC 30 each 0   prochlorperazine  (COMPAZINE ) 10 MG tablet Take 1 tablet (10 mg total) by mouth every 6 (six) hours as needed for nausea or vomiting. 30 tablet 6   torsemide  (DEMADEX ) 20 MG tablet Take 1 tablet (20 mg total) by mouth daily as needed. Take for weight gain > 3 lbs from baseline 140 lbs  or shortness of breath 90 tablet 1   traMADol  (ULTRAM ) 50 MG tablet Take 1 tablet (50 mg total) by mouth every 8 (eight) hours as needed for moderate pain (pain score 4-6) or severe pain (pain score 7-10) (pain). 20 tablet 0   No current facility-administered medications for this encounter.   Facility-Administered Medications Ordered in Other Encounters  Medication Dose Route Frequency Provider Last Rate Last Admin   pegfilgrastim -cbqv (UDENYCA ) injection 6 mg  6 mg Subcutaneous Once Kale, Gautam Kishore, MD        Allergies  Allergen Reactions   Peanut Butter Flavoring Agent (Non-Screening) Itching   Nexium [Esomeprazole Magnesium ] Other (See Comments)    Headache      Social History   Socioeconomic History   Marital status: Widowed    Spouse name: Not on file   Number of children: 3   Years of education: Not on file   Highest education level: Not on file  Occupational History   Occupation: retired  Tobacco Use   Smoking status: Never   Smokeless tobacco: Never  Vaping Use   Vaping status: Never Used  Substance and Sexual Activity   Alcohol use: No   Drug use: Never   Sexual activity:  Not on file  Other Topics Concern   Not on file  Social History Narrative   Not on file   Social Drivers of Health   Financial Resource Strain: Low Risk  (10/11/2023)   Overall Financial Resource Strain (CARDIA)    Difficulty of Paying Living Expenses: Not very hard  Food Insecurity: No Food Insecurity (10/06/2023)   Hunger Vital Sign    Worried About Running Out of Food in the Last Year: Never true    Ran Out of Food in the Last Year: Never true  Transportation Needs: No Transportation Needs (10/11/2023)   PRAPARE - Administrator, Civil Service (Medical): No    Lack of Transportation (Non-Medical): No  Physical Activity: Not on file  Stress: Not on file  Social Connections: Moderately Integrated (10/06/2023)   Social Connection and Isolation Panel    Frequency of Communication with Friends and Family: More than three times a week    Frequency of Social Gatherings with Friends and Family: Three times a week    Attends Religious Services: More than 4 times per year    Active Member of Clubs or Organizations: Not on file    Attends Club or Organization Meetings: More than 4 times per year    Marital Status: Widowed  Intimate Partner Violence: Patient Unable To Answer (10/06/2023)   Humiliation, Afraid, Rape, and Kick questionnaire    Fear of Current or Ex-Partner: Patient unable to answer    Emotionally Abused: Patient unable to answer    Physically Abused: Patient unable to answer    Sexually Abused: Patient unable to answer      Family History  Problem Relation Age of Onset   Leukemia Mother    Hypertension Father    Diabetes Sister    Heart disease Brother    Diabetes Brother    Diabetes Brother    Hypertension Son    Heart disease Son     Vitals:   10/16/23 1410  BP: (!) 152/72  Pulse: 97  SpO2: 99%  Weight: 57.6 kg (127 lb)  Height: 4' 11 (1.499 m)    PHYSICAL EXAM: General:  elderly appearing.  No respiratory difficulty. Arrived in [facility] Neck: supple.  JVD ~10 cm.  Cor: PMI nondisplaced. Regular rate & rhythm. No rubs, gallops or murmurs. Lungs: clear, diminished bases Extremities: no cyanosis, clubbing, rash, +2-3 BLE edema  Neuro: alert & oriented x 3. Moves all 4 extremities w/o difficulty. Affect pleasant.   ReDs reading: 30 %, normal    Wt Readings from Last 3 Encounters:  10/16/23 57.6 kg (127 lb)  10/12/23 57.9 kg (127 lb 10.3 oz)  10/04/23 64 kg (141 lb)    ECG: NSR 95 bpm. QTc 547 (Personally reviewed)    ASSESSMENT & PLAN:  Recently diagnosed systolic heart failure  Echo 5/25: EF 60-65% Echo 7/25: LV with global and RWMA, LVEF 25-30% - Suspect iCM vs chemo induced DM. Correlates with timing after starting adriamycin .  - NYHA IIIb - Appears volume overloaded. ReDS clip does not correlate with physical exam.  GDMT:  Diuretic- Stop Lasix . Start Torsemide  20 mg daily. Will take 40 mg daily for the next 2 days.  BB- Hold off on adding today with volume overload Ace/ARB/ARNI/ MRA- consider adding at f/u if renal function remains at baseline SGLT2i- Start Jardiance  10 mg daily. Denies GU symptoms or hx of UTIs. Labs today, repeat in 7 days - Asked her to wear compression socks that she has at home - GDMT limited by CKD. SCr however at baseline, will add GDMT as tolerated.  - Not a candidate for advanced therpies with co morbidities, frailty and age.   NSTEMI - HsTrop elevated during last admission - cath deferred with pancytopenia - Continue ASA - Denies CP  CKD stage IIIb - SCr last 1.74. 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 BP slightly elevated today. Allow renal perfusion - Not candidate for nephrostomy tube per IR (during recent admission). Per Urology, not candidate for stenting  - BMET today   4. CLL Large B-cell lymphoma - Followed by hematology-oncology - Currently on R-mini CHOP  with adriamycin . (Treatment 3/5) - Significant drop in EF after starting adriamycin . Would hold off on further treatments and aggressively titrate GDMT as tolerated. Discussed with Dr. Rolan.  - Recommend frequent echos, next one in ~3 months - Will forward note to Dr. Onesimo   Referred to HFSW (PCP, Medications, Transportation, ETOH Abuse, Drug Abuse, Insurance, Financial ): No Refer to Pharmacy: No Refer to Home Health: No Refer to Advanced Heart Failure Clinic: Yes  Refer to General Cardiology: No  Follow up in 2 weeks with APP to reassess fluid and titrate GDMT. F/u in 4-6 weeks with Pharm D to further titrate GDMT. F/u in 8 weeks with Dr. Rolan with echo.

## 2023-10-16 ENCOUNTER — Telehealth: Payer: Self-pay

## 2023-10-16 ENCOUNTER — Telehealth (HOSPITAL_COMMUNITY): Payer: Self-pay

## 2023-10-16 ENCOUNTER — Other Ambulatory Visit (HOSPITAL_COMMUNITY): Payer: Self-pay

## 2023-10-16 ENCOUNTER — Encounter (HOSPITAL_COMMUNITY): Payer: Self-pay

## 2023-10-16 ENCOUNTER — Ambulatory Visit (HOSPITAL_COMMUNITY)
Admit: 2023-10-16 | Discharge: 2023-10-16 | Disposition: A | Discharge status: Home / Self Care | Attending: Internal Medicine | Admitting: Internal Medicine

## 2023-10-16 VITALS — BP 152/72 | HR 97 | Ht 59.0 in | Wt 127.0 lb

## 2023-10-16 DIAGNOSIS — C9110 Chronic lymphocytic leukemia of B-cell type not having achieved remission: Secondary | ICD-10-CM | POA: Diagnosis not present

## 2023-10-16 DIAGNOSIS — I252 Old myocardial infarction: Secondary | ICD-10-CM

## 2023-10-16 DIAGNOSIS — Z79899 Other long term (current) drug therapy: Secondary | ICD-10-CM | POA: Diagnosis not present

## 2023-10-16 DIAGNOSIS — C8330 Diffuse large B-cell lymphoma, unspecified site: Secondary | ICD-10-CM

## 2023-10-16 DIAGNOSIS — I130 Hypertensive heart and chronic kidney disease with heart failure and stage 1 through stage 4 chronic kidney disease, or unspecified chronic kidney disease: Secondary | ICD-10-CM | POA: Insufficient documentation

## 2023-10-16 DIAGNOSIS — E8770 Fluid overload, unspecified: Secondary | ICD-10-CM | POA: Diagnosis not present

## 2023-10-16 DIAGNOSIS — M8580 Other specified disorders of bone density and structure, unspecified site: Secondary | ICD-10-CM | POA: Insufficient documentation

## 2023-10-16 DIAGNOSIS — N1832 Chronic kidney disease, stage 3b: Secondary | ICD-10-CM | POA: Diagnosis not present

## 2023-10-16 DIAGNOSIS — R600 Localized edema: Secondary | ICD-10-CM

## 2023-10-16 DIAGNOSIS — I214 Non-ST elevation (NSTEMI) myocardial infarction: Secondary | ICD-10-CM | POA: Diagnosis not present

## 2023-10-16 DIAGNOSIS — Z7982 Long term (current) use of aspirin: Secondary | ICD-10-CM | POA: Diagnosis not present

## 2023-10-16 DIAGNOSIS — D61818 Other pancytopenia: Secondary | ICD-10-CM | POA: Diagnosis not present

## 2023-10-16 DIAGNOSIS — I5042 Chronic combined systolic (congestive) and diastolic (congestive) heart failure: Secondary | ICD-10-CM | POA: Insufficient documentation

## 2023-10-16 DIAGNOSIS — I5022 Chronic systolic (congestive) heart failure: Secondary | ICD-10-CM | POA: Diagnosis not present

## 2023-10-16 MED ORDER — TORSEMIDE 20 MG PO TABS
20.0000 mg | ORAL_TABLET | Freq: Every day | ORAL | 1 refills | Status: DC
  Filled 2023-10-16: qty 90, 90d supply, fill #0

## 2023-10-16 MED ORDER — EMPAGLIFLOZIN 10 MG PO TABS
10.0000 mg | ORAL_TABLET | Freq: Every day | ORAL | 5 refills | Status: DC
  Filled 2023-10-16: qty 30, 30d supply, fill #0
  Filled 2023-12-26: qty 30, 30d supply, fill #1
  Filled 2024-01-30: qty 30, 30d supply, fill #2

## 2023-10-16 NOTE — Progress Notes (Signed)
 Copy of OV note today routed via Epic to Dr. Maryla office for follow up.

## 2023-10-16 NOTE — Addendum Note (Signed)
 Encounter addended by: Kapil Petropoulos B, RN on: 10/16/2023 3:45 PM  Actions taken: Clinical Note Signed

## 2023-10-16 NOTE — Patient Instructions (Signed)
 Medication Changes:  START:JARDIANCE  10MG  ONCE DAILY   START: TORSEMIDE  20MG  ONCE DAILY (PLEASE TAKE 2 TABLETS (40MG ) THE FIRST 2 DAYS) THEN RETURN TO 20MG  DAILY THEREAFTER  STOP FUROSEMIDE    Lab Work:  Labs done today, your results will be available in MyChart, we will contact you for abnormal readings.  THEN PLEASE RETURN FOR LABS IN 7 DAYS AS SCHEDULED   Special Instructions // Education:  WHERE COMPRESSION STOCKINGS   Follow-Up in: AS SCHEDULED   THEN 4-6 WEEKS WITH PHARMACY AS SCHEDULED   THEN 8 WEEKS AS SCHEDULED WITH DR. ROLAN WITH AN ECHO  At the Advanced [facility] Failure Clinic, you and your health needs are our priority. We have a designated team specialized in the treatment of Heart Failure. This Care Team includes your primary Heart Failure Specialized Cardiologist (physician), Advanced Practice Providers (APPs- Physician Assistants and Nurse Practitioners), and Pharmacist who all work together to provide you with the care you need, when you need it.   You may see any of the following providers on your designated Care Team at your next follow up:  Dr. Toribio Fuel Dr. Ezra ROLAN Dr. Ria Commander Dr. Odis Brownie Greig Mosses, NP Caffie Shed, [place] [facility] [place], [place] Beckey Coe, NP [place] Lee, NP Tinnie Redman, PharmD   Please be sure to bring in all your medications bottles to every appointment.   Need to Contact Us :  If you have any questions or concerns before your next appointment please send us  a message through [employer] or call our office at [phone].    TO LEAVE A MESSAGE FOR THE NURSE SELECT OPTION 2, PLEASE LEAVE A MESSAGE INCLUDING: YOUR NAME DATE OF BIRTH CALL BACK NUMBER REASON FOR CALL**this is important as we prioritize the call backs  YOU WILL RECEIVE A CALL BACK THE SAME DAY AS LONG AS YOU CALL BEFORE 4:00 PM

## 2023-10-16 NOTE — Progress Notes (Signed)
 ReDS Vest / Clip - 10/16/23 1410       ReDS Vest / Clip   Station Marker A    Ruler Value 34    ReDS Value Range Low volume    ReDS Actual Value 30

## 2023-10-16 NOTE — Telephone Encounter (Signed)
 Call returned to [facility] Agency and verbal orders authorized per [employer] standing order

## 2023-10-16 NOTE — Telephone Encounter (Signed)
 Advanced Heart Failure Patient Advocate Encounter  Test billing for this patients current coverage shows a remaining deductible of $100.59 for 2025.  Farxiga, Jardiance  would each cost $147.59 for the first 30 day fill, then cost $47 for 30 days, $141 for 90 day supply after deductible is met.  Rachel DEL, CPhT Rx Patient Advocate Phone: [phone]

## 2023-10-16 NOTE — Telephone Encounter (Signed)
 Copied from CRM [id number]. Topic: Clinical - Home Health Verbal Orders >> Oct 16, 2023  9:05 AM Miquel SAILOR wrote: Caller/Agency: Amy from [facility]  Callback Number: [phone] Service Requested: Physical Therapy and Skilled Nursing Frequency: 2 week 3/Wounds to butt Any new concerns about the patient? No

## 2023-10-19 ENCOUNTER — Encounter (HOSPITAL_COMMUNITY)
Admission: RE | Admit: 2023-10-19 | Discharge: 2023-10-19 | Disposition: A | Discharge status: Home / Self Care | Source: Ambulatory Visit | Attending: Hematology | Admitting: Hematology

## 2023-10-19 DIAGNOSIS — C8338 Diffuse large B-cell lymphoma, lymph nodes of multiple sites: Secondary | ICD-10-CM | POA: Insufficient documentation

## 2023-10-19 LAB — GLUCOSE, CAPILLARY: Glucose-Capillary: 88 mg/dL (ref 70–99)

## 2023-10-19 MED ORDER — FLUDEOXYGLUCOSE F - 18 (FDG) INJECTION
6.3500 | Freq: Once | INTRAVENOUS | Status: AC
  Administered 2023-10-19: 6.33 via INTRAVENOUS

## 2023-10-23 ENCOUNTER — Other Ambulatory Visit (HOSPITAL_COMMUNITY)

## 2023-10-23 ENCOUNTER — Telehealth: Payer: Self-pay | Admitting: *Deleted

## 2023-10-23 NOTE — Telephone Encounter (Signed)
 10/16/2023   Signed      Call returned to [facility] Agency and verbal orders authorized per [employer] standing order

## 2023-10-23 NOTE — Telephone Encounter (Signed)
 Copied from CRM [id number]. Topic: Clinical - Home Health Verbal Orders >> Oct 20, 2023  4:26 PM Miquel SAILOR wrote: Caller/Agency: Vickie from [facility]  Callback Number: [phone] Service Requested: Skilled Nursing Frequency: 2 week 4-Wound care/Disease management/Patient Education/observation and assessment Also patient has CHF(Congestive Heart Failure) Any new concerns about the patient? No

## 2023-10-24 ENCOUNTER — Other Ambulatory Visit (HOSPITAL_COMMUNITY)

## 2023-10-24 ENCOUNTER — Telehealth: Payer: Self-pay

## 2023-10-24 MED FILL — Fosaprepitant Dimeglumine For IV Infusion 150 MG (Base Eq): INTRAVENOUS | Qty: 5 | Status: AC

## 2023-10-24 NOTE — Telephone Encounter (Signed)
 Copied from CRM #[id number]. Topic: Clinical - Medical Advice >> Oct 24, 2023  3:16 PM Suzette B wrote: Reason for CRM: Sabrina of [facility] called out of concern for patient. She stated last week she went to visit patient and the weigh in was 125.8 today she weighed her and it was 113.6 she is concerned of that being a significant amount of weight lost in a weeks time. She is aware of the CHF, but vital signs are really good, slight swelling in the legs/feet. She stated she would like to speak with someone in regard of whether this is the medication torsemide  (DEMADEX ) 20 MG tablet

## 2023-10-24 NOTE — Progress Notes (Signed)
 HEMATOLOGY/ONCOLOGY CLINIC NOTE  Date of Service: 10/25/2023  Patient Care Team: Ngetich, Roxan BROCKS, NP as PCP - General (Family Medicine) Kate Lonni CROME, MD as PCP - Cardiology (Cardiology) Arlana Arnt, MD as Consulting Physician (Otolaryngology) Luis Purchase, MD as Consulting Physician (Gastroenterology) Raeanne, Shanda SQUIBB, MD (Inactive) as Consulting Physician (Obstetrics and Gynecology) Roz Anes, MD as Consulting Physician (Ophthalmology) Quinn Odor, [facility] (Inactive) as Pharmacist (Pharmacist) Onesimo Emaline Brink, MD as Consulting Physician (Hematology)  CHIEF COMPLAINTS/PURPOSE OF CONSULTATION:  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    Prednisone  60 mg daily in the January 2022 to treat her hemolytic anemia.  This was tapered off after complete hematological response.  HISTORY OF PRESENTING ILLNESS:   Jacqueline Hancock is a wonderful 83 y.o. female who is here for continued evaluation and management of NHL, unspecified body region. Patient was following up with Dr. Amadeo.   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 BM Bx Cyclin D1 IHC +ve byt FISH neg . She has presented with splenomegaly, anemia, and lymphocytosis. 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   Patient's current treatment is Imbruvica  280 mg. She was started on Imbruvica  560 mg on January 2022, which was then reduced to 280 mg in July 2023.   Patient was last seen by Dr. Amadeo on 02/10/2022 and she was doing well overall.   Patient reports she has been doing well overall since our last visit. She does complains of itchy skin rashes, mostly around the face and head, for around 2-3 weeks ago. Patient notes that she was started couple of new medications from other physicians, but she is unsure of the  names. Patient notes that she was started on oxycodone around 3-4 weeks ago. She discontinued oxycodone.   She notes that there is redness around her face, which is not new.   Patient is currently taking Imbruvica  280 mg as prescribed and has been tolerating it well without any severe toxicities.   She is currently taking Iron supplement, calcium supplement, and Vitamin-D supplement.   She denies fever, chills, night sweats, unexpected weight loss, back pain, abdominal pain, chest pain, or leg swelling. Patient does complain of occasional abdominal pain.   Patient notes she had a surgery near her right neck/ear area to remove an enlarged lymph node couple years ago. She complains of mild occasional pain near the site.  INTERVAL HISTORY:  Jacqueline Hancock is a wonderful 83 y.o. 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   Patient was last seen by me on 09/21/2023 and complained of leg weeping, urinary frequency with lasix , poor taste, issues swallowing meat, and difficulty breathing when sleeping. She also noted improved abdominal pain/tightness.   Patient was most recently seen by [facility] on 10/04/2023 and reported continued use of walker and wheelchair for ambulation, continued lower extremity edema, wound on left anterior leg secondary to legs weeping, new onset of intermittent right flank/lower quadrant abdominal pain, and also noted excessive dizziness from use of Tramadol  50 MG, which she stopped.   She presented to the ED on 10/06/2023 with chest pain and SOB. Patient found to have NSTEMI c/w ADHF, Acute hypoxic respiratory failure secondary to flash pulmonary edema, and acute systolic heart failure with LVEF 25-30%  She presents in a wheelchair and is accompanied  by her son during today's visit. An additional family member is also on call during today's visit.   She denies any fever, chills, or back pain. Patient continues to have abdominal pain.   Patient is  noted to have lost 13 pounds since 10/16/2023 with current weight of 114 pounds, which is primarily a function of fluid status. She complains that her feet remain swollen. Her leg swelling is noted to be mild in clinic today.   She prefers to take some time to discuss with her family whether she is inclined to continue treatment or to focus on comfort care with hospice.   MEDICAL HISTORY:  Past Medical History:  Diagnosis Date   CLL (chronic lymphocytic leukemia) (HCC)    Dyspnea 2021   with exersion    Early cataracts, bilateral    Elevated hemoglobin A1c    Fibroids    GERD (gastroesophageal reflux disease)    Hyperlipidemia    Hypertension    Lymphoma (HCC)    Osteopenia     SURGICAL HISTORY: Past Surgical History:  Procedure Laterality Date   ABDOMINAL HYSTERECTOMY     APPENDECTOMY     BALLOON DILATION N/A 08/31/2023   Procedure: BALLOON DILATION;  Surgeon: Charlanne Groom, MD;  Location: [facility];  Service: Gastroenterology;  Laterality: N/A;   CATARACT EXTRACTION, BILATERAL Bilateral 09/2017   Dr. 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   TONSILLECTOMY AND ADENOIDECTOMY      SOCIAL HISTORY: Social History   Socioeconomic History   Marital status: Widowed    Spouse name: Not on file   Number of children: 3   Years of education: Not on file   Highest education level: Not on file  Occupational History   Occupation: retired  Tobacco Use   Smoking status: Never   Smokeless tobacco: Never  Vaping Use   Vaping status: Never Used  Substance and Sexual Activity   Alcohol use: No   Drug use: Never   Sexual activity: Not on file  Other Topics Concern   Not on file  Social History Narrative   Not on file   Social  Drivers of Health   Financial Resource Strain: Low Risk  (10/11/2023)   Overall Financial Resource Strain (CARDIA)    Difficulty of Paying Living Expenses: Not very hard  Food Insecurity: No Food Insecurity (10/06/2023)   Hunger Vital Sign    Worried About Running Out of Food in the Last Year: Never true    Ran Out of Food in the Last Year: Never true  Transportation Needs: No Transportation Needs (10/11/2023)   PRAPARE - Administrator, Civil Service (Medical): No    Lack of Transportation (Non-Medical): No  Physical Activity: Not on file  Stress: Not on file  Social Connections: Moderately Integrated (10/06/2023)   Social Connection and Isolation Panel    Frequency of Communication with Friends and Family: More than three times a week    Frequency of Social Gatherings with Friends and Family: Three times a week    Attends Religious Services: More than 4 times per year    Active Member of Clubs or Organizations: Not on file    Attends Club or Organization Meetings: More than 4 times per year    Marital  Status: Widowed  Intimate Partner Violence: Patient Unable To Answer (10/06/2023)   Humiliation, Afraid, Rape, and Kick questionnaire    Fear of Current or Ex-Partner: Patient unable to answer    Emotionally Abused: Patient unable to answer    Physically Abused: Patient unable to answer    Sexually Abused: Patient unable to answer    FAMILY HISTORY: Family History  Problem Relation Age of Onset   Leukemia Mother    Hypertension Father    Diabetes Sister    Heart disease Brother    Diabetes Brother    Diabetes Brother    Hypertension Son    Heart disease Son     ALLERGIES:  is allergic to peanut butter flavoring agent (non-screening) and nexium [esomeprazole magnesium ].  MEDICATIONS:  Current Outpatient Medications  Medication Sig Dispense Refill   aspirin  81 MG chewable tablet Chew 1 tablet (81 mg total) by mouth daily. 90 tablet 0   empagliflozin  (JARDIANCE ) 10 MG  TABS tablet Take 1 tablet (10 mg total) by mouth daily before breakfast. 30 tablet 5   entecavir  (BARACLUDE ) 0.5 MG tablet Take 1 tablet (0.5 mg total) by mouth every 3 (three) days. 10 tablet 2   pantoprazole  (PROTONIX ) 40 MG tablet Take 1 tablet (40 mg total) by mouth 2 (two) times daily for 56 days, THEN 1 tablet (40 mg total) daily. 142 tablet 0   polyethylene glycol (MIRALAX  / GLYCOLAX ) 17 g packet Take 17 g by mouth daily as needed for mild constipation or moderate constipation. Also available OTC 30 each 0   prochlorperazine  (COMPAZINE ) 10 MG tablet Take 1 tablet (10 mg total) by mouth every 6 (six) hours as needed for nausea or vomiting. 30 tablet 6   traMADol  (ULTRAM ) 50 MG tablet Take 1 tablet (50 mg total) by mouth every 8 (eight) hours as needed for moderate pain (pain score 4-6) or severe pain (pain score 7-10) (pain). 20 tablet 0   torsemide  (DEMADEX ) 20 MG tablet Take 0.5 tablets (10 mg total) by mouth daily. 90 tablet 1   No current facility-administered medications for this visit.   Facility-Administered Medications Ordered in Other Visits  Medication Dose Route Frequency Provider Last Rate Last Admin   pegfilgrastim -cbqv (UDENYCA ) injection 6 mg  6 mg Subcutaneous Once Jaramie Bastos Kishore, MD        REVIEW OF SYSTEMS:    10 Point review of Systems was done is negative except as noted above.   PHYSICAL EXAMINATION: ECOG PERFORMANCE STATUS: 2 - Symptomatic, <50% confined to bed  . Vitals:   10/25/23 1050 10/25/23 1056  BP: (!) 146/77 (!) 144/77  Pulse: 95   Resp: 18   Temp: (!) 97.3 F (36.3 C)   SpO2: 100%      Filed Weights   10/25/23 1050  Weight: 114 lb 12.8 oz (52.1 kg)    .Body mass index is 23.19 kg/m.   GENERAL:alert, in no acute distress and comfortable SKIN: no acute rashes, no significant lesions EYES: conjunctiva are pink and non-injected, sclera anicteric OROPHARYNX: MMM, no exudates, no oropharyngeal erythema or ulceration NECK: supple, no  JVD LYMPH:  no palpable lymphadenopathy in the cervical, axillary or inguinal regions LUNGS: clear to auscultation b/l with normal respiratory effort HEART: regular rate & rhythm ABDOMEN:  normoactive bowel sounds , non tender, not distended. Extremity: no pedal edema PSYCH: alert & oriented x 3 with fluent speech NEURO: no focal motor/sensory deficits   LABORATORY DATA:  I have reviewed the data as listed  .  Latest Ref Rng & Units 10/25/2023   10:20 AM 10/12/2023    3:25 AM 10/11/2023    3:25 AM  CBC  WBC 4.0 - 10.5 K/uL 11.6  2.4  4.9   Hemoglobin 12.0 - 15.0 g/dL 8.7  8.2  9.0   Hematocrit 36.0 - 46.0 % 30.8  25.8  28.0   Platelets 150 - 400 K/uL 188  158  166    .    Latest Ref Rng & Units 10/25/2023   10:20 AM 10/12/2023    3:25 AM 10/11/2023    3:25 AM  CMP  Glucose 70 - 99 mg/dL 895  92  896   BUN 8 - 23 mg/dL 44  42  41   Creatinine 0.44 - 1.00 mg/dL 7.85  8.25  8.32   Sodium 135 - 145 mmol/L 135  135  134   Potassium 3.5 - 5.1 mmol/L 3.4  3.5  3.5   Chloride 98 - 111 mmol/L 93  96  99   CO2 22 - 32 mmol/L 31  27  25    Calcium 8.9 - 10.3 mg/dL 9.1  8.5  8.2   Total Protein 6.5 - 8.1 g/dL 6.5     Total Bilirubin 0.0 - 1.2 mg/dL 0.9     Alkaline Phos 38 - 126 U/L 98     AST 15 - 41 U/L 14     ALT 0 - 44 U/L 9      . Lab Results  Component Value Date   LDH 624 (H) 08/28/2023   Component     Latest Ref Rng 08/18/2023  HBV DNA SERPL PCR-ACNC     IU/mL 110   HBV DNA SERPL PCR-LOG IU     log10 IU/mL 2.041   Test Info: Comment   Hepatitis A AB,Total     NON-REACTIVE  REACTIVE !   HCV Ab     NON REACTIVE  NON REACTIVE   HEP B CORE AB     Negative  Positive !   Hepatitis B Surface Ag     NON REACTIVE  Reactive !   Uric Acid, Serum     2.5 - 7.1 mg/dL     Legend: ! Abnormal   PATHOLOGY Surgical Pathology CASE: [id number] PATIENT: Emanuela Sharpnack Bone Marrow Report     Clinical History: lymphocytosis, possible lymphoma     DIAGNOSIS:  BONE  MARROW, ASPIRATE, CLOT, CORE: -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  The latter is primarily seen by flow cytometry.  The findings favor mantle cell lymphoma but correlation with cytogenetic and FISH studies is recommended.  MICROSCOPIC DESCRIPTION:  PERIPHERAL BLOOD SMEAR: The red blood cells display prominent anisocytosis with macrocytic and normocytic cells.  There is mild to moderate poikilocytosis with teardrop cells, elliptocytes.  There is moderate polychromasia.  The white blood cells are increased in number with lymphocytosis characterized by predominance of small to medium sized lymphoid cells displaying high nuclear cytoplasmic ratio, round to irregular nuclei, coarse chromatin and small to inconspicuous nucleoli. The platelets are normal in number.     RADIOGRAPHIC STUDIES: I have personally reviewed the radiological images as listed and agreed with the findings in the report. 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 EXAM: NUCLEAR MEDICINE PET SKULL BASE TO THIGH TECHNIQUE: 6.3 mCi F-18 FDG was injected intravenously. Full-ring PET imaging was performed from the skull base to thigh after the radiotracer. CT data was obtained and used for attenuation correction and anatomic localization. Fasting blood glucose: 88 mg/dl COMPARISON:  5/85/7974 FINDINGS: Mediastinal blood pool activity: SUV max 2.2 Liver activity: SUV max 3.3 NECK: No significant abnormal hypermetabolic activity in this region. Incidental CT findings: None. CHEST: No significant  abnormal hypermetabolic activity in this region. Incidental CT findings: Right Port-A-Cath tip: Lower right atrium. Mild cardiomegaly. Trace pericardial effusion. Atheromatous vascular calcification in the thoracic aorta. 4 mm left lower lobe nodule on image 65 series 4 previously shown to be calcified hence likely benign. 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 Anterior components of this process has a maximum SUV of 21.8 (Deauville 5), previously 27.0. As before, this process fills the perirenal space extending into the retroperitoneum root of the mesentery region and tracking down towards the right pelvic sidewall. The degree of distal periureteral high activity is reduced. Newly hypermetabolic right pelvic sidewall lymph node measures 0.7 cm in short axis on image 146 series 4, maximum SUV 9.2 (Deauville 5). This previously measured 0.6 cm on the PET-CT from 07/17/2023, with maximum SUV 1.5. Right inguinal node measuring 0.7 cm in short axis on image 151 series 4 with maximum SUV 5.1 (Deauville 4), previously 0.7 cm in diameter with maximum SUV 2.4. Focal abnormal hypermetabolic activity along the anterior dome of the right hepatic lobe capsular margin, maximum SUV 8.5 (Deauville 5). The associated hypermetabolic activity measures approximately 1.3 cm in diameter. This could be along the diaphragm or hepatic capsule rather than necessarily being intrahepatic. Incidental CT findings: Atherosclerosis is present, including aortoiliac atherosclerotic disease. Mild ascites. Prominent stool throughout the colon favors constipation. Diffuse subcutaneous edema. SKELETON: No significant abnormal hypermetabolic activity in this region. Incidental CT findings: None. IMPRESSION: 1. Large right perinephric, periureteral, and retroperitoneal mass with Deauville 5 activity again observed with severe associated  distortion of the right kidney. The degree of distal periureteral high activity is reduced. 2. Newly hypermetabolic right pelvic sidewall lymph node and right inguinal lymph node, both Deauville 5. 3. Focal abnormal hypermetabolic activity along the anterior dome of the right hepatic lobe capsular margin, Deauville 5. 4. Mild ascites. Diffuse subcutaneous edema. 5. Prominent stool throughout the colon favors constipation. 6.  Aortic Atherosclerosis (ICD10-I70.0). Electronically Signed   By: Ryan Salvage M.D.   On: 10/25/2023 06:57   ECHOCARDIOGRAM LIMITED Result Date: 10/06/2023    ECHOCARDIOGRAM LIMITED REPORT   Patient Name:   Jacqueline Hancock Date of Exam: 10/06/2023 Medical Rec #:  [id number]   Height:       59.0 in Accession #:    [id number]  Weight:       146.2 lb Date of Birth:  [birth year]   BSA:          1.614 m Patient Age:    83 years    BP:           187/68 mmHg Patient Gender: F           HR:           87 bpm. Exam Location:  Inpatient Procedure: Limited Echo (Both Spectral and Color Flow Doppler were utilized  during procedure). Indications:    Flash pulmonary edema (HCC)  History:        Patient has prior history of Echocardiogram examinations, most                 recent 08/22/2023. Signs/Symptoms:Dyspnea; Risk                 Factors:Hypertension.  Sonographer:    Jayson Gaskins Referring Phys: [phone] WHITNEY D HARRIS IMPRESSIONS  1. Limited Echocardiogram.  2. The left ventricle demonstrates both global and regional wall motion abnormalities (see scoring diagram/findings for description). The apex is akinetic. The entire lateral wall, anterior septum, and basal inferior segment are hypokinetic. The inferior septum and mid and distal inferior wall are normal. . Left ventricular ejection fraction, by estimation, is 25 to 30%. The left ventricle has severely decreased function. Left ventricular diastolic function could not be evaluated.  3. The mitral valve is grossly normal.  4. The aortic valve  was not well visualized. Aortic valve regurgitation is not visualized. Aortic valve sclerosis is present, with no evidence of aortic valve stenosis.  5. The inferior vena cava is normal in size with greater than 50% respiratory variability, suggesting right atrial pressure of 3 mmHg. Comparison(s): A prior study was performed on 08/22/2023. LVEF has decreased (from 60-65% to 25-30%). FINDINGS  Left Ventricle: The left ventricle demonstrates both global and regional wall motion abnormalities (see scoring diagram/findings for description). The apex is akinetic. The entire lateral wall, anterior septum, and basal inferior segment are hypokinetic. The inferior septum and mid and distal inferior wall are normal. Left ventricular ejection fraction, by estimation, is 25 to 30%. The left ventricle has severely decreased function. The left ventricular internal cavity size was normal in size. There is no left ventricular hypertrophy. Left ventricular diastolic function could not be evaluated. Left ventricular diastolic function could not be evaluated due to nondiagnostic images.  LV Wall Scoring: The apex is akinetic. The entire lateral wall, anterior septum, and basal inferior segment are hypokinetic. The inferior septum and mid and distal inferior wall are normal. Pericardium: There is no evidence of pericardial effusion. Mitral Valve: The mitral valve is grossly normal. Tricuspid Valve: The tricuspid valve is grossly normal. Aortic Valve: The aortic valve was not well visualized. Aortic valve regurgitation is not visualized. Aortic valve sclerosis is present, with no evidence of aortic valve stenosis. Venous: The inferior vena cava is normal in size with greater than 50% respiratory variability, suggesting right atrial pressure of 3 mmHg. LEFT VENTRICLE PLAX 2D LVIDd:         4.40 cm LVIDs:         4.00 cm LV PW:         0.90 cm LV IVS:        0.90 cm LVOT diam:     1.70 cm LVOT Area:     2.27 cm  LEFT ATRIUM            Index        RIGHT ATRIUM          Index LA Vol (A4C): 33.4 ml 20.69 ml/m  RA Area:     5.84 cm                                    RA Volume:   7.51 ml  4.65 ml/m   SHUNTS Systemic Diam: 1.70 cm [employer]  signed by Madonna Large Signature Date/Time: 10/06/2023/4:27:49 PM    Final    DG Chest Portable 1 View Result Date: 10/06/2023 CLINICAL DATA:  83 year old female intubated, enteric tube placement. EXAM: PORTABLE CHEST 1 VIEW COMPARISON:  0426 hours today, CTA chest today 10/06/2023. FINDINGS: Portable AP supine view at 0655 hours. Rotated to the right now. Endotracheal tube tip in good position between the clavicles and carina. Enteric tube courses to the left abdomen, tip not included. Stable right chest power port. Mildly lower lung volumes. Stable cardiac size and mediastinal contours. Increased mid and lower lung vague and interstitial opacity, symmetric. No pneumothorax or pleural effusion identified on this supine view. Negative visible bowel gas. Stable visualized osseous structures. IMPRESSION: 1. Satisfactory ET tube and Enteric tube. 2. Worsening symmetric perihilar and lower lung vague and interstitial opacity, CTA appearance today suggesting acute pulmonary edema (please see that report). Electronically Signed   By: VEAR Hurst M.D.   On: 10/06/2023 07:26   DG Chest Port 1 View Result Date: 10/06/2023 CLINICAL DATA:  Increasing central chest pain with shortness of breath. EXAM: PORTABLE CHEST 1 VIEW COMPARISON:  08/28/2023 FINDINGS: Cardiopericardial silhouette is at upper limits of normal for size. There is pulmonary vascular congestion without overt pulmonary edema. Component of interstitial pulmonary edema suspected. Tiny right pleural effusion evident. Right Port-A-Cath remains in place. IMPRESSION: Pulmonary vascular congestion with component of interstitial pulmonary edema and tiny right pleural effusion. Electronically Signed   By: Camellia Candle M.D.   On: 10/06/2023 06:17   CT  Angio Chest Pulmonary Embolism (PE) W or WO Contrast Result Date: 10/06/2023 CLINICAL DATA:  Bowel obstruction suspected PE suspected, respiratory distress, query worsening tumor burden lymphoma * Tracking Code: BO * EXAM: CT ANGIOGRAPHY CHEST CT ABDOMEN AND PELVIS WITH CONTRAST TECHNIQUE: Multidetector CT imaging of the chest was performed using the standard protocol during bolus administration of intravenous contrast. Multiplanar CT image reconstructions and MIPs were obtained to evaluate the vascular anatomy. Multidetector CT imaging of the abdomen and pelvis was performed using the standard protocol during bolus administration of intravenous contrast. RADIATION DOSE REDUCTION: This exam was performed according to the departmental dose-optimization program which includes automated exposure control, adjustment of the mA and/or kV according to patient size and/or use of iterative reconstruction technique. CONTRAST:  75mL OMNIPAQUE  IOHEXOL  350 MG/ML SOLN COMPARISON:  CT abdomen pelvis, 08/28/2023 PET-CT, 07/17/2018 FINDINGS: CT CHEST ANGIOGRAM FINDINGS Cardiovascular: Satisfactory opacification of the pulmonary arteries to the segmental level. No evidence of pulmonary embolism. Normal heart size. No pericardial effusion. Right chest port catheter. Aortic atherosclerosis. Mediastinum/Nodes: No enlarged mediastinal, hilar, or axillary lymph nodes. Thyroid  gland, trachea, and esophagus demonstrate no significant findings. Lungs/Pleura: Diffuse bilateral bronchial wall thickening, interlobular septal thickening, diffusely scattered ground-glass airspace opacity and small bilateral pleural effusions. Musculoskeletal: No chest wall abnormality. No acute osseous findings. Review of the MIP images confirms the above findings. CT ABDOMEN PELVIS FINDINGS Hepatobiliary: No solid liver abnormality is seen. No gallstones, gallbladder wall thickening, or biliary dilatation. Pancreas: Unremarkable. No pancreatic ductal dilatation  or surrounding inflammatory changes. Spleen: Normal in size without significant abnormality. Adrenals/Urinary Tract: Adrenal glands are unremarkable. Extremely bulky soft tissue mass again encases the right kidney, not significantly changed, measuring 12.9 x 10.0 cm (series 4, image 44). Dilated right renal calices with overt effacement of the right renal pelvis. Normal left kidney. Bladder is unremarkable. Stomach/Bowel: Stomach is within normal limits. Appendix appears normal. No evidence of bowel wall thickening, distention, or inflammatory changes. Vascular/Lymphatic:  Aortic atherosclerosis. No discretely enlarged abdominal or pelvic lymph nodes. Reproductive: No mass or other significant abnormality. Other: No abdominal wall hernia. Severe anasarca. Small volume ascites. Musculoskeletal: No acute or significant osseous findings. IMPRESSION: 1. Negative examination for pulmonary embolism. 2. Diffuse bilateral bronchial wall thickening, interlobular septal thickening, diffusely scattered ground-glass airspace opacity and small bilateral pleural effusions. Findings consistent with pulmonary edema. Drug toxicity is a differential consideration in the setting of chemotherapy. 3. Extremely bulky soft tissue mass again encases the right kidney, not significantly changed, measuring 12.9 x 10.0 cm. Dilated right renal calices with overt effacement of the right renal pelvis. 4. Severe anasarca.  Small volume ascites. Aortic Atherosclerosis (ICD10-I70.0). Electronically Signed   By: Marolyn JONETTA Jaksch M.D.   On: 10/06/2023 05:52   CT ABDOMEN PELVIS W CONTRAST Result Date: 10/06/2023 CLINICAL DATA:  Bowel obstruction suspected PE suspected, respiratory distress, query worsening tumor burden lymphoma * Tracking Code: BO * EXAM: CT ANGIOGRAPHY CHEST CT ABDOMEN AND PELVIS WITH CONTRAST TECHNIQUE: Multidetector CT imaging of the chest was performed using the standard protocol during bolus administration of intravenous contrast.  Multiplanar CT image reconstructions and MIPs were obtained to evaluate the vascular anatomy. Multidetector CT imaging of the abdomen and pelvis was performed using the standard protocol during bolus administration of intravenous contrast. RADIATION DOSE REDUCTION: This exam was performed according to the departmental dose-optimization program which includes automated exposure control, adjustment of the mA and/or kV according to patient size and/or use of iterative reconstruction technique. CONTRAST:  75mL OMNIPAQUE  IOHEXOL  350 MG/ML SOLN COMPARISON:  CT abdomen pelvis, 08/28/2023 PET-CT, 07/17/2018 FINDINGS: CT CHEST ANGIOGRAM FINDINGS Cardiovascular: Satisfactory opacification of the pulmonary arteries to the segmental level. No evidence of pulmonary embolism. Normal heart size. No pericardial effusion. Right chest port catheter. Aortic atherosclerosis. Mediastinum/Nodes: No enlarged mediastinal, hilar, or axillary lymph nodes. Thyroid  gland, trachea, and esophagus demonstrate no significant findings. Lungs/Pleura: Diffuse bilateral bronchial wall thickening, interlobular septal thickening, diffusely scattered ground-glass airspace opacity and small bilateral pleural effusions. Musculoskeletal: No chest wall abnormality. No acute osseous findings. Review of the MIP images confirms the above findings. CT ABDOMEN PELVIS FINDINGS Hepatobiliary: No solid liver abnormality is seen. No gallstones, gallbladder wall thickening, or biliary dilatation. Pancreas: Unremarkable. No pancreatic ductal dilatation or surrounding inflammatory changes. Spleen: Normal in size without significant abnormality. Adrenals/Urinary Tract: Adrenal glands are unremarkable. Extremely bulky soft tissue mass again encases the right kidney, not significantly changed, measuring 12.9 x 10.0 cm (series 4, image 44). Dilated right renal calices with overt effacement of the right renal pelvis. Normal left kidney. Bladder is unremarkable.  Stomach/Bowel: Stomach is within normal limits. Appendix appears normal. No evidence of bowel wall thickening, distention, or inflammatory changes. Vascular/Lymphatic: Aortic atherosclerosis. No discretely enlarged abdominal or pelvic lymph nodes. Reproductive: No mass or other significant abnormality. Other: No abdominal wall hernia. Severe anasarca. Small volume ascites. Musculoskeletal: No acute or significant osseous findings. IMPRESSION: 1. Negative examination for pulmonary embolism. 2. Diffuse bilateral bronchial wall thickening, interlobular septal thickening, diffusely scattered ground-glass airspace opacity and small bilateral pleural effusions. Findings consistent with pulmonary edema. Drug toxicity is a differential consideration in the setting of chemotherapy. 3. Extremely bulky soft tissue mass again encases the right kidney, not significantly changed, measuring 12.9 x 10.0 cm. Dilated right renal calices with overt effacement of the right renal pelvis. 4. Severe anasarca.  Small volume ascites. Aortic Atherosclerosis (ICD10-I70.0). Electronically Signed   By: Marolyn JONETTA Jaksch M.D.   On: 10/06/2023 05:52    ASSESSMENT &  PLAN:   83 y.o. woman with:   1.  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 Though discussed with patient that FISH was neg and so Mantle cell lymphoma not confirm. Findings more consistent with CLL.  2.  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  3.  High risk of tumor lysis syndrome  4.  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  5) Hypercalcemia - ? From dehydration vs recurrent lymphoma  6) hepatitis B chronic active disease  PLAN:  -Discussed lab results on 10/25/2023 in detail with patient. CBC showed WBC of 11.6K, hemoglobin of 8.7, and  platelets of 188K. -10/19/2023 PET scan did not show as much response as we would expect. We discussed that there is partial response in the main tumor surrounding the kidney in the abdm. There is not much disease outside of the abdomen. The main mass in the right abdm is less active, though there are a couple of new areas of disease which are not large but are more active.   -discussed that we would generally expect at least 70% response after 2-3 cycles, which is not the case -discussed that based on the degree of her response, her disease is showing resistance patterns  -10/06/2023 echocardiogram showed EF 25-30%, which is about half of normal, and thought to be due to lack of blood supply to the heart -patient noted to have several medical issues which are limiting, including ischemic heart disease, new NSTEMI, chronic active hepatitis B, and very weakened heart with EF 25-30%, as well as nutritional status and age -discussed that her heart changes are not due to chemotherapy given that the weakness is not affecting the entire heart.  -discussed that her acute heart failure thought to be due to lack of blood supply could have also been triggered by stress from pneumonia.  -we discussed that her fluid status can also play into her heart failure.  -we discussed that she likely has Corporate treasurer transformation, where her previously well-behaved CLL has transformed to a very aggressive lymphoma due to genetic mutations, and to try to control it, there would be a need for generally high-impact medication. We discussed that we absolutely cannot treat her with full-dose treatment, and if we were do adjust treatment, there is concern that her tumor likely would not respond very quickly and the risk of complications would be very high.  -had an honest discussion with patient, and discussed that even with adjusted dosing of treatment, she may not be able to reach complete remission after 6 cycles and there would be a  high risk of complications with treatment -we discussed that there is a very high risk she will become hospitalized if she chooses to proceed with treatment, potentially due to fluid status, stress on the heart, high risk of decompensating the heart again, dropping blood counts, and infections -discussed option of hospice to focus on comfort care if she chooses to -discussed option to continue to treat her. Discussed that treatment would be used for palliation and not for long-term control  -if she decides to continue treatment, we will stop anthracycline due to its cardio toxicities, and will switch to Etoposide  -discussed that Etoposide would be a daily medication for 3 days every 3 weeks.  -discussed option to consult with a radiation oncologist to see if they  can do palliative radiation therapy in a reasonable fashion then considering systemic therapies afterwards. Discussed that local palliative radiation would be considered to help delay symptoms from cancer and would not be curative.  -do not think that patient would be able to tolerate intensive treatment that would be required for cure and do not think that patient would be a candidate for a bone marrow transplant.  -discussed that I am unsure if she would be a candidate for CAR-T cell therapy, though I would be happy to place referral to an academic center such as [facility] or [facility] if she chooses to for an additional opinion -patient is inclined to take some time to discuss with her family prior to making a proceeding decision -we will hold R-miniCHOP treatment today -ordered entecavir  0.5 MG for her chronic active hepatitis B as it will need to be continued -answered all of patient's and her family member's questions in detail  FOLLOW-UP: Referral to radiation oncology to consider palliative RT for symptom DLBCL in rt retroperitoneum RTC with Dr Onesimo with portflush and labs in 2 weeks  The total time spent in the appointment was 30  minutes* .  All of the patient's questions were answered with apparent satisfaction. The patient knows to call the clinic with any problems, questions or concerns.   Emaline Onesimo MD MS AAHIVMS [facility] Hematology/Oncology Physician [facility]  .*Total Encounter Time as defined by the Centers for Medicare and Medicaid Services includes, in addition to the face-to-face time of a patient visit (documented in the note above) non-face-to-face time: obtaining and reviewing outside history, ordering and reviewing medications, tests or procedures, care coordination (communications with other health care professionals or caregivers) and documentation in the medical record.    I,Mitra Faeizi,acting as a Neurosurgeon for Emaline Onesimo, MD.,have documented all relevant documentation on the behalf of Emaline Onesimo, MD,as directed by  Emaline Onesimo, MD while in the presence of Emaline Onesimo, MD.  .I have reviewed the above documentation for accuracy and completeness, and I agree with the above. Emaline Candida Onesimo MD

## 2023-10-24 NOTE — Telephone Encounter (Signed)
 Please schedule in office appointment to check weight.

## 2023-10-24 NOTE — Telephone Encounter (Signed)
 No appointments available this week in person. Please advise

## 2023-10-24 NOTE — Telephone Encounter (Signed)
 Mychart message sent to patient and/or family with provider response

## 2023-10-25 ENCOUNTER — Ambulatory Visit: Admitting: Physician Assistant

## 2023-10-25 ENCOUNTER — Other Ambulatory Visit (HOSPITAL_COMMUNITY): Payer: Self-pay

## 2023-10-25 ENCOUNTER — Inpatient Hospital Stay

## 2023-10-25 ENCOUNTER — Inpatient Hospital Stay: Admitting: Hematology

## 2023-10-25 ENCOUNTER — Other Ambulatory Visit: Payer: Self-pay

## 2023-10-25 VITALS — BP 144/77 | HR 95 | Temp 97.3°F | Resp 18 | Wt 114.8 lb

## 2023-10-25 DIAGNOSIS — D591 Autoimmune hemolytic anemia, unspecified: Secondary | ICD-10-CM | POA: Insufficient documentation

## 2023-10-25 DIAGNOSIS — Z7189 Other specified counseling: Secondary | ICD-10-CM

## 2023-10-25 DIAGNOSIS — B181 Chronic viral hepatitis B without delta-agent: Secondary | ICD-10-CM | POA: Diagnosis not present

## 2023-10-25 DIAGNOSIS — R1031 Right lower quadrant pain: Secondary | ICD-10-CM | POA: Insufficient documentation

## 2023-10-25 DIAGNOSIS — C8338 Diffuse large B-cell lymphoma, lymph nodes of multiple sites: Secondary | ICD-10-CM | POA: Diagnosis not present

## 2023-10-25 DIAGNOSIS — C9110 Chronic lymphocytic leukemia of B-cell type not having achieved remission: Secondary | ICD-10-CM | POA: Insufficient documentation

## 2023-10-25 DIAGNOSIS — Z806 Family history of leukemia: Secondary | ICD-10-CM | POA: Diagnosis not present

## 2023-10-25 DIAGNOSIS — Z95828 Presence of other vascular implants and grafts: Secondary | ICD-10-CM

## 2023-10-25 DIAGNOSIS — Z79899 Other long term (current) drug therapy: Secondary | ICD-10-CM | POA: Insufficient documentation

## 2023-10-25 DIAGNOSIS — Z5111 Encounter for antineoplastic chemotherapy: Secondary | ICD-10-CM

## 2023-10-25 DIAGNOSIS — I5022 Chronic systolic (congestive) heart failure: Secondary | ICD-10-CM

## 2023-10-25 LAB — CMP (CANCER CENTER ONLY)
ALT: 9 U/L (ref 0–44)
AST: 14 U/L — ABNORMAL LOW (ref 15–41)
Albumin: 3.7 g/dL (ref 3.5–5.0)
Alkaline Phosphatase: 98 U/L (ref 38–126)
Anion gap: 11 (ref 5–15)
BUN: 44 mg/dL — ABNORMAL HIGH (ref 8–23)
CO2: 31 mmol/L (ref 22–32)
Calcium: 9.1 mg/dL (ref 8.9–10.3)
Chloride: 93 mmol/L — ABNORMAL LOW (ref 98–111)
Creatinine: 2.14 mg/dL — ABNORMAL HIGH (ref 0.44–1.00)
GFR, Estimated: 22 mL/min — ABNORMAL LOW (ref >60)
Glucose, Bld: 104 mg/dL — ABNORMAL HIGH (ref 70–99)
Potassium: 3.4 mmol/L — ABNORMAL LOW (ref 3.5–5.1)
Sodium: 135 mmol/L (ref 135–145)
Total Bilirubin: 0.9 mg/dL (ref 0.0–1.2)
Total Protein: 6.5 g/dL (ref 6.5–8.1)

## 2023-10-25 LAB — CBC WITH DIFFERENTIAL (CANCER CENTER ONLY)
Abs Immature Granulocytes: 0.05 K/uL (ref 0.00–0.07)
Basophils Absolute: 0 K/uL (ref 0.0–0.1)
Basophils Relative: 0 %
Eosinophils Absolute: 0 K/uL (ref 0.0–0.5)
Eosinophils Relative: 0 %
HCT: 30.8 % — ABNORMAL LOW (ref 36.0–46.0)
Hemoglobin: 8.7 g/dL — ABNORMAL LOW (ref 12.0–15.0)
Immature Granulocytes: 0 %
Lymphocytes Relative: 10 %
Lymphs Abs: 1.1 K/uL (ref 0.7–4.0)
MCH: 29.1 pg (ref 26.0–34.0)
MCHC: 28.2 g/dL — ABNORMAL LOW (ref 30.0–36.0)
MCV: 103 fL — ABNORMAL HIGH (ref 80.0–100.0)
Monocytes Absolute: 1.5 K/uL — ABNORMAL HIGH (ref 0.1–1.0)
Monocytes Relative: 13 %
Neutro Abs: 8.8 K/uL — ABNORMAL HIGH (ref 1.7–7.7)
Neutrophils Relative %: 77 %
Platelet Count: 188 K/uL (ref 150–400)
RBC: 2.99 MIL/uL — ABNORMAL LOW (ref 3.87–5.11)
WBC Count: 11.6 K/uL — ABNORMAL HIGH (ref 4.0–10.5)
nRBC: 0 % (ref 0.0–0.2)

## 2023-10-25 MED ORDER — SODIUM CHLORIDE 0.9% FLUSH
10.0000 mL | Freq: Once | INTRAVENOUS | Status: AC
Start: 2023-10-25 — End: 2023-10-25
  Administered 2023-10-25: 10 mL

## 2023-10-25 MED ORDER — ENTECAVIR 0.5 MG PO TABS
0.5000 mg | ORAL_TABLET | ORAL | 2 refills | Status: DC
  Filled 2023-10-25: qty 10, 30d supply, fill #0

## 2023-10-25 NOTE — Telephone Encounter (Signed)
 We have some openings today with Marval Seip, NP  Outgoing call placed to patient, no answer, left detailed message with details to call to schedule an appointment to follow-up on weight loss.

## 2023-10-27 ENCOUNTER — Telehealth (HOSPITAL_COMMUNITY): Payer: Self-pay

## 2023-10-27 ENCOUNTER — Inpatient Hospital Stay

## 2023-10-27 NOTE — Telephone Encounter (Signed)
 Called to confirm/remind patient of their appointment at the [facility] on 10/30/23.   Appointment:   [] Confirmed  [x] Left mess   [] No answer/No voice mail  [] VM Full/unable to leave message  [] Phone not in service  And to bring in all medications and/or complete list.

## 2023-10-30 ENCOUNTER — Ambulatory Visit (HOSPITAL_COMMUNITY)
Admission: RE | Admit: 2023-10-30 | Discharge: 2023-10-30 | Disposition: A | Discharge status: Home / Self Care | Source: Ambulatory Visit | Attending: Family Medicine

## 2023-10-30 ENCOUNTER — Encounter (HOSPITAL_COMMUNITY): Payer: Self-pay

## 2023-10-30 VITALS — BP 118/72 | HR 90 | Ht 59.0 in | Wt 114.0 lb

## 2023-10-30 DIAGNOSIS — R54 Age-related physical debility: Secondary | ICD-10-CM | POA: Insufficient documentation

## 2023-10-30 DIAGNOSIS — Z8249 Family history of ischemic heart disease and other diseases of the circulatory system: Secondary | ICD-10-CM | POA: Diagnosis not present

## 2023-10-30 DIAGNOSIS — C8330 Diffuse large B-cell lymphoma, unspecified site: Secondary | ICD-10-CM | POA: Insufficient documentation

## 2023-10-30 DIAGNOSIS — N183 Chronic kidney disease, stage 3 unspecified: Secondary | ICD-10-CM

## 2023-10-30 DIAGNOSIS — I130 Hypertensive heart and chronic kidney disease with heart failure and stage 1 through stage 4 chronic kidney disease, or unspecified chronic kidney disease: Secondary | ICD-10-CM | POA: Insufficient documentation

## 2023-10-30 DIAGNOSIS — I252 Old myocardial infarction: Secondary | ICD-10-CM | POA: Insufficient documentation

## 2023-10-30 DIAGNOSIS — N1832 Chronic kidney disease, stage 3b: Secondary | ICD-10-CM | POA: Diagnosis not present

## 2023-10-30 DIAGNOSIS — C9110 Chronic lymphocytic leukemia of B-cell type not having achieved remission: Secondary | ICD-10-CM | POA: Insufficient documentation

## 2023-10-30 DIAGNOSIS — D61818 Other pancytopenia: Secondary | ICD-10-CM | POA: Diagnosis not present

## 2023-10-30 DIAGNOSIS — I5022 Chronic systolic (congestive) heart failure: Secondary | ICD-10-CM | POA: Diagnosis not present

## 2023-10-30 MED ORDER — TORSEMIDE 20 MG PO TABS: 10.0000 mg | ORAL_TABLET | Freq: Every day | ORAL | 1 refills | Status: DC

## 2023-10-30 NOTE — Progress Notes (Signed)
 ReDS Vest / Clip - 10/30/23 1600       ReDS Vest / Clip   Station Marker A    Ruler Value 32    ReDS Value Range Low volume    ReDS Actual Value 23

## 2023-10-30 NOTE — Patient Instructions (Addendum)
 Good to see you today!  DECREASE torsemide  to 10 mg daily  PLEASE Wear you compression hose daily  Your physician recommends that you schedule a follow-up appointment  as scheduled  At the [facility], you and your health needs are our priority. As part of our continuing mission to provide you with exceptional heart care, we have created designated Provider Care Teams. These Care Teams include your primary Cardiologist (physician) and Advanced Practice Providers (APPs- Physician Assistants and Nurse Practitioners) who all work together to provide you with the care you need, when you need it.   You may see any of the following providers on your designated Care Team at your next follow up: Dr Toribio Fuel Dr Ezra Shuck Dr. Ria Commander Dr. Morene Brownie Amy Lenetta, NP Caffie Shed, [place] [facility] [place], [place] Beckey Coe, NP [place] Lee, NP Ellouise Class, NP Tinnie Redman, PharmD Jaun Bash, PharmD   Please be sure to bring in all your medications bottles to every appointment.    Thank you for choosing [facility]

## 2023-10-30 NOTE — Progress Notes (Signed)
 [facility] CONSULT NOTE   Primary Care: Ngetich, Roxan BROCKS, NP Primary Cardiologist: Lonni LITTIE [name], MD HF Cardiologist: Dr. Rolan  HPI: Jacqueline Hancock is an 83 y.o. 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   Follow up with hem-onc 10/04/23, plan for R-mini CHOP with adriamycin .   Admitted 7/25 with acute hypoxemic respiratory failure and volume overload. Required brief ICU stay 2/2 somnolence and flash pulmonary edema, requiring intubation.  Found to have new acute systolic heart failure. Echo showed EF 25-30%, previous EF  60-65% 2 months prior.  She was extubated. Found to have NSTEMI, however unable to undergo heart cath due to pancytopenia.  GDMT titrated, but limited due to hypotension and elevated renal function. She was discharged home, weight 127 lbs  Today she returns for HF follow up with her son, referred from [facility]. Overall feeling fine. She has fatigue when walking a lot, no SOB getting around the house or doing ADLs. Feet are swelling some but leg swelling has improved. She has compression hose at home. Her son lives with her. Denies palpitations, abnormal bleeding, CP, dizziness, or PND/Orthopnea. Appetite ok. Weight at home 114 pounds. Taking all medications. Saw Dr. Onesimo last week and chemo is on hold, offered comfort care vs continuing treatment, although risk would be high. She is thinking over her options.  ReDs reading: 23 %, abnormal  Labs (7/25): K 3.4, creatinine 2.14, hgb 8.7  Cardiac Testing  - Echo 7/25: LV with global and RWMA, LVEF 25-30%  - Echo 5/25: EF 60-65%  Past Medical History:  Diagnosis Date   CLL (chronic lymphocytic leukemia) (HCC)    Dyspnea 2021   with exersion    Early cataracts, bilateral    Elevated hemoglobin A1c    Fibroids    GERD (gastroesophageal reflux disease)    Hyperlipidemia    Hypertension    Lymphoma (HCC)     Osteopenia    Current Outpatient Medications  Medication Sig Dispense Refill   aspirin  81 MG chewable tablet Chew 1 tablet (81 mg total) by mouth daily. 90 tablet 0   empagliflozin  (JARDIANCE ) 10 MG TABS tablet Take 1 tablet (10 mg total) by mouth daily before breakfast. 30 tablet 5   entecavir  (BARACLUDE ) 0.5 MG tablet Take 1 tablet (0.5 mg total) by mouth every 3 (three) days. 10 tablet 2   pantoprazole  (PROTONIX ) 40 MG tablet Take 1 tablet (40 mg total) by mouth 2 (two) times daily for 56 days, THEN 1 tablet (40 mg total) daily. 142 tablet 0   polyethylene glycol (MIRALAX  / GLYCOLAX ) 17 g packet Take 17 g by mouth daily as needed for mild constipation or moderate constipation. Also available OTC 30 each 0   prochlorperazine  (COMPAZINE ) 10 MG tablet Take 1 tablet (10 mg total) by mouth every 6 (six) hours as needed for nausea or vomiting. 30 tablet 6   torsemide  (DEMADEX ) 20 MG tablet Take 1 tablet (20 mg total) by mouth daily. Take 2 tablets (40 mg total) once daily for 2 days, then return to 1 tablet (20 mg total) once daily thereafter. 90 tablet 1   traMADol  (ULTRAM ) 50 MG tablet Take 1 tablet (50 mg total) by mouth every 8 (eight) hours as needed for moderate pain (pain score 4-6) or severe pain (pain score 7-10) (pain). 20 tablet 0   No current facility-administered medications for this encounter.   Facility-Administered Medications Ordered  in Other Encounters  Medication Dose Route Frequency Provider Last Rate Last Admin   pegfilgrastim -cbqv (UDENYCA ) injection 6 mg  6 mg Subcutaneous Once Kale, Gautam Kishore, MD       Allergies  Allergen Reactions   Peanut Butter Flavoring Agent (Non-Screening) Itching   Nexium [Esomeprazole Magnesium ] Other (See Comments)    Headache   Social History   Socioeconomic History   Marital status: Widowed    Spouse name: Not on file   Number of children: 3   Years of education: Not on file   Highest education level: Not on file  Occupational  History   Occupation: retired  Tobacco Use   Smoking status: Never   Smokeless tobacco: Never  Vaping Use   Vaping status: Never Used  Substance and Sexual Activity   Alcohol use: No   Drug use: Never   Sexual activity: Not on file  Other Topics Concern   Not on file  Social History Narrative   Not on file   Social Drivers of Health   Financial Resource Strain: Low Risk  (10/11/2023)   Overall Financial Resource Strain (CARDIA)    Difficulty of Paying Living Expenses: Not very hard  Food Insecurity: No Food Insecurity (10/06/2023)   Hunger Vital Sign    Worried About Running Out of Food in the Last Year: Never true    Ran Out of Food in the Last Year: Never true  Transportation Needs: No Transportation Needs (10/11/2023)   PRAPARE - Administrator, Civil Service (Medical): No    Lack of Transportation (Non-Medical): No  Physical Activity: Not on file  Stress: Not on file  Social Connections: Moderately Integrated (10/06/2023)   Social Connection and Isolation Panel    Frequency of Communication with Friends and Family: More than three times a week    Frequency of Social Gatherings with Friends and Family: Three times a week    Attends Religious Services: More than 4 times per year    Active Member of Clubs or Organizations: Not on file    Attends Club or Organization Meetings: More than 4 times per year    Marital Status: Widowed  Intimate Partner Violence: Patient Unable To Answer (10/06/2023)   Humiliation, Afraid, Rape, and Kick questionnaire    Fear of Current or Ex-Partner: Patient unable to answer    Emotionally Abused: Patient unable to answer    Physically Abused: Patient unable to answer    Sexually Abused: Patient unable to answer    Family History  Problem Relation Age of Onset   Leukemia Mother    Hypertension Father    Diabetes Sister    Heart disease Brother    Diabetes Brother    Diabetes Brother    Hypertension Son    Heart disease Son    Wt  Readings from Last 3 Encounters:  10/30/23 51.7 kg (114 lb)  10/25/23 52.1 kg (114 lb 12.8 oz)  10/16/23 57.6 kg (127 lb)   BP 118/72   Pulse 90   Ht 4' 11 (1.499 m)   Wt 51.7 kg (114 lb)   SpO2 99%   BMI 23.03 kg/m   PHYSICAL EXAM: General:  NAD. No resp difficulty, arrived in [facility], elderly, frail HEENT: Normal Neck: Supple. No JVD. Cor: Regular rate & rhythm. No rubs, gallops or murmurs. Lungs: Clear, diminished in bases Abdomen: Soft, nontender, +distended.  Extremities: No cyanosis, clubbing, rash, 1+ BLE pre-tibial edema Neuro: Alert & oriented x 3, moves all 4  extremities w/o difficulty. Affect pleasant.  ASSESSMENT & PLAN: 1. Chronic Systolic Heart Failure: Newly diagnosed 10/2023. Echo 5/25 showed EF 60-65%. She has been on R-mini CHOP with adriamycin , s/p 3/5 cycles as of 10/04/23. Admitted 7/25 with new acute systolic heart failure and NSTEMI. Echo showed EF 25-30% with LV with global and RWMA. Unable to undergo LHC due to pancytopenia. Suspect iCM vs chemo induced DM. Drop in EF correlates with timing after starting adriamycin . Today, NYHA III, deconditioning and frailty contributing. She is not volume overloaded on exam, weight down 13 lbs, ReDS low at 23%. GDMT limited by CKD. - Decrease torsemide  to 10 mg daily. Recent labs showed SCr up to 2.14. Repeat BMET at follow up in 2 weeks. - Continue Jardiance  10 mg daily. - Continue compression hose daily. - Add spiro next, if renal function and BP stable. - Not a candidate for advanced therpies with co morbidities, frailty and age.  - Echo arranged in 6 weeks. 2. Hx of NSTEMI: HsTrop elevated during last admission. Unfortunately, unable to have ischemic eval with LHC due to pancytopenia.  She does not have chest pain. 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 Not candidate for nephrostomy tube per IR (during recent admission). Per  Urology, not candidate for stenting.  - Baseline SCr 1.4-1.6. Most recent SCr 2.14.  - Decrease loop diuretic, as above. 4. CLL; Large B-cell lymphoma: Was treated with R-mini CHOP with adriamycin . (Treatment 3/5). Recent PET suggests her disease is showing resistance patterns. Significant drop in EF after starting adriamycin . - Would hold off on further treatments and aggressively titrate GDMT as tolerated. Previously discussed with Dr. Rolan.  - Followed by Dr. Onesimo, Heme/Onc. - Echo arranged in 6 weeks.  Follow up with PharmD in 2 weeks (add spiro vs ARB if able) and Dr. Rolan in 6 weeks + echo, as scheduled.  Harlene Gainer, FNP-BC 10/30/23

## 2023-10-31 ENCOUNTER — Encounter: Payer: Self-pay | Admitting: Hematology

## 2023-11-01 ENCOUNTER — Encounter: Payer: Self-pay | Admitting: Internal Medicine

## 2023-11-01 ENCOUNTER — Ambulatory Visit: Payer: Self-pay | Admitting: Internal Medicine

## 2023-11-01 ENCOUNTER — Other Ambulatory Visit: Payer: Self-pay

## 2023-11-01 ENCOUNTER — Telehealth: Payer: Self-pay | Admitting: Radiation Oncology

## 2023-11-01 VITALS — BP 146/74 | HR 82 | Temp 98.2°F

## 2023-11-01 DIAGNOSIS — B181 Chronic viral hepatitis B without delta-agent: Secondary | ICD-10-CM | POA: Diagnosis not present

## 2023-11-01 MED ORDER — ENTECAVIR 0.5 MG PO TABS: 0.5000 mg | ORAL_TABLET | ORAL | 2 refills | Status: DC

## 2023-11-01 NOTE — Telephone Encounter (Signed)
 7/30 @ 11:41 am Called both patient's contact numbers, left voicemail for pt to call our office to be schedule for consult.

## 2023-11-03 ENCOUNTER — Other Ambulatory Visit: Payer: Self-pay

## 2023-11-03 NOTE — Progress Notes (Incomplete)
 ***In Progress***    Advanced Heart Failure Clinic Note   Primary Care: Ngetich, Roxan BROCKS, NP Primary Cardiologist: Lonni LITTIE [name], MD HF Cardiologist: Dr. Rolan  HPI:  Jacqueline Hancock is an 83 y.o. 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   Follow up with hem-onc 10/04/23, plan for R-mini CHOP with adriamycin .   Admitted 10/2023 with acute hypoxemic respiratory failure and volume overload. Required brief ICU stay 2/2 somnolence and flash pulmonary edema, requiring intubation.  Found to have new acute systolic heart failure. Echo showed EF 25-30%, previous EF  60-65% 2 months prior.  She was extubated. Found to have NSTEMI, however unable to undergo heart cath due to pancytopenia.  GDMT titrated, but limited due to hypotension and elevated renal function. She was discharged home, weight 127 lbs   Returned to [facility] for HF follow up with her son on 10/30/23, referred from [facility]. Overall was feeling fine. She had fatigue when walking a lot, no SOB getting around the house or doing ADLs. Feet were swelling some but leg swelling had improved. She had compression hose at home. Her son lives with her. Denied palpitations, abnormal bleeding, CP, dizziness, or PND/Orthopnea. Appetite was ok. Weight at home was 114 pounds. Reported taking all medications. Saw Dr. Onesimo the previous week and chemo is on hold, offered comfort care vs continuing treatment, although risk would be high. She was thinking over her options. ReDs reading was 23%, abnormal.  Today he returns to HF clinic for pharmacist medication titration. At last visit with APP, torsemide  was decreased to 10 mg daily.   Ed yesterday for abnormal labs (anemia( and also give Lsx 20 IV + KCL She has stable but chronic abdominal pain from this large mass that is known  Also got 2 u prbc  Shortness of breath/dyspnea on exertion? {YES NO:22349}   Orthopnea/PND? {YES NO:22349} Edema? {YES NO:22349} Lightheadedness/dizziness? {YES NO:22349} Daily weights at home? {YES NO:22349} Blood pressure/heart rate monitoring at home? {YES [id number] Following low-sodium/fluid-restricted diet? {YES NO:22349}  HF Medications: Jardiance  10 mg daily Torsemide  10 mg daily  Has the patient been experiencing any side effects to the medications prescribed?  {YES NO:22349}  Does the patient have any problems obtaining medications due to transportation or finances?   {YES NO:22349}  Understanding of regimen: {excellent/good/fair/poor:19665} Understanding of indications: {excellent/good/fair/poor:19665} Potential of compliance: {excellent/good/fair/poor:19665} Patient understands to avoid NSAIDs. Patient understands to avoid decongestants.    Pertinent Lab Values: Serum creatinine ***, BUN ***, Potassium ***, Sodium ***, BNP ***, Magnesium  ***, Digoxin ***   Vital Signs: Weight: *** (last clinic weight: ***) Blood pressure: ***  Heart rate: ***   Assessment/Plan: 1. Chronic Systolic Heart Failure: Newly diagnosed 10/2023. Echo 08/2023 showed EF 60-65%. She has been on R-mini CHOP with adriamycin , s/p 3/5 cycles as of 10/04/23. Admitted 10/2023 with new acute systolic heart failure and NSTEMI. Echo showed EF 25-30% with LV with global and RWMA. Unable to undergo LHC due to pancytopenia. Suspect iCM vs chemo induced DM. Drop in EF correlates with timing after starting adriamycin .  -NYHA III, deconditioning and frailty contributing. She is not volume overloaded on exam, weight down 13 lbs, ReDS low at 23%. GDMT limited by CKD. *** - Continue torsemide  10 mg daily. Recent labs showed SCr up to 2.14. Repeat BMET at follow up in 2 weeks.*** Continue compression hose daily. - Continue Jardiance  10 mg daily. - Add spiro  next, if renal function and BP stable. - Not a candidate for advanced therapies with comorbidities, frailty and age.  - Echo arranged in 6  weeks. 2. Hx of NSTEMI: HsTrop elevated during last admission. Unfortunately, unable to have ischemic eval with LHC due to pancytopenia.  She does not have chest pain. 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 Not candidate for nephrostomy tube per IR (during recent admission). Per Urology, not candidate for stenting.  - Baseline SCr 1.4-1.6. Most recent SCr 2.14.  - Decrease loop diuretic, as above. 4. CLL; Large B-cell lymphoma: Was treated with R-mini CHOP with adriamycin . (Treatment 3/5). Recent PET suggests her disease is showing resistance patterns. Significant drop in EF after starting adriamycin . - Would hold off on further treatments and aggressively titrate GDMT as tolerated. Previously discussed with Dr. Rolan.  - Followed by Dr. Onesimo, Heme/Onc. - Echo arranged in 6 weeks.  Follow up ***   Tinnie Redman, PharmD, BCPS, BCCP, CPP Heart Failure Clinic Pharmacist [phone]

## 2023-11-04 NOTE — Progress Notes (Signed)
 Patient ID: Jacqueline Hancock, female   DOB: [birth year], 83 y.o.   MRN: [id number]  HPI [name] is an 83yo F who B-cell lymphoproliferative disorder who was hospitalized in early July for congestive heart failure, pulmonary edema requiring intubation/extubation. She was found to have active chronic hepatitis B infection at that time. In anticipation to being started on chemotherapy, decision was made to start on entecavir  for reduced side effect profile, renally dosed. She has been discharged from the hospital cared by family, remains wheelchair dependent due to decrease mobility, trying to increase nutritional intake.  No issues with entecavir . Follows with dr onesimo  Outpatient Encounter Medications as of 11/01/2023  Medication Sig   aspirin  81 MG chewable tablet Chew 1 tablet (81 mg total) by mouth daily.   empagliflozin  (JARDIANCE ) 10 MG TABS tablet Take 1 tablet (10 mg total) by mouth daily before breakfast.   pantoprazole  (PROTONIX ) 40 MG tablet Take 1 tablet (40 mg total) by mouth 2 (two) times daily for 56 days, THEN 1 tablet (40 mg total) daily.   polyethylene glycol (MIRALAX  / GLYCOLAX ) 17 g packet Take 17 g by mouth daily as needed for mild constipation or moderate constipation. Also available OTC   prochlorperazine  (COMPAZINE ) 10 MG tablet Take 1 tablet (10 mg total) by mouth every 6 (six) hours as needed for nausea or vomiting.   traMADol  (ULTRAM ) 50 MG tablet Take 1 tablet (50 mg total) by mouth every 8 (eight) hours as needed for moderate pain (pain score 4-6) or severe pain (pain score 7-10) (pain).   [DISCONTINUED] entecavir  (BARACLUDE ) 0.5 MG tablet Take 1 tablet (0.5 mg total) by mouth every 3 (three) days.   entecavir  (BARACLUDE ) 0.5 MG tablet Take 1 tablet (0.5 mg total) by mouth every 3 (three) days.   torsemide  (DEMADEX ) 20 MG tablet Take 0.5 tablets (10 mg total) by mouth daily. (Patient not taking: Reported on 11/01/2023)   [DISCONTINUED] gabapentin  (NEURONTIN ) 100 MG capsule  Take 1 capsule 3 x /day for Neuropathy Pain (Patient not taking: Reported on 01/25/2020)   Facility-Administered Encounter Medications as of 11/01/2023  Medication   pegfilgrastim -cbqv (UDENYCA ) injection 6 mg     Patient Active Problem List   Diagnosis Date Noted   Chronic combined systolic and diastolic heart failure (HCC) 10/16/2023   Anemia 10/08/2023   Non-ST elevation (NSTEMI) myocardial infarction (HCC) 10/07/2023   Acute respiratory failure with hypoxia and hypercapnia (HCC) 10/07/2023   Acute systolic heart failure (HCC) 10/07/2023   Hypercapnic respiratory failure (HCC) 10/06/2023   Port-A-Cath in place 09/12/2023   Generalized weakness 09/02/2023   Stricture and stenosis of esophagus 08/31/2023   Multiple duodenal ulcers 08/31/2023   Dysphagia 08/30/2023   Malnutrition of moderate degree 08/29/2023   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   Leucocytosis 08/28/2023   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   Perinephric abdominal mass 08/03/2023   Hyperkalemia 08/03/2023   AKI (acute kidney injury) (HCC) 08/01/2023   Hypercalcemia 06/15/2023   Traumatic perinephric hematoma of right side, initial encounter 06/15/2023   ILD (interstitial lung disease) (HCC) 06/15/2023   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   Weakness 06/15/2023   Kidney laceration, right 06/15/2023   Atherosclerosis of aorta (HCC) 02/07/2023   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   Obesity (BMI 30.0-34.9) 08/06/2020   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   Chronic kidney disease, stage 3b (HCC) 09/10/2015   Vitamin D  deficiency 06/20/2013  Medication management 06/20/2013   Hypertension    Hyperlipidemia    Abnormal glucose    GERD (gastroesophageal reflux disease)    Osteopenia      Health Maintenance Due  Topic Date Due   Hepatitis B Vaccines (1 of 3 - Risk 3-dose series) 07/15/2000    Medicare Annual Wellness (AWV)  05/07/2021   DTaP/Tdap/Td (4 - Td or Tdap) 02/26/2023   INFLUENZA VACCINE  11/03/2023     Review of Systems 12 point ros has bene reviewed Physical Exam   BP (!) 146/74   Pulse 82   Temp 98.2 F (36.8 C) (Oral)   SpO2 100%   Physical Exam  Constitutional:  oriented to person, place, and time. appears well-developed and well-nourished. No distress.  HENT: [place]/AT, PERRLA, no scleral icterus Mouth/Throat: Oropharynx is clear and moist. No oropharyngeal exudate.  Cardiovascular: Normal rate, regular rhythm and normal heart sounds. Exam reveals no gallop and no friction rub.  No murmur heard.  Pulmonary/Chest: Effort normal and breath sounds normal. No respiratory distress.  has no wheezes.  Neck = supple, no nuchal rigidity Abdominal: Soft. Bowel sounds are normal.  exhibits no distension. There is no tenderness.  Lymphadenopathy: no cervical adenopathy. No axillary adenopathy Neurological: alert and oriented to person, place, and time.  Skin: Skin is warm and dry. No rash noted. No erythema.  Psychiatric: a normal mood and affect.  behavior is normal.   Lab Results  Component Value Date   HEPBSAB NON-REACTIVE 08/18/2023    CBC Lab Results  Component Value Date   WBC 11.6 (H) 10/25/2023   RBC 2.99 (L) 10/25/2023   HGB 8.7 (L) 10/25/2023   HCT 30.8 (L) 10/25/2023   PLT 188 10/25/2023   MCV 103.0 (H) 10/25/2023   MCH 29.1 10/25/2023   MCHC 28.2 (L) 10/25/2023   RDW Not Measured 10/25/2023   LYMPHSABS 1.1 10/25/2023   MONOABS 1.5 (H) 10/25/2023   EOSABS 0.0 10/25/2023    BMET Lab Results  Component Value Date   NA 135 10/25/2023   K 3.4 (L) 10/25/2023   CL 93 (L) 10/25/2023   CO2 31 10/25/2023   GLUCOSE 104 (H) 10/25/2023   BUN 44 (H) 10/25/2023   CREATININE 2.14 (H) 10/25/2023   CALCIUM 9.1 10/25/2023   GFRNONAA 22 (L) 10/25/2023   GFRAA 52 (L) 08/07/2020      Assessment and Plan  Chronic hepatitis b = continue with taking  entecavir  Q 72hr per renal function - will arrange for checking hepatitis b viral load at her next blood draw--  CLL with b-cell lymphoproliferation = last visit with dr onesimo suggests still poor outcome and further discussion for goals of care underway  See back in  2-3 months

## 2023-11-05 NOTE — Progress Notes (Signed)
 Office Visit Note  Patient: Jacqueline Hancock             Date of Birth: [birth year]           MRN: [id number]             PCP: Leonarda Roxan BROCKS, NP Referring: Geronimo Amel, MD Visit Date: 11/06/2023  Subjective:  New Patient (Initial Visit) (Abnormal Labs )   Discussed the use of AI scribe software for clinical note transcription with the patient, who gave verbal consent to proceed.  History of Present Illness   Jacqueline Hancock is an 83 year old female who presents for evaluation referred from pulmonary clinic for lung imaging changes for possible ILD and abnormal lab workup.  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  She experiences intermittent coughing, primarily when sitting down, without associated pain or sputum production. There is occasional chest pain when breathing deeply. No recent shortness of breath.  She reports a loss of taste over the past couple of weeks, stating 'food don't taste.'  She has noticed her nails turning blue for the past couple of months, which coincided with her treatment with ibrutinib  for chronic lymphoma. She experienced dry skin and swelling of the feet during this treatment. She received four doses of ibrutinib , with the last dose taken two weeks ago before discontinuation.  She has difficulty lying flat and sleeps with her head elevated in a hospital bed at home. She reports edema in her ankles and abdomen.  She has not noticed new oral or nasal ulcers. There is chronic adenopathy related to her disease.  Activities of Daily Living:  Patient reports morning stiffness for 5 minutes.   Patient Denies nocturnal pain.  Difficulty dressing/grooming: Denies Difficulty climbing stairs: Reports Difficulty getting out of  chair: Denies Difficulty using hands for taps, buttons, cutlery, and/or writing: Reports  Review of Systems  Constitutional:  Positive for fatigue.  HENT:  Negative for mouth sores and mouth dryness.   Eyes:  Negative for dryness.  Respiratory:  Negative for shortness of breath.   Cardiovascular:  Negative for chest pain and palpitations.  Gastrointestinal:  Positive for constipation. Negative for blood in stool and diarrhea.  Endocrine: Positive for increased urination.  Genitourinary:  Negative for involuntary urination.  Musculoskeletal:  Positive for gait problem and morning stiffness. Negative for joint pain, joint pain, joint swelling, myalgias, muscle weakness, muscle tenderness and myalgias.  Skin:  Positive for color change. Negative for rash, hair loss and sensitivity to sunlight.  Allergic/Immunologic: Positive for susceptible to infections.  Neurological:  Negative for dizziness and headaches.  Hematological:  Negative for swollen glands.  Psychiatric/Behavioral:  Negative for depressed mood and sleep disturbance. The patient is not nervous/anxious.     PMFS History:  Patient Active Problem List   Diagnosis Date Noted   Positive double stranded DNA antibody test 11/06/2023   Chronic combined systolic and diastolic heart failure (HCC) 10/16/2023   Anemia 10/08/2023   Non-ST elevation (NSTEMI) myocardial infarction (HCC) 10/07/2023   Acute respiratory failure with hypoxia and hypercapnia (HCC) 10/07/2023   Acute systolic heart failure (HCC) 10/07/2023   Hypercapnic respiratory failure (HCC) 10/06/2023   Port-A-Cath in place 09/12/2023   Generalized weakness 09/02/2023   Stricture and stenosis of esophagus 08/31/2023   Multiple duodenal  ulcers 08/31/2023   Dysphagia 08/30/2023   Malnutrition of moderate degree 08/29/2023   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   Leucocytosis 08/28/2023   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    Perinephric abdominal mass 08/03/2023   Hyperkalemia 08/03/2023   Hypercalcemia 06/15/2023   Traumatic perinephric hematoma of right side, initial encounter 06/15/2023   ILD (interstitial lung disease) (HCC) 06/15/2023   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   Weakness 06/15/2023   Kidney laceration, right 06/15/2023   Atherosclerosis of aorta (HCC) 02/07/2023   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   Obesity (BMI 30.0-34.9) 08/06/2020   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   Chronic kidney disease, stage 3b (HCC) 09/10/2015   Vitamin D  deficiency 06/20/2013   Medication management 06/20/2013   Hypertension    Hyperlipidemia    Abnormal glucose    GERD (gastroesophageal reflux disease)    Osteopenia     Past Medical History:  Diagnosis Date   CLL (chronic lymphocytic leukemia) (HCC)    Congestive heart failure (CHF) (HCC) 09/2023   Dyspnea 2021   with exersion    Early cataracts, bilateral    Elevated hemoglobin A1c    Fibroids    GERD (gastroesophageal reflux disease)    Hyperlipidemia    Hypertension    Lymphoma (HCC)    Osteopenia     Family History  Problem Relation Age of Onset   Leukemia Mother    Hypertension Father    Diabetes Sister    Heart disease Brother    Diabetes Brother    Diabetes Brother    Hypertension Son    Heart disease Son    Past Surgical History:  Procedure Laterality Date   ABDOMINAL HYSTERECTOMY     APPENDECTOMY     BALLOON DILATION N/A 08/31/2023   Procedure: BALLOON DILATION;  Surgeon: Charlanne Groom, MD;  Location: [facility];  Service: Gastroenterology;  Laterality: N/A;   CATARACT EXTRACTION, BILATERAL Bilateral 09/2017   Dr. Anselmo   ESOPHAGOGASTRODUODENOSCOPY N/A 08/31/2023   Procedure: EGD (ESOPHAGOGASTRODUODENOSCOPY);  Surgeon: Charlanne Groom, MD;  Location: [facility];  Service: Gastroenterology;  Laterality: N/A;   IR IMAGING GUIDED PORT INSERTION   08/21/2023   PAROTID GLAND TUMOR EXCISION  2012   benign   TONSILLECTOMY AND ADENOIDECTOMY     Social History   Social History Narrative   Not on file   Immunization History  Administered Date(s) Administered   Influenza, High Dose Seasonal PF 01/28/2015, 12/11/2015, 01/25/2017, 01/22/2018, 01/10/2019, 02/03/2019, 02/05/2020, 02/03/2021, 02/03/2022, 12/27/2022   Influenza-Unspecified 01/29/2013, 12/27/2013, 01/25/2017   PFIZER(Purple Top)SARS-COV-2 Vaccination 05/11/2019, 06/03/2019   PPD Test 02/25/2013   Pfizer Covid-19 Vaccine Bivalent Booster 34yrs & up 01/20/2022   Pneumococcal Conjugate-13 01/24/2017   Pneumococcal Polysaccharide-23 02/25/2013   Td 09/13/2001, 02/20/2012   Tdap 02/25/2013   Zoster, Live 10/23/2006     Objective: Vital Signs: BP 118/73 (BP Location: Right Arm, Patient Position: Sitting, Cuff Size: Normal)   Pulse 85   Resp 15   Ht 4' 11 (1.499 m)   Wt 118 lb 6.4 oz (53.7 kg)   BMI 23.91 kg/m    Physical Exam Constitutional:      Comments: In wheelchair  Eyes:     Conjunctiva/sclera: Conjunctivae normal.  Cardiovascular:     Rate and Rhythm: Normal rate and regular rhythm.  Pulmonary:     Effort: Pulmonary effort is normal.     Breath sounds: Normal breath sounds.  Lymphadenopathy:     Cervical: No cervical adenopathy.  Skin:    General: Skin is warm and dry.     Comments: R>L pitting edema without overlying rash  Neurological:     Mental Status: She is alert.  Psychiatric:        Mood and Affect: Mood normal.      Musculoskeletal Exam:  Shoulders full ROM no tenderness or swelling Elbows full ROM no tenderness or swelling Wrists full ROM no tenderness or swelling Fingers full ROM no tenderness or swelling Knees full ROM no tenderness or swelling Ankles full ROM no tenderness or swelling   Investigation: No additional findings.  Imaging: US  RENAL Result Date: 11/13/2023 CLINICAL DATA:  Acute kidney injury.  Diffuse large B-cell  lymphoma. EXAM: RENAL / URINARY TRACT ULTRASOUND COMPLETE COMPARISON:  PET CT 10/19/2023. CT abdomen and pelvis 10/06/2023. FINDINGS: Right Kidney: Heterogeneous mass is seen in the region of the right kidney measuring 20.3 x 10.1 x 16.1 cm. Normal renal parenchyma is unable to be differentiated from this mass. Overall dimensions are similar to prior CT given differences in technique. Left Kidney: Renal measurements: 12.2 x 5.0 x 4.8 cm = volume: 152 mL. Echogenicity within normal limits. There is mild hydronephrosis. No focal lesion identified. Bladder: Appears normal for degree of bladder distention. Other: None. IMPRESSION: 1. Grossly unchanged heterogeneous mass in the region of the right kidney measuring 20.3 x 10.1 x 16.1 cm. Normal renal parenchyma is unable to be differentiated from this mass. 2. Mild left hydronephrosis. Electronically Signed   By: Greig Pique M.D.   On: 11/13/2023 16:38   DG Chest Portable 1 View Result Date: 11/13/2023 EXAM: 1 VIEW XRAY OF THE CHEST 11/13/2023 12:22:57 PM COMPARISON: AP radiograph of the chest dated 10/06/2023. CLINICAL HISTORY: CHF. Per chart: Pt presents from [facility] for HGB of 6.6. H/x non hodgkins lymphoma, HF, Hep B. FINDINGS: LUNGS AND PLEURA: The hazy pulmonary opacities noted on the previous study have resolved in the interim. No pleural effusion. No pneumothorax. HEART AND MEDIASTINUM: The heart is borderline in size. No acute abnormality of the mediastinal silhouette. BONES AND SOFT TISSUES: No acute osseous abnormality. An endotracheal tube and gastric tube have been removed. A right internal jugular chest port remains in place. IMPRESSION: 1. Resolution of hazy pulmonary opacities noted on the previous study. 2. Borderline heart size. Electronically signed by: evalene coho 11/13/2023 01:39 PM EDT RP Workstation: [id number]   NM PET Image Restag (PS) Skull Base To Thigh Result Date: 10/25/2023 CLINICAL DATA:  Subsequent treatment strategy for  diffuse large B-cell lymphoma. EXAM: NUCLEAR MEDICINE PET SKULL BASE TO THIGH TECHNIQUE: 6.3 mCi F-18 FDG was injected intravenously. Full-ring PET imaging was performed from the skull base to thigh after the radiotracer. CT data was obtained and used for attenuation correction and anatomic localization. Fasting blood glucose: 88 mg/dl COMPARISON:  5/85/7974 FINDINGS: Mediastinal blood pool activity: SUV max 2.2 Liver activity: SUV max 3.3 NECK: No significant abnormal hypermetabolic activity in this region. Incidental CT findings: None. CHEST: No significant abnormal hypermetabolic activity in this region. Incidental CT findings: Right Port-A-Cath tip: Lower right atrium. Mild cardiomegaly. Trace pericardial effusion. Atheromatous vascular calcification in the thoracic aorta. 4 mm left lower lobe nodule on image 65 series 4 previously shown to be calcified hence likely benign. 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 Anterior components of this process has a maximum SUV of 21.8 (Deauville 5),  previously 27.0. As before, this process fills the perirenal space extending into the retroperitoneum root of the mesentery region and tracking down towards the right pelvic sidewall. The degree of distal periureteral high activity is reduced. Newly hypermetabolic right pelvic sidewall lymph node measures 0.7 cm in short axis on image 146 series 4, maximum SUV 9.2 (Deauville 5). This previously measured 0.6 cm on the PET-CT from 07/17/2023, with maximum SUV 1.5. Right inguinal node measuring 0.7 cm in short axis on image 151 series 4 with maximum SUV 5.1 (Deauville 4), previously 0.7 cm in diameter with maximum SUV 2.4. Focal abnormal hypermetabolic activity along the anterior dome of the right hepatic lobe capsular margin, maximum SUV 8.5 (Deauville 5). The associated hypermetabolic activity measures approximately 1.3  cm in diameter. This could be along the diaphragm or hepatic capsule rather than necessarily being intrahepatic. Incidental CT findings: Atherosclerosis is present, including aortoiliac atherosclerotic disease. Mild ascites. Prominent stool throughout the colon favors constipation. Diffuse subcutaneous edema. SKELETON: No significant abnormal hypermetabolic activity in this region. Incidental CT findings: None. IMPRESSION: 1. Large right perinephric, periureteral, and retroperitoneal mass with Deauville 5 activity again observed with severe associated distortion of the right kidney. The degree of distal periureteral high activity is reduced. 2. Newly hypermetabolic right pelvic sidewall lymph node and right inguinal lymph node, both Deauville 5. 3. Focal abnormal hypermetabolic activity along the anterior dome of the right hepatic lobe capsular margin, Deauville 5. 4. Mild ascites. Diffuse subcutaneous edema. 5. Prominent stool throughout the colon favors constipation. 6.  Aortic Atherosclerosis (ICD10-I70.0). Electronically Signed   By: Ryan Salvage M.D.   On: 10/25/2023 06:57    Recent Labs: Lab Results  Component Value Date   WBC 10.2 11/13/2023   HGB 6.6 (LL) 11/13/2023   PLT 280 11/13/2023   NA 135 11/13/2023   K 3.0 (L) 11/13/2023   CL 96 (L) 11/13/2023   CO2 26 11/13/2023   GLUCOSE 103 (H) 11/13/2023   BUN 42 (H) 11/13/2023   CREATININE 2.48 (H) 11/13/2023   BILITOT 1.0 11/13/2023   ALKPHOS 75 11/13/2023   AST 11 (L) 11/13/2023   ALT 8 11/13/2023   PROT 6.4 (L) 11/13/2023   ALBUMIN  3.6 11/13/2023   CALCIUM 9.3 11/13/2023   GFRAA 52 (L) 08/07/2020   QFTBGOLDPLUS NEGATIVE 02/21/2023    Speciality Comments: No specialty comments available.  Procedures:  No procedures performed Allergies: Peanut (diagnostic) and Nexium [esomeprazole magnesium ]   Assessment / Plan:     Visit Diagnoses: Positive double stranded DNA antibody test ILD (interstitial lung disease) (HCC) - Plan:  Anti-DNA antibody, double-stranded, C-reactive protein, C3 and C4, CK, Sedimentation rate, CANCELED: Anti-DNA antibody, double-stranded, CANCELED: C3 and C4, CANCELED: Sedimentation rate, CANCELED: C-reactive protein, CANCELED: CK Potential autoimmune-related pulmonary involvement due to abnormal blood tests and inflammatory changes around the lungs. Differential includes Sjogren's syndrome, lupus, and other autoimmune conditions but I suspect more likely incidental finding given oncology disease and immunotherapy. Discontinuation of ibrutinib  may reduce autoimmune-like symptoms. - Order blood tests to evaluate for autoimmune conditions. - Provide lab orders for blood draw at the cancer center.  Cough, chest pain with breathing, and orthopnea Intermittent cough and chest pain with breathing. Orthopnea present, requiring head elevation during sleep. Symptoms may be related to underlying lymphoma or potential autoimmune involvement.  Chronic lymphocytic leukemia/lymphoma (active disease) Active chronic lymphocytic leukemia/lymphoma with recent increased disease activity. Recent PET scan showed minimal response to ibrutinib , indicating possible resistance due to cancer cell mutations. Treatment with ibrutinib   discontinued due to lack of efficacy and side effects.  Peripheral edema and ascites Significant peripheral edema, particularly in the ankles, and ascites with fluid accumulation in the abdomen. Likely related to underlying lymphoma and treatment side effects.  Orders: Orders Placed This Encounter  Procedures   Anti-DNA antibody, double-stranded   C-reactive protein   C3 and C4   CK   Sedimentation rate   No orders of the defined types were placed in this encounter.    Follow-Up Instructions: No follow-ups on file.   Lonni LELON Ester, MD  Note - This record has been created using [employer].  Chart creation errors have been sought, but may not always  have been located.  Such creation errors do not reflect on  the standard of medical care.

## 2023-11-06 ENCOUNTER — Ambulatory Visit: Attending: Internal Medicine | Admitting: Internal Medicine

## 2023-11-06 ENCOUNTER — Encounter: Payer: Self-pay | Admitting: Internal Medicine

## 2023-11-06 VITALS — BP 118/73 | HR 85 | Resp 15 | Ht 59.0 in | Wt 118.4 lb

## 2023-11-06 DIAGNOSIS — J849 Interstitial pulmonary disease, unspecified: Secondary | ICD-10-CM

## 2023-11-06 DIAGNOSIS — R768 Other specified abnormal immunological findings in serum: Secondary | ICD-10-CM

## 2023-11-08 ENCOUNTER — Telehealth: Payer: Self-pay

## 2023-11-08 NOTE — Telephone Encounter (Signed)
 Copied from CRM #[id number]. Topic: Clinical - Home Health Verbal Orders >> Nov 08, 2023  4:17 PM Miquel SAILOR wrote: Caller/Agency: Vickie from [facility]  Callback Number: [phone] Service Requested: Skilled Nursing Frequency: Skilled 1 week for 4 observation and assessment/disease management/Patient education/ Wound Care   Any new concerns about the patient? No   Spoke with DeAndrea from [facility] to give the Home Health Verbal Orders per [facility] policy.  FYI Ngetich, Roxan BROCKS, NP has been notified  Message sent to [employer], Roxan BROCKS, NP

## 2023-11-08 NOTE — Telephone Encounter (Signed)
 Noted

## 2023-11-08 NOTE — Progress Notes (Addendum)
 Lymphoma Location(s) / Histology:  Diffuse Large B-Cell Lymphoma of Lymph Nodes of Multiple Regions  Jacqueline Hancock presented 2 months ago with symptoms of:  Leg weeping, urinary frequency with lasix , poor taste, issues swallowing meat and difficulty breathing when sleeping. Patient reported continued use of a walker and wheelchair for ambulation, continued lower extremity edema, wound on left anterior leg secondary to legs weeping, new onset of intermittent right flank/lower quadrant abdominal pain,  Biopsies of  (if applicable) revealed:  FINAL MICROSCOPIC DIAGNOSIS:   A. RETROPERITONEAL MASS, BIOPSY:  -  B-cell lymphoproliferative disorder.   10/19/2023 PET Scan IMPRESSION: 1. Large right perinephric, periureteral, and retroperitoneal mass with Deauville 5 activity again observed with severe associated distortion of the right kidney. The degree of distal periureteral high activity is reduced. 2. Newly hypermetabolic right pelvic sidewall lymph node and right inguinal lymph node, both Deauville 5. 3. Focal abnormal hypermetabolic activity along the anterior dome of the right hepatic lobe capsular margin, Deauville 5. 4. Mild ascites. Diffuse subcutaneous edema. 5. Prominent stool throughout the colon favors constipation. 6.  Aortic Atherosclerosis (ICD10-I70.0).   Past/Anticipated interventions by medical oncology, if any:  [place], MD -had an honest discussion with patient, and discussed that even with adjusted dosing of treatment, she may not be able to reach complete remission after 6 cycles and there would be a high risk of complications with treatment -we discussed that there is a very high risk she will become hospitalized if she chooses to proceed with treatment, potentially due to fluid status, stress on the heart, high risk of decompensating the heart again, dropping blood counts, and infections -discussed option of hospice to focus on comfort care if she chooses to -discussed  option to continue to treat her. Discussed that treatment would be used for palliation and not for long-term control  -if she decides to continue treatment, we will stop anthracycline due to its cardio toxicities, and will switch to Etoposide  -discussed that Etoposide would be a daily medication for 3 days every 3 weeks.  -discussed option to consult with a radiation oncologist to see if they can do palliative radiation therapy in a reasonable fashion then considering systemic therapies afterwards. Discussed that local palliative radiation would be considered to help delay symptoms from cancer and would not be curative.  -do not think that patient would be able to tolerate intensive treatment that would be required for cure and do not think that patient would be a candidate for a bone marrow transplant.  -discussed that I am unsure if she would be a candidate for CAR-T cell therapy, though I would be happy to place referral to an academic center such as [facility] or [facility] if she chooses to for an additional opinion -patient is inclined to take some time to discuss with her family prior to making a proceeding decision -we will hold R-miniCHOP treatment today -ordered entecavir  0.5 MG for her chronic active hepatitis B as it will need to be continued -answered all of patient's and her family member's questions in detail     Weight changes, if any, over the past 6 months: None  Recurrent fevers, or drenching night sweats, if any: None  SAFETY ISSUES: Prior radiation? None Pacemaker/ICD? * Possible current pregnancy? None Is the patient on methotrexate? None  Current Complaints / other details:   None

## 2023-11-10 ENCOUNTER — Ambulatory Visit: Admitting: Family

## 2023-11-10 ENCOUNTER — Encounter: Payer: Self-pay | Admitting: Family

## 2023-11-10 VITALS — BP 122/70 | HR 78 | Temp 97.7°F | Resp 18 | Ht 59.0 in | Wt 120.4 lb

## 2023-11-10 DIAGNOSIS — N183 Chronic kidney disease, stage 3 unspecified: Secondary | ICD-10-CM

## 2023-11-10 DIAGNOSIS — I10 Essential (primary) hypertension: Secondary | ICD-10-CM | POA: Diagnosis not present

## 2023-11-10 DIAGNOSIS — E782 Mixed hyperlipidemia: Secondary | ICD-10-CM

## 2023-11-10 DIAGNOSIS — R7303 Prediabetes: Secondary | ICD-10-CM

## 2023-11-10 DIAGNOSIS — R432 Parageusia: Secondary | ICD-10-CM

## 2023-11-10 DIAGNOSIS — K219 Gastro-esophageal reflux disease without esophagitis: Secondary | ICD-10-CM | POA: Diagnosis not present

## 2023-11-10 DIAGNOSIS — I5042 Chronic combined systolic (congestive) and diastolic (congestive) heart failure: Secondary | ICD-10-CM

## 2023-11-10 NOTE — Progress Notes (Signed)
 Provider: Roxan Plough FNP-C   Cressida Milford, Roxan BROCKS, NP  Patient Care Team: Jamesia Linnen, Roxan BROCKS, NP as PCP - General (Family Medicine) Kate Lonni CROME, MD as PCP - Cardiology (Cardiology) Arlana Arnt, MD as Consulting Physician (Otolaryngology) Luis Purchase, MD as Consulting Physician (Gastroenterology) Raeanne Shanda SQUIBB, MD (Inactive) as Consulting Physician (Obstetrics and Gynecology) Roz Anes, MD as Consulting Physician (Ophthalmology) Quinn Odor, [facility] (Inactive) as Pharmacist (Pharmacist) Onesimo Emaline Brink, MD as Consulting Physician (Hematology)  Extended Emergency Contact Information Primary Emergency Contact: Cloria Mickey Lenis Address: [address]          [place], [place] [address] [place] Home Phone: [phone] Mobile Phone: [phone] Relation: Son Secondary Emergency Contact: Recine,Keith Mobile Phone: [phone] Relation: Son Preferred language: English Interpreter needed? No  Code Status:  Full Code Goals of care: Advanced Directive information    11/10/2023   10:36 AM  Advanced Directives  Does Patient Have a Medical Advance Directive? Yes  Type of Estate agent of [place];Living will  Does patient want to make changes to medical advance directive? No - Patient declined  Copy of Healthcare Power of Attorney in Chart? No - copy requested     Chief Complaint  Patient presents with   Medical Management of Chronic Issues    4 month follow up     Discussed the use of AI scribe software for clinical note transcription with the patient, who gave verbal consent to proceed.  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  She was hospitalized last month for heart failure. Since discharge, she has experienced no further episodes of shortness of breath. She  follows up with her cardiologist and has an upcoming appointment with her oncologist for CLL. She experiences swelling in her legs and occasional cough. She uses compression stockings and takes torsemide  20 mg as needed if her weight increases by more than three pounds from her baseline of 140 pounds. Her current weight is 120.4 pounds.  She reports issues with her right ear, stating 'I can't hear' and needing to turn her head to hear better on the left side. She experiences itching in both ears but denies using Q-tips for cleaning.  Her cholesterol levels were checked on the 6th, showing an LDL of 102 and triglycerides of 147.  She uses a walker at home and reports that her strength is 'pretty good,' although her left hand is weaker. She has been receiving physical therapy at home from [facility], which is expected to stop soon as she has been improving. She continues to perform exercises at home as instructed by the therapists.  No shortness of breath, occasional cough, and reports of swelling in her legs. No issues with urination, regular bowel movements, and no tenderness in her abdomen or back. Reports itching in her ears but no pain. No pain in her forehead.    Past Medical History:  Diagnosis Date   CLL (chronic lymphocytic leukemia) (HCC)    Congestive heart failure (CHF) (HCC) 09/2023   Dyspnea 2021   with exersion    Early cataracts, bilateral    Elevated hemoglobin A1c    Fibroids    GERD (gastroesophageal reflux disease)    Hyperlipidemia    Hypertension    Lymphoma (HCC)    Osteopenia    Past Surgical History:  Procedure Laterality Date  ABDOMINAL HYSTERECTOMY     APPENDECTOMY     BALLOON DILATION N/A 08/31/2023   Procedure: BALLOON DILATION;  Surgeon: Charlanne Groom, MD;  Location: [facility];  Service: Gastroenterology;  Laterality: N/A;   CATARACT EXTRACTION, BILATERAL Bilateral 09/2017   Dr. 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 If you have any questions, ask your nurse or doctor.          Aspirin  Low Dose 81 MG chewable tablet Generic drug: aspirin  Chew 1 tablet (81 mg total) by mouth daily.   entecavir  0.5 MG tablet Commonly known as: BARACLUDE  Take 1 tablet (0.5 mg total) by mouth every 3 (three) days.   Jardiance  10 MG Tabs tablet Generic drug: empagliflozin  Take 1 tablet (10 mg total) by mouth daily before breakfast.   pantoprazole  40 MG tablet Commonly known as: PROTONIX  Take 1 tablet (40 mg total) by mouth 2 (two) times daily for 56 days, THEN 1 tablet (40 mg total) daily. Start taking on: September 05, 2023   polyethylene glycol 17 g packet Commonly known as: MIRALAX  / GLYCOLAX  Take 17 g by mouth daily as needed for mild constipation or moderate constipation. Also available OTC   prochlorperazine  10 MG tablet Commonly known as: COMPAZINE  Take 1 tablet (10 mg total) by mouth every 6 (six) hours as needed for nausea or vomiting.   torsemide  20 MG tablet Commonly known as: DEMADEX  Take 0.5 tablets (10 mg total) by mouth daily.   traMADol  50 MG tablet Commonly known as: ULTRAM  Take 1 tablet (50 mg total) by mouth every 8 (eight) hours as needed for moderate pain (pain score 4-6) or severe pain (pain score 7-10) (pain).        Review of  Systems  Constitutional:  Negative for appetite change, chills, fatigue, fever and unexpected weight change.  HENT:  Positive for hearing loss. Negative for congestion, dental problem, ear discharge, ear pain, facial swelling, nosebleeds, postnasal drip, rhinorrhea, sinus pressure, sinus pain, sneezing, sore throat, tinnitus and trouble swallowing.   Eyes:  Negative for pain, discharge, redness, itching and visual disturbance.  Respiratory:  Negative for cough, chest tightness, shortness of breath and wheezing.   Cardiovascular:  Negative for chest pain, palpitations and leg swelling.  Gastrointestinal:  Negative for abdominal distention, abdominal pain, blood in stool, constipation, diarrhea, nausea and vomiting.  Endocrine: Negative for cold intolerance, heat intolerance, polydipsia, polyphagia and polyuria.  Genitourinary:  Negative for difficulty urinating, dysuria, flank pain, frequency and urgency.  Musculoskeletal:  Positive for gait problem. Negative for arthralgias, back pain, joint swelling, myalgias, neck pain and neck stiffness.  Skin:  Negative for color change, pallor, rash and wound.  Neurological:  Negative for dizziness, syncope, speech difficulty, weakness, light-headedness, numbness and headaches.  Hematological:  Does not bruise/bleed easily.  Psychiatric/Behavioral:  Negative for agitation, behavioral problems, confusion, hallucinations, self-injury, sleep disturbance and suicidal ideas. The patient is not nervous/anxious.     Immunization History  Administered Date(s) Administered   Influenza, High Dose Seasonal PF 01/28/2015, 12/11/2015, 01/25/2017, 01/22/2018, 01/10/2019, 02/03/2019, 02/05/2020, 02/03/2021, 02/03/2022, 12/27/2022   Influenza-Unspecified 01/29/2013, 12/27/2013, 01/25/2017   PFIZER(Purple Top)SARS-COV-2 Vaccination 05/11/2019, 06/03/2019   PPD Test 02/25/2013   Pfizer Covid-19 Vaccine Bivalent Booster 2yrs & up 01/20/2022   Pneumococcal Conjugate-13  01/24/2017   Pneumococcal Polysaccharide-23 02/25/2013   Td 09/13/2001, 02/20/2012   Tdap 02/25/2013   Zoster, Live 10/23/2006   Pertinent  Health Maintenance Due  Topic Date Due   INFLUENZA VACCINE  02/10/2024 (Originally 11/03/2023)   DEXA SCAN  Completed      08/17/2022   10:08 AM 06/14/2023   10:41 AM 07/18/2023    9:02 AM 09/12/2023   10:20 AM 11/01/2023    9:34 AM  Fall Risk  Falls in the past year? 0 1 0 1 0  Was there an injury with Fall?  0 0 1 0  Fall Risk Category Calculator  2 0 3 0  Patient at Risk for Falls Due to  Impaired balance/gait;Impaired mobility History of fall(s) History of fall(s) Impaired mobility  Fall risk Follow up  Falls evaluation completed Falls evaluation completed Falls evaluation completed Falls evaluation completed   Functional Status Survey:    Vitals:   11/10/23 1039  BP: 122/70  Pulse: 78  Resp: 18  Temp: 97.7 F (36.5 C)  SpO2: 99%  Weight: 120 lb 6.4 oz (54.6 kg)  Height: 4' 11 (1.499 m)   Body mass index is 24.32 kg/m. Physical Exam  VITALS: T- 97.7, P- 78, BP- 122/70, SaO2- 99% MEASUREMENTS: Weight- 120.4. GENERAL: Alert, cooperative, well developed, no acute distress HEENT: Normocephalic, normal oropharynx, moist mucous membranes, ears without cerumen or infection, nose normal, no sinus tenderness NECK: Neck normal, thyroid  normal CHEST: Clear to auscultation bilaterally, no wheezes, rhonchi, or crackles CARDIOVASCULAR: Normal heart rate and rhythm, S1 and S2 normal without murmurs ABDOMEN: Soft, non-tender, non-distended, without organomegaly, normal bowel sounds EXTREMITIES: No cyanosis or edema, extremities normal NEUROLOGICAL: Cranial nerves grossly intact, moves all extremities without gross motor or sensory deficit SKIN: No rash,no lesion or erythema   PSYCHIATRY/BEHAVIORAL: Mood stable   Labs reviewed: Recent Labs    08/31/23 0420 09/01/23 0516 09/02/23 0423 10/07/23 0144 10/08/23 0500 10/10/23 1000  10/11/23 0325 10/12/23 0325 10/25/23 1020  NA 141 141   < > 139   < > 136 134* 135 135  K 4.5 4.6   < > 4.0   < > 3.8 3.5 3.5 3.4*  CL 101 106   < > 104   < > 98 99 96* 93*  CO2 25 26   < > 20*   < > 26 25 27 31   GLUCOSE 96 90   < > 99   < > 123* 103* 92 104*  BUN 113* 99*   < > 33*   < > 48* 41* 42* 44*  CREATININE 3.64* 3.38*   < > 2.03*   < > 1.71* 1.67* 1.74* 2.14*  CALCIUM 6.7* 6.6*   < > 8.2*   < > 8.8* 8.2* 8.5* 9.1  MG  --   --   --  1.5*   < > 1.9 1.9 1.8  --   PHOS 7.0* 5.9*  --  5.0*  --   --   --   --   --    < > =  values in this interval not displayed.   Recent Labs    10/04/23 1100 10/06/23 0416 10/25/23 1020  AST 16 39 14*  ALT 11 23 9   ALKPHOS 109 108 98  BILITOT 0.7 1.3* 0.9  PROT 5.8* 5.7* 6.5  ALBUMIN  3.4* 2.9* 3.7   Recent Labs    09/21/23 1404 10/04/23 1100 10/06/23 0416 10/11/23 0325 10/12/23 0325 10/25/23 1020  WBC 2.5* 13.7*   < > 4.9 2.4* 11.6*  NEUTROABS 1.4* 10.1*  --   --   --  8.8*  HGB 6.2* 9.0*   < > 9.0* 8.2* 8.7*  HCT 20.4* 28.3*   < > 28.0* 25.8* 30.8*  MCV 89.1 88.7   < > 88.9 91.2 103.0*  PLT 147* 147*   < > 166 158 188   < > = values in this interval not displayed.   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 EXAM: NUCLEAR MEDICINE PET SKULL BASE TO THIGH TECHNIQUE: 6.3 mCi F-18 FDG was injected intravenously. Full-ring PET imaging was performed from the skull base to thigh after the radiotracer. CT data was obtained and used for attenuation correction and anatomic localization. Fasting  blood glucose: 88 mg/dl COMPARISON:  5/85/7974 FINDINGS: Mediastinal blood pool activity: SUV max 2.2 Liver activity: SUV max 3.3 NECK: No significant abnormal hypermetabolic activity in this region. Incidental CT findings: None. CHEST: No significant abnormal hypermetabolic activity in this region. Incidental CT findings: Right Port-A-Cath tip: Lower right atrium. Mild cardiomegaly. Trace pericardial effusion. Atheromatous vascular calcification in the thoracic aorta. 4 mm left lower lobe nodule on image 65 series 4 previously shown to be calcified hence likely benign. 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 Anterior components of this process has a maximum SUV of 21.8 (Deauville 5), previously 27.0. As before, this process fills the perirenal space extending into the retroperitoneum root of the mesentery region and tracking down towards the right pelvic sidewall. The degree of distal periureteral high activity is reduced. Newly hypermetabolic right pelvic sidewall lymph node measures 0.7 cm in short axis on image 146 series 4, maximum SUV 9.2 (Deauville 5). This previously measured 0.6 cm on the PET-CT from 07/17/2023, with maximum SUV 1.5. Right inguinal node measuring 0.7 cm in short axis on image 151 series 4 with maximum SUV 5.1 (Deauville 4), previously 0.7 cm in diameter with maximum SUV 2.4. Focal abnormal hypermetabolic activity along the anterior dome of the right hepatic lobe capsular margin, maximum SUV 8.5 (Deauville 5). The associated hypermetabolic activity measures approximately 1.3 cm in diameter. This could be along the diaphragm or hepatic capsule rather than necessarily being intrahepatic. Incidental CT findings: Atherosclerosis is present, including aortoiliac atherosclerotic disease. Mild ascites. Prominent stool throughout the colon favors constipation. Diffuse subcutaneous edema.  SKELETON: No significant abnormal hypermetabolic activity in this region. Incidental CT findings: None. IMPRESSION: 1. Large right perinephric, periureteral, and retroperitoneal mass with Deauville 5 activity again observed with severe  associated distortion of the right kidney. The degree of distal periureteral high activity is reduced. 2. Newly hypermetabolic right pelvic sidewall lymph node and right inguinal lymph node, both Deauville 5. 3. Focal abnormal hypermetabolic activity along the anterior dome of the right hepatic lobe capsular margin, Deauville 5. 4. Mild ascites. Diffuse subcutaneous edema. 5. Prominent stool throughout the colon favors constipation. 6.  Aortic Atherosclerosis (ICD10-I70.0). Electronically Signed   By: Ryan Salvage M.D.   On: 10/25/2023 06:57    Assessment/Plan    Heart failure with bilateral lower extremity edema Recent hospitalization for heart failure. Currently experiencing bilateral lower extremity edema and occasional cough. No shortness of breath. Blood pressure is 122/70 mmHg, heart rate is 78 bpm, and oxygen saturation is 99%. - Continue torsemide  20 mg daily as needed if weight increases by more than 3 pounds from baseline of 140 lbs - Recheck blood work to monitor electrolytes, especially potassium - Wear compression stockings daily, putting them on in the morning and removing at bedtime - Consult medical supply for thigh-high compression stockings - Follow up with cardiologist  Chronic B-cell Lymphoma Scheduled follow-up with oncologist for management - Attend follow-up appointment with oncologist  Hyperlipidemia LDL cholesterol slightly elevated at 102 mg/dL. Total cholesterol and triglycerides within acceptable range but increased from previous levels. Triglycerides at 147 mg/dL, close to the upper limit of 150 mg/dL. - Monitor dietary intake, particularly starchy foods like bread, pasta, white rice, and potatoes - Recheck cholesterol levels in  4-6 months   Prediabetes  Previous A1C 5.7 - dietary modification advised   Hearing loss, right ear Complaints of hearing loss in the right ear. No signs of infection or wax buildup. Itching noted in both ears. - Avoid using Q-tips to clean ears - continue to monitor   Dysgeusia Complaints of altered taste, possibly related to medication use. - Monitor taste changes   Family/ staff Communication: Reviewed plan of care with patient verbalized understanding  Labs/tests ordered:  - CBC with Differential/Platelet - CMP with eGFR([facility]) - TSH - Hgb A1C - Lipid panel  Next Appointment : Return in about 6 months (around 05/12/2024) for medical mangement of chronic issues.Jacqueline Hancock   Spent 30 minutes of Face to face and non-face to face with patient  >50% time spent counseling; reviewing medical record; tests; labs; documentation and developing future plan of care.   Roxan JAYSON Plough, NP

## 2023-11-11 ENCOUNTER — Encounter: Payer: Self-pay | Admitting: Hematology

## 2023-11-11 ENCOUNTER — Other Ambulatory Visit: Payer: Self-pay

## 2023-11-13 ENCOUNTER — Inpatient Hospital Stay: Attending: Hematology | Admitting: Hematology

## 2023-11-13 ENCOUNTER — Other Ambulatory Visit: Payer: Self-pay

## 2023-11-13 ENCOUNTER — Telehealth: Payer: Self-pay

## 2023-11-13 ENCOUNTER — Other Ambulatory Visit: Payer: Self-pay | Admitting: Internal Medicine

## 2023-11-13 ENCOUNTER — Observation Stay (HOSPITAL_BASED_OUTPATIENT_CLINIC_OR_DEPARTMENT_OTHER)
Admission: EM | Admit: 2023-11-13 | Discharge: 2023-11-15 | Disposition: A | Discharge status: Home / Self Care | Source: Home / Self Care | Attending: Emergency Medicine | Admitting: Emergency Medicine

## 2023-11-13 ENCOUNTER — Ambulatory Visit: Payer: Self-pay | Admitting: Family

## 2023-11-13 ENCOUNTER — Encounter (HOSPITAL_COMMUNITY): Payer: Self-pay

## 2023-11-13 ENCOUNTER — Emergency Department (HOSPITAL_COMMUNITY)

## 2023-11-13 ENCOUNTER — Observation Stay (HOSPITAL_COMMUNITY)

## 2023-11-13 ENCOUNTER — Inpatient Hospital Stay: Attending: Hematology

## 2023-11-13 VITALS — BP 132/69 | HR 80 | Temp 97.3°F | Resp 18 | Wt 117.6 lb

## 2023-11-13 DIAGNOSIS — Z95828 Presence of other vascular implants and grafts: Secondary | ICD-10-CM

## 2023-11-13 DIAGNOSIS — Z7189 Other specified counseling: Secondary | ICD-10-CM | POA: Diagnosis not present

## 2023-11-13 DIAGNOSIS — C8510 Unspecified B-cell lymphoma, unspecified site: Secondary | ICD-10-CM | POA: Insufficient documentation

## 2023-11-13 DIAGNOSIS — Z7984 Long term (current) use of oral hypoglycemic drugs: Secondary | ICD-10-CM | POA: Insufficient documentation

## 2023-11-13 DIAGNOSIS — C8338 Diffuse large B-cell lymphoma, lymph nodes of multiple sites: Secondary | ICD-10-CM

## 2023-11-13 DIAGNOSIS — E876 Hypokalemia: Secondary | ICD-10-CM | POA: Insufficient documentation

## 2023-11-13 DIAGNOSIS — Z510 Encounter for antineoplastic radiation therapy: Secondary | ICD-10-CM | POA: Insufficient documentation

## 2023-11-13 DIAGNOSIS — Z5111 Encounter for antineoplastic chemotherapy: Secondary | ICD-10-CM | POA: Insufficient documentation

## 2023-11-13 DIAGNOSIS — C9110 Chronic lymphocytic leukemia of B-cell type not having achieved remission: Secondary | ICD-10-CM | POA: Insufficient documentation

## 2023-11-13 DIAGNOSIS — E785 Hyperlipidemia, unspecified: Secondary | ICD-10-CM | POA: Diagnosis not present

## 2023-11-13 DIAGNOSIS — I130 Hypertensive heart and chronic kidney disease with heart failure and stage 1 through stage 4 chronic kidney disease, or unspecified chronic kidney disease: Secondary | ICD-10-CM | POA: Insufficient documentation

## 2023-11-13 DIAGNOSIS — G893 Neoplasm related pain (acute) (chronic): Secondary | ICD-10-CM | POA: Insufficient documentation

## 2023-11-13 DIAGNOSIS — N184 Chronic kidney disease, stage 4 (severe): Secondary | ICD-10-CM | POA: Insufficient documentation

## 2023-11-13 DIAGNOSIS — Z79899 Other long term (current) drug therapy: Secondary | ICD-10-CM | POA: Diagnosis not present

## 2023-11-13 DIAGNOSIS — R600 Localized edema: Secondary | ICD-10-CM | POA: Insufficient documentation

## 2023-11-13 DIAGNOSIS — Z515 Encounter for palliative care: Secondary | ICD-10-CM | POA: Diagnosis not present

## 2023-11-13 DIAGNOSIS — D649 Anemia, unspecified: Principal | ICD-10-CM | POA: Diagnosis present

## 2023-11-13 DIAGNOSIS — N183 Chronic kidney disease, stage 3 unspecified: Secondary | ICD-10-CM

## 2023-11-13 DIAGNOSIS — I5022 Chronic systolic (congestive) heart failure: Secondary | ICD-10-CM | POA: Diagnosis not present

## 2023-11-13 DIAGNOSIS — C8333 Diffuse large B-cell lymphoma, intra-abdominal lymph nodes: Secondary | ICD-10-CM | POA: Insufficient documentation

## 2023-11-13 DIAGNOSIS — Z7901 Long term (current) use of anticoagulants: Secondary | ICD-10-CM | POA: Insufficient documentation

## 2023-11-13 DIAGNOSIS — D591 Autoimmune hemolytic anemia, unspecified: Secondary | ICD-10-CM | POA: Insufficient documentation

## 2023-11-13 DIAGNOSIS — B181 Chronic viral hepatitis B without delta-agent: Secondary | ICD-10-CM | POA: Insufficient documentation

## 2023-11-13 LAB — CBC WITH DIFFERENTIAL/PLATELET
Absolute Lymphocytes: 979 {cells}/uL (ref 850–3900)
Absolute Monocytes: 847 {cells}/uL (ref 200–950)
Basophils Absolute: 31 {cells}/uL (ref 0–200)
Basophils Relative: 0.3 %
Eosinophils Absolute: 153 {cells}/uL (ref 15–500)
Eosinophils Relative: 1.5 %
HCT: 21.7 % — ABNORMAL LOW (ref 35.0–45.0)
Hemoglobin: 6.9 g/dL — ABNORMAL LOW (ref 11.7–15.5)
MCH: 27.7 pg (ref 27.0–33.0)
MCHC: 31.8 g/dL — ABNORMAL LOW (ref 32.0–36.0)
MCV: 87.1 fL (ref 80.0–100.0)
MPV: 10.7 fL (ref 7.5–12.5)
Monocytes Relative: 8.3 %
Neutro Abs: 8191 {cells}/uL — ABNORMAL HIGH (ref 1500–7800)
Neutrophils Relative %: 80.3 %
Platelets: 295 Thousand/uL (ref 140–400)
RBC: 2.49 Million/uL — ABNORMAL LOW (ref 3.80–5.10)
RDW: 13.7 % (ref 11.0–15.0)
Total Lymphocyte: 9.6 %
WBC: 10.2 Thousand/uL (ref 3.8–10.8)

## 2023-11-13 LAB — HEMOGLOBIN A1C
Hgb A1c MFr Bld: 5.3 % (ref <5.7)
Mean Plasma Glucose: 105 mg/dL
eAG (mmol/L): 5.8 mmol/L

## 2023-11-13 LAB — CBC WITH DIFFERENTIAL (CANCER CENTER ONLY)
Abs Immature Granulocytes: 0.04 K/uL (ref 0.00–0.07)
Basophils Absolute: 0 K/uL (ref 0.0–0.1)
Basophils Relative: 0 %
Eosinophils Absolute: 0.1 K/uL (ref 0.0–0.5)
Eosinophils Relative: 1 %
HCT: 20.1 % — ABNORMAL LOW (ref 36.0–46.0)
Hemoglobin: 6.6 g/dL — CL (ref 12.0–15.0)
Immature Granulocytes: 0 %
Lymphocytes Relative: 9 %
Lymphs Abs: 0.9 K/uL (ref 0.7–4.0)
MCH: 27.7 pg (ref 26.0–34.0)
MCHC: 32.8 g/dL (ref 30.0–36.0)
MCV: 84.5 fL (ref 80.0–100.0)
Monocytes Absolute: 0.7 K/uL (ref 0.1–1.0)
Monocytes Relative: 6 %
Neutro Abs: 8.6 K/uL — ABNORMAL HIGH (ref 1.7–7.7)
Neutrophils Relative %: 84 %
Platelet Count: 280 K/uL (ref 150–400)
RBC: 2.38 MIL/uL — ABNORMAL LOW (ref 3.87–5.11)
RDW: 14.7 % (ref 11.5–15.5)
WBC Count: 10.2 K/uL (ref 4.0–10.5)
nRBC: 0 % (ref 0.0–0.2)

## 2023-11-13 LAB — CBC MORPHOLOGY

## 2023-11-13 LAB — COMPREHENSIVE METABOLIC PANEL WITH GFR
AG Ratio: 1.6 (calc) (ref 1.0–2.5)
ALT: 11 U/L (ref 6–29)
AST: 12 U/L (ref 10–35)
Albumin: 3.7 g/dL (ref 3.6–5.1)
Alkaline phosphatase (APISO): 81 U/L (ref 37–153)
BUN/Creatinine Ratio: 16 (calc) (ref 6–22)
BUN: 39 mg/dL — ABNORMAL HIGH (ref 7–25)
CO2: 23 mmol/L (ref 20–32)
Calcium: 9.2 mg/dL (ref 8.6–10.4)
Chloride: 97 mmol/L — ABNORMAL LOW (ref 98–110)
Creat: 2.51 mg/dL — ABNORMAL HIGH (ref 0.60–0.95)
Globulin: 2.3 g/dL (ref 1.9–3.7)
Glucose, Bld: 85 mg/dL (ref 65–139)
Potassium: 3.3 mmol/L — ABNORMAL LOW (ref 3.5–5.3)
Sodium: 135 mmol/L (ref 135–146)
Total Bilirubin: 0.9 mg/dL (ref 0.2–1.2)
Total Protein: 6 g/dL — ABNORMAL LOW (ref 6.1–8.1)
eGFR: 19 mL/min/1.73m2 — ABNORMAL LOW (ref >=60)

## 2023-11-13 LAB — PREPARE RBC (CROSSMATCH)

## 2023-11-13 LAB — CMP (CANCER CENTER ONLY)
ALT: 8 U/L (ref 0–44)
AST: 11 U/L — ABNORMAL LOW (ref 15–41)
Albumin: 3.6 g/dL (ref 3.5–5.0)
Alkaline Phosphatase: 75 U/L (ref 38–126)
Anion gap: 13 (ref 5–15)
BUN: 42 mg/dL — ABNORMAL HIGH (ref 8–23)
CO2: 26 mmol/L (ref 22–32)
Calcium: 9.3 mg/dL (ref 8.9–10.3)
Chloride: 96 mmol/L — ABNORMAL LOW (ref 98–111)
Creatinine: 2.48 mg/dL — ABNORMAL HIGH (ref 0.44–1.00)
GFR, Estimated: 19 mL/min — ABNORMAL LOW (ref >60)
Glucose, Bld: 103 mg/dL — ABNORMAL HIGH (ref 70–99)
Potassium: 3 mmol/L — ABNORMAL LOW (ref 3.5–5.1)
Sodium: 135 mmol/L (ref 135–145)
Total Bilirubin: 1 mg/dL (ref 0.0–1.2)
Total Protein: 6.4 g/dL — ABNORMAL LOW (ref 6.5–8.1)

## 2023-11-13 LAB — RETICULOCYTES
Immature Retic Fract: 13.7 % (ref 2.3–15.9)
RBC.: 2.24 MIL/uL — ABNORMAL LOW (ref 3.87–5.11)
Retic Count, Absolute: 33.2 K/uL (ref 19.0–186.0)
Retic Ct Pct: 1.5 % (ref 0.4–3.1)

## 2023-11-13 LAB — DIRECT ANTIGLOBULIN TEST (NOT AT ARMC)
DAT, IgG: NEGATIVE
DAT, complement: NEGATIVE

## 2023-11-13 LAB — LACTATE DEHYDROGENASE: LDH: 411 U/L — ABNORMAL HIGH (ref 98–192)

## 2023-11-13 LAB — TROPONIN I (HIGH SENSITIVITY)
Troponin I (High Sensitivity): 24 ng/L — ABNORMAL HIGH (ref <18)
Troponin I (High Sensitivity): 26 ng/L — ABNORMAL HIGH (ref <18)

## 2023-11-13 LAB — TEST AUTHORIZATION

## 2023-11-13 LAB — SAMPLE TO BLOOD BANK

## 2023-11-13 LAB — TSH: TSH: 1.68 m[IU]/L (ref 0.40–4.50)

## 2023-11-13 LAB — BRAIN NATRIURETIC PEPTIDE: B Natriuretic Peptide: 169.9 pg/mL — ABNORMAL HIGH (ref 0.0–100.0)

## 2023-11-13 MED ORDER — FUROSEMIDE 10 MG/ML IJ SOLN
20.0000 mg | Freq: Once | INTRAMUSCULAR | Status: AC
  Administered 2023-11-13 (×2): 20 mg via INTRAVENOUS
  Filled 2023-11-13: qty 4

## 2023-11-13 MED ORDER — SODIUM CHLORIDE 0.9% IV SOLUTION
250.0000 mL | INTRAVENOUS | Status: DC
  Administered 2023-11-13 (×2): 250 mL via INTRAVENOUS

## 2023-11-13 MED ORDER — ACETAMINOPHEN 325 MG PO TABS
650.0000 mg | ORAL_TABLET | Freq: Four times a day (QID) | ORAL | Status: DC | PRN
Start: 2023-11-13 — End: 2023-11-15

## 2023-11-13 MED ORDER — SODIUM CHLORIDE 0.9% FLUSH: 10.0000 mL | INTRAVENOUS | Status: DC | PRN

## 2023-11-13 MED ORDER — ACETAMINOPHEN 650 MG RE SUPP: 650.0000 mg | Freq: Four times a day (QID) | RECTAL | Status: DC | PRN

## 2023-11-13 MED ORDER — ONDANSETRON HCL 4 MG/2ML IJ SOLN: 4.0000 mg | Freq: Four times a day (QID) | INTRAMUSCULAR | Status: DC | PRN

## 2023-11-13 MED ORDER — CHLORHEXIDINE GLUCONATE CLOTH 2 % EX PADS
6.0000 | MEDICATED_PAD | Freq: Every day | CUTANEOUS | Status: DC
  Administered 2023-11-14 – 2023-11-15 (×4): 6 via TOPICAL

## 2023-11-13 MED ORDER — SODIUM CHLORIDE 0.9% FLUSH
10.0000 mL | Freq: Once | INTRAVENOUS | Status: AC
  Administered 2023-11-13 (×2): 10 mL

## 2023-11-13 MED ORDER — ACETAMINOPHEN 325 MG PO TABS
650.0000 mg | ORAL_TABLET | Freq: Once | ORAL | Status: AC
  Administered 2023-11-13 (×2): 650 mg via ORAL
  Filled 2023-11-13: qty 2

## 2023-11-13 MED ORDER — SODIUM CHLORIDE 0.9% FLUSH
3.0000 mL | Freq: Two times a day (BID) | INTRAVENOUS | Status: DC
  Administered 2023-11-13 – 2023-11-15 (×8): 3 mL via INTRAVENOUS

## 2023-11-13 MED ORDER — POTASSIUM CHLORIDE CRYS ER 20 MEQ PO TBCR: 20.0000 meq | EXTENDED_RELEASE_TABLET | Freq: Once | ORAL | Status: DC

## 2023-11-13 MED ORDER — HEPARIN SOD (PORK) LOCK FLUSH 100 UNIT/ML IV SOLN: 250.0000 [IU] | INTRAVENOUS | Status: DC | PRN

## 2023-11-13 MED ORDER — SODIUM CHLORIDE 0.9% FLUSH: 3.0000 mL | INTRAVENOUS | Status: DC | PRN

## 2023-11-13 MED ORDER — NEPRO/CARBSTEADY PO LIQD
237.0000 mL | Freq: Two times a day (BID) | ORAL | Status: DC
  Administered 2023-11-14 – 2023-11-15 (×8): 237 mL via ORAL
  Filled 2023-11-13 (×4): qty 237

## 2023-11-13 MED ORDER — ASPIRIN 81 MG PO CHEW
81.0000 mg | CHEWABLE_TABLET | Freq: Every day | ORAL | Status: DC
  Administered 2023-11-14 – 2023-11-15 (×4): 81 mg via ORAL
  Filled 2023-11-13 (×2): qty 1

## 2023-11-13 MED ORDER — POTASSIUM CHLORIDE CRYS ER 20 MEQ PO TBCR
40.0000 meq | EXTENDED_RELEASE_TABLET | Freq: Once | ORAL | Status: AC
  Administered 2023-11-13 (×2): 40 meq via ORAL
  Filled 2023-11-13: qty 2

## 2023-11-13 MED ORDER — HEPARIN SOD (PORK) LOCK FLUSH 100 UNIT/ML IV SOLN
500.0000 [IU] | Freq: Every day | INTRAVENOUS | Status: DC | PRN
  Filled 2023-11-13: qty 5

## 2023-11-13 MED ORDER — FUROSEMIDE 10 MG/ML IJ SOLN
20.0000 mg | Freq: Once | INTRAMUSCULAR | Status: AC
  Administered 2023-11-13 (×2): 20 mg via INTRAVENOUS
  Filled 2023-11-13: qty 2

## 2023-11-13 MED ORDER — SODIUM CHLORIDE 0.9 % IV SOLN: 250.0000 mL | INTRAVENOUS | Status: AC | PRN

## 2023-11-13 MED ORDER — OXYCODONE HCL 5 MG PO TABS: 5.0000 mg | ORAL_TABLET | ORAL | Status: DC | PRN

## 2023-11-13 MED ORDER — SODIUM CHLORIDE 0.9% IV SOLUTION: Freq: Once | INTRAVENOUS | Status: AC

## 2023-11-13 MED ORDER — SODIUM CHLORIDE 0.9% FLUSH
10.0000 mL | Freq: Two times a day (BID) | INTRAVENOUS | Status: DC
  Administered 2023-11-14 – 2023-11-15 (×8): 10 mL

## 2023-11-13 MED ORDER — METHYLPREDNISOLONE SODIUM SUCC 40 MG IJ SOLR
40.0000 mg | Freq: Once | INTRAMUSCULAR | Status: AC
  Administered 2023-11-13 (×2): 40 mg via INTRAVENOUS
  Filled 2023-11-13: qty 1

## 2023-11-13 MED ORDER — POLYETHYLENE GLYCOL 3350 17 G PO PACK: 17.0000 g | PACK | Freq: Every day | ORAL | Status: DC | PRN

## 2023-11-13 MED ORDER — ONDANSETRON HCL 4 MG PO TABS: 4.0000 mg | ORAL_TABLET | Freq: Four times a day (QID) | ORAL | Status: DC | PRN

## 2023-11-13 NOTE — H&P (Signed)
 History and Physical    Patient: Jacqueline Hancock:[id number] DOB: 1940/08/25 DOA: 11/13/2023 DOS: the patient was seen and examined on 11/13/2023 PCP: NgetichRoxan BROCKS, NP  Patient coming from: Home  Chief Complaint:  Chief Complaint  Patient presents with   Abnormal Labs   HPI: Jacqueline Hancock is a 83 y.o. 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 Onesimo oncologist presented for follow-up and was found to have a hemoglobin of 6.6, worsening lower extremity edema and persistent abdominal pain right side at the site of her known abdominal mass.  Dr. Onesimo referred patient for admission for 2 units of packed red blood cell, IV Lasix  in between, evaluation for Autoimmune hemolytic anemia, palliative care consultation.  Patient reports persistent abdominal pain, since she has abdominal mass.  She reports her pain is not getting worse. Tramadol  is not helping with the pain.  She has  had a bowel movement the day prior to admission.  She denies any shortness of breath or chest pain.  She reports worsening lower extremity edema, she has had lower extremity edema over the last couple of month.  Reports at night swelling gets better, gets worse when she is up on her feet.  Evaluation in the ED: Sodium 135, potassium 3.0, chloride 96, glucose 103, BUN 42, creatinine 2.4, liver function test normal, low protein, troponin 24, LDH 411, BNP 169, white blood cell 10.2, hemoglobin 6.6, platelets 280 chest x-ray: Resolution of hazy pulmonary opacities noted on the previous study.  Borderline heart size.  EKG: Sinus rhythm.      Review of Systems: As mentioned in the history of present illness. All other systems reviewed and are  negative. Past Medical History:  Diagnosis Date   CLL (chronic lymphocytic leukemia) (HCC)    Congestive heart failure (CHF) (HCC) 09/2023   Dyspnea 2021   with exersion    Early cataracts, bilateral    Elevated hemoglobin A1c    Fibroids    GERD (gastroesophageal reflux disease)    Hyperlipidemia    Hypertension    Lymphoma (HCC)    Osteopenia    Past Surgical History:  Procedure Laterality Date   ABDOMINAL HYSTERECTOMY     APPENDECTOMY     BALLOON DILATION N/A 08/31/2023   Procedure: BALLOON DILATION;  Surgeon: Charlanne Groom, MD;  Location: [facility];  Service: Gastroenterology;  Laterality: N/A;   CATARACT EXTRACTION, BILATERAL Bilateral 09/2017   Dr. 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   TONSILLECTOMY AND ADENOIDECTOMY     Social History:  reports that she has never smoked. She has been exposed to tobacco smoke. She has never used smokeless tobacco. She reports that she does not drink alcohol and does not use drugs.  Allergies  Allergen Reactions   Peanut (Diagnostic) Itching    Peanut Butter.   Nexium [Esomeprazole Magnesium ] Other (See Comments)    Headache    Family History  Problem Relation Age of Onset   Leukemia Mother    Hypertension Father    Diabetes Sister  Heart disease Brother    Diabetes Brother    Diabetes Brother    Hypertension Son    Heart disease Son     Prior to Admission medications   Medication Sig Start Date End Date Taking? Authorizing Provider  aspirin  81 MG chewable tablet Chew 1 tablet (81 mg total) by mouth daily. 10/13/23  Yes Raenelle Coria, MD  empagliflozin  (JARDIANCE ) 10 MG TABS tablet Take 1 tablet (10 mg total) by mouth daily before breakfast. 10/16/23  Yes Hayes Beckey CROME,  NP  ondansetron  (ZOFRAN ) 8 MG tablet Take 8 mg by mouth every 8 (eight) hours as needed for nausea or vomiting.   Yes [provider]  pantoprazole  (PROTONIX ) 40 MG tablet Take 1 tablet (40 mg total) by mouth 2 (two) times daily for 56 days, THEN 1 tablet (40 mg total) daily. Patient taking differently: Take 1 tablet (40mg ) by mouth twice daily. 09/05/23 11/30/23 Yes Cindy Garnette POUR, MD  polyethylene glycol (MIRALAX  / GLYCOLAX ) 17 g packet Take 17 g by mouth daily as needed for mild constipation or moderate constipation. Also available OTC 06/18/23  Yes Rai, Ripudeep K, MD  torsemide  (DEMADEX ) 20 MG tablet Take 0.5 tablets (10 mg total) by mouth daily. 10/30/23  Yes Milford, Harlene HERO, FNP  traMADol  (ULTRAM ) 50 MG tablet Take 1 tablet (50 mg total) by mouth every 8 (eight) hours as needed for moderate pain (pain score 4-6) or severe pain (pain score 7-10) (pain). 06/18/23  Yes Rai, Ripudeep K, MD  entecavir  (BARACLUDE ) 0.5 MG tablet Take 1 tablet (0.5 mg total) by mouth every 3 (three) days. Patient not taking: Reported on 11/13/2023 11/01/23   Luiz Channel, MD  gabapentin  (NEURONTIN ) 100 MG capsule Take 1 capsule 3 x /day for Neuropathy Pain Patient not taking: Reported on 01/25/2020 08/02/19 01/25/20  Tonita Fallow, MD    Physical Exam: Vitals:   11/13/23 1232 11/13/23 1300 11/13/23 1330 11/13/23 1400  BP:  (!) 142/62 (!) 148/66 (!) 147/62  Pulse:  74 73 73  Resp:  20 19 (!) 21  Temp: 98 F (36.7 C)     TempSrc: Oral     SpO2:  100% 100% 100%  Weight:      Height:       General; Alert, conversant, in no distress CVS: S 1, S 2 RRR, ne JVD Lungs Bilateral air movement, CTA Abdomen: BS present, soft, abdominal deformity to right from mass.  Extremities; Plus 2 edema, no redness.  Neuro: alert, conversant, answer questions, follows commands.    Data Reviewed:  Labs reviewed.   Assessment and Plan: No notes have been filed under this hospital service. Service:  Hospitalist  1-Anemia, Symptomatic; In setting of Malignancy, Autoimmune hemolytic anemia -Plan to proceed with 2 units PRBC. Lasix  has been order for between transfusion.  - Evaluation for hemolytic anemia: Direct antiglobulin test pending, LDH 400, haptoglobin pending, retic count 1.5. - Oncology Dr. Sherre aware of admission and gave recommendation for care. 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 - Dr. Onesimo recommended palliative care consultation for goals of care, he discussed with patient supportive care through home with hospice and consideration for palliative radiation if is offered by radiation oncology. -  Palliative consulted - Further management per Dr. Onesimo  3-CKD stage IV:  Patient with CKD stage IV previous creatinine 1.7---2.0 Creatinine range has been increasing to 2.5 over the last 2 weeks Will proceed with checking renal ultrasound.  4-Chronic systolic heart failure ejection fraction 30% - Patient denies shortness of breath, she does have lower extremity edema, that could be related to venous insufficiency as well.  BNP not significantly elevated at 169.  Chest x-ray no significant pulmonary edema - She will receive IV Lasix  today with blood transfusion.  Will decide on further diuresis tomorrow depending on renal function  5-Bilateral lower extremity edema: Will proceed to check Doppler TED hose order  Hypokalemia: Replace orally   History of chronic active hepatitis B Follow-up as an outpatient     Advance Care Planning:   Code Status: Limited: Do not attempt resuscitation (DNR) -DNR-LIMITED -Do Not  Intubate/DNI  discussed with patient, and son who was at bedside.   Consults: Oncology, Palliative care  Family Communication: Son Who was at bedside  Severity of Illness: The appropriate patient status for this patient is OBSERVATION. Observation status is judged to be reasonable and necessary in order to provide the required intensity of service to ensure the patient's safety. The patient's presenting symptoms, physical exam findings, and initial radiographic and laboratory data in the context of their medical condition is felt to place them at decreased risk for further clinical deterioration. Furthermore, it is anticipated that the patient will be medically stable for discharge from the hospital within 2 midnights of admission.   Author: Owen DELENA Lore, MD 11/13/2023 2:15 PM  For on call review [email or web address].

## 2023-11-13 NOTE — Progress Notes (Signed)
 HEMATOLOGY/ONCOLOGY CLINIC NOTE  Date of Service: .11/13/2023  Patient Care Team: Ngetich, Roxan BROCKS, NP as PCP - General (Family Medicine) Kate Lonni CROME, MD as PCP - Cardiology (Cardiology) Arlana Arnt, MD as Consulting Physician (Otolaryngology) Luis Purchase, MD as Consulting Physician (Gastroenterology) Raeanne, Shanda SQUIBB, MD (Inactive) as Consulting Physician (Obstetrics and Gynecology) Roz Anes, MD as Consulting Physician (Ophthalmology) Quinn Odor, [facility] (Inactive) as Pharmacist (Pharmacist) Onesimo Emaline Brink, MD as Consulting Physician (Hematology)  CHIEF COMPLAINTS/PURPOSE OF CONSULTATION:  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 female who is here for continued evaluation and management of NHL, unspecified body region. Patient was following up with Dr. Amadeo.   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 BM Bx Cyclin D1 IHC +ve byt FISH neg . She has presented with splenomegaly, anemia, and lymphocytosis. 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   Patient's current treatment is Imbruvica  280 mg. She was started on Imbruvica  560 mg on January 2022, which was then reduced to 280 mg in July 2023.   Patient was last seen by Dr. Amadeo on 02/10/2022 and she was doing well overall.   Patient reports she has been doing well overall since our last visit. She does complains of itchy skin rashes, mostly around the face and head, for around 2-3 weeks ago. Patient notes that she was started couple of new medications from other physicians, but she is unsure of the names. Patient notes that she was started on oxycodone  around 3-4 weeks ago. She discontinued oxycodone .   She notes that there is redness around her face, which is not new.   Patient is currently taking Imbruvica   280 mg as prescribed and has been tolerating it well without any severe toxicities.   She is currently taking Iron supplement, calcium supplement, and Vitamin-D supplement.   She denies fever, chills, night sweats, unexpected weight loss, back pain, abdominal pain, chest pain, or leg swelling. Patient does complain of occasional abdominal pain.   Patient notes she had a surgery near her right neck/ear area to remove an enlarged lymph node couple years ago. She complains of mild occasional pain near the site.  INTERVAL HISTORY:  Jacqueline Hancock is a wonderful 83 y.o. 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 Since her last clinic visit she notes poor p.o. intake eating a few bites of food with each meal with early satiety and persistent/worsening right sided abdominal discomfort. She also notes increasing fatigue and was noted to have a hemoglobin of 6.6 today with symptomatic anemia. She did see infectious disease and continues to be on Entecavir  for her chronic active hepatitis B. She also was seen by rheumatology. Has not seen radiation oncology yet but has an appointment on 11/14/2023. She notes increasing lower extremity swelling. We again discussed goals of care in detail and the significant challenges with the typical treatment approach is needed to control her aggressive large B-cell lymphoma.  We cannot continue R mini CHOP since we will have to hold anthracycline in the context of her heart failure issues.  The use of R-CEOP with dose reductions or polatuzumab would also require significant follow-ups, transfusion support, face limitations due to her renal failure heart failure and poor nutritional status. We  discussed consideration of best supportive care through home hospice and consideration for palliative radiation if this is offered after radiation oncology  consultation. Patient remains undecided but is moving towards consideration of primarily palliative care's. She does want to be admitted for PRBC transfusion and to address her abdominal pain and leg swelling.   MEDICAL HISTORY:  Past Medical History:  Diagnosis Date   CLL (chronic lymphocytic leukemia) (HCC)    Congestive heart failure (CHF) (HCC) 09/2023   Dyspnea 2021   with exersion    Early cataracts, bilateral    Elevated hemoglobin A1c    Fibroids    GERD (gastroesophageal reflux disease)    Hyperlipidemia    Hypertension    Lymphoma (HCC)    Osteopenia     SURGICAL HISTORY: Past Surgical History:  Procedure Laterality Date   ABDOMINAL HYSTERECTOMY     APPENDECTOMY     BALLOON DILATION N/A 08/31/2023   Procedure: BALLOON DILATION;  Surgeon: Charlanne Groom, MD;  Location: [facility];  Service: Gastroenterology;  Laterality: N/A;   CATARACT EXTRACTION, BILATERAL Bilateral 09/2017   Dr. 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   TONSILLECTOMY AND ADENOIDECTOMY      SOCIAL HISTORY: Social History   Socioeconomic History   Marital status: Widowed    Spouse name: Not on file   Number of children: 3   Years of education: Not on file   Highest education level: Not on file  Occupational History   Occupation: retired  Tobacco Use   Smoking status: Never    Passive exposure: Past   Smokeless tobacco: Never  Vaping Use   Vaping status: Never Used  Substance and Sexual Activity   Alcohol use: No   Drug use: Never   Sexual activity: Not on file  Other Topics Concern   Not on file  Social History Narrative   Not on file   Social Drivers of Health   Financial Resource Strain: Low Risk  (10/11/2023)   Overall  Financial Resource Strain (CARDIA)    Difficulty of Paying Living Expenses: Not very hard  Food Insecurity: No Food Insecurity (10/06/2023)   Hunger Vital Sign    Worried About Running Out of Food in the Last Year: Never true    Ran Out of Food in the Last Year: Never true  Transportation Needs: No Transportation Needs (10/11/2023)   PRAPARE - Administrator, Civil Service (Medical): No    Lack of Transportation (Non-Medical): No  Physical Activity: Not on file  Stress: Not on file  Social Connections: Moderately Integrated (10/06/2023)   Social Connection and Isolation Panel    Frequency of Communication with Friends and Family: More than three times a week    Frequency of Social Gatherings with Friends and Family: Three times a week    Attends Religious Services: More than 4 times per year    Active Member of Clubs or Organizations: Not on file    Attends Club or Organization Meetings: More than 4 times per year    Marital Status: Widowed  Intimate Partner Violence: Patient Unable To Answer (10/06/2023)   Humiliation, Afraid, Rape, and Kick questionnaire    Fear of Current or Ex-Partner: Patient unable to answer    Emotionally Abused: Patient unable to  answer    Physically Abused: Patient unable to answer    Sexually Abused: Patient unable to answer    FAMILY HISTORY: Family History  Problem Relation Age of Onset   Leukemia Mother    Hypertension Father    Diabetes Sister    Heart disease Brother    Diabetes Brother    Diabetes Brother    Hypertension Son    Heart disease Son     ALLERGIES:  is allergic to peanut butter flavoring agent (non-screening) and nexium [esomeprazole magnesium ].  MEDICATIONS:  Current Outpatient Medications  Medication Sig Dispense Refill   aspirin  81 MG chewable tablet Chew 1 tablet (81 mg total) by mouth daily. 90 tablet 0   empagliflozin  (JARDIANCE ) 10 MG TABS tablet Take 1 tablet (10 mg total) by mouth daily before breakfast. 30 tablet  5   entecavir  (BARACLUDE ) 0.5 MG tablet Take 1 tablet (0.5 mg total) by mouth every 3 (three) days. 30 tablet 2   pantoprazole  (PROTONIX ) 40 MG tablet Take 1 tablet (40 mg total) by mouth 2 (two) times daily for 56 days, THEN 1 tablet (40 mg total) daily. 142 tablet 0   polyethylene glycol (MIRALAX  / GLYCOLAX ) 17 g packet Take 17 g by mouth daily as needed for mild constipation or moderate constipation. Also available OTC 30 each 0   prochlorperazine  (COMPAZINE ) 10 MG tablet Take 1 tablet (10 mg total) by mouth every 6 (six) hours as needed for nausea or vomiting. 30 tablet 6   torsemide  (DEMADEX ) 20 MG tablet Take 0.5 tablets (10 mg total) by mouth daily. 90 tablet 1   traMADol  (ULTRAM ) 50 MG tablet Take 1 tablet (50 mg total) by mouth every 8 (eight) hours as needed for moderate pain (pain score 4-6) or severe pain (pain score 7-10) (pain). 20 tablet 0   No current facility-administered medications for this visit.   Facility-Administered Medications Ordered in Other Visits  Medication Dose Route Frequency Provider Last Rate Last Admin   pegfilgrastim -cbqv (UDENYCA ) injection 6 mg  6 mg Subcutaneous Once Heran Campau Kishore, MD        REVIEW OF SYSTEMS:    .10 Point review of Systems was done is negative except as noted above.  PHYSICAL EXAMINATION: ECOG PERFORMANCE STATUS: 3  . Vitals:   11/13/23 1035  BP: 132/69  Pulse: 80  Resp: 18  Temp: (!) 97.3 F (36.3 C)  SpO2: 100%     Filed Weights   11/13/23 1035  Weight: 117 lb 9.6 oz (53.3 kg)   . Wt Readings from Last 3 Encounters:  11/13/23 117 lb 9.6 oz (53.3 kg)  11/10/23 120 lb 6.4 oz (54.6 kg)  11/06/23 118 lb 6.4 oz (53.7 kg)     .Body mass index is 23.75 kg/m.   GENERAL:alert, mild distress due to abdominal distention and discomfort. SKIN: no acute rashes, no significant lesions EYES: conjunctiva are pink and non-injected, sclera anicteric OROPHARYNX: MMM, no exudates, no oropharyngeal erythema or  ulceration NECK: supple, no JVD LYMPH:  no palpable lymphadenopathy in the cervical, axillary or inguinal regions LUNGS: clear to auscultation b/l with normal respiratory effort HEART: regular rate & rhythm ABDOMEN: Abdominal distention with tenderness to palpation over the epigastric area and right side of abdomen . extremity: 2+ pitting pedal edema PSYCH: alert & oriented x 3 with fluent speech NEURO: no focal motor/sensory deficits   LABORATORY DATA:  I have reviewed the data as listed  .    Latest Ref Rng & Units 11/13/2023  10:20 AM 11/10/2023   11:07 AM 10/25/2023   10:20 AM  CBC  WBC 4.0 - 10.5 K/uL 10.2  10.2  11.6   Hemoglobin 12.0 - 15.0 g/dL 6.6  6.9  8.7   Hematocrit 36.0 - 46.0 % 20.1  21.7  30.8   Platelets 150 - 400 K/uL 280  295  188    .    Latest Ref Rng & Units 11/13/2023   10:20 AM 11/10/2023   11:07 AM 10/25/2023   10:20 AM  CMP  Glucose 70 - 99 mg/dL 896  85  895   BUN 8 - 23 mg/dL 42  39  44   Creatinine 0.44 - 1.00 mg/dL 7.51  7.48  7.85   Sodium 135 - 145 mmol/L 135  135  135   Potassium 3.5 - 5.1 mmol/L 3.0  3.3  3.4   Chloride 98 - 111 mmol/L 96  97  93   CO2 22 - 32 mmol/L 26  23  31    Calcium 8.9 - 10.3 mg/dL 9.3  9.2  9.1   Total Protein 6.5 - 8.1 g/dL 6.4  6.0  6.5   Total Bilirubin 0.0 - 1.2 mg/dL 1.0  0.9  0.9   Alkaline Phos 38 - 126 U/L 75   98   AST 15 - 41 U/L 11  12  14    ALT 0 - 44 U/L 8  11  9     . Lab Results  Component Value Date   LDH 624 (H) 08/28/2023   Component     Latest Ref Rng 08/18/2023  HBV DNA SERPL PCR-ACNC     IU/mL 110   HBV DNA SERPL PCR-LOG IU     log10 IU/mL 2.041   Test Info: Comment   Hepatitis A AB,Total     NON-REACTIVE  REACTIVE !   HCV Ab     NON REACTIVE  NON REACTIVE   HEP B CORE AB     Negative  Positive !   Hepatitis B Surface Ag     NON REACTIVE  Reactive !   Uric Acid, Serum     2.5 - 7.1 mg/dL     Legend: ! Abnormal   PATHOLOGY Surgical Pathology CASE: [id number] PATIENT: Avnoor  Blevens Bone Marrow Report     Clinical History: lymphocytosis, possible lymphoma     DIAGNOSIS:  BONE MARROW, ASPIRATE, CLOT, CORE: -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  The latter is primarily seen by flow cytometry.  The findings favor mantle cell lymphoma but correlation with cytogenetic and FISH studies is recommended.  MICROSCOPIC DESCRIPTION:  PERIPHERAL BLOOD SMEAR: The red blood cells display prominent anisocytosis with macrocytic and normocytic cells.  There is mild to moderate poikilocytosis with teardrop cells, elliptocytes.  There is moderate polychromasia.  The white blood cells are increased in number with lymphocytosis characterized by predominance of small to medium sized lymphoid cells displaying high nuclear cytoplasmic ratio, round to irregular nuclei, coarse chromatin and small to inconspicuous nucleoli. The platelets are normal in number.     RADIOGRAPHIC STUDIES: I have personally reviewed the radiological images as listed and agreed with the findings in the report. NM PET Image Restag (PS) Skull Base To Thigh Result Date: 10/25/2023 CLINICAL DATA:  Subsequent treatment strategy for diffuse large B-cell lymphoma. EXAM: NUCLEAR MEDICINE PET SKULL BASE TO THIGH TECHNIQUE: 6.3 mCi F-18 FDG was injected intravenously. Full-ring PET imaging was performed from the skull base to thigh after the radiotracer. CT data was obtained and used for attenuation correction and anatomic localization. Fasting blood glucose: 88 mg/dl COMPARISON:  [id number]/85/7974 FINDINGS: Mediastinal blood pool activity: SUV max 2.2 Liver activity: SUV max 3.3 NECK: No  significant abnormal hypermetabolic activity in this region. Incidental CT findings: None. CHEST: No significant abnormal hypermetabolic activity in this region. Incidental CT findings: Right Port-A-Cath tip: Lower right atrium. Mild cardiomegaly. Trace pericardial effusion. Atheromatous vascular calcification in the thoracic aorta. 4 mm left lower lobe nodule on image 65 series 4 previously shown to be calcified hence likely benign. 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 Anterior components of this process has a maximum SUV of 21.8 (Deauville 5), previously 27.0. As before, this process fills the perirenal space extending into the retroperitoneum root of the mesentery region and tracking down towards the right pelvic sidewall. The degree of distal periureteral high activity is reduced. Newly hypermetabolic right pelvic sidewall lymph node measures 0.7 cm in short axis on image 146 series 4, maximum SUV 9.2 (Deauville 5). This previously measured 0.6 cm on the PET-CT from 07/17/2023, with maximum SUV 1.5. Right inguinal node measuring 0.7 cm in short axis on image 151 series 4 with maximum SUV 5.1 (Deauville 4), previously 0.7 cm in diameter with maximum SUV 2.4. Focal abnormal hypermetabolic activity along the anterior dome of the right hepatic lobe capsular margin, maximum SUV 8.5 (Deauville 5). The associated hypermetabolic activity measures approximately 1.3 cm in diameter. This could be along the diaphragm or hepatic capsule rather than necessarily being intrahepatic. Incidental CT findings: Atherosclerosis is present, including aortoiliac atherosclerotic disease. Mild ascites. Prominent stool throughout the colon favors constipation. Diffuse subcutaneous edema. SKELETON: No significant abnormal hypermetabolic activity in this region. Incidental CT findings: None. IMPRESSION: 1. Large right perinephric,  periureteral, and retroperitoneal mass with Deauville 5 activity again observed with severe associated distortion of the right kidney. The degree of distal periureteral high activity is reduced. 2. Newly hypermetabolic right pelvic sidewall lymph node and right inguinal lymph node, both Deauville 5. 3. Focal abnormal hypermetabolic activity along the anterior dome of the right hepatic lobe capsular margin, Deauville 5. 4. Mild ascites. Diffuse subcutaneous edema. 5. Prominent stool throughout the colon favors constipation. 6.  Aortic Atherosclerosis (ICD10-I70.0). Electronically Signed   By: Ryan Salvage M.D.   On: 10/25/2023 06:57    ASSESSMENT & PLAN:   83 y.o. woman with:   1.  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 Though discussed with patient that FISH was neg and so Mantle cell lymphoma not confirm. Findings more consistent with CLL.  2.  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  3.  High risk of tumor lysis syndrome  4.  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  5) Hypercalcemia - ? From dehydration vs recurrent lymphoma  6) hepatitis B chronic active disease  PLAN:  - Discussed patient's labs from today with her in details. - She is having significant symptomatic anemia with a drop in her hemoglobin to 6.6 with increasing leg swelling and right-sided abdominal pain. -  We discussed goals of care in details and the significant challenges with the typical treatment approach is needed to control her aggressive large B-cell lymphoma.  We cannot continue R mini CHOP since we will have to hold anthracycline in the context of her heart failure issues.  The use of R-CEOP with dose reductions or polatuzumab would also require significant follow-ups,  transfusion support, face limitations due to her renal failure heart failure and poor nutritional status. We discussed consideration of best supportive care through home hospice and consideration for palliative radiation if this is offered after radiation oncology consultation. Patient remains undecided but is moving towards consideration of primarily palliative care's. She does want to be admitted for PRBC transfusion and to address her abdominal pain and leg swelling. -Patient is being sent to the emergency room - Would recommend checking LDH and haptoglobin levels and reticulocyte count and coombs test and if signs of warm antibody autoimmune hemolytic anemia would be to start her on Prednisone  1mg /kg - Transfuse to units PRBCs each over 4 hours to avoid heart failure issues.  Lasix  20 mg IV prior to each unit of blood. - Additional diuretic supports as per hospital medicine/ER physicians. - Might consult radiation oncology as inpatient to consider palliative radiation. - Would also recommend palliative care consultation for continued goals of care discussion. - Patient would be reasonable to consider best supportive cares through hospice. - If she chooses additional palliative interventions with radiation and other systemic therapies she does understand that she would be at high risk of repeated hospitalizations or complications. - Oncology will continue to follow-patient - Patient's son was with her during our detailed goals of care discussion.  The total time spent in the appointment was 40 minutes*.  All of the patient's questions were answered with apparent satisfaction. The patient knows to call the clinic with any problems, questions or concerns.   Emaline Saran MD MS AAHIVMS [facility] Hematology/Oncology Physician [facility]  .*Total Encounter Time as defined by the Centers for Medicare and Medicaid Services includes, in addition to the face-to-face time of a patient  visit (documented in the note above) non-face-to-face time: obtaining and reviewing outside history, ordering and reviewing medications, tests or procedures, care coordination (communications with other health care professionals or caregivers) and documentation in the medical record.

## 2023-11-13 NOTE — ED Provider Notes (Signed)
 [place] EMERGENCY DEPARTMENT AT [facility] Provider Note   CSN: [id number] Arrival date & time: 11/13/23  1137     Patient presents with: Abnormal Labs   Jacqueline Hancock is a 83 y.o. female.   HPI 83 year old female presents after being sent down from the cancer center for anemia.  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  She saw oncology today and was sent down here for admission and blood transfusion.  Patient has also been having weeks of stable lower extremity swelling.  She has been feeling weak for a couple weeks but denies chest pain or shortness of breath.  No black or bloody stools or bleeding.  She has chronic right-sided abdominal pain from her cancer that is unchanged from baseline.  Prior to Admission medications   Medication Sig Start Date End Date Taking? Authorizing Provider  aspirin  81 MG chewable tablet Chew 1 tablet (81 mg total) by mouth daily. 10/13/23  Yes Raenelle Coria, MD  empagliflozin  (JARDIANCE ) 10 MG TABS tablet Take 1 tablet (10 mg total) by mouth daily before breakfast. 10/16/23  Yes Hayes Beckey CROME, NP  ondansetron  (ZOFRAN ) 8 MG tablet Take 8 mg by mouth every 8 (eight) hours as needed for nausea or vomiting.   Yes [provider]  pantoprazole  (PROTONIX ) 40 MG tablet Take 1 tablet (40 mg total) by mouth 2 (two) times daily for 56 days, THEN 1 tablet (40 mg total) daily. Patient taking differently: Take 1 tablet (40mg ) by mouth twice daily. 09/05/23 11/30/23 Yes Cindy Garnette POUR, MD  polyethylene glycol (MIRALAX  / GLYCOLAX ) 17 g packet Take 17 g by mouth daily as needed for mild constipation or moderate constipation. Also available OTC 06/18/23  Yes Rai, Ripudeep K, MD  torsemide  (DEMADEX ) 20 MG tablet Take 0.5 tablets (10 mg total) by mouth daily. 10/30/23  Yes [place], Harlene HERO, FNP  traMADol  (ULTRAM ) 50 MG tablet Take 1 tablet (50 mg total) by mouth every 8 (eight) hours as needed for  moderate pain (pain score 4-6) or severe pain (pain score 7-10) (pain). 06/18/23  Yes Rai, Ripudeep K, MD  entecavir  (BARACLUDE ) 0.5 MG tablet Take 1 tablet (0.5 mg total) by mouth every 3 (three) days. Patient not taking: Reported on 11/13/2023 11/01/23   Luiz Channel, MD  gabapentin  (NEURONTIN ) 100 MG capsule Take 1 capsule 3 x /day for Neuropathy Pain Patient not taking: Reported on 01/25/2020 08/02/19 01/25/20  Tonita Fallow, MD    Allergies: Peanut (diagnostic) and Nexium [esomeprazole magnesium ]    Review of Systems  Constitutional:  Positive for fatigue.  Respiratory:  Negative for shortness of breath.   Cardiovascular:  Negative for chest pain.  Gastrointestinal:  Positive for abdominal pain. Negative for blood in stool.    Updated Vital Signs BP (!) 141/65   Pulse 76   Temp 98 F (36.7 C) (Oral)   Resp 14   Ht 4' 11 (1.499 m)   Wt 53.3 kg   SpO2 100%   BMI 23.75 kg/m   Physical Exam Vitals and nursing note reviewed.  Constitutional:      General: She is not in acute distress.    Appearance: She is well-developed. She is not ill-appearing or diaphoretic.  HENT:     Head: Normocephalic and atraumatic.  Cardiovascular:     Rate and Rhythm: Normal rate and regular rhythm.     Heart sounds: Normal heart sounds.  Pulmonary:  Effort: Pulmonary effort is normal.     Breath sounds: Normal breath sounds.     Comments: Mildly decreased breath sounds left compared to right Abdominal:     Palpations: Abdomen is soft.     Tenderness: There is abdominal tenderness (mild, right sided, chronic per patient).  Musculoskeletal:     Right lower leg: Edema present.     Left lower leg: Edema present.     Comments: Bilateral pitting edema to the lower legs/ankles  Skin:    General: Skin is warm and dry.  Neurological:     Mental Status: She is alert.     (all labs ordered are listed, but only abnormal results are displayed) Labs Reviewed  RETICULOCYTES - Abnormal;  Notable for the following components:      Result Value   RBC. 2.24 (*)    All other components within normal limits  LACTATE DEHYDROGENASE - Abnormal; Notable for the following components:   LDH 411 (*)    All other components within normal limits  BRAIN NATRIURETIC PEPTIDE - Abnormal; Notable for the following components:   B Natriuretic Peptide 169.9 (*)    All other components within normal limits  TROPONIN I (HIGH SENSITIVITY) - Abnormal; Notable for the following components:   Troponin I (High Sensitivity) 24 (*)    All other components within normal limits  HAPTOGLOBIN  PREPARE RBC (CROSSMATCH)  DIRECT ANTIGLOBULIN TEST (NOT AT [facility])  TYPE AND SCREEN  TROPONIN I (HIGH SENSITIVITY)    EKG: EKG Interpretation Date/Time:  Monday November 13 2023 12:36:01 EDT Ventricular Rate:  74 PR Interval:  125 QRS Duration:  83 QT Interval:  449 QTC Calculation: 499 R Axis:   21  Text Interpretation: Sinus rhythm Abnormal R-wave progression, early transition Borderline prolonged QT interval overall similar to July 2025 Confirmed by Freddi Hamilton [phone]) on 11/13/2023 12:38:19 PM  Radiology: DG Chest Portable 1 View Result Date: 11/13/2023 EXAM: 1 VIEW XRAY OF THE CHEST 11/13/2023 12:22:57 PM COMPARISON: AP radiograph of the chest dated 10/06/2023. CLINICAL HISTORY: CHF. Per chart: Pt [facility] from [facility] for HGB of 6.6. H/x non hodgkins lymphoma, HF, Hep B. FINDINGS: LUNGS AND PLEURA: The hazy pulmonary opacities noted on the previous study have resolved in the interim. No pleural effusion. No pneumothorax. HEART AND MEDIASTINUM: The heart is borderline in size. No acute abnormality of the mediastinal silhouette. BONES AND SOFT TISSUES: No acute osseous abnormality. An endotracheal tube and gastric tube have been removed. A right internal jugular chest port remains in place. IMPRESSION: 1. Resolution of hazy pulmonary opacities noted on the previous study. 2. Borderline heart size.  Electronically signed by: evalene coho 11/13/2023 01:39 PM EDT RP Workstation: [id number]     .Critical Care  Performed by: Freddi Hamilton, MD Authorized by: Freddi Hamilton, MD   Critical care provider statement:    Critical care time (minutes):  30   Critical care time was exclusive of:  Separately billable procedures and treating other patients   Critical care was necessary to treat or prevent imminent or life-threatening deterioration of the following conditions:  Circulatory failure   Critical care was time spent personally by me on the following activities:  Development of treatment plan with patient or surrogate, discussions with consultants, evaluation of patient's response to treatment, examination of patient, ordering and review of laboratory studies, ordering and review of radiographic studies, ordering and performing treatments and interventions, pulse oximetry, re-evaluation of patient's condition and review of old charts  Medications Ordered in the ED  furosemide  (LASIX ) injection 20 mg (has no administration in time range)  0.9 %  sodium chloride  infusion (Manually program via Guardrails IV Fluids) (has no administration in time range)  potassium chloride  SA (KLOR-CON  M) CR tablet 40 mEq (40 mEq Oral Given 11/13/23 1228)  furosemide  (LASIX ) injection 20 mg (20 mg Intravenous Given 11/13/23 1228)                                    Medical Decision Making Amount and/or Complexity of Data Reviewed External Data Reviewed: notes.    Details: Oncology notes Labs: ordered.    Details: Mildly elevated troponin, mildly elevated BNP Radiology: ordered and independent interpretation performed.    Details: No CHF ECG/medicine tests: ordered and independent interpretation performed.    Details: No ischemia  Risk Prescription drug management. Decision regarding hospitalization.   Patient presents with anemia.  Sent down here from the oncology office for admission.  She  has stable but chronic abdominal pain from this large mass that is known.  However she does not feel like this is worse, I do not think an emergent CT is needed as she is not having vomiting or new/worsening abdominal pain.  She was started on blood transfusion with Lasix  being given first.  Has significant CHF but does not appear to be in distress.  Labs and x-rays have been ordered.  I discussed with Dr. Regalado for admission.     Final diagnoses:  Symptomatic anemia    ED Discharge Orders     None          Freddi Hamilton, MD 11/13/23 [phone]

## 2023-11-13 NOTE — Plan of Care (Signed)

## 2023-11-13 NOTE — Telephone Encounter (Signed)
 CRITICAL VALUE STICKER  CRITICAL VALUE:  Hgb 6.6  RECEIVER (on-site recipient of call):Onyx Edgley, LPN  DATE & TIME NOTIFIED: 11/13/23 10:42  MESSENGER (representative from lab): Harlene  MD NOTIFIED: Emaline Saran, MD  TIME OF NOTIFICATION: 10:42  RESPONSE:

## 2023-11-13 NOTE — ED Triage Notes (Signed)
 Pt presents from [facility] for HGB of 6.6. H/x non hodgkins lymphoma, HF, Hep B

## 2023-11-13 NOTE — Progress Notes (Signed)
 Pt taken to ED. Hgb 6.6, port remains accessed, blood orders placed for 2 units. Report given.

## 2023-11-13 NOTE — Progress Notes (Signed)
 Radiation Oncology         [phone] ________________________________  Initial inpatient Consultation  Name: Jacqueline Hancock MRN: [id number]  Date: 11/14/2023  DOB: [birth year]  RR:Whzupry, Roxan BROCKS, NP  Onesimo Emaline Brink, MD   REFERRING PHYSICIAN: Onesimo Emaline Brink, MD  DIAGNOSIS:    ICD-10-CM   1. 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    CHIEF COMPLAINT: Here to discuss management of B-cell Lymphoma  HISTORY OF PRESENT ILLNESS::Jacqueline Hancock is a 83 y.o. 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  (BM Bx Cyclin D1 IHC +ve byt FISH neg). 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 Onesimo for and Dr. Amadeo. Current treatment consist of R-miniCHOP treatment with Rituxan .  Most recent PET scan performed on 10/19/23 showed a large right perinephric, periureteral, and retroperitoneal mass with severe associated distortion of the right kidney; newly hypermetabolic right pelvic sidewall lymph node and right inguinal lymph node, both Deauville 5; and focal abnormal hypermetabolic activity along the anterior dome of the right hepatic lobe capsular margin, also Deauville 5.  She returned for a follow up with Dr. Onesimo on 10/25/23 to discuss further options. Given the degree of her response, her disease was showing resistance patterns. Discussed options of switching systemic treatment to Etoposide, palliative care with radiation, or hospice. A that time, the patient wished to think about her decision with her family. R-miniCHOP treatment was held at that time.   Patient presented for a follow-up with Dr. Onesimo on 11/13/23 and was found to have a a hemoglobin of 6.6, worsening lower extremity edema and persistent abdominal pain right  side at the site of her known abdominal mass. Per Dr. Maryla recommendation, she was referred to the ED and transferred into inpatient care. After again discussion treatment options, the patient remained undecided but was moving towards consideration of primarily palliative care.  Patient notes intermittent abdominal pain. She has been given Ultram , but this has not been helpful for her. Her son states that the oxycodone  she has been given in the hospital has been helping with the pain. She notes nausea with food intake, and denies any diarrhea or constipation.    PATHOLOGY from 08-04-23:  A. RETROPERITONEAL MASS, BIOPSY: -  B-cell lymphoproliferative disorder.  Note: The needle core biopsies consists of lymphocytes with a vaguely nodular pattern.  These lymphocytes range in size from small to medium to larger forms many with a prominent nucleoli suggestive of a paraimmunoblasts) in addition there are also larger pleomorphic type cells scattered throughout which have a convoluted nuclear membrane, prominent cherry-red nucleoli and questionable Polly lobation suggestive of Hodgkin/Dahlen-Sternberg type cells.  All the cells of interest are CD20 positive B cells which coexpress CD23.  CD3 highlights scattered small T cells and CD5 correlates with the CD3 positive T cells and is essentially negative on the cells of interest.  There is blush BCL6 positivity to the cells of interest.  The cells are overall positive for Bcl-2.  CD30 is essentially negative, including the morphologic HRS type cells.  CD10, CD15, EBV ISH are negative.  The cells also stain with both PAX5 and MUM1.  Cyclin D1 ranges from weak to moderate to focally strong positive in the more nodular areas but is  negative in the smaller/more moderate-sized cells outside of these nodular areas. Proliferation rate by Ki-67 is also predominantly negative in the smaller/moderate cells compared to the more nodular areas.  These nodular  areas vary in size and extent depending on the area of core biopsy but focally show coalescence.  It should be noted that there is also an area of necrosis.  The history of chronic lymphocytic leukemia/small lymphocytic lymphoma is noted.  While CD5 appears to have been lost, the above findings would be consistent with the patient's known chronic lymphocytic leukemia; however, given the presence of prominent, confluent/coalescent proliferation centers a diagnosis of so-called aggressive/accelerated CLL is favored.  In addition given the area of necrosis while not morphologically identified a Richter's transformation cannot be completely excluded given possible sampling error.  Clinical correlation recommended.    GROSS DESCRIPTION:  Received in saline are cores of soft white tissue measuring 1.5 to 2.2 cm in length and each less than 0.1 cm in diameter.  A portion of the specimen is placed in RPMI.  The remaining tissue is entirely submitted in 1 cassette.  (GRP 08/04/2023)   Final Diagnosis performed by Mark LeGolvan DO.   Electronically signed 08/09/2023 Technical and / or Professional components performed at [facility], [address] [place], [place] [address].  Immunohistochemistry Technical component (if applicable) was performed at [facility]. [address], [address], [place], [place] [address].   IMMUNOHISTOCHEMISTRY DISCLAIMER (if applicable): Some of these immunohistochemical stains may have been developed and the performance characteristics determine by [facility]. Some may not have been cleared or approved by the U.S. Food and Drug Administration. The FDA has determined that such clearance or approval is not necessary. This test is used for clinical purposes. It should not be regarded as investigational or for research. This laboratory is certified under the Clinical Laboratory Improvement Amendments of  1988 (CLIA-88) as qualified to perform high complexity clinical laboratory testing.  The controls stained appropriately.   IHC stains are performed on formalin fixed, paraffin embedded tissue using a 3,3diaminobenzidine (DAB) chromogen and Leica Bond Autostainer System. The staining intensity of the nucleus is score manually and is reported as the percentage of tumor cell nuclei demonstrating specific nuclear staining. The specimens are fixed in 10% Neutral Formalin for at least 6 hours and up to 72hrs. These tests are validated on decalcified tissue. Results should be interpreted with caution given the possibility of false negative results on decalcified specimens. Antibody Clones are as follows ER-clone 17F, PR-clone 16, Ki67- clone MM1. Some of these immunohistochemical stains may have been developed and the performance characteristics determined by [facility].   REVIOUS RADIATION THERAPY: No  PAST MEDICAL HISTORY:  has a past medical history of CLL (chronic lymphocytic leukemia) (HCC), Congestive heart failure (CHF) (HCC) (09/2023), Dyspnea (2021), Early cataracts, bilateral, Elevated hemoglobin A1c, Fibroids, GERD (gastroesophageal reflux disease), Hyperlipidemia, Hypertension, Lymphoma (HCC), and Osteopenia.    PAST SURGICAL HISTORY: Past Surgical History:  Procedure Laterality Date   ABDOMINAL HYSTERECTOMY     APPENDECTOMY     BALLOON DILATION N/A 08/31/2023   Procedure: BALLOON DILATION;  Surgeon: Charlanne Groom, MD;  Location: [facility];  Service: Gastroenterology;  Laterality: N/A;   CATARACT EXTRACTION, BILATERAL Bilateral 09/2017   Dr. Anselmo   ESOPHAGOGASTRODUODENOSCOPY N/A 08/31/2023   Procedure: EGD (ESOPHAGOGASTRODUODENOSCOPY);  Surgeon: Charlanne Groom, MD;  Location: [facility];  Service: Gastroenterology;  Laterality: N/A;   IR IMAGING GUIDED PORT INSERTION  08/21/2023  PAROTID GLAND TUMOR EXCISION  2012   benign   TONSILLECTOMY AND ADENOIDECTOMY       FAMILY HISTORY: family history includes Diabetes in her brother, brother, and sister; Heart disease in her brother and son; Hypertension in her father and son; Leukemia in her mother.  SOCIAL HISTORY:  reports that she has never smoked. She has been exposed to tobacco smoke. She has never used smokeless tobacco. She reports that she does not drink alcohol and does not use drugs.  ALLERGIES: Peanut (diagnostic) and Nexium [esomeprazole magnesium ]  MEDICATIONS:  No current facility-administered medications for this encounter.   No current outpatient medications on file.   Facility-Administered Medications Ordered in Other Encounters  Medication Dose Route Frequency Provider Last Rate Last Admin   0.9 %  sodium chloride  infusion (Manually program via Guardrails IV Fluids)  250 mL Intravenous Continuous Onesimo Emaline Brink, MD 10 mL/hr at 11/13/23 1815 250 mL at 11/13/23 1815   0.9 %  sodium chloride  infusion  250 mL Intravenous PRN Regalado, Belkys A, MD       acetaminophen  (TYLENOL ) tablet 650 mg  650 mg Oral Q6H PRN Regalado, Belkys A, MD       Or   acetaminophen  (TYLENOL ) suppository 650 mg  650 mg Rectal Q6H PRN Regalado, Belkys A, MD       aspirin  chewable tablet 81 mg  81 mg Oral Daily Regalado, Belkys A, MD   81 mg at 11/14/23 0825   Chlorhexidine  Gluconate Cloth 2 % PADS 6 each  6 each Topical Daily Regalado, Belkys A, MD   6 each at 11/14/23 0825   feeding supplement (NEPRO CARB STEADY) liquid 237 mL  237 mL Oral BID BM Regalado, Belkys A, MD   237 mL at 11/14/23 1235   heparin  lock flush 100 unit/mL  500 Units Intracatheter Daily PRN Onesimo Emaline Brink, MD       heparin  lock flush 100 unit/mL  250 Units Intracatheter PRN Onesimo Emaline Brink, MD       ondansetron  (ZOFRAN ) tablet 4 mg  4 mg Oral Q6H PRN Regalado, Belkys A, MD       Or   ondansetron  (ZOFRAN ) injection 4 mg  4 mg Intravenous Q6H PRN Regalado, Belkys A, MD       oxyCODONE  (Oxy IR/ROXICODONE ) immediate release  tablet 5 mg  5 mg Oral Q4H PRN Regalado, Belkys A, MD       pegfilgrastim -cbqv (UDENYCA ) injection 6 mg  6 mg Subcutaneous Once Kale, Gautam Kishore, MD       polyethylene glycol (MIRALAX  / GLYCOLAX ) packet 17 g  17 g Oral Daily PRN Regalado, Belkys A, MD       potassium chloride  SA (KLOR-CON  M) CR tablet 20 mEq  20 mEq Oral Once Regalado, Belkys A, MD       sodium chloride  flush (NS) 0.9 % injection 10 mL  10 mL Intracatheter PRN Onesimo Emaline Brink, MD       sodium chloride  flush (NS) 0.9 % injection 10-40 mL  10-40 mL Intracatheter Q12H Regalado, Belkys A, MD   10 mL at 11/14/23 0825   sodium chloride  flush (NS) 0.9 % injection 10-40 mL  10-40 mL Intracatheter PRN Regalado, Belkys A, MD       sodium chloride  flush (NS) 0.9 % injection 3 mL  3 mL Intravenous Q12H Regalado, Belkys A, MD   3 mL at 11/14/23 0825   sodium chloride  flush (NS) 0.9 % injection 3 mL  3 mL Intravenous  PRN Regalado, Belkys A, MD       sodium chloride  flush (NS) 0.9 % injection 3 mL  3 mL Intracatheter PRN Kale, Gautam Kishore, MD       torsemide  (DEMADEX ) tablet 20 mg  20 mg Oral Daily Regalado, Belkys A, MD        REVIEW OF SYSTEMS:  Notable for that above.   PHYSICAL EXAM:  height is 4' 11 (1.499 m). Her temperature is 97.5 F (36.4 C) (abnormal). Her blood pressure is 153/70 (abnormal) and her pulse is 81. Her respiration is 20 and oxygen saturation is 100%.   General: Alert and oriented, in no acute distress  HEENT: Head is normocephalic. Extraocular movements are intact.  Heart: Regular in rate and rhythm with no murmurs, rubs, or gallops. Chest: Clear to auscultation bilaterally, with no rhonchi, wheezes, or rales. Abdomen: Soft, nontender, distended, with no rigidity or guarding. Extremities: No cyanosis or edema. Lymphatics: see Neck Exam Skin: No concerning lesions. Musculoskeletal: symmetric strength and muscle tone throughout. Ambulating in a wheelchair.  Neurologic: Cranial nerves II through XII are  grossly intact. No obvious focalities. Speech is fluent. Coordination is intact. Psychiatric: Judgment and insight are intact. Affect is appropriate.   ECOG = 2  0 - Asymptomatic (Fully active, able to carry on all predisease activities without restriction)  1 - Symptomatic but completely ambulatory (Restricted in physically strenuous activity but ambulatory and able to carry out work of a light or sedentary nature. For example, light housework, office work)  2 - Symptomatic, <50% in bed during the day (Ambulatory and capable of all self care but unable to carry out any work activities. Up and about more than 50% of waking hours)  3 - Symptomatic, >50% in bed, but not bedbound (Capable of only limited self-care, confined to bed or chair 50% or more of waking hours)  4 - Bedbound (Completely disabled. Cannot carry on any self-care. Totally confined to bed or chair)  5 - Death   Raylene MM, Creech RH, Tormey DC, et al. [phone]). Toxicity and response criteria of the [facility]. Am. DOROTHA Bridges. Oncol. 5 (6): 649-55   LABORATORY DATA:  Lab Results  Component Value Date   WBC 12.0 (H) 11/14/2023   HGB 11.2 (L) 11/14/2023   HCT 33.8 (L) 11/14/2023   MCV 86.0 11/14/2023   PLT 287 11/14/2023   CMP     Component Value Date/Time   NA 134 (L) 11/14/2023 1009   K 3.0 (L) 11/14/2023 1009   CL 98 11/14/2023 1009   CO2 22 11/14/2023 1009   GLUCOSE 140 (H) 11/14/2023 1009   BUN 47 (H) 11/14/2023 1009   CREATININE 2.41 (H) 11/14/2023 1009   CREATININE 2.48 (H) 11/13/2023 1020   CREATININE 2.51 (H) 11/10/2023 1107   CALCIUM 9.1 11/14/2023 1009   CALCIUM 12.8 (H) 06/15/2023 0916   PROT 6.4 (L) 11/14/2023 1009   ALBUMIN  2.8 (L) 11/14/2023 1009   AST 12 (L) 11/14/2023 1009   AST 11 (L) 11/13/2023 1020   ALT 9 11/14/2023 1009   ALT 8 11/13/2023 1020   ALKPHOS 74 11/14/2023 1009   BILITOT 1.0 11/14/2023 1009   BILITOT 1.0 11/13/2023 1020   EGFR 19 (L) 11/10/2023 1107    GFRNONAA 19 (L) 11/14/2023 1009   GFRNONAA 19 (L) 11/13/2023 1020   GFRNONAA 45 (L) 08/07/2020 1115         RADIOGRAPHY: VAS US  LOWER EXTREMITY VENOUS (DVT) Result Date: 11/14/2023  Lower Venous  DVT Study Patient Name:  ROZELL KETTLEWELL  Date of Exam:   11/14/2023 Medical Rec #: [id number]    Accession #:    [id number] Date of Birth: 1941/01/27    Patient Gender: F Patient Age:   9 years Exam Location:  [facility] Procedure:      VAS US  LOWER EXTREMITY VENOUS (DVT) Referring Phys: OWEN REGALADO --------------------------------------------------------------------------------  Indications: Edema.  Performing Technologist: Vernon Lindau, RVT  Examination Guidelines: A complete evaluation includes B-mode imaging, spectral Doppler, color Doppler, and power Doppler as needed of all accessible portions of each vessel. Bilateral testing is considered an integral part of a complete examination. Limited examinations for reoccurring indications may be performed as noted. The reflux portion of the exam is performed with the patient in reverse Trendelenburg.  +---------+---------------+---------+-----------+----------+--------------+ RIGHT    CompressibilityPhasicitySpontaneityPropertiesThrombus Aging +---------+---------------+---------+-----------+----------+--------------+ CFV      Full           Yes      Yes                                 +---------+---------------+---------+-----------+----------+--------------+ SFJ      Full                                                        +---------+---------------+---------+-----------+----------+--------------+ FV Prox  Full                                                        +---------+---------------+---------+-----------+----------+--------------+ FV Mid   Full                                                        +---------+---------------+---------+-----------+----------+--------------+ FV DistalFull                                                         +---------+---------------+---------+-----------+----------+--------------+ PFV      Full                                                        +---------+---------------+---------+-----------+----------+--------------+ POP      Full           Yes      Yes                                 +---------+---------------+---------+-----------+----------+--------------+ PTV      Full                                                        +---------+---------------+---------+-----------+----------+--------------+  PERO     Full                                                        +---------+---------------+---------+-----------+----------+--------------+   +---------+---------------+---------+-----------+----------+--------------+ LEFT     CompressibilityPhasicitySpontaneityPropertiesThrombus Aging +---------+---------------+---------+-----------+----------+--------------+ CFV      Full           Yes      Yes                                 +---------+---------------+---------+-----------+----------+--------------+ SFJ      Full                                                        +---------+---------------+---------+-----------+----------+--------------+ FV Prox  Full                                                        +---------+---------------+---------+-----------+----------+--------------+ FV Mid   Full                                                        +---------+---------------+---------+-----------+----------+--------------+ FV DistalFull                                                        +---------+---------------+---------+-----------+----------+--------------+ PFV      Full                                                        +---------+---------------+---------+-----------+----------+--------------+ POP      Full           Yes      Yes                                  +---------+---------------+---------+-----------+----------+--------------+ PTV      Full                                                        +---------+---------------+---------+-----------+----------+--------------+ PERO     Full                                                        +---------+---------------+---------+-----------+----------+--------------+  Summary: RIGHT: - There is no evidence of deep vein thrombosis in the lower extremity.  - No cystic structure found in the popliteal fossa.  LEFT: - There is no evidence of deep vein thrombosis in the lower extremity.  - No cystic structure found in the popliteal fossa.  *See table(s) above for measurements and observations.    Preliminary    US  RENAL Result Date: 11/13/2023 CLINICAL DATA:  Acute kidney injury.  Diffuse large B-cell lymphoma. EXAM: RENAL / URINARY TRACT ULTRASOUND COMPLETE COMPARISON:  PET CT 10/19/2023. CT abdomen and pelvis 10/06/2023. FINDINGS: Right Kidney: Heterogeneous mass is seen in the region of the right kidney measuring 20.3 x 10.1 x 16.1 cm. Normal renal parenchyma is unable to be differentiated from this mass. Overall dimensions are similar to prior CT given differences in technique. Left Kidney: Renal measurements: 12.2 x 5.0 x 4.8 cm = volume: 152 mL. Echogenicity within normal limits. There is mild hydronephrosis. No focal lesion identified. Bladder: Appears normal for degree of bladder distention. Other: None. IMPRESSION: 1. Grossly unchanged heterogeneous mass in the region of the right kidney measuring 20.3 x 10.1 x 16.1 cm. Normal renal parenchyma is unable to be differentiated from this mass. 2. Mild left hydronephrosis. Electronically Signed   By: Greig Pique M.D.   On: 11/13/2023 16:38   DG Chest Portable 1 View Result Date: 11/13/2023 EXAM: 1 VIEW XRAY OF THE CHEST 11/13/2023 12:22:57 PM COMPARISON: AP radiograph of the chest dated 10/06/2023. CLINICAL HISTORY: CHF. Per chart: Pt presents  from [facility] for HGB of 6.6. H/x non hodgkins lymphoma, HF, Hep B. FINDINGS: LUNGS AND PLEURA: The hazy pulmonary opacities noted on the previous study have resolved in the interim. No pleural effusion. No pneumothorax. HEART AND MEDIASTINUM: The heart is borderline in size. No acute abnormality of the mediastinal silhouette. BONES AND SOFT TISSUES: No acute osseous abnormality. An endotracheal tube and gastric tube have been removed. A right internal jugular chest port remains in place. IMPRESSION: 1. Resolution of hazy pulmonary opacities noted on the previous study. 2. Borderline heart size. Electronically signed by: evalene coho 11/13/2023 01:39 PM EDT RP Workstation: [id number]   NM PET Image Restag (PS) Skull Base To Thigh Result Date: 10/25/2023 CLINICAL DATA:  Subsequent treatment strategy for diffuse large B-cell lymphoma. EXAM: NUCLEAR MEDICINE PET SKULL BASE TO THIGH TECHNIQUE: 6.3 mCi F-18 FDG was injected intravenously. Full-ring PET imaging was performed from the skull base to thigh after the radiotracer. CT data was obtained and used for attenuation correction and anatomic localization. Fasting blood glucose: 88 mg/dl COMPARISON:  5/85/7974 FINDINGS: Mediastinal blood pool activity: SUV max 2.2 Liver activity: SUV max 3.3 NECK: No significant abnormal hypermetabolic activity in this region. Incidental CT findings: None. CHEST: No significant abnormal hypermetabolic activity in this region. Incidental CT findings: Right Port-A-Cath tip: Lower right atrium. Mild cardiomegaly. Trace pericardial effusion. Atheromatous vascular calcification in the thoracic aorta. 4 mm left lower lobe nodule on image 65 series 4 previously shown to be calcified hence likely benign. 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 Anterior components of this process has a maximum SUV of 21.8 (Deauville 5),  previously 27.0. As before, this process fills the perirenal space extending into the retroperitoneum root of the mesentery region and tracking down towards the right pelvic sidewall. The degree of distal periureteral high activity is reduced. Newly hypermetabolic right pelvic sidewall lymph node measures 0.7 cm in short axis  on image 146 series 4, maximum SUV 9.2 (Deauville 5). This previously measured 0.6 cm on the PET-CT from 07/17/2023, with maximum SUV 1.5. Right inguinal node measuring 0.7 cm in short axis on image 151 series 4 with maximum SUV 5.1 (Deauville 4), previously 0.7 cm in diameter with maximum SUV 2.4. Focal abnormal hypermetabolic activity along the anterior dome of the right hepatic lobe capsular margin, maximum SUV 8.5 (Deauville 5). The associated hypermetabolic activity measures approximately 1.3 cm in diameter. This could be along the diaphragm or hepatic capsule rather than necessarily being intrahepatic. Incidental CT findings: Atherosclerosis is present, including aortoiliac atherosclerotic disease. Mild ascites. Prominent stool throughout the colon favors constipation. Diffuse subcutaneous edema. SKELETON: No significant abnormal hypermetabolic activity in this region. Incidental CT findings: None. IMPRESSION: 1. Large right perinephric, periureteral, and retroperitoneal mass with Deauville 5 activity again observed with severe associated distortion of the right kidney. The degree of distal periureteral high activity is reduced. 2. Newly hypermetabolic right pelvic sidewall lymph node and right inguinal lymph node, both Deauville 5. 3. Focal abnormal hypermetabolic activity along the anterior dome of the right hepatic lobe capsular margin, Deauville 5. 4. Mild ascites. Diffuse subcutaneous edema. 5. Prominent stool throughout the colon favors constipation. 6.  Aortic Atherosclerosis (ICD10-I70.0). Electronically Signed   By: Ryan Salvage M.D.   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   She has a guarded prognosis and has considered comfort measures only, but is interested in palliative radiation if it may relieve some of her symptoms.  We reviewed this patient's current workup.  She presents today with a large right abdominal mass, consistent with her large B-cell lymphoma. This mass is unfortunately causing post-prandial nausea and abdominal pain, interfering with her quality of life.  Given her overall response and medical comorbidities, Dr. Onesimo does not recommend proceeding with further R-miniCHOP treatment.  Patient is interested in pursuing palliative treatment at this time.  Dr. Izell recommends a palliative course of radiation to the symptomatic abdominal mass.  Today, we talked to the patient and family about the findings and work-up thus far.  We discussed the natural history of CLL and general treatment, highlighting the role of radiotherapy in the management.  We discussed the available radiation techniques, and focused on the details of logistics and delivery.  We reviewed the anticipated acute and late sequelae associated with radiation in this setting.  The patient was encouraged to ask questions that I answered to the best of my ability. A patient consent form was discussed and signed.  We retained a copy for our records.  The patient and her family would like to proceed with radiation. (We called the patient's son today to share our discussion. She expressed understanding and is in agreement with the stated plan.)  Patient is scheduled for CT simulation later today. Dr. Izell anticipates 17.5 Gy in 5 fractions directed at the bulky abdominal mass. She is scheduled to begin her first treatment on 11/15/2023. We look forward to participating in this patient's care.   Given her h/o tumor lysis syndrome and the  fact that she is starting radiation tomorrow, we'd like to make sure she has adequate labwork to screen for this in case of an abrupt response to radiation. If she is getting discharged soon, we are happy to order outpatient labs.  We have reached out to the inpatient  team and Dr. Onesimo to coordinate labwork accordingly and avoid redundancy.  On date of service, in total, we spent 60 minutes on this encounter. Patient was seen in person.   __________________________________________    Leeroy Due, PA-C   Lauraine Golden, MD    [facility]  Radiation Oncology Direct Dial: [phone]  Fax: [phone] [email or web address]    This document serves as a record of services personally performed by Lauraine Golden, MD. It was created on her behalf by Reymundo Cartwright, a trained medical scribe. The creation of this record is based on the scribe's personal observations and the provider's statements to them. This document has been checked and approved by the attending provider.

## 2023-11-14 ENCOUNTER — Encounter: Payer: Self-pay | Admitting: Hematology

## 2023-11-14 ENCOUNTER — Ambulatory Visit
Admission: RE | Admit: 2023-11-14 | Discharge: 2023-11-14 | Disposition: A | Discharge status: Home / Self Care | Source: Ambulatory Visit | Attending: Radiation Oncology | Admitting: Radiation Oncology

## 2023-11-14 ENCOUNTER — Other Ambulatory Visit (HOSPITAL_COMMUNITY)

## 2023-11-14 ENCOUNTER — Observation Stay (HOSPITAL_BASED_OUTPATIENT_CLINIC_OR_DEPARTMENT_OTHER)

## 2023-11-14 ENCOUNTER — Ambulatory Visit
Admit: 2023-11-14 | Discharge: 2023-11-14 | Disposition: A | Discharge status: Home / Self Care | Attending: Radiation Oncology | Admitting: Radiation Oncology

## 2023-11-14 VITALS — BP 153/70 | HR 81 | Temp 97.5°F | Resp 20 | Ht 59.0 in

## 2023-11-14 DIAGNOSIS — Z515 Encounter for palliative care: Secondary | ICD-10-CM

## 2023-11-14 DIAGNOSIS — Z7189 Other specified counseling: Secondary | ICD-10-CM

## 2023-11-14 DIAGNOSIS — Z510 Encounter for antineoplastic radiation therapy: Secondary | ICD-10-CM | POA: Insufficient documentation

## 2023-11-14 DIAGNOSIS — C8338 Diffuse large B-cell lymphoma, lymph nodes of multiple sites: Secondary | ICD-10-CM

## 2023-11-14 DIAGNOSIS — R609 Edema, unspecified: Secondary | ICD-10-CM

## 2023-11-14 DIAGNOSIS — C8513 Unspecified B-cell lymphoma, intra-abdominal lymph nodes: Secondary | ICD-10-CM | POA: Insufficient documentation

## 2023-11-14 DIAGNOSIS — C9110 Chronic lymphocytic leukemia of B-cell type not having achieved remission: Secondary | ICD-10-CM

## 2023-11-14 DIAGNOSIS — D649 Anemia, unspecified: Secondary | ICD-10-CM | POA: Diagnosis not present

## 2023-11-14 LAB — BPAM RBC
Blood Product Expiration Date: 202509062359
Blood Product Expiration Date: 202509062359
ISSUE DATE / TIME: 202508111437
ISSUE DATE / TIME: 202508111845
Unit Type and Rh: 7300
Unit Type and Rh: 7300

## 2023-11-14 LAB — COMPREHENSIVE METABOLIC PANEL WITH GFR
ALT: 9 U/L (ref 0–44)
AST: 12 U/L — ABNORMAL LOW (ref 15–41)
Albumin: 2.8 g/dL — ABNORMAL LOW (ref 3.5–5.0)
Alkaline Phosphatase: 74 U/L (ref 38–126)
Anion gap: 14 (ref 5–15)
BUN: 47 mg/dL — ABNORMAL HIGH (ref 8–23)
CO2: 22 mmol/L (ref 22–32)
Calcium: 9.1 mg/dL (ref 8.9–10.3)
Chloride: 98 mmol/L (ref 98–111)
Creatinine, Ser: 2.41 mg/dL — ABNORMAL HIGH (ref 0.44–1.00)
GFR, Estimated: 19 mL/min — ABNORMAL LOW (ref >60)
Glucose, Bld: 140 mg/dL — ABNORMAL HIGH (ref 70–99)
Potassium: 3 mmol/L — ABNORMAL LOW (ref 3.5–5.1)
Sodium: 134 mmol/L — ABNORMAL LOW (ref 135–145)
Total Bilirubin: 1 mg/dL (ref 0.0–1.2)
Total Protein: 6.4 g/dL — ABNORMAL LOW (ref 6.5–8.1)

## 2023-11-14 LAB — CBC
HCT: 33.8 % — ABNORMAL LOW (ref 36.0–46.0)
Hemoglobin: 11.2 g/dL — ABNORMAL LOW (ref 12.0–15.0)
MCH: 28.5 pg (ref 26.0–34.0)
MCHC: 33.1 g/dL (ref 30.0–36.0)
MCV: 86 fL (ref 80.0–100.0)
Platelets: 287 K/uL (ref 150–400)
RBC: 3.93 MIL/uL (ref 3.87–5.11)
RDW: 14.3 % (ref 11.5–15.5)
WBC: 12 K/uL — ABNORMAL HIGH (ref 4.0–10.5)
nRBC: 0 % (ref 0.0–0.2)

## 2023-11-14 LAB — TYPE AND SCREEN
ABO/RH(D): B POS
Antibody Screen: NEGATIVE
Unit division: 0
Unit division: 0

## 2023-11-14 LAB — HAPTOGLOBIN: Haptoglobin: 363 mg/dL — ABNORMAL HIGH (ref 41–333)

## 2023-11-14 MED ORDER — POTASSIUM CHLORIDE CRYS ER 20 MEQ PO TBCR
20.0000 meq | EXTENDED_RELEASE_TABLET | Freq: Once | ORAL | Status: AC
  Administered 2023-11-14 (×2): 20 meq via ORAL
  Filled 2023-11-14: qty 1

## 2023-11-14 MED ORDER — TORSEMIDE 20 MG PO TABS
20.0000 mg | ORAL_TABLET | Freq: Every day | ORAL | Status: DC
  Administered 2023-11-14 – 2023-11-15 (×4): 20 mg via ORAL
  Filled 2023-11-14 (×2): qty 1

## 2023-11-14 NOTE — Progress Notes (Signed)
 BLE venous exam is completed. Bronda Alfred, RVT

## 2023-11-14 NOTE — Consult Note (Addendum)
 Palliative Care Consult Note                                  Date: 11/14/2023   Patient Name: Jacqueline Hancock  DOB: 05-13-1940  MRN: [id number]  Age / Sex: 83 y.o., female  PCP: Ngetich, Roxan BROCKS, NP Referring Physician: Madelyne Owen LABOR, MD  Reason for Consultation: Establishing goals of care  HPI/Patient Profile: 83 y.o. 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 She  presented to the ED from the oncology  clinic on 11/13/2023 with hemoglobin of 6.6, and was admitted with symptomatic anemia.   Palliative Medicine has been consulted for goals of care discussions.  Clinical Assessment and Goals of Care:   Extensive chart review has been completed including labs, vital signs, imaging, progress/consult notes, orders, medications and available advance directive documents.  I met with patient at bedside to discuss diagnosis, prognosis, and GOC.  Patient is known to PMT from previous hospitalizations. I re-introduced Palliative Medicine as specialized medical care for people living with serious illness. It focuses on providing relief from the symptoms and stress of a serious illness.   Created space and opportunity for patient to express thoughts and feelings regarding current medical situation. Values and goals of care were attempted to be elicited.  Social History: Patient lives at home with her son/David. She has 2 other sons. 1 lives locally and 1 lives out of state.   Discussion: Patient reports ongoing fatigue. She denies abdominal pain. She tells me she has more bad days than good, and feels this is related to her blood counts.   We discussed patient's current illness and what it means in the larger context of her ongoing co-morbidities. Current clinical  status was reviewed. Patient understands she has non-curable cancer, and intent of treatment is palliative in nature.  We discussed that per oncology, additional treatment with radiation and chemotherapy will have high risk for complications and rehospitalization. Patient is willing to proceed with palliative radiation, but is unsure if she wants additional chemotherapy.   I discussed with patient my recommendation to consider hospice support at home if she decides not to pursue additional chemotherapy. Discussed that the philosophy of hospice is around comfort and dignity, while allowing the natural course to occur. I explained that hospice would be extra care for patient, support for family, and assistance with symptom management when she further declines.   Provided education and counseling at length on the philosophy and benefits of hospice care. Discussed that it offers a holistic approach to care in the setting of end-stage illness/disease, and is about supporting the patient where they are allowing nature to take it's course. Hospice can help a patient feel as good as possible for as long as possible. Discussed the hospice team includes RNs, physicians, social workers, and chaplains. They can provide personal care, support for the family, and help keep patient out of the hospital.  Patient is unsure about hospice, but does state she is ready when God takes her and shares that she is at peace when that time comes.   Discussed the importance of continued conversation with the medical team regarding overall plan of care and treatment options.  Questions and concerns addressed. Emotional support provided.   Review of  Systems  Constitutional:  Positive for fatigue.    Objective:   Primary Diagnoses: Present on Admission:  Anemia   Physical Exam Vitals reviewed.  Constitutional:      General: She is not in acute distress.    Comments: Chronically ill-appearing  Pulmonary:      Effort: Pulmonary effort is normal.  Neurological:     Mental Status: She is alert and oriented to person, place, and time.    Palliative Assessment/Data: PPS 40-50%     Assessment & Plan:   SUMMARY OF RECOMMENDATIONS   Continue current supportive interventions Plan to proceed with palliative radiation Patient unsure whether she wants additional chemotherapy Recommended home hospice if she decides not to do chemotherapy PMT will continue to follow  Primary Decision Maker: PATIENT with support from her son/David  Existing Vynca/ACP Documentation: None  Code Status/Advance Care Planning: DNR  Symptom Management:  Per attending  Prognosis:  < 6 months would not be surprising  Discharge Planning:  To Be Determined    Thank you for allowing us  to participate in the care of KARMINA ZUFALL   Time Total: 78 minutes  Detailed review of medical records (labs, imaging, vital signs), medically appropriate exam, discussed with treatment team, counseling and education to patient, family, & staff, documenting clinical information, coordination of care.   Signed by: Recardo Loll, NP Palliative Medicine Team  Team Phone # [phone]  For individual providers, please see AMION

## 2023-11-14 NOTE — Care Management Obs Status (Signed)
 MEDICARE OBSERVATION STATUS NOTIFICATION   Patient Details  Name: ELIETTE DRUMWRIGHT MRN: [id number] Date of Birth: 1941/01/11   Medicare Observation Status Notification Given:  Yes    Toy LITTIE Agar, RN 11/14/2023, 11:34 AM

## 2023-11-14 NOTE — Progress Notes (Signed)
   11/14/23 1521  TOC Brief Assessment  Insurance and Status Reviewed  Patient has primary care physician Yes (Ngetich, Dinah C, NP)  Home environment has been reviewed From home  Prior level of function: Independent  Prior/Current Home Services No current home services  Social Drivers of Health Review SDOH reviewed no interventions necessary  Readmission risk has been reviewed Yes  Transition of care needs no transition of care needs at this time

## 2023-11-14 NOTE — Plan of Care (Signed)

## 2023-11-14 NOTE — Progress Notes (Signed)
 BLE venous exam is completed. Teffany Blaszczyk, RVT

## 2023-11-14 NOTE — Progress Notes (Signed)
 PROGRESS NOTE    Jacqueline Hancock  FMW:[id number] DOB: Mar 31, 1941 DOA: 11/13/2023 PCP: Leonarda Roxan BROCKS, NP   Brief Narrative:  83 y.o. 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 Onesimo oncologist presented for follow-up and was found to have a hemoglobin of 6.6, worsening lower extremity edema and persistent abdominal pain right side at the site of her known abdominal mass.  Dr. Onesimo referred patient for admission for 2 units of packed red blood cell, IV Lasix  in between, evaluation for Autoimmune hemolytic anemia, palliative care consultation.   Patient denies worsening of abdominal pain, if she has tramadol  is not helping with pain control.  Assessment & Plan:   Principal Problem:   Anemia  1-Anemia, Symptomatic; In setting of Malignancy, Autoimmune hemolytic anemia -Received  2 units PRBC. Received lasix  between transfusion.  - Evaluation for hemolytic anemia: Direct antiglobulin test negative, LDH 400, haptoglobin 363 not low. Retic count 1.5. - Oncology Dr. Onesimo aware of admission and gave recommendation for care.  -hb increased to 11 post transfusion.  Monitor.    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 - Dr. Onesimo recommended palliative care consultation for goals of care, he discussed with patient supportive care through home with hospice and consideration for palliative radiation if is offered by radiation oncology. - Palliative  consulted - Further management per Dr. Onesimo  -Radiation oncology has been consulted.  Continue with oxycodone  for pain management.   3-CKD stage IV:  Patient with CKD stage IV previous creatinine 1.7---2.0 Creatinine range has been increasing to 2.5 over the last 2 weeks Renal US  showed large mass right kidney. Discussed with Urology, no urologic intervention can be offer for this finding.    4-Chronic systolic heart failure ejection fraction 30% - Patient denies shortness of breath, she does have lower extremity edema, that could be related to venous insufficiency as well.  BNP not significantly elevated at 169.  Chest x-ray no significant pulmonary edema - Resume Torsemide  today. LE edema improved.  Monitor renal function on resumption of torsemide .    5-Bilateral lower extremity edema: Doppler: Negative for DVT TED hose order   Hypokalemia: Replace orally     History of chronic active hepatitis B Follow-up as an outpatient    Estimated body mass index is 23.75 kg/m as calculated from the following:   Height as of this encounter: 4' 11 (1.499 m).   Weight as of this encounter: 53.3 kg.   DVT prophylaxis: SCD due to anemia Code Status: DNR Family Communication: Son at bedside.  Disposition Plan:  Status is: Observation The patient remains OBS appropriate and will d/c before 2 midnights.    Consultants:  Oncology   Procedures:  Doppler, negative for DVT  Antimicrobials:    Subjective: She is feeling better, report LE edema improved.  Abdominal pain better controlled with oxycodone .   Objective: Vitals:   11/13/23 1915 11/14/23 0223 11/14/23 0229 11/14/23 0520  BP: (!) 156/63 134/76  138/78  Pulse: 90 72 72 72  Resp: 16 18  18   Temp: 98 F (  36.7 C)  (!) 97.4 F (36.3 C) (!) 97.4 F (36.3 C)  TempSrc: Oral  Oral Oral  SpO2: 100%  100% 100%  Weight:      Height:        Intake/Output Summary (Last 24 hours) at 11/14/2023 1406 Last data filed at  11/14/2023 0500 Gross per 24 hour  Intake 572.5 ml  Output 340 ml  Net 232.5 ml   Filed Weights   11/13/23 1140  Weight: 53.3 kg    Examination:  General exam: NAD Respiratory system: Clear to auscultation. Respiratory effort normal. Cardiovascular system: S1 & S2 heard, RRR.  Gastrointestinal system: Abdomen is nondistended, soft and tender. Mass right side Central nervous system: Alert and oriented.  Extremities: plus 1 edema  Data Reviewed: I have personally reviewed following labs and imaging studies  CBC: Recent Labs  Lab 11/10/23 1107 11/13/23 1020 11/14/23 1009  WBC 10.2 10.2 12.0*  NEUTROABS 8,191* 8.6*  --   HGB 6.9* 6.6* 11.2*  HCT 21.7* 20.1* 33.8*  MCV 87.1 84.5 86.0  PLT 295 280 287   Basic Metabolic Panel: Recent Labs  Lab 11/10/23 1107 11/13/23 1020 11/14/23 1009  NA 135 135 134*  K 3.3* 3.0* 3.0*  CL 97* 96* 98  CO2 23 26 22   GLUCOSE 85 103* 140*  BUN 39* 42* 47*  CREATININE 2.51* 2.48* 2.41*  CALCIUM 9.2 9.3 9.1   GFR: Estimated Creatinine Clearance: 13.2 mL/min (A) (by C-G formula based on SCr of 2.41 mg/dL (H)). Liver Function Tests: Recent Labs  Lab 11/10/23 1107 11/13/23 1020 11/14/23 1009  AST 12 11* 12*  ALT 11 8 9   ALKPHOS  --  75 74  BILITOT 0.9 1.0 1.0  PROT 6.0* 6.4* 6.4*  ALBUMIN   --  3.6 2.8*   No results for input(s): LIPASE, AMYLASE in the last 168 hours. No results for input(s): AMMONIA in the last 168 hours. Coagulation Profile: No results for input(s): INR, PROTIME in the last 168 hours. Cardiac Enzymes: No results for input(s): CKTOTAL, CKMB, CKMBINDEX, TROPONINI in the last 168 hours. BNP (last 3 results) Recent Labs    02/21/23 1206  PROBNP 14.0   HbA1C: No results for input(s): HGBA1C in the last 72 hours. CBG: No results for input(s): GLUCAP in the last 168 hours. Lipid Profile: No results for input(s): CHOL, HDL, LDLCALC, TRIG, CHOLHDL, LDLDIRECT in the last 72  hours. Thyroid  Function Tests: No results for input(s): TSH, T4TOTAL, FREET4, T3FREE, THYROIDAB in the last 72 hours. Anemia Panel: Recent Labs    11/13/23 1237  RETICCTPCT 1.5   Sepsis Labs: No results for input(s): PROCALCITON, LATICACIDVEN in the last 168 hours.  No results found for this or any previous visit (from the past 240 hours).       Radiology Studies: VAS US  LOWER EXTREMITY VENOUS (DVT) Result Date: 11/14/2023  Lower Venous DVT Study Patient Name:  PERRY MOLLA  Date of Exam:   11/14/2023 Medical Rec #: [id number]    Accession #:    [id number] Date of Birth: 1940/07/09    Patient Gender: F Patient Age:   47 years Exam Location:  [facility] Procedure:      VAS US  LOWER EXTREMITY VENOUS (DVT) Referring Phys: OWEN Trudie Cervantes --------------------------------------------------------------------------------  Indications: Edema.  Performing Technologist: Vernon Lindau, RVT  Examination Guidelines: A complete evaluation includes B-mode imaging, spectral Doppler, color Doppler, and power Doppler as needed of all accessible portions of each vessel. Bilateral testing is considered an integral part  of a complete examination. Limited examinations for reoccurring indications may be performed as noted. The reflux portion of the exam is performed with the patient in reverse Trendelenburg.  +---------+---------------+---------+-----------+----------+--------------+ RIGHT    CompressibilityPhasicitySpontaneityPropertiesThrombus Aging +---------+---------------+---------+-----------+----------+--------------+ CFV      Full           Yes      Yes                                 +---------+---------------+---------+-----------+----------+--------------+ SFJ      Full                                                        +---------+---------------+---------+-----------+----------+--------------+ FV Prox  Full                                                         +---------+---------------+---------+-----------+----------+--------------+ FV Mid   Full                                                        +---------+---------------+---------+-----------+----------+--------------+ FV DistalFull                                                        +---------+---------------+---------+-----------+----------+--------------+ PFV      Full                                                        +---------+---------------+---------+-----------+----------+--------------+ POP      Full           Yes      Yes                                 +---------+---------------+---------+-----------+----------+--------------+ PTV      Full                                                        +---------+---------------+---------+-----------+----------+--------------+ PERO     Full                                                        +---------+---------------+---------+-----------+----------+--------------+   +---------+---------------+---------+-----------+----------+--------------+ LEFT     CompressibilityPhasicitySpontaneityPropertiesThrombus Aging +---------+---------------+---------+-----------+----------+--------------+ CFV      Full  Yes      Yes                                 +---------+---------------+---------+-----------+----------+--------------+ SFJ      Full                                                        +---------+---------------+---------+-----------+----------+--------------+ FV Prox  Full                                                        +---------+---------------+---------+-----------+----------+--------------+ FV Mid   Full                                                        +---------+---------------+---------+-----------+----------+--------------+ FV DistalFull                                                         +---------+---------------+---------+-----------+----------+--------------+ PFV      Full                                                        +---------+---------------+---------+-----------+----------+--------------+ POP      Full           Yes      Yes                                 +---------+---------------+---------+-----------+----------+--------------+ PTV      Full                                                        +---------+---------------+---------+-----------+----------+--------------+ PERO     Full                                                        +---------+---------------+---------+-----------+----------+--------------+     Summary: RIGHT: - There is no evidence of deep vein thrombosis in the lower extremity.  - No cystic structure found in the popliteal fossa.  LEFT: - There is no evidence of deep vein thrombosis in the lower extremity.  - No cystic structure found in the popliteal fossa.  *See table(s) above for measurements and observations.    Preliminary    US  RENAL Result  Date: 11/13/2023 CLINICAL DATA:  Acute kidney injury.  Diffuse large B-cell lymphoma. EXAM: RENAL / URINARY TRACT ULTRASOUND COMPLETE COMPARISON:  PET CT 10/19/2023. CT abdomen and pelvis 10/06/2023. FINDINGS: Right Kidney: Heterogeneous mass is seen in the region of the right kidney measuring 20.3 x 10.1 x 16.1 cm. Normal renal parenchyma is unable to be differentiated from this mass. Overall dimensions are similar to prior CT given differences in technique. Left Kidney: Renal measurements: 12.2 x 5.0 x 4.8 cm = volume: 152 mL. Echogenicity within normal limits. There is mild hydronephrosis. No focal lesion identified. Bladder: Appears normal for degree of bladder distention. Other: None. IMPRESSION: 1. Grossly unchanged heterogeneous mass in the region of the right kidney measuring 20.3 x 10.1 x 16.1 cm. Normal renal parenchyma is unable to be differentiated from this mass. 2. Mild  left hydronephrosis. Electronically Signed   By: Greig Pique M.D.   On: 11/13/2023 16:38   DG Chest Portable 1 View Result Date: 11/13/2023 EXAM: 1 VIEW XRAY OF THE CHEST 11/13/2023 12:22:57 PM COMPARISON: AP radiograph of the chest dated 10/06/2023. CLINICAL HISTORY: CHF. Per chart: Pt presents from [facility] for HGB of 6.6. H/x non hodgkins lymphoma, HF, Hep B. FINDINGS: LUNGS AND PLEURA: The hazy pulmonary opacities noted on the previous study have resolved in the interim. No pleural effusion. No pneumothorax. HEART AND MEDIASTINUM: The heart is borderline in size. No acute abnormality of the mediastinal silhouette. BONES AND SOFT TISSUES: No acute osseous abnormality. An endotracheal tube and gastric tube have been removed. A right internal jugular chest port remains in place. IMPRESSION: 1. Resolution of hazy pulmonary opacities noted on the previous study. 2. Borderline heart size. Electronically signed by: evalene coho 11/13/2023 01:39 PM EDT RP Workstation: [id number]        Scheduled Meds:  aspirin   81 mg Oral Daily   Chlorhexidine  Gluconate Cloth  6 each Topical Daily   feeding supplement (NEPRO CARB STEADY)  237 mL Oral BID BM   sodium chloride  flush  10-40 mL Intracatheter Q12H   sodium chloride  flush  3 mL Intravenous Q12H   torsemide   20 mg Oral Daily   Continuous Infusions:  sodium chloride  250 mL (11/13/23 1815)   sodium chloride        LOS: 0 days    Time spent: 35 Minutes.     Owen DELENA Lore, MD [facility]   If 7PM-7AM, please contact night-coverage [email or web address]  11/14/2023, 2:06 PM

## 2023-11-15 ENCOUNTER — Other Ambulatory Visit: Payer: Self-pay | Admitting: Radiology

## 2023-11-15 ENCOUNTER — Telehealth: Payer: Self-pay | Admitting: Radiology

## 2023-11-15 ENCOUNTER — Other Ambulatory Visit: Payer: Self-pay

## 2023-11-15 ENCOUNTER — Ambulatory Visit
Admit: 2023-11-15 | Discharge: 2023-11-15 | Disposition: A | Discharge status: Home / Self Care | Attending: Radiation Oncology | Admitting: Radiation Oncology

## 2023-11-15 DIAGNOSIS — D630 Anemia in neoplastic disease: Secondary | ICD-10-CM

## 2023-11-15 DIAGNOSIS — D649 Anemia, unspecified: Secondary | ICD-10-CM | POA: Diagnosis not present

## 2023-11-15 DIAGNOSIS — I083 Combined rheumatic disorders of mitral, aortic and tricuspid valves: Secondary | ICD-10-CM

## 2023-11-15 DIAGNOSIS — D61818 Other pancytopenia: Secondary | ICD-10-CM

## 2023-11-15 DIAGNOSIS — N179 Acute kidney failure, unspecified: Secondary | ICD-10-CM

## 2023-11-15 DIAGNOSIS — I700 Atherosclerosis of aorta: Secondary | ICD-10-CM

## 2023-11-15 DIAGNOSIS — Z7982 Long term (current) use of aspirin: Secondary | ICD-10-CM

## 2023-11-15 DIAGNOSIS — I130 Hypertensive heart and chronic kidney disease with heart failure and stage 1 through stage 4 chronic kidney disease, or unspecified chronic kidney disease: Secondary | ICD-10-CM | POA: Diagnosis not present

## 2023-11-15 DIAGNOSIS — N1832 Chronic kidney disease, stage 3b: Secondary | ICD-10-CM | POA: Diagnosis not present

## 2023-11-15 DIAGNOSIS — I5021 Acute systolic (congestive) heart failure: Secondary | ICD-10-CM | POA: Diagnosis not present

## 2023-11-15 DIAGNOSIS — Z7984 Long term (current) use of oral hypoglycemic drugs: Secondary | ICD-10-CM

## 2023-11-15 DIAGNOSIS — C8513 Unspecified B-cell lymphoma, intra-abdominal lymph nodes: Secondary | ICD-10-CM

## 2023-11-15 DIAGNOSIS — E440 Moderate protein-calorie malnutrition: Secondary | ICD-10-CM

## 2023-11-15 DIAGNOSIS — C8338 Diffuse large B-cell lymphoma, lymph nodes of multiple sites: Secondary | ICD-10-CM | POA: Diagnosis not present

## 2023-11-15 DIAGNOSIS — B1910 Unspecified viral hepatitis B without hepatic coma: Secondary | ICD-10-CM

## 2023-11-15 LAB — BASIC METABOLIC PANEL WITH GFR
Anion gap: 13 (ref 5–15)
BUN: 47 mg/dL — ABNORMAL HIGH (ref 8–23)
CO2: 24 mmol/L (ref 22–32)
Calcium: 9.6 mg/dL (ref 8.9–10.3)
Chloride: 98 mmol/L (ref 98–111)
Creatinine, Ser: 2.3 mg/dL — ABNORMAL HIGH (ref 0.44–1.00)
GFR, Estimated: 21 mL/min — ABNORMAL LOW (ref >60)
Glucose, Bld: 98 mg/dL (ref 70–99)
Potassium: 3.1 mmol/L — ABNORMAL LOW (ref 3.5–5.1)
Sodium: 135 mmol/L (ref 135–145)

## 2023-11-15 LAB — RAD ONC ARIA SESSION SUMMARY
Course Elapsed Days: 0
Plan Fractions Treated to Date: 1
Plan Prescribed Dose Per Fraction: 3.5 Gy
Plan Total Fractions Prescribed: 5
Plan Total Prescribed Dose: 17.5 Gy
Reference Point Dosage Given to Date: 3.5 Gy
Reference Point Session Dosage Given: 3.5 Gy
Session Number: 1

## 2023-11-15 LAB — SPECIMEN STATUS REPORT

## 2023-11-15 LAB — CBC
HCT: 29.5 % — ABNORMAL LOW (ref 36.0–46.0)
Hemoglobin: 9.3 g/dL — ABNORMAL LOW (ref 12.0–15.0)
MCH: 27.6 pg (ref 26.0–34.0)
MCHC: 31.5 g/dL (ref 30.0–36.0)
MCV: 87.5 fL (ref 80.0–100.0)
Platelets: 236 K/uL (ref 150–400)
RBC: 3.37 MIL/uL — ABNORMAL LOW (ref 3.87–5.11)
RDW: 14.5 % (ref 11.5–15.5)
WBC: 12.3 K/uL — ABNORMAL HIGH (ref 4.0–10.5)
nRBC: 0 % (ref 0.0–0.2)

## 2023-11-15 MED ORDER — POTASSIUM CHLORIDE CRYS ER 20 MEQ PO TBCR
40.0000 meq | EXTENDED_RELEASE_TABLET | ORAL | Status: AC
  Administered 2023-11-15 (×4): 40 meq via ORAL
  Filled 2023-11-15: qty 2

## 2023-11-15 MED ORDER — HEPARIN SOD (PORK) LOCK FLUSH 100 UNIT/ML IV SOLN
500.0000 [IU] | Freq: Once | INTRAVENOUS | Status: AC
  Administered 2023-11-15 (×2): 500 [IU] via INTRAVENOUS

## 2023-11-15 MED ORDER — TORSEMIDE 20 MG PO TABS: 20.0000 mg | ORAL_TABLET | Freq: Every day | ORAL | 1 refills | Status: DC

## 2023-11-15 MED ORDER — POTASSIUM CHLORIDE CRYS ER 20 MEQ PO TBCR: 20.0000 meq | EXTENDED_RELEASE_TABLET | Freq: Every day | ORAL | 0 refills | Status: DC

## 2023-11-15 NOTE — Plan of Care (Signed)
 Patient is alert and oriented X4, son at the bedside. Patient discharge information, medications, and future appointments reviewed. All questions answered, chest port deaccessed, no further needs atm. Son to transport patient home.   Problem: Education: Goal: Knowledge of General Education information will improve Description: Including pain rating scale, medication(s)/side effects and non-pharmacologic comfort measures Outcome: Adequate for Discharge   Problem: Health Behavior/Discharge Planning: Goal: Ability to manage health-related needs will improve Outcome: Adequate for Discharge   Problem: Clinical Measurements: Goal: Ability to maintain clinical measurements within normal limits will improve Outcome: Adequate for Discharge Goal: Will remain free from infection Outcome: Adequate for Discharge Goal: Diagnostic test results will improve Outcome: Adequate for Discharge Goal: Respiratory complications will improve Outcome: Adequate for Discharge Goal: Cardiovascular complication will be avoided Outcome: Adequate for Discharge   Problem: Activity: Goal: Risk for activity intolerance will decrease Outcome: Adequate for Discharge   Problem: Nutrition: Goal: Adequate nutrition will be maintained Outcome: Adequate for Discharge   Problem: Coping: Goal: Level of anxiety will decrease Outcome: Adequate for Discharge   Problem: Elimination: Goal: Will not experience complications related to bowel motility Outcome: Adequate for Discharge Goal: Will not experience complications related to urinary retention Outcome: Adequate for Discharge   Problem: Pain Managment: Goal: General experience of comfort will improve and/or be controlled Outcome: Adequate for Discharge   Problem: Safety: Goal: Ability to remain free from injury will improve Outcome: Adequate for Discharge   Problem: Skin Integrity: Goal: Risk for impaired skin integrity will decrease Outcome: Adequate for  Discharge

## 2023-11-15 NOTE — Discharge Summary (Signed)
 [facility]  Physician Discharge Summary   Patient ID: Jacqueline Hancock MRN: [id number] DOB/AGE: Mar 13, 1941 83 y.o.  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 Onesimo oncologist presented for follow-up and was found to have a hemoglobin of 6.6, worsening lower extremity edema and persistent abdominal pain right side at the site of her known abdominal mass.  Dr. Onesimo referred patient for admission for 2 units of packed red blood cell, IV Lasix  in between, evaluation for Autoimmune hemolytic anemia, palliative care consultation.     HOSPITAL COURSE:    1-Anemia, Symptomatic -Received  2 units PRBC. Received lasix  between transfusion.  - Evaluation for hemolytic anemia: Direct antiglobulin test negative, LDH 400, haptoglobin 363 not low. Retic count 1.5. Globin responded appropriately.  No evidence of overt bleeding.   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 - Dr. Onesimo recommended palliative care consultation for goals of care, he discussed with patient supportive care through home with hospice and consideration for palliative radiation if is offered by radiation oncology. Radiation oncology offered radiation treatments which will be initiated today. Palliative care has seen the patient. Patient is DNR.   3-CKD stage IV:  Patient with CKD stage IV previous creatinine 1.7---2.0 Creatinine range has been increasing to 2.5 over the last 2 weeks Renal US  showed large mass right kidney. Discussed with Urology, no urologic intervention can be offer for this finding.  Renal function is stable today.   4-Chronic systolic heart failure ejection fraction 30% - Patient denies shortness of breath, she does have lower extremity edema, that could be related to venous insufficiency as well.  BNP not significantly elevated at 169.  Chest x-ray no significant pulmonary edema Symptoms improved with resumption of torsemide .   5-Bilateral lower extremity edema: Doppler: Negative for DVT TED hose order   Hypokalemia: Supplemented prior to discharge     History of chronic active hepatitis B Follow-up as an outpatient   Patient is stable.  Discussed with her son.  Okay for discharge this afternoon after radiation treatment.  PERTINENT LABS:  The results of significant diagnostics from this hospitalization (including imaging, microbiology, ancillary and laboratory) are listed below for reference.      Labs:   Basic Metabolic Panel: Recent Labs  Lab 11/10/23 1107 11/13/23 1020 11/14/23 1009 11/15/23 0500  NA 135 135 134* 135  K 3.3* 3.0* 3.0* 3.1*  CL 97* 96* 98 98  CO2 23 26 22 24   GLUCOSE 85 103* 140* 98  BUN 39* 42* 47* 47*  CREATININE 2.51* 2.48* 2.41* 2.30*  CALCIUM 9.2 9.3 9.1 9.6  Liver Function Tests: Recent Labs  Lab 11/10/23 1107 11/13/23 1020 11/14/23 1009  AST 12 11* 12*  ALT 11 8 9    ALKPHOS  --  75 74  BILITOT 0.9 1.0 1.0  PROT 6.0* 6.4* 6.4*  ALBUMIN   --  3.6 2.8*    CBC: Recent Labs  Lab 11/10/23 1107 11/13/23 1020 11/14/23 1009 11/15/23 0500  WBC 10.2 10.2 12.0* 12.3*  NEUTROABS 8,191* 8.6*  --   --   HGB 6.9* 6.6* 11.2* 9.3*  HCT 21.7* 20.1* 33.8* 29.5*  MCV 87.1 84.5 86.0 87.5  PLT 295 280 287 236    BNP: BNP (last 3 results) Recent Labs    08/09/23 1759 10/06/23 0416 11/13/23 1237  BNP 38.2 1,289.3* 169.9*    IMAGING STUDIES VAS US  LOWER EXTREMITY VENOUS (DVT) Result Date: 11/14/2023  Lower Venous DVT Study Patient Name:  LAKODA RASKE  Date of Exam:   11/14/2023 Medical Rec #: [id number]    Accession #:    [id number] Date of Birth: 04-22-1940    Patient Gender: F Patient Age:   71 years Exam Location:  [facility] Procedure:      VAS US  LOWER EXTREMITY VENOUS (DVT) Referring Phys: OWEN REGALADO --------------------------------------------------------------------------------  Indications: Edema.  Performing Technologist: Vernon Lindau, RVT  Examination Guidelines: A complete evaluation includes B-mode imaging, spectral Doppler, color Doppler, and power Doppler as needed of all accessible portions of each vessel. Bilateral testing is considered an integral part of a complete examination. Limited examinations for reoccurring indications may be performed as noted. The reflux portion of the exam is performed with the patient in reverse Trendelenburg.  +---------+---------------+---------+-----------+----------+--------------+ RIGHT    CompressibilityPhasicitySpontaneityPropertiesThrombus Aging +---------+---------------+---------+-----------+----------+--------------+ CFV      Full           Yes      Yes                                 +---------+---------------+---------+-----------+----------+--------------+ SFJ      Full                                                         +---------+---------------+---------+-----------+----------+--------------+ FV Prox  Full                                                        +---------+---------------+---------+-----------+----------+--------------+ FV Mid   Full                                                        +---------+---------------+---------+-----------+----------+--------------+ FV DistalFull                                                        +---------+---------------+---------+-----------+----------+--------------+ PFV      Full                                                        +---------+---------------+---------+-----------+----------+--------------+  POP      Full           Yes      Yes                                 +---------+---------------+---------+-----------+----------+--------------+ PTV      Full                                                        +---------+---------------+---------+-----------+----------+--------------+ PERO     Full                                                        +---------+---------------+---------+-----------+----------+--------------+   +---------+---------------+---------+-----------+----------+--------------+ LEFT     CompressibilityPhasicitySpontaneityPropertiesThrombus Aging +---------+---------------+---------+-----------+----------+--------------+ CFV      Full           Yes      Yes                                 +---------+---------------+---------+-----------+----------+--------------+ SFJ      Full                                                        +---------+---------------+---------+-----------+----------+--------------+ FV Prox  Full                                                        +---------+---------------+---------+-----------+----------+--------------+ FV Mid   Full                                                         +---------+---------------+---------+-----------+----------+--------------+ FV DistalFull                                                        +---------+---------------+---------+-----------+----------+--------------+ PFV      Full                                                        +---------+---------------+---------+-----------+----------+--------------+ POP      Full           Yes      Yes                                 +---------+---------------+---------+-----------+----------+--------------+  PTV      Full                                                        +---------+---------------+---------+-----------+----------+--------------+ PERO     Full                                                        +---------+---------------+---------+-----------+----------+--------------+     Summary: RIGHT: - There is no evidence of deep vein thrombosis in the lower extremity.  - No cystic structure found in the popliteal fossa.  LEFT: - There is no evidence of deep vein thrombosis in the lower extremity.  - No cystic structure found in the popliteal fossa.  *See table(s) above for measurements and observations. Electronically signed by Penne Colorado MD on 11/14/2023 at 10:06:23 PM.    Final    US  RENAL Result Date: 11/13/2023 CLINICAL DATA:  Acute kidney injury.  Diffuse large B-cell lymphoma. EXAM: RENAL / URINARY TRACT ULTRASOUND COMPLETE COMPARISON:  PET CT 10/19/2023. CT abdomen and pelvis 10/06/2023. FINDINGS: Right Kidney: Heterogeneous mass is seen in the region of the right kidney measuring 20.3 x 10.1 x 16.1 cm. Normal renal parenchyma is unable to be differentiated from this mass. Overall dimensions are similar to prior CT given differences in technique. Left Kidney: Renal measurements: 12.2 x 5.0 x 4.8 cm = volume: 152 mL. Echogenicity within normal limits. There is mild hydronephrosis. No focal lesion identified. Bladder: Appears normal for degree of bladder  distention. Other: None. IMPRESSION: 1. Grossly unchanged heterogeneous mass in the region of the right kidney measuring 20.3 x 10.1 x 16.1 cm. Normal renal parenchyma is unable to be differentiated from this mass. 2. Mild left hydronephrosis. Electronically Signed   By: Greig Pique M.D.   On: 11/13/2023 16:38   DG Chest Portable 1 View Result Date: 11/13/2023 EXAM: 1 VIEW XRAY OF THE CHEST 11/13/2023 12:22:57 PM COMPARISON: AP radiograph of the chest dated 10/06/2023. CLINICAL HISTORY: CHF. Per chart: Pt presents from [facility] for HGB of 6.6. H/x non hodgkins lymphoma, HF, Hep B. FINDINGS: LUNGS AND PLEURA: The hazy pulmonary opacities noted on the previous study have resolved in the interim. No pleural effusion. No pneumothorax. HEART AND MEDIASTINUM: The heart is borderline in size. No acute abnormality of the mediastinal silhouette. BONES AND SOFT TISSUES: No acute osseous abnormality. An endotracheal tube and gastric tube have been removed. A right internal jugular chest port remains in place. IMPRESSION: 1. Resolution of hazy pulmonary opacities noted on the previous study. 2. Borderline heart size. Electronically signed by: evalene coho 11/13/2023 01:39 PM EDT RP Workstation: [id number]   NM PET Image Restag (PS) Skull Base To Thigh Result Date: 10/25/2023 CLINICAL DATA:  Subsequent treatment strategy for diffuse large B-cell lymphoma. EXAM: NUCLEAR MEDICINE PET SKULL BASE TO THIGH TECHNIQUE: 6.3 mCi F-18 FDG was injected intravenously. Full-ring PET imaging was performed from the skull base to thigh after the radiotracer. CT data was obtained and used for attenuation correction and anatomic localization. Fasting blood glucose: 88 mg/dl COMPARISON:  5/85/7974 FINDINGS: Mediastinal blood pool activity: SUV max 2.2 Liver activity: SUV max 3.3 NECK: No significant  abnormal hypermetabolic activity in this region. Incidental CT findings: None. CHEST: No significant abnormal hypermetabolic activity in  this region. Incidental CT findings: Right Port-A-Cath tip: Lower right atrium. Mild cardiomegaly. Trace pericardial effusion. Atheromatous vascular calcification in the thoracic aorta. 4 mm left lower lobe nodule on image 65 series 4 previously shown to be calcified hence likely benign. 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 Anterior components of this process has a maximum SUV of 21.8 (Deauville 5), previously 27.0. As before, this process fills the perirenal space extending into the retroperitoneum root of the mesentery region and tracking down towards the right pelvic sidewall. The degree of distal periureteral high activity is reduced. Newly hypermetabolic right pelvic sidewall lymph node measures 0.7 cm in short axis on image 146 series 4, maximum SUV 9.2 (Deauville 5). This previously measured 0.6 cm on the PET-CT from 07/17/2023, with maximum SUV 1.5. Right inguinal node measuring 0.7 cm in short axis on image 151 series 4 with maximum SUV 5.1 (Deauville 4), previously 0.7 cm in diameter with maximum SUV 2.4. Focal abnormal hypermetabolic activity along the anterior dome of the right hepatic lobe capsular margin, maximum SUV 8.5 (Deauville 5). The associated hypermetabolic activity measures approximately 1.3 cm in diameter. This could be along the diaphragm or hepatic capsule rather than necessarily being intrahepatic. Incidental CT findings: Atherosclerosis is present, including aortoiliac atherosclerotic disease. Mild ascites. Prominent stool throughout the colon favors constipation. Diffuse subcutaneous edema. SKELETON: No significant abnormal hypermetabolic activity in this region. Incidental CT findings: None. IMPRESSION: 1. Large right perinephric, periureteral, and retroperitoneal mass with Deauville 5 activity again observed with severe associated distortion of the right kidney. The degree of  distal periureteral high activity is reduced. 2. Newly hypermetabolic right pelvic sidewall lymph node and right inguinal lymph node, both Deauville 5. 3. Focal abnormal hypermetabolic activity along the anterior dome of the right hepatic lobe capsular margin, Deauville 5. 4. Mild ascites. Diffuse subcutaneous edema. 5. Prominent stool throughout the colon favors constipation. 6.  Aortic Atherosclerosis (ICD10-I70.0). Electronically Signed   By: Ryan Salvage M.D.   On: 10/25/2023 06:57    DISCHARGE EXAMINATION: Vitals:   11/14/23 0520 11/14/23 1406 11/14/23 2047 11/15/23 0530  BP: 138/78 139/69 (!) 142/69 (!) 144/76  Pulse: 72 83 88 85  Resp: 18 18 14 14   Temp: (!) 97.4 F (36.3 C) 97.6 F (36.4 C) 98.9 F (37.2 C) 98.6 F (37 C)  TempSrc: Oral Oral Oral Oral  SpO2: 100% 100% 100% 100%  Weight:      Height:       General appearance: Awake alert.  In no distress Resp: Clear to auscultation bilaterally.  Normal effort Cardio: S1-S2 is normal regular.  No S3-S4.  No rubs murmurs or bruit GI: Abdomen is soft.  Nontender nondistended.  Bowel sounds are present normal.  No masses organomegaly Extremities: No edema.  Full range of motion of lower extremities. Neurologic: Alert and oriented x3.  No focal neurological deficits.    DISPOSITION: Home  Discharge Instructions     Call MD for:  difficulty breathing, headache or visual disturbances   Complete by: As directed    Call MD for:  extreme fatigue   Complete by: As directed    Call MD for:  persistant dizziness or light-headedness   Complete by: As directed    Call MD for:  persistant nausea and vomiting   Complete by: As directed  Call MD for:  severe uncontrolled pain   Complete by: As directed    Call MD for:  temperature >100.4   Complete by: As directed    Diet - low sodium heart healthy   Complete by: As directed    Discharge instructions   Complete by: As directed    Please take your medications as prescribed.   Please be sure to keep up with your radiation appointments.  You were cared for by a hospitalist during your hospital stay. If you have any questions about your discharge medications or the care you received while you were in the hospital after you are discharged, you can call the unit and asked to speak with the hospitalist on call if the hospitalist that took care of you is not available. Once you are discharged, your primary care physician will handle any further medical issues. Please note that NO REFILLS for any discharge medications will be authorized once you are discharged, as it is imperative that you return to your primary care physician (or establish a relationship with a primary care physician if you do not have one) for your aftercare needs so that they can reassess your need for medications and monitor your lab values. If you do not have a primary care physician, you can call [phone] for a physician referral.   Increase activity slowly   Complete by: As directed          Allergies as of 11/15/2023       Reactions   Peanut (diagnostic) Itching   Peanut Butter.   Nexium [esomeprazole Magnesium ] Other (See Comments)   Headache        Medication List     TAKE these medications    Aspirin  Low Dose 81 MG chewable tablet Generic drug: aspirin  Chew 1 tablet (81 mg total) by mouth daily.   entecavir  0.5 MG tablet Commonly known as: BARACLUDE  Take 1 tablet (0.5 mg total) by mouth every 3 (three) days.   Jardiance  10 MG Tabs tablet Generic drug: empagliflozin  Take 1 tablet (10 mg total) by mouth daily before breakfast.   ondansetron  8 MG tablet Commonly known as: ZOFRAN  Take 8 mg by mouth every 8 (eight) hours as needed for nausea or vomiting.   pantoprazole  40 MG tablet Commonly known as: PROTONIX  Take 1 tablet (40 mg total) by mouth 2 (two) times daily for 56 days, THEN 1 tablet (40 mg total) daily. Start taking on: September 05, 2023 What changed: See the new  instructions.   polyethylene glycol 17 g packet Commonly known as: MIRALAX  / GLYCOLAX  Take 17 g by mouth daily as needed for mild constipation or moderate constipation. Also available OTC   potassium chloride  SA 20 MEQ tablet Commonly known as: KLOR-CON  M Take 1 tablet (20 mEq total) by mouth daily for 5 days.   torsemide  20 MG tablet Commonly known as: DEMADEX  Take 1 tablet (20 mg total) by mouth daily. What changed: how much to take   traMADol  50 MG tablet Commonly known as: ULTRAM  Take 1 tablet (50 mg total) by mouth every 8 (eight) hours as needed for moderate pain (pain score 4-6) or severe pain (pain score 7-10) (pain).          Follow-up Information     Ngetich, Dinah C, NP. Schedule an appointment as soon as possible for a visit.   Specialty: Family Medicine Why: post hospitalization follow up Contact information: [address] [place] [address] [phone]  Onesimo Emaline Brink, MD. Schedule an appointment as soon as possible for a visit.   Specialties: Hematology, Oncology Why: post hospitalization follow up Contact information: [address] [place] [address] [phone]                 TOTAL DISCHARGE TIME: 35 minutes  Milady Fleener Verdene  [facility] Pager on [email or web address]  11/15/2023, 12:22 PM

## 2023-11-15 NOTE — Telephone Encounter (Signed)
 INFORMED Jacqueline Hancock OF HIS MOTHER'S LABS SCHEDULED FOR 11-17-23 @ 4:15 PM AND 11-21-23 @ 4 PM, PRIOR TO HER TX'S. HE VOICED UNDERSTANDING.

## 2023-11-16 ENCOUNTER — Other Ambulatory Visit: Payer: Self-pay

## 2023-11-16 ENCOUNTER — Inpatient Hospital Stay
Admission: RE | Admit: 2023-11-16 | Discharge: 2023-11-16 | Disposition: A | Discharge status: Home / Self Care | Payer: Self-pay | Source: Ambulatory Visit | Attending: Radiation Oncology | Admitting: Radiation Oncology

## 2023-11-16 LAB — RAD ONC ARIA SESSION SUMMARY
Course Elapsed Days: 1
Plan Fractions Treated to Date: 2
Plan Prescribed Dose Per Fraction: 3.5 Gy
Plan Total Fractions Prescribed: 5
Plan Total Prescribed Dose: 17.5 Gy
Reference Point Dosage Given to Date: 7 Gy
Reference Point Session Dosage Given: 3.5 Gy
Session Number: 2

## 2023-11-16 LAB — C3 AND C4
Complement C3, Serum: 169 mg/dL — ABNORMAL HIGH (ref 82–167)
Complement C4, Serum: 43 mg/dL — ABNORMAL HIGH (ref 12–38)

## 2023-11-16 LAB — SEDIMENTATION RATE: Sed Rate: 36 mm/h (ref 0–40)

## 2023-11-16 LAB — C-REACTIVE PROTEIN: CRP: 192 mg/L — ABNORMAL HIGH (ref 0–10)

## 2023-11-16 LAB — SPECIMEN STATUS REPORT

## 2023-11-16 LAB — ANTI-DNA ANTIBODY, DOUBLE-STRANDED: dsDNA Ab: 2 [IU]/mL (ref 0–9)

## 2023-11-17 ENCOUNTER — Inpatient Hospital Stay
Admission: RE | Admit: 2023-11-17 | Discharge: 2023-11-17 | Disposition: A | Discharge status: Home / Self Care | Payer: Self-pay | Source: Ambulatory Visit | Attending: Radiation Oncology | Admitting: Radiation Oncology

## 2023-11-17 ENCOUNTER — Ambulatory Visit
Admit: 2023-11-17 | Discharge: 2023-11-17 | Disposition: A | Discharge status: Home / Self Care | Attending: Radiology | Admitting: Radiology

## 2023-11-17 ENCOUNTER — Other Ambulatory Visit: Payer: Self-pay

## 2023-11-17 DIAGNOSIS — C8513 Unspecified B-cell lymphoma, intra-abdominal lymph nodes: Secondary | ICD-10-CM

## 2023-11-17 LAB — BASIC METABOLIC PANEL - CANCER CENTER ONLY
Anion gap: 15 (ref 5–15)
BUN: 52 mg/dL — ABNORMAL HIGH (ref 8–23)
CO2: 22 mmol/L (ref 22–32)
Calcium: 10 mg/dL (ref 8.9–10.3)
Chloride: 99 mmol/L (ref 98–111)
Creatinine: 2.2 mg/dL — ABNORMAL HIGH (ref 0.44–1.00)
GFR, Estimated: 22 mL/min — ABNORMAL LOW (ref >60)
Glucose, Bld: 102 mg/dL — ABNORMAL HIGH (ref 70–99)
Potassium: 4 mmol/L (ref 3.5–5.1)
Sodium: 136 mmol/L (ref 135–145)

## 2023-11-17 LAB — RAD ONC ARIA SESSION SUMMARY
Course Elapsed Days: 2
Plan Fractions Treated to Date: 3
Plan Prescribed Dose Per Fraction: 3.5 Gy
Plan Total Fractions Prescribed: 5
Plan Total Prescribed Dose: 17.5 Gy
Reference Point Dosage Given to Date: 10.5 Gy
Reference Point Session Dosage Given: 3.5 Gy
Session Number: 3

## 2023-11-17 LAB — PHOSPHORUS: Phosphorus: 4.3 mg/dL (ref 2.5–4.6)

## 2023-11-17 LAB — URIC ACID: Uric Acid, Serum: 11.4 mg/dL — ABNORMAL HIGH (ref 2.5–7.1)

## 2023-11-20 ENCOUNTER — Other Ambulatory Visit: Payer: Self-pay

## 2023-11-20 ENCOUNTER — Inpatient Hospital Stay
Admission: RE | Admit: 2023-11-20 | Discharge: 2023-11-20 | Disposition: A | Discharge status: Home / Self Care | Payer: Self-pay | Source: Ambulatory Visit | Attending: Radiation Oncology | Admitting: Radiation Oncology

## 2023-11-20 ENCOUNTER — Other Ambulatory Visit: Payer: Self-pay | Admitting: Family

## 2023-11-20 ENCOUNTER — Ambulatory Visit
Admission: RE | Admit: 2023-11-20 | Discharge: 2023-11-20 | Discharge status: Home / Self Care | Source: Ambulatory Visit | Attending: Radiation Oncology

## 2023-11-20 DIAGNOSIS — N183 Chronic kidney disease, stage 3 unspecified: Secondary | ICD-10-CM

## 2023-11-20 LAB — RAD ONC ARIA SESSION SUMMARY
Course Elapsed Days: 5
Plan Fractions Treated to Date: 4
Plan Prescribed Dose Per Fraction: 3.5 Gy
Plan Total Fractions Prescribed: 5
Plan Total Prescribed Dose: 17.5 Gy
Reference Point Dosage Given to Date: 14 Gy
Reference Point Session Dosage Given: 3.5 Gy
Session Number: 4

## 2023-11-20 NOTE — Telephone Encounter (Signed)
 Patient has request refill on medication that hasn't been refilled by PCP Ngetich, Dinah C, NP yet. Medication pend and sent to PCP Ngetich, Dinah C, NP for approval.

## 2023-11-21 ENCOUNTER — Ambulatory Visit
Admit: 2023-11-21 | Discharge: 2023-11-21 | Disposition: A | Discharge status: Home / Self Care | Attending: Radiology | Admitting: Radiology

## 2023-11-21 ENCOUNTER — Telehealth: Payer: Self-pay | Admitting: Radiation Oncology

## 2023-11-21 ENCOUNTER — Other Ambulatory Visit: Payer: Self-pay

## 2023-11-21 ENCOUNTER — Inpatient Hospital Stay
Admission: RE | Admit: 2023-11-21 | Discharge: 2023-11-21 | Disposition: A | Discharge status: Home / Self Care | Payer: Self-pay | Source: Ambulatory Visit | Attending: Radiation Oncology | Admitting: Radiation Oncology

## 2023-11-21 DIAGNOSIS — C8513 Unspecified B-cell lymphoma, intra-abdominal lymph nodes: Secondary | ICD-10-CM

## 2023-11-21 DIAGNOSIS — C8338 Diffuse large B-cell lymphoma, lymph nodes of multiple sites: Secondary | ICD-10-CM

## 2023-11-21 LAB — CBC WITH DIFFERENTIAL (CANCER CENTER ONLY)
Abs Immature Granulocytes: 0.06 K/uL (ref 0.00–0.07)
Basophils Absolute: 0 K/uL (ref 0.0–0.1)
Basophils Relative: 0 %
Eosinophils Absolute: 0 K/uL (ref 0.0–0.5)
Eosinophils Relative: 0 %
HCT: 29.7 % — ABNORMAL LOW (ref 36.0–46.0)
Hemoglobin: 9.6 g/dL — ABNORMAL LOW (ref 12.0–15.0)
Immature Granulocytes: 1 %
Lymphocytes Relative: 3 %
Lymphs Abs: 0.2 K/uL — ABNORMAL LOW (ref 0.7–4.0)
MCH: 27.8 pg (ref 26.0–34.0)
MCHC: 32.3 g/dL (ref 30.0–36.0)
MCV: 86.1 fL (ref 80.0–100.0)
Monocytes Absolute: 0.4 K/uL (ref 0.1–1.0)
Monocytes Relative: 5 %
Neutro Abs: 7.3 K/uL (ref 1.7–7.7)
Neutrophils Relative %: 91 %
Platelet Count: 211 K/uL (ref 150–400)
RBC: 3.45 MIL/uL — ABNORMAL LOW (ref 3.87–5.11)
RDW: 14.5 % (ref 11.5–15.5)
WBC Count: 8 K/uL (ref 4.0–10.5)
nRBC: 0 % (ref 0.0–0.2)

## 2023-11-21 LAB — BASIC METABOLIC PANEL - CANCER CENTER ONLY
Anion gap: 15 (ref 5–15)
BUN: 60 mg/dL — ABNORMAL HIGH (ref 8–23)
CO2: 23 mmol/L (ref 22–32)
Calcium: 10 mg/dL (ref 8.9–10.3)
Chloride: 99 mmol/L (ref 98–111)
Creatinine: 2.33 mg/dL — ABNORMAL HIGH (ref 0.44–1.00)
GFR, Estimated: 20 mL/min — ABNORMAL LOW (ref >60)
Glucose, Bld: 110 mg/dL — ABNORMAL HIGH (ref 70–99)
Potassium: 3.5 mmol/L (ref 3.5–5.1)
Sodium: 137 mmol/L (ref 135–145)

## 2023-11-21 LAB — RAD ONC ARIA SESSION SUMMARY
Course Elapsed Days: 6
Plan Fractions Treated to Date: 5
Plan Prescribed Dose Per Fraction: 3.5 Gy
Plan Total Fractions Prescribed: 5
Plan Total Prescribed Dose: 17.5 Gy
Reference Point Dosage Given to Date: 17.5 Gy
Reference Point Session Dosage Given: 3.5 Gy
Session Number: 5

## 2023-11-21 NOTE — Telephone Encounter (Signed)
 Called pt to schedule f/u, spoke to son who was agreeable to 9/16@1 :40pm.

## 2023-11-22 NOTE — Radiation Completion Notes (Signed)
 Patient Name: Jacqueline Hancock, Jacqueline Hancock MRN: [id number] Date of Birth: [birth year] Referring Physician: ROXAN PLOUGH, M.D. Date of Service: 2023-11-22 Radiation Oncologist: Lauraine Golden, M.D.  [facility]                             RADIATION ONCOLOGY END OF TREATMENT NOTE     Diagnosis: C85.13 Unspecified B-cell lymphoma, intra-abdominal lymph nodes Intent: Palliative     ==========DELIVERED PLANS==========  First Treatment Date: 2023-11-15 Last Treatment Date: 2023-11-21   Plan Name: Abd Site: Abdomen Technique: 3D Mode: Photon Dose Per Fraction: 3.5 Gy Prescribed Dose (Delivered / Prescribed): 17.5 Gy / 17.5 Gy Prescribed Fxs (Delivered / Prescribed): 5 / 5     ==========ON TREATMENT VISIT DATES========== 2023-11-20     ==========UPCOMING VISITS========== 01/08/2024 [facility]R FOR INF DIS OFFICE VISIT Luiz Channel, MD  12/19/2023 [facility]-RADIATION ONC FOLLOW UP 20 Wyatt Leeroy HERO, DEVONNA  12/13/2023 [facility] NEW CHF Rolan Ezra RAMAN, MD  12/13/2023 [facility]-ECHO LAB ECHOCARDIOGRAM [facility] ECHO OP 1        ==========APPENDIX - ON TREATMENT VISIT NOTES==========   See weekly On Treatment Notes in Epic for details in the Media tab (listed as Progress notes on the On Treatment Visit Dates listed above).

## 2023-11-28 ENCOUNTER — Other Ambulatory Visit: Payer: Self-pay

## 2023-11-28 DIAGNOSIS — C8338 Diffuse large B-cell lymphoma, lymph nodes of multiple sites: Secondary | ICD-10-CM

## 2023-11-29 ENCOUNTER — Encounter: Payer: Self-pay | Admitting: Hematology

## 2023-11-29 ENCOUNTER — Inpatient Hospital Stay

## 2023-11-29 ENCOUNTER — Inpatient Hospital Stay: Admitting: Hematology

## 2023-11-29 VITALS — BP 152/68 | HR 75 | Temp 97.3°F | Resp 18 | Wt 111.2 lb

## 2023-11-29 DIAGNOSIS — E876 Hypokalemia: Secondary | ICD-10-CM | POA: Diagnosis not present

## 2023-11-29 DIAGNOSIS — C8338 Diffuse large B-cell lymphoma, lymph nodes of multiple sites: Secondary | ICD-10-CM

## 2023-11-29 LAB — CBC WITH DIFFERENTIAL (CANCER CENTER ONLY)
Abs Immature Granulocytes: 0.01 K/uL (ref 0.00–0.07)
Basophils Absolute: 0 K/uL (ref 0.0–0.1)
Basophils Relative: 1 %
Eosinophils Absolute: 0.1 K/uL (ref 0.0–0.5)
Eosinophils Relative: 3 %
HCT: 26.5 % — ABNORMAL LOW (ref 36.0–46.0)
Hemoglobin: 8.7 g/dL — ABNORMAL LOW (ref 12.0–15.0)
Immature Granulocytes: 0 %
Lymphocytes Relative: 10 %
Lymphs Abs: 0.3 K/uL — ABNORMAL LOW (ref 0.7–4.0)
MCH: 28.1 pg (ref 26.0–34.0)
MCHC: 32.8 g/dL (ref 30.0–36.0)
MCV: 85.5 fL (ref 80.0–100.0)
Monocytes Absolute: 0.3 K/uL (ref 0.1–1.0)
Monocytes Relative: 9 %
Neutro Abs: 2.6 K/uL (ref 1.7–7.7)
Neutrophils Relative %: 77 %
Platelet Count: 173 K/uL (ref 150–400)
RBC: 3.1 MIL/uL — ABNORMAL LOW (ref 3.87–5.11)
RDW: 14.5 % (ref 11.5–15.5)
WBC Count: 3.4 K/uL — ABNORMAL LOW (ref 4.0–10.5)
nRBC: 0 % (ref 0.0–0.2)

## 2023-11-29 LAB — CMP (CANCER CENTER ONLY)
ALT: 10 U/L (ref 0–44)
AST: 11 U/L — ABNORMAL LOW (ref 15–41)
Albumin: 3.2 g/dL — ABNORMAL LOW (ref 3.5–5.0)
Alkaline Phosphatase: 287 U/L — ABNORMAL HIGH (ref 38–126)
Anion gap: 8 (ref 5–15)
BUN: 41 mg/dL — ABNORMAL HIGH (ref 8–23)
CO2: 28 mmol/L (ref 22–32)
Calcium: 9.2 mg/dL (ref 8.9–10.3)
Chloride: 102 mmol/L (ref 98–111)
Creatinine: 1.92 mg/dL — ABNORMAL HIGH (ref 0.44–1.00)
GFR, Estimated: 26 mL/min — ABNORMAL LOW (ref >60)
Glucose, Bld: 118 mg/dL — ABNORMAL HIGH (ref 70–99)
Potassium: 2.9 mmol/L — ABNORMAL LOW (ref 3.5–5.1)
Sodium: 138 mmol/L (ref 135–145)
Total Bilirubin: 0.9 mg/dL (ref 0.0–1.2)
Total Protein: 5.4 g/dL — ABNORMAL LOW (ref 6.5–8.1)

## 2023-11-29 LAB — SAMPLE TO BLOOD BANK

## 2023-11-29 MED ORDER — MAGNESIUM OXIDE -MG SUPPLEMENT 400 (240 MG) MG PO TABS: 400.0000 mg | ORAL_TABLET | Freq: Every day | ORAL | 0 refills | Status: AC

## 2023-11-29 MED ORDER — POTASSIUM CHLORIDE CRYS ER 20 MEQ PO TBCR: 20.0000 meq | EXTENDED_RELEASE_TABLET | Freq: Every day | ORAL | 0 refills | Status: AC

## 2023-11-29 MED ORDER — POTASSIUM CHLORIDE CRYS ER 20 MEQ PO TBCR
40.0000 meq | EXTENDED_RELEASE_TABLET | Freq: Once | ORAL | Status: AC
  Administered 2023-11-29: 40 meq via ORAL

## 2023-11-30 ENCOUNTER — Other Ambulatory Visit (HOSPITAL_COMMUNITY): Payer: Self-pay

## 2023-12-06 ENCOUNTER — Other Ambulatory Visit: Payer: Self-pay

## 2023-12-06 ENCOUNTER — Encounter: Payer: Self-pay | Admitting: Hematology

## 2023-12-06 ENCOUNTER — Other Ambulatory Visit (HOSPITAL_COMMUNITY): Payer: Self-pay

## 2023-12-06 MED ORDER — PROCHLORPERAZINE MALEATE 10 MG PO TABS
10.0000 mg | ORAL_TABLET | Freq: Four times a day (QID) | ORAL | 1 refills | Status: AC | PRN
  Filled 2023-12-06: qty 30, 8d supply, fill #0

## 2023-12-06 MED ORDER — ACYCLOVIR 400 MG PO TABS
400.0000 mg | ORAL_TABLET | Freq: Two times a day (BID) | ORAL | 5 refills | Status: DC
  Filled 2023-12-06: qty 60, 30d supply, fill #0
  Filled 2024-01-19 – 2024-02-05 (×3): qty 60, 30d supply, fill #1
  Filled 2024-03-04: qty 60, 30d supply, fill #2

## 2023-12-06 MED ORDER — DEXAMETHASONE 4 MG PO TABS
8.0000 mg | ORAL_TABLET | Freq: Every day | ORAL | 1 refills | Status: AC
  Filled 2023-12-06: qty 30, 15d supply, fill #0
  Filled 2024-01-19 – 2024-02-05 (×3): qty 30, 15d supply, fill #1

## 2023-12-06 MED ORDER — ONDANSETRON HCL 8 MG PO TABS
8.0000 mg | ORAL_TABLET | Freq: Three times a day (TID) | ORAL | 1 refills | Status: AC | PRN
  Filled 2023-12-06: qty 30, 10d supply, fill #0

## 2023-12-06 MED ORDER — LIDOCAINE-PRILOCAINE 2.5-2.5 % EX CREA
TOPICAL_CREAM | CUTANEOUS | 3 refills | Status: AC
  Filled 2023-12-06: qty 30, 30d supply, fill #0
  Filled 2024-01-19 – 2024-04-08 (×3): qty 30, 30d supply, fill #1

## 2023-12-06 MED ORDER — ALLOPURINOL 300 MG PO TABS
150.0000 mg | ORAL_TABLET | Freq: Every day | ORAL | 1 refills | Status: DC
  Filled 2023-12-06: qty 30, 60d supply, fill #0
  Filled 2024-01-30 – 2024-02-05 (×2): qty 30, 60d supply, fill #1

## 2023-12-06 NOTE — Progress Notes (Signed)
 HEMATOLOGY/ONCOLOGY CLINIC NOTE  Date of Service: .11/29/2023  Patient Care Team: Ngetich, Roxan BROCKS, NP as PCP - General (Family Medicine) Kate Lonni CROME, MD as PCP - Cardiology (Cardiology) Arlana Arnt, MD as Consulting Physician (Otolaryngology) Luis Purchase, MD as Consulting Physician (Gastroenterology) Raeanne, Shanda SQUIBB, MD (Inactive) as Consulting Physician (Obstetrics and Gynecology) Roz Anes, MD as Consulting Physician (Ophthalmology) Quinn Odor, [facility] (Inactive) as Pharmacist (Pharmacist) Onesimo Emaline Brink, MD as Consulting Physician (Hematology)  CHIEF COMPLAINTS/PURPOSE OF CONSULTATION:  Follow-up for continued evaluation and management of CLL with Richter's transformation  HISTORY OF PRESENTING ILLNESS:   Jacqueline Hancock is a wonderful 83 y.o. female who is here for continued evaluation and management of NHL, unspecified body region. Patient was following up with Dr. Amadeo.   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 BM Bx Cyclin D1 IHC +ve byt FISH neg . She has presented with splenomegaly, anemia, and lymphocytosis. 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   Patient's current treatment is Imbruvica  280 mg. She was started on Imbruvica  560 mg on January 2022, which was then reduced to 280 mg in July 2023.   Patient was last seen by Dr. Amadeo on 02/10/2022 and she was doing well overall.   Patient reports she has been doing well overall since our last visit. She does complains of itchy skin rashes, mostly around the face and head, for around 2-3 weeks ago. Patient notes that she was started couple of new medications from other physicians, but she is unsure of the names. Patient notes that she was started on oxycodone  around 3-4 weeks ago. She discontinued oxycodone .   She notes that there is redness around her face, which is not new.   Patient is currently  taking Imbruvica  280 mg as prescribed and has been tolerating it well without any severe toxicities.   She is currently taking Iron supplement, calcium supplement, and Vitamin-D supplement.   She denies fever, chills, night sweats, unexpected weight loss, back pain, abdominal pain, chest pain, or leg swelling. Patient does complain of occasional abdominal pain.   Patient notes she had a surgery near her right neck/ear area to remove an enlarged lymph node couple years ago. She complains of mild occasional pain near the site.  INTERVAL HISTORY:  Jacqueline Hancock is a wonderful 83 y.o. 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 She has completed her palliative radiation. She notes due to abdominal distention and pain and improved p.o. intake.  No uncontrolled nausea vomiting or diarrhea. We had a detailed goals of care discussion.  Patient is aware of the seriousness of her condition and that there are not too many treatment options that would be very effective in her context or that she could tolerate. She is still inclined to pursue additional treatments to try to control her disease. We discussed option for polatuzumab plus Rituxan  and she is agreeable with this. Her potassium levels are very low today and potassium replacement was ordered to be used carefully with her chronic kidney disease.  She is on aggressive diuresis by her PCP.  MEDICAL HISTORY:  Past Medical History:  Diagnosis Date   CLL (chronic lymphocytic leukemia) (HCC)    Congestive heart failure (CHF) (HCC) 09/2023   Dyspnea 2021   with exersion    Early cataracts, bilateral    Elevated hemoglobin A1c    Fibroids  GERD (gastroesophageal reflux disease)    Hyperlipidemia    Hypertension    Lymphoma (HCC)    Osteopenia     SURGICAL HISTORY: Past Surgical History:  Procedure Laterality Date   ABDOMINAL HYSTERECTOMY     APPENDECTOMY     BALLOON DILATION  N/A 08/31/2023   Procedure: BALLOON DILATION;  Surgeon: Charlanne Groom, MD;  Location: [facility];  Service: Gastroenterology;  Laterality: N/A;   CATARACT EXTRACTION, BILATERAL Bilateral 09/2017   Dr. 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   TONSILLECTOMY AND ADENOIDECTOMY      SOCIAL HISTORY: Social History   Socioeconomic History   Marital status: Widowed    Spouse name: Not on file   Number of children: 3   Years of education: Not on file   Highest education level: Not on file  Occupational History   Occupation: retired  Tobacco Use   Smoking status: Never    Passive exposure: Past   Smokeless tobacco: Never  Vaping Use   Vaping status: Never Used  Substance and Sexual Activity   Alcohol use: No   Drug use: Never   Sexual activity: Not on file  Other Topics Concern   Not on file  Social History Narrative   Not on file   Social Drivers of Health   Financial Resource Strain: Low Risk  (10/11/2023)   Overall Financial Resource Strain (CARDIA)    Difficulty of Paying Living Expenses: Not very hard  Food Insecurity: No Food Insecurity (11/13/2023)   Hunger Vital Sign    Worried About Running Out of Food in the Last Year: Never true    Ran Out of Food in the Last Year: Never true  Transportation Needs: No Transportation Needs (11/13/2023)   PRAPARE - Administrator, Civil Service (Medical): No    Lack of Transportation (Non-Medical): No  Physical Activity: Not on file  Stress: Not on file  Social Connections: Socially Isolated (11/13/2023)   Social Connection and Isolation Panel    Frequency of Communication with Friends and Family: More than three times a week    Frequency of Social Gatherings with Friends and  Family: Never    Attends Religious Services: Never    Database administrator or Organizations: No    Attends Banker Meetings: Never    Marital Status: Widowed  Intimate Partner Violence: Not At Risk (11/13/2023)   Humiliation, Afraid, Rape, and Kick questionnaire    Fear of Current or Ex-Partner: No    Emotionally Abused: No    Physically Abused: No    Sexually Abused: No    FAMILY HISTORY: Family History  Problem Relation Age of Onset   Leukemia Mother    Hypertension Father    Diabetes Sister    Heart disease Brother    Diabetes Brother    Diabetes Brother    Hypertension Son    Heart disease Son     ALLERGIES:  is allergic to peanut (diagnostic) and nexium [esomeprazole magnesium ].  MEDICATIONS:  Current Outpatient Medications  Medication Sig Dispense Refill   magnesium  oxide (MAG-OX) 400 (240 Mg) MG tablet Take 1 tablet (400 mg total) by mouth daily. 30 tablet 0   potassium chloride  SA (KLOR-CON  M) 20 MEQ tablet Take 1 tablet (  20 mEq total) by mouth daily. Take twice a day for 3 days then once daily 60 tablet 0   aspirin  81 MG chewable tablet Chew 1 tablet (81 mg total) by mouth daily. 90 tablet 0   empagliflozin  (JARDIANCE ) 10 MG TABS tablet Take 1 tablet (10 mg total) by mouth daily before breakfast. 30 tablet 5   entecavir  (BARACLUDE ) 0.5 MG tablet Take 1 tablet (0.5 mg total) by mouth every 3 (three) days. (Patient not taking: Reported on 11/13/2023) 30 tablet 2   ondansetron  (ZOFRAN ) 8 MG tablet Take 8 mg by mouth every 8 (eight) hours as needed for nausea or vomiting.     pantoprazole  (PROTONIX ) 40 MG tablet Take 1 tablet (40 mg total) by mouth 2 (two) times daily for 56 days, THEN 1 tablet (40 mg total) daily. (Patient taking differently: Take 1 tablet (40mg ) by mouth twice daily.) 142 tablet 0   polyethylene glycol (MIRALAX  / GLYCOLAX ) 17 g packet Take 17 g by mouth daily as needed for mild constipation or moderate constipation. Also available OTC 30 each  0   torsemide  (DEMADEX ) 20 MG tablet TAKE 1 TABLET BY MOUTH EVERY DAY AS NEEDED FOR LEG EDEMA OR ABRUPT WEIGHT GAIN 90 tablet 1   traMADol  (ULTRAM ) 50 MG tablet Take 1 tablet (50 mg total) by mouth every 8 (eight) hours as needed for moderate pain (pain score 4-6) or severe pain (pain score 7-10) (pain). 20 tablet 0   No current facility-administered medications for this visit.   Facility-Administered Medications Ordered in Other Visits  Medication Dose Route Frequency Provider Last Rate Last Admin   pegfilgrastim -cbqv (UDENYCA ) injection 6 mg  6 mg Subcutaneous Once Ezrah Dembeck Kishore, MD        REVIEW OF SYSTEMS:   .10 Point review of Systems was done is negative except as noted above.  PHYSICAL EXAMINATION: ECOG PERFORMANCE STATUS: 3  . Vitals:   11/29/23 1006  BP: (!) 152/68  Pulse: 75  Resp: 18  Temp: (!) 97.3 F (36.3 C)  SpO2: 100%     Filed Weights   11/29/23 1006  Weight: 111 lb 3.2 oz (50.4 kg)   . Wt Readings from Last 3 Encounters:  11/29/23 111 lb 3.2 oz (50.4 kg)  11/13/23 117 lb 9.6 oz (53.3 kg)  11/13/23 117 lb 9.6 oz (53.3 kg)     .Body mass index is 22.46 kg/m. SABRA GENERAL:alert, in no acute distress and comfortable SKIN: no acute rashes, no significant lesions EYES: conjunctiva are pink and non-injected, sclera anicteric OROPHARYNX: MMM, no exudates, no oropharyngeal erythema or ulceration NECK: supple, no JVD LYMPH:  no palpable lymphadenopathy in the cervical, axillary or inguinal regions LUNGS: clear to auscultation b/l with normal respiratory effort HEART: regular rate & rhythm ABDOMEN:  normoactive bowel sounds , non tender, mild abdominal distention.  No tenderness to palpation. Extremity:2+ pedal edema PSYCH: alert & oriented x 3 with fluent speech NEURO: no focal motor/sensory deficits   LABORATORY DATA:  I have reviewed the data as listed  .    Latest Ref Rng & Units 11/29/2023    9:05 AM 11/21/2023    3:44 PM 11/15/2023     5:00 AM  CBC  WBC 4.0 - 10.5 K/uL 3.4  8.0  12.3   Hemoglobin 12.0 - 15.0 g/dL 8.7  9.6  9.3   Hematocrit 36.0 - 46.0 % 26.5  29.7  29.5   Platelets 150 - 400 K/uL 173  211  236    .  Latest Ref Rng & Units 11/29/2023    9:05 AM 11/21/2023    3:44 PM 11/17/2023    3:43 PM  CMP  Glucose 70 - 99 mg/dL 881  889  897   BUN 8 - 23 mg/dL 41  60  52   Creatinine 0.44 - 1.00 mg/dL 8.07  7.66  7.79   Sodium 135 - 145 mmol/L 138  137  136   Potassium 3.5 - 5.1 mmol/L 2.9  3.5  4.0   Chloride 98 - 111 mmol/L 102  99  99   CO2 22 - 32 mmol/L 28  23  22    Calcium 8.9 - 10.3 mg/dL 9.2  89.9  89.9   Total Protein 6.5 - 8.1 g/dL 5.4     Total Bilirubin 0.0 - 1.2 mg/dL 0.9     Alkaline Phos 38 - 126 U/L 287     AST 15 - 41 U/L 11     ALT 0 - 44 U/L 10      . Lab Results  Component Value Date   LDH 411 (H) 11/13/2023   Component     Latest Ref Rng 08/18/2023  HBV DNA SERPL PCR-ACNC     IU/mL 110   HBV DNA SERPL PCR-LOG IU     log10 IU/mL 2.041   Test Info: Comment   Hepatitis A AB,Total     NON-REACTIVE  REACTIVE !   HCV Ab     NON REACTIVE  NON REACTIVE   HEP B CORE AB     Negative  Positive !   Hepatitis B Surface Ag     NON REACTIVE  Reactive !   Uric Acid, Serum     2.5 - 7.1 mg/dL     Legend: ! Abnormal   PATHOLOGY Surgical Pathology CASE: [id number] PATIENT: Susi Lochner Bone Marrow Report     Clinical History: lymphocytosis, possible lymphoma     DIAGNOSIS:  BONE MARROW, ASPIRATE, CLOT, CORE: -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  The latter is primarily seen by flow cytometry.  The findings favor mantle cell lymphoma but  correlation with cytogenetic and FISH studies is recommended.  MICROSCOPIC DESCRIPTION:  PERIPHERAL BLOOD SMEAR: The red blood cells display prominent anisocytosis with macrocytic and normocytic cells.  There is mild to moderate poikilocytosis with teardrop cells, elliptocytes.  There is moderate polychromasia.  The white blood cells are increased in number with lymphocytosis characterized by predominance of small to medium sized lymphoid cells displaying high nuclear cytoplasmic ratio, round to irregular nuclei, coarse chromatin and small to inconspicuous nucleoli. The platelets are normal in number.     RADIOGRAPHIC STUDIES: I have personally reviewed the radiological images as listed and agreed with the findings in the report. VAS US  LOWER EXTREMITY VENOUS (DVT) Result Date: 11/14/2023  Lower Venous DVT Study Patient Name:  MELLANY DINSMORE  Date of Exam:   11/14/2023 Medical Rec #: [id number]    Accession #:    [id number] Date of Birth: 1940-11-02    Patient Gender: F Patient Age:   34 years Exam Location:  [facility] Procedure:      VAS US  LOWER EXTREMITY VENOUS (DVT) Referring Phys: OWEN REGALADO --------------------------------------------------------------------------------  Indications: Edema.  Performing Technologist: Laasco Crump, RVT  Examination Guidelines: A complete evaluation includes B-mode imaging, spectral Doppler, color Doppler, and power Doppler as needed of all accessible portions of each vessel. Bilateral testing is considered an integral part of a complete examination. Limited examinations for reoccurring indications may be performed as noted. The reflux portion of the exam is performed with the patient in reverse Trendelenburg.  +---------+---------------+---------+-----------+----------+--------------+ RIGHT    CompressibilityPhasicitySpontaneityPropertiesThrombus Aging +---------+---------------+---------+-----------+----------+--------------+ CFV      Full            Yes      Yes                                 +---------+---------------+---------+-----------+----------+--------------+ SFJ      Full                                                        +---------+---------------+---------+-----------+----------+--------------+ FV Prox  Full                                                        +---------+---------------+---------+-----------+----------+--------------+ FV Mid   Full                                                        +---------+---------------+---------+-----------+----------+--------------+ FV DistalFull                                                        +---------+---------------+---------+-----------+----------+--------------+ PFV      Full                                                        +---------+---------------+---------+-----------+----------+--------------+ POP      Full           Yes      Yes                                 +---------+---------------+---------+-----------+----------+--------------+ PTV      Full                                                        +---------+---------------+---------+-----------+----------+--------------+ PERO     Full                                                        +---------+---------------+---------+-----------+----------+--------------+   +---------+---------------+---------+-----------+----------+--------------+  LEFT     CompressibilityPhasicitySpontaneityPropertiesThrombus Aging +---------+---------------+---------+-----------+----------+--------------+ CFV      Full           Yes      Yes                                 +---------+---------------+---------+-----------+----------+--------------+ SFJ      Full                                                        +---------+---------------+---------+-----------+----------+--------------+ FV Prox  Full                                                         +---------+---------------+---------+-----------+----------+--------------+ FV Mid   Full                                                        +---------+---------------+---------+-----------+----------+--------------+ FV DistalFull                                                        +---------+---------------+---------+-----------+----------+--------------+ PFV      Full                                                        +---------+---------------+---------+-----------+----------+--------------+ POP      Full           Yes      Yes                                 +---------+---------------+---------+-----------+----------+--------------+ PTV      Full                                                        +---------+---------------+---------+-----------+----------+--------------+ PERO     Full                                                        +---------+---------------+---------+-----------+----------+--------------+     Summary: RIGHT: - There is no evidence of deep vein thrombosis in the lower extremity.  - No cystic structure found in the popliteal fossa.  LEFT: - There is no evidence of deep vein thrombosis in the lower extremity.  - No  cystic structure found in the popliteal fossa.  *See table(s) above for measurements and observations. Electronically signed by Penne Colorado MD on 11/14/2023 at 10:06:23 PM.    Final    US  RENAL Result Date: 11/13/2023 CLINICAL DATA:  Acute kidney injury.  Diffuse large B-cell lymphoma. EXAM: RENAL / URINARY TRACT ULTRASOUND COMPLETE COMPARISON:  PET CT 10/19/2023. CT abdomen and pelvis 10/06/2023. FINDINGS: Right Kidney: Heterogeneous mass is seen in the region of the right kidney measuring 20.3 x 10.1 x 16.1 cm. Normal renal parenchyma is unable to be differentiated from this mass. Overall dimensions are similar to prior CT given differences in technique. Left Kidney: Renal measurements: 12.2 x 5.0 x 4.8 cm =  volume: 152 mL. Echogenicity within normal limits. There is mild hydronephrosis. No focal lesion identified. Bladder: Appears normal for degree of bladder distention. Other: None. IMPRESSION: 1. Grossly unchanged heterogeneous mass in the region of the right kidney measuring 20.3 x 10.1 x 16.1 cm. Normal renal parenchyma is unable to be differentiated from this mass. 2. Mild left hydronephrosis. Electronically Signed   By: Greig Pique M.D.   On: 11/13/2023 16:38   DG Chest Portable 1 View Result Date: 11/13/2023 EXAM: 1 VIEW XRAY OF THE CHEST 11/13/2023 12:22:57 PM COMPARISON: AP radiograph of the chest dated 10/06/2023. CLINICAL HISTORY: CHF. Per chart: Pt presents from [facility] for HGB of 6.6. H/x non hodgkins lymphoma, HF, Hep B. FINDINGS: LUNGS AND PLEURA: The hazy pulmonary opacities noted on the previous study have resolved in the interim. No pleural effusion. No pneumothorax. HEART AND MEDIASTINUM: The heart is borderline in size. No acute abnormality of the mediastinal silhouette. BONES AND SOFT TISSUES: No acute osseous abnormality. An endotracheal tube and gastric tube have been removed. A right internal jugular chest port remains in place. IMPRESSION: 1. Resolution of hazy pulmonary opacities noted on the previous study. 2. Borderline heart size. Electronically signed by: evalene coho 11/13/2023 01:39 PM EDT RP Workstation: [id number]    ASSESSMENT & PLAN:   83 y.o. woman with:   1.  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 Though discussed with patient that FISH was neg and so Mantle cell lymphoma not confirm. Findings more consistent with CLL.  2.  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  3.  High risk of tumor lysis syndrome  4.  Autoimmune hemolytic anemia: hgb continues to be in normal range without  any evidence of hemolysis.  5) history of hypercalcemia - ? From dehydration vs recurrent lymphoma  6) hepatitis B chronic active disease  PLAN:  - Discussed patient's labs from today in detail with her and her son. CBC shows anemia with a hemoglobin of 8.7 WBC count of 3.4k and platelets of 173k CMP shows potassium of 2.9 with a creatinine of 1.9. Patient is on aggressive diuresis with torsemide  with additional diuresis related to Jardiance  that is causing her to be hypokalemic.  Potassium replacement ordered. Had detailed goals of care discussion with the patient about whether she would like to pursue best supportive cares or if she is inclined to consider any other medications to try to control her lymphoma. We will get a CT chest abdomen pelvis without contrast to try to evaluate the current status of her lymphoma. We discussed option to do Rituxan  plus chemo immuno therapy drug conjugate IV polatuzumab without the Bendamustine to try to control  the disease. Patient will need to continue her hepatitis B suppressive treatments and continues to be on Entecavir  per ID. Will continue allopurinol  for TLS prophylaxis Continue close follow-up with PCP for management of congestive heart failure and other medical issues.  Follow-up CT CAP w/o contrast in 1 week Schedule to start Polatuzumab in 2-3 weeks with portflush, labs and MD visit   The total time spent in the appointment was 40 minutes*.  All of the patient's questions were answered with apparent satisfaction. The patient knows to call the clinic with any problems, questions or concerns.   Emaline Saran MD MS AAHIVMS [facility] Hematology/Oncology Physician [facility]  .*Total Encounter Time as defined by the Centers for Medicare and Medicaid Services includes, in addition to the face-to-face time of a patient visit (documented in the note above) non-face-to-face time: obtaining and reviewing outside history, ordering and  reviewing medications, tests or procedures, care coordination (communications with other health care professionals or caregivers) and documentation in the medical record.

## 2023-12-07 ENCOUNTER — Telehealth: Payer: Self-pay | Admitting: Hematology

## 2023-12-08 NOTE — Progress Notes (Addendum)
 Pharmacist Chemotherapy Monitoring - Initial Assessment    Anticipated start date: 12/15/23   The following has been reviewed per standard work regarding the patient's treatment regimen: The patient's diagnosis, treatment plan and drug doses, and organ/hematologic function Lab orders and baseline tests specific to treatment regimen  The treatment plan start date, drug sequencing, and pre-medications Prior authorization status  Patient's documented medication list, including drug-drug interaction screen and prescriptions for anti-emetics and supportive care specific to the treatment regimen The drug concentrations, fluid compatibility, administration routes, and timing of the medications to be used The patient's access for treatment and lifetime cumulative dose history, if applicable  The patient's medication allergies and previous infusion related reactions, if applicable   Changes made to treatment plan:  treatment plan date  Follow up needed:  N/A  Harlene JONELLE Nasuti, RPH, 12/08/2023  12:03 PM

## 2023-12-12 ENCOUNTER — Encounter: Payer: Self-pay | Admitting: Hematology

## 2023-12-12 ENCOUNTER — Ambulatory Visit: Payer: Self-pay | Admitting: Internal Medicine

## 2023-12-12 ENCOUNTER — Telehealth (HOSPITAL_COMMUNITY): Payer: Self-pay | Admitting: Cardiology

## 2023-12-12 NOTE — Telephone Encounter (Signed)
 Called to confirm/remind patient of their appointment [facility] on [facility]/12/[facility].   Appointment:   [] Confirmed  [] Left mess   [x] No answer/No voice mail  [] VM Full/unable to leave message  [] Phone not in service  Patient reminded to bring all medications and/or complete list.  Confirmed patient has transportation. Gave directions, instructed to utilize valet parking.

## 2023-12-13 ENCOUNTER — Other Ambulatory Visit: Payer: Self-pay | Admitting: Hematology

## 2023-12-13 ENCOUNTER — Inpatient Hospital Stay: Admitting: Physician Assistant

## 2023-12-13 ENCOUNTER — Inpatient Hospital Stay

## 2023-12-13 ENCOUNTER — Inpatient Hospital Stay: Attending: Hematology | Admitting: Physician Assistant

## 2023-12-13 ENCOUNTER — Encounter (HOSPITAL_COMMUNITY): Admitting: Cardiology

## 2023-12-13 ENCOUNTER — Ambulatory Visit (HOSPITAL_COMMUNITY): Admission: RE | Admit: 2023-12-13 | Source: Ambulatory Visit

## 2023-12-13 VITALS — BP 134/71 | HR 90 | Temp 97.7°F | Resp 18 | Ht 59.0 in | Wt 107.8 lb

## 2023-12-13 DIAGNOSIS — C9110 Chronic lymphocytic leukemia of B-cell type not having achieved remission: Secondary | ICD-10-CM | POA: Insufficient documentation

## 2023-12-13 DIAGNOSIS — Z79624 Long term (current) use of inhibitors of nucleotide synthesis: Secondary | ICD-10-CM | POA: Diagnosis not present

## 2023-12-13 DIAGNOSIS — M8580 Other specified disorders of bone density and structure, unspecified site: Secondary | ICD-10-CM | POA: Insufficient documentation

## 2023-12-13 DIAGNOSIS — K219 Gastro-esophageal reflux disease without esophagitis: Secondary | ICD-10-CM | POA: Insufficient documentation

## 2023-12-13 DIAGNOSIS — I110 Hypertensive heart disease with heart failure: Secondary | ICD-10-CM | POA: Insufficient documentation

## 2023-12-13 DIAGNOSIS — D539 Nutritional anemia, unspecified: Secondary | ICD-10-CM | POA: Insufficient documentation

## 2023-12-13 DIAGNOSIS — C8333 Diffuse large B-cell lymphoma, intra-abdominal lymph nodes: Secondary | ICD-10-CM | POA: Diagnosis not present

## 2023-12-13 DIAGNOSIS — B181 Chronic viral hepatitis B without delta-agent: Secondary | ICD-10-CM | POA: Diagnosis not present

## 2023-12-13 DIAGNOSIS — I509 Heart failure, unspecified: Secondary | ICD-10-CM | POA: Insufficient documentation

## 2023-12-13 DIAGNOSIS — Z7952 Long term (current) use of systemic steroids: Secondary | ICD-10-CM | POA: Diagnosis not present

## 2023-12-13 DIAGNOSIS — E785 Hyperlipidemia, unspecified: Secondary | ICD-10-CM | POA: Insufficient documentation

## 2023-12-13 DIAGNOSIS — Z5112 Encounter for antineoplastic immunotherapy: Secondary | ICD-10-CM | POA: Diagnosis not present

## 2023-12-13 DIAGNOSIS — C8338 Diffuse large B-cell lymphoma, lymph nodes of multiple sites: Secondary | ICD-10-CM

## 2023-12-13 DIAGNOSIS — Z604 Social exclusion and rejection: Secondary | ICD-10-CM | POA: Diagnosis not present

## 2023-12-13 DIAGNOSIS — Z79899 Other long term (current) drug therapy: Secondary | ICD-10-CM | POA: Diagnosis not present

## 2023-12-13 DIAGNOSIS — Z7984 Long term (current) use of oral hypoglycemic drugs: Secondary | ICD-10-CM | POA: Diagnosis not present

## 2023-12-13 DIAGNOSIS — Z7982 Long term (current) use of aspirin: Secondary | ICD-10-CM | POA: Diagnosis not present

## 2023-12-13 LAB — CMP (CANCER CENTER ONLY)
ALT: 8 U/L (ref 0–44)
AST: 12 U/L — ABNORMAL LOW (ref 15–41)
Albumin: 3.8 g/dL (ref 3.5–5.0)
Alkaline Phosphatase: 119 U/L (ref 38–126)
Anion gap: 7 (ref 5–15)
BUN: 59 mg/dL — ABNORMAL HIGH (ref 8–23)
CO2: 26 mmol/L (ref 22–32)
Calcium: 9.2 mg/dL (ref 8.9–10.3)
Chloride: 103 mmol/L (ref 98–111)
Creatinine: 2.01 mg/dL — ABNORMAL HIGH (ref 0.44–1.00)
GFR, Estimated: 24 mL/min — ABNORMAL LOW (ref >60)
Glucose, Bld: 115 mg/dL — ABNORMAL HIGH (ref 70–99)
Potassium: 4 mmol/L (ref 3.5–5.1)
Sodium: 136 mmol/L (ref 135–145)
Total Bilirubin: 0.6 mg/dL (ref 0.0–1.2)
Total Protein: 6.2 g/dL — ABNORMAL LOW (ref 6.5–8.1)

## 2023-12-13 LAB — CBC WITH DIFFERENTIAL (CANCER CENTER ONLY)
Abs Immature Granulocytes: 0.03 K/uL (ref 0.00–0.07)
Basophils Absolute: 0 K/uL (ref 0.0–0.1)
Basophils Relative: 0 %
Eosinophils Absolute: 0.3 K/uL (ref 0.0–0.5)
Eosinophils Relative: 6 %
HCT: 26.5 % — ABNORMAL LOW (ref 36.0–46.0)
Hemoglobin: 8.5 g/dL — ABNORMAL LOW (ref 12.0–15.0)
Immature Granulocytes: 1 %
Lymphocytes Relative: 24 %
Lymphs Abs: 1.2 K/uL (ref 0.7–4.0)
MCH: 28 pg (ref 26.0–34.0)
MCHC: 32.1 g/dL (ref 30.0–36.0)
MCV: 87.2 fL (ref 80.0–100.0)
Monocytes Absolute: 0.6 K/uL (ref 0.1–1.0)
Monocytes Relative: 11 %
Neutro Abs: 2.9 K/uL (ref 1.7–7.7)
Neutrophils Relative %: 58 %
Platelet Count: 157 K/uL (ref 150–400)
RBC: 3.04 MIL/uL — ABNORMAL LOW (ref 3.87–5.11)
RDW: 15.9 % — ABNORMAL HIGH (ref 11.5–15.5)
WBC Count: 5 K/uL (ref 4.0–10.5)
nRBC: 0 % (ref 0.0–0.2)

## 2023-12-13 LAB — SAMPLE TO BLOOD BANK

## 2023-12-13 NOTE — Progress Notes (Signed)
 HEMATOLOGY/ONCOLOGY CLINIC NOTE  Date of Service: 10/04/23  Patient Care Team: Ngetich, Roxan BROCKS, NP as PCP - General (Family Medicine) Kate Lonni CROME, MD as PCP - Cardiology (Cardiology) Arlana Arnt, MD as Consulting Physician (Otolaryngology) Luis Purchase, MD as Consulting Physician (Gastroenterology) Raeanne, Shanda SQUIBB, MD (Inactive) as Consulting Physician (Obstetrics and Gynecology) Roz Anes, MD as Consulting Physician (Ophthalmology) Quinn Odor, [facility] (Inactive) as Pharmacist (Pharmacist) Onesimo Emaline Brink, MD as Consulting Physician (Hematology)  CHIEF COMPLAINTS/PURPOSE OF CONSULTATION:  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  She was last seen by Dr. Onesimo on 11/29/2023.  She is accompanied by her son for this visit.  Ms. Veale reports her energy is fairly stable since the last visit. She uses a walker to help with ambulation at home. She has a good appetite but there is progressive weight loss. She has lost 10 lbs in the last 4 weeks.She adds that the weight loss is likely improvement of her lower leg edema. She denies fevers, chills, night sweats, shortness of breath, chest pain or cough. She has no other complaints. Rest of the ROS is below.   MEDICAL HISTORY:  Past Medical History:  Diagnosis Date   CLL (chronic lymphocytic leukemia) (HCC)    Congestive heart failure (CHF) (HCC) 09/2023   Dyspnea 2021   with exersion    Early cataracts, bilateral    Elevated hemoglobin A1c    Fibroids    GERD (gastroesophageal reflux disease)    Hyperlipidemia    Hypertension    Lymphoma (HCC)    Osteopenia     SURGICAL HISTORY: Past Surgical History:  Procedure Laterality Date   ABDOMINAL HYSTERECTOMY     APPENDECTOMY     BALLOON DILATION N/A 08/31/2023   Procedure: BALLOON  DILATION;  Surgeon: Charlanne Groom, MD;  Location: [facility];  Service: Gastroenterology;  Laterality: N/A;   CATARACT EXTRACTION, BILATERAL Bilateral 09/2017   Dr. 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   TONSILLECTOMY AND ADENOIDECTOMY      SOCIAL HISTORY: Social History   Socioeconomic History   Marital status: Widowed    Spouse name: Not on file   Number of children: 3   Years of education: Not on file   Highest education level: Not on file  Occupational History   Occupation: retired  Tobacco Use   Smoking status: Never    Passive exposure: Past   Smokeless tobacco: Never  Vaping Use   Vaping status: Never Used  Substance and Sexual Activity   Alcohol use: No   Drug use: Never   Sexual activity: Not on file  Other Topics Concern   Not on file  Social History Narrative   Not on file   Social Drivers of Health   Financial Resource Strain: Low Risk  (10/11/2023)   Overall Financial Resource Strain (CARDIA)    Difficulty of Paying Living Expenses: Not very hard  Food Insecurity: No Food Insecurity (11/13/2023)   Hunger Vital Sign    Worried About Running Out of Food in the Last Year: Never true    Ran Out of Food in the  Last Year: Never true  Transportation Needs: No Transportation Needs (11/13/2023)   PRAPARE - Administrator, Civil Service (Medical): No    Lack of Transportation (Non-Medical): No  Physical Activity: Not on file  Stress: Not on file  Social Connections: Socially Isolated (11/13/2023)   Social Connection and Isolation Panel    Frequency of Communication with Friends and Family: More than three times a week    Frequency of Social Gatherings with Friends and Family: Never    Attends Religious  Services: Never    Database administrator or Organizations: No    Attends Banker Meetings: Never    Marital Status: Widowed  Intimate Partner Violence: Not At Risk (11/13/2023)   Humiliation, Afraid, Rape, and Kick questionnaire    Fear of Current or Ex-Partner: No    Emotionally Abused: No    Physically Abused: No    Sexually Abused: No    FAMILY HISTORY: Family History  Problem Relation Age of Onset   Leukemia Mother    Hypertension Father    Diabetes Sister    Heart disease Brother    Diabetes Brother    Diabetes Brother    Hypertension Son    Heart disease Son     ALLERGIES:  is allergic to peanut (diagnostic) and nexium [esomeprazole magnesium ].  MEDICATIONS:  Current Outpatient Medications  Medication Sig Dispense Refill   acyclovir  (ZOVIRAX ) 400 MG tablet Take 1 tablet (400 mg total) by mouth 2 (two) times daily. 60 tablet 5   allopurinol  (ZYLOPRIM ) 300 MG tablet Take 0.5 tablets (150 mg total) by mouth daily. 30 tablet 1   aspirin  81 MG chewable tablet Chew 1 tablet (81 mg total) by mouth daily. 90 tablet 0   dexamethasone  (DECADRON ) 4 MG tablet Take 2 tablets (8 mg total) by mouth daily. Start the day after bendamustine chemotherapy for 2 days. Take with food. 30 tablet 1   empagliflozin  (JARDIANCE ) 10 MG TABS tablet Take 1 tablet (10 mg total) by mouth daily before breakfast. 30 tablet 5   magnesium  oxide (MAG-OX) 400 (240 Mg) MG tablet Take 1 tablet (400 mg total) by mouth daily. 30 tablet 0   ondansetron  (ZOFRAN ) 8 MG tablet Take 8 mg by mouth every 8 (eight) hours as needed for nausea or vomiting.     ondansetron  (ZOFRAN ) 8 MG tablet Take 1 tablet (8 mg total) by mouth every 8 (eight) hours as needed for nausea or vomiting. Start on the third day after chemotherapy. 30 tablet 1   polyethylene glycol (MIRALAX  / GLYCOLAX ) 17 g packet Take 17 g by mouth daily as needed for mild constipation or moderate constipation. Also available OTC 30 each 0    potassium chloride  SA (KLOR-CON  M) 20 MEQ tablet Take 1 tablet (20 mEq total) by mouth daily. Take twice a day for 3 days then once daily 60 tablet 0   prochlorperazine  (COMPAZINE ) 10 MG tablet Take 1 tablet (10 mg total) by mouth every 6 (six) hours as needed for nausea or vomiting. 30 tablet 1   torsemide  (DEMADEX ) 20 MG tablet TAKE 1 TABLET BY MOUTH EVERY DAY AS NEEDED FOR LEG EDEMA OR ABRUPT WEIGHT GAIN 90 tablet 1   traMADol  (ULTRAM ) 50 MG tablet Take 1 tablet (50 mg total) by mouth every 8 (eight) hours as needed for moderate pain (pain score 4-6) or severe pain (pain score 7-10) (pain). 20 tablet 0   entecavir  (BARACLUDE ) 0.5 MG tablet Take 1 tablet (0.5 mg  total) by mouth every 3 (three) days. (Patient not taking: Reported on 12/13/2023) 30 tablet 2   lidocaine -prilocaine  (EMLA ) cream Apply to affected area once (Patient not taking: Reported on 12/13/2023) 30 g 3   pantoprazole  (PROTONIX ) 40 MG tablet Take 1 tablet (40 mg total) by mouth 2 (two) times daily for 56 days, THEN 1 tablet (40 mg total) daily. (Patient taking differently: Take 1 tablet (40mg ) by mouth twice daily.) 142 tablet 0   No current facility-administered medications for this visit.    REVIEW OF SYSTEMS:    10 Point review of Systems was done is negative except as noted above.   PHYSICAL EXAMINATION: ECOG PERFORMANCE STATUS: 2 - Symptomatic, <50% confined to bed  . Vitals:   12/13/23 1351  BP: 134/71  Pulse: 90  Resp: 18  Temp: 97.7 F (36.5 C)  SpO2: 99%    Filed Weights   12/13/23 1351  Weight: 107 lb 12.8 oz (48.9 kg)   .Body mass index is 21.77 kg/m.   GENERAL:alert, in no acute distress and comfortable SKIN: no acute rashes, no significant lesions EYES: conjunctiva are pink and non-injected, sclera anicteric LUNGS: clear to auscultation b/l with normal respiratory effort HEART: regular rate & rhythm  Extremity: no pedal edema PSYCH: alert & oriented x 3 with fluent speech NEURO: no focal  motor/sensory deficits   LABORATORY DATA:  I have reviewed the data as listed  .    Latest Ref Rng & Units 12/13/2023    1:09 PM 11/29/2023    9:05 AM 11/21/2023    3:44 PM  CBC  WBC 4.0 - 10.5 K/uL 5.0  3.4  8.0   Hemoglobin 12.0 - 15.0 g/dL 8.5  8.7  9.6   Hematocrit 36.0 - 46.0 % 26.5  26.5  29.7   Platelets 150 - 400 K/uL 157  173  211    .    Latest Ref Rng & Units 12/13/2023    1:09 PM 11/29/2023    9:05 AM 11/21/2023    3:44 PM  CMP  Glucose 70 - 99 mg/dL 884  881  889   BUN 8 - 23 mg/dL 59  41  60   Creatinine 0.44 - 1.00 mg/dL 7.98  8.07  7.66   Sodium 135 - 145 mmol/L 136  138  137   Potassium 3.5 - 5.1 mmol/L 4.0  2.9  3.5   Chloride 98 - 111 mmol/L 103  102  99   CO2 22 - 32 mmol/L 26  28  23    Calcium 8.9 - 10.3 mg/dL 9.2  9.2  89.9   Total Protein 6.5 - 8.1 g/dL 6.2  5.4    Total Bilirubin 0.0 - 1.2 mg/dL 0.6  0.9    Alkaline Phos 38 - 126 U/L 119  287    AST 15 - 41 U/L 12  11    ALT 0 - 44 U/L 8  10     . Lab Results  Component Value Date   LDH 411 (H) 11/13/2023     PATHOLOGY Surgical Pathology CASE: [id number] PATIENT: Annjeanette Burston Bone Marrow Report     Clinical History: lymphocytosis, possible lymphoma     DIAGNOSIS:  BONE MARROW, ASPIRATE, CLOT, CORE: -Hypercellular bone marrow with extensive involvement by a B-cell lymphoproliferative disorder -See comment  PERIPHERAL BLOOD: -Macrocytic anemia -Marked lymphocytosis consistent with lymphoproliferative disorder  COMMENT:  There is prominent involvement by a B-cell lymphoproliferative process associated with cyclin D1 positivity and partial expression  of CD5.  The latter is primarily seen by flow cytometry.  The findings favor mantle cell lymphoma but correlation with cytogenetic and FISH studies is recommended.  MICROSCOPIC DESCRIPTION:  PERIPHERAL BLOOD SMEAR: The red blood cells display prominent anisocytosis with macrocytic and normocytic cells.  There is mild  to moderate poikilocytosis with teardrop cells, elliptocytes.  There is moderate polychromasia.  The white blood cells are increased in number with lymphocytosis characterized by predominance of small to medium sized lymphoid cells displaying high nuclear cytoplasmic ratio, round to irregular nuclei, coarse chromatin and small to inconspicuous nucleoli. The platelets are normal in number.     RADIOGRAPHIC STUDIES: I have personally reviewed the radiological images as listed and agreed with the findings in the report. VAS US  LOWER EXTREMITY VENOUS (DVT) Result Date: 11/14/2023  Lower Venous DVT Study Patient Name:  ASSYRIA MORREALE  Date of Exam:   11/14/2023 Medical Rec #: [id number]    Accession #:    [id number] Date of Birth: 01/17/1941    Patient Gender: F Patient Age:   54 years Exam Location:  [facility] Procedure:      VAS US  LOWER EXTREMITY VENOUS (DVT) Referring Phys: OWEN REGALADO --------------------------------------------------------------------------------  Indications: Edema.  Performing Technologist: Vernon Lindau, RVT  Examination Guidelines: A complete evaluation includes B-mode imaging, spectral Doppler, color Doppler, and power Doppler as needed of all accessible portions of each vessel. Bilateral testing is considered an integral part of a complete examination. Limited examinations for reoccurring indications may be performed as noted. The reflux portion of the exam is performed with the patient in reverse Trendelenburg.  +---------+---------------+---------+-----------+----------+--------------+ RIGHT    CompressibilityPhasicitySpontaneityPropertiesThrombus Aging +---------+---------------+---------+-----------+----------+--------------+ CFV      Full           Yes      Yes                                 +---------+---------------+---------+-----------+----------+--------------+ SFJ      Full                                                         +---------+---------------+---------+-----------+----------+--------------+ FV Prox  Full                                                        +---------+---------------+---------+-----------+----------+--------------+ FV Mid   Full                                                        +---------+---------------+---------+-----------+----------+--------------+ FV DistalFull                                                        +---------+---------------+---------+-----------+----------+--------------+ PFV      Full                                                        +---------+---------------+---------+-----------+----------+--------------+  POP      Full           Yes      Yes                                 +---------+---------------+---------+-----------+----------+--------------+ PTV      Full                                                        +---------+---------------+---------+-----------+----------+--------------+ PERO     Full                                                        +---------+---------------+---------+-----------+----------+--------------+   +---------+---------------+---------+-----------+----------+--------------+ LEFT     CompressibilityPhasicitySpontaneityPropertiesThrombus Aging +---------+---------------+---------+-----------+----------+--------------+ CFV      Full           Yes      Yes                                 +---------+---------------+---------+-----------+----------+--------------+ SFJ      Full                                                        +---------+---------------+---------+-----------+----------+--------------+ FV Prox  Full                                                        +---------+---------------+---------+-----------+----------+--------------+ FV Mid   Full                                                         +---------+---------------+---------+-----------+----------+--------------+ FV DistalFull                                                        +---------+---------------+---------+-----------+----------+--------------+ PFV      Full                                                        +---------+---------------+---------+-----------+----------+--------------+ POP      Full           Yes      Yes                                 +---------+---------------+---------+-----------+----------+--------------+  PTV      Full                                                        +---------+---------------+---------+-----------+----------+--------------+ PERO     Full                                                        +---------+---------------+---------+-----------+----------+--------------+     Summary: RIGHT: - There is no evidence of deep vein thrombosis in the lower extremity.  - No cystic structure found in the popliteal fossa.  LEFT: - There is no evidence of deep vein thrombosis in the lower extremity.  - No cystic structure found in the popliteal fossa.  *See table(s) above for measurements and observations. Electronically signed by Penne Colorado MD on 11/14/2023 at 10:06:23 PM.    Final      ASSESSMENT & PLAN:  Jacqueline Hancock is a 83 y.o. woman with:   1.  Chronic Lymphocytic Leukemia - CD20 positive, CD5 positive --Presented with hemolytic anemia and splenomegaly in December 2021. Bone marrow biopsy confirmed  --Started prednisone  60 mg daily in January 2022 to treat her hemolytic anemia.  This was tapered off after complete hematological response.  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 --Biopsy of large retroperitoneal mass from 08/04/2023 showed signs of CLL as well as areas that have histologically  transformed from CLL to large B cell lymphoma  --Recommended R-mini CHOP, started on 08/23/2023 --PET/CT scan from 10/19/2023 did not show as much response as we would expect. We discussed that there is partial response in the main tumor surrounding the kidney in the abdm. There is not much disease outside of the abdomen. The main mass in the right abdm is less active, though there are a couple of new areas of disease which are not large but are more active.   --Recommend to switch to Rituximab  and Polatuzumab, tentative start on 12/15/2023.   3. Hepatitis B chronic active disease --Continue to follow up with infectious disease.   PLAN: --Due to start Cycle 1, Day 1 of Rituximab  and Polatuzumab this coming Friday, 12/15/2023. --Labs from today were reviewed and adequate for treatment. WBC 5.0, Hgb 8.5, Plt 157K, Creatinine overall stable at 2.01, LFTs normal.  --Proceed with treatment on Friday without any dose modification --Continue with acyclovir  for shingles prophylaxis.  --Continue allopurinol  for TLS prophylaxis  FOLLOW-UP: RTC in 3 weeks for labs and follow up prior to Cycle 2, Day 1.    All of the patient's questions were answered with apparent satisfaction. The patient knows to call the clinic with any problems, questions or concerns.   I have spent a total of 30 minutes minutes of face-to-face and non-face-to-face time, preparing to see the patient, obtaining and/or reviewing separately obtained history, performing a medically appropriate examination, counseling and educating the patient, ordering medications/tests/procedures, referring and communicating with other health care professionals, documenting clinical information in the electronic health record, independently interpreting results and communicating results to the patient, and  care coordination.   Johnston Police PA-C Dept of Hematology and Oncology [facility] Phone: [phone]

## 2023-12-14 ENCOUNTER — Ambulatory Visit (HOSPITAL_COMMUNITY)
Admission: RE | Admit: 2023-12-14 | Discharge: 2023-12-14 | Disposition: A | Discharge status: Home / Self Care | Source: Ambulatory Visit | Attending: Internal Medicine | Admitting: Internal Medicine

## 2023-12-14 DIAGNOSIS — R768 Other specified abnormal immunological findings in serum: Secondary | ICD-10-CM | POA: Diagnosis present

## 2023-12-14 DIAGNOSIS — J849 Interstitial pulmonary disease, unspecified: Secondary | ICD-10-CM | POA: Diagnosis present

## 2023-12-14 DIAGNOSIS — D8989 Other specified disorders involving the immune mechanism, not elsewhere classified: Secondary | ICD-10-CM | POA: Insufficient documentation

## 2023-12-15 ENCOUNTER — Inpatient Hospital Stay

## 2023-12-15 VITALS — BP 132/63 | HR 84 | Temp 98.4°F | Resp 15 | Wt 107.7 lb

## 2023-12-15 DIAGNOSIS — C9110 Chronic lymphocytic leukemia of B-cell type not having achieved remission: Secondary | ICD-10-CM | POA: Diagnosis not present

## 2023-12-15 DIAGNOSIS — C8338 Diffuse large B-cell lymphoma, lymph nodes of multiple sites: Secondary | ICD-10-CM

## 2023-12-15 MED ORDER — SODIUM CHLORIDE 0.9 % IV SOLN: INTRAVENOUS | Status: DC

## 2023-12-15 MED ORDER — DEXAMETHASONE SODIUM PHOSPHATE 10 MG/ML IJ SOLN
10.0000 mg | Freq: Once | INTRAMUSCULAR | Status: AC
  Administered 2023-12-15: 10 mg via INTRAVENOUS
  Filled 2023-12-15: qty 1

## 2023-12-15 MED ORDER — PALONOSETRON HCL INJECTION 0.25 MG/5ML
0.2500 mg | Freq: Once | INTRAVENOUS | Status: AC
  Administered 2023-12-15: 0.25 mg via INTRAVENOUS
  Filled 2023-12-15: qty 5

## 2023-12-15 MED ORDER — SODIUM CHLORIDE 0.9 % IV SOLN
375.0000 mg/m2 | Freq: Once | INTRAVENOUS | Status: AC
  Administered 2023-12-15: 500 mg via INTRAVENOUS
  Filled 2023-12-15: qty 50

## 2023-12-15 MED ORDER — SODIUM CHLORIDE 0.9 % IV SOLN
1.4000 mg/kg | Freq: Once | INTRAVENOUS | Status: AC
  Administered 2023-12-15: 70 mg via INTRAVENOUS
  Filled 2023-12-15: qty 3.5

## 2023-12-15 MED ORDER — ACETAMINOPHEN 325 MG PO TABS
650.0000 mg | ORAL_TABLET | Freq: Once | ORAL | Status: AC
  Administered 2023-12-15: 650 mg via ORAL
  Filled 2023-12-15: qty 2

## 2023-12-15 MED ORDER — DIPHENHYDRAMINE HCL 25 MG PO CAPS
50.0000 mg | ORAL_CAPSULE | Freq: Once | ORAL | Status: AC
  Administered 2023-12-15: 50 mg via ORAL
  Filled 2023-12-15: qty 2

## 2023-12-15 NOTE — Patient Instructions (Signed)
 [facility] - A DEPT OF [facility]  Discharge Instructions: Thank you for choosing [facility] to provide your oncology and hematology care.   If you have a lab appointment with the Cancer Center, please go directly to the Cancer Center and check in at the registration area.   Wear comfortable clothing and clothing appropriate for easy access to any Portacath or PICC line.   We strive to give you quality time with your provider. You may need to reschedule your appointment if you arrive late (15 or more minutes).  Arriving late affects you and other patients whose appointments are after yours.  Also, if you miss three or more appointments without notifying the office, you may be dismissed from the clinic at the provider's discretion.      For prescription refill requests, have your pharmacy contact our office and allow 72 hours for refills to be completed.    Today you received the following chemotherapy and/or immunotherapy agents Ruxience , Polivy     To help prevent nausea and vomiting after your treatment, we encourage you to take your nausea medication as directed.  BELOW ARE SYMPTOMS THAT SHOULD BE REPORTED IMMEDIATELY: *FEVER GREATER THAN 100.4 F (38 C) OR HIGHER *CHILLS OR SWEATING *NAUSEA AND VOMITING THAT IS NOT CONTROLLED WITH YOUR NAUSEA MEDICATION *UNUSUAL SHORTNESS OF BREATH *UNUSUAL BRUISING OR BLEEDING *URINARY PROBLEMS (pain or burning when urinating, or frequent urination) *BOWEL PROBLEMS (unusual diarrhea, constipation, pain near the anus) TENDERNESS IN MOUTH AND THROAT WITH OR WITHOUT PRESENCE OF ULCERS (sore throat, sores in mouth, or a toothache) UNUSUAL RASH, SWELLING OR PAIN  UNUSUAL VAGINAL DISCHARGE OR ITCHING   Items with * indicate a potential emergency and should be followed up as soon as possible or go to the Emergency Department if any problems should occur.  Please show the CHEMOTHERAPY ALERT CARD or  IMMUNOTHERAPY ALERT CARD at check-in to the Emergency Department and triage nurse.  Should you have questions after your visit or need to cancel or reschedule your appointment, please contact [facility] - A DEPT OF [facility]  Dept: [phone]  and follow the prompts.  Office hours are 8:00 a.m. to 4:30 p.m. Monday - Friday. Please note that voicemails left after 4:00 p.m. may not be returned until the following business day.  We are closed weekends and major holidays. You have access to a nurse at all times for urgent questions. Please call the main number to the clinic Dept: [phone] and follow the prompts.   For any non-urgent questions, you may also contact your provider using MyChart. We now offer e-Visits for anyone 76 and older to request care online for non-urgent symptoms. For details visit [email or web address].   Also download the MyChart app! Go to the app store, search MyChart, open the app, select [place], and log in with your MyChart username and password.

## 2023-12-18 ENCOUNTER — Telehealth: Payer: Self-pay

## 2023-12-18 NOTE — Telephone Encounter (Signed)
LM for patient that this nurse was calling to see how they were doing after their treatment. Please call back to Dr. Kale's nurse at [phone] if they have any questions or concerns regarding the treatment. 

## 2023-12-18 NOTE — Telephone Encounter (Signed)
-----   Message from Nurse Selinda E sent at 12/15/2023  2:20 PM EDT ----- Regarding: first time treatment- [name]  Jacqueline Hancock received Polivy  for the first time today. Went well with no issue!!!

## 2023-12-19 ENCOUNTER — Ambulatory Visit
Admission: RE | Admit: 2023-12-19 | Discharge: 2023-12-19 | Disposition: A | Discharge status: Home / Self Care | Source: Ambulatory Visit | Attending: Radiology | Admitting: Radiology

## 2023-12-19 ENCOUNTER — Ambulatory Visit (HOSPITAL_COMMUNITY)
Admission: RE | Admit: 2023-12-19 | Discharge: 2023-12-19 | Disposition: A | Discharge status: Home / Self Care | Source: Ambulatory Visit | Attending: Hematology | Admitting: Hematology

## 2023-12-19 ENCOUNTER — Encounter: Payer: Self-pay | Admitting: Radiology

## 2023-12-19 VITALS — BP 125/62 | HR 84 | Temp 97.9°F | Resp 20 | Ht 59.0 in | Wt 114.0 lb

## 2023-12-19 DIAGNOSIS — C8338 Diffuse large B-cell lymphoma, lymph nodes of multiple sites: Secondary | ICD-10-CM | POA: Insufficient documentation

## 2023-12-19 HISTORY — DX: Personal history of irradiation: Z92.3

## 2023-12-19 MED ORDER — IOHEXOL 9 MG/ML PO SOLN
1000.0000 mL | ORAL | Status: AC
  Administered 2023-12-19: 1000 mL via ORAL

## 2023-12-19 NOTE — Progress Notes (Signed)
 Radiation Oncology         [phone]) [phone] ________________________________  Name: Jacqueline Hancock MRN: [id number]  Date: 12/19/2023  DOB: 1940/12/14  Follow-Up Visit Note  Outpatient  CC: Ngetich, Roxan BROCKS, NP  Ngetich, Roxan BROCKS, NP  Diagnosis and Prior Radiotherapy:    ICD-10-CM   1. 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  She completed her treatment on 11/21/2023.  In the interim, she followed-up with Dr. Onesimo.  She completed cycle 1 of Rituximab  and Polatuzumab on 12/15/2023.   Patient reports to be doing well overall today.  She denies any early satiety and is pleased with the amount of food that she has been able to eat.  She denies any nausea, abdominal pain, diarrhea, or constipation.  She has regular bowel movements.  She denies any fatigue.   ALLERGIES:  is allergic to peanut (diagnostic) and nexium [esomeprazole magnesium ].  Meds: Current Outpatient Medications  Medication Sig Dispense Refill   acyclovir  (ZOVIRAX ) 400 MG tablet Take 1 tablet (400 mg total) by mouth 2 (two) times daily. 60 tablet 5   allopurinol  (ZYLOPRIM ) 300 MG tablet Take 0.5 tablets (150 mg total) by mouth daily. 30 tablet 1   aspirin  81 MG chewable tablet Chew 1 tablet (81 mg total) by mouth daily. 90 tablet 0   dexamethasone  (DECADRON ) 4 MG tablet Take 2 tablets (8 mg total) by mouth daily.  Start the day after bendamustine chemotherapy for 2 days. Take with food. 30 tablet 1   empagliflozin  (JARDIANCE ) 10 MG TABS tablet Take 1 tablet (10 mg total) by mouth daily before breakfast. 30 tablet 5   entecavir  (BARACLUDE ) 0.5 MG tablet Take 1 tablet (0.5 mg total) by mouth every 3 (three) days. (Patient not taking: Reported on 12/13/2023) 30 tablet 2   lidocaine -prilocaine  (EMLA ) cream Apply to affected area once (Patient not taking: Reported on 12/13/2023) 30 g 3   magnesium  oxide (MAG-OX) 400 (240 Mg) MG tablet Take 1 tablet (400 mg total) by mouth daily. 30 tablet 0   ondansetron  (ZOFRAN ) 8 MG tablet Take 8 mg by mouth every 8 (eight) hours as needed for nausea or vomiting.     ondansetron  (ZOFRAN ) 8 MG tablet Take 1 tablet (8 mg total) by mouth every 8 (eight) hours as needed for nausea or vomiting. Start on the third day after chemotherapy. 30 tablet 1   pantoprazole  (PROTONIX ) 40 MG tablet Take 1 tablet (40 mg total) by mouth 2 (two) times daily for 56 days, THEN 1 tablet (40 mg total) daily. (Patient taking differently: Take 1 tablet (40mg ) by mouth twice daily.) 142 tablet 0   polyethylene glycol (MIRALAX  / GLYCOLAX ) 17 g packet Take 17 g by mouth daily as needed for mild constipation or moderate constipation. Also available OTC 30 each 0   potassium chloride  SA (KLOR-CON  M) 20 MEQ tablet Take 1 tablet (20 mEq total) by mouth daily. Take twice  a day for 3 days then once daily 60 tablet 0   prochlorperazine  (COMPAZINE ) 10 MG tablet Take 1 tablet (10 mg total) by mouth every 6 (six) hours as needed for nausea or vomiting. 30 tablet 1   torsemide  (DEMADEX ) 20 MG tablet TAKE 1 TABLET BY MOUTH EVERY DAY AS NEEDED FOR LEG EDEMA OR ABRUPT WEIGHT GAIN 90 tablet 1   traMADol  (ULTRAM ) 50 MG tablet Take 1 tablet (50 mg total) by mouth every 8 (eight) hours as needed for moderate pain (pain score 4-6) or severe pain (pain score 7-10) (pain). 20 tablet 0   No current facility-administered medications  for this encounter.    Physical Findings: The patient is in no acute distress. Patient is alert and oriented.  height is 4' 11 (1.499 m) and weight is 114 lb (51.7 kg). Her temperature is 97.9 F (36.6 C). Her blood pressure is 125/62 and her pulse is 84. Her respiration is 20 and oxygen saturation is 100%. .    General: Alert and oriented, in no acute distress HEENT: Head is normocephalic. Extraocular movements are intact.  Neck: Neck is supple, no palpable cervical or supraclavicular lymphadenopathy. Heart: Regular in rate and rhythm with no murmurs, rubs, or gallops. Chest: Clear to auscultation bilaterally, with no rhonchi, wheezes, or rales. Abdomen: Soft, nontender, nondistended, with no rigidity or guarding. Extremities: No cyanosis or edema. Lymphatics: see Neck Exam Skin: No concerning lesions. Musculoskeletal: symmetric strength and muscle tone throughout. Neurologic: Cranial nerves II through XII are grossly intact. No obvious focalities. Speech is fluent. Coordination is intact. Psychiatric: Judgment and insight are intact. Affect is appropriate.   Lab Findings: Lab Results  Component Value Date   WBC 5.0 12/13/2023   HGB 8.5 (L) 12/13/2023   HCT 26.5 (L) 12/13/2023   MCV 87.2 12/13/2023   PLT 157 12/13/2023    Radiographic Findings: No results found.  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  The abdominal pain and early satiety have fortunately resolved since finishing her treatment. She will continue on immunotherapy under the care of Dr. Onesimo.  She is scheduled to see medical oncology next on 01/05/2024.  Radiation follow-up as needed.  We appreciate the opportunity to take part in this patient's care.  She was encouraged to call with any questions or concerns.  On date of service, in total, I spent 20 minutes on this  encounter. Patient was seen in person.  _____________________________________    Leeroy Due, PA-C

## 2023-12-19 NOTE — Progress Notes (Signed)
  Radiation Oncology         [phone] ________________________________  Name: Jacqueline Hancock MRN: [id number]  Date of Service: 12/19/2023  DOB: [birth year]     Diagnosis: Unspecified B-cell lymphoma, intra-abdominal lymph nodes Last Treatment Date: 2023-11-21   Symptoms of fatigue have improved since completing therapy.  Symptoms of skin changes have not improved since completing therapy. She reports that her skin is dry.  Symptoms of nausea or vomiting have not improved since completing therapy.  Weight changes: 12/19/23  114.0 lb 12/15/23   107 lb 11 oz 11/29/23    111lb   3.2 oz  Appetite: Good BP 125/62 (BP Location: Left Arm, Patient Position: Sitting, Cuff Size: Normal)   Pulse 84   Temp 97.9 F (36.6 C)   Resp 20   Ht 4' 11 (1.499 m)   Wt 114 lb (51.7 kg)   SpO2 100%   BMI 23.03 kg/m

## 2023-12-22 ENCOUNTER — Encounter: Payer: Self-pay | Admitting: Radiology

## 2023-12-26 ENCOUNTER — Other Ambulatory Visit (HOSPITAL_COMMUNITY): Payer: Self-pay

## 2024-01-04 NOTE — Progress Notes (Signed)
 HEMATOLOGY/ONCOLOGY CLINIC NOTE  Date of Service: 01/05/24  Patient Care Team: Ngetich, Roxan BROCKS, NP as PCP - General (Family Medicine) Kate Lonni CROME, MD as PCP - Cardiology (Cardiology) Arlana Arnt, MD as Consulting Physician (Otolaryngology) Luis Purchase, MD as Consulting Physician (Gastroenterology) Raeanne, Shanda SQUIBB, MD (Inactive) as Consulting Physician (Obstetrics and Gynecology) Roz Anes, MD as Consulting Physician (Ophthalmology) Quinn Odor, [facility] (Inactive) as Pharmacist (Pharmacist) Onesimo Emaline Brink, MD as Consulting Physician (Hematology)  CHIEF COMPLAINTS/PURPOSE OF CONSULTATION:  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  She was last seen by Dr. Onesimo on 12/13/2023.  She is accompanied by her son for this visit.  Jacqueline Hancock reports having new onset diffuse pruritus with peeling of skin in the right intertriginous area. In addition, she notes a rash on her forearms and back. She noticed swelling and peeling of skin of her left fingers. She denies nausea, vomiting or bowel habit changes. She denies easy bruising or signs of bleeding. She denies fevers, chills, sweats, shortness of breath, chest pain or cough.   MEDICAL HISTORY:  Past Medical History:  Diagnosis Date   CLL (chronic lymphocytic leukemia) (HCC)    Congestive heart failure (CHF) (HCC) 09/2023   Dyspnea 2021   with exersion    Early cataracts, bilateral    Elevated hemoglobin A1c    Fibroids    GERD (gastroesophageal reflux disease)    Hyperlipidemia    Hypertension    Lymphoma (HCC)    Osteopenia    Personal history of radiation therapy    11/15/2023 - 11/21/2023 Abdomen- Dr. Lauraine Golden    SURGICAL HISTORY: Past Surgical History:  Procedure Laterality Date   ABDOMINAL HYSTERECTOMY     APPENDECTOMY      BALLOON DILATION N/A 08/31/2023   Procedure: BALLOON DILATION;  Surgeon: Charlanne Groom, MD;  Location: [facility];  Service: Gastroenterology;  Laterality: N/A;   CATARACT EXTRACTION, BILATERAL Bilateral 09/2017   Dr. 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   TONSILLECTOMY AND ADENOIDECTOMY      SOCIAL HISTORY: Social History   Socioeconomic History   Marital status: Widowed    Spouse name: Not on file   Number of children: 3   Years of education: Not on file   Highest education level: Not on file  Occupational History   Occupation: retired  Tobacco Use   Smoking status: Never    Passive exposure: Past   Smokeless tobacco: Never  Vaping Use   Vaping status: Never Used  Substance and Sexual Activity   Alcohol use: No   Drug use: Never   Sexual activity: Not on file  Other Topics Concern   Not on file  Social History Narrative   Not on file   Social Drivers of Health   Financial Resource Strain: Low Risk  (10/11/2023)   Overall Financial Resource Strain (CARDIA)    Difficulty of Paying Living Expenses: Not very hard  Food Insecurity: No Food Insecurity (11/13/2023)   Hunger Vital Sign    Worried About Running Out of Food in the Last Year: Never true    Ran Out  of Food in the Last Year: Never true  Transportation Needs: No Transportation Needs (11/13/2023)   PRAPARE - Administrator, Civil Service (Medical): No    Lack of Transportation (Non-Medical): No  Physical Activity: Not on file  Stress: Not on file  Social Connections: Socially Isolated (11/13/2023)   Social Connection and Isolation Panel    Frequency of Communication with Friends and Family: More than three times a week    Frequency of Social  Gatherings with Friends and Family: Never    Attends Religious Services: Never    Database administrator or Organizations: No    Attends Banker Meetings: Never    Marital Status: Widowed  Intimate Partner Violence: Not At Risk (11/13/2023)   Humiliation, Afraid, Rape, and Kick questionnaire    Fear of Current or Ex-Partner: No    Emotionally Abused: No    Physically Abused: No    Sexually Abused: No    FAMILY HISTORY: Family History  Problem Relation Age of Onset   Leukemia Mother    Hypertension Father    Diabetes Sister    Heart disease Brother    Diabetes Brother    Diabetes Brother    Hypertension Son    Heart disease Son     ALLERGIES:  is allergic to peanut (diagnostic) and nexium [esomeprazole magnesium ].  MEDICATIONS:  Current Outpatient Medications  Medication Sig Dispense Refill   acyclovir  (ZOVIRAX ) 400 MG tablet Take 1 tablet (400 mg total) by mouth 2 (two) times daily. 60 tablet 5   allopurinol  (ZYLOPRIM ) 300 MG tablet Take 0.5 tablets (150 mg total) by mouth daily. 30 tablet 1   aspirin  81 MG chewable tablet Chew 1 tablet (81 mg total) by mouth daily. 90 tablet 0   dexamethasone  (DECADRON ) 4 MG tablet Take 2 tablets (8 mg total) by mouth daily. Start the day after bendamustine chemotherapy for 2 days. Take with food. 30 tablet 1   doxycycline (VIBRA-TABS) 100 MG tablet Take 1 tablet (100 mg total) by mouth 2 (two) times daily for 7 days. 14 tablet 0   empagliflozin  (JARDIANCE ) 10 MG TABS tablet Take 1 tablet (10 mg total) by mouth daily before breakfast. 30 tablet 5   fluconazole  (DIFLUCAN ) 100 MG tablet Take 1 tablet (100 mg total) by mouth daily for 7 days. 7 tablet 0   lidocaine -prilocaine  (EMLA ) cream Apply to affected area once 30 g 3   magnesium  oxide (MAG-OX) 400 (240 Mg) MG tablet Take 1 tablet (400 mg total) by mouth daily. (Patient taking differently: Take 400 mg by mouth daily. PRN) 30 tablet 0   ondansetron  (ZOFRAN ) 8 MG tablet Take 8 mg  by mouth every 8 (eight) hours as needed for nausea or vomiting.     ondansetron  (ZOFRAN ) 8 MG tablet Take 1 tablet (8 mg total) by mouth every 8 (eight) hours as needed for nausea or vomiting. Start on the third day after chemotherapy. 30 tablet 1   pantoprazole  (PROTONIX ) 40 MG tablet Take 1 tablet (40 mg total) by mouth 2 (two) times daily for 56 days, THEN 1 tablet (40 mg total) daily. (Patient taking differently: Take 1 tablet (40mg ) by mouth twice daily.) 142 tablet 0   polyethylene glycol (MIRALAX  / GLYCOLAX ) 17 g packet Take 17 g by mouth daily as needed for mild constipation or moderate constipation. Also available OTC 30 each 0   prochlorperazine  (COMPAZINE ) 10 MG tablet Take 1 tablet (10 mg total) by mouth every 6 (six)  hours as needed for nausea or vomiting. (Patient not taking: Reported on 01/08/2024) 30 tablet 1   torsemide  (DEMADEX ) 20 MG tablet TAKE 1 TABLET BY MOUTH EVERY DAY AS NEEDED FOR LEG EDEMA OR ABRUPT WEIGHT GAIN 90 tablet 1   traMADol  (ULTRAM ) 50 MG tablet Take 1 tablet (50 mg total) by mouth every 8 (eight) hours as needed for moderate pain (pain score 4-6) or severe pain (pain score 7-10) (pain). 20 tablet 0   entecavir  (BARACLUDE ) 0.5 MG tablet Take 1 tablet (0.5 mg total) by mouth every 3 (three) days. 30 tablet 2   potassium chloride  SA (KLOR-CON  M) 20 MEQ tablet Take 1 tablet (20 mEq total) by mouth daily. Take twice a day for 3 days then once daily 60 tablet 0   No current facility-administered medications for this visit.    REVIEW OF SYSTEMS:    10 Point review of Systems was done is negative except as noted above.   PHYSICAL EXAMINATION: ECOG PERFORMANCE STATUS: 2 - Symptomatic, <50% confined to bed  . Vitals:   01/05/24 0943  BP: (!) 142/63  Pulse: 74  Resp: 14  Temp: 97.8 F (36.6 C)  SpO2: 100%     Filed Weights   01/05/24 0943  Weight: 118 lb 14.4 oz (53.9 kg)    .Body mass index is 24.01 kg/m.   GENERAL:alert, in no acute distress and  comfortable SKIN: Desquamation of right intertriginous area. Healed papular rash on her forearms and back. Paronychia of left 4th finger with edema and mild purulent discharge. EYES: conjunctiva are pink and non-injected, sclera anicteric LUNGS: clear to auscultation b/l with normal respiratory effort HEART: regular rate & rhythm  Extremity: no pedal edema PSYCH: alert & oriented x 3 with fluent speech NEURO: no focal motor/sensory deficits   LABORATORY DATA:  I have reviewed the data as listed  .    Latest Ref Rng & Units 01/05/2024    8:43 AM 12/13/2023    1:09 PM 11/29/2023    9:05 AM  CBC  WBC 4.0 - 10.5 K/uL 2.8  5.0  3.4   Hemoglobin 12.0 - 15.0 g/dL 8.9  8.5  8.7   Hematocrit 36.0 - 46.0 % 27.2  26.5  26.5   Platelets 150 - 400 K/uL 133  157  173    .    Latest Ref Rng & Units 01/05/2024    8:43 AM 12/13/2023    1:09 PM 11/29/2023    9:05 AM  CMP  Glucose 70 - 99 mg/dL 84  884  881   BUN 8 - 23 mg/dL 53  59  41   Creatinine 0.44 - 1.00 mg/dL 7.82  7.98  8.07   Sodium 135 - 145 mmol/L 139  136  138   Potassium 3.5 - 5.1 mmol/L 4.2  4.0  2.9   Chloride 98 - 111 mmol/L 106  103  102   CO2 22 - 32 mmol/L 27  26  28    Calcium 8.9 - 10.3 mg/dL 9.7  9.2  9.2   Total Protein 6.5 - 8.1 g/dL 6.2  6.2  5.4   Total Bilirubin 0.0 - 1.2 mg/dL 0.9  0.6  0.9   Alkaline Phos 38 - 126 U/L 63  119  287   AST 15 - 41 U/L 15  12  11    ALT 0 - 44 U/L 10  8  10     . Lab Results  Component Value Date   LDH 411 (  H) 11/13/2023     PATHOLOGY Surgical Pathology CASE: [id number] PATIENT: Taren Spallone Bone Marrow Report     Clinical History: lymphocytosis, possible lymphoma     DIAGNOSIS:  BONE MARROW, ASPIRATE, CLOT, CORE: -Hypercellular bone marrow with extensive involvement by a B-cell lymphoproliferative disorder -See comment  PERIPHERAL BLOOD: -Macrocytic anemia -Marked lymphocytosis consistent with lymphoproliferative disorder  COMMENT:  There is prominent  involvement by a B-cell lymphoproliferative process associated with cyclin D1 positivity and partial expression of CD5.  The latter is primarily seen by flow cytometry.  The findings favor mantle cell lymphoma but correlation with cytogenetic and FISH studies is recommended.  MICROSCOPIC DESCRIPTION:  PERIPHERAL BLOOD SMEAR: The red blood cells display prominent anisocytosis with macrocytic and normocytic cells.  There is mild to moderate poikilocytosis with teardrop cells, elliptocytes.  There is moderate polychromasia.  The white blood cells are increased in number with lymphocytosis characterized by predominance of small to medium sized lymphoid cells displaying high nuclear cytoplasmic ratio, round to irregular nuclei, coarse chromatin and small to inconspicuous nucleoli. The platelets are normal in number.     RADIOGRAPHIC STUDIES: I have personally reviewed the radiological images as listed and agreed with the findings in the report. CT CHEST ABDOMEN PELVIS WO CONTRAST Result Date: 12/21/2023 CLINICAL DATA:  Hematologic malignancy, assess treatment response, large B-cell lymphoma EXAM: CT CHEST, ABDOMEN AND PELVIS WITHOUT CONTRAST TECHNIQUE: Multidetector CT imaging of the chest, abdomen and pelvis was performed following the standard protocol without IV contrast. RADIATION DOSE REDUCTION: This exam was performed according to the departmental dose-optimization program which includes automated exposure control, adjustment of the mA and/or kV according to patient size and/or use of iterative reconstruction technique. COMPARISON:  PET-CT, 10/19/2023 FINDINGS: CT CHEST FINDINGS Cardiovascular: Right chest port catheter. Normal heart size. No pericardial effusion. Mediastinum/Nodes: No enlarged mediastinal, hilar, or axillary lymph nodes. Thyroid  gland, trachea, and esophagus demonstrate no significant findings. 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 No pleural effusion or pneumothorax. Musculoskeletal: No chest wall abnormality. No acute osseous findings. CT ABDOMEN PELVIS FINDINGS Hepatobiliary: No solid liver abnormality is seen. No gallstones, gallbladder wall thickening, or biliary dilatation. Pancreas: Unremarkable. No pancreatic ductal dilatation or surrounding inflammatory changes. Spleen: Normal in size without significant abnormality. Adrenals/Urinary Tract: Adrenal glands are unremarkable. Diminished size of a large soft tissue mass encasing the right kidney and proximal right ureter, on today's examination measuring 9.5 x 6.5 cm, previously 12.9 x 10.0 cm (series 2, image 62). New, mild left hydronephrosis and proximal hydroureter, no obstructing etiology directly visualized in the distal ureter. Bladder is unremarkable. Stomach/Bowel: Stomach is within normal limits. Appendix appears normal. No evidence of bowel wall thickening, distention, or inflammatory changes. Large burden of stool in the colon. Vascular/Lymphatic: Aortic atherosclerosis. Multiple new bulky lymph nodes, including in the central small bowel mesentery measuring 5.5 x 3.2 cm (series 2, image 71) and in the bilateral pelvic sidewalls and inguinal stations, large right pelvic sidewall node measuring 4.5 x 2.3 cm (series 2, image 94). Reproductive: Hysterectomy. Other: No abdominal wall hernia or abnormality. No ascites. Musculoskeletal: No acute osseous findings. IMPRESSION: 1. Multiple new bulky lymph nodes, including in the central small bowel mesentery, bilateral pelvic sidewalls and inguinal stations, consistent with worsened lymphoma. 2. Diminished size of a large soft tissue mass encasing the right kidney and proximal right ureter. 3. New, mild left hydronephrosis and proximal hydroureter, no  calculus or other obstructing etiology directly visualized in the distal  ureter. 4. See separately reported examination of the chest for discussion of fibrotic interstitial lung disease. Aortic Atherosclerosis (ICD10-I70.0). Electronically Signed   By: Marolyn JONETTA Jaksch M.D.   On: 12/21/2023 07:42   CT Chest High Resolution Result Date: 12/21/2023 CLINICAL DATA:  Diffuse interstitial lung disease positive dsDNA, ENA, additional history large B-cell lymphoma * Tracking Code: BO * EXAM: CT CHEST WITHOUT CONTRAST TECHNIQUE: Multidetector CT imaging of the chest was performed following the standard protocol without intravenous contrast. High resolution imaging of the lungs, as well as inspiratory and expiratory imaging, was performed. RADIATION DOSE REDUCTION: This exam was performed according to the departmental dose-optimization program which includes automated exposure control, adjustment of the mA and/or kV according to patient size and/or use of iterative reconstruction technique. COMPARISON:  PET-CT, 10/19/2023 CT chest, 12/02/2022 FINDINGS: Cardiovascular: Right chest port catheter normal heart size. No pericardial effusion. Mediastinum/Nodes: No enlarged mediastinal, hilar, or axillary lymph nodes. Thyroid  gland, trachea, and esophagus demonstrate no significant findings. Lungs/Pleura: Unchanged mild pulmonary fibrosis in a pattern with apical to basal gradient, featuring a preponderance of irregular ground-glass as well as peribronchovascular and peripheral irregular interstitial opacity and septal thickening. No significant air trapping on expiratory phase imaging. No pleural effusion or pneumothorax. Upper Abdomen: No acute abnormality. Musculoskeletal: No chest wall abnormality. No acute osseous findings. IMPRESSION: 1. Unchanged mild pulmonary fibrosis in a pattern with apical to basal gradient, featuring a preponderance of irregular ground-glass as well as peribronchovascular and peripheral irregular interstitial opacity and septal thickening. No significant air trapping on  expiratory phase imaging. Findings are suggestive of an alternative diagnosis (not UIP) per consensus guidelines. In this clinical setting chronic sequelae of drug toxicity would be a substantial differential consideration, and given report of abnormal titers connective tissue disorder associated interstitial lung disease would also be a significant consideration. Findings are suggestive of an alternative diagnosis (not UIP) per consensus guidelines: Diagnosis of Idiopathic Pulmonary Fibrosis: An Official ATS/ERS/JRS/ALAT Clinical Practice Guideline. Am JINNY Honey Crit Care Med Vol 198, Iss 5, [phone], Dec 03 2016. 2. No evidence of lymphadenopathy or metastatic disease in the chest. Electronically Signed   By: Marolyn JONETTA Jaksch M.D.   On: 12/21/2023 07:36     ASSESSMENT & PLAN:  Jacqueline Hancock is a 83 y.o. woman with:   1.  Chronic Lymphocytic Leukemia - CD20 positive, CD5 positive --Presented with hemolytic anemia and splenomegaly in December 2021. Bone marrow biopsy confirmed  --Started prednisone  60 mg daily in January 2022 to treat her hemolytic anemia.  This was tapered off after complete hematological response.  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 --Biopsy of large retroperitoneal mass from 08/04/2023 showed signs of CLL as well as areas that have histologically transformed from CLL to large B cell lymphoma  --Recommended R-mini CHOP, started on 08/23/2023 --PET/CT scan from 10/19/2023 did not show as much response as we would expect. We discussed that there is partial response in the main tumor surrounding the kidney in the abdm. There is not much disease outside of the abdomen. The main mass in the right abdm is less active, though there are a couple of new areas of disease which are not large but are more active.   --Recommend to switch to  Rituximab  and Polatuzumab, tentative start on 12/15/2023.  --  Cycle 1, Day 1 of Rituximab  and Polatuzumab started 12/15/2023.  3. Hepatitis B chronic active disease --Continue to follow up with infectious disease.   PLAN: --Due for Cycle 2, Day 1 today. --Labs from today WBC 2.8, Hgb 8.9, MCV 91.0, Plt 133K, ANC 700, creatinine stable at 2.17, LFTs normal --Proceed with treatment today without any dose modifications.  --Will add GCSF to her treatment on Mondays moving forward to minimize neutropenia.  --Continue with acyclovir  for shingles prophylaxis.  --Recommend to hold allopurinol  due to concern for drug induced rash --Will send doxycycline to help with paronychia of left 4th finger --Will send fluconazole  for suspected fungal rash in right intertriginous area  FOLLOW-UP: RTC in 3 weeks for labs and follow up prior to Cycle 3, Day 1.    All of the patient's questions were answered with apparent satisfaction. The patient knows to call the clinic with any problems, questions or concerns.   I have spent a total of 30 minutes minutes of face-to-face and non-face-to-face time, preparing to see the patient, obtaining and/or reviewing separately obtained history, performing a medically appropriate examination, counseling and educating the patient, ordering medications/tests/procedures, referring and communicating with other health care professionals, documenting clinical information in the electronic health record, independently interpreting results and communicating results to the patient, and care coordination.   Johnston Police PA-C Dept of Hematology and Oncology [facility] Phone: [phone]

## 2024-01-05 ENCOUNTER — Inpatient Hospital Stay

## 2024-01-05 ENCOUNTER — Inpatient Hospital Stay (HOSPITAL_BASED_OUTPATIENT_CLINIC_OR_DEPARTMENT_OTHER): Admitting: Physician Assistant

## 2024-01-05 ENCOUNTER — Inpatient Hospital Stay: Attending: Hematology

## 2024-01-05 VITALS — BP 137/66 | HR 79 | Temp 98.1°F | Resp 17

## 2024-01-05 VITALS — BP 142/63 | HR 74 | Temp 97.8°F | Resp 14 | Wt 118.9 lb

## 2024-01-05 DIAGNOSIS — C8333 Diffuse large B-cell lymphoma, intra-abdominal lymph nodes: Secondary | ICD-10-CM | POA: Insufficient documentation

## 2024-01-05 DIAGNOSIS — R21 Rash and other nonspecific skin eruption: Secondary | ICD-10-CM | POA: Diagnosis not present

## 2024-01-05 DIAGNOSIS — L299 Pruritus, unspecified: Secondary | ICD-10-CM | POA: Diagnosis not present

## 2024-01-05 DIAGNOSIS — Z923 Personal history of irradiation: Secondary | ICD-10-CM | POA: Insufficient documentation

## 2024-01-05 DIAGNOSIS — Z5111 Encounter for antineoplastic chemotherapy: Secondary | ICD-10-CM

## 2024-01-05 DIAGNOSIS — N1330 Unspecified hydronephrosis: Secondary | ICD-10-CM | POA: Diagnosis not present

## 2024-01-05 DIAGNOSIS — Z7952 Long term (current) use of systemic steroids: Secondary | ICD-10-CM | POA: Insufficient documentation

## 2024-01-05 DIAGNOSIS — Z5189 Encounter for other specified aftercare: Secondary | ICD-10-CM | POA: Insufficient documentation

## 2024-01-05 DIAGNOSIS — L03012 Cellulitis of left finger: Secondary | ICD-10-CM

## 2024-01-05 DIAGNOSIS — Z79624 Long term (current) use of inhibitors of nucleotide synthesis: Secondary | ICD-10-CM | POA: Diagnosis not present

## 2024-01-05 DIAGNOSIS — M8580 Other specified disorders of bone density and structure, unspecified site: Secondary | ICD-10-CM | POA: Diagnosis not present

## 2024-01-05 DIAGNOSIS — B181 Chronic viral hepatitis B without delta-agent: Secondary | ICD-10-CM | POA: Insufficient documentation

## 2024-01-05 DIAGNOSIS — C9110 Chronic lymphocytic leukemia of B-cell type not having achieved remission: Secondary | ICD-10-CM | POA: Diagnosis present

## 2024-01-05 DIAGNOSIS — K219 Gastro-esophageal reflux disease without esophagitis: Secondary | ICD-10-CM | POA: Insufficient documentation

## 2024-01-05 DIAGNOSIS — I509 Heart failure, unspecified: Secondary | ICD-10-CM | POA: Insufficient documentation

## 2024-01-05 DIAGNOSIS — I700 Atherosclerosis of aorta: Secondary | ICD-10-CM | POA: Insufficient documentation

## 2024-01-05 DIAGNOSIS — Z79899 Other long term (current) drug therapy: Secondary | ICD-10-CM | POA: Insufficient documentation

## 2024-01-05 DIAGNOSIS — Z806 Family history of leukemia: Secondary | ICD-10-CM | POA: Diagnosis not present

## 2024-01-05 DIAGNOSIS — D702 Other drug-induced agranulocytosis: Secondary | ICD-10-CM

## 2024-01-05 DIAGNOSIS — Z7984 Long term (current) use of oral hypoglycemic drugs: Secondary | ICD-10-CM | POA: Diagnosis not present

## 2024-01-05 DIAGNOSIS — C8338 Diffuse large B-cell lymphoma, lymph nodes of multiple sites: Secondary | ICD-10-CM | POA: Diagnosis not present

## 2024-01-05 DIAGNOSIS — Z5112 Encounter for antineoplastic immunotherapy: Secondary | ICD-10-CM | POA: Insufficient documentation

## 2024-01-05 DIAGNOSIS — I10 Essential (primary) hypertension: Secondary | ICD-10-CM | POA: Diagnosis not present

## 2024-01-05 DIAGNOSIS — E785 Hyperlipidemia, unspecified: Secondary | ICD-10-CM | POA: Diagnosis not present

## 2024-01-05 DIAGNOSIS — B49 Unspecified mycosis: Secondary | ICD-10-CM

## 2024-01-05 DIAGNOSIS — Z7982 Long term (current) use of aspirin: Secondary | ICD-10-CM | POA: Insufficient documentation

## 2024-01-05 LAB — CMP (CANCER CENTER ONLY)
ALT: 10 U/L (ref 0–44)
AST: 15 U/L (ref 15–41)
Albumin: 3.8 g/dL (ref 3.5–5.0)
Alkaline Phosphatase: 63 U/L (ref 38–126)
Anion gap: 6 (ref 5–15)
BUN: 53 mg/dL — ABNORMAL HIGH (ref 8–23)
CO2: 27 mmol/L (ref 22–32)
Calcium: 9.7 mg/dL (ref 8.9–10.3)
Chloride: 106 mmol/L (ref 98–111)
Creatinine: 2.17 mg/dL — ABNORMAL HIGH (ref 0.44–1.00)
GFR, Estimated: 22 mL/min — ABNORMAL LOW (ref >60)
Glucose, Bld: 84 mg/dL (ref 70–99)
Potassium: 4.2 mmol/L (ref 3.5–5.1)
Sodium: 139 mmol/L (ref 135–145)
Total Bilirubin: 0.9 mg/dL (ref 0.0–1.2)
Total Protein: 6.2 g/dL — ABNORMAL LOW (ref 6.5–8.1)

## 2024-01-05 LAB — CBC WITH DIFFERENTIAL (CANCER CENTER ONLY)
Abs Immature Granulocytes: 0 K/uL (ref 0.00–0.07)
Basophils Absolute: 0 K/uL (ref 0.0–0.1)
Basophils Relative: 0 %
Eosinophils Absolute: 0.1 K/uL (ref 0.0–0.5)
Eosinophils Relative: 5 %
HCT: 27.2 % — ABNORMAL LOW (ref 36.0–46.0)
Hemoglobin: 8.9 g/dL — ABNORMAL LOW (ref 12.0–15.0)
Immature Granulocytes: 0 %
Lymphocytes Relative: 42 %
Lymphs Abs: 1.2 K/uL (ref 0.7–4.0)
MCH: 29.8 pg (ref 26.0–34.0)
MCHC: 32.7 g/dL (ref 30.0–36.0)
MCV: 91 fL (ref 80.0–100.0)
Monocytes Absolute: 0.7 K/uL (ref 0.1–1.0)
Monocytes Relative: 26 %
Neutro Abs: 0.7 K/uL — ABNORMAL LOW (ref 1.7–7.7)
Neutrophils Relative %: 27 %
Platelet Count: 133 K/uL — ABNORMAL LOW (ref 150–400)
RBC: 2.99 MIL/uL — ABNORMAL LOW (ref 3.87–5.11)
RDW: 20.5 % — ABNORMAL HIGH (ref 11.5–15.5)
WBC Count: 2.8 K/uL — ABNORMAL LOW (ref 4.0–10.5)
nRBC: 0 % (ref 0.0–0.2)

## 2024-01-05 LAB — SAMPLE TO BLOOD BANK

## 2024-01-05 MED ORDER — SODIUM CHLORIDE 0.9 % IV SOLN: INTRAVENOUS | Status: DC

## 2024-01-05 MED ORDER — SODIUM CHLORIDE 0.9 % IV SOLN
1.4000 mg/kg | Freq: Once | INTRAVENOUS | Status: AC
  Administered 2024-01-05: 70 mg via INTRAVENOUS
  Filled 2024-01-05: qty 3.5

## 2024-01-05 MED ORDER — DEXAMETHASONE SODIUM PHOSPHATE 10 MG/ML IJ SOLN
10.0000 mg | Freq: Once | INTRAMUSCULAR | Status: AC
  Administered 2024-01-05: 10 mg via INTRAVENOUS
  Filled 2024-01-05: qty 1

## 2024-01-05 MED ORDER — SODIUM CHLORIDE 0.9 % IV SOLN
375.0000 mg/m2 | Freq: Once | INTRAVENOUS | Status: AC
  Administered 2024-01-05: 500 mg via INTRAVENOUS
  Filled 2024-01-05: qty 50

## 2024-01-05 MED ORDER — ACETAMINOPHEN 325 MG PO TABS
650.0000 mg | ORAL_TABLET | Freq: Once | ORAL | Status: AC
  Administered 2024-01-05: 650 mg via ORAL
  Filled 2024-01-05: qty 2

## 2024-01-05 MED ORDER — DIPHENHYDRAMINE HCL 25 MG PO CAPS
50.0000 mg | ORAL_CAPSULE | Freq: Once | ORAL | Status: AC
  Administered 2024-01-05: 50 mg via ORAL
  Filled 2024-01-05: qty 2

## 2024-01-05 MED ORDER — PALONOSETRON HCL INJECTION 0.25 MG/5ML
0.2500 mg | Freq: Once | INTRAVENOUS | Status: AC
  Administered 2024-01-05: 0.25 mg via INTRAVENOUS
  Filled 2024-01-05: qty 5

## 2024-01-05 NOTE — Patient Instructions (Signed)
 [facility] - A DEPT OF [facility]  Discharge Instructions: Thank you for choosing [facility] to provide your oncology and hematology care.   If you have a lab appointment with the Cancer Center, please go directly to the Cancer Center and check in at the registration area.   Wear comfortable clothing and clothing appropriate for easy access to any Portacath or PICC line.   We strive to give you quality time with your provider. You may need to reschedule your appointment if you arrive late (15 or more minutes).  Arriving late affects you and other patients whose appointments are after yours.  Also, if you miss three or more appointments without notifying the office, you may be dismissed from the clinic at the provider's discretion.      For prescription refill requests, have your pharmacy contact our office and allow 72 hours for refills to be completed.    Today you received the following chemotherapy and/or immunotherapy agents Ruxience , Polivy     To help prevent nausea and vomiting after your treatment, we encourage you to take your nausea medication as directed.  BELOW ARE SYMPTOMS THAT SHOULD BE REPORTED IMMEDIATELY: *FEVER GREATER THAN 100.4 F (38 C) OR HIGHER *CHILLS OR SWEATING *NAUSEA AND VOMITING THAT IS NOT CONTROLLED WITH YOUR NAUSEA MEDICATION *UNUSUAL SHORTNESS OF BREATH *UNUSUAL BRUISING OR BLEEDING *URINARY PROBLEMS (pain or burning when urinating, or frequent urination) *BOWEL PROBLEMS (unusual diarrhea, constipation, pain near the anus) TENDERNESS IN MOUTH AND THROAT WITH OR WITHOUT PRESENCE OF ULCERS (sore throat, sores in mouth, or a toothache) UNUSUAL RASH, SWELLING OR PAIN  UNUSUAL VAGINAL DISCHARGE OR ITCHING   Items with * indicate a potential emergency and should be followed up as soon as possible or go to the Emergency Department if any problems should occur.  Please show the CHEMOTHERAPY ALERT CARD or  IMMUNOTHERAPY ALERT CARD at check-in to the Emergency Department and triage nurse.  Should you have questions after your visit or need to cancel or reschedule your appointment, please contact [facility] - A DEPT OF [facility]  Dept: [phone]  and follow the prompts.  Office hours are 8:00 a.m. to 4:30 p.m. Monday - Friday. Please note that voicemails left after 4:00 p.m. may not be returned until the following business day.  We are closed weekends and major holidays. You have access to a nurse at all times for urgent questions. Please call the main number to the clinic Dept: [phone] and follow the prompts.   For any non-urgent questions, you may also contact your provider using MyChart. We now offer e-Visits for anyone 76 and older to request care online for non-urgent symptoms. For details visit [email or web address].   Also download the MyChart app! Go to the app store, search MyChart, open the app, select [place], and log in with your MyChart username and password.

## 2024-01-05 NOTE — Progress Notes (Signed)
 Pt observed for 30 minutes post Polivy  infusion. Pt tolerated Tx well w/out incident. VSS at discharge.  Ambulatory to lobby.

## 2024-01-05 NOTE — Progress Notes (Addendum)
 Ok to proceed with ANC=0.7 and Scr=2.17 today per Johnston Police, PA-C. Pt will receive Udenyca  on Monday.  Makih Stefanko, PharmD, MBA

## 2024-01-06 MED ORDER — FLUCONAZOLE 100 MG PO TABS: 100.0000 mg | ORAL_TABLET | Freq: Every day | ORAL | 0 refills | Status: AC

## 2024-01-06 MED ORDER — DOXYCYCLINE HYCLATE 100 MG PO TABS: 100.0000 mg | ORAL_TABLET | Freq: Two times a day (BID) | ORAL | 0 refills | Status: AC

## 2024-01-08 ENCOUNTER — Encounter: Payer: Self-pay | Admitting: Internal Medicine

## 2024-01-08 ENCOUNTER — Encounter: Payer: Self-pay | Admitting: Hematology

## 2024-01-08 ENCOUNTER — Ambulatory Visit: Admitting: Internal Medicine

## 2024-01-08 ENCOUNTER — Other Ambulatory Visit: Payer: Self-pay

## 2024-01-08 VITALS — BP 133/69 | HR 76 | Temp 98.0°F | Resp 18 | Ht 61.0 in | Wt 120.0 lb

## 2024-01-08 DIAGNOSIS — R21 Rash and other nonspecific skin eruption: Secondary | ICD-10-CM

## 2024-01-08 DIAGNOSIS — Z79899 Other long term (current) drug therapy: Secondary | ICD-10-CM

## 2024-01-08 DIAGNOSIS — B181 Chronic viral hepatitis B without delta-agent: Secondary | ICD-10-CM

## 2024-01-08 NOTE — Progress Notes (Signed)
 RFV: chronic hepatitis b  Patient ID: Jacqueline Hancock, female   DOB: Dec 14, 1940, 83 y.o.   MRN: [id number]  HPI  Jacqueline Hancock is an 83yo F who B-cell lymphoproliferative disorder who was hospitalized in early July for congestive heart failure, pulmonary edema requiring intubation/extubation. She was found to have active chronic hepatitis B infection at that time. In anticipation to being started on chemotherapy, decision was made to start on entecavir  for reduced side effect profile, renally dosed. She has been discharged from the hospital cared by family, remains wheelchair dependent due to decrease mobility, trying to increase nutritional intake.   No issues with entecavir . Follows with dr onesimo  Has been itching in interginous area - started on antifungal cream  Outpatient Encounter Medications as of 01/08/2024  Medication Sig   acyclovir  (ZOVIRAX ) 400 MG tablet Take 1 tablet (400 mg total) by mouth 2 (two) times daily.   allopurinol  (ZYLOPRIM ) 300 MG tablet Take 0.5 tablets (150 mg total) by mouth daily.   aspirin  81 MG chewable tablet Chew 1 tablet (81 mg total) by mouth daily.   dexamethasone  (DECADRON ) 4 MG tablet Take 2 tablets (8 mg total) by mouth daily. Start the day after bendamustine chemotherapy for 2 days. Take with food.   doxycycline (VIBRA-TABS) 100 MG tablet Take 1 tablet (100 mg total) by mouth 2 (two) times daily for 7 days.   empagliflozin  (JARDIANCE ) 10 MG TABS tablet Take 1 tablet (10 mg total) by mouth daily before breakfast.   entecavir  (BARACLUDE ) 0.5 MG tablet Take 1 tablet (0.5 mg total) by mouth every 3 (three) days.   fluconazole  (DIFLUCAN ) 100 MG tablet Take 1 tablet (100 mg total) by mouth daily for 7 days.   lidocaine -prilocaine  (EMLA ) cream Apply to affected area once   magnesium  oxide (MAG-OX) 400 (240 Mg) MG tablet Take 1 tablet (400 mg total) by mouth daily. (Patient taking differently: Take 400 mg by mouth daily. PRN)   ondansetron  (ZOFRAN ) 8 MG tablet Take 8 mg by  mouth every 8 (eight) hours as needed for nausea or vomiting.   ondansetron  (ZOFRAN ) 8 MG tablet Take 1 tablet (8 mg total) by mouth every 8 (eight) hours as needed for nausea or vomiting. Start on the third day after chemotherapy.   polyethylene glycol (MIRALAX  / GLYCOLAX ) 17 g packet Take 17 g by mouth daily as needed for mild constipation or moderate constipation. Also available OTC   potassium chloride  SA (KLOR-CON  M) 20 MEQ tablet Take 1 tablet (20 mEq total) by mouth daily. Take twice a day for 3 days then once daily   torsemide  (DEMADEX ) 20 MG tablet TAKE 1 TABLET BY MOUTH EVERY DAY AS NEEDED FOR LEG EDEMA OR ABRUPT WEIGHT GAIN   traMADol  (ULTRAM ) 50 MG tablet Take 1 tablet (50 mg total) by mouth every 8 (eight) hours as needed for moderate pain (pain score 4-6) or severe pain (pain score 7-10) (pain).   pantoprazole  (PROTONIX ) 40 MG tablet Take 1 tablet (40 mg total) by mouth 2 (two) times daily for 56 days, THEN 1 tablet (40 mg total) daily. (Patient taking differently: Take 1 tablet (40mg ) by mouth twice daily.)   prochlorperazine  (COMPAZINE ) 10 MG tablet Take 1 tablet (10 mg total) by mouth every 6 (six) hours as needed for nausea or vomiting. (Patient not taking: Reported on 01/08/2024)   [DISCONTINUED] gabapentin  (NEURONTIN ) 100 MG capsule Take 1 capsule 3 x /day for Neuropathy Pain (Patient not taking: Reported on 01/25/2020)   No facility-administered encounter medications  on file as of 01/08/2024.     Patient Active Problem List   Diagnosis Date Noted   Positive double stranded DNA antibody test 11/06/2023   Chronic combined systolic and diastolic heart failure (HCC) 10/16/2023   Anemia 10/08/2023   Non-ST elevation (NSTEMI) myocardial infarction (HCC) 10/07/2023   Acute respiratory failure with hypoxia and hypercapnia (HCC) 10/07/2023   Acute systolic heart failure (HCC) 10/07/2023   Hypercapnic respiratory failure (HCC) 10/06/2023   Port-A-Cath in place 09/12/2023   Generalized  weakness 09/02/2023   Stricture and stenosis of esophagus 08/31/2023   Multiple duodenal ulcers 08/31/2023   Dysphagia 08/30/2023   Malnutrition of moderate degree 08/29/2023   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   Leucocytosis 08/28/2023   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   Perinephric abdominal mass 08/03/2023   Hyperkalemia 08/03/2023   Hypercalcemia 06/15/2023   Traumatic perinephric hematoma of right side, initial encounter 06/15/2023   ILD (interstitial lung disease) (HCC) 06/15/2023   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   Weakness 06/15/2023   Kidney laceration, right 06/15/2023   Atherosclerosis of aorta 02/07/2023   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 12 point ros is otherwise negative Physical Exam   BP 133/69   Pulse 76   Temp 98 F (36.7 C)   Resp 18   Ht 5' 1 (1.549 m)   Wt 120 lb (54.4 kg)   SpO2 100%   BMI 22.67 kg/m   Physical Exam  Constitutional:  oriented to person, place, and time. appears well-developed and well-nourished. No distress.  HENT: Gregory/AT, PERRLA, no scleral icterus Mouth/Throat: Oropharynx is clear and moist. No oropharyngeal exudate.  Cardiovascular: Normal rate, regular rhythm and normal heart sounds. Exam reveals no gallop and no friction rub.  No murmur heard.   Pulmonary/Chest: Effort normal and breath sounds normal. No respiratory distress.  has no wheezes.  Neck = supple, no nuchal rigidity Abdominal: Soft. Bowel sounds are normal.  exhibits no distension. There is no tenderness.  Lymphadenopathy: no cervical adenopathy. No axillary adenopathy Neurological: alert and oriented to person, place, and time.  Skin: Skin is warm and dry. No rash noted. No erythema.  Psychiatric: a normal mood and affect.  behavior is normal.    CBC Lab Results  Component Value Date   WBC 2.8 (L) 01/05/2024   RBC 2.99 (L) 01/05/2024   HGB 8.9 (L) 01/05/2024   HCT 27.2 (L) 01/05/2024   PLT 133 (L) 01/05/2024   MCV 91.0 01/05/2024   MCH 29.8 01/05/2024   MCHC 32.7 01/05/2024   RDW 20.5 (H) 01/05/2024   LYMPHSABS 1.2 01/05/2024   MONOABS 0.7 01/05/2024   EOSABS 0.1 01/05/2024    BMET Lab Results  Component Value Date   NA 139 01/05/2024   K 4.2 01/05/2024   CL 106 01/05/2024  CO2 27 01/05/2024   GLUCOSE 84 01/05/2024   BUN 53 (H) 01/05/2024   CREATININE 2.17 (H) 01/05/2024   CALCIUM 9.7 01/05/2024   GFRNONAA 22 (L) 01/05/2024   GFRAA 52 (L) 08/07/2020      Assessment and Plan  Rash = would refer to dermatologist. She has one establish- recommend to have her follow up with them.  Chronic Hep b virus = on 10/24 is the next time oncology will due blood work; still plan taking entecavir . Check hep B VL  Long term medication management = check cmp

## 2024-01-09 ENCOUNTER — Ambulatory Visit

## 2024-01-09 VITALS — BP 131/67 | HR 79 | Temp 98.6°F | Resp 16

## 2024-01-09 DIAGNOSIS — C8338 Diffuse large B-cell lymphoma, lymph nodes of multiple sites: Secondary | ICD-10-CM

## 2024-01-09 DIAGNOSIS — C9110 Chronic lymphocytic leukemia of B-cell type not having achieved remission: Secondary | ICD-10-CM | POA: Diagnosis not present

## 2024-01-09 MED ORDER — PEGFILGRASTIM-CBQV 6 MG/0.6ML ~~LOC~~ SOSY
6.0000 mg | PREFILLED_SYRINGE | Freq: Once | SUBCUTANEOUS | Status: AC
  Administered 2024-01-09: 6 mg via SUBCUTANEOUS
  Filled 2024-01-09: qty 0.6

## 2024-01-14 ENCOUNTER — Ambulatory Visit: Payer: Self-pay | Admitting: Internal Medicine

## 2024-01-14 NOTE — Progress Notes (Signed)
 Patient is overdue for a follow-up. Please call and schedule an appointment to discuss results.  Give followup with APP. CT with fibrosis

## 2024-01-15 ENCOUNTER — Ambulatory Visit

## 2024-01-15 ENCOUNTER — Encounter: Payer: Self-pay | Admitting: Internal Medicine

## 2024-01-15 ENCOUNTER — Inpatient Hospital Stay

## 2024-01-15 NOTE — Progress Notes (Signed)
 ATC 1x Left VM sent LTR

## 2024-01-19 ENCOUNTER — Other Ambulatory Visit (HOSPITAL_COMMUNITY): Payer: Self-pay

## 2024-01-19 ENCOUNTER — Other Ambulatory Visit: Payer: Self-pay

## 2024-01-19 ENCOUNTER — Encounter: Payer: Self-pay | Admitting: Pharmacist

## 2024-01-19 NOTE — Progress Notes (Signed)
 ATC 2x left VM

## 2024-01-23 ENCOUNTER — Other Ambulatory Visit (HOSPITAL_COMMUNITY): Payer: Self-pay

## 2024-01-24 ENCOUNTER — Other Ambulatory Visit: Payer: Self-pay

## 2024-01-26 ENCOUNTER — Inpatient Hospital Stay: Admitting: Hematology

## 2024-01-26 ENCOUNTER — Inpatient Hospital Stay

## 2024-01-26 ENCOUNTER — Other Ambulatory Visit

## 2024-01-26 ENCOUNTER — Ambulatory Visit: Admitting: Hematology

## 2024-01-26 VITALS — BP 137/57 | Temp 97.6°F | Resp 17 | Ht 61.0 in | Wt 124.0 lb

## 2024-01-26 VITALS — BP 140/58 | HR 81 | Temp 98.0°F | Resp 24

## 2024-01-26 DIAGNOSIS — Z5111 Encounter for antineoplastic chemotherapy: Secondary | ICD-10-CM

## 2024-01-26 DIAGNOSIS — C9110 Chronic lymphocytic leukemia of B-cell type not having achieved remission: Secondary | ICD-10-CM | POA: Diagnosis not present

## 2024-01-26 DIAGNOSIS — C8338 Diffuse large B-cell lymphoma, lymph nodes of multiple sites: Secondary | ICD-10-CM

## 2024-01-26 LAB — CMP (CANCER CENTER ONLY)
ALT: 10 U/L (ref 0–44)
AST: 16 U/L (ref 15–41)
Albumin: 4.1 g/dL (ref 3.5–5.0)
Alkaline Phosphatase: 72 U/L (ref 38–126)
Anion gap: 5 (ref 5–15)
BUN: 58 mg/dL — ABNORMAL HIGH (ref 8–23)
CO2: 25 mmol/L (ref 22–32)
Calcium: 9.4 mg/dL (ref 8.9–10.3)
Chloride: 108 mmol/L (ref 98–111)
Creatinine: 2.57 mg/dL — ABNORMAL HIGH (ref 0.44–1.00)
GFR, Estimated: 18 mL/min — ABNORMAL LOW (ref >60)
Glucose, Bld: 80 mg/dL (ref 70–99)
Potassium: 4.3 mmol/L (ref 3.5–5.1)
Sodium: 138 mmol/L (ref 135–145)
Total Bilirubin: 0.6 mg/dL (ref 0.0–1.2)
Total Protein: 6 g/dL — ABNORMAL LOW (ref 6.5–8.1)

## 2024-01-26 LAB — CBC WITH DIFFERENTIAL (CANCER CENTER ONLY)
Abs Immature Granulocytes: 0.03 K/uL (ref 0.00–0.07)
Basophils Absolute: 0 K/uL (ref 0.0–0.1)
Basophils Relative: 0 %
Eosinophils Absolute: 0.2 K/uL (ref 0.0–0.5)
Eosinophils Relative: 3 %
HCT: 30.6 % — ABNORMAL LOW (ref 36.0–46.0)
Hemoglobin: 10.1 g/dL — ABNORMAL LOW (ref 12.0–15.0)
Immature Granulocytes: 0 %
Lymphocytes Relative: 22 %
Lymphs Abs: 1.6 K/uL (ref 0.7–4.0)
MCH: 31.1 pg (ref 26.0–34.0)
MCHC: 33 g/dL (ref 30.0–36.0)
MCV: 94.2 fL (ref 80.0–100.0)
Monocytes Absolute: 0.8 K/uL (ref 0.1–1.0)
Monocytes Relative: 12 %
Neutro Abs: 4.6 K/uL (ref 1.7–7.7)
Neutrophils Relative %: 63 %
Platelet Count: 151 K/uL (ref 150–400)
RBC: 3.25 MIL/uL — ABNORMAL LOW (ref 3.87–5.11)
RDW: 20.3 % — ABNORMAL HIGH (ref 11.5–15.5)
WBC Count: 7.3 K/uL (ref 4.0–10.5)
nRBC: 0 % (ref 0.0–0.2)

## 2024-01-26 LAB — SAMPLE TO BLOOD BANK

## 2024-01-26 MED ORDER — DEXAMETHASONE SOD PHOSPHATE PF 10 MG/ML IJ SOLN
10.0000 mg | Freq: Once | INTRAMUSCULAR | Status: AC
  Administered 2024-01-26: 10 mg via INTRAVENOUS

## 2024-01-26 MED ORDER — DIPHENHYDRAMINE HCL 25 MG PO CAPS
50.0000 mg | ORAL_CAPSULE | Freq: Once | ORAL | Status: AC
  Administered 2024-01-26: 50 mg via ORAL
  Filled 2024-01-26: qty 2

## 2024-01-26 MED ORDER — PALONOSETRON HCL INJECTION 0.25 MG/5ML: 0.2500 mg | Freq: Once | INTRAVENOUS | Status: DC

## 2024-01-26 MED ORDER — SODIUM CHLORIDE 0.9 % IV SOLN
600.0000 mg | Freq: Once | INTRAVENOUS | Status: AC
  Administered 2024-01-26: 600 mg via INTRAVENOUS
  Filled 2024-01-26: qty 50

## 2024-01-26 MED ORDER — SODIUM CHLORIDE 0.9 % IV SOLN: INTRAVENOUS | Status: DC

## 2024-01-26 MED ORDER — SODIUM CHLORIDE 0.9 % IV SOLN
1.4000 mg/kg | Freq: Once | INTRAVENOUS | Status: AC
  Administered 2024-01-26: 70 mg via INTRAVENOUS
  Filled 2024-01-26: qty 3.5

## 2024-01-26 MED ORDER — ACETAMINOPHEN 325 MG PO TABS
650.0000 mg | ORAL_TABLET | Freq: Once | ORAL | Status: AC
  Administered 2024-01-26: 650 mg via ORAL
  Filled 2024-01-26: qty 2

## 2024-01-26 NOTE — Progress Notes (Signed)
 Patient observed for 30 min after Polivy  infusion.  Tolerated treatment well without incident.  VSS at discharge.  Escorted to lobby by wheelchair.

## 2024-01-26 NOTE — Patient Instructions (Signed)
 [facility] - A DEPT OF [facility]  Discharge Instructions: Thank you for choosing [facility] to provide your oncology and hematology care.   If you have a lab appointment with the Cancer Center, please go directly to the Cancer Center and check in at the registration area.   Wear comfortable clothing and clothing appropriate for easy access to any Portacath or PICC line.   We strive to give you quality time with your provider. You may need to reschedule your appointment if you arrive late (15 or more minutes).  Arriving late affects you and other patients whose appointments are after yours.  Also, if you miss three or more appointments without notifying the office, you may be dismissed from the clinic at the provider's discretion.      For prescription refill requests, have your pharmacy contact our office and allow 72 hours for refills to be completed.    Today you received the following chemotherapy and/or immunotherapy agents Ruxience , Polivy     To help prevent nausea and vomiting after your treatment, we encourage you to take your nausea medication as directed.  BELOW ARE SYMPTOMS THAT SHOULD BE REPORTED IMMEDIATELY: *FEVER GREATER THAN 100.4 F (38 C) OR HIGHER *CHILLS OR SWEATING *NAUSEA AND VOMITING THAT IS NOT CONTROLLED WITH YOUR NAUSEA MEDICATION *UNUSUAL SHORTNESS OF BREATH *UNUSUAL BRUISING OR BLEEDING *URINARY PROBLEMS (pain or burning when urinating, or frequent urination) *BOWEL PROBLEMS (unusual diarrhea, constipation, pain near the anus) TENDERNESS IN MOUTH AND THROAT WITH OR WITHOUT PRESENCE OF ULCERS (sore throat, sores in mouth, or a toothache) UNUSUAL RASH, SWELLING OR PAIN  UNUSUAL VAGINAL DISCHARGE OR ITCHING   Items with * indicate a potential emergency and should be followed up as soon as possible or go to the Emergency Department if any problems should occur.  Please show the CHEMOTHERAPY ALERT CARD or  IMMUNOTHERAPY ALERT CARD at check-in to the Emergency Department and triage nurse.  Should you have questions after your visit or need to cancel or reschedule your appointment, please contact [facility] - A DEPT OF [facility]  Dept: [phone]  and follow the prompts.  Office hours are 8:00 a.m. to 4:30 p.m. Monday - Friday. Please note that voicemails left after 4:00 p.m. may not be returned until the following business day.  We are closed weekends and major holidays. You have access to a nurse at all times for urgent questions. Please call the main number to the clinic Dept: [phone] and follow the prompts.   For any non-urgent questions, you may also contact your provider using MyChart. We now offer e-Visits for anyone 76 and older to request care online for non-urgent symptoms. For details visit [email or web address].   Also download the MyChart app! Go to the app store, search MyChart, open the app, select [place], and log in with your MyChart username and password.

## 2024-01-26 NOTE — Progress Notes (Signed)
 HEMATOLOGY ONCOLOGY PROGRESS NOTE  Date of service: 01/26/2024  Patient Care Team: Ngetich, Roxan BROCKS, NP as PCP - General (Family Medicine) Kate Lonni CROME, MD as PCP - Cardiology (Cardiology) Arlana Arnt, MD as Consulting Physician (Otolaryngology) Luis Purchase, MD as Consulting Physician (Gastroenterology) Raeanne, Shanda SQUIBB, MD (Inactive) as Consulting Physician (Obstetrics and Gynecology) Roz Anes, MD as Consulting Physician (Ophthalmology) Quinn Odor, [facility] (Inactive) as Pharmacist (Pharmacist) Onesimo Emaline Brink, MD as Consulting Physician (Hematology)  CHIEF COMPLAINT/PURPOSE OF CONSULTATION: Follow-up for 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 female who is here for continued evaluation and management of NHL, unspecified body region. Patient was following up with Dr. Amadeo.    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 BM Bx Cyclin D1 IHC +ve byt FISH neg . She has presented with splenomegaly, anemia, and lymphocytosis. 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    Patient's current treatment is Imbruvica  280 mg. She was started on Imbruvica  560 mg on January 2022, which was then reduced to 280 mg in July 2023.    Patient was last seen by Dr. Amadeo on 02/10/2022 and she was doing well overall.    Patient reports she has been doing well overall since our last visit. She does complains of itchy skin rashes, mostly around the face and head, for around 2-3 weeks ago. Patient notes that she was started couple of new medications from other physicians, but she is unsure of the names. Patient notes that she was started on oxycodone  around 3-4 weeks ago. She discontinued oxycodone .    She notes that there is redness around her face, which is not new.    Patient is  currently taking Imbruvica  280 mg as prescribed and has been tolerating it well without any severe toxicities.    She is currently taking Iron supplement, calcium supplement, and Vitamin-D supplement.    She denies fever, chills, night sweats, unexpected weight loss, back pain, abdominal pain, chest pain, or leg swelling. Patient does complain of occasional abdominal pain.    Patient notes she had a surgery near her right neck/ear area to remove an enlarged lymph node couple years ago. She complains of mild occasional pain near the site.  SUMMARY OF ONCOLOGIC HISTORY: Oncology History  Diffuse large B-cell lymphoma of lymph nodes of multiple regions (HCC)  08/18/2023 Initial Diagnosis   Diffuse large B-cell lymphoma of lymph nodes of multiple regions (HCC)   08/23/2023 - 10/07/2023 Chemotherapy   Patient is on Treatment Plan : NON-HODGKINS LYMPHOMA R-CHOP q21d     12/15/2023 -  Chemotherapy   Patient is on Treatment Plan : NON-HODGKINS LYMPHOMA RELAPSED/ REFRACTORY DLBCL Polatuzumab D1 + Bendamustine D1,2 + Rituximab  D1 q21d x 6 cycles     Prior Therapy:  She is status post bone marrow biopsy completed on March 05, 2020.    Prednisone  60 mg daily in the January 2022 to treat her hemolytic anemia.  This was tapered off after complete hematological response.   CURRENT THERAPY: Cycle 3 / Day 1 of Polatuzumab + Bendamustine + Rituximab    INTERVAL HISTORY:  Jacqueline Hancock is a 83 y.o. 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 is ambulating with a wheelchair, accompanied by son.  she was last seen by me on 11/29/2023; at the time she  mentioned experiencing improved PO intake.  Saw Neomi Johnston DASEN, PA-C on 12/13/2023 and reported some weight loss, attributed to improvement of LE edema, & then on 01/05/2024 endorsed new onset diffuse pruritus with peeling of skin in the right intertriginous area, a rash on her forearms and back,and had  noticed swelling and peeling of skin of her left fingers.   Today, she says that she has been well, having tolerated her last treatment - has been eating and hydrating well. Denies any new infection issues, fevers/chills, drenching night sweats, abdominal discomfort, new lumps/bumps, leg swelling, change in breathing, or fatigue. Reports rash is improving.   REVIEW OF SYSTEMS:    10 Point review of systems of done and is negative except as noted above.  MEDICAL HISTORY Past Medical History:  Diagnosis Date   CLL (chronic lymphocytic leukemia) (HCC)    Congestive heart failure (CHF) (HCC) 09/2023   Dyspnea 2021   with exersion    Early cataracts, bilateral    Elevated hemoglobin A1c    Fibroids    GERD (gastroesophageal reflux disease)    Hyperlipidemia    Hypertension    Lymphoma (HCC)    Osteopenia    Personal history of radiation therapy    11/15/2023 - 11/21/2023 Abdomen- Dr. Lauraine Golden    SURGICAL HISTORY Past Surgical History:  Procedure Laterality Date   ABDOMINAL HYSTERECTOMY     APPENDECTOMY     BALLOON DILATION N/A 08/31/2023   Procedure: BALLOON DILATION;  Surgeon: Charlanne Groom, MD;  Location: [facility];  Service: Gastroenterology;  Laterality: N/A;   CATARACT EXTRACTION, BILATERAL Bilateral 09/2017   Dr. 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   TONSILLECTOMY AND ADENOIDECTOMY      SOCIAL HISTORY Social History   Tobacco Use   Smoking status: Never    Passive exposure: Past   Smokeless tobacco: Never  Vaping Use   Vaping status: Never Used  Substance Use Topics   Alcohol use: No   Drug use: Never    Social History   Social History Narrative   Not on file    SOCIAL DRIVERS OF HEALTH SDOH  Screenings   Food Insecurity: No Food Insecurity (11/13/2023)  Housing: Low Risk  (11/13/2023)  Transportation Needs: No Transportation Needs (11/13/2023)  Utilities: Not At Risk (11/13/2023)  Alcohol Screen: Low Risk  (10/11/2023)  Depression (PHQ2-9): Low Risk  (01/26/2024)  Financial Resource Strain: Low Risk  (10/11/2023)  Social Connections: Socially Isolated (11/13/2023)  Tobacco Use: Low Risk  (01/08/2024)  Health Literacy: Adequate Health Literacy (02/16/2023)     FAMILY HISTORY Family History  Problem Relation Age of Onset   Leukemia Mother    Hypertension Father    Diabetes Sister    Heart disease Brother    Diabetes Brother    Diabetes Brother    Hypertension Son    Heart disease Son      ALLERGIES: is allergic to peanut (diagnostic) and nexium [esomeprazole magnesium ].  MEDICATIONS  Current Outpatient Medications  Medication Sig Dispense Refill   acyclovir  (ZOVIRAX ) 400 MG tablet Take 1 tablet (400 mg total) by mouth 2 (two) times daily. 60 tablet 5   allopurinol  (ZYLOPRIM ) 300 MG tablet Take 0.5 tablets (150 mg total) by mouth daily. 30 tablet 1  aspirin  81 MG chewable tablet Chew 1 tablet (81 mg total) by mouth daily. 90 tablet 0   dexamethasone  (DECADRON ) 4 MG tablet Take 2 tablets (8 mg total) by mouth daily. Start the day after bendamustine chemotherapy for 2 days. Take with food. 30 tablet 1   empagliflozin  (JARDIANCE ) 10 MG TABS tablet Take 1 tablet (10 mg total) by mouth daily before breakfast. 30 tablet 5   entecavir  (BARACLUDE ) 0.5 MG tablet Take 1 tablet (0.5 mg total) by mouth every 3 (three) days. 30 tablet 2   lidocaine -prilocaine  (EMLA ) cream Apply to affected area once 30 g 3   magnesium  oxide (MAG-OX) 400 (240 Mg) MG tablet Take 1 tablet (400 mg total) by mouth daily. (Patient taking differently: Take 400 mg by mouth daily. PRN) 30 tablet 0   ondansetron  (ZOFRAN ) 8 MG tablet Take 8 mg by mouth every 8 (eight) hours as needed for nausea or vomiting.      ondansetron  (ZOFRAN ) 8 MG tablet Take 1 tablet (8 mg total) by mouth every 8 (eight) hours as needed for nausea or vomiting. Start on the third day after chemotherapy. 30 tablet 1   pantoprazole  (PROTONIX ) 40 MG tablet Take 1 tablet (40 mg total) by mouth 2 (two) times daily for 56 days, THEN 1 tablet (40 mg total) daily. (Patient taking differently: Take 1 tablet (40mg ) by mouth twice daily.) 142 tablet 0   polyethylene glycol (MIRALAX  / GLYCOLAX ) 17 g packet Take 17 g by mouth daily as needed for mild constipation or moderate constipation. Also available OTC 30 each 0   potassium chloride  SA (KLOR-CON  M) 20 MEQ tablet Take 1 tablet (20 mEq total) by mouth daily. Take twice a day for 3 days then once daily 60 tablet 0   prochlorperazine  (COMPAZINE ) 10 MG tablet Take 1 tablet (10 mg total) by mouth every 6 (six) hours as needed for nausea or vomiting. 30 tablet 1   torsemide  (DEMADEX ) 20 MG tablet TAKE 1 TABLET BY MOUTH EVERY DAY AS NEEDED FOR LEG EDEMA OR ABRUPT WEIGHT GAIN 90 tablet 1   traMADol  (ULTRAM ) 50 MG tablet Take 1 tablet (50 mg total) by mouth every 8 (eight) hours as needed for moderate pain (pain score 4-6) or severe pain (pain score 7-10) (pain). 20 tablet 0   No current facility-administered medications for this visit.    PHYSICAL EXAMINATION: ECOG PERFORMANCE STATUS: 1 - Symptomatic but completely ambulatory VITALS: Vitals:   01/26/24 1018  BP: (!) 137/57  Resp: 17  Temp: 97.6 F (36.4 C)  SpO2: 99%   Filed Weights   01/26/24 1018  Weight: 124 lb (56.2 kg)   Body mass index is 23.43 kg/m.  GENERAL: alert, in no acute distress and comfortable SKIN: no acute rashes, no significant lesions EYES: conjunctiva are pink and non-injected, sclera anicteric OROPHARYNX: MMM, no exudates, no oropharyngeal erythema or ulceration NECK: supple, no JVD LYMPH:  no palpable lymphadenopathy in the cervical, axillary or inguinal regions LUNGS: clear to auscultation b/l with normal  respiratory effort HEART: regular rate & rhythm ABDOMEN:  normoactive bowel sounds , non tender, not distended, no hepatosplenomegaly Extremity: no pedal edema PSYCH: alert & oriented x 3 with fluent speech NEURO: no focal motor/sensory deficits  LABORATORY DATA:   I have reviewed the data as listed     Latest Ref Rng & Units 01/26/2024    9:52 AM 01/05/2024    8:43 AM 12/13/2023    1:09 PM  CBC EXTENDED  WBC 4.0 -  10.5 K/uL 7.3  2.8  5.0   RBC 3.87 - 5.11 MIL/uL 3.25  2.99  3.04   Hemoglobin 12.0 - 15.0 g/dL 89.8  8.9  8.5   HCT 63.9 - 46.0 % 30.6  27.2  26.5   Platelets 150 - 400 K/uL 151  133  157   NEUT# 1.7 - 7.7 K/uL 4.6  0.7  2.9   Lymph# 0.7 - 4.0 K/uL 1.6  1.2  1.2       Latest Ref Rng & Units 01/26/2024    9:52 AM 01/05/2024    8:43 AM 12/13/2023    1:09 PM  CMP  Glucose 70 - 99 mg/dL 80  84  884   BUN 8 - 23 mg/dL 58  53  59   Creatinine 0.44 - 1.00 mg/dL 7.42  7.82  7.98   Sodium 135 - 145 mmol/L 138  139  136   Potassium 3.5 - 5.1 mmol/L 4.3  4.2  4.0   Chloride 98 - 111 mmol/L 108  106  103   CO2 22 - 32 mmol/L 25  27  26    Calcium 8.9 - 10.3 mg/dL 9.4  9.7  9.2   Total Protein 6.5 - 8.1 g/dL 6.0  6.2  6.2   Total Bilirubin 0.0 - 1.2 mg/dL 0.6  0.9  0.6   Alkaline Phos 38 - 126 U/L 72  63  119   AST 15 - 41 U/L 16  15  12    ALT 0 - 44 U/L 10  10  8     [facility] Lab Results  Component Value Date   [facility] 411 (H) 11/13/2023     Component     Latest Ref Rng 08/18/2023  HBV DNA SERPL PCR-ACNC     IU/mL 110   HBV DNA SERPL PCR-LOG IU     log10 IU/mL 2.041   Test Info: Comment   Hepatitis A AB,Total     NON-REACTIVE  REACTIVE !   HCV Ab     NON REACTIVE  NON REACTIVE   HEP B CORE AB     Negative  Positive !   Hepatitis B Surface Ag     NON REACTIVE  Reactive !   Uric Acid, Serum     2.5 - 7.1 mg/dL      Legend: ! Abnormal    PATHOLOGY Surgical Pathology CASE: [phone] PATIENT: Jacqueline Hancock Bone Marrow Report     Clinical History:  lymphocytosis, possible lymphoma     DIAGNOSIS:  BONE MARROW, ASPIRATE, CLOT, CORE: -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  The latter is primarily seen by flow cytometry.  The findings favor mantle cell lymphoma but correlation with cytogenetic and FISH studies is recommended.  MICROSCOPIC DESCRIPTION:  PERIPHERAL BLOOD SMEAR: The red blood cells display prominent anisocytosis with macrocytic and normocytic cells.  There is mild to moderate poikilocytosis with teardrop cells, elliptocytes.  There is moderate polychromasia.  The white blood cells are increased in number with lymphocytosis characterized by predominance of small to medium sized lymphoid cells displaying high nuclear cytoplasmic ratio, round to irregular nuclei, coarse chromatin and small to inconspicuous nucleoli. The platelets are normal in number.        RADIOGRAPHIC STUDIES: I have personally reviewed  the radiological images as listed and agreed with the findings in the report. CT CHEST ABDOMEN PELVIS WO CONTRAST Result Date: 12/21/2023 CLINICAL DATA:  Hematologic malignancy, assess treatment response, large B-cell lymphoma EXAM: CT CHEST, ABDOMEN AND PELVIS WITHOUT CONTRAST TECHNIQUE: Multidetector CT imaging of the chest, abdomen and pelvis was performed following the standard protocol without IV contrast. RADIATION DOSE REDUCTION: This exam was performed according to the departmental dose-optimization program which includes automated exposure control, adjustment of the mA and/or kV according to patient size and/or use of iterative reconstruction technique. COMPARISON:  PET-CT, 10/19/2023  FINDINGS: CT CHEST FINDINGS Cardiovascular: Right chest port catheter. Normal heart size. No pericardial effusion. Mediastinum/Nodes: No enlarged mediastinal, hilar, or axillary lymph nodes. Thyroid  gland, trachea, and esophagus demonstrate no significant findings. 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 No pleural effusion or pneumothorax. Musculoskeletal: No chest wall abnormality. No acute osseous findings. CT ABDOMEN PELVIS FINDINGS Hepatobiliary: No solid liver abnormality is seen. No gallstones, gallbladder wall thickening, or biliary dilatation. Pancreas: Unremarkable. No pancreatic ductal dilatation or surrounding inflammatory changes. Spleen: Normal in size without significant abnormality. Adrenals/Urinary Tract: Adrenal glands are unremarkable. Diminished size of a large soft tissue mass encasing the right kidney and proximal right ureter, on today's examination measuring 9.5 x 6.5 cm, previously 12.9 x 10.0 cm (series 2, image 62). New, mild left hydronephrosis and proximal hydroureter, no obstructing etiology directly visualized in the distal ureter. Bladder is unremarkable. Stomach/Bowel: Stomach is within normal limits. Appendix appears normal. No evidence of bowel wall thickening, distention, or inflammatory changes. Large burden of stool in the colon. Vascular/Lymphatic: Aortic atherosclerosis. Multiple new bulky lymph nodes, including in the central small bowel mesentery measuring 5.5 x 3.2 cm (series 2, image 71) and in the bilateral pelvic sidewalls and inguinal stations, large right pelvic sidewall node measuring 4.5 x 2.3 cm (series 2, image 94). Reproductive: Hysterectomy. Other: No abdominal wall hernia or abnormality. No ascites. Musculoskeletal: No acute osseous findings. IMPRESSION: 1. Multiple new bulky lymph nodes, including in the central  small bowel mesentery, bilateral pelvic sidewalls and inguinal stations, consistent with worsened lymphoma. 2. Diminished size of a large soft tissue mass encasing the right kidney and proximal right ureter. 3. New, mild left hydronephrosis and proximal hydroureter, no calculus or other obstructing etiology directly visualized in the distal ureter. 4. See separately reported examination of the chest for discussion of fibrotic interstitial lung disease. Aortic Atherosclerosis (ICD10-I70.0). Electronically Signed   By: Marolyn JONETTA Jaksch M.D.   On: 12/21/2023 07:42   CT Chest High Resolution Result Date: 12/21/2023 CLINICAL DATA:  Diffuse interstitial lung disease positive dsDNA, ENA, additional history large B-cell lymphoma * Tracking Code: BO * EXAM: CT CHEST WITHOUT CONTRAST TECHNIQUE: Multidetector CT imaging of the chest was performed following the standard protocol without intravenous contrast. High resolution imaging of the lungs, as well as inspiratory and expiratory imaging, was performed. RADIATION DOSE REDUCTION: This exam was performed according to the departmental dose-optimization program which includes automated exposure control, adjustment of the mA and/or kV according to patient size and/or use of iterative reconstruction technique. COMPARISON:  PET-CT, 10/19/2023 CT chest, 12/02/2022 FINDINGS: Cardiovascular: Right chest port catheter normal heart size. No pericardial effusion. Mediastinum/Nodes: No enlarged mediastinal, hilar, or axillary lymph nodes. Thyroid  gland, trachea, and esophagus demonstrate no significant findings. Lungs/Pleura: Unchanged mild pulmonary fibrosis in a pattern with apical to basal gradient, featuring a preponderance of  irregular ground-glass as well as peribronchovascular and peripheral irregular interstitial opacity and septal thickening. No significant air trapping on expiratory phase imaging. No pleural effusion or pneumothorax. Upper Abdomen: No acute abnormality.  Musculoskeletal: No chest wall abnormality. No acute osseous findings. IMPRESSION: 1. Unchanged mild pulmonary fibrosis in a pattern with apical to basal gradient, featuring a preponderance of irregular ground-glass as well as peribronchovascular and peripheral irregular interstitial opacity and septal thickening. No significant air trapping on expiratory phase imaging. Findings are suggestive of an alternative diagnosis (not UIP) per consensus guidelines. In this clinical setting chronic sequelae of drug toxicity would be a substantial differential consideration, and given report of abnormal titers connective tissue disorder associated interstitial lung disease would also be a significant consideration. Findings are suggestive of an alternative diagnosis (not UIP) per consensus guidelines: Diagnosis of Idiopathic Pulmonary Fibrosis: An Official ATS/ERS/JRS/ALAT Clinical Practice Guideline. Am JINNY Honey Crit Care Med Vol 198, Iss 5, [phone], Dec 03 2016. 2. No evidence of lymphadenopathy or metastatic disease in the chest. Electronically Signed   By: Marolyn JONETTA Jaksch M.D.   On: 12/21/2023 07:36   VAS US  LOWER EXTREMITY VENOUS (DVT) Result Date: 11/14/2023  Lower Venous DVT Study Patient Name:  JERELYN TRIMARCO  Date of Exam:   11/14/2023 Medical Rec #: [id number]    Accession #:    [id number] Date of Birth: 21-Oct-1940    Patient Gender: F Patient Age:   34 years Exam Location:  [facility] Procedure:      VAS US  LOWER EXTREMITY VENOUS (DVT) Referring Phys: OWEN REGALADO --------------------------------------------------------------------------------  Indications: Edema.  Performing Technologist: Vernon Lindau, RVT  Examination Guidelines: A complete evaluation includes B-mode imaging, spectral Doppler, color Doppler, and power Doppler as needed of all accessible portions of each vessel. Bilateral testing is considered an integral part of a complete examination. Limited examinations for reoccurring indications may  be performed as noted. The reflux portion of the exam is performed with the patient in reverse Trendelenburg.  +---------+---------------+---------+-----------+----------+--------------+ RIGHT    CompressibilityPhasicitySpontaneityPropertiesThrombus Aging +---------+---------------+---------+-----------+----------+--------------+ CFV      Full           Yes      Yes                                 +---------+---------------+---------+-----------+----------+--------------+ SFJ      Full                                                        +---------+---------------+---------+-----------+----------+--------------+ FV Prox  Full                                                        +---------+---------------+---------+-----------+----------+--------------+ FV Mid   Full                                                        +---------+---------------+---------+-----------+----------+--------------+ FV DistalFull                                                        +---------+---------------+---------+-----------+----------+--------------+  PFV      Full                                                        +---------+---------------+---------+-----------+----------+--------------+ POP      Full           Yes      Yes                                 +---------+---------------+---------+-----------+----------+--------------+ PTV      Full                                                        +---------+---------------+---------+-----------+----------+--------------+ PERO     Full                                                        +---------+---------------+---------+-----------+----------+--------------+   +---------+---------------+---------+-----------+----------+--------------+ LEFT     CompressibilityPhasicitySpontaneityPropertiesThrombus Aging +---------+---------------+---------+-----------+----------+--------------+ CFV       Full           Yes      Yes                                 +---------+---------------+---------+-----------+----------+--------------+ SFJ      Full                                                        +---------+---------------+---------+-----------+----------+--------------+ FV Prox  Full                                                        +---------+---------------+---------+-----------+----------+--------------+ FV Mid   Full                                                        +---------+---------------+---------+-----------+----------+--------------+ FV DistalFull                                                        +---------+---------------+---------+-----------+----------+--------------+ PFV      Full                                                        +---------+---------------+---------+-----------+----------+--------------+  POP      Full           Yes      Yes                                 +---------+---------------+---------+-----------+----------+--------------+ PTV      Full                                                        +---------+---------------+---------+-----------+----------+--------------+ PERO     Full                                                        +---------+---------------+---------+-----------+----------+--------------+     Summary: RIGHT: - There is no evidence of deep vein thrombosis in the lower extremity.  - No cystic structure found in the popliteal fossa.  LEFT: - There is no evidence of deep vein thrombosis in the lower extremity.  - No cystic structure found in the popliteal fossa.  *See table(s) above for measurements and observations. Electronically signed by Penne Colorado MD on 11/14/2023 at 10:06:23 PM.    Final    US  RENAL Result Date: 11/13/2023 CLINICAL DATA:  Acute kidney injury.  Diffuse large B-cell lymphoma. EXAM: RENAL / URINARY TRACT ULTRASOUND COMPLETE COMPARISON:  PET CT  10/19/2023. CT abdomen and pelvis 10/06/2023. FINDINGS: Right Kidney: Heterogeneous mass is seen in the region of the right kidney measuring 20.3 x 10.1 x 16.1 cm. Normal renal parenchyma is unable to be differentiated from this mass. Overall dimensions are similar to prior CT given differences in technique. Left Kidney: Renal measurements: 12.2 x 5.0 x 4.8 cm = volume: 152 mL. Echogenicity within normal limits. There is mild hydronephrosis. No focal lesion identified. Bladder: Appears normal for degree of bladder distention. Other: None. IMPRESSION: 1. Grossly unchanged heterogeneous mass in the region of the right kidney measuring 20.3 x 10.1 x 16.1 cm. Normal renal parenchyma is unable to be differentiated from this mass. 2. Mild left hydronephrosis. Electronically Signed   By: Greig Pique M.D.   On: 11/13/2023 16:38   DG Chest Portable 1 View Result Date: 11/13/2023 EXAM: 1 VIEW XRAY OF THE CHEST 11/13/2023 12:22:57 PM COMPARISON: AP radiograph of the chest dated 10/06/2023. CLINICAL HISTORY: CHF. Per chart: Pt presents from [facility] for HGB of 6.6. H/x non hodgkins lymphoma, HF, Hep B. FINDINGS: LUNGS AND PLEURA: The hazy pulmonary opacities noted on the previous study have resolved in the interim. No pleural effusion. No pneumothorax. HEART AND MEDIASTINUM: The heart is borderline in size. No acute abnormality of the mediastinal silhouette. BONES AND SOFT TISSUES: No acute osseous abnormality. An endotracheal tube and gastric tube have been removed. A right internal jugular chest port remains in place. IMPRESSION: 1. Resolution of hazy pulmonary opacities noted on the previous study. 2. Borderline heart size. Electronically signed by: evalene coho 11/13/2023 01:39 PM EDT RP Workstation: [id number]     ASSESSMENT & PLAN:  83 y.o. female with  1.  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 Though discussed with patient  that  FISH was neg and so Mantle cell lymphoma not confirm. Findings more consistent with CLL.   2.  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   3.  High risk of tumor lysis syndrome   4.  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   5) history of hypercalcemia - ? From dehydration vs recurrent lymphoma   6) hepatitis B chronic active disease   PLAN:   - Discussed lab results on 01/26/2024 in detail with patient: CBC stable with WBC of 7.3K increased from 2.8K, Hemoglobin of 10.1 increased from 8.9, and PLTs of 151K increased from 133K - Independently reviewed CMP with Creatinine 2.57 increased from 2.17 and Potassium 4.2. - patient appropriate to proceed with C3 of Pola/Ritux with the same supportive medications -pet/ct prior to C4 - Patient will need to continue her hepatitis B suppressive treatments and continues to be on Entecavir  per ID. - Will continue allopurinol  for TLS prophylaxis - Continue close follow-up with PCP for management of congestive heart failure and other medical issues.  FOLLOW-UP  - PET/CT scan in 2 weeks - Please schedule next cycle of polatuzumab rituximab  in 3 weeks with port flush labs and MD visit  The total time spent in the appointment was 30 minutes* .  All of the patient's questions were answered and the patient knows to call the clinic with any problems, questions, or concerns.  Emaline Saran MD MS AAHIVMS [facility] Hematology/Oncology Physician [facility]  *Total Encounter Time as defined by the Centers for Medicare and Medicaid Services includes, in addition to the face-to-face time of a patient visit (documented in the note above) non-face-to-face time: obtaining and reviewing outside history, ordering and reviewing medications, tests or procedures, care coordination (communications  with other health care professionals or caregivers) and documentation in the medical record.  I,Emily Lagle,acting as a neurosurgeon for Emaline Saran, MD.,have documented all relevant documentation on the behalf of Emaline Saran, MD,as directed by  Emaline Saran, MD while in the presence of Emaline Saran, MD.  I have reviewed the above documentation for accuracy and completeness, and I agree with the above.  Rashawd Laskaris, MD

## 2024-01-26 NOTE — Progress Notes (Signed)
 Per Dr Onesimo, okay to increase Rituxan  dose for her weight gain.  Marixa Mellott, Pharm.D., CPP 01/26/2024@12 :04 PM

## 2024-01-29 ENCOUNTER — Inpatient Hospital Stay

## 2024-01-29 VITALS — BP 143/63 | HR 77 | Temp 98.7°F | Resp 17

## 2024-01-29 DIAGNOSIS — C9110 Chronic lymphocytic leukemia of B-cell type not having achieved remission: Secondary | ICD-10-CM | POA: Diagnosis not present

## 2024-01-29 DIAGNOSIS — C8338 Diffuse large B-cell lymphoma, lymph nodes of multiple sites: Secondary | ICD-10-CM

## 2024-01-29 MED ORDER — PEGFILGRASTIM-CBQV 6 MG/0.6ML ~~LOC~~ SOSY
6.0000 mg | PREFILLED_SYRINGE | Freq: Once | SUBCUTANEOUS | Status: AC
  Administered 2024-01-29: 6 mg via SUBCUTANEOUS
  Filled 2024-01-29: qty 0.6

## 2024-01-30 ENCOUNTER — Other Ambulatory Visit (HOSPITAL_COMMUNITY): Payer: Self-pay

## 2024-01-30 ENCOUNTER — Encounter: Payer: Self-pay | Admitting: Pharmacist

## 2024-01-30 ENCOUNTER — Other Ambulatory Visit: Payer: Self-pay

## 2024-02-01 ENCOUNTER — Encounter: Payer: Self-pay | Admitting: Hematology

## 2024-02-02 ENCOUNTER — Other Ambulatory Visit: Payer: Self-pay

## 2024-02-05 ENCOUNTER — Other Ambulatory Visit (HOSPITAL_COMMUNITY): Payer: Self-pay

## 2024-02-07 ENCOUNTER — Telehealth (HOSPITAL_COMMUNITY): Payer: Self-pay | Admitting: Cardiology

## 2024-02-07 NOTE — Telephone Encounter (Signed)
 Called to confirm/remind patient of their appointment at the [facility] on 02/07/24.   Appointment:   [x] Confirmed  [] Left mess   [] No answer/No voice mail  [] VM Full/unable to leave message  [] Phone not in service  Patient reminded to bring all medications and/or complete list.  Confirmed patient has transportation. Gave directions, instructed to utilize valet parking.

## 2024-02-08 ENCOUNTER — Ambulatory Visit (HOSPITAL_BASED_OUTPATIENT_CLINIC_OR_DEPARTMENT_OTHER)
Admission: RE | Admit: 2024-02-08 | Discharge: 2024-02-08 | Disposition: A | Discharge status: Home / Self Care | Source: Ambulatory Visit | Attending: Cardiology | Admitting: Cardiology

## 2024-02-08 ENCOUNTER — Ambulatory Visit (HOSPITAL_COMMUNITY)
Admission: RE | Admit: 2024-02-08 | Discharge: 2024-02-08 | Disposition: A | Discharge status: Home / Self Care | Source: Ambulatory Visit | Attending: Cardiology | Admitting: Cardiology

## 2024-02-08 ENCOUNTER — Encounter (HOSPITAL_COMMUNITY): Payer: Self-pay | Admitting: Cardiology

## 2024-02-08 VITALS — BP 132/64 | HR 80 | Ht 61.0 in | Wt 127.0 lb

## 2024-02-08 DIAGNOSIS — R400 Somnolence: Secondary | ICD-10-CM | POA: Insufficient documentation

## 2024-02-08 DIAGNOSIS — C8330 Diffuse large B-cell lymphoma, unspecified site: Secondary | ICD-10-CM | POA: Insufficient documentation

## 2024-02-08 DIAGNOSIS — C8510 Unspecified B-cell lymphoma, unspecified site: Secondary | ICD-10-CM | POA: Insufficient documentation

## 2024-02-08 DIAGNOSIS — Z7984 Long term (current) use of oral hypoglycemic drugs: Secondary | ICD-10-CM | POA: Diagnosis not present

## 2024-02-08 DIAGNOSIS — I5022 Chronic systolic (congestive) heart failure: Secondary | ICD-10-CM

## 2024-02-08 DIAGNOSIS — Z7982 Long term (current) use of aspirin: Secondary | ICD-10-CM | POA: Diagnosis not present

## 2024-02-08 DIAGNOSIS — D61818 Other pancytopenia: Secondary | ICD-10-CM | POA: Insufficient documentation

## 2024-02-08 DIAGNOSIS — I5042 Chronic combined systolic (congestive) and diastolic (congestive) heart failure: Secondary | ICD-10-CM | POA: Diagnosis present

## 2024-02-08 DIAGNOSIS — B1910 Unspecified viral hepatitis B without hepatic coma: Secondary | ICD-10-CM | POA: Diagnosis not present

## 2024-02-08 DIAGNOSIS — Z79899 Other long term (current) drug therapy: Secondary | ICD-10-CM | POA: Diagnosis not present

## 2024-02-08 DIAGNOSIS — R5383 Other fatigue: Secondary | ICD-10-CM | POA: Insufficient documentation

## 2024-02-08 DIAGNOSIS — D591 Autoimmune hemolytic anemia, unspecified: Secondary | ICD-10-CM | POA: Diagnosis not present

## 2024-02-08 DIAGNOSIS — I130 Hypertensive heart and chronic kidney disease with heart failure and stage 1 through stage 4 chronic kidney disease, or unspecified chronic kidney disease: Secondary | ICD-10-CM | POA: Diagnosis not present

## 2024-02-08 DIAGNOSIS — I252 Old myocardial infarction: Secondary | ICD-10-CM | POA: Insufficient documentation

## 2024-02-08 DIAGNOSIS — C9110 Chronic lymphocytic leukemia of B-cell type not having achieved remission: Secondary | ICD-10-CM | POA: Insufficient documentation

## 2024-02-08 DIAGNOSIS — N1832 Chronic kidney disease, stage 3b: Secondary | ICD-10-CM | POA: Insufficient documentation

## 2024-02-08 DIAGNOSIS — N183 Chronic kidney disease, stage 3 unspecified: Secondary | ICD-10-CM | POA: Diagnosis not present

## 2024-02-08 MED ORDER — JARDIANCE 10 MG PO TABS: 10.0000 mg | ORAL_TABLET | Freq: Every day | ORAL | 3 refills | Status: AC

## 2024-02-08 MED ORDER — ASPIRIN 81 MG PO CHEW
81.0000 mg | CHEWABLE_TABLET | Freq: Every day | ORAL | 3 refills | Status: AC
Start: 2024-02-08 — End: ?

## 2024-02-08 MED ORDER — TORSEMIDE 20 MG PO TABS
20.0000 mg | ORAL_TABLET | ORAL | 1 refills | Status: AC | PRN
Start: 2024-02-08 — End: ?

## 2024-02-08 NOTE — Progress Notes (Signed)
  Echocardiogram 2D Echocardiogram has been performed.  Norleen ORN [facility] 02/08/2024, 9:33 AM

## 2024-02-08 NOTE — Patient Instructions (Signed)
 CHANGE Torsemide  to as needed for weight gain of 3 lb in 24 hours or 5 lb in a week.  Your physician recommends that you schedule a follow-up appointment in: 6 months.  If you have any questions or concerns before your next appointment please send us  a message through [facility] or call our office at [phone].    TO LEAVE A MESSAGE FOR THE NURSE SELECT OPTION 2, PLEASE LEAVE A MESSAGE INCLUDING: YOUR NAME DATE OF BIRTH CALL BACK NUMBER REASON FOR CALL**this is important as we prioritize the call backs  YOU WILL RECEIVE A CALL BACK THE SAME DAY AS LONG AS YOU CALL BEFORE 4:00 PM  At the [facility], you and your health needs are our priority. As part of our continuing mission to provide you with exceptional heart care, we have created designated Provider Care Teams. These Care Teams include your primary Cardiologist (physician) and Advanced Practice Providers (APPs- Physician Assistants and Nurse Practitioners) who all work together to provide you with the care you need, when you need it.   You may see any of the following providers on your designated Care Team at your next follow up: Dr Toribio Fuel Dr Ezra Shuck Dr. Morene Brownie Greig Mosses, NP Caffie Shed, [place] [facility] [place], [place] Beckey Coe, NP Jordan Lee, NP Ellouise Class, NP Tinnie Redman, PharmD Jaun Bash, PharmD   Please be sure to bring in all your medications bottles to every appointment.    Thank you for choosing [facility]

## 2024-02-09 ENCOUNTER — Other Ambulatory Visit: Payer: Self-pay

## 2024-02-09 LAB — ECHOCARDIOGRAM COMPLETE
AR max vel: 2.95 cm2
AV Area VTI: 3.08 cm2
AV Area mean vel: 2.77 cm2
AV Mean grad: 4 mmHg
AV Peak grad: 7 mmHg
Ao pk vel: 1.32 m/s
Area-P 1/2: 4.01 cm2
Calc EF: 55.7 %
MV VTI: 3.14 cm2
S' Lateral: 2.9 cm
Single Plane A2C EF: 52 %
Single Plane A4C EF: 63.9 %

## 2024-02-09 NOTE — Progress Notes (Signed)
 [facility] CONSULT NOTE   Primary Care: Ngetich, Roxan BROCKS, NP Primary Cardiologist: Lonni LITTIE [name], MD HF Cardiologist: Dr. Rolan  Chief complaint: CHF  HPI: Jacqueline Hancock is an 83 y.o. female with CLL with histological transformation to large B cell lymphoma, HTN,  HBV, CKD stage 3 and systolic CHF.   Patient was treated for her lymphoma with R-mini CHOP chemotherapy.  She was admitted in 7/25 with acute hypoxemic respiratory failure and volume overload. Required brief ICU stay with somnolence and flash pulmonary edema requiring intubation.  Found to have new acute systolic heart failure. Echo showed EF 25-30%, previous EF  60-65% 2 months prior.  She was extubated. Found to have NSTEMI, however unable to undergo heart cath due to pancytopenia and AKI.  GDMT titrated, but limited due to hypotension and elevated renal function. She was discharged home, weight 127 lbs  She is now getting chemotherapy with polatuzumab, bendamustine, and Rituxan .   Echo was done today and reviewed by me, EF 60-65% with normal RV, no significant valvular abnormalities, IVC normal.   She returns today for followup of CHF. I am seeing her for the first time today and reviewed all her records.  She fatigues easily, came in with wheelchair today.  She reports no dyspnea walking around her house or with ADLs.  No chest pain.  No lightheadedness or falls.   Labs (7/25): K 3.4, creatinine 2.14, hgb 8.7 Labs (10/25): K 4.3, creatinine 2.57  ECG (personally reviewed): NSR, nonspecific T wave flattening.   PMH: 1. CLL with transformation to large B cell lymphoma in 2021: She received R-mini CHOP chemotherapy in 2025. Currently getting polatuzumab, bendamustine, and Rituxan .  2. H/o autoimmune hemolytic anemia related to lymphoma in 2021.  3. HBV: Diagnosed 7/25, creatinine with entecavir .  4. 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  - Echo 5/25: EF 60-65% - Echo  7/25: LV with global and RWMA, LVEF 25-30% - Echo (11/25): EF 60-65% with normal RV, no significant valvular abnormalities, IVC normal.  5. CKD stage 3 6. HTN  Current Outpatient Medications  Medication Sig Dispense Refill   acyclovir  (ZOVIRAX ) 400 MG tablet Take 1 tablet (400 mg total) by mouth 2 (two) times daily. 60 tablet 5   allopurinol  (ZYLOPRIM ) 300 MG tablet Take 0.5 tablets (150 mg total) by mouth daily. 30 tablet 1   dexamethasone  (DECADRON ) 4 MG tablet Take 2 tablets (8 mg total) by mouth daily. Start the day after bendamustine chemotherapy for 2 days. Take with food. 30 tablet 1   lidocaine -prilocaine  (EMLA ) cream Apply to affected area once 30 g 3   ondansetron  (ZOFRAN ) 8 MG tablet Take 8 mg by mouth every 8 (eight) hours as needed for nausea or vomiting.     pantoprazole  (PROTONIX ) 40 MG tablet Take 1 tablet (40 mg total) by mouth 2 (two) times daily for 56 days, THEN 1 tablet (40 mg total) daily. (Patient taking differently: Take 1 tablet (40mg ) by mouth twice daily.) 142 tablet 0   polyethylene glycol (MIRALAX  / GLYCOLAX ) 17 g packet Take 17 g by mouth daily as needed for mild constipation or moderate constipation. Also available OTC 30 each 0   prochlorperazine  (COMPAZINE ) 10 MG tablet Take 1 tablet (10 mg total) by mouth every 6 (six) hours as needed for nausea or vomiting. 30 tablet 1   traMADol  (ULTRAM ) 50 MG tablet Take 1 tablet (50 mg total) by mouth every 8 (eight) hours as needed for  moderate pain (pain score 4-6) or severe pain (pain score 7-10) (pain). 20 tablet 0   aspirin  81 MG chewable tablet Chew 1 tablet (81 mg total) by mouth daily. 90 tablet 3   entecavir  (BARACLUDE ) 0.5 MG tablet Take 1 tablet (0.5 mg total) by mouth every 3 (three) days. (Patient not taking: Reported on 02/08/2024) 30 tablet 2   JARDIANCE  10 MG TABS tablet Take 1 tablet (10 mg total) by mouth daily before breakfast. 90 tablet 3   magnesium  oxide (MAG-OX) 400 (240 Mg) MG tablet Take 1 tablet (400  mg total) by mouth daily. (Patient not taking: Reported on 02/08/2024) 30 tablet 0   ondansetron  (ZOFRAN ) 8 MG tablet Take 1 tablet (8 mg total) by mouth every 8 (eight) hours as needed for nausea or vomiting. Start on the third day after chemotherapy. (Patient not taking: Reported on 02/08/2024) 30 tablet 1   potassium chloride  SA (KLOR-CON  M) 20 MEQ tablet Take 1 tablet (20 mEq total) by mouth daily. Take twice a day for 3 days then once daily (Patient not taking: Reported on 02/08/2024) 60 tablet 0   torsemide  (DEMADEX ) 20 MG tablet Take 1 tablet (20 mg total) by mouth as needed. For weight gain of 3 lb in 24 hours or 5 lb in a week. 90 tablet 1   No current facility-administered medications for this encounter.   Allergies  Allergen Reactions   Peanut (Diagnostic) Itching    Peanut Butter.   Nexium [Esomeprazole Magnesium ] Other (See Comments)    Headache   Social History   Socioeconomic History   Marital status: Widowed    Spouse name: Not on file   Number of children: 3   Years of education: Not on file   Highest education level: Not on file  Occupational History   Occupation: retired  Tobacco Use   Smoking status: Never    Passive exposure: Past   Smokeless tobacco: Never  Vaping Use   Vaping status: Never Used  Substance and Sexual Activity   Alcohol use: No   Drug use: Never   Sexual activity: Not on file  Other Topics Concern   Not on file  Social History Narrative   Not on file   Social Drivers of Health   Financial Resource Strain: Low Risk  (10/11/2023)   Overall Financial Resource Strain (CARDIA)    Difficulty of Paying Living Expenses: Not very hard  Food Insecurity: No Food Insecurity (11/13/2023)   Hunger Vital Sign    Worried About Running Out of Food in the Last Year: Never true    Ran Out of Food in the Last Year: Never true  Transportation Needs: No Transportation Needs (11/13/2023)   PRAPARE - Administrator, Civil Service (Medical): No     Lack of Transportation (Non-Medical): No  Physical Activity: Not on file  Stress: Not on file  Social Connections: Socially Isolated (11/13/2023)   Social Connection and Isolation Panel    Frequency of Communication with Friends and Family: More than three times a week    Frequency of Social Gatherings with Friends and Family: Never    Attends Religious Services: Never    Database Administrator or Organizations: No    Attends Banker Meetings: Never    Marital Status: Widowed  Intimate Partner Violence: Not At Risk (11/13/2023)   Humiliation, Afraid, Rape, and Kick questionnaire    Fear of Current or Ex-Partner: No    Emotionally Abused: No  Physically Abused: No    Sexually Abused: No    Family History  Problem Relation Age of Onset   Leukemia Mother    Hypertension Father    Diabetes Sister    Heart disease Brother    Diabetes Brother    Diabetes Brother    Hypertension Son    Heart disease Son    ROS: All systems reviewed and negative except as per HPI.   Wt Readings from Last 3 Encounters:  02/08/24 57.6 kg (127 lb)  01/26/24 56.2 kg (124 lb)  01/08/24 54.4 kg (120 lb)   BP 132/64   Pulse 80   Ht 5' 1 (1.549 m)   Wt 57.6 kg (127 lb)   SpO2 98%   BMI 24.00 kg/m   PHYSICAL EXAM: General: NAD, frail.  Neck: No JVD, no thyromegaly or thyroid  nodule.  Lungs: Clear to auscultation bilaterally with normal respiratory effort. CV: Nondisplaced PMI.  Heart regular S1/S2, no S3/S4, no murmur.  No peripheral edema.  No carotid bruit.  Normal pedal pulses.  Abdomen: Soft, nontender, no hepatosplenomegaly, no distention.  Skin: Intact without lesions or rashes.  Neurologic: Alert and oriented x 3.  Psych: Normal affect. Extremities: No clubbing or cyanosis.  HEENT: Normal.   ASSESSMENT & PLAN: 1. Chronic systolic CHF: Newly diagnosed 05/7972. Echo 5/25 showed EF 60-65%. She has been on R-mini CHOP with adriamycin , s/p 3/5 cycles as of 10/04/23. Admitted 7/25  with new acute systolic heart failure and NSTEMI. Echo showed EF 25-30% with LV with global and RWMA. Unable to undergo LHC due to pancytopenia and AKI.  Drop in EF correlates with timing after starting adriamycin . Echo today showed EF up to 60-65% with normal RV. I suspect that her cardiomyopathy was most likely due Adriamycin  and has recovered.  NYHA class III mainly due to fatigue that may be related to cancer, chemo, and frailty.  She is not volume overloaded on exam.  - Stop daily torsemide , can use prn.  - Continue Jardiance  10 mg daily. 2. NSTEMI: HsTrop elevated during to 1183 during 5/25 admission. Unfortunately, unable to have ischemic evaluation with LHC due to pancytopenia and elevated creatinine.  No chest pain.  With recovery of EF as noted above, suspect demand ischemia from CHF/volume overload.  - Continue ASA 81.  3. 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 Not candidate for nephrostomy tube per IR (during recent admission). Per Urology, not candidate for stenting. Most recent creatinine 2.57.  - Stop torsemide  as above.  4. CLL with transformation to large B-cell lymphoma: Was treated with R-mini CHOP with Adriamycin  prior to 5/25 admission with CHF.  Significant drop in EF after starting Adriamycin .   - Followed by Dr. Onesimo, Heme/Onc.  Followup in 6 months with APP.   I spent 41 minutes reviewing records, interviewing/examining patient, and managing orders.    Ezra Shuck 02/09/24

## 2024-02-12 ENCOUNTER — Ambulatory Visit (HOSPITAL_COMMUNITY): Payer: Self-pay | Admitting: Internal Medicine

## 2024-02-13 ENCOUNTER — Other Ambulatory Visit (HOSPITAL_COMMUNITY): Payer: Self-pay

## 2024-02-13 ENCOUNTER — Encounter (HOSPITAL_COMMUNITY)
Admission: RE | Admit: 2024-02-13 | Discharge: 2024-02-13 | Disposition: A | Discharge status: Home / Self Care | Source: Ambulatory Visit | Attending: Hematology | Admitting: Hematology

## 2024-02-13 DIAGNOSIS — C8338 Diffuse large B-cell lymphoma, lymph nodes of multiple sites: Secondary | ICD-10-CM | POA: Diagnosis present

## 2024-02-13 LAB — GLUCOSE, CAPILLARY: Glucose-Capillary: 80 mg/dL (ref 70–99)

## 2024-02-13 MED ORDER — FLUDEOXYGLUCOSE F - 18 (FDG) INJECTION
6.4000 | Freq: Once | INTRAVENOUS | Status: AC
  Administered 2024-02-13: 6.28 via INTRAVENOUS

## 2024-02-14 ENCOUNTER — Other Ambulatory Visit: Payer: Self-pay

## 2024-02-14 ENCOUNTER — Encounter: Payer: Medicare Other | Admitting: Nurse Practitioner

## 2024-02-14 ENCOUNTER — Telehealth: Payer: Self-pay

## 2024-02-14 DIAGNOSIS — C8338 Diffuse large B-cell lymphoma, lymph nodes of multiple sites: Secondary | ICD-10-CM

## 2024-02-14 NOTE — Telephone Encounter (Signed)
 Message sent to oncology scheduling team to see if ID labs can be drawn at 11/14 port flush appointment.   Zahmir Lalla, BSN, RN

## 2024-02-14 NOTE — Telephone Encounter (Signed)
 Notified Landry Silvan, RN with cancer center of labs requested by Dr. Luiz.   Hershy Flenner, BSN, RN

## 2024-02-14 NOTE — Telephone Encounter (Signed)
-----   Message from Nurse Enis RAMAN sent at 01/09/2024  8:43 PM EDT ----- Dr. Luiz requesting patient have following labs drawn Hepatitis B E Antibody Hepatitis B DNA, ultraquantitative, PCR Comp Met (CMET)  Next time at oncology on 10/24?

## 2024-02-16 ENCOUNTER — Inpatient Hospital Stay: Attending: Hematology

## 2024-02-16 ENCOUNTER — Inpatient Hospital Stay: Admitting: Hematology

## 2024-02-16 ENCOUNTER — Inpatient Hospital Stay

## 2024-02-16 VITALS — BP 145/73 | HR 87 | Temp 98.0°F | Resp 16

## 2024-02-16 VITALS — BP 145/65 | HR 77 | Temp 97.6°F | Resp 17 | Ht 61.0 in | Wt 132.0 lb

## 2024-02-16 DIAGNOSIS — C8338 Diffuse large B-cell lymphoma, lymph nodes of multiple sites: Secondary | ICD-10-CM

## 2024-02-16 DIAGNOSIS — C9110 Chronic lymphocytic leukemia of B-cell type not having achieved remission: Secondary | ICD-10-CM | POA: Insufficient documentation

## 2024-02-16 DIAGNOSIS — Z5189 Encounter for other specified aftercare: Secondary | ICD-10-CM | POA: Diagnosis not present

## 2024-02-16 DIAGNOSIS — Z5112 Encounter for antineoplastic immunotherapy: Secondary | ICD-10-CM | POA: Insufficient documentation

## 2024-02-16 DIAGNOSIS — Z23 Encounter for immunization: Secondary | ICD-10-CM | POA: Diagnosis not present

## 2024-02-16 LAB — CBC WITH DIFFERENTIAL (CANCER CENTER ONLY)
Abs Immature Granulocytes: 0.03 K/uL (ref 0.00–0.07)
Basophils Absolute: 0 K/uL (ref 0.0–0.1)
Basophils Relative: 1 %
Eosinophils Absolute: 0.2 K/uL (ref 0.0–0.5)
Eosinophils Relative: 3 %
HCT: 28.7 % — ABNORMAL LOW (ref 36.0–46.0)
Hemoglobin: 9.6 g/dL — ABNORMAL LOW (ref 12.0–15.0)
Immature Granulocytes: 1 %
Lymphocytes Relative: 16 %
Lymphs Abs: 1 K/uL (ref 0.7–4.0)
MCH: 32.4 pg (ref 26.0–34.0)
MCHC: 33.4 g/dL (ref 30.0–36.0)
MCV: 97 fL (ref 80.0–100.0)
Monocytes Absolute: 0.6 K/uL (ref 0.1–1.0)
Monocytes Relative: 9 %
Neutro Abs: 4.3 K/uL (ref 1.7–7.7)
Neutrophils Relative %: 70 %
Platelet Count: 125 K/uL — ABNORMAL LOW (ref 150–400)
RBC: 2.96 MIL/uL — ABNORMAL LOW (ref 3.87–5.11)
RDW: 18.2 % — ABNORMAL HIGH (ref 11.5–15.5)
WBC Count: 6.1 K/uL (ref 4.0–10.5)
nRBC: 0 % (ref 0.0–0.2)

## 2024-02-16 LAB — SAMPLE TO BLOOD BANK

## 2024-02-16 LAB — CMP (CANCER CENTER ONLY)
ALT: 12 U/L (ref 0–44)
AST: 17 U/L (ref 15–41)
Albumin: 4 g/dL (ref 3.5–5.0)
Alkaline Phosphatase: 78 U/L (ref 38–126)
Anion gap: 7 (ref 5–15)
BUN: 43 mg/dL — ABNORMAL HIGH (ref 8–23)
CO2: 23 mmol/L (ref 22–32)
Calcium: 9.1 mg/dL (ref 8.9–10.3)
Chloride: 110 mmol/L (ref 98–111)
Creatinine: 2.74 mg/dL — ABNORMAL HIGH (ref 0.44–1.00)
GFR, Estimated: 17 mL/min — ABNORMAL LOW (ref >60)
Glucose, Bld: 89 mg/dL (ref 70–99)
Potassium: 4.4 mmol/L (ref 3.5–5.1)
Sodium: 140 mmol/L (ref 135–145)
Total Bilirubin: 0.5 mg/dL (ref 0.0–1.2)
Total Protein: 5.7 g/dL — ABNORMAL LOW (ref 6.5–8.1)

## 2024-02-16 MED ORDER — SODIUM CHLORIDE 0.9 % IV SOLN
90.0000 mg | Freq: Once | INTRAVENOUS | Status: AC
  Administered 2024-02-16: 90 mg via INTRAVENOUS
  Filled 2024-02-16: qty 4.5

## 2024-02-16 MED ORDER — ACETAMINOPHEN 325 MG PO TABS
650.0000 mg | ORAL_TABLET | Freq: Once | ORAL | Status: AC
  Administered 2024-02-16: 650 mg via ORAL
  Filled 2024-02-16: qty 2

## 2024-02-16 MED ORDER — SODIUM CHLORIDE 0.9 % IV SOLN: INTRAVENOUS | Status: DC

## 2024-02-16 MED ORDER — DEXAMETHASONE SOD PHOSPHATE PF 10 MG/ML IJ SOLN
10.0000 mg | Freq: Once | INTRAMUSCULAR | Status: AC
  Administered 2024-02-16: 10 mg via INTRAVENOUS

## 2024-02-16 MED ORDER — DIPHENHYDRAMINE HCL 25 MG PO CAPS
50.0000 mg | ORAL_CAPSULE | Freq: Once | ORAL | Status: AC
  Administered 2024-02-16: 50 mg via ORAL
  Filled 2024-02-16: qty 2

## 2024-02-16 MED ORDER — SODIUM CHLORIDE 0.9 % IV SOLN
600.0000 mg | Freq: Once | INTRAVENOUS | Status: AC
  Administered 2024-02-16: 600 mg via INTRAVENOUS
  Filled 2024-02-16: qty 50

## 2024-02-16 MED ORDER — SODIUM CHLORIDE 0.9% FLUSH: 10.0000 mL | INTRAVENOUS | Status: DC | PRN

## 2024-02-16 NOTE — Progress Notes (Signed)
 HEMATOLOGY ONCOLOGY PROGRESS NOTE  Date of service: 02/16/2024  Patient Care Team: Ngetich, Roxan BROCKS, NP as PCP - General (Family Medicine) Kate Lonni CROME, MD as PCP - Cardiology (Cardiology) Arlana Arnt, MD as Consulting Physician (Otolaryngology) Luis Purchase, MD as Consulting Physician (Gastroenterology) Raeanne, Shanda SQUIBB, MD (Inactive) as Consulting Physician (Obstetrics and Gynecology) Roz Anes, MD as Consulting Physician (Ophthalmology) Quinn Odor, [facility] (Inactive) as Pharmacist (Pharmacist) Onesimo Emaline Brink, MD as Consulting Physician (Hematology)  CHIEF COMPLAINT/PURPOSE OF CONSULTATION: Follow-up for 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  HISTORY OF PRESENTING ILLNESS:  (05/11/2022) Jacqueline Hancock is a wonderful 83 y.o. female who is here for continued evaluation and management of NHL, unspecified body region. Patient was following up with Dr. Amadeo.    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 BM Bx Cyclin D1 IHC +ve byt FISH neg . She has presented with splenomegaly, anemia, and lymphocytosis. 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    Patient's current treatment is Imbruvica  280 mg. She was started on Imbruvica  560 mg on January 2022, which was then reduced to 280 mg in July 2023.    Patient was last seen by Dr. Amadeo on 02/10/2022 and she was doing well overall.    Patient reports she has been doing well overall since our last visit. She does complains of itchy skin rashes, mostly around the face and head, for around 2-3 weeks ago. Patient notes that she was started couple of new medications from other physicians, but she is unsure of the names. Patient notes that she was started on oxycodone  around 3-4 weeks ago. She discontinued oxycodone .    She notes that there is redness around her face, which is not new.    Patient  is currently taking Imbruvica  280 mg as prescribed and has been tolerating it well without any severe toxicities.    She is currently taking Iron supplement, calcium supplement, and Vitamin-D supplement.    She denies fever, chills, night sweats, unexpected weight loss, back pain, abdominal pain, chest pain, or leg swelling. Patient does complain of occasional abdominal pain.    Patient notes she had a surgery near her right neck/ear area to remove an enlarged lymph node couple years ago. She complains of mild occasional pain near the site.  SUMMARY OF ONCOLOGIC HISTORY: Oncology History  Diffuse large B-cell lymphoma of lymph nodes of multiple regions (HCC)  08/18/2023 Initial Diagnosis   Diffuse large B-cell lymphoma of lymph nodes of multiple regions (HCC)   08/23/2023 - 10/07/2023 Chemotherapy   Patient is on Treatment Plan : NON-HODGKINS LYMPHOMA R-CHOP q21d     12/15/2023 -  Chemotherapy   Patient is on Treatment Plan : NON-HODGKINS LYMPHOMA RELAPSED/ REFRACTORY DLBCL Polatuzumab D1 + Bendamustine D1,2 + Rituximab  D1 q21d x 6 cycles       INTERVAL HISTORY: Jacqueline Hancock is a 83 y.o. female who is here today for continued evaluation and management of  CLL with Richter's transformation. She is accompanied by her son today and is ambulating with a wheel chair.  she was last seen by me on 01/26/2024; at the time she did not have any concerns and was doing well.   Today, she denies any new concerns. She reports eating and drinking well. Denies any unexpected weight change, abdominal pain, leg swelling, SOB, or changes in breathing.  She saw her Cardiologist last week and her EKG results  from 02/08/2024 showed her heart function was back to normal.   She inquired about receiving the flu and COVID-19 vaccine.    REVIEW OF SYSTEMS:   10 Point review of systems of done and is negative except as noted above.  MEDICAL HISTORY Past Medical History:  Diagnosis Date   CLL (chronic lymphocytic  leukemia) (HCC)    Congestive heart failure (CHF) (HCC) 09/2023   Dyspnea 2021   with exersion    Early cataracts, bilateral    Elevated hemoglobin A1c    Fibroids    GERD (gastroesophageal reflux disease)    Hyperlipidemia    Hypertension    Lymphoma (HCC)    Osteopenia    Personal history of radiation therapy    11/15/2023 - 11/21/2023 Abdomen- Dr. Lauraine Golden    SURGICAL HISTORY Past Surgical History:  Procedure Laterality Date   ABDOMINAL HYSTERECTOMY     APPENDECTOMY     BALLOON DILATION N/A 08/31/2023   Procedure: BALLOON DILATION;  Surgeon: Charlanne Groom, MD;  Location: [facility];  Service: Gastroenterology;  Laterality: N/A;   CATARACT EXTRACTION, BILATERAL Bilateral 09/2017   Dr. 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   TONSILLECTOMY AND ADENOIDECTOMY      SOCIAL HISTORY Social History   Tobacco Use   Smoking status: Never    Passive exposure: Past   Smokeless tobacco: Never  Vaping Use   Vaping status: Never Used  Substance Use Topics   Alcohol use: No   Drug use: Never    Social History   Social History Narrative   Not on file    SOCIAL DRIVERS OF HEALTH SDOH Screenings   Food Insecurity: No Food Insecurity (11/13/2023)  Housing: Low Risk  (11/13/2023)  Transportation Needs: No Transportation Needs (11/13/2023)  Utilities: Not At Risk (11/13/2023)  Alcohol Screen: Low Risk  (10/11/2023)  Depression (PHQ2-9): Low Risk  (01/26/2024)  Financial Resource Strain: Low Risk  (10/11/2023)  Social Connections: Socially Isolated (11/13/2023)  Tobacco Use: Low Risk  (02/08/2024)  Health Literacy: Adequate Health Literacy (02/16/2023)     FAMILY HISTORY Family History  Problem Relation Age of Onset    Leukemia Mother    Hypertension Father    Diabetes Sister    Heart disease Brother    Diabetes Brother    Diabetes Brother    Hypertension Son    Heart disease Son      ALLERGIES: is allergic to peanut (diagnostic) and nexium [esomeprazole magnesium ].  MEDICATIONS  Current Outpatient Medications  Medication Sig Dispense Refill   acyclovir  (ZOVIRAX ) 400 MG tablet Take 1 tablet (400 mg total) by mouth 2 (two) times daily. 60 tablet 5   allopurinol  (ZYLOPRIM ) 300 MG tablet Take 0.5 tablets (150 mg total) by mouth daily. 30 tablet 1   aspirin  81 MG chewable tablet Chew 1 tablet (81 mg total) by mouth daily. 90 tablet 3   dexamethasone  (DECADRON ) 4 MG tablet Take 2 tablets (8 mg total) by mouth daily. Start the day after bendamustine chemotherapy for 2 days. Take with food. 30 tablet 1   entecavir  (BARACLUDE ) 0.5 MG tablet Take 1 tablet (0.5 mg total) by mouth every 3 (three) days. (Patient not taking: Reported on 02/08/2024) 30 tablet 2   JARDIANCE  10 MG TABS tablet Take 1  tablet (10 mg total) by mouth daily before breakfast. 90 tablet 3   lidocaine -prilocaine  (EMLA ) cream Apply to affected area once 30 g 3   magnesium  oxide (MAG-OX) 400 (240 Mg) MG tablet Take 1 tablet (400 mg total) by mouth daily. (Patient not taking: Reported on 02/08/2024) 30 tablet 0   ondansetron  (ZOFRAN ) 8 MG tablet Take 8 mg by mouth every 8 (eight) hours as needed for nausea or vomiting.     ondansetron  (ZOFRAN ) 8 MG tablet Take 1 tablet (8 mg total) by mouth every 8 (eight) hours as needed for nausea or vomiting. Start on the third day after chemotherapy. (Patient not taking: Reported on 02/08/2024) 30 tablet 1   pantoprazole  (PROTONIX ) 40 MG tablet Take 1 tablet (40 mg total) by mouth 2 (two) times daily for 56 days, THEN 1 tablet (40 mg total) daily. (Patient taking differently: Take 1 tablet (40mg ) by mouth twice daily.) 142 tablet 0   polyethylene glycol (MIRALAX  / GLYCOLAX ) 17 g packet Take 17 g by mouth daily as  needed for mild constipation or moderate constipation. Also available OTC 30 each 0   potassium chloride  SA (KLOR-CON  M) 20 MEQ tablet Take 1 tablet (20 mEq total) by mouth daily. Take twice a day for 3 days then once daily (Patient not taking: Reported on 02/08/2024) 60 tablet 0   prochlorperazine  (COMPAZINE ) 10 MG tablet Take 1 tablet (10 mg total) by mouth every 6 (six) hours as needed for nausea or vomiting. 30 tablet 1   torsemide  (DEMADEX ) 20 MG tablet Take 1 tablet (20 mg total) by mouth as needed. For weight gain of 3 lb in 24 hours or 5 lb in a week. 90 tablet 1   traMADol  (ULTRAM ) 50 MG tablet Take 1 tablet (50 mg total) by mouth every 8 (eight) hours as needed for moderate pain (pain score 4-6) or severe pain (pain score 7-10) (pain). 20 tablet 0   No current facility-administered medications for this visit.   Facility-Administered Medications Ordered in Other Visits  Medication Dose Route Frequency Provider Last Rate Last Admin   0.9 %  sodium chloride  infusion   Intravenous Continuous Kale, Gautam Kishore, MD 10 mL/hr at 02/16/24 1202 New Bag at 02/16/24 1202   polatuzumab vedotin -piiq (POLIVY ) 90 mg in sodium chloride  0.9 % 50 mL (1.6514 mg/mL) chemo infusion  90 mg Intravenous Once Kale, Gautam Kishore, MD 109 mL/hr at 02/16/24 1551 90 mg at 02/16/24 1551   sodium chloride  flush (NS) 0.9 % injection 10 mL  10 mL Intracatheter PRN Onesimo Emaline Brink, MD        PHYSICAL EXAMINATION: ECOG PERFORMANCE STATUS: 1 - Symptomatic but completely ambulatory VITALS: Vitals:   02/16/24 1100 02/16/24 1103  BP: (!) 148/74 (!) 145/65  Pulse: 77   Resp: 17   Temp: 97.6 F (36.4 C)   SpO2: 100%    Filed Weights   02/16/24 1100  Weight: 132 lb (59.9 kg)   Body mass index is 24.94 kg/m.  GENERAL: alert, in no acute distress and comfortable SKIN: no acute rashes, no significant lesions EYES: conjunctiva are pink and non-injected, sclera anicteric OROPHARYNX: MMM, no exudates, no  oropharyngeal erythema or ulceration NECK: supple, no JVD LYMPH:  no palpable lymphadenopathy in the cervical, axillary or inguinal regions LUNGS: clear to auscultation b/l with normal respiratory effort HEART: regular rate & rhythm ABDOMEN:  normoactive bowel sounds , non tender, not distended, no hepatosplenomegaly Extremity: no pedal edema PSYCH: alert & oriented x 3 with  fluent speech NEURO: no focal motor/sensory deficits  LABORATORY DATA:   I have reviewed the data as listed     Latest Ref Rng & Units 02/16/2024   10:43 AM 01/26/2024    9:52 AM 01/05/2024    8:43 AM  CBC EXTENDED  WBC 4.0 - 10.5 K/uL 6.1  7.3  2.8   RBC 3.87 - 5.11 MIL/uL 2.96  3.25  2.99   Hemoglobin 12.0 - 15.0 g/dL 9.6  89.8  8.9   HCT 63.9 - 46.0 % 28.7  30.6  27.2   Platelets 150 - 400 K/uL 125  151  133   NEUT# 1.7 - 7.7 K/uL 4.3  4.6  0.7   Lymph# 0.7 - 4.0 K/uL 1.0  1.6  1.2        Latest Ref Rng & Units 02/16/2024   10:43 AM 01/26/2024    9:52 AM 01/05/2024    8:43 AM  CMP  Glucose 70 - 99 mg/dL 89  80  84   BUN 8 - 23 mg/dL 43  58  53   Creatinine 0.44 - 1.00 mg/dL 7.25  7.42  7.82   Sodium 135 - 145 mmol/L 140  138  139   Potassium 3.5 - 5.1 mmol/L 4.4  4.3  4.2   Chloride 98 - 111 mmol/L 110  108  106   CO2 22 - 32 mmol/L 23  25  27    Calcium 8.9 - 10.3 mg/dL 9.1  9.4  9.7   Total Protein 6.5 - 8.1 g/dL 5.7  6.0  6.2   Total Bilirubin 0.0 - 1.2 mg/dL 0.5  0.6  0.9   Alkaline Phos 38 - 126 U/L 78  72  63   AST 15 - 41 U/L 17  16  15    ALT 0 - 44 U/L 12  10  10      PATHOLOGY Surgical Pathology CASE: [id number] PATIENT: Naydelin Lakins Bone Marrow Report     Clinical History: lymphocytosis, possible lymphoma     DIAGNOSIS:  BONE MARROW, ASPIRATE, CLOT, CORE: -Hypercellular bone marrow with extensive involvement by a B-cell lymphoproliferative disorder -See comment  PERIPHERAL BLOOD: -Macrocytic anemia -Marked lymphocytosis consistent with lymphoproliferative  disorder  COMMENT:  There is prominent involvement by a B-cell lymphoproliferative process associated with cyclin D1 positivity and partial expression of CD5.  The latter is primarily seen by flow cytometry.  The findings favor mantle cell lymphoma but correlation with cytogenetic and FISH studies is recommended.  MICROSCOPIC DESCRIPTION:  PERIPHERAL BLOOD SMEAR: The red blood cells display prominent anisocytosis with macrocytic and normocytic cells.  There is mild to moderate poikilocytosis with teardrop cells, elliptocytes.  There is moderate polychromasia.  The white blood cells are increased in number with lymphocytosis characterized by predominance of small to medium sized lymphoid cells displaying high nuclear cytoplasmic ratio, round to irregular nuclei, coarse chromatin and small to inconspicuous nucleoli. The platelets are normal in number.         RADIOGRAPHIC STUDIES: I have personally reviewed the radiological images as listed and agreed with the findings in the report. NM PET Image Restag (PS) Skull Base To Thigh Result Date: 02/15/2024 CLINICAL DATA:  Subsequent treatment strategy for large B-cell lymphoma. EXAM: NUCLEAR MEDICINE PET SKULL BASE TO THIGH TECHNIQUE: 6.28 mCi F-18 FDG was injected intravenously. Full-ring PET imaging was performed from the skull base to thigh after the radiotracer. CT data was obtained and used for attenuation correction and anatomic localization. Fasting blood glucose: 80  mg/dl COMPARISON:  CT of the chest, abdomen and pelvis 12/19/2023. PET-CT 10/19/2023. FINDINGS: Mediastinal blood pool activity: SUV max 2.4 Liver activity: SUV max 2.9 NECK: No hypermetabolic cervical lymph nodes are identified. No suspicious activity identified within the pharyngeal mucosal space. Incidental CT findings: none CHEST: There are no hypermetabolic mediastinal, hilar or axillary lymph nodes. No hypermetabolic pulmonary activity or suspicious nodularity.  Incidental CT findings: Right IJ Port-A-Cath extends into the upper right atrium. Mild aortic atherosclerosis. Stable calcified left lower lobe granuloma and interstitial lung disease. ABDOMEN/PELVIS: There is no hypermetabolic activity within the liver, adrenal glands, spleen or pancreas. Previously demonstrated large, partially calcified right retroperitoneal mass involving the right kidney shows further decrease in size, now measuring approximately 0.3 x 4.9 cm on image 99/4 (9.5 x 6.5 cm on most recent CT). There is residual low level metabolic activity within this mass, some of which may reflect collecting system activity in the kidney (currently with an SUV max of 6.1, previously 21.8). No other more hypermetabolic or enlarging lymph nodes are identified within the abdomen or pelvis. Specifically, the previously demonstrated hypermetabolic right pelvic sidewall and right inguinal adenopathy has resolved. The enlarging lymph nodes seen on the most recent CT have all significantly improved, without significant residual hypermetabolic activity. Incidental CT findings: Aortic and branch vessel atherosclerosis. No evidence of hydronephrosis, bowel obstruction or ascites. SKELETON: There is no hypermetabolic activity to suggest osseous metastatic disease. Incidental CT findings: Mild spondylosis. IMPRESSION: 1. Interval response to therapy with further decrease in size of the large right retroperitoneal mass involving the right kidney. There is residual low level metabolic activity within this mass, some of which may reflect collecting system activity in the kidney. Deauville 3/4. 2. Interval resolution of the enlarged abdominopelvic lymph nodes on recent diagnostic CT, without hypermetabolic activity. Deauville 1. 3. No new or progressive disease identified. 4.  Aortic Atherosclerosis (ICD10-I70.0). Electronically Signed   By: Elsie Perone M.D.   On: 02/15/2024 16:26   ECHOCARDIOGRAM COMPLETE Result Date:  02/09/2024    ECHOCARDIOGRAM REPORT   Patient Name:   IVETT LUEBBE Date of Exam: 02/08/2024 Medical Rec #:  [id number]   Height:       61.0 in Accession #:    [id number]  Weight:       124.0 lb Date of Birth:  [birth year]   BSA:          1.541 m Patient Age:    83 years    BP:           143/63 mmHg Patient Gender: F           HR:           84 bpm. Exam Location:  Outpatient Procedure: 2D Echo (Both Spectral and Color Flow Doppler were utilized during            procedure). Indications:    CHF  History:        Patient has prior history of Echocardiogram examinations. CHF.  Sonographer:    Norleen Amour Referring Phys: [phone] ALMA L DIAZ IMPRESSIONS  1. Apical displacement of papillary muscle without associated mitral regurgitation and without HCM. Similar to prior studies. Left ventricular ejection fraction, by estimation, is 60 to 65%. The left ventricle has normal function. The left ventricle has  no regional wall motion abnormalities. Left ventricular diastolic parameters are consistent with Grade I diastolic dysfunction (impaired relaxation).  2. Right ventricular systolic function is normal. The right ventricular size is normal. There  is normal pulmonary artery systolic pressure.  3. The mitral valve is normal in structure. Trivial mitral valve regurgitation.  4. The aortic valve is tricuspid. Aortic valve regurgitation is not visualized.  5. Cannot exclude a small PFO. FINDINGS  Left Ventricle: Apical displacement of papillary muscle without associated mitral regurgitation and without HCM. Similar to prior studies. Left ventricular ejection fraction, by estimation, is 60 to 65%. The left ventricle has normal function. The left ventricle has no regional wall motion abnormalities. The left ventricular internal cavity size was normal in size. There is no left ventricular hypertrophy. Left ventricular diastolic parameters are consistent with Grade I diastolic dysfunction (impaired  relaxation). Right Ventricle: The  right ventricular size is normal. No increase in right ventricular wall thickness. Right ventricular systolic function is normal. There is normal pulmonary artery systolic pressure. The tricuspid regurgitant velocity is 2.69 m/s, and  with an assumed right atrial pressure of 3 mmHg, the estimated right ventricular systolic pressure is 31.9 mmHg. Left Atrium: Left atrial size was normal in size. Right Atrium: Right atrial size was normal in size. Pericardium: There is no evidence of pericardial effusion. Mitral Valve: The mitral valve is normal in structure. Trivial mitral valve regurgitation. MV peak gradient, 5.0 mmHg. The mean mitral valve gradient is 2.0 mmHg. Tricuspid Valve: The tricuspid valve is normal in structure. Tricuspid valve regurgitation is trivial. Aortic Valve: The aortic valve is tricuspid. Aortic valve regurgitation is not visualized. Aortic valve mean gradient measures 4.0 mmHg. Aortic valve peak gradient measures 7.0 mmHg. Aortic valve area, by VTI measures 3.08 cm. Pulmonic Valve: The pulmonic valve was normal in structure. Pulmonic valve regurgitation is not visualized. Aorta: The aortic root and ascending aorta are structurally normal, with no evidence of dilitation. IAS/Shunts: Cannot exclude a small PFO.  LEFT VENTRICLE PLAX 2D LVIDd:         4.50 cm     Diastology LVIDs:         2.90 cm     LV e' medial:    6.75 cm/s LV PW:         0.90 cm     LV E/e' medial:  9.9 LV IVS:        0.70 cm     LV e' lateral:   8.06 cm/s LVOT diam:     2.00 cm     LV E/e' lateral: 8.3 LV SV:         68 LV SV Index:   44 LVOT Area:     3.14 cm LV IVRT:       74 msec  LV Volumes (MOD) LV vol d, MOD A2C: 37.1 ml LV vol d, MOD A4C: 66.7 ml LV vol s, MOD A2C: 17.8 ml LV vol s, MOD A4C: 24.1 ml LV SV MOD A2C:     19.3 ml LV SV MOD A4C:     66.7 ml LV SV MOD BP:      28.7 ml RIGHT VENTRICLE             IVC RV Basal diam:  2.50 cm     IVC diam: 0.60 cm RV S prime:     17.20 cm/s TAPSE (M-mode): 2.2 cm      PULMONARY  VEINS                             Diastolic Velocity: 45.60 cm/s  S/D Velocity:       1.40                             Systolic Velocity:  62.10 cm/s LEFT ATRIUM             Index        RIGHT ATRIUM          Index LA diam:        3.00 cm 1.95 cm/m   RA Area:     8.74 cm LA Vol (A2C):   34.3 ml 22.25 ml/m  RA Volume:   16.00 ml 10.38 ml/m LA Vol (A4C):   25.2 ml 16.35 ml/m LA Biplane Vol: 30.5 ml 19.79 ml/m  AORTIC VALVE                    PULMONIC VALVE AV Area (Vmax):    2.95 cm     PV Vmax:       0.85 m/s AV Area (Vmean):   2.77 cm     PV Peak grad:  2.9 mmHg AV Area (VTI):     3.08 cm AV Vmax:           132.00 cm/s AV Vmean:          94.600 cm/s AV VTI:            0.219 m AV Peak Grad:      7.0 mmHg AV Mean Grad:      4.0 mmHg LVOT Vmax:         124.00 cm/s LVOT Vmean:        83.300 cm/s LVOT VTI:          0.215 m LVOT/AV VTI ratio: 0.98  AORTA Ao Root diam: 2.90 cm Ao Asc diam:  2.70 cm MITRAL VALVE                TRICUSPID VALVE MV Area (PHT): 4.01 cm     TR Peak grad:   28.9 mmHg MV Area VTI:   3.14 cm     TR Vmax:        269.00 cm/s MV Peak grad:  5.0 mmHg MV Mean grad:  2.0 mmHg     SHUNTS MV Vmax:       1.12 m/s     Systemic VTI:  0.22 m MV Vmean:      56.9 cm/s    Systemic Diam: 2.00 cm MV Decel Time: 189 msec MV E velocity: 66.50 cm/s MV A velocity: 101.00 cm/s MV E/A ratio:  0.66 Stanly Leavens MD Electronically signed by Stanly Leavens MD Signature Date/Time: 02/09/2024/5:33:39 AM    Final    CT CHEST ABDOMEN PELVIS WO CONTRAST Result Date: 12/21/2023 CLINICAL DATA:  Hematologic malignancy, assess treatment response, large B-cell lymphoma EXAM: CT CHEST, ABDOMEN AND PELVIS WITHOUT CONTRAST TECHNIQUE: Multidetector CT imaging of the chest, abdomen and pelvis was performed following the standard protocol without IV contrast. RADIATION DOSE REDUCTION: This exam was performed according to the departmental dose-optimization program which includes automated  exposure control, adjustment of the mA and/or kV according to patient size and/or use of iterative reconstruction technique. COMPARISON:  PET-CT, 10/19/2023 FINDINGS: CT CHEST FINDINGS Cardiovascular: Right chest port catheter. Normal heart size. No pericardial effusion. Mediastinum/Nodes: No enlarged mediastinal, hilar, or axillary lymph nodes. Thyroid  gland, trachea, and esophagus demonstrate no significant findings. 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 No pleural effusion or pneumothorax. Musculoskeletal: No chest wall abnormality. No acute osseous findings. CT ABDOMEN PELVIS FINDINGS Hepatobiliary: No solid liver abnormality is seen. No gallstones, gallbladder wall thickening, or biliary dilatation. Pancreas: Unremarkable. No pancreatic ductal dilatation or surrounding inflammatory changes. Spleen: Normal in size without significant abnormality. Adrenals/Urinary Tract: Adrenal glands are unremarkable. Diminished size of a large soft tissue mass encasing the right kidney and proximal right ureter, on today's examination measuring 9.5 x 6.5 cm, previously 12.9 x 10.0 cm (series 2, image 62). New, mild left hydronephrosis and proximal hydroureter, no obstructing etiology directly visualized in the distal ureter. Bladder is unremarkable. Stomach/Bowel: Stomach is within normal limits. Appendix appears normal. No evidence of bowel wall thickening, distention, or inflammatory changes. Large burden of stool in the colon. Vascular/Lymphatic: Aortic atherosclerosis. Multiple new bulky lymph nodes, including in the central small bowel mesentery measuring 5.5 x 3.2 cm (series 2, image 71) and in the bilateral pelvic sidewalls and inguinal stations, large right pelvic sidewall node measuring 4.5 x 2.3 cm (series 2, image 94). Reproductive: Hysterectomy. Other: No abdominal  wall hernia or abnormality. No ascites. Musculoskeletal: No acute osseous findings. IMPRESSION: 1. Multiple new bulky lymph nodes, including in the central small bowel mesentery, bilateral pelvic sidewalls and inguinal stations, consistent with worsened lymphoma. 2. Diminished size of a large soft tissue mass encasing the right kidney and proximal right ureter. 3. New, mild left hydronephrosis and proximal hydroureter, no calculus or other obstructing etiology directly visualized in the distal ureter. 4. See separately reported examination of the chest for discussion of fibrotic interstitial lung disease. Aortic Atherosclerosis (ICD10-I70.0). Electronically Signed   By: Marolyn JONETTA Jaksch M.D.   On: 12/21/2023 07:42   CT Chest High Resolution Result Date: 12/21/2023 CLINICAL DATA:  Diffuse interstitial lung disease positive dsDNA, ENA, additional history large B-cell lymphoma * Tracking Code: BO * EXAM: CT CHEST WITHOUT CONTRAST TECHNIQUE: Multidetector CT imaging of the chest was performed following the standard protocol without intravenous contrast. High resolution imaging of the lungs, as well as inspiratory and expiratory imaging, was performed. RADIATION DOSE REDUCTION: This exam was performed according to the departmental dose-optimization program which includes automated exposure control, adjustment of the mA and/or kV according to patient size and/or use of iterative reconstruction technique. COMPARISON:  PET-CT, 10/19/2023 CT chest, 12/02/2022 FINDINGS: Cardiovascular: Right chest port catheter normal heart size. No pericardial effusion. Mediastinum/Nodes: No enlarged mediastinal, hilar, or axillary lymph nodes. Thyroid  gland, trachea, and esophagus demonstrate no significant findings. Lungs/Pleura: Unchanged mild pulmonary fibrosis in a pattern with apical to basal gradient, featuring a preponderance of irregular ground-glass as well as peribronchovascular and peripheral irregular interstitial opacity and  septal thickening. No significant air trapping on expiratory phase imaging. No pleural effusion or pneumothorax. Upper Abdomen: No acute abnormality. Musculoskeletal: No chest wall abnormality. No acute osseous findings. IMPRESSION: 1. Unchanged mild pulmonary fibrosis in a pattern with apical to basal gradient, featuring a preponderance of irregular ground-glass as well as peribronchovascular and peripheral irregular interstitial opacity and septal thickening. No significant air trapping on expiratory phase imaging. Findings are suggestive of an alternative diagnosis (not UIP) per consensus guidelines. In this clinical setting chronic sequelae of drug toxicity would be a substantial differential consideration, and given report of abnormal titers connective tissue disorder associated interstitial lung disease would also be a significant consideration. Findings are suggestive of an alternative diagnosis (not UIP) per consensus guidelines: Diagnosis of Idiopathic Pulmonary  Fibrosis: An Official ATS/ERS/JRS/ALAT Clinical Practice Guideline. Am JINNY [employer] Crit Care Med Vol 198, Iss 5, [phone], Dec 03 2016. 2. No evidence of lymphadenopathy or metastatic disease in the chest. Electronically Signed   By: Marolyn JONETTA Jaksch M.D.   On: 12/21/2023 07:36     ASSESSMENT & PLAN:  83 y.o. female with 1.  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 Though discussed with patient that FISH was neg and so Mantle cell lymphoma not confirm. Findings more consistent with CLL.   2.  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   3.  High risk of tumor lysis syndrome   4.  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   5) history of hypercalcemia - ? From dehydration vs recurrent lymphoma   6) Hepatitis B  chronic active disease   PLAN:  -PET scan looks exceptionally good today. The scan does not show any signs of lymphoma in the spine or neck. Spots in abdomen have diminished. She sports around the right kidney are borderline lighting up, and are improving.  -We will continue treating and monitoring this to keep this under control long term utilizing the least aggressive approach  -Will repeated the PET scan after the next cycle -Discussed clinical trials available for maintenance treatment after she is in remission  -Looked at EKG results from 02/08/2024. Heart function has greatly improved. -Discussed flu and COVID-19 vaccine, recommended taking these at different dates to avoid febrile reaction -Will take allopurinol  off her medication list, no longer at risk of tumor lysis - Please schedule next 2 cycles of polatuzumab rituximab  q3 weeks with port flush labs and MD visit -referral to Dr Mannie at [facility]  FOLLOW-UP in 3 weeks for labs and follow-up with Dr. Onesimo.  The total time spent in the appointment was 15 minutes* .  All of the patient's questions were answered and the patient knows to call the clinic with any problems, questions, or concerns.  Emaline Onesimo MD MS AAHIVMS [facility] Hematology/Oncology Physician [facility]  *Total Encounter Time as defined by the Centers for Medicare and Medicaid Services includes, in addition to the face-to-face time of a patient visit (documented in the note above) non-face-to-face time: obtaining and reviewing outside history, ordering and reviewing medications, tests or procedures, care coordination (communications with other health care professionals or caregivers) and documentation in the medical record.  I, Alan Blowers, acting as a neurosurgeon for Emaline Onesimo, MD.,have documented all relevant documentation on the behalf of Emaline Onesimo, MD,as directed by  Emaline Onesimo, MD while in the presence of Emaline Onesimo, MD.  I have  reviewed the above documentation for accuracy and completeness, and I agree with the above.  Gautam Kale, MD

## 2024-02-16 NOTE — Progress Notes (Signed)
 Patient observed for 30 minutes post infusion after the Polivy - no issues. Tolerated well. VSS-  BP (!) 145/73 (BP Location: Left Arm, Patient Position: Sitting)   Pulse 87   Temp 98 F (36.7 C) (Oral)   Resp 16   SpO2 100%   WC to the lobby. Son to pick up- called.

## 2024-02-16 NOTE — Progress Notes (Signed)
 Ok to increase Polivy  dose to 90mg  today and tx plan wt basis to today's wt=59.9kg today per Dr. Onesimo.  Bert Givans, PharmD, MBA

## 2024-02-16 NOTE — Patient Instructions (Signed)
 [facility] - A DEPT OF [facility]  Discharge Instructions: Thank you for choosing [facility] to provide your oncology and hematology care.   If you have a lab appointment with the Cancer Center, please go directly to the Cancer Center and check in at the registration area.   Wear comfortable clothing and clothing appropriate for easy access to any Portacath or PICC line.   We strive to give you quality time with your provider. You may need to reschedule your appointment if you arrive late (15 or more minutes).  Arriving late affects you and other patients whose appointments are after yours.  Also, if you miss three or more appointments without notifying the office, you may be dismissed from the clinic at the provider's discretion.      For prescription refill requests, have your pharmacy contact our office and allow 72 hours for refills to be completed.    Today you received the following chemotherapy and/or immunotherapy agents Ruxience , Polivy     To help prevent nausea and vomiting after your treatment, we encourage you to take your nausea medication as directed.  BELOW ARE SYMPTOMS THAT SHOULD BE REPORTED IMMEDIATELY: *FEVER GREATER THAN 100.4 F (38 C) OR HIGHER *CHILLS OR SWEATING *NAUSEA AND VOMITING THAT IS NOT CONTROLLED WITH YOUR NAUSEA MEDICATION *UNUSUAL SHORTNESS OF BREATH *UNUSUAL BRUISING OR BLEEDING *URINARY PROBLEMS (pain or burning when urinating, or frequent urination) *BOWEL PROBLEMS (unusual diarrhea, constipation, pain near the anus) TENDERNESS IN MOUTH AND THROAT WITH OR WITHOUT PRESENCE OF ULCERS (sore throat, sores in mouth, or a toothache) UNUSUAL RASH, SWELLING OR PAIN  UNUSUAL VAGINAL DISCHARGE OR ITCHING   Items with * indicate a potential emergency and should be followed up as soon as possible or go to the Emergency Department if any problems should occur.  Please show the CHEMOTHERAPY ALERT CARD or  IMMUNOTHERAPY ALERT CARD at check-in to the Emergency Department and triage nurse.  Should you have questions after your visit or need to cancel or reschedule your appointment, please contact [facility] - A DEPT OF [facility]  Dept: [phone]  and follow the prompts.  Office hours are 8:00 a.m. to 4:30 p.m. Monday - Friday. Please note that voicemails left after 4:00 p.m. may not be returned until the following business day.  We are closed weekends and major holidays. You have access to a nurse at all times for urgent questions. Please call the main number to the clinic Dept: [phone] and follow the prompts.   For any non-urgent questions, you may also contact your provider using MyChart. We now offer e-Visits for anyone 76 and older to request care online for non-urgent symptoms. For details visit [email or web address].   Also download the MyChart app! Go to the app store, search MyChart, open the app, select [place], and log in with your MyChart username and password.

## 2024-02-17 LAB — HEPATITIS B DNA, ULTRAQUANTITATIVE, PCR
HBV DNA SERPL PCR-ACNC: 30 [IU]/mL
HBV DNA SERPL PCR-LOG IU: 1.477 {Log_IU}/mL

## 2024-02-17 LAB — HEPATITIS B E ANTIBODY: Hep B E Ab: REACTIVE — AB

## 2024-02-19 ENCOUNTER — Inpatient Hospital Stay

## 2024-02-19 VITALS — BP 159/69 | HR 73 | Temp 98.7°F | Resp 17

## 2024-02-19 DIAGNOSIS — Z23 Encounter for immunization: Secondary | ICD-10-CM

## 2024-02-19 DIAGNOSIS — C8338 Diffuse large B-cell lymphoma, lymph nodes of multiple sites: Secondary | ICD-10-CM

## 2024-02-19 DIAGNOSIS — C9110 Chronic lymphocytic leukemia of B-cell type not having achieved remission: Secondary | ICD-10-CM | POA: Diagnosis not present

## 2024-02-19 MED ORDER — INFLUENZA VAC SPLIT HIGH-DOSE 0.5 ML IM SUSY
0.5000 mL | PREFILLED_SYRINGE | Freq: Once | INTRAMUSCULAR | Status: AC
  Administered 2024-02-19: 0.5 mL via INTRAMUSCULAR
  Filled 2024-02-19: qty 0.5

## 2024-02-19 MED ORDER — PEGFILGRASTIM-CBQV 6 MG/0.6ML ~~LOC~~ SOSY
6.0000 mg | PREFILLED_SYRINGE | Freq: Once | SUBCUTANEOUS | Status: AC
  Administered 2024-02-19: 6 mg via SUBCUTANEOUS
  Filled 2024-02-19: qty 0.6

## 2024-02-22 ENCOUNTER — Encounter: Payer: Self-pay | Admitting: Hematology

## 2024-03-04 ENCOUNTER — Other Ambulatory Visit (HOSPITAL_COMMUNITY): Payer: Self-pay

## 2024-03-08 ENCOUNTER — Other Ambulatory Visit (HOSPITAL_COMMUNITY): Payer: Self-pay

## 2024-03-08 ENCOUNTER — Inpatient Hospital Stay: Attending: Hematology

## 2024-03-08 ENCOUNTER — Inpatient Hospital Stay: Attending: Hematology | Admitting: Hematology

## 2024-03-08 ENCOUNTER — Inpatient Hospital Stay

## 2024-03-08 VITALS — BP 142/55 | HR 79 | Temp 98.4°F | Resp 14

## 2024-03-08 VITALS — BP 134/66 | HR 72 | Temp 97.6°F | Resp 17 | Ht 61.0 in | Wt 128.0 lb

## 2024-03-08 DIAGNOSIS — N1330 Unspecified hydronephrosis: Secondary | ICD-10-CM | POA: Insufficient documentation

## 2024-03-08 DIAGNOSIS — Z9221 Personal history of antineoplastic chemotherapy: Secondary | ICD-10-CM | POA: Insufficient documentation

## 2024-03-08 DIAGNOSIS — Z7952 Long term (current) use of systemic steroids: Secondary | ICD-10-CM | POA: Diagnosis not present

## 2024-03-08 DIAGNOSIS — M8580 Other specified disorders of bone density and structure, unspecified site: Secondary | ICD-10-CM | POA: Insufficient documentation

## 2024-03-08 DIAGNOSIS — I700 Atherosclerosis of aorta: Secondary | ICD-10-CM | POA: Insufficient documentation

## 2024-03-08 DIAGNOSIS — Z86018 Personal history of other benign neoplasm: Secondary | ICD-10-CM | POA: Diagnosis not present

## 2024-03-08 DIAGNOSIS — R161 Splenomegaly, not elsewhere classified: Secondary | ICD-10-CM | POA: Insufficient documentation

## 2024-03-08 DIAGNOSIS — B181 Chronic viral hepatitis B without delta-agent: Secondary | ICD-10-CM | POA: Diagnosis not present

## 2024-03-08 DIAGNOSIS — Z5112 Encounter for antineoplastic immunotherapy: Secondary | ICD-10-CM | POA: Diagnosis not present

## 2024-03-08 DIAGNOSIS — E785 Hyperlipidemia, unspecified: Secondary | ICD-10-CM | POA: Diagnosis not present

## 2024-03-08 DIAGNOSIS — Z7982 Long term (current) use of aspirin: Secondary | ICD-10-CM | POA: Diagnosis not present

## 2024-03-08 DIAGNOSIS — D591 Autoimmune hemolytic anemia, unspecified: Secondary | ICD-10-CM | POA: Insufficient documentation

## 2024-03-08 DIAGNOSIS — R194 Change in bowel habit: Secondary | ICD-10-CM | POA: Diagnosis not present

## 2024-03-08 DIAGNOSIS — Z806 Family history of leukemia: Secondary | ICD-10-CM | POA: Diagnosis not present

## 2024-03-08 DIAGNOSIS — C8338 Diffuse large B-cell lymphoma, lymph nodes of multiple sites: Secondary | ICD-10-CM | POA: Diagnosis not present

## 2024-03-08 DIAGNOSIS — I110 Hypertensive heart disease with heart failure: Secondary | ICD-10-CM | POA: Insufficient documentation

## 2024-03-08 DIAGNOSIS — Z79624 Long term (current) use of inhibitors of nucleotide synthesis: Secondary | ICD-10-CM | POA: Diagnosis not present

## 2024-03-08 DIAGNOSIS — Z79899 Other long term (current) drug therapy: Secondary | ICD-10-CM | POA: Insufficient documentation

## 2024-03-08 DIAGNOSIS — K219 Gastro-esophageal reflux disease without esophagitis: Secondary | ICD-10-CM | POA: Diagnosis not present

## 2024-03-08 DIAGNOSIS — C9110 Chronic lymphocytic leukemia of B-cell type not having achieved remission: Secondary | ICD-10-CM | POA: Insufficient documentation

## 2024-03-08 DIAGNOSIS — Z923 Personal history of irradiation: Secondary | ICD-10-CM | POA: Diagnosis not present

## 2024-03-08 LAB — CBC WITH DIFFERENTIAL (CANCER CENTER ONLY)
Abs Immature Granulocytes: 0.02 K/uL (ref 0.00–0.07)
Basophils Absolute: 0 K/uL (ref 0.0–0.1)
Basophils Relative: 1 %
Eosinophils Absolute: 0.1 K/uL (ref 0.0–0.5)
Eosinophils Relative: 2 %
HCT: 31.4 % — ABNORMAL LOW (ref 36.0–46.0)
Hemoglobin: 10.6 g/dL — ABNORMAL LOW (ref 12.0–15.0)
Immature Granulocytes: 1 %
Lymphocytes Relative: 39 %
Lymphs Abs: 1.2 K/uL (ref 0.7–4.0)
MCH: 32.6 pg (ref 26.0–34.0)
MCHC: 33.8 g/dL (ref 30.0–36.0)
MCV: 96.6 fL (ref 80.0–100.0)
Monocytes Absolute: 0.6 K/uL (ref 0.1–1.0)
Monocytes Relative: 20 %
Neutro Abs: 1.1 K/uL — ABNORMAL LOW (ref 1.7–7.7)
Neutrophils Relative %: 37 %
Platelet Count: 130 K/uL — ABNORMAL LOW (ref 150–400)
RBC: 3.25 MIL/uL — ABNORMAL LOW (ref 3.87–5.11)
RDW: 15.8 % — ABNORMAL HIGH (ref 11.5–15.5)
WBC Count: 3 K/uL — ABNORMAL LOW (ref 4.0–10.5)
nRBC: 0 % (ref 0.0–0.2)

## 2024-03-08 LAB — CMP (CANCER CENTER ONLY)
ALT: 15 U/L (ref 0–44)
AST: 21 U/L (ref 15–41)
Albumin: 4.5 g/dL (ref 3.5–5.0)
Alkaline Phosphatase: 82 U/L (ref 38–126)
Anion gap: 11 (ref 5–15)
BUN: 47 mg/dL — ABNORMAL HIGH (ref 8–23)
CO2: 21 mmol/L — ABNORMAL LOW (ref 22–32)
Calcium: 9.3 mg/dL (ref 8.9–10.3)
Chloride: 109 mmol/L (ref 98–111)
Creatinine: 2.51 mg/dL — ABNORMAL HIGH (ref 0.44–1.00)
GFR, Estimated: 18 mL/min — ABNORMAL LOW (ref >60)
Glucose, Bld: 87 mg/dL (ref 70–99)
Potassium: 4.1 mmol/L (ref 3.5–5.1)
Sodium: 140 mmol/L (ref 135–145)
Total Bilirubin: 0.4 mg/dL (ref 0.0–1.2)
Total Protein: 6.2 g/dL — ABNORMAL LOW (ref 6.5–8.1)

## 2024-03-08 LAB — SAMPLE TO BLOOD BANK

## 2024-03-08 MED ORDER — SODIUM CHLORIDE 0.9 % IV SOLN
375.0000 mg/m2 | Freq: Once | INTRAVENOUS | Status: AC
  Administered 2024-03-08: 600 mg via INTRAVENOUS
  Filled 2024-03-08: qty 50

## 2024-03-08 MED ORDER — DEXAMETHASONE SOD PHOSPHATE PF 10 MG/ML IJ SOLN
10.0000 mg | Freq: Once | INTRAMUSCULAR | Status: AC
  Administered 2024-03-08: 10 mg via INTRAVENOUS

## 2024-03-08 MED ORDER — ACETAMINOPHEN 325 MG PO TABS
650.0000 mg | ORAL_TABLET | Freq: Once | ORAL | Status: AC
  Administered 2024-03-08: 650 mg via ORAL
  Filled 2024-03-08: qty 2

## 2024-03-08 MED ORDER — SODIUM CHLORIDE 0.9 % IV SOLN: INTRAVENOUS | Status: DC

## 2024-03-08 MED ORDER — DIPHENHYDRAMINE HCL 25 MG PO CAPS
50.0000 mg | ORAL_CAPSULE | Freq: Once | ORAL | Status: AC
  Administered 2024-03-08: 50 mg via ORAL
  Filled 2024-03-08: qty 2

## 2024-03-08 MED ORDER — SODIUM CHLORIDE 0.9 % IV SOLN
1.4000 mg/kg | Freq: Once | INTRAVENOUS | Status: AC
  Administered 2024-03-08: 90 mg via INTRAVENOUS
  Filled 2024-03-08: qty 4.5

## 2024-03-08 NOTE — Patient Instructions (Signed)
 [facility] - A DEPT OF [facility]  Discharge Instructions: Thank you for choosing [facility] to provide your oncology and hematology care.   If you have a lab appointment with the Cancer Center, please go directly to the Cancer Center and check in at the registration area.   Wear comfortable clothing and clothing appropriate for easy access to any Portacath or PICC line.   We strive to give you quality time with your provider. You may need to reschedule your appointment if you arrive late (15 or more minutes).  Arriving late affects you and other patients whose appointments are after yours.  Also, if you miss three or more appointments without notifying the office, you may be dismissed from the clinic at the provider's discretion.      For prescription refill requests, have your pharmacy contact our office and allow 72 hours for refills to be completed.    Today you received the following chemotherapy and/or immunotherapy agents: Rituximab -pvvr (Ruxience ) & Polatuzumab vedotin -piiq (Polivy )    To help prevent nausea and vomiting after your treatment, we encourage you to take your nausea medication as directed.  BELOW ARE SYMPTOMS THAT SHOULD BE REPORTED IMMEDIATELY: *FEVER GREATER THAN 100.4 F (38 C) OR HIGHER *CHILLS OR SWEATING *NAUSEA AND VOMITING THAT IS NOT CONTROLLED WITH YOUR NAUSEA MEDICATION *UNUSUAL SHORTNESS OF BREATH *UNUSUAL BRUISING OR BLEEDING *URINARY PROBLEMS (pain or burning when urinating, or frequent urination) *BOWEL PROBLEMS (unusual diarrhea, constipation, pain near the anus) TENDERNESS IN MOUTH AND THROAT WITH OR WITHOUT PRESENCE OF ULCERS (sore throat, sores in mouth, or a toothache) UNUSUAL RASH, SWELLING OR PAIN  UNUSUAL VAGINAL DISCHARGE OR ITCHING   Items with * indicate a potential emergency and should be followed up as soon as possible or go to the Emergency Department if any problems should occur.  Please  show the CHEMOTHERAPY ALERT CARD or IMMUNOTHERAPY ALERT CARD at check-in to the Emergency Department and triage nurse.  Should you have questions after your visit or need to cancel or reschedule your appointment, please contact [facility] - A DEPT OF [facility]  Dept: [phone]  and follow the prompts.  Office hours are 8:00 a.m. to 4:30 p.m. Monday - Friday. Please note that voicemails left after 4:00 p.m. may not be returned until the following business day.  We are closed weekends and major holidays. You have access to a nurse at all times for urgent questions. Please call the main number to the clinic Dept: [phone] and follow the prompts.   For any non-urgent questions, you may also contact your provider using MyChart. We now offer e-Visits for anyone 74 and older to request care online for non-urgent symptoms. For details visit [email or web address].   Also download the MyChart app! Go to the app store, search MyChart, open the app, select , and log in with your MyChart username and password.

## 2024-03-08 NOTE — Progress Notes (Signed)
 HEMATOLOGY ONCOLOGY PROGRESS NOTE  Date of service: 03/08/2024  Patient Care Team: Ngetich, Roxan BROCKS, NP as PCP - General (Family Medicine) Kate Lonni CROME, MD as PCP - Cardiology (Cardiology) Arlana Arnt, MD as Consulting Physician (Otolaryngology) Luis Purchase, MD as Consulting Physician (Gastroenterology) Raeanne, Shanda SQUIBB, MD (Inactive) as Consulting Physician (Obstetrics and Gynecology) Roz Anes, MD as Consulting Physician (Ophthalmology) Quinn Odor, [facility] (Inactive) as Pharmacist (Pharmacist) Onesimo Emaline Brink, MD as Consulting Physician (Hematology)  CHIEF COMPLAINT/PURPOSE OF CONSULTATION: Follow-up for continued evaluation and management of  CLL with Richter's transformation.   HISTORY OF PRESENTING ILLNESS:   (05/11/2022) Jacqueline Hancock is a wonderful 83 y.o. female who is here for continued evaluation and management of NHL, unspecified body region. Patient was following up with Dr. Amadeo.    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 BM Bx Cyclin D1 IHC +ve byt FISH neg . She has presented with splenomegaly, anemia, and lymphocytosis. 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    Patient's current treatment is Imbruvica  280 mg. She was started on Imbruvica  560 mg on January 2022, which was then reduced to 280 mg in July 2023.    Patient was last seen by Dr. Amadeo on 02/10/2022 and she was doing well overall.    Patient reports she has been doing well overall since our last visit. She does complains of itchy skin rashes, mostly around the face and head, for around 2-3 weeks ago. Patient notes that she was started couple of new medications from other physicians, but she is unsure of the names. Patient notes that she was started on oxycodone  around 3-4 weeks ago. She discontinued oxycodone .    She notes that there is redness around her face, which is not new.     Patient is currently taking Imbruvica  280 mg as prescribed and has been tolerating it well without any severe toxicities.    She is currently taking Iron supplement, calcium supplement, and Vitamin-D supplement.    She denies fever, chills, night sweats, unexpected weight loss, back pain, abdominal pain, chest pain, or leg swelling. Patient does complain of occasional abdominal pain.    Patient notes she had a surgery near her right neck/ear area to remove an enlarged lymph node couple years ago. She complains of mild occasional pain near the site.  SUMMARY OF ONCOLOGIC HISTORY: Oncology History  Diffuse large B-cell lymphoma of lymph nodes of multiple regions (HCC)  08/18/2023 Initial Diagnosis   Diffuse large B-cell lymphoma of lymph nodes of multiple regions (HCC)   08/23/2023 - 10/07/2023 Chemotherapy   Patient is on Treatment Plan : NON-HODGKINS LYMPHOMA R-CHOP q21d     12/15/2023 -  Chemotherapy   Patient is on Treatment Plan : NON-HODGKINS LYMPHOMA RELAPSED/ REFRACTORY DLBCL Polatuzumab D1 + Bendamustine D1,2 + Rituximab  D1 q21d x 6 cycles      Cycle 5 of Polatuzumab D1 + Bendamustine D1,2 + Rituximab  D1 q21d x 6 cycles   INTERVAL HISTORY:  Jacqueline Hancock is a 83 y.o. female who is here today for continued evaluation and management of CLL with Richter's Transformation. accompanied by son and ambulating with a wheelchair.  she was last seen by me on 02/16/2024; at the time she did not have any concerns and was doing well.   Today, she says that for the past week she's been experiencing mild intermittent pain on her left side, noticeable with breathing or with pressure  applied. However, denies pain on palpation to area. She describes this as a gas pain, but has been having bowel movements daily and these have been mostly normal, except for this morning they were slightly loose. She does hydrate well, drinking about 64 oz daily, and eats well without triggering pain. Notably, she has been  more active this past week while she's been getting her house ready for Christmas.  Additionally, she's been noticing that when she eats/drinks she experiences wetness on the right side of her face. 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 She states that she notices this when consuming hot liquids/foods.   Denies any discomfort passing urine, fevers/chills, drenching night sweats, chest pain, new lumps/bumps, leg swelling, SOB.  REVIEW OF SYSTEMS:   10 Point review of systems of done and is negative except as noted above.  MEDICAL HISTORY Past Medical History:  Diagnosis Date   CLL (chronic lymphocytic leukemia) (HCC)    Congestive heart failure (CHF) (HCC) 09/2023   Dyspnea 2021   with exersion    Early cataracts, bilateral    Elevated hemoglobin A1c    Fibroids    GERD (gastroesophageal reflux disease)    Hyperlipidemia    Hypertension    Lymphoma (HCC)    Osteopenia    Personal history of radiation therapy    11/15/2023 - 11/21/2023 Abdomen- Dr. Lauraine Golden    SURGICAL HISTORY Past Surgical History:  Procedure Laterality Date   ABDOMINAL HYSTERECTOMY     APPENDECTOMY     BALLOON DILATION N/A 08/31/2023   Procedure: BALLOON DILATION;  Surgeon: Charlanne Groom, MD;  Location: [facility];  Service: Gastroenterology;  Laterality: N/A;   CATARACT EXTRACTION, BILATERAL Bilateral 09/2017   Dr. 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   TONSILLECTOMY AND ADENOIDECTOMY      SOCIAL HISTORY Social History   Tobacco Use   Smoking status: Never    Passive exposure: Past   Smokeless tobacco: Never  Vaping Use   Vaping status: Never Used  Substance Use Topics   Alcohol  use: No   Drug use: Never    Social History   Social History Narrative   Not on file    SOCIAL DRIVERS OF HEALTH SDOH Screenings   Food Insecurity: No Food Insecurity (11/13/2023)  Housing: Low Risk  (11/13/2023)  Transportation Needs: No Transportation Needs (11/13/2023)  Utilities: Not At Risk (11/13/2023)  Alcohol Screen: Low Risk  (10/11/2023)  Depression (PHQ2-9): Low Risk  (01/26/2024)  Financial Resource Strain: Low Risk  (10/11/2023)  Social Connections: Socially Isolated (11/13/2023)  Tobacco Use: Low Risk  (02/08/2024)  Health Literacy: Adequate Health Literacy (02/16/2023)     FAMILY HISTORY Family History  Problem Relation Age of Onset   Leukemia Mother    Hypertension Father    Diabetes Sister    Heart disease Brother    Diabetes Brother    Diabetes Brother    Hypertension Son    Heart disease Son      ALLERGIES: is allergic to peanut (diagnostic) and nexium [esomeprazole magnesium ].  MEDICATIONS  Current Outpatient Medications  Medication Sig Dispense Refill   acyclovir  (ZOVIRAX ) 400 MG tablet Take 1 tablet (400 mg total) by mouth 2 (two) times daily. 60  tablet 5   allopurinol  (ZYLOPRIM ) 300 MG tablet Take 0.5 tablets (150 mg total) by mouth daily. 30 tablet 1   aspirin  81 MG chewable tablet Chew 1 tablet (81 mg total) by mouth daily. 90 tablet 3   dexamethasone  (DECADRON ) 4 MG tablet Take 2 tablets (8 mg total) by mouth daily. Start the day after bendamustine chemotherapy for 2 days. Take with food. 30 tablet 1   entecavir  (BARACLUDE ) 0.5 MG tablet Take 1 tablet (0.5 mg total) by mouth every 3 (three) days. (Patient not taking: Reported on 02/08/2024) 30 tablet 2   JARDIANCE  10 MG TABS tablet Take 1 tablet (10 mg total) by mouth daily before breakfast. 90 tablet 3   lidocaine -prilocaine  (EMLA ) cream Apply to affected area once 30 g 3   magnesium  oxide (MAG-OX) 400 (240 Mg) MG tablet Take 1 tablet (400 mg total) by mouth daily. (Patient not taking: Reported on  02/08/2024) 30 tablet 0   ondansetron  (ZOFRAN ) 8 MG tablet Take 8 mg by mouth every 8 (eight) hours as needed for nausea or vomiting.     ondansetron  (ZOFRAN ) 8 MG tablet Take 1 tablet (8 mg total) by mouth every 8 (eight) hours as needed for nausea or vomiting. Start on the third day after chemotherapy. (Patient not taking: Reported on 02/08/2024) 30 tablet 1   pantoprazole  (PROTONIX ) 40 MG tablet Take 1 tablet (40 mg total) by mouth 2 (two) times daily for 56 days, THEN 1 tablet (40 mg total) daily. (Patient taking differently: Take 1 tablet (40mg ) by mouth twice daily.) 142 tablet 0   polyethylene glycol (MIRALAX  / GLYCOLAX ) 17 g packet Take 17 g by mouth daily as needed for mild constipation or moderate constipation. Also available OTC 30 each 0   potassium chloride  SA (KLOR-CON  M) 20 MEQ tablet Take 1 tablet (20 mEq total) by mouth daily. Take twice a day for 3 days then once daily (Patient not taking: Reported on 02/08/2024) 60 tablet 0   prochlorperazine  (COMPAZINE ) 10 MG tablet Take 1 tablet (10 mg total) by mouth every 6 (six) hours as needed for nausea or vomiting. 30 tablet 1   torsemide  (DEMADEX ) 20 MG tablet Take 1 tablet (20 mg total) by mouth as needed. For weight gain of 3 lb in 24 hours or 5 lb in a week. 90 tablet 1   traMADol  (ULTRAM ) 50 MG tablet Take 1 tablet (50 mg total) by mouth every 8 (eight) hours as needed for moderate pain (pain score 4-6) or severe pain (pain score 7-10) (pain). 20 tablet 0   No current facility-administered medications for this visit.    PHYSICAL EXAMINATION: ECOG PERFORMANCE STATUS: 1 - Symptomatic but completely ambulatory VITALS: Vitals:   03/08/24 0932 03/08/24 1031  BP: 134/66   Pulse: 72   Resp: 17   Temp:  97.6 F (36.4 C)  SpO2: 98%    Filed Weights   03/08/24 1031  Weight: 128 lb (58.1 kg)   Body mass index is 24.19 kg/m.  GENERAL: alert, in no acute distress and comfortable SKIN: no acute rashes, no significant lesions EYES:  conjunctiva are pink and non-injected, sclera anicteric OROPHARYNX: MMM, no exudates, no oropharyngeal erythema or ulceration NECK: supple, no JVD LYMPH:  no palpable lymphadenopathy in the cervical, axillary or inguinal regions LUNGS: clear to auscultation b/l with normal respiratory effort HEART: regular rate & rhythm ABDOMEN:  normoactive bowel sounds , non tender, not distended, no hepatosplenomegaly, (-) no pain to palpation to left side.  Extremity:  no pedal edema PSYCH: alert & oriented x 3 with fluent speech NEURO: no focal motor/sensory deficits  LABORATORY DATA:   I have reviewed the data as listed     Latest Ref Rng & Units 03/08/2024    9:13 AM 02/16/2024   10:43 AM 01/26/2024    9:52 AM  CBC EXTENDED  WBC 4.0 - 10.5 K/uL 3.0  6.1  7.3   RBC 3.87 - 5.11 MIL/uL 3.25  2.96  3.25   Hemoglobin 12.0 - 15.0 g/dL 89.3  9.6  89.8   HCT 63.9 - 46.0 % 31.4  28.7  30.6   Platelets 150 - 400 K/uL 130  125  151   NEUT# 1.7 - 7.7 K/uL 1.1  4.3  4.6   Lymph# 0.7 - 4.0 K/uL 1.2  1.0  1.6       Latest Ref Rng & Units 03/08/2024    9:13 AM 02/16/2024   10:43 AM 01/26/2024    9:52 AM  CMP  Glucose 70 - 99 mg/dL 87  89  80   BUN 8 - 23 mg/dL 47  43  58   Creatinine 0.44 - 1.00 mg/dL 7.48  7.25  7.42   Sodium 135 - 145 mmol/L 140  140  138   Potassium 3.5 - 5.1 mmol/L 4.1  4.4  4.3   Chloride 98 - 111 mmol/L 109  110  108   CO2 22 - 32 mmol/L 21  23  25    Calcium 8.9 - 10.3 mg/dL 9.3  9.1  9.4   Total Protein 6.5 - 8.1 g/dL 6.2  5.7  6.0   Total Bilirubin 0.0 - 1.2 mg/dL 0.4  0.5  0.6   Alkaline Phos 38 - 126 U/L 82  78  72   AST 15 - 41 U/L 21  17  16    ALT 0 - 44 U/L 15  12  10       PATHOLOGY Surgical Pathology CASE: [id number] PATIENT: Mikyla Cheek Bone Marrow Report     Clinical History: lymphocytosis, possible lymphoma     DIAGNOSIS:  BONE MARROW, ASPIRATE, CLOT, CORE: -Hypercellular bone marrow with extensive involvement by a B-cell lymphoproliferative  disorder -See comment  PERIPHERAL BLOOD: -Macrocytic anemia -Marked lymphocytosis consistent with lymphoproliferative disorder  COMMENT:  There is prominent involvement by a B-cell lymphoproliferative process associated with cyclin D1 positivity and partial expression of CD5.  The latter is primarily seen by flow cytometry.  The findings favor mantle cell lymphoma but correlation with cytogenetic and FISH studies is recommended.  MICROSCOPIC DESCRIPTION:  PERIPHERAL BLOOD SMEAR: The red blood cells display prominent anisocytosis with macrocytic and normocytic cells.  There is mild to moderate poikilocytosis with teardrop cells, elliptocytes.  There is moderate polychromasia.  The white blood cells are increased in number with lymphocytosis characterized by predominance of small to medium sized lymphoid cells displaying high nuclear cytoplasmic ratio, round to irregular nuclei, coarse chromatin and small to inconspicuous nucleoli. The platelets are normal in number.        RADIOGRAPHIC STUDIES: I have personally reviewed the radiological images as listed and agreed with the findings in the report. NM PET Image Restag (PS) Skull Base To Thigh Result Date: 02/15/2024 CLINICAL DATA:  Subsequent treatment strategy for large B-cell lymphoma. EXAM: NUCLEAR MEDICINE PET SKULL BASE TO THIGH TECHNIQUE: 6.28 mCi F-18 FDG was injected intravenously. Full-ring PET imaging was performed from the skull base to thigh after the radiotracer. CT data was obtained and used for  attenuation correction and anatomic localization. Fasting blood glucose: 80 mg/dl COMPARISON:  CT of the chest, abdomen and pelvis 12/19/2023. PET-CT 10/19/2023. FINDINGS: Mediastinal blood pool activity: SUV max 2.4 Liver activity: SUV max 2.9 NECK: No hypermetabolic cervical lymph nodes are identified. No suspicious activity identified within the pharyngeal mucosal space. Incidental CT findings: none CHEST: There are no  hypermetabolic mediastinal, hilar or axillary lymph nodes. No hypermetabolic pulmonary activity or suspicious nodularity. Incidental CT findings: Right IJ Port-A-Cath extends into the upper right atrium. Mild aortic atherosclerosis. Stable calcified left lower lobe granuloma and interstitial lung disease. ABDOMEN/PELVIS: There is no hypermetabolic activity within the liver, adrenal glands, spleen or pancreas. Previously demonstrated large, partially calcified right retroperitoneal mass involving the right kidney shows further decrease in size, now measuring approximately 0.3 x 4.9 cm on image 99/4 (9.5 x 6.5 cm on most recent CT). There is residual low level metabolic activity within this mass, some of which may reflect collecting system activity in the kidney (currently with an SUV max of 6.1, previously 21.8). No other more hypermetabolic or enlarging lymph nodes are identified within the abdomen or pelvis. Specifically, the previously demonstrated hypermetabolic right pelvic sidewall and right inguinal adenopathy has resolved. The enlarging lymph nodes seen on the most recent CT have all significantly improved, without significant residual hypermetabolic activity. Incidental CT findings: Aortic and branch vessel atherosclerosis. No evidence of hydronephrosis, bowel obstruction or ascites. SKELETON: There is no hypermetabolic activity to suggest osseous metastatic disease. Incidental CT findings: Mild spondylosis. IMPRESSION: 1. Interval response to therapy with further decrease in size of the large right retroperitoneal mass involving the right kidney. There is residual low level metabolic activity within this mass, some of which may reflect collecting system activity in the kidney. Deauville 3/4. 2. Interval resolution of the enlarged abdominopelvic lymph nodes on recent diagnostic CT, without hypermetabolic activity. Deauville 1. 3. No new or progressive disease identified. 4.  Aortic Atherosclerosis  (ICD10-I70.0). Electronically Signed   By: Elsie Perone M.D.   On: 02/15/2024 16:26   ECHOCARDIOGRAM COMPLETE Result Date: 02/09/2024    ECHOCARDIOGRAM REPORT   Patient Name:   AVEN CEGIELSKI Date of Exam: 02/08/2024 Medical Rec #:  [id number]   Height:       61.0 in Accession #:    [id number]  Weight:       124.0 lb Date of Birth:  Dec 09, 1940   BSA:          1.541 m Patient Age:    83 years    BP:           143/63 mmHg Patient Gender: F           HR:           84 bpm. Exam Location:  Outpatient Procedure: 2D Echo (Both Spectral and Color Flow Doppler were utilized during            procedure). Indications:    CHF  History:        Patient has prior history of Echocardiogram examinations. CHF.  Sonographer:    Norleen Amour Referring Phys: [phone] ALMA L DIAZ IMPRESSIONS  1. Apical displacement of papillary muscle without associated mitral regurgitation and without HCM. Similar to prior studies. Left ventricular ejection fraction, by estimation, is 60 to 65%. The left ventricle has normal function. The left ventricle has  no regional wall motion abnormalities. Left ventricular diastolic parameters are consistent with Grade I diastolic dysfunction (impaired relaxation).  2. Right ventricular systolic function  is normal. The right ventricular size is normal. There is normal pulmonary artery systolic pressure.  3. The mitral valve is normal in structure. Trivial mitral valve regurgitation.  4. The aortic valve is tricuspid. Aortic valve regurgitation is not visualized.  5. Cannot exclude a small PFO. FINDINGS  Left Ventricle: Apical displacement of papillary muscle without associated mitral regurgitation and without HCM. Similar to prior studies. Left ventricular ejection fraction, by estimation, is 60 to 65%. The left ventricle has normal function. The left ventricle has no regional wall motion abnormalities. The left ventricular internal cavity size was normal in size. There is no left ventricular hypertrophy. Left  ventricular diastolic parameters are consistent with Grade I diastolic dysfunction (impaired  relaxation). Right Ventricle: The right ventricular size is normal. No increase in right ventricular wall thickness. Right ventricular systolic function is normal. There is normal pulmonary artery systolic pressure. The tricuspid regurgitant velocity is 2.69 m/s, and  with an assumed right atrial pressure of 3 mmHg, the estimated right ventricular systolic pressure is 31.9 mmHg. Left Atrium: Left atrial size was normal in size. Right Atrium: Right atrial size was normal in size. Pericardium: There is no evidence of pericardial effusion. Mitral Valve: The mitral valve is normal in structure. Trivial mitral valve regurgitation. MV peak gradient, 5.0 mmHg. The mean mitral valve gradient is 2.0 mmHg. Tricuspid Valve: The tricuspid valve is normal in structure. Tricuspid valve regurgitation is trivial. Aortic Valve: The aortic valve is tricuspid. Aortic valve regurgitation is not visualized. Aortic valve mean gradient measures 4.0 mmHg. Aortic valve peak gradient measures 7.0 mmHg. Aortic valve area, by VTI measures 3.08 cm. Pulmonic Valve: The pulmonic valve was normal in structure. Pulmonic valve regurgitation is not visualized. Aorta: The aortic root and ascending aorta are structurally normal, with no evidence of dilitation. IAS/Shunts: Cannot exclude a small PFO.  LEFT VENTRICLE PLAX 2D LVIDd:         4.50 cm     Diastology LVIDs:         2.90 cm     LV e' medial:    6.75 cm/s LV PW:         0.90 cm     LV E/e' medial:  9.9 LV IVS:        0.70 cm     LV e' lateral:   8.06 cm/s LVOT diam:     2.00 cm     LV E/e' lateral: 8.3 LV SV:         68 LV SV Index:   44 LVOT Area:     3.14 cm LV IVRT:       74 msec  LV Volumes (MOD) LV vol d, MOD A2C: 37.1 ml LV vol d, MOD A4C: 66.7 ml LV vol s, MOD A2C: 17.8 ml LV vol s, MOD A4C: 24.1 ml LV SV MOD A2C:     19.3 ml LV SV MOD A4C:     66.7 ml LV SV MOD BP:      28.7 ml RIGHT  VENTRICLE             IVC RV Basal diam:  2.50 cm     IVC diam: 0.60 cm RV S prime:     17.20 cm/s TAPSE (M-mode): 2.2 cm      PULMONARY VEINS                             Diastolic Velocity: 45.60 cm/s  S/D Velocity:       1.40                             Systolic Velocity:  62.10 cm/s LEFT ATRIUM             Index        RIGHT ATRIUM          Index LA diam:        3.00 cm 1.95 cm/m   RA Area:     8.74 cm LA Vol (A2C):   34.3 ml 22.25 ml/m  RA Volume:   16.00 ml 10.38 ml/m LA Vol (A4C):   25.2 ml 16.35 ml/m LA Biplane Vol: 30.5 ml 19.79 ml/m  AORTIC VALVE                    PULMONIC VALVE AV Area (Vmax):    2.95 cm     PV Vmax:       0.85 m/s AV Area (Vmean):   2.77 cm     PV Peak grad:  2.9 mmHg AV Area (VTI):     3.08 cm AV Vmax:           132.00 cm/s AV Vmean:          94.600 cm/s AV VTI:            0.219 m AV Peak Grad:      7.0 mmHg AV Mean Grad:      4.0 mmHg LVOT Vmax:         124.00 cm/s LVOT Vmean:        83.300 cm/s LVOT VTI:          0.215 m LVOT/AV VTI ratio: 0.98  AORTA Ao Root diam: 2.90 cm Ao Asc diam:  2.70 cm MITRAL VALVE                TRICUSPID VALVE MV Area (PHT): 4.01 cm     TR Peak grad:   28.9 mmHg MV Area VTI:   3.14 cm     TR Vmax:        269.00 cm/s MV Peak grad:  5.0 mmHg MV Mean grad:  2.0 mmHg     SHUNTS MV Vmax:       1.12 m/s     Systemic VTI:  0.22 m MV Vmean:      56.9 cm/s    Systemic Diam: 2.00 cm MV Decel Time: 189 msec MV E velocity: 66.50 cm/s MV A velocity: 101.00 cm/s MV E/A ratio:  0.66 Stanly Leavens MD Electronically signed by Stanly Leavens MD Signature Date/Time: 02/09/2024/5:33:39 AM    Final    CT CHEST ABDOMEN PELVIS WO CONTRAST Result Date: 12/21/2023 CLINICAL DATA:  Hematologic malignancy, assess treatment response, large B-cell lymphoma EXAM: CT CHEST, ABDOMEN AND PELVIS WITHOUT CONTRAST TECHNIQUE: Multidetector CT imaging of the chest, abdomen and pelvis was performed following the standard protocol without IV  contrast. RADIATION DOSE REDUCTION: This exam was performed according to the departmental dose-optimization program which includes automated exposure control, adjustment of the mA and/or kV according to patient size and/or use of iterative reconstruction technique. COMPARISON:  PET-CT, 10/19/2023 FINDINGS: CT CHEST FINDINGS Cardiovascular: Right chest port catheter. Normal heart size. No pericardial effusion. Mediastinum/Nodes: No enlarged mediastinal, hilar, or axillary lymph nodes. Thyroid  gland, trachea, and esophagus demonstrate no significant findings. 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 No pleural effusion or pneumothorax. Musculoskeletal: No chest wall abnormality. No acute osseous findings. CT ABDOMEN PELVIS FINDINGS Hepatobiliary: No solid liver abnormality is seen. No gallstones, gallbladder wall thickening, or biliary dilatation. Pancreas: Unremarkable. No pancreatic ductal dilatation or surrounding inflammatory changes. Spleen: Normal in size without significant abnormality. Adrenals/Urinary Tract: Adrenal glands are unremarkable. Diminished size of a large soft tissue mass encasing the right kidney and proximal right ureter, on today's examination measuring 9.5 x 6.5 cm, previously 12.9 x 10.0 cm (series 2, image 62). New, mild left hydronephrosis and proximal hydroureter, no obstructing etiology directly visualized in the distal ureter. Bladder is unremarkable. Stomach/Bowel: Stomach is within normal limits. Appendix appears normal. No evidence of bowel wall thickening, distention, or inflammatory changes. Large burden of stool in the colon. Vascular/Lymphatic: Aortic atherosclerosis. Multiple new bulky lymph nodes, including in the central small bowel mesentery measuring 5.5 x 3.2 cm (series 2, image 71) and in the bilateral pelvic sidewalls and  inguinal stations, large right pelvic sidewall node measuring 4.5 x 2.3 cm (series 2, image 94). Reproductive: Hysterectomy. Other: No abdominal wall hernia or abnormality. No ascites. Musculoskeletal: No acute osseous findings. IMPRESSION: 1. Multiple new bulky lymph nodes, including in the central small bowel mesentery, bilateral pelvic sidewalls and inguinal stations, consistent with worsened lymphoma. 2. Diminished size of a large soft tissue mass encasing the right kidney and proximal right ureter. 3. New, mild left hydronephrosis and proximal hydroureter, no calculus or other obstructing etiology directly visualized in the distal ureter. 4. See separately reported examination of the chest for discussion of fibrotic interstitial lung disease. Aortic Atherosclerosis (ICD10-I70.0). Electronically Signed   By: Marolyn JONETTA Jaksch M.D.   On: 12/21/2023 07:42   CT Chest High Resolution Result Date: 12/21/2023 CLINICAL DATA:  Diffuse interstitial lung disease positive dsDNA, ENA, additional history large B-cell lymphoma * Tracking Code: BO * EXAM: CT CHEST WITHOUT CONTRAST TECHNIQUE: Multidetector CT imaging of the chest was performed following the standard protocol without intravenous contrast. High resolution imaging of the lungs, as well as inspiratory and expiratory imaging, was performed. RADIATION DOSE REDUCTION: This exam was performed according to the departmental dose-optimization program which includes automated exposure control, adjustment of the mA and/or kV according to patient size and/or use of iterative reconstruction technique. COMPARISON:  PET-CT, 10/19/2023 CT chest, 12/02/2022 FINDINGS: Cardiovascular: Right chest port catheter normal heart size. No pericardial effusion. Mediastinum/Nodes: No enlarged mediastinal, hilar, or axillary lymph nodes. Thyroid  gland, trachea, and esophagus demonstrate no significant findings. Lungs/Pleura: Unchanged mild pulmonary fibrosis in a pattern with apical to basal  gradient, featuring a preponderance of irregular ground-glass as well as peribronchovascular and peripheral irregular interstitial opacity and septal thickening. No significant air trapping on expiratory phase imaging. No pleural effusion or pneumothorax. Upper Abdomen: No acute abnormality. Musculoskeletal: No chest wall abnormality. No acute osseous findings. IMPRESSION: 1. Unchanged mild pulmonary fibrosis in a pattern with apical to basal gradient, featuring a preponderance of irregular ground-glass as well as peribronchovascular and peripheral irregular interstitial opacity and septal thickening. No significant air trapping on expiratory phase imaging. Findings are suggestive of an alternative diagnosis (not UIP) per consensus guidelines. In this clinical setting chronic sequelae of drug toxicity would be a substantial differential consideration, and given report of abnormal titers connective tissue disorder associated interstitial lung disease would also be a significant consideration. Findings are suggestive of an alternative diagnosis (not UIP) per consensus guidelines: Diagnosis of Idiopathic Pulmonary  Fibrosis: An Official ATS/ERS/JRS/ALAT Clinical Practice Guideline. Am JINNY [employer] Crit Care Med Vol 198, Iss 5, [id number], Dec 03 2016. 2. No evidence of lymphadenopathy or metastatic disease in the chest. Electronically Signed   By: Marolyn JONETTA Jaksch M.D.   On: 12/21/2023 07:36    ASSESSMENT & PLAN:  83 y.o. female with  1.  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 Though discussed with patient that FISH was neg and so Mantle cell lymphoma not confirm. Findings more consistent with CLL.   2.  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   3.  High risk of tumor lysis syndrome   4.  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   5) history of hypercalcemia - ? From dehydration vs recurrent lymphoma   6) Hepatitis B chronic active disease  PLAN: - Discussed lab results on 03/08/2024 in detail with patient: CBC showed WBC of 3.0K decreased from 6.1K, Hemoglobin of 10.6 increased from 9.6, and PLTs of 130K. increased from 125K. CMP with Creatinine 2.51 decreased from 2.74.   - Suggest starting daily warm compresses and Miralax  for management of abdominal pain and potential constipation.   - If no active disease on next PET scan, then will consider starting Venetoclax + BTKi maintainence  - Regarding the episodes of sweating while consuming hot liquids, this is possibly a vasomotor reaction.  No notable toxicities from current Polatuzumab/Rituxan  treatment. Appropriate to proceed with C5 of treatment today with the same supportive medications.  No lab or clinical symptoms/signs suggestive of NHL progression at this time.  FOLLOW-UP F/u for C6 as scheduled  PETCT in 5 weeks RTC with Dr Onesimo with portflush and labs in 6 weeks.  The total time spent in the appointment was 32 minutes* .  All of the patient's questions were answered and the patient knows to call the clinic with any problems, questions, or concerns.  Emaline Onesimo MD MS AAHIVMS [facility] Hematology/Oncology Physician [facility]  *Total Encounter Time as defined by the Centers for Medicare and Medicaid Services includes, in addition to the face-to-face time of a patient visit (documented in the note above) non-face-to-face time: obtaining and reviewing outside history, ordering and reviewing medications, tests or procedures, care coordination (communications with other health care professionals or caregivers) and documentation in the medical record.  I,Emily Lagle,acting as a neurosurgeon for Emaline Onesimo, MD.,have documented all relevant documentation on the behalf of Emaline Onesimo, MD,as  directed by  Emaline Onesimo, MD while in the presence of Emaline Onesimo, MD.  I have reviewed the above documentation for accuracy and completeness, and I agree with the above.  Breslyn Abdo, MD

## 2024-03-11 ENCOUNTER — Inpatient Hospital Stay

## 2024-03-11 VITALS — BP 171/82 | HR 76 | Temp 98.6°F | Resp 17

## 2024-03-11 DIAGNOSIS — C9110 Chronic lymphocytic leukemia of B-cell type not having achieved remission: Secondary | ICD-10-CM | POA: Diagnosis not present

## 2024-03-11 DIAGNOSIS — C8338 Diffuse large B-cell lymphoma, lymph nodes of multiple sites: Secondary | ICD-10-CM

## 2024-03-11 MED ORDER — PEGFILGRASTIM-CBQV 6 MG/0.6ML ~~LOC~~ SOSY
6.0000 mg | PREFILLED_SYRINGE | Freq: Once | SUBCUTANEOUS | Status: AC
  Administered 2024-03-11: 6 mg via SUBCUTANEOUS
  Filled 2024-03-11: qty 0.6

## 2024-03-14 ENCOUNTER — Encounter: Payer: Self-pay | Admitting: Hematology

## 2024-03-27 ENCOUNTER — Inpatient Hospital Stay

## 2024-03-27 ENCOUNTER — Inpatient Hospital Stay: Admitting: Physician Assistant

## 2024-03-27 VITALS — BP 149/72 | HR 84 | Temp 97.3°F | Resp 18 | Wt 128.4 lb

## 2024-03-27 DIAGNOSIS — R7989 Other specified abnormal findings of blood chemistry: Secondary | ICD-10-CM

## 2024-03-27 DIAGNOSIS — C8338 Diffuse large B-cell lymphoma, lymph nodes of multiple sites: Secondary | ICD-10-CM | POA: Diagnosis not present

## 2024-03-27 DIAGNOSIS — C9110 Chronic lymphocytic leukemia of B-cell type not having achieved remission: Secondary | ICD-10-CM | POA: Diagnosis not present

## 2024-03-27 LAB — CMP (CANCER CENTER ONLY)
ALT: 16 U/L (ref 0–44)
AST: 23 U/L (ref 15–41)
Albumin: 4.5 g/dL (ref 3.5–5.0)
Alkaline Phosphatase: 96 U/L (ref 38–126)
Anion gap: 12 (ref 5–15)
BUN: 51 mg/dL — ABNORMAL HIGH (ref 8–23)
CO2: 19 mmol/L — ABNORMAL LOW (ref 22–32)
Calcium: 9.3 mg/dL (ref 8.9–10.3)
Chloride: 106 mmol/L (ref 98–111)
Creatinine: 3.1 mg/dL — ABNORMAL HIGH (ref 0.44–1.00)
GFR, Estimated: 14 mL/min — ABNORMAL LOW
Glucose, Bld: 95 mg/dL (ref 70–99)
Potassium: 4.8 mmol/L (ref 3.5–5.1)
Sodium: 137 mmol/L (ref 135–145)
Total Bilirubin: 0.6 mg/dL (ref 0.0–1.2)
Total Protein: 6.5 g/dL (ref 6.5–8.1)

## 2024-03-27 LAB — SAMPLE TO BLOOD BANK

## 2024-03-27 LAB — CBC WITH DIFFERENTIAL (CANCER CENTER ONLY)
Abs Immature Granulocytes: 0.49 K/uL — ABNORMAL HIGH (ref 0.00–0.07)
Basophils Absolute: 0 K/uL (ref 0.0–0.1)
Basophils Relative: 1 %
Eosinophils Absolute: 0.1 K/uL (ref 0.0–0.5)
Eosinophils Relative: 4 %
HCT: 32.1 % — ABNORMAL LOW (ref 36.0–46.0)
Hemoglobin: 10.9 g/dL — ABNORMAL LOW (ref 12.0–15.0)
Immature Granulocytes: 15 %
Lymphocytes Relative: 24 %
Lymphs Abs: 0.8 K/uL (ref 0.7–4.0)
MCH: 32.8 pg (ref 26.0–34.0)
MCHC: 34 g/dL (ref 30.0–36.0)
MCV: 96.7 fL (ref 80.0–100.0)
Monocytes Absolute: 0.4 K/uL (ref 0.1–1.0)
Monocytes Relative: 11 %
Neutro Abs: 1.5 K/uL — ABNORMAL LOW (ref 1.7–7.7)
Neutrophils Relative %: 45 %
Platelet Count: 107 K/uL — ABNORMAL LOW (ref 150–400)
RBC: 3.32 MIL/uL — ABNORMAL LOW (ref 3.87–5.11)
RDW: 14.6 % (ref 11.5–15.5)
WBC Count: 3.4 K/uL — ABNORMAL LOW (ref 4.0–10.5)
nRBC: 0 % (ref 0.0–0.2)

## 2024-03-27 NOTE — Progress Notes (Unsigned)
 " HEMATOLOGY ONCOLOGY PROGRESS NOTE  Date of service: 03/27/2024  Patient Care Team: Ngetich, Roxan BROCKS, NP as PCP - General (Family Medicine) Kate Lonni CROME, MD as PCP - Cardiology (Cardiology) Arlana Arnt, MD as Consulting Physician (Otolaryngology) Luis Purchase, MD as Consulting Physician (Gastroenterology) Raeanne, Shanda SQUIBB, MD (Inactive) as Consulting Physician (Obstetrics and Gynecology) Roz Anes, MD as Consulting Physician (Ophthalmology) Quinn Odor, [facility] (Inactive) as Pharmacist (Pharmacist) Onesimo Emaline Brink, MD as Consulting Physician (Hematology)  CHIEF COMPLAINT/PURPOSE OF CONSULTATION: Follow-up for continued evaluation and management of  CLL with Richter's transformation.   HISTORY OF PRESENTING ILLNESS:   (05/11/2022) Jacqueline Hancock is a wonderful 83 y.o. female who is here for continued evaluation and management of NHL, unspecified body region. Patient was following up with Dr. Amadeo.    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 BM Bx Cyclin D1 IHC +ve byt FISH neg . She has presented with splenomegaly, anemia, and lymphocytosis. 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    Patient's current treatment is Imbruvica  280 mg. She was started on Imbruvica  560 mg on January 2022, which was then reduced to 280 mg in July 2023.    Patient was last seen by Dr. Amadeo on 02/10/2022 and she was doing well overall.    Patient reports she has been doing well overall since our last visit. She does complains of itchy skin rashes, mostly around the face and head, for around 2-3 weeks ago. Patient notes that she was started couple of new medications from other physicians, but she is unsure of the names. Patient notes that she was started on oxycodone  around 3-4 weeks ago. She discontinued oxycodone .    She notes that there is redness around her face, which is not new.     Patient is currently taking Imbruvica  280 mg as prescribed and has been tolerating it well without any severe toxicities.    She is currently taking Iron supplement, calcium supplement, and Vitamin-D supplement.    She denies fever, chills, night sweats, unexpected weight loss, back pain, abdominal pain, chest pain, or leg swelling. Patient does complain of occasional abdominal pain.    Patient notes she had a surgery near her right neck/ear area to remove an enlarged lymph node couple years ago. She complains of mild occasional pain near the site.  SUMMARY OF ONCOLOGIC HISTORY: Oncology History  Diffuse large B-cell lymphoma of lymph nodes of multiple regions (HCC)  08/18/2023 Initial Diagnosis   Diffuse large B-cell lymphoma of lymph nodes of multiple regions (HCC)   08/23/2023 - 10/07/2023 Chemotherapy   Patient is on Treatment Plan : NON-HODGKINS LYMPHOMA R-CHOP q21d     12/15/2023 -  Chemotherapy   Patient is on Treatment Plan : NON-HODGKINS LYMPHOMA RELAPSED/ REFRACTORY DLBCL Polatuzumab D1 + Bendamustine D1,2 + Rituximab  D1 q21d x 6 cycles      Cycle 5 of Polatuzumab D1 + Bendamustine D1,2 + Rituximab  D1 q21d x 6 cycles   INTERVAL HISTORY:  Jacqueline Hancock is a 83 y.o. female who is here today for continued evaluation and management of CLL with Richter's Transformation. accompanied by son and ambulating with a wheelchair.  she was last seen by me on 02/16/2024; at the time she did not have any concerns and was doing well.   Today, she says that for the past week she's been experiencing mild intermittent pain on her left side, noticeable with breathing or with  pressure applied. However, denies pain on palpation to area. She describes this as a gas pain, but has been having bowel movements daily and these have been mostly normal, except for this morning they were slightly loose. She does hydrate well, drinking about 64 oz daily, and eats well without triggering pain. Notably, she has been  more active this past week while she's been getting her house ready for Christmas.  Additionally, she's been noticing that when she eats/drinks she experiences wetness on the right side of her face. 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 She states that she notices this when consuming hot liquids/foods.   Denies any discomfort passing urine, fevers/chills, drenching night sweats, chest pain, new lumps/bumps, leg swelling, SOB.  REVIEW OF SYSTEMS:   10 Point review of systems of done and is negative except as noted above.  MEDICAL HISTORY Past Medical History:  Diagnosis Date   CLL (chronic lymphocytic leukemia) (HCC)    Congestive heart failure (CHF) (HCC) 09/2023   Dyspnea 2021   with exersion    Early cataracts, bilateral    Elevated hemoglobin A1c    Fibroids    GERD (gastroesophageal reflux disease)    Hyperlipidemia    Hypertension    Lymphoma (HCC)    Osteopenia    Personal history of radiation therapy    11/15/2023 - 11/21/2023 Abdomen- Dr. Lauraine Golden    SURGICAL HISTORY Past Surgical History:  Procedure Laterality Date   ABDOMINAL HYSTERECTOMY     APPENDECTOMY     BALLOON DILATION N/A 08/31/2023   Procedure: BALLOON DILATION;  Surgeon: Charlanne Groom, MD;  Location: [facility];  Service: Gastroenterology;  Laterality: N/A;   CATARACT EXTRACTION, BILATERAL Bilateral 09/2017   Dr. 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   TONSILLECTOMY AND ADENOIDECTOMY      SOCIAL HISTORY Social History   Tobacco Use   Smoking status: Never    Passive exposure: Past   Smokeless tobacco: Never  Vaping Use   Vaping status: Never Used  Substance Use Topics   Alcohol  use: No   Drug use: Never    Social History   Social History Narrative   Not on file    SOCIAL DRIVERS OF HEALTH SDOH Screenings   Food Insecurity: No Food Insecurity (11/13/2023)  Housing: Low Risk (11/13/2023)  Transportation Needs: No Transportation Needs (11/13/2023)  Utilities: Not At Risk (11/13/2023)  Alcohol Screen: Low Risk (10/11/2023)  Depression (PHQ2-9): Low Risk (03/27/2024)  Financial Resource Strain: Low Risk (10/11/2023)  Social Connections: Socially Isolated (11/13/2023)  Tobacco Use: Low Risk (02/08/2024)  Health Literacy: Adequate Health Literacy (02/16/2023)     FAMILY HISTORY Family History  Problem Relation Age of Onset   Leukemia Mother    Hypertension Father    Diabetes Sister    Heart disease Brother    Diabetes Brother    Diabetes Brother    Hypertension Son    Heart disease Son      ALLERGIES: is allergic to peanut (diagnostic) and nexium [esomeprazole magnesium ].  MEDICATIONS  Current Outpatient Medications  Medication Sig Dispense Refill   acyclovir  (ZOVIRAX ) 400 MG tablet Take 1 tablet (400 mg total) by mouth 2 (two) times daily. 60 tablet 5  allopurinol  (ZYLOPRIM ) 300 MG tablet Take 0.5 tablets (150 mg total) by mouth daily. 30 tablet 1   aspirin  81 MG chewable tablet Chew 1 tablet (81 mg total) by mouth daily. 90 tablet 3   dexamethasone  (DECADRON ) 4 MG tablet Take 2 tablets (8 mg total) by mouth daily. Start the day after bendamustine chemotherapy for 2 days. Take with food. 30 tablet 1   entecavir  (BARACLUDE ) 0.5 MG tablet Take 1 tablet (0.5 mg total) by mouth every 3 (three) days. 30 tablet 2   JARDIANCE  10 MG TABS tablet Take 1 tablet (10 mg total) by mouth daily before breakfast. 90 tablet 3   lidocaine -prilocaine  (EMLA ) cream Apply to affected area once 30 g 3   magnesium  oxide (MAG-OX) 400 (240 Mg) MG tablet Take 1 tablet (400 mg total) by mouth daily. 30 tablet 0   ondansetron  (ZOFRAN ) 8 MG tablet Take 8 mg by mouth every 8 (eight)  hours as needed for nausea or vomiting.     ondansetron  (ZOFRAN ) 8 MG tablet Take 1 tablet (8 mg total) by mouth every 8 (eight) hours as needed for nausea or vomiting. Start on the third day after chemotherapy. 30 tablet 1   pantoprazole  (PROTONIX ) 40 MG tablet Take 1 tablet (40 mg total) by mouth 2 (two) times daily for 56 days, THEN 1 tablet (40 mg total) daily. (Patient taking differently: Take 1 tablet (40mg ) by mouth twice daily.) 142 tablet 0   polyethylene glycol (MIRALAX  / GLYCOLAX ) 17 g packet Take 17 g by mouth daily as needed for mild constipation or moderate constipation. Also available OTC 30 each 0   potassium chloride  SA (KLOR-CON  M) 20 MEQ tablet Take 1 tablet (20 mEq total) by mouth daily. Take twice a day for 3 days then once daily 60 tablet 0   prochlorperazine  (COMPAZINE ) 10 MG tablet Take 1 tablet (10 mg total) by mouth every 6 (six) hours as needed for nausea or vomiting. 30 tablet 1   torsemide  (DEMADEX ) 20 MG tablet Take 1 tablet (20 mg total) by mouth as needed. For weight gain of 3 lb in 24 hours or 5 lb in a week. 90 tablet 1   traMADol  (ULTRAM ) 50 MG tablet Take 1 tablet (50 mg total) by mouth every 8 (eight) hours as needed for moderate pain (pain score 4-6) or severe pain (pain score 7-10) (pain). 20 tablet 0   No current facility-administered medications for this visit.    PHYSICAL EXAMINATION: ECOG PERFORMANCE STATUS: 1 - Symptomatic but completely ambulatory VITALS: Vitals:   03/27/24 0952  BP: (!) 149/72  Pulse: 84  Resp: 18  Temp: (!) 97.3 F (36.3 C)  SpO2: 100%   Filed Weights   03/27/24 0952  Weight: 128 lb 6.4 oz (58.2 kg)   Body mass index is 24.26 kg/m.  GENERAL: alert, in no acute distress and comfortable SKIN: no acute rashes, no significant lesions EYES: conjunctiva are pink and non-injected, sclera anicteric OROPHARYNX: MMM, no exudates, no oropharyngeal erythema or ulceration NECK: supple, no JVD LYMPH:  no palpable lymphadenopathy in  the cervical, axillary or inguinal regions LUNGS: clear to auscultation b/l with normal respiratory effort HEART: regular rate & rhythm ABDOMEN:  normoactive bowel sounds , non tender, not distended, no hepatosplenomegaly, (-) no pain to palpation to left side.  Extremity: no pedal edema PSYCH: alert & oriented x 3 with fluent speech NEURO: no focal motor/sensory deficits  LABORATORY DATA:   I have reviewed the data as listed  Latest Ref Rng & Units 03/08/2024    9:13 AM 02/16/2024   10:43 AM 01/26/2024    9:52 AM  CBC EXTENDED  WBC 4.0 - 10.5 K/uL 3.0  6.1  7.3   RBC 3.87 - 5.11 MIL/uL 3.25  2.96  3.25   Hemoglobin 12.0 - 15.0 g/dL 89.3  9.6  89.8   HCT 63.9 - 46.0 % 31.4  28.7  30.6   Platelets 150 - 400 K/uL 130  125  151   NEUT# 1.7 - 7.7 K/uL 1.1  4.3  4.6   Lymph# 0.7 - 4.0 K/uL 1.2  1.0  1.6       Latest Ref Rng & Units 03/08/2024    9:13 AM 02/16/2024   10:43 AM 01/26/2024    9:52 AM  CMP  Glucose 70 - 99 mg/dL 87  89  80   BUN 8 - 23 mg/dL 47  43  58   Creatinine 0.44 - 1.00 mg/dL 7.48  7.25  7.42   Sodium 135 - 145 mmol/L 140  140  138   Potassium 3.5 - 5.1 mmol/L 4.1  4.4  4.3   Chloride 98 - 111 mmol/L 109  110  108   CO2 22 - 32 mmol/L 21  23  25    Calcium 8.9 - 10.3 mg/dL 9.3  9.1  9.4   Total Protein 6.5 - 8.1 g/dL 6.2  5.7  6.0   Total Bilirubin 0.0 - 1.2 mg/dL 0.4  0.5  0.6   Alkaline Phos 38 - 126 U/L 82  78  72   AST 15 - 41 U/L 21  17  16    ALT 0 - 44 U/L 15  12  10       PATHOLOGY Surgical Pathology CASE: [id number] PATIENT: Genevieve Majka Bone Marrow Report     Clinical History: lymphocytosis, possible lymphoma     DIAGNOSIS:  BONE MARROW, ASPIRATE, CLOT, CORE: -Hypercellular bone marrow with extensive involvement by a B-cell lymphoproliferative disorder -See comment  PERIPHERAL BLOOD: -Macrocytic anemia -Marked lymphocytosis consistent with lymphoproliferative disorder  COMMENT:  There is prominent involvement by a B-cell  lymphoproliferative process associated with cyclin D1 positivity and partial expression of CD5.  The latter is primarily seen by flow cytometry.  The findings favor mantle cell lymphoma but correlation with cytogenetic and FISH studies is recommended.  MICROSCOPIC DESCRIPTION:  PERIPHERAL BLOOD SMEAR: The red blood cells display prominent anisocytosis with macrocytic and normocytic cells.  There is mild to moderate poikilocytosis with teardrop cells, elliptocytes.  There is moderate polychromasia.  The white blood cells are increased in number with lymphocytosis characterized by predominance of small to medium sized lymphoid cells displaying high nuclear cytoplasmic ratio, round to irregular nuclei, coarse chromatin and small to inconspicuous nucleoli. The platelets are normal in number.        RADIOGRAPHIC STUDIES: I have personally reviewed the radiological images as listed and agreed with the findings in the report. NM PET Image Restag (PS) Skull Base To Thigh Result Date: 02/15/2024 CLINICAL DATA:  Subsequent treatment strategy for large B-cell lymphoma. EXAM: NUCLEAR MEDICINE PET SKULL BASE TO THIGH TECHNIQUE: 6.28 mCi F-18 FDG was injected intravenously. Full-ring PET imaging was performed from the skull base to thigh after the radiotracer. CT data was obtained and used for attenuation correction and anatomic localization. Fasting blood glucose: 80 mg/dl COMPARISON:  CT of the chest, abdomen and pelvis 12/19/2023. PET-CT 10/19/2023. FINDINGS: Mediastinal blood pool activity: SUV max 2.4 Liver activity: SUV  max 2.9 NECK: No hypermetabolic cervical lymph nodes are identified. No suspicious activity identified within the pharyngeal mucosal space. Incidental CT findings: none CHEST: There are no hypermetabolic mediastinal, hilar or axillary lymph nodes. No hypermetabolic pulmonary activity or suspicious nodularity. Incidental CT findings: Right IJ Port-A-Cath extends into the upper right  atrium. Mild aortic atherosclerosis. Stable calcified left lower lobe granuloma and interstitial lung disease. ABDOMEN/PELVIS: There is no hypermetabolic activity within the liver, adrenal glands, spleen or pancreas. Previously demonstrated large, partially calcified right retroperitoneal mass involving the right kidney shows further decrease in size, now measuring approximately 0.3 x 4.9 cm on image 99/4 (9.5 x 6.5 cm on most recent CT). There is residual low level metabolic activity within this mass, some of which may reflect collecting system activity in the kidney (currently with an SUV max of 6.1, previously 21.8). No other more hypermetabolic or enlarging lymph nodes are identified within the abdomen or pelvis. Specifically, the previously demonstrated hypermetabolic right pelvic sidewall and right inguinal adenopathy has resolved. The enlarging lymph nodes seen on the most recent CT have all significantly improved, without significant residual hypermetabolic activity. Incidental CT findings: Aortic and branch vessel atherosclerosis. No evidence of hydronephrosis, bowel obstruction or ascites. SKELETON: There is no hypermetabolic activity to suggest osseous metastatic disease. Incidental CT findings: Mild spondylosis. IMPRESSION: 1. Interval response to therapy with further decrease in size of the large right retroperitoneal mass involving the right kidney. There is residual low level metabolic activity within this mass, some of which may reflect collecting system activity in the kidney. Deauville 3/4. 2. Interval resolution of the enlarged abdominopelvic lymph nodes on recent diagnostic CT, without hypermetabolic activity. Deauville 1. 3. No new or progressive disease identified. 4.  Aortic Atherosclerosis (ICD10-I70.0). Electronically Signed   By: Elsie Perone M.D.   On: 02/15/2024 16:26   ECHOCARDIOGRAM COMPLETE Result Date: 02/09/2024    ECHOCARDIOGRAM REPORT   Patient Name:   Jacqueline Hancock Date of  Exam: 02/08/2024 Medical Rec #:  [id number]   Height:       61.0 in Accession #:    [id number]  Weight:       124.0 lb Date of Birth:  20-Jan-1941   BSA:          1.541 m Patient Age:    83 years    BP:           143/63 mmHg Patient Gender: F           HR:           84 bpm. Exam Location:  Outpatient Procedure: 2D Echo (Both Spectral and Color Flow Doppler were utilized during            procedure). Indications:    CHF  History:        Patient has prior history of Echocardiogram examinations. CHF.  Sonographer:    Norleen Amour Referring Phys: [phone] ALMA L DIAZ IMPRESSIONS  1. Apical displacement of papillary muscle without associated mitral regurgitation and without HCM. Similar to prior studies. Left ventricular ejection fraction, by estimation, is 60 to 65%. The left ventricle has normal function. The left ventricle has  no regional wall motion abnormalities. Left ventricular diastolic parameters are consistent with Grade I diastolic dysfunction (impaired relaxation).  2. Right ventricular systolic function is normal. The right ventricular size is normal. There is normal pulmonary artery systolic pressure.  3. The mitral valve is normal in structure. Trivial mitral valve regurgitation.  4. The aortic valve  is tricuspid. Aortic valve regurgitation is not visualized.  5. Cannot exclude a small PFO. FINDINGS  Left Ventricle: Apical displacement of papillary muscle without associated mitral regurgitation and without HCM. Similar to prior studies. Left ventricular ejection fraction, by estimation, is 60 to 65%. The left ventricle has normal function. The left ventricle has no regional wall motion abnormalities. The left ventricular internal cavity size was normal in size. There is no left ventricular hypertrophy. Left ventricular diastolic parameters are consistent with Grade I diastolic dysfunction (impaired  relaxation). Right Ventricle: The right ventricular size is normal. No increase in right ventricular wall  thickness. Right ventricular systolic function is normal. There is normal pulmonary artery systolic pressure. The tricuspid regurgitant velocity is 2.69 m/s, and  with an assumed right atrial pressure of 3 mmHg, the estimated right ventricular systolic pressure is 31.9 mmHg. Left Atrium: Left atrial size was normal in size. Right Atrium: Right atrial size was normal in size. Pericardium: There is no evidence of pericardial effusion. Mitral Valve: The mitral valve is normal in structure. Trivial mitral valve regurgitation. MV peak gradient, 5.0 mmHg. The mean mitral valve gradient is 2.0 mmHg. Tricuspid Valve: The tricuspid valve is normal in structure. Tricuspid valve regurgitation is trivial. Aortic Valve: The aortic valve is tricuspid. Aortic valve regurgitation is not visualized. Aortic valve mean gradient measures 4.0 mmHg. Aortic valve peak gradient measures 7.0 mmHg. Aortic valve area, by VTI measures 3.08 cm. Pulmonic Valve: The pulmonic valve was normal in structure. Pulmonic valve regurgitation is not visualized. Aorta: The aortic root and ascending aorta are structurally normal, with no evidence of dilitation. IAS/Shunts: Cannot exclude a small PFO.  LEFT VENTRICLE PLAX 2D LVIDd:         4.50 cm     Diastology LVIDs:         2.90 cm     LV e' medial:    6.75 cm/s LV PW:         0.90 cm     LV E/e' medial:  9.9 LV IVS:        0.70 cm     LV e' lateral:   8.06 cm/s LVOT diam:     2.00 cm     LV E/e' lateral: 8.3 LV SV:         68 LV SV Index:   44 LVOT Area:     3.14 cm LV IVRT:       74 msec  LV Volumes (MOD) LV vol d, MOD A2C: 37.1 ml LV vol d, MOD A4C: 66.7 ml LV vol s, MOD A2C: 17.8 ml LV vol s, MOD A4C: 24.1 ml LV SV MOD A2C:     19.3 ml LV SV MOD A4C:     66.7 ml LV SV MOD BP:      28.7 ml RIGHT VENTRICLE             IVC RV Basal diam:  2.50 cm     IVC diam: 0.60 cm RV S prime:     17.20 cm/s TAPSE (M-mode): 2.2 cm      PULMONARY VEINS                             Diastolic Velocity: 45.60 cm/s                              S/D Velocity:  1.40                             Systolic Velocity:  62.10 cm/s LEFT ATRIUM             Index        RIGHT ATRIUM          Index LA diam:        3.00 cm 1.95 cm/m   RA Area:     8.74 cm LA Vol (A2C):   34.3 ml 22.25 ml/m  RA Volume:   16.00 ml 10.38 ml/m LA Vol (A4C):   25.2 ml 16.35 ml/m LA Biplane Vol: 30.5 ml 19.79 ml/m  AORTIC VALVE                    PULMONIC VALVE AV Area (Vmax):    2.95 cm     PV Vmax:       0.85 m/s AV Area (Vmean):   2.77 cm     PV Peak grad:  2.9 mmHg AV Area (VTI):     3.08 cm AV Vmax:           132.00 cm/s AV Vmean:          94.600 cm/s AV VTI:            0.219 m AV Peak Grad:      7.0 mmHg AV Mean Grad:      4.0 mmHg LVOT Vmax:         124.00 cm/s LVOT Vmean:        83.300 cm/s LVOT VTI:          0.215 m LVOT/AV VTI ratio: 0.98  AORTA Ao Root diam: 2.90 cm Ao Asc diam:  2.70 cm MITRAL VALVE                TRICUSPID VALVE MV Area (PHT): 4.01 cm     TR Peak grad:   28.9 mmHg MV Area VTI:   3.14 cm     TR Vmax:        269.00 cm/s MV Peak grad:  5.0 mmHg MV Mean grad:  2.0 mmHg     SHUNTS MV Vmax:       1.12 m/s     Systemic VTI:  0.22 m MV Vmean:      56.9 cm/s    Systemic Diam: 2.00 cm MV Decel Time: 189 msec MV E velocity: 66.50 cm/s MV A velocity: 101.00 cm/s MV E/A ratio:  0.66 Stanly Leavens MD Electronically signed by Stanly Leavens MD Signature Date/Time: 02/09/2024/5:33:39 AM    Final     ASSESSMENT & PLAN:  Jacqueline Hancock is a 83 y.o. 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   1.  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 Though discussed with patient that FISH was neg and so Mantle cell lymphoma not confirm. Findings more consistent with CLL.   2.  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   PLAN: --Due for  cycle 6, day 1 of rituximab  and polatuzumab this Friday on 03/29/2025 --Reviewed labs from today with patient. WBC 3.4, Hgb 10.9, Plt 107K, creatinine 3.10, LFTs normal --Discussed rising creatinine levels with Dr. Autumn (on-call oncologist) and pharmacy with no dose modifications at this time with rising  creatinine levels.  --Patient is currently on torsemide  which could be causing her rising creatinine levels. Advised to follow up with cardiology as patient was advised to take as needed per record review.  --Continue to stay hydrated drinking 1-2 L of water per day and monitor closely for fluid overload.   FOLLOW-UP PET scan scheduled for 04/12/2024 with follow up with Dr. Onesimo one week later.    All of the patient's questions were answered and the patient knows to call the clinic with any problems, questions, or concerns.   I have spent a total of 30 minutes minutes of face-to-face and non-face-to-face time, preparing to see the patient, performing a medically appropriate examination, counseling and educating the patient, documenting clinical information in the electronic health record, independently interpreting results and communicating results to the patient, and care coordination.   Johnston Police PA-C Dept of Hematology and Oncology [facility] Phone: [phone]  "

## 2024-03-29 ENCOUNTER — Inpatient Hospital Stay

## 2024-03-29 VITALS — BP 130/61 | HR 87 | Temp 98.1°F | Resp 16 | Wt 127.4 lb

## 2024-03-29 DIAGNOSIS — C8338 Diffuse large B-cell lymphoma, lymph nodes of multiple sites: Secondary | ICD-10-CM

## 2024-03-29 DIAGNOSIS — C9110 Chronic lymphocytic leukemia of B-cell type not having achieved remission: Secondary | ICD-10-CM | POA: Diagnosis not present

## 2024-03-29 MED ORDER — SODIUM CHLORIDE 0.9 % IV SOLN: INTRAVENOUS | Status: AC

## 2024-03-29 MED ORDER — DIPHENHYDRAMINE HCL 25 MG PO CAPS
50.0000 mg | ORAL_CAPSULE | Freq: Once | ORAL | Status: AC
  Administered 2024-03-29: 50 mg via ORAL
  Filled 2024-03-29: qty 2

## 2024-03-29 MED ORDER — SODIUM CHLORIDE 0.9 % IV SOLN
1.4000 mg/kg | Freq: Once | INTRAVENOUS | Status: AC
  Administered 2024-03-29: 90 mg via INTRAVENOUS
  Filled 2024-03-29: qty 4.5

## 2024-03-29 MED ORDER — DEXAMETHASONE SOD PHOSPHATE PF 10 MG/ML IJ SOLN
10.0000 mg | Freq: Once | INTRAMUSCULAR | Status: AC
  Administered 2024-03-29: 10 mg via INTRAVENOUS

## 2024-03-29 MED ORDER — ACETAMINOPHEN 325 MG PO TABS
650.0000 mg | ORAL_TABLET | Freq: Once | ORAL | Status: AC
  Administered 2024-03-29: 650 mg via ORAL
  Filled 2024-03-29: qty 2

## 2024-03-29 MED ORDER — SODIUM CHLORIDE 0.9 % IV SOLN
375.0000 mg/m2 | Freq: Once | INTRAVENOUS | Status: AC
  Administered 2024-03-29: 600 mg via INTRAVENOUS
  Filled 2024-03-29: qty 50

## 2024-03-29 NOTE — Patient Instructions (Signed)
 [facility] - A DEPT OF [facility]  Discharge Instructions: Thank you for choosing [facility] to provide your oncology and hematology care.   If you have a lab appointment with the Cancer Center, please go directly to the Cancer Center and check in at the registration area.   Wear comfortable clothing and clothing appropriate for easy access to any Portacath or PICC line.   We strive to give you quality time with your provider. You may need to reschedule your appointment if you arrive late (15 or more minutes).  Arriving late affects you and other patients whose appointments are after yours.  Also, if you miss three or more appointments without notifying the office, you may be dismissed from the clinic at the provider's discretion.      For prescription refill requests, have your pharmacy contact our office and allow 72 hours for refills to be completed.    Today you received the following chemotherapy and/or immunotherapy agents: Rituximab -pvvr (Ruxience ) & Polatuzumab vedotin -piiq (Polivy )    To help prevent nausea and vomiting after your treatment, we encourage you to take your nausea medication as directed.  BELOW ARE SYMPTOMS THAT SHOULD BE REPORTED IMMEDIATELY: *FEVER GREATER THAN 100.4 F (38 C) OR HIGHER *CHILLS OR SWEATING *NAUSEA AND VOMITING THAT IS NOT CONTROLLED WITH YOUR NAUSEA MEDICATION *UNUSUAL SHORTNESS OF BREATH *UNUSUAL BRUISING OR BLEEDING *URINARY PROBLEMS (pain or burning when urinating, or frequent urination) *BOWEL PROBLEMS (unusual diarrhea, constipation, pain near the anus) TENDERNESS IN MOUTH AND THROAT WITH OR WITHOUT PRESENCE OF ULCERS (sore throat, sores in mouth, or a toothache) UNUSUAL RASH, SWELLING OR PAIN  UNUSUAL VAGINAL DISCHARGE OR ITCHING   Items with * indicate a potential emergency and should be followed up as soon as possible or go to the Emergency Department if any problems should occur.  Please  show the CHEMOTHERAPY ALERT CARD or IMMUNOTHERAPY ALERT CARD at check-in to the Emergency Department and triage nurse.  Should you have questions after your visit or need to cancel or reschedule your appointment, please contact [facility] - A DEPT OF [facility]  Dept: [phone]  and follow the prompts.  Office hours are 8:00 a.m. to 4:30 p.m. Monday - Friday. Please note that voicemails left after 4:00 p.m. may not be returned until the following business day.  We are closed weekends and major holidays. You have access to a nurse at all times for urgent questions. Please call the main number to the clinic Dept: [phone] and follow the prompts.   For any non-urgent questions, you may also contact your provider using MyChart. We now offer e-Visits for anyone 74 and older to request care online for non-urgent symptoms. For details visit [email or web address].   Also download the MyChart app! Go to the app store, search MyChart, open the app, select , and log in with your MyChart username and password.

## 2024-04-01 ENCOUNTER — Encounter: Payer: Self-pay | Admitting: Hematology

## 2024-04-01 ENCOUNTER — Inpatient Hospital Stay

## 2024-04-01 DIAGNOSIS — C9110 Chronic lymphocytic leukemia of B-cell type not having achieved remission: Secondary | ICD-10-CM | POA: Diagnosis not present

## 2024-04-01 DIAGNOSIS — C8338 Diffuse large B-cell lymphoma, lymph nodes of multiple sites: Secondary | ICD-10-CM

## 2024-04-01 MED ORDER — PEGFILGRASTIM-CBQV 6 MG/0.6ML ~~LOC~~ SOSY
6.0000 mg | PREFILLED_SYRINGE | Freq: Once | SUBCUTANEOUS | Status: AC
  Administered 2024-04-01: 6 mg via SUBCUTANEOUS
  Filled 2024-04-01: qty 0.6

## 2024-04-04 ENCOUNTER — Encounter: Payer: Self-pay | Admitting: Hematology

## 2024-04-08 ENCOUNTER — Other Ambulatory Visit (HOSPITAL_COMMUNITY): Payer: Self-pay

## 2024-04-08 ENCOUNTER — Encounter: Payer: Self-pay | Admitting: Internal Medicine

## 2024-04-08 ENCOUNTER — Other Ambulatory Visit: Payer: Self-pay

## 2024-04-08 ENCOUNTER — Encounter: Payer: Self-pay | Admitting: Hematology

## 2024-04-08 ENCOUNTER — Ambulatory Visit: Admitting: Internal Medicine

## 2024-04-08 ENCOUNTER — Telehealth: Payer: Self-pay

## 2024-04-08 VITALS — BP 153/72 | HR 83 | Temp 98.5°F

## 2024-04-08 DIAGNOSIS — B181 Chronic viral hepatitis B without delta-agent: Secondary | ICD-10-CM | POA: Diagnosis not present

## 2024-04-08 DIAGNOSIS — K219 Gastro-esophageal reflux disease without esophagitis: Secondary | ICD-10-CM

## 2024-04-08 DIAGNOSIS — Z79899 Other long term (current) drug therapy: Secondary | ICD-10-CM

## 2024-04-08 DIAGNOSIS — C8338 Diffuse large B-cell lymphoma, lymph nodes of multiple sites: Secondary | ICD-10-CM

## 2024-04-08 DIAGNOSIS — B009 Herpesviral infection, unspecified: Secondary | ICD-10-CM | POA: Diagnosis not present

## 2024-04-08 MED ORDER — PANTOPRAZOLE SODIUM 40 MG PO TBEC: 40.0000 mg | DELAYED_RELEASE_TABLET | Freq: Every day | ORAL | 5 refills | Status: AC

## 2024-04-08 MED ORDER — ALLOPURINOL 300 MG PO TABS
150.0000 mg | ORAL_TABLET | Freq: Every day | ORAL | 1 refills | Status: DC
  Filled 2024-04-08: qty 30, 60d supply, fill #0

## 2024-04-08 MED ORDER — ENTECAVIR 0.5 MG PO TABS: 0.5000 mg | ORAL_TABLET | ORAL | 3 refills | Status: AC

## 2024-04-08 MED ORDER — ACYCLOVIR 400 MG PO TABS: 400.0000 mg | ORAL_TABLET | Freq: Two times a day (BID) | ORAL | 5 refills | Status: AC

## 2024-04-08 NOTE — Progress Notes (Signed)
 "      Patient ID: Jacqueline Hancock, female   DOB: [birth year], 84 y.o.   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 Last year was found to have chronic hep B started on entecavir  Q 72hr, renally adjusted. She is accompanied by her son, however, it is her other son that was caring for her last week so he is unsure which meds she is taking. She reports doing well. No fevers, chills, no episodes of RUQ pain. No jaundice.  Needs refills of pantoprazole  and acyclovir   Outpatient Encounter Medications as of 04/08/2024  Medication Sig   acyclovir  (ZOVIRAX ) 400 MG tablet Take 1 tablet (400 mg total) by mouth 2 (two) times daily.   allopurinol  (ZYLOPRIM ) 300 MG tablet Take 0.5 tablets (150 mg total) by mouth daily.   dexamethasone  (DECADRON ) 4 MG tablet Take 2 tablets (8 mg total) by mouth daily. Start the day after bendamustine chemotherapy for 2 days. Take with food.   entecavir  (BARACLUDE ) 0.5 MG tablet Take 1 tablet (0.5 mg total) by mouth every 3 (three) days.   JARDIANCE  10 MG TABS tablet Take 1 tablet (10 mg total) by mouth daily before breakfast.   lidocaine -prilocaine  (EMLA ) cream Apply to affected area once   magnesium  oxide (MAG-OX) 400 (240 Mg) MG tablet Take 1 tablet (400 mg total) by mouth daily.   ondansetron  (ZOFRAN ) 8 MG tablet Take 8 mg by mouth every 8 (eight) hours as needed for nausea or vomiting.   pantoprazole  (PROTONIX ) 40 MG tablet Take 1 tablet (40 mg total) by mouth 2 (two) times daily for 56 days, THEN 1 tablet (40 mg total) daily.   polyethylene glycol (MIRALAX  / GLYCOLAX ) 17 g packet Take 17 g by mouth daily as needed for mild constipation or moderate constipation. Also available OTC   potassium chloride  SA (KLOR-CON  M) 20 MEQ tablet Take 1 tablet (20 mEq total) by mouth daily. Take twice a day for 3 days then once daily   prochlorperazine  (COMPAZINE ) 10 MG tablet Take 1 tablet (10  mg total) by mouth every 6 (six) hours as needed for nausea or vomiting.   torsemide  (DEMADEX ) 20 MG tablet Take 1 tablet (20 mg total) by mouth as needed. For weight gain of 3 lb in 24 hours or 5 lb in a week.   traMADol  (ULTRAM ) 50 MG tablet Take 1 tablet (50 mg total) by mouth every 8 (eight) hours as needed for moderate pain (pain score 4-6) or severe pain (pain score 7-10) (pain).   aspirin  81 MG chewable tablet Chew 1 tablet (81 mg total) by mouth daily. (Patient not taking: Reported on 04/08/2024)   ondansetron  (ZOFRAN ) 8 MG tablet Take 1 tablet (8 mg total) by mouth every 8 (eight) hours as needed for nausea or vomiting. Start on the third day after chemotherapy.   [DISCONTINUED] gabapentin  (NEURONTIN ) 100 MG capsule Take 1 capsule 3 x /day for Neuropathy Pain (Patient not taking: Reported on 01/25/2020)   Facility-Administered Encounter Medications as of 04/08/2024  Medication   0.9 %  sodium chloride  infusion     Patient Active Problem List   Diagnosis Date Noted   Positive double stranded DNA antibody test 11/06/2023   Chronic combined systolic and diastolic heart failure (HCC) 10/16/2023   Anemia 10/08/2023   Non-ST elevation (NSTEMI) myocardial infarction (HCC) 10/07/2023   Acute respiratory failure with hypoxia and hypercapnia (HCC)  10/07/2023   Acute systolic heart failure (HCC) 10/07/2023   Hypercapnic respiratory failure (HCC) 10/06/2023   Port-A-Cath in place 09/12/2023   Generalized weakness 09/02/2023   Stricture and stenosis of esophagus 08/31/2023   Multiple duodenal ulcers 08/31/2023   Dysphagia 08/30/2023   Malnutrition of moderate degree 08/29/2023   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   Leucocytosis 08/28/2023   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   Perinephric abdominal mass 08/03/2023   Hyperkalemia 08/03/2023   Hypercalcemia 06/15/2023   Traumatic perinephric hematoma of right side, initial encounter 06/15/2023   ILD  (interstitial lung disease) (HCC) 06/15/2023   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   Weakness 06/15/2023   Kidney laceration, right 06/15/2023   Atherosclerosis of aorta 02/07/2023   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   Obesity (BMI 30.0-34.9) 08/06/2020   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   Chronic kidney disease, stage 3b (HCC) 09/10/2015   Vitamin D  deficiency 06/20/2013   Medication management 06/20/2013   Hypertension    Hyperlipidemia    Abnormal glucose    GERD (gastroesophageal reflux disease)    Osteopenia      Health Maintenance Due  Topic Date Due   Medicare Annual Wellness (AWV)  Never done   Zoster Vaccines- Shingrix (1 of 2) 07/16/1959   COVID-19 Vaccine (4 - 2025-26 season) 12/04/2023     Review of Systems 12 point ros is otherwise negative Physical Exam   BP (!) 153/72   Pulse 83   Temp 98.5 F (36.9 C) (Oral)   SpO2 99%    Physical Exam  Constitutional:  oriented to person, place, and time. appears well-developed and well-nourished. No distress.  HENT: [place]/AT, PERRLA, no scleral icterus Mouth/Throat: Oropharynx is clear and moist. No oropharyngeal exudate.  Cardiovascular: Normal rate, regular rhythm and normal heart sounds. Exam reveals no gallop and no friction rub.  No murmur heard.  Pulmonary/Chest: Effort normal and breath sounds normal. No respiratory distress.  has no wheezes.  Neck = supple, no nuchal rigidity Chest wall = no tenderness about her portacath Abdominal: Soft. Bowel sounds are normal.  exhibits no distension. There is no tenderness.  Lymphadenopathy: no cervical adenopathy. No axillary adenopathy Neurological: alert and oriented to person, place, and time.  Skin: Skin is warm and dry. No rash noted. No erythema.  Psychiatric: a normal mood and affect.  behavior is normal.   Lab Results  Component Value Date   HEPBSAB NON-REACTIVE 08/18/2023     CBC Lab Results  Component Value Date   WBC 3.4 (L) 03/27/2024   RBC 3.32 (L) 03/27/2024   HGB 10.9 (L) 03/27/2024   HCT 32.1 (L) 03/27/2024   PLT 107 (L) 03/27/2024   MCV 96.7 03/27/2024   MCH 32.8 03/27/2024   MCHC 34.0 03/27/2024   RDW 14.6 03/27/2024   LYMPHSABS 0.8 03/27/2024   MONOABS 0.4 03/27/2024   EOSABS 0.1 03/27/2024    BMET Lab Results  Component Value Date   NA 137 03/27/2024   K 4.8 03/27/2024   CL 106 03/27/2024   CO2 19 (L) 03/27/2024   GLUCOSE 95 03/27/2024   BUN 51 (H) 03/27/2024   CREATININE 3.10 (H) 03/27/2024   CALCIUM 9.3 03/27/2024   GFRNONAA 14 (L) 03/27/2024   GFRAA 52 (L) 08/07/2020      Assessment and Plan  Chronic hepatitis b = continue on entecavir  Reviewed dosing with her current kidney function -- 0.5mg  Q 72hr.  Gave refills for 6 months. 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

## 2024-04-08 NOTE — Telephone Encounter (Signed)
 Dr. Luiz placed future orders for labs. Confirmed with Landry Silvan, RN that cancer center will be able to draw at next appointment.   Raychell Holcomb, BSN, RN

## 2024-04-09 ENCOUNTER — Other Ambulatory Visit: Payer: Self-pay

## 2024-04-09 ENCOUNTER — Encounter: Payer: Self-pay | Admitting: Pharmacist

## 2024-04-11 ENCOUNTER — Encounter: Payer: Self-pay | Admitting: Hematology

## 2024-04-12 ENCOUNTER — Encounter (HOSPITAL_COMMUNITY)
Admission: RE | Admit: 2024-04-12 | Discharge: 2024-04-12 | Disposition: A | Source: Ambulatory Visit | Attending: Hematology

## 2024-04-12 ENCOUNTER — Other Ambulatory Visit: Payer: Self-pay

## 2024-04-12 DIAGNOSIS — C8338 Diffuse large B-cell lymphoma, lymph nodes of multiple sites: Secondary | ICD-10-CM | POA: Diagnosis present

## 2024-04-12 LAB — GLUCOSE, CAPILLARY: Glucose-Capillary: 90 mg/dL (ref 70–99)

## 2024-04-12 MED ORDER — FLUDEOXYGLUCOSE F - 18 (FDG) INJECTION
6.3500 | Freq: Once | INTRAVENOUS | Status: AC
  Administered 2024-04-12: 6.36 via INTRAVENOUS

## 2024-04-16 ENCOUNTER — Encounter: Payer: Self-pay | Admitting: Hematology

## 2024-04-17 ENCOUNTER — Ambulatory Visit (HOSPITAL_COMMUNITY): Admission: RE | Admit: 2024-04-17 | Source: Ambulatory Visit

## 2024-04-18 ENCOUNTER — Telehealth: Payer: Self-pay

## 2024-04-18 ENCOUNTER — Inpatient Hospital Stay

## 2024-04-18 ENCOUNTER — Other Ambulatory Visit (HOSPITAL_COMMUNITY): Payer: Self-pay

## 2024-04-18 ENCOUNTER — Inpatient Hospital Stay: Attending: Hematology | Admitting: Hematology

## 2024-04-18 VITALS — BP 144/72 | HR 75 | Temp 97.5°F | Resp 18 | Wt 132.9 lb

## 2024-04-18 DIAGNOSIS — Z95828 Presence of other vascular implants and grafts: Secondary | ICD-10-CM

## 2024-04-18 DIAGNOSIS — C8338 Diffuse large B-cell lymphoma, lymph nodes of multiple sites: Secondary | ICD-10-CM | POA: Diagnosis not present

## 2024-04-18 DIAGNOSIS — C9110 Chronic lymphocytic leukemia of B-cell type not having achieved remission: Secondary | ICD-10-CM

## 2024-04-18 LAB — CBC WITH DIFFERENTIAL (CANCER CENTER ONLY)
Abs Immature Granulocytes: 0.03 K/uL (ref 0.00–0.07)
Basophils Absolute: 0.1 K/uL (ref 0.0–0.1)
Basophils Relative: 1 %
Eosinophils Absolute: 0.5 K/uL (ref 0.0–0.5)
Eosinophils Relative: 7 %
HCT: 27.5 % — ABNORMAL LOW (ref 36.0–46.0)
Hemoglobin: 9.7 g/dL — ABNORMAL LOW (ref 12.0–15.0)
Immature Granulocytes: 0 %
Lymphocytes Relative: 12 %
Lymphs Abs: 0.8 K/uL (ref 0.7–4.0)
MCH: 33.2 pg (ref 26.0–34.0)
MCHC: 35.3 g/dL (ref 30.0–36.0)
MCV: 94.2 fL (ref 80.0–100.0)
Monocytes Absolute: 0.7 K/uL (ref 0.1–1.0)
Monocytes Relative: 10 %
Neutro Abs: 4.8 K/uL (ref 1.7–7.7)
Neutrophils Relative %: 70 %
Platelet Count: 206 K/uL (ref 150–400)
RBC: 2.92 MIL/uL — ABNORMAL LOW (ref 3.87–5.11)
RDW: 14.3 % (ref 11.5–15.5)
WBC Count: 6.8 K/uL (ref 4.0–10.5)
nRBC: 0 % (ref 0.0–0.2)

## 2024-04-18 LAB — CMP (CANCER CENTER ONLY)
ALT: 15 U/L (ref 0–44)
AST: 19 U/L (ref 15–41)
Albumin: 4.2 g/dL (ref 3.5–5.0)
Alkaline Phosphatase: 91 U/L (ref 38–126)
Anion gap: 14 (ref 5–15)
BUN: 51 mg/dL — ABNORMAL HIGH (ref 8–23)
CO2: 20 mmol/L — ABNORMAL LOW (ref 22–32)
Calcium: 9.3 mg/dL (ref 8.9–10.3)
Chloride: 102 mmol/L (ref 98–111)
Creatinine: 2.45 mg/dL — ABNORMAL HIGH (ref 0.44–1.00)
GFR, Estimated: 19 mL/min — ABNORMAL LOW
Glucose, Bld: 88 mg/dL (ref 70–99)
Potassium: 4.7 mmol/L (ref 3.5–5.1)
Sodium: 136 mmol/L (ref 135–145)
Total Bilirubin: 0.6 mg/dL (ref 0.0–1.2)
Total Protein: 6.4 g/dL — ABNORMAL LOW (ref 6.5–8.1)

## 2024-04-18 LAB — HEPATITIS B SURFACE ANTIBODY,QUALITATIVE: Hep B S Ab: NONREACTIVE

## 2024-04-18 MED ORDER — SODIUM CHLORIDE 0.9% FLUSH
10.0000 mL | Freq: Once | INTRAVENOUS | Status: AC
  Administered 2024-04-18: 10 mL

## 2024-04-18 MED ORDER — ZANUBRUTINIB 160 MG PO TABS: 160.0000 mg | ORAL_TABLET | Freq: Every day | ORAL | 1 refills | Status: DC

## 2024-04-18 NOTE — Progress Notes (Signed)
 " HEMATOLOGY ONCOLOGY PROGRESS NOTE  Date of service: 04/18/2024  Patient Care Team: Ngetich, Roxan BROCKS, NP as PCP - General (Family Medicine) Kate Lonni CROME, MD as PCP - Cardiology (Cardiology) Arlana Arnt, MD as Consulting Physician (Otolaryngology) Luis Purchase, MD as Consulting Physician (Gastroenterology) Raeanne, Shanda SQUIBB, MD (Inactive) as Consulting Physician (Obstetrics and Gynecology) Roz Anes, MD as Consulting Physician (Ophthalmology) Quinn Odor, [facility] (Inactive) as Pharmacist (Pharmacist) Onesimo Emaline Brink, MD as Consulting Physician (Hematology)  CHIEF COMPLAINT/PURPOSE OF CONSULTATION: Follow-up for 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  HISTORY OF PRESENTING ILLNESS: (05/11/2022) Jacqueline Hancock is a wonderful 84 y.o. female who is here for continued evaluation and management of NHL, unspecified body region. Patient was following up with Dr. Amadeo.    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 BM Bx Cyclin D1 IHC +ve byt FISH neg . She has presented with splenomegaly, anemia, and lymphocytosis. 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    Patient's current treatment is Imbruvica  280 mg. She was started on Imbruvica  560 mg on January 2022, which was then reduced to 280 mg in July 2023.    Patient was last seen by Dr. Amadeo on 02/10/2022 and she was doing well overall.    Patient reports she has been doing well overall since our last visit. She does complains of itchy skin rashes, mostly around the face and head, for around 2-3 weeks ago. Patient notes that she was started couple of new medications from other physicians, but she is unsure of the names. Patient notes that she was started on oxycodone  around 3-4 weeks ago. She discontinued oxycodone .    She notes that there is redness around her face, which is not new.    Patient is  currently taking Imbruvica  280 mg as prescribed and has been tolerating it well without any severe toxicities.    She is currently taking Iron supplement, calcium supplement, and Vitamin-D supplement.    She denies fever, chills, night sweats, unexpected weight loss, back pain, abdominal pain, chest pain, or leg swelling. Patient does complain of occasional abdominal pain.    Patient notes she had a surgery near her right neck/ear area to remove an enlarged lymph node couple years ago. She complains of mild occasional pain near the site.   SUMMARY OF ONCOLOGIC HISTORY: Oncology History  Diffuse large B-cell lymphoma of lymph nodes of multiple regions (HCC)  08/18/2023 Initial Diagnosis   Diffuse large B-cell lymphoma of lymph nodes of multiple regions (HCC)   08/23/2023 - 10/07/2023 Chemotherapy   Patient is on Treatment Plan : NON-HODGKINS LYMPHOMA R-CHOP q21d     12/15/2023 -  Chemotherapy   Patient is on Treatment Plan : NON-HODGKINS LYMPHOMA RELAPSED/ REFRACTORY DLBCL Polatuzumab D1 + Bendamustine D1,2 + Rituximab  D1 q21d x 6 cycles       INTERVAL HISTORY:  Jacqueline Hancock is a 84 y.o. 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 She is accompanied by her son today and is ambulating with a  she was last seen by me on 03/08/2024; at the time she mentioned experiencing mild intermittent pain in her left side. She noticed this was noticeable while taking deep breaths or applying pressure to the area. She also noted looser stools in the morning.  Today, she reports some lingering symptoms from a recent viral infection, namely a runny nose. She  denies any mucus production or fevers.   She reports she is eating well.   She denies any new infection issues, fevers/chills, drenching night sweats, abdominal pain, new bone pains, leg swelling, bleeding issues, SOB, or change in breathing.   REVIEW OF SYSTEMS:   10 Point review of systems of  done and is negative except as noted above.  MEDICAL HISTORY Past Medical History:  Diagnosis Date   CLL (chronic lymphocytic leukemia) (HCC)    Congestive heart failure (CHF) (HCC) 09/2023   Dyspnea 2021   with exersion    Early cataracts, bilateral    Elevated hemoglobin A1c    Fibroids    GERD (gastroesophageal reflux disease)    Hyperlipidemia    Hypertension    Lymphoma (HCC)    Osteopenia    Personal history of radiation therapy    11/15/2023 - 11/21/2023 Abdomen- Dr. Lauraine Golden    SURGICAL HISTORY Past Surgical History:  Procedure Laterality Date   ABDOMINAL HYSTERECTOMY     APPENDECTOMY     BALLOON DILATION N/A 08/31/2023   Procedure: BALLOON DILATION;  Surgeon: Charlanne Groom, MD;  Location: [facility];  Service: Gastroenterology;  Laterality: N/A;   CATARACT EXTRACTION, BILATERAL Bilateral 09/2017   Dr. 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   TONSILLECTOMY AND ADENOIDECTOMY      SOCIAL HISTORY Social History[1]  Social History   Social History Narrative   Not on file    SOCIAL DRIVERS OF HEALTH SDOH Screenings   Food Insecurity: No Food Insecurity (11/13/2023)  Housing: Low Risk (11/13/2023)  Transportation Needs: No Transportation Needs (11/13/2023)  Utilities: Not At Risk (11/13/2023)  Alcohol Screen: Low Risk (10/11/2023)  Depression (PHQ2-9): Low Risk (03/29/2024)  Financial Resource Strain: Low Risk (10/11/2023)  Social Connections: Socially Isolated (11/13/2023)  Tobacco Use: Low Risk (04/08/2024)  Health Literacy: Adequate Health Literacy (02/16/2023)     FAMILY HISTORY Family History  Problem Relation Age of Onset   Leukemia Mother    Hypertension Father    Diabetes Sister    Heart disease  Brother    Diabetes Brother    Diabetes Brother    Hypertension Son    Heart disease Son      ALLERGIES: is allergic to peanut (diagnostic) and nexium [esomeprazole magnesium ].  MEDICATIONS  Current Outpatient Medications  Medication Sig Dispense Refill   zanubrutinib  (BRUKINSA ) 160 MG tablet Take 1 tablet (160 mg total) by mouth daily. 30 tablet 1   acyclovir  (ZOVIRAX ) 400 MG tablet Take 1 tablet (400 mg total) by mouth 2 (two) times daily. 60 tablet 5   aspirin  81 MG chewable tablet Chew 1 tablet (81 mg total) by mouth daily. (Patient not taking: Reported on 04/08/2024) 90 tablet 3   dexamethasone  (DECADRON ) 4 MG tablet Take 2 tablets (8 mg total) by mouth daily. Start the day after bendamustine chemotherapy for 2 days. Take with food. 30 tablet 1   entecavir  (BARACLUDE ) 0.5 MG tablet Take 1 tablet (0.5 mg total) by mouth every 3 (three) days. 30 tablet 3   JARDIANCE  10 MG TABS tablet Take 1 tablet (10 mg total) by mouth daily before breakfast. 90 tablet 3   lidocaine -prilocaine  (EMLA ) cream Apply to affected area once 30 g 3   magnesium  oxide (MAG-OX) 400 (240  Mg) MG tablet Take 1 tablet (400 mg total) by mouth daily. 30 tablet 0   ondansetron  (ZOFRAN ) 8 MG tablet Take 8 mg by mouth every 8 (eight) hours as needed for nausea or vomiting.     ondansetron  (ZOFRAN ) 8 MG tablet Take 1 tablet (8 mg total) by mouth every 8 (eight) hours as needed for nausea or vomiting. Start on the third day after chemotherapy. 30 tablet 1   pantoprazole  (PROTONIX ) 40 MG tablet Take 1 tablet (40 mg total) by mouth daily. 30 tablet 5   polyethylene glycol (MIRALAX  / GLYCOLAX ) 17 g packet Take 17 g by mouth daily as needed for mild constipation or moderate constipation. Also available OTC 30 each 0   potassium chloride  SA (KLOR-CON  M) 20 MEQ tablet Take 1 tablet (20 mEq total) by mouth daily. Take twice a day for 3 days then once daily 60 tablet 0   prochlorperazine  (COMPAZINE ) 10 MG tablet Take 1 tablet (10 mg  total) by mouth every 6 (six) hours as needed for nausea or vomiting. 30 tablet 1   torsemide  (DEMADEX ) 20 MG tablet Take 1 tablet (20 mg total) by mouth as needed. For weight gain of 3 lb in 24 hours or 5 lb in a week. 90 tablet 1   traMADol  (ULTRAM ) 50 MG tablet Take 1 tablet (50 mg total) by mouth every 8 (eight) hours as needed for moderate pain (pain score 4-6) or severe pain (pain score 7-10) (pain). 20 tablet 0   No current facility-administered medications for this visit.   Facility-Administered Medications Ordered in Other Visits  Medication Dose Route Frequency Provider Last Rate Last Admin   0.9 %  sodium chloride  infusion   Intravenous Continuous Onesimo Emaline Brink, MD   Stopped at 03/29/24 1240    PHYSICAL EXAMINATION: ECOG PERFORMANCE STATUS: 2 - Symptomatic, <50% confined to bed VITALS: Vitals:   04/18/24 1008 04/18/24 1014  BP: (!) 156/64 (!) 144/72  Pulse: 75   Resp: 18   Temp: (!) 97.5 F (36.4 C)   SpO2: 100%    Filed Weights   04/18/24 1008  Weight: 132 lb 14.4 oz (60.3 kg)   Body mass index is 25.11 kg/m.  GENERAL: alert, in no acute distress and comfortable SKIN: no acute rashes, no significant lesions EYES: conjunctiva are pink and non-injected, sclera anicteric OROPHARYNX: MMM, no exudates, no oropharyngeal erythema or ulceration NECK: supple, no JVD LYMPH:  no palpable lymphadenopathy in the cervical, axillary or inguinal regions LUNGS: clear to auscultation b/l with normal respiratory effort HEART: regular rate & rhythm ABDOMEN:  normoactive bowel sounds , non tender, not distended, no hepatosplenomegaly Extremity: no pedal edema PSYCH: alert & oriented x 3 with fluent speech NEURO: no focal motor/sensory deficits  LABORATORY DATA:   I have reviewed the data as listed     Latest Ref Rng & Units 04/18/2024    9:22 AM 03/27/2024    9:38 AM 03/08/2024    9:13 AM  CBC EXTENDED  WBC 4.0 - 10.5 K/uL 6.8  3.4  3.0   RBC 3.87 - 5.11 MIL/uL 2.92   3.32  3.25   Hemoglobin 12.0 - 15.0 g/dL 9.7  89.0  89.3   HCT 63.9 - 46.0 % 27.5  32.1  31.4   Platelets 150 - 400 K/uL 206  107  130   NEUT# 1.7 - 7.7 K/uL 4.8  1.5  1.1   Lymph# 0.7 - 4.0 K/uL 0.8  0.8  1.2  Latest Ref Rng & Units 04/18/2024    9:22 AM 03/27/2024    9:38 AM 03/08/2024    9:13 AM  CMP  Glucose 70 - 99 mg/dL 88  95  87   BUN 8 - 23 mg/dL 51  51  47   Creatinine 0.44 - 1.00 mg/dL 7.54  6.89  7.48   Sodium 135 - 145 mmol/L 136  137  140   Potassium 3.5 - 5.1 mmol/L 4.7  4.8  4.1   Chloride 98 - 111 mmol/L 102  106  109   CO2 22 - 32 mmol/L 20  19  21    Calcium 8.9 - 10.3 mg/dL 9.3  9.3  9.3   Total Protein 6.5 - 8.1 g/dL 6.4  6.5  6.2   Total Bilirubin 0.0 - 1.2 mg/dL 0.6  0.6  0.4   Alkaline Phos 38 - 126 U/L 91  96  82   AST 15 - 41 U/L 19  23  21    ALT 0 - 44 U/L 15  16  15       PATHOLOGY Surgical Pathology CASE: [id number] PATIENT: Katerine Casebeer Bone Marrow Report     Clinical History: lymphocytosis, possible lymphoma     DIAGNOSIS:  BONE MARROW, ASPIRATE, CLOT, CORE: -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  The latter is primarily seen by flow cytometry.  The findings favor mantle cell lymphoma but correlation with cytogenetic and FISH studies is recommended.  MICROSCOPIC DESCRIPTION:  PERIPHERAL BLOOD SMEAR: The red blood cells display prominent anisocytosis with macrocytic and normocytic cells.  There is mild to moderate poikilocytosis with teardrop cells, elliptocytes.  There is moderate polychromasia.  The white blood cells are increased in number with lymphocytosis characterized by predominance of small  to medium sized lymphoid cells displaying high nuclear cytoplasmic ratio, round to irregular nuclei, coarse chromatin and small to inconspicuous nucleoli. The platelets are normal in number.      RADIOGRAPHIC STUDIES: I have personally reviewed the radiological images as listed and agreed with the findings in the report. NM PET Image Restag (PS) Skull Base To Thigh Result Date: 04/17/2024 EXAM: PET AND CT SKULL BASE TO MID THIGH 04/12/2024 10:56:34 AM TECHNIQUE: RADIOPHARMACEUTICAL: 6.36 mCi F-18 FDG Uptake time 60 minutes. Glucose level 90 mg/dl. Blood pool SUV 1.9; background hepatic parenchymal activity 3.0. PET imaging was acquired from the base of the skull to the mid thighs. Non-contrast enhanced computed tomography was obtained for attenuation correction and anatomic localization. COMPARISON: 02/13/2024 CLINICAL HISTORY: Hematologic malignancy, assess treatment response; Interval assessment of response to treatment for large B cell lymphoma/Richter's transformation. FINDINGS: HEAD AND NECK: No metabolically active cervical lymphadenopathy. CHEST: Port-a-cath tip: right atrium. Mild atheromatous vascular calcification of the aortic arch. Mild cardiomegaly. Interstitial accentuation in the lungs favoring the lung bases, similar to prior exams. No metabolically active pulmonary nodules. No metabolically active lymphadenopathy. ABDOMEN AND PELVIS: Similar size and appearance of the partially calcified right renal/retroperitoneal mass with continued obscuration of the right adrenal gland and much of the right renal contours, compatible with treated lymphoma. Measurement of the mass separate from the right kidney is difficult/problematic, and likely best accomplished in the suprarenal region where a representative portion of the mass on image 106 series 4 has a maximum  SUV of 2.7 (Deauville 3), previously 2.9. There are also retroperitoneal components of the mass extending below the right kidney in the  perirenal space with speckle calcifications, with a representative region with maximum SUV 2.4 (Deauville 3), previously 2.4. Systemic atherosclerosis is present, including the aorta and iliac arteries. No metabolically active intraperitoneal mass. No metabolically active lymphadenopathy. Physiologic activity within the gastrointestinal and genitourinary systems. BONES AND SOFT TISSUE: Patchy but diffusely accentuated marrow activity, probably from granulocyte stimulation. No metabolically active aggressive osseous lesion. IMPRESSION: 1. Similar size and appearance of the partially calcified right renal/retroperitoneal mass with persistent obscuration of the right adrenal gland and much of the right renal contours, compatible with treated lymphoma, with maximum SUV 2.7 (Deauville 3), previously 2.9 when measured in the same fashion to exclude renal parenchyma. 2. Patchy but diffusely accentuated marrow activity, likely from granulocyte stimulation. 3. Port-a-cath tip in the right atrium. 4. Systemic atherosclerosis, including the aorta and iliac arteries. 5. Mild cardiomegaly. 6. Similar interstitial accentuation in the lung bases. Electronically signed by: Ryan Salvage MD 04/17/2024 01:53 PM EST RP Workstation: [id number]   NM PET Image Restag (PS) Skull Base To Thigh Result Date: 02/15/2024 CLINICAL DATA:  Subsequent treatment strategy for large B-cell lymphoma. EXAM: NUCLEAR MEDICINE PET SKULL BASE TO THIGH TECHNIQUE: 6.28 mCi F-18 FDG was injected intravenously. Full-ring PET imaging was performed from the skull base to thigh after the radiotracer. CT data was obtained and used for attenuation correction and anatomic localization. Fasting blood glucose: 80 mg/dl COMPARISON:  CT of the chest, abdomen and pelvis 12/19/2023. PET-CT 10/19/2023. FINDINGS: Mediastinal blood pool activity: SUV max 2.4 Liver activity: SUV max 2.9 NECK: No hypermetabolic cervical lymph nodes are identified. No suspicious  activity identified within the pharyngeal mucosal space. Incidental CT findings: none CHEST: There are no hypermetabolic mediastinal, hilar or axillary lymph nodes. No hypermetabolic pulmonary activity or suspicious nodularity. Incidental CT findings: Right IJ Port-A-Cath extends into the upper right atrium. Mild aortic atherosclerosis. Stable calcified left lower lobe granuloma and interstitial lung disease. ABDOMEN/PELVIS: There is no hypermetabolic activity within the liver, adrenal glands, spleen or pancreas. Previously demonstrated large, partially calcified right retroperitoneal mass involving the right kidney shows further decrease in size, now measuring approximately 0.3 x 4.9 cm on image 99/4 (9.5 x 6.5 cm on most recent CT). There is residual low level metabolic activity within this mass, some of which may reflect collecting system activity in the kidney (currently with an SUV max of 6.1, previously 21.8). No other more hypermetabolic or enlarging lymph nodes are identified within the abdomen or pelvis. Specifically, the previously demonstrated hypermetabolic right pelvic sidewall and right inguinal adenopathy has resolved. The enlarging lymph nodes seen on the most recent CT have all significantly improved, without significant residual hypermetabolic activity. Incidental CT findings: Aortic and branch vessel atherosclerosis. No evidence of hydronephrosis, bowel obstruction or ascites. SKELETON: There is no hypermetabolic activity to suggest osseous metastatic disease. Incidental CT findings: Mild spondylosis. IMPRESSION: 1. Interval response to therapy with further decrease in size of the large right retroperitoneal mass involving the right kidney. There is residual low level metabolic activity within this mass, some of which may reflect collecting system activity in the kidney. Deauville 3/4. 2. Interval resolution of the enlarged abdominopelvic lymph nodes on recent diagnostic CT, without hypermetabolic  activity. Deauville 1. 3. No new or progressive disease identified. 4.  Aortic Atherosclerosis (ICD10-I70.0). Electronically Signed   By: Elsie Perone M.D.   On: 02/15/2024 16:26   ECHOCARDIOGRAM  COMPLETE Result Date: 02/09/2024    ECHOCARDIOGRAM REPORT   Patient Name:   Jacqueline Hancock Date of Exam: 02/08/2024 Medical Rec #:  [id number]   Height:       61.0 in Accession #:    [id number]  Weight:       124.0 lb Date of Birth:  10-11-1940   BSA:          1.541 m Patient Age:    83 years    BP:           143/63 mmHg Patient Gender: F           HR:           84 bpm. Exam Location:  Outpatient Procedure: 2D Echo (Both Spectral and Color Flow Doppler were utilized during            procedure). Indications:    CHF  History:        Patient has prior history of Echocardiogram examinations. CHF.  Sonographer:    Norleen Amour Referring Phys: [id number] ALMA L DIAZ IMPRESSIONS  1. Apical displacement of papillary muscle without associated mitral regurgitation and without HCM. Similar to prior studies. Left ventricular ejection fraction, by estimation, is 60 to 65%. The left ventricle has normal function. The left ventricle has  no regional wall motion abnormalities. Left ventricular diastolic parameters are consistent with Grade I diastolic dysfunction (impaired relaxation).  2. Right ventricular systolic function is normal. The right ventricular size is normal. There is normal pulmonary artery systolic pressure.  3. The mitral valve is normal in structure. Trivial mitral valve regurgitation.  4. The aortic valve is tricuspid. Aortic valve regurgitation is not visualized.  5. Cannot exclude a small PFO. FINDINGS  Left Ventricle: Apical displacement of papillary muscle without associated mitral regurgitation and without HCM. Similar to prior studies. Left ventricular ejection fraction, by estimation, is 60 to 65%. The left ventricle has normal function. The left ventricle has no regional wall motion abnormalities. The left  ventricular internal cavity size was normal in size. There is no left ventricular hypertrophy. Left ventricular diastolic parameters are consistent with Grade I diastolic dysfunction (impaired  relaxation). Right Ventricle: The right ventricular size is normal. No increase in right ventricular wall thickness. Right ventricular systolic function is normal. There is normal pulmonary artery systolic pressure. The tricuspid regurgitant velocity is 2.69 m/s, and  with an assumed right atrial pressure of 3 mmHg, the estimated right ventricular systolic pressure is 31.9 mmHg. Left Atrium: Left atrial size was normal in size. Right Atrium: Right atrial size was normal in size. Pericardium: There is no evidence of pericardial effusion. Mitral Valve: The mitral valve is normal in structure. Trivial mitral valve regurgitation. MV peak gradient, 5.0 mmHg. The mean mitral valve gradient is 2.0 mmHg. Tricuspid Valve: The tricuspid valve is normal in structure. Tricuspid valve regurgitation is trivial. Aortic Valve: The aortic valve is tricuspid. Aortic valve regurgitation is not visualized. Aortic valve mean gradient measures 4.0 mmHg. Aortic valve peak gradient measures 7.0 mmHg. Aortic valve area, by VTI measures 3.08 cm. Pulmonic Valve: The pulmonic valve was normal in structure. Pulmonic valve regurgitation is not visualized. Aorta: The aortic root and ascending aorta are structurally normal, with no evidence of dilitation. IAS/Shunts: Cannot exclude a small PFO.  LEFT VENTRICLE PLAX 2D LVIDd:         4.50 cm     Diastology LVIDs:         2.90 cm  LV e' medial:    6.75 cm/s LV PW:         0.90 cm     LV E/e' medial:  9.9 LV IVS:        0.70 cm     LV e' lateral:   8.06 cm/s LVOT diam:     2.00 cm     LV E/e' lateral: 8.3 LV SV:         68 LV SV Index:   44 LVOT Area:     3.14 cm LV IVRT:       74 msec  LV Volumes (MOD) LV vol d, MOD A2C: 37.1 ml LV vol d, MOD A4C: 66.7 ml LV vol s, MOD A2C: 17.8 ml LV vol s, MOD A4C:  24.1 ml LV SV MOD A2C:     19.3 ml LV SV MOD A4C:     66.7 ml LV SV MOD BP:      28.7 ml RIGHT VENTRICLE             IVC RV Basal diam:  2.50 cm     IVC diam: 0.60 cm RV S prime:     17.20 cm/s TAPSE (M-mode): 2.2 cm      PULMONARY VEINS                             Diastolic Velocity: 45.60 cm/s                             S/D Velocity:       1.40                             Systolic Velocity:  62.10 cm/s LEFT ATRIUM             Index        RIGHT ATRIUM          Index LA diam:        3.00 cm 1.95 cm/m   RA Area:     8.74 cm LA Vol (A2C):   34.3 ml 22.25 ml/m  RA Volume:   16.00 ml 10.38 ml/m LA Vol (A4C):   25.2 ml 16.35 ml/m LA Biplane Vol: 30.5 ml 19.79 ml/m  AORTIC VALVE                    PULMONIC VALVE AV Area (Vmax):    2.95 cm     PV Vmax:       0.85 m/s AV Area (Vmean):   2.77 cm     PV Peak grad:  2.9 mmHg AV Area (VTI):     3.08 cm AV Vmax:           132.00 cm/s AV Vmean:          94.600 cm/s AV VTI:            0.219 m AV Peak Grad:      7.0 mmHg AV Mean Grad:      4.0 mmHg LVOT Vmax:         124.00 cm/s LVOT Vmean:        83.300 cm/s LVOT VTI:          0.215 m LVOT/AV VTI ratio: 0.98  AORTA Ao Root diam: 2.90 cm Ao Asc diam:  2.70 cm MITRAL VALVE  TRICUSPID VALVE MV Area (PHT): 4.01 cm     TR Peak grad:   28.9 mmHg MV Area VTI:   3.14 cm     TR Vmax:        269.00 cm/s MV Peak grad:  5.0 mmHg MV Mean grad:  2.0 mmHg     SHUNTS MV Vmax:       1.12 m/s     Systemic VTI:  0.22 m MV Vmean:      56.9 cm/s    Systemic Diam: 2.00 cm MV Decel Time: 189 msec MV E velocity: 66.50 cm/s MV A velocity: 101.00 cm/s MV E/A ratio:  0.66 Stanly Leavens MD Electronically signed by Stanly Leavens MD Signature Date/Time: 02/09/2024/5:33:39 AM    Final     ASSESSMENT & PLAN:  84 y.o. female with  1.  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 Though discussed with patient that FISH was neg and so Mantle cell  lymphoma not confirm. Findings more consistent with CLL.   2.  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   3.  High risk of tumor lysis syndrome   4.  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   5) history of hypercalcemia - ? From dehydration vs recurrent lymphoma   6) Hepatitis B chronic active disease  PLAN: - Discussed lab results on 04/18/2024 in detail with patient: -Creatinine 2.4  -RBC at 9.7 -PET scan shows no active lymphoma -discussed treatment plan for maintenance with Richter's transformation: no treatment but close monitoring, single medication zanubrutinab (one pill once or twice per day) and possibly pairing it with Venetoclax -discussed using a humidfier in her room at night  -discussed coming off of 81mg  aspirin  -taking Allopurinol  off medication list  -recommends keeping a balanced diet and drinking 64 oz of water per day -RTC 4-6 weeks   FOLLOW-UP in 4-6 weeks for labs and follow-up with Dr. Onesimo.  The total time spent in the appointment was 32 minutes* .  All of the patient's questions were answered and the patient knows to call the clinic with any problems, questions, or concerns.  Emaline Onesimo MD MS AAHIVMS [facility] Hematology/Oncology Physician [facility]  *Total Encounter Time as defined by the Centers for Medicare and Medicaid Services includes, in addition to the face-to-face time of a patient visit (documented in the note above) non-face-to-face time: obtaining and reviewing outside history, ordering and reviewing medications, tests or procedures, care coordination (communications with other health care professionals or caregivers) and documentation in the medical record.  I, Alan Blowers, acting as a neurosurgeon for Emaline Onesimo, MD.,have documented all relevant documentation on the  behalf of Emaline Onesimo, MD,as directed by  Emaline Onesimo, MD while in the presence of Emaline Onesimo, MD.  I have reviewed the above documentation for accuracy and completeness, and I agree with the above.  Emaline Onesimo, MD     [1]  Social History Tobacco Use   Smoking status: Never    Passive exposure: Past   Smokeless tobacco: Never  Vaping Use   Vaping status: Never Used  Substance Use Topics   Alcohol use: No   Drug use: Never   "

## 2024-04-18 NOTE — Telephone Encounter (Signed)
 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  Labs from 04/18/24 (CBC and CMP) assessed, no interventions needed. Medication does not need to be dose adjusted based on patients creatinine. Prescription dose and frequency assessed for appropriateness.   Current medication list in Epic reviewed, no significant/ relevant DDIs with Brukinsa  identified. Patient reports on 04/08/24 that she is not taking aspirin .   Evaluated chart and no patient barriers to medication adherence noted.   Patient agreement for treatment documented in MD note on 04/18/2024.  Prescription has been e-scribed to the [facility] for benefits analysis and approval.  Oral Oncology Clinic will continue to follow for insurance authorization, copayment issues, initial counseling and start date.  Neha Waight, PharmD Hematology/Oncology Clinical Pharmacist [place] [facility] [phone] 04/18/2024 11:00 AM

## 2024-04-18 NOTE — Telephone Encounter (Signed)
 Oral Oncology Patient Advocate Encounter   Received notification that prior authorization for zanubrutinib  (BRUKINSA ) 160 MG tablet  is required.   PA submitted on 04/18/24 Key [id number] Status is pending     Lucie Lamer, CPhT [place]  [facility] Specialty Pharmacy Services Oncology Pharmacy Patient Advocate Specialist II [facility] [place] Phone: [phone]  Fax: [phone] [email or web address]

## 2024-04-19 ENCOUNTER — Other Ambulatory Visit (HOSPITAL_COMMUNITY): Payer: Self-pay

## 2024-04-19 ENCOUNTER — Telehealth: Payer: Self-pay

## 2024-04-19 ENCOUNTER — Encounter: Payer: Self-pay | Admitting: Hematology

## 2024-04-19 LAB — HEPATITIS B DNA, ULTRAQUANTITATIVE, PCR
HBV DNA SERPL PCR-ACNC: NOT DETECTED [IU]/mL
HBV DNA SERPL PCR-LOG IU: UNDETERMINED {Log_IU}/mL

## 2024-04-19 LAB — HEPATITIS B E ANTIBODY: Hep B E Ab: REACTIVE — AB

## 2024-04-19 LAB — HEPATITIS B E ANTIGEN: Hep B E Ag: NEGATIVE

## 2024-04-19 NOTE — Telephone Encounter (Signed)
 Oral Oncology Patient Advocate Encounter  Received notification that the request for prior authorization for Brukinsa  has been denied due to medical necessity.    Jacqueline Hancock is doing an appeal.  Jacqueline Hancock, CPhT [place]  [facility] Specialty Pharmacy Services Oncology Pharmacy Patient Advocate Specialist II Jacqueline Hancock Phone: [phone]  Fax: [phone] [email or web address]

## 2024-04-19 NOTE — Telephone Encounter (Signed)
 Oral Oncology Patient Advocate Encounter  Was successful in securing patient a $8000 grant from [facility] to provide copayment coverage for Brukinsa .  This will keep the out of pocket expense at $0.     [employer] ID: [id number]   The billing information is as follows and has been shared with [facility].    RxBin: [id number] PCN: [id number] Member ID: [id number] Group ID: [id number] Dates of Eligibility: 03/20/24 through 03/19/25  Fund:  Jackolyn Lucie Lamer, CPhT [place]  [facility] Specialty Pharmacy Services Oncology Pharmacy Patient Advocate Specialist II [facility] [place] Phone: [phone]  Fax: [phone] [email or web address]

## 2024-04-20 ENCOUNTER — Other Ambulatory Visit: Payer: Self-pay

## 2024-04-22 ENCOUNTER — Other Ambulatory Visit (HOSPITAL_COMMUNITY): Payer: Self-pay

## 2024-04-22 ENCOUNTER — Other Ambulatory Visit: Payer: Self-pay

## 2024-04-22 ENCOUNTER — Encounter: Payer: Self-pay | Admitting: Hematology

## 2024-04-22 ENCOUNTER — Telehealth: Payer: Self-pay

## 2024-04-22 MED ORDER — ACALABRUTINIB MALEATE 100 MG PO TABS
100.0000 mg | ORAL_TABLET | Freq: Every day | ORAL | 0 refills | Status: AC
  Filled 2024-04-22: qty 30, 30d supply, fill #0

## 2024-04-22 NOTE — Telephone Encounter (Addendum)
 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  Labs from 04/18/2024 (CMP, CrCl 16.56 mL/min, and CBC) assessed, no interventions needed. Prescription dose and frequency assessed for appropriateness.   Current medication list in Epic reviewed, since patient will be taking Calquence  tablets, the DDIs identified with proton-pump inhibitors and magnesium  oxide are not of concern.   Evaluated chart and no patient barriers to medication adherence noted.   Prescription has been e-scribed to the [facility] for benefits analysis and approval.  Oral Oncology Clinic will continue to follow for insurance authorization, copayment issues, initial counseling and start date.  Damien Henry, M.S. PharmD Candidate Class of 2026 [employer] 04/22/2024 10:39 AM Oral Oncology Clinic [phone]

## 2024-04-22 NOTE — Progress Notes (Signed)
 Patient counseled on calquence  in telephone visit on 04/22/24.   Odysseus Cada, PharmD Hematology/Oncology Clinical Pharmacist [place] [facility] [phone]

## 2024-04-22 NOTE — Telephone Encounter (Signed)
 Oral Oncology Patient Advocate Encounter  After completing a benefits investigation, prior authorization for acalabrutinib  maleate (CALQUENCE ) 100 MG tablet  is not required at this time through [facility].  Patient's copay is $0 with [employer].     Lucie Lamer, CPhT [place]  [facility] Specialty Pharmacy Services Oncology Pharmacy Patient Advocate Specialist II [facility] [place] Phone: [phone]  Fax: [phone] [email or web address]

## 2024-04-22 NOTE — Progress Notes (Signed)
 Specialty Pharmacy Initial Fill Coordination Note  Jacqueline Hancock is a 84 y.o. female contacted today regarding refills of specialty medication(s) Acalabrutinib  Maleate (CALQUENCE ) .  Patient requested Delivery  on 04/24/24  to verified address [address]   [place] [address]   Medication will be filled on 04/23/24.   Patient is aware of $0 copayment with [employer] [facility].  Lucie Lamer, CPhT [place]  [facility] Specialty Pharmacy Services Oncology Pharmacy Patient Advocate Specialist II [place] Phone: [phone]  Fax: [phone] [email or web address]

## 2024-04-22 NOTE — Progress Notes (Signed)
 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   Treatment goal: Control  Counseled patient on administration, dosing, side effects, monitoring, drug-food interactions, safe handling, storage, and disposal.  Patient will take Calquence  100 mg tablets, 1 tablet by mouth once daily, with or without food.  Calquence  start date: 04/25/2024  Adverse effects include but are not limited to: diarrhea, headache, dry skin, fever, change in electrolytes, CBC abnormalities.   Reviewed with patient importance of keeping a medication schedule and plan for any missed doses. No barriers to medication adherence identified.  Medication reconciliation performed and medication/allergy list updated.  Distress thermometer not completed during telephone visit as patient has been on previous lines of therapy.   Communication and Learning Assessment Primary learner: patient Barriers to learning: No barriers Preferred language: English Learning preferences: Listening Reading  All questions answered.  Patient voiced understanding and appreciation.   Medication education handout placed in mail for patient. Patient knows to call the office with questions or concerns. Oral Chemotherapy Clinic phone number provided to patient.   Damien Henry, M.S. PharmD Candidate Class of 2026 [employer]  04/22/2024   3:34 PM Oral Oncology Clinic [phone]

## 2024-04-23 NOTE — Patient Instructions (Signed)
 [facility] [place] - A DEPT OF [facility]  Thank you for choosing [facility] to provide your oncology/hematology care and for allowing us  to participate in your care today!  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   Treatment goal: Control  Medication handout has been provided.   **For oral cancer medication prescription refill requests, contact your pharmacy and they will contact our office if needed. Allow 5-7 days for refills to be completed by your specialty pharmacy.    Cancer Center General Instructions:  If you have an appointment at the [facility], please go directly to the Cancer Center and check in at the registration area.  We strive to give you quality time with your provider. You may need to reschedule your appointment if you arrive late (15 or more minutes).  Arriving late affects you and other patients whose appointments are after yours.  Also, if you miss three or more appointments without notifying the office, you may be dismissed from the clinic at the provider's discretion.      BELOW ARE SYMPTOMS THAT SHOULD BE REPORTED IMMEDIATELY: *FEVER GREATER THAN 100.4 F (38 C) OR HIGHER *CHILLS OR SWEATING *NAUSEA AND VOMITING THAT IS NOT CONTROLLED WITH YOUR NAUSEA MEDICATION *UNUSUAL SHORTNESS OF BREATH *UNUSUAL BRUISING OR BLEEDING *URINARY PROBLEMS (pain or burning when urinating, or frequent urination) *BOWEL PROBLEMS (unusual diarrhea, constipation, pain near the anus) TENDERNESS IN MOUTH AND THROAT WITH OR WITHOUT PRESENCE OF ULCERS (sore throat, sores in mouth, or a toothache) UNUSUAL RASH, SWELLING OR PAIN  UNUSUAL VAGINAL DISCHARGE OR ITCHING   Items with * indicate a potential emergency and should be followed up as soon as possible or go to the  Emergency Department if any problems should occur.  Please show the CHEMOTHERAPY ALERT CARD at check-in to the Emergency Department and triage nurse.  Should you have questions after your visit or need to cancel or reschedule your appointment, please contact [facility] [place] - A DEPT OF [facility]  [phone] and follow the prompts.  Office hours are 8:00 a.m. to 4:30 p.m. Monday - Friday. Please note that voicemails left after 4:00 p.m. may not be returned until the following business day.  We are closed weekends and major holidays. You have access to a nurse at all times for urgent questions. Please call the main number to the clinic [phone] and follow the prompts.  For any non-urgent questions, you may also contact your provider using MyChart. We now offer e-Visits for anyone 49 and older to request care online for non-urgent symptoms. For details visit [email or web address].   Also download the MyChart app! Go to the app store, search MyChart, open the app, select , and log in with your MyChart username and password.

## 2024-04-24 ENCOUNTER — Encounter: Payer: Self-pay | Admitting: Hematology

## 2024-04-30 ENCOUNTER — Encounter: Payer: Self-pay | Admitting: Hematology

## 2024-05-09 ENCOUNTER — Other Ambulatory Visit (HOSPITAL_COMMUNITY): Payer: Self-pay

## 2024-05-10 ENCOUNTER — Other Ambulatory Visit: Payer: Self-pay

## 2024-05-17 ENCOUNTER — Ambulatory Visit: Payer: Self-pay | Admitting: Family

## 2024-08-07 ENCOUNTER — Ambulatory Visit (HOSPITAL_COMMUNITY)

## 2024-10-22 ENCOUNTER — Inpatient Hospital Stay

## 2024-10-22 ENCOUNTER — Inpatient Hospital Stay: Admitting: Hematology
